# Patient Record
Sex: Male | Born: 1970
Health system: Southern US, Community
[De-identification: ages and names within clinical notes are randomized; demographics above are authoritative.]

## PROBLEM LIST (undated history)

## (undated) DIAGNOSIS — M1990 Unspecified osteoarthritis, unspecified site: Secondary | ICD-10-CM

## (undated) DIAGNOSIS — J45909 Unspecified asthma, uncomplicated: Secondary | ICD-10-CM

## (undated) DIAGNOSIS — E119 Type 2 diabetes mellitus without complications: Secondary | ICD-10-CM

## (undated) DIAGNOSIS — G4733 Obstructive sleep apnea (adult) (pediatric): Secondary | ICD-10-CM

## (undated) DIAGNOSIS — I10 Essential (primary) hypertension: Secondary | ICD-10-CM

## (undated) HISTORY — PX: HERNIA REPAIR: SHX51

---

## 1998-04-28 ENCOUNTER — Ambulatory Visit (HOSPITAL_COMMUNITY): Admission: RE | Admit: 1998-04-28 | Discharge: 1998-04-28 | Payer: Self-pay | Admitting: Sports Medicine

## 1998-05-12 ENCOUNTER — Ambulatory Visit (HOSPITAL_COMMUNITY): Admission: RE | Admit: 1998-05-12 | Discharge: 1998-05-12 | Payer: Self-pay | Admitting: Sports Medicine

## 1998-05-26 ENCOUNTER — Ambulatory Visit (HOSPITAL_COMMUNITY): Admission: RE | Admit: 1998-05-26 | Discharge: 1998-05-26 | Payer: Self-pay | Admitting: Sports Medicine

## 1999-03-03 ENCOUNTER — Emergency Department (HOSPITAL_COMMUNITY): Admission: EM | Admit: 1999-03-03 | Discharge: 1999-03-03 | Payer: Self-pay | Admitting: Emergency Medicine

## 1999-03-03 ENCOUNTER — Encounter: Payer: Self-pay | Admitting: Emergency Medicine

## 1999-04-04 ENCOUNTER — Emergency Department (HOSPITAL_COMMUNITY): Admission: EM | Admit: 1999-04-04 | Discharge: 1999-04-04 | Payer: Self-pay | Admitting: Emergency Medicine

## 2009-01-07 ENCOUNTER — Ambulatory Visit: Payer: Self-pay | Admitting: Emergency Medicine

## 2009-01-07 DIAGNOSIS — I10 Essential (primary) hypertension: Secondary | ICD-10-CM | POA: Insufficient documentation

## 2009-01-07 DIAGNOSIS — J45909 Unspecified asthma, uncomplicated: Secondary | ICD-10-CM | POA: Insufficient documentation

## 2009-01-21 ENCOUNTER — Ambulatory Visit: Payer: Self-pay | Admitting: Gastroenterology

## 2009-01-21 DIAGNOSIS — K219 Gastro-esophageal reflux disease without esophagitis: Secondary | ICD-10-CM | POA: Insufficient documentation

## 2009-01-21 DIAGNOSIS — E669 Obesity, unspecified: Secondary | ICD-10-CM

## 2009-01-21 DIAGNOSIS — R1319 Other dysphagia: Secondary | ICD-10-CM | POA: Insufficient documentation

## 2009-02-13 ENCOUNTER — Ambulatory Visit: Payer: Self-pay | Admitting: Emergency Medicine

## 2009-02-27 ENCOUNTER — Encounter (INDEPENDENT_AMBULATORY_CARE_PROVIDER_SITE_OTHER): Payer: Self-pay | Admitting: *Deleted

## 2009-02-27 ENCOUNTER — Ambulatory Visit: Payer: Self-pay | Admitting: Gastroenterology

## 2009-02-27 ENCOUNTER — Telehealth (INDEPENDENT_AMBULATORY_CARE_PROVIDER_SITE_OTHER): Payer: Self-pay | Admitting: *Deleted

## 2009-02-27 ENCOUNTER — Ambulatory Visit (HOSPITAL_COMMUNITY): Admission: RE | Admit: 2009-02-27 | Discharge: 2009-02-27 | Payer: Self-pay | Admitting: Gastroenterology

## 2009-02-27 ENCOUNTER — Encounter: Payer: Self-pay | Admitting: Gastroenterology

## 2009-02-28 ENCOUNTER — Encounter: Payer: Self-pay | Admitting: Gastroenterology

## 2009-05-14 ENCOUNTER — Ambulatory Visit: Payer: Self-pay | Admitting: Gastroenterology

## 2011-02-10 LAB — GLUCOSE, CAPILLARY: Glucose-Capillary: 91 mg/dL (ref 70–99)

## 2015-06-11 DIAGNOSIS — R0781 Pleurodynia: Secondary | ICD-10-CM | POA: Insufficient documentation

## 2015-06-11 DIAGNOSIS — J9601 Acute respiratory failure with hypoxia: Secondary | ICD-10-CM | POA: Insufficient documentation

## 2015-06-11 DIAGNOSIS — E871 Hypo-osmolality and hyponatremia: Secondary | ICD-10-CM

## 2015-06-11 DIAGNOSIS — E1165 Type 2 diabetes mellitus with hyperglycemia: Secondary | ICD-10-CM | POA: Insufficient documentation

## 2015-06-11 DIAGNOSIS — J189 Pneumonia, unspecified organism: Secondary | ICD-10-CM | POA: Insufficient documentation

## 2015-06-11 DIAGNOSIS — B9561 Methicillin susceptible Staphylococcus aureus infection as the cause of diseases classified elsewhere: Secondary | ICD-10-CM

## 2015-06-11 DIAGNOSIS — A419 Sepsis, unspecified organism: Secondary | ICD-10-CM | POA: Diagnosis present

## 2015-06-11 DIAGNOSIS — J45909 Unspecified asthma, uncomplicated: Secondary | ICD-10-CM | POA: Insufficient documentation

## 2015-06-13 DIAGNOSIS — Q2112 Patent foramen ovale: Secondary | ICD-10-CM | POA: Insufficient documentation

## 2015-06-16 DIAGNOSIS — J984 Other disorders of lung: Secondary | ICD-10-CM | POA: Insufficient documentation

## 2015-06-17 DIAGNOSIS — M25521 Pain in right elbow: Secondary | ICD-10-CM | POA: Insufficient documentation

## 2019-06-18 ENCOUNTER — Encounter (HOSPITAL_COMMUNITY): Payer: Self-pay

## 2019-06-18 ENCOUNTER — Other Ambulatory Visit: Payer: Self-pay

## 2019-06-18 ENCOUNTER — Emergency Department (HOSPITAL_COMMUNITY)
Admission: EM | Admit: 2019-06-18 | Discharge: 2019-06-18 | Disposition: A | Discharge status: Home / Self Care | Payer: Self-pay | Attending: Emergency Medicine | Admitting: Emergency Medicine

## 2019-06-18 ENCOUNTER — Emergency Department (HOSPITAL_COMMUNITY): Payer: Self-pay

## 2019-06-18 DIAGNOSIS — N3000 Acute cystitis without hematuria: Secondary | ICD-10-CM | POA: Insufficient documentation

## 2019-06-18 DIAGNOSIS — Z79899 Other long term (current) drug therapy: Secondary | ICD-10-CM | POA: Insufficient documentation

## 2019-06-18 DIAGNOSIS — M5416 Radiculopathy, lumbar region: Secondary | ICD-10-CM | POA: Insufficient documentation

## 2019-06-18 DIAGNOSIS — I10 Essential (primary) hypertension: Secondary | ICD-10-CM | POA: Insufficient documentation

## 2019-06-18 DIAGNOSIS — E1165 Type 2 diabetes mellitus with hyperglycemia: Secondary | ICD-10-CM | POA: Insufficient documentation

## 2019-06-18 DIAGNOSIS — Z880 Allergy status to penicillin: Secondary | ICD-10-CM | POA: Insufficient documentation

## 2019-06-18 DIAGNOSIS — R109 Unspecified abdominal pain: Secondary | ICD-10-CM

## 2019-06-18 DIAGNOSIS — R739 Hyperglycemia, unspecified: Secondary | ICD-10-CM

## 2019-06-18 HISTORY — DX: Essential (primary) hypertension: I10

## 2019-06-18 HISTORY — DX: Unspecified osteoarthritis, unspecified site: M19.90

## 2019-06-18 LAB — CBC
HCT: 42.3 % (ref 39.0–52.0)
Hemoglobin: 14 g/dL (ref 13.0–17.0)
MCH: 30.2 pg (ref 26.0–34.0)
MCHC: 33.1 g/dL (ref 30.0–36.0)
MCV: 91.2 fL (ref 80.0–100.0)
Platelets: 187 10*3/uL (ref 150–400)
RBC: 4.64 MIL/uL (ref 4.22–5.81)
RDW: 12.3 % (ref 11.5–15.5)
WBC: 10 10*3/uL (ref 4.0–10.5)
nRBC: 0 % (ref 0.0–0.2)

## 2019-06-18 LAB — URINALYSIS, ROUTINE W REFLEX MICROSCOPIC
Bilirubin Urine: NEGATIVE
Glucose, UA: >=500 mg/dL — AB
Ketones, ur: NEGATIVE mg/dL
Nitrite: NEGATIVE
Protein, ur: 100 mg/dL — AB
RBC / HPF: >50 RBC/hpf — ABNORMAL HIGH (ref 0–5)
Specific Gravity, Urine: 1.03 (ref 1.005–1.030)
WBC, UA: >50 WBC/hpf — ABNORMAL HIGH (ref 0–5)
pH: 5 (ref 5.0–8.0)

## 2019-06-18 LAB — CBG MONITORING, ED: Glucose-Capillary: 345 mg/dL — ABNORMAL HIGH (ref 70–99)

## 2019-06-18 LAB — BASIC METABOLIC PANEL
Anion gap: 12 (ref 5–15)
BUN: 17 mg/dL (ref 6–20)
CO2: 26 mmol/L (ref 22–32)
Calcium: 9.8 mg/dL (ref 8.9–10.3)
Chloride: 93 mmol/L — ABNORMAL LOW (ref 98–111)
Creatinine, Ser: 0.91 mg/dL (ref 0.61–1.24)
GFR calc Af Amer: >60 mL/min (ref >60)
GFR calc non Af Amer: >60 mL/min (ref >60)
Glucose, Bld: 455 mg/dL — ABNORMAL HIGH (ref 70–99)
Potassium: 4.5 mmol/L (ref 3.5–5.1)
Sodium: 131 mmol/L — ABNORMAL LOW (ref 135–145)

## 2019-06-18 MED ORDER — ACETAMINOPHEN 325 MG PO TABS
650.0000 mg | ORAL_TABLET | Freq: Once | ORAL | Status: AC
  Administered 2019-06-18: 650 mg via ORAL
  Filled 2019-06-18: qty 2

## 2019-06-18 MED ORDER — SODIUM CHLORIDE 0.9 % IV BOLUS: 1000.0000 mL | Freq: Once | INTRAVENOUS | Status: DC

## 2019-06-18 MED ORDER — BLOOD GLUCOSE MONITOR KIT: PACK | 0 refills | Status: DC

## 2019-06-18 MED ORDER — SODIUM CHLORIDE 0.9 % IV BOLUS
1000.0000 mL | Freq: Once | INTRAVENOUS | Status: AC
  Administered 2019-06-18: 17:00:00 1000 mL via INTRAVENOUS

## 2019-06-18 MED ORDER — CIPROFLOXACIN HCL 500 MG PO TABS: 500.0000 mg | ORAL_TABLET | Freq: Two times a day (BID) | ORAL | 0 refills | Status: AC

## 2019-06-18 MED ORDER — METFORMIN HCL 500 MG PO TABS: 500.0000 mg | ORAL_TABLET | Freq: Two times a day (BID) | ORAL | 0 refills | Status: DC

## 2019-06-18 MED ORDER — CIPROFLOXACIN IN D5W 400 MG/200ML IV SOLN
400.0000 mg | Freq: Once | INTRAVENOUS | Status: AC
  Administered 2019-06-18: 400 mg via INTRAVENOUS
  Filled 2019-06-18: qty 200

## 2019-06-18 NOTE — ED Triage Notes (Signed)
Pt endorses flank pain, blood in urine and was seen Saturday and [facility] Saturday and given antibiotics and told if not better today to go to the ER. Pt was fine until this morning and began having flank pain radiating down the right leg. VSS.

## 2019-06-18 NOTE — ED Provider Notes (Signed)
[facility]ENC[facility] DEPARTMENT Provider Note   CSN: [id number] Arrival date & time: 06/18/19  1141    History   Chief Complaint Chief Complaint  Patient presents with  . Flank Pain    HPI Bradley Cox is a 48 y.o. male.     48 year old male with history of kidney stones and hypertension presents with complaint of right low back pain.  Patient states that he developed hematuria on Saturday, went to see his PCP and was given a pain shot, hematuria then resolved.  Patient woke up at 2:00 this morning with pain in his right lower back, radiates to his right knee, worse with movement.  Denies abdominal pain, leg weakness, groin numbness.  Denies any falls or injuries.  No other complaints or concerns today.     Past Medical History:  Diagnosis Date  . Arthritis   . Hypertension     Patient Active Problem List   Diagnosis Date Noted  . OBESITY 01/21/2009  . GERD 01/21/2009  . DYSPHAGIA 01/21/2009  . HYPERTENSION 01/07/2009  . ASTHMA 01/07/2009    History reviewed. No pertinent surgical history.      Home Medications    Prior to Admission medications   Medication Sig Start Date End Date Taking? Authorizing Provider  albuterol (VENTOLIN HFA) 108 (90 Base) MCG/ACT inhaler Inhale 1 puff into the lungs as needed for wheezing or shortness of breath.   Yes [provider]  hydrALAZINE (APRESOLINE) 50 MG tablet Take 50 mg by mouth 2 (two) times daily. 06/21/15  Yes [provider]  ibuprofen (ADVIL) 200 MG tablet Take 600 mg by mouth every 6 (six) hours as needed for headache or moderate pain.   Yes [provider]  Multiple Vitamin (THERA) TABS Take 2 tablets by mouth daily.   Yes [provider]  blood glucose meter kit and supplies KIT Dispense based on patient and insurance preference. Use up to four times daily as directed. (FOR ICD-9 250.00, 250.01). 06/18/19   Tacy Learn, PA-C  ciprofloxacin (CIPRO) 500 MG  tablet Take 1 tablet (500 mg total) by mouth every 12 (twelve) hours for 7 days. 06/18/19 06/25/19  Tacy Learn, PA-C  metFORMIN (GLUCOPHAGE) 500 MG tablet Take 1 tablet (500 mg total) by mouth 2 (two) times daily with a meal. 06/18/19 07/18/19  Tacy Learn, PA-C    Family History History reviewed. No pertinent family history.  Social History Social History   Tobacco Use  . Smoking status: Never Smoker  . Smokeless tobacco: Current User    Types: Snuff  Substance Use Topics  . Alcohol use: Yes    Comment: rarely  . Drug use: Never     Allergies   Penicillins   Review of Systems Review of Systems  Constitutional: Negative for chills and fever.  Gastrointestinal: Negative for abdominal pain, constipation, diarrhea, nausea and vomiting.  Genitourinary: Positive for hematuria. Negative for dysuria, frequency and urgency.  Musculoskeletal: Positive for back pain.  Skin: Negative for rash and wound.  Allergic/Immunologic: Negative for immunocompromised state.  Neurological: Negative for weakness.  Psychiatric/Behavioral: Negative for confusion.  All other systems reviewed and are negative.    Physical Exam Updated Vital Signs BP (!) 160/100   Pulse (!) 110   Temp 100.1 F (37.8 C) (Oral)   Resp 18   SpO2 93%   Physical Exam Vitals signs and nursing note reviewed.  Constitutional:      General: He is not in acute  distress.    Appearance: He is well-developed. He is obese. He is not diaphoretic.  HENT:     Head: Normocephalic and atraumatic.  Cardiovascular:     Rate and Rhythm: Normal rate and regular rhythm.     Pulses: Normal pulses.     Heart sounds: Normal heart sounds.  Pulmonary:     Effort: Pulmonary effort is normal.     Breath sounds: Normal breath sounds.  Abdominal:     Tenderness: There is no abdominal tenderness. There is no right CVA tenderness or left CVA tenderness.  Musculoskeletal:        General: Tenderness present.       Back:      Right lower leg: No edema.     Left lower leg: No edema.  Skin:    General: Skin is warm and dry.     Findings: No erythema or rash.  Neurological:     Mental Status: He is alert and oriented to person, place, and time.     Sensory: Sensation is intact. No sensory deficit.     Gait: Gait normal.     Deep Tendon Reflexes: Reflexes normal.     Reflex Scores:      Patellar reflexes are 1+ on the right side and 1+ on the left side. Psychiatric:        Behavior: Behavior normal.      ED Treatments / Results  Labs (all labs ordered are listed, but only abnormal results are displayed) Labs Reviewed  URINALYSIS, ROUTINE W REFLEX MICROSCOPIC - Abnormal; Notable for the following components:      Result Value   Color, Urine AMBER (*)    APPearance CLOUDY (*)    Glucose, UA >=500 (*)    Hgb urine dipstick LARGE (*)    Protein, ur 100 (*)    Leukocytes,Ua LARGE (*)    RBC / HPF >50 (*)    WBC, UA >50 (*)    Bacteria, UA RARE (*)    All other components within normal limits  BASIC METABOLIC PANEL - Abnormal; Notable for the following components:   Sodium 131 (*)    Chloride 93 (*)    Glucose, Bld 455 (*)    All other components within normal limits  CBG MONITORING, ED - Abnormal; Notable for the following components:   Glucose-Capillary 345 (*)    All other components within normal limits  URINE CULTURE  CBC    EKG None  Radiology Ct Renal Stone Study  Result Date: 06/18/2019 CLINICAL DATA:  Right flank pain radiating into right leg. Began Friday. Also having hematuria EXAM: CT ABDOMEN AND PELVIS WITHOUT CONTRAST TECHNIQUE: Multidetector CT imaging of the abdomen and pelvis was performed following the standard protocol without IV contrast. COMPARISON:  None. FINDINGS: Lower chest: Minimal scarring at both lung bases. There is coronary artery calcification of the visualized portion of the heart. Hepatobiliary: Liver is diffusely low attenuation, consistent with hepatic  steatosis. Rounded contour the liver and prominent caudate lobe raise the question of cirrhosis. The gallbladder is contracted and contains numerous calcifications, likely representing calcified stones. Pancreas: Unremarkable. No pancreatic ductal dilatation or surrounding inflammatory changes. Spleen: Normal in size without focal abnormality. Adrenals/Urinary Tract: There are 2 intrarenal calculi in the mid and LOWER pole regions of the RIGHT kidney, largest measuring 0.6 centimeters. The LEFT kidney is unremarkable. There is mild stranding surrounding the RIGHT ureter, not associated with obstruction or ureteral stone. The LEFT ureter is unremarkable. Visualized portion  of the urinary bladder is unremarkable. Stomach/Bowel: The stomach and small bowel loops are normal in appearance. There are numerous colonic diverticula but no acute diverticulitis. Average stool burden. Normal appearance of the appendix. Vascular/Lymphatic: No significant vascular findings are present. No enlarged abdominal or pelvic lymph nodes. Reproductive: Prostate is unremarkable. Other: Fat containing bilateral inguinal hernias, RIGHT greater than LEFT. Musculoskeletal: Degenerative changes in the thoracic and lumbar spine. IMPRESSION: 1. Nonobstructing RIGHT intrarenal calculi. 2. Mild stranding surrounding the RIGHT ureter, without ureteral stone raising the question of a recently passed ureteral calculus. No bladder calculi identified. 3. Hepatic steatosis and possible cirrhosis. 4. Cholelithiasis.  Contracted gallbladder. 5. Colonic diverticulosis. 6. Fat containing bilateral inguinal hernias, RIGHT greater than LEFT. 7. Coronary artery disease. Electronically Signed   By: Nolon Nations M.D.   On: 06/18/2019 15:39    Procedures Procedures (including critical care time)  Medications Ordered in ED Medications  sodium chloride 0.9 % bolus 1,000 mL (has no administration in time range)  acetaminophen (TYLENOL) tablet 650 mg (650  mg Oral Given 06/18/19 1159)  sodium chloride 0.9 % bolus 1,000 mL (1,000 mLs Intravenous New Bag/Given 06/18/19 1638)  ciprofloxacin (CIPRO) IVPB 400 mg (400 mg Intravenous New Bag/Given 06/18/19 1640)     Initial Impression / Assessment and Plan / ED Course  I have reviewed the triage vital signs and the nursing notes.  Pertinent labs & imaging results that were available during my care of the patient were reviewed by me and considered in my medical decision making (see chart for details).  Clinical Course as of Jun 18 1943  Mon Jun 17, 6454  5549 48 year old male complaining of some right flank pain.  He was formally a diabetic although it sounds like he had it under control with dietary measures here with an elevated blood sugar 455.  Rest of his labs are fairly unremarkable the urinalysis is pending.  CT shows some inflammation in the right ureter quite possibly a passed stone.   [MB]  6665 48 year old male with complaint of right low back pain which radiates to his leg, pain reproduced with palpation.  Patient has a history of prior kidney stones, states this feels different however does report hematuria at onset.  Review of labs, glucose is 455, patient given IV fluids with improvement to 345, plan for additional IV fluids prior to discharge.  CBC is unremarkable, urinalysis with likely urinary tract infection, large hemoglobin, protein, leukocytes with greater than 50 white cells.  Sent for culture.  Patient be treated with Cipro.  Patient's triage temp was 100.1, repeat temp of 99.0.  Reviewed CT results with patient, recommend follow-up with PCP, discussed hepatic steatosis, cholelithiasis, renal stones.  Stranding on the ureter suggesting recently passed stone.  Patient given prescription for metformin and glucometer, reports his father has diabetes, he is familiar with tracking blood sugar.  Patient is to record blood sugar and take to PCP for follow-up.   [LM]    Clinical Course User  Index [LM] Tacy Learn, PA-C [MB] Hayden Rasmussen, MD      Final Clinical Impressions(s) / ED Diagnoses   Final diagnoses:  Flank pain  Radiculopathy of lumbar region  Hyperglycemia  Acute cystitis without hematuria    ED Discharge Orders         Ordered    metFORMIN (GLUCOPHAGE) 500 MG tablet  2 times daily with meals     06/18/19 1930    blood glucose meter kit and supplies KIT  06/18/19 1930    ciprofloxacin (CIPRO) 500 MG tablet  Every 12 hours     06/18/19 1930           Tacy Learn, PA-C 06/18/19 1944    Hayden Rasmussen, MD 06/19/19 [phone]

## 2019-06-18 NOTE — Discharge Instructions (Signed)
Take Cipro as prescribed and complete the full course.  Recheck with your doctor for repeat urinalysis. Take metformin twice daily as prescribed.  Monitor your blood sugar, take blood sugar log with you to your PCP for follow-up.  Be sure to drink plenty of water every day.

## 2019-06-21 LAB — URINE CULTURE: Culture: >=100000 — AB

## 2019-06-22 ENCOUNTER — Telehealth: Payer: Self-pay | Admitting: *Deleted

## 2019-06-22 NOTE — Telephone Encounter (Signed)
Post ED Visit - Positive Culture Follow-up  Culture report reviewed by antimicrobial stewardship pharmacist: [facility] Team []  Elenor Quinones, Pharm.D. []  Heide Guile, Pharm.D., BCPS AQ-ID []  Parks Neptune, Pharm.D., BCPS []  Alycia Rossetti, Pharm.D., BCPS []  [place], Pharm.D., BCPS, AAHIVP []  Legrand Como, Pharm.D., BCPS, AAHIVP []  Salome Arnt, PharmD, BCPS []  Johnnette Gourd, PharmD, BCPS []  Hughes Better, PharmD, BCPS []  Leeroy Cha, PharmD []  Laqueta Linden, PharmD, BCPS []  Albertina Parr, PharmD  [place] Team []  Leodis Sias, PharmD []  Lindell Spar, PharmD []  Royetta Asal, PharmD []  Graylin Shiver, Rph []  Rema Fendt) Glennon Mac, PharmD []  Arlyn Dunning, PharmD []  Netta Cedars, PharmD []  Dia Sitter, PharmD []  Leone Haven, PharmD []  Gretta Arab, PharmD []  Theodis Shove, PharmD []  Peggyann Juba, PharmD []  Reuel Boom, PharmD   Positive urine culture Treated with Ciprofloxacin, organism sensitive to the same and no further patient follow-up is required at this time.  Recommended f/u with PCP in one week. Harlon Flor [facility] 06/22/2019, 10:06 AM

## 2019-07-05 ENCOUNTER — Other Ambulatory Visit: Payer: Self-pay

## 2019-07-05 ENCOUNTER — Inpatient Hospital Stay (HOSPITAL_COMMUNITY)
Admission: EM | Admit: 2019-07-05 | Discharge: 2019-08-16 | DRG: 853 | Disposition: A | Discharge status: Rehabilitation Facility | Payer: Self-pay | Attending: Internal Medicine | Admitting: Internal Medicine

## 2019-07-05 DIAGNOSIS — E1110 Type 2 diabetes mellitus with ketoacidosis without coma: Secondary | ICD-10-CM | POA: Diagnosis present

## 2019-07-05 DIAGNOSIS — L0231 Cutaneous abscess of buttock: Secondary | ICD-10-CM | POA: Diagnosis present

## 2019-07-05 DIAGNOSIS — Z8249 Family history of ischemic heart disease and other diseases of the circulatory system: Secondary | ICD-10-CM

## 2019-07-05 DIAGNOSIS — J45909 Unspecified asthma, uncomplicated: Secondary | ICD-10-CM | POA: Diagnosis present

## 2019-07-05 DIAGNOSIS — K760 Fatty (change of) liver, not elsewhere classified: Secondary | ICD-10-CM | POA: Diagnosis present

## 2019-07-05 DIAGNOSIS — G061 Intraspinal abscess and granuloma: Secondary | ICD-10-CM | POA: Diagnosis present

## 2019-07-05 DIAGNOSIS — D638 Anemia in other chronic diseases classified elsewhere: Secondary | ICD-10-CM | POA: Diagnosis not present

## 2019-07-05 DIAGNOSIS — Z87442 Personal history of urinary calculi: Secondary | ICD-10-CM

## 2019-07-05 DIAGNOSIS — Z720 Tobacco use: Secondary | ICD-10-CM

## 2019-07-05 DIAGNOSIS — K311 Adult hypertrophic pyloric stenosis: Secondary | ICD-10-CM

## 2019-07-05 DIAGNOSIS — M549 Dorsalgia, unspecified: Secondary | ICD-10-CM

## 2019-07-05 DIAGNOSIS — E871 Hypo-osmolality and hyponatremia: Secondary | ICD-10-CM | POA: Diagnosis not present

## 2019-07-05 DIAGNOSIS — N179 Acute kidney failure, unspecified: Secondary | ICD-10-CM | POA: Diagnosis present

## 2019-07-05 DIAGNOSIS — E11649 Type 2 diabetes mellitus with hypoglycemia without coma: Secondary | ICD-10-CM | POA: Diagnosis not present

## 2019-07-05 DIAGNOSIS — Z833 Family history of diabetes mellitus: Secondary | ICD-10-CM

## 2019-07-05 DIAGNOSIS — E875 Hyperkalemia: Secondary | ICD-10-CM | POA: Diagnosis present

## 2019-07-05 DIAGNOSIS — Z6841 Body Mass Index (BMI) 40.0 and over, adult: Secondary | ICD-10-CM

## 2019-07-05 DIAGNOSIS — M4620 Osteomyelitis of vertebra, site unspecified: Secondary | ICD-10-CM

## 2019-07-05 DIAGNOSIS — A419 Sepsis, unspecified organism: Secondary | ICD-10-CM | POA: Diagnosis present

## 2019-07-05 DIAGNOSIS — B9561 Methicillin susceptible Staphylococcus aureus infection as the cause of diseases classified elsewhere: Secondary | ICD-10-CM

## 2019-07-05 DIAGNOSIS — G5701 Lesion of sciatic nerve, right lower limb: Secondary | ICD-10-CM | POA: Diagnosis not present

## 2019-07-05 DIAGNOSIS — R112 Nausea with vomiting, unspecified: Secondary | ICD-10-CM

## 2019-07-05 DIAGNOSIS — Z7984 Long term (current) use of oral hypoglycemic drugs: Secondary | ICD-10-CM

## 2019-07-05 DIAGNOSIS — K567 Ileus, unspecified: Secondary | ICD-10-CM | POA: Diagnosis not present

## 2019-07-05 DIAGNOSIS — D62 Acute posthemorrhagic anemia: Secondary | ICD-10-CM | POA: Diagnosis not present

## 2019-07-05 DIAGNOSIS — Z20828 Contact with and (suspected) exposure to other viral communicable diseases: Secondary | ICD-10-CM | POA: Diagnosis present

## 2019-07-05 DIAGNOSIS — I10 Essential (primary) hypertension: Secondary | ICD-10-CM | POA: Diagnosis present

## 2019-07-05 DIAGNOSIS — R109 Unspecified abdominal pain: Secondary | ICD-10-CM

## 2019-07-05 DIAGNOSIS — K219 Gastro-esophageal reflux disease without esophagitis: Secondary | ICD-10-CM | POA: Diagnosis present

## 2019-07-05 DIAGNOSIS — E1310 Other specified diabetes mellitus with ketoacidosis without coma: Secondary | ICD-10-CM

## 2019-07-05 DIAGNOSIS — L259 Unspecified contact dermatitis, unspecified cause: Secondary | ICD-10-CM | POA: Diagnosis not present

## 2019-07-05 DIAGNOSIS — L0291 Cutaneous abscess, unspecified: Secondary | ICD-10-CM | POA: Diagnosis present

## 2019-07-05 DIAGNOSIS — E878 Other disorders of electrolyte and fluid balance, not elsewhere classified: Secondary | ICD-10-CM | POA: Diagnosis not present

## 2019-07-05 DIAGNOSIS — K6812 Psoas muscle abscess: Secondary | ICD-10-CM | POA: Diagnosis present

## 2019-07-05 DIAGNOSIS — Z885 Allergy status to narcotic agent status: Secondary | ICD-10-CM

## 2019-07-05 DIAGNOSIS — M1990 Unspecified osteoarthritis, unspecified site: Secondary | ICD-10-CM | POA: Diagnosis present

## 2019-07-05 DIAGNOSIS — A4101 Sepsis due to Methicillin susceptible Staphylococcus aureus: Principal | ICD-10-CM | POA: Diagnosis present

## 2019-07-05 DIAGNOSIS — E876 Hypokalemia: Secondary | ICD-10-CM | POA: Diagnosis not present

## 2019-07-05 DIAGNOSIS — I82409 Acute embolism and thrombosis of unspecified deep veins of unspecified lower extremity: Secondary | ICD-10-CM | POA: Diagnosis not present

## 2019-07-05 DIAGNOSIS — L03115 Cellulitis of right lower limb: Secondary | ICD-10-CM | POA: Diagnosis not present

## 2019-07-05 DIAGNOSIS — G4733 Obstructive sleep apnea (adult) (pediatric): Secondary | ICD-10-CM | POA: Diagnosis present

## 2019-07-05 DIAGNOSIS — K7460 Unspecified cirrhosis of liver: Secondary | ICD-10-CM | POA: Diagnosis present

## 2019-07-05 DIAGNOSIS — Z880 Allergy status to penicillin: Secondary | ICD-10-CM

## 2019-07-05 DIAGNOSIS — R7881 Bacteremia: Secondary | ICD-10-CM

## 2019-07-05 DIAGNOSIS — L02415 Cutaneous abscess of right lower limb: Secondary | ICD-10-CM | POA: Diagnosis present

## 2019-07-05 DIAGNOSIS — Z0189 Encounter for other specified special examinations: Secondary | ICD-10-CM

## 2019-07-05 DIAGNOSIS — M009 Pyogenic arthritis, unspecified: Secondary | ICD-10-CM | POA: Diagnosis present

## 2019-07-05 DIAGNOSIS — N400 Enlarged prostate without lower urinary tract symptoms: Secondary | ICD-10-CM | POA: Diagnosis present

## 2019-07-05 HISTORY — DX: Obstructive sleep apnea (adult) (pediatric): G47.33

## 2019-07-05 HISTORY — DX: Morbid (severe) obesity due to excess calories: E66.01

## 2019-07-05 HISTORY — DX: Unspecified asthma, uncomplicated: J45.909

## 2019-07-05 HISTORY — DX: Type 2 diabetes mellitus without complications: E11.9

## 2019-07-05 MED ORDER — OXYCODONE-ACETAMINOPHEN 5-325 MG PO TABS
2.0000 | ORAL_TABLET | Freq: Once | ORAL | Status: DC
  Filled 2019-07-05: qty 2

## 2019-07-05 MED ORDER — OXYCODONE-ACETAMINOPHEN 5-325 MG PO TABS
2.0000 | ORAL_TABLET | Freq: Once | ORAL | Status: DC
  Administered 2019-07-05: 2 via ORAL

## 2019-07-05 MED ORDER — ONDANSETRON 4 MG PO TBDP
4.0000 mg | ORAL_TABLET | Freq: Once | ORAL | Status: DC
  Administered 2019-07-05: 4 mg via ORAL

## 2019-07-05 MED ORDER — ONDANSETRON 4 MG PO TBDP
4.0000 mg | ORAL_TABLET | Freq: Once | ORAL | Status: DC
  Filled 2019-07-05: qty 1

## 2019-07-05 NOTE — ED Notes (Signed)
Pt states he received a pain injection 2 weeks ago during his follow up appointment his MD states they injected him in his sciatica nerve. Pt also seen here for the same.

## 2019-07-05 NOTE — ED Triage Notes (Signed)
Pt presents with Right back pain radiating down his Right leg x3 weeks.

## 2019-07-06 ENCOUNTER — Emergency Department (HOSPITAL_COMMUNITY): Payer: Self-pay

## 2019-07-06 ENCOUNTER — Encounter (HOSPITAL_COMMUNITY): Payer: Self-pay | Admitting: Internal Medicine

## 2019-07-06 ENCOUNTER — Inpatient Hospital Stay (HOSPITAL_COMMUNITY): Payer: Self-pay

## 2019-07-06 DIAGNOSIS — E875 Hyperkalemia: Secondary | ICD-10-CM | POA: Diagnosis present

## 2019-07-06 DIAGNOSIS — N179 Acute kidney failure, unspecified: Secondary | ICD-10-CM

## 2019-07-06 DIAGNOSIS — A419 Sepsis, unspecified organism: Secondary | ICD-10-CM

## 2019-07-06 DIAGNOSIS — L0291 Cutaneous abscess, unspecified: Secondary | ICD-10-CM | POA: Diagnosis present

## 2019-07-06 LAB — BLOOD CULTURE ID PANEL (REFLEXED)

## 2019-07-06 LAB — CBC WITH DIFFERENTIAL/PLATELET
Abs Immature Granulocytes: 0.05 10*3/uL (ref 0.00–0.07)
Basophils Absolute: 0.1 10*3/uL (ref 0.0–0.1)
Basophils Relative: 1 %
Eosinophils Absolute: 0 10*3/uL (ref 0.0–0.5)
Eosinophils Relative: 0 %
HCT: 42.6 % (ref 39.0–52.0)
Hemoglobin: 14 g/dL (ref 13.0–17.0)
Immature Granulocytes: 1 %
Lymphocytes Relative: 8 %
Lymphs Abs: 0.8 10*3/uL (ref 0.7–4.0)
MCH: 31 pg (ref 26.0–34.0)
MCHC: 32.9 g/dL (ref 30.0–36.0)
MCV: 94.2 fL (ref 80.0–100.0)
Monocytes Absolute: 1 10*3/uL (ref 0.1–1.0)
Monocytes Relative: 9 %
Neutro Abs: 8.6 10*3/uL — ABNORMAL HIGH (ref 1.7–7.7)
Neutrophils Relative %: 81 %
Platelets: 256 10*3/uL (ref 150–400)
RBC: 4.52 MIL/uL (ref 4.22–5.81)
RDW: 12.9 % (ref 11.5–15.5)
WBC: 10.4 10*3/uL (ref 4.0–10.5)
nRBC: 0 % (ref 0.0–0.2)

## 2019-07-06 LAB — LACTIC ACID, PLASMA
Lactic Acid, Venous: 3 mmol/L (ref 0.5–1.9)
Lactic Acid, Venous: 2.2 mmol/L (ref 0.5–1.9)
Lactic Acid, Venous: 2.6 mmol/L (ref 0.5–1.9)

## 2019-07-06 LAB — I-STAT CHEM 8, ED
BUN: 17 mg/dL (ref 6–20)
Calcium, Ion: 1.16 mmol/L (ref 1.15–1.40)
Chloride: 96 mmol/L — ABNORMAL LOW (ref 98–111)
Creatinine, Ser: 0.6 mg/dL — ABNORMAL LOW (ref 0.61–1.24)
Glucose, Bld: 338 mg/dL — ABNORMAL HIGH (ref 70–99)
HCT: 37 % — ABNORMAL LOW (ref 39.0–52.0)
Hemoglobin: 12.6 g/dL — ABNORMAL LOW (ref 13.0–17.0)
Potassium: 4.6 mmol/L (ref 3.5–5.1)
Sodium: 132 mmol/L — ABNORMAL LOW (ref 135–145)
TCO2: 25 mmol/L (ref 22–32)

## 2019-07-06 LAB — GLUCOSE, CAPILLARY
Glucose-Capillary: 137 mg/dL — ABNORMAL HIGH (ref 70–99)
Glucose-Capillary: 141 mg/dL — ABNORMAL HIGH (ref 70–99)
Glucose-Capillary: 161 mg/dL — ABNORMAL HIGH (ref 70–99)

## 2019-07-06 LAB — BASIC METABOLIC PANEL
Anion gap: 11 (ref 5–15)
BUN: 14 mg/dL (ref 6–20)
CO2: 26 mmol/L (ref 22–32)
Calcium: 8.4 mg/dL — ABNORMAL LOW (ref 8.9–10.3)
Chloride: 95 mmol/L — ABNORMAL LOW (ref 98–111)
Creatinine, Ser: 0.75 mg/dL (ref 0.61–1.24)
GFR calc Af Amer: >60 mL/min (ref >60)
GFR calc non Af Amer: >60 mL/min (ref >60)
Glucose, Bld: 107 mg/dL — ABNORMAL HIGH (ref 70–99)
Potassium: 4.8 mmol/L (ref 3.5–5.1)
Sodium: 132 mmol/L — ABNORMAL LOW (ref 135–145)

## 2019-07-06 LAB — URINALYSIS, ROUTINE W REFLEX MICROSCOPIC
Bilirubin Urine: NEGATIVE
Glucose, UA: >=500 mg/dL — AB
Ketones, ur: 80 mg/dL — AB
Leukocytes,Ua: NEGATIVE
Nitrite: NEGATIVE
Protein, ur: NEGATIVE mg/dL
Specific Gravity, Urine: 1.024 (ref 1.005–1.030)
pH: 5 (ref 5.0–8.0)

## 2019-07-06 LAB — COMPREHENSIVE METABOLIC PANEL
ALT: 19 U/L (ref 0–44)
AST: 25 U/L (ref 15–41)
Albumin: 1.9 g/dL — ABNORMAL LOW (ref 3.5–5.0)
Alkaline Phosphatase: 84 U/L (ref 38–126)
Anion gap: 18 — ABNORMAL HIGH (ref 5–15)
BUN: 21 mg/dL — ABNORMAL HIGH (ref 6–20)
CO2: 21 mmol/L — ABNORMAL LOW (ref 22–32)
Calcium: 9.2 mg/dL (ref 8.9–10.3)
Chloride: 88 mmol/L — ABNORMAL LOW (ref 98–111)
Creatinine, Ser: 1.36 mg/dL — ABNORMAL HIGH (ref 0.61–1.24)
GFR calc Af Amer: >60 mL/min (ref >60)
GFR calc non Af Amer: >60 mL/min (ref >60)
Glucose, Bld: 524 mg/dL (ref 70–99)
Potassium: 6.1 mmol/L — ABNORMAL HIGH (ref 3.5–5.1)
Sodium: 127 mmol/L — ABNORMAL LOW (ref 135–145)
Total Bilirubin: 1 mg/dL (ref 0.3–1.2)
Total Protein: 7.7 g/dL (ref 6.5–8.1)

## 2019-07-06 LAB — CBG MONITORING, ED
Glucose-Capillary: 420 mg/dL — ABNORMAL HIGH (ref 70–99)
Glucose-Capillary: 370 mg/dL — ABNORMAL HIGH (ref 70–99)
Glucose-Capillary: 300 mg/dL — ABNORMAL HIGH (ref 70–99)
Glucose-Capillary: 311 mg/dL — ABNORMAL HIGH (ref 70–99)
Glucose-Capillary: 255 mg/dL — ABNORMAL HIGH (ref 70–99)
Glucose-Capillary: 197 mg/dL — ABNORMAL HIGH (ref 70–99)

## 2019-07-06 LAB — C-REACTIVE PROTEIN: CRP: 43.5 mg/dL — ABNORMAL HIGH (ref <1.0)

## 2019-07-06 LAB — SARS CORONAVIRUS 2 BY RT PCR (HOSPITAL ORDER, PERFORMED IN ~~LOC~~ HOSPITAL LAB): SARS Coronavirus 2: NEGATIVE

## 2019-07-06 MED ORDER — SODIUM CHLORIDE 0.9 % IV SOLN
2.0000 g | Freq: Three times a day (TID) | INTRAVENOUS | Status: DC
  Administered 2019-07-06 – 2019-07-07 (×2): 2 g via INTRAVENOUS
  Filled 2019-07-06 (×4): qty 2

## 2019-07-06 MED ORDER — ACETAMINOPHEN 500 MG PO TABS
1000.0000 mg | ORAL_TABLET | Freq: Once | ORAL | Status: AC
  Administered 2019-07-06: 1000 mg via ORAL
  Filled 2019-07-06: qty 2

## 2019-07-06 MED ORDER — FENTANYL CITRATE (PF) 100 MCG/2ML IJ SOLN
INTRAMUSCULAR | Status: AC | PRN
  Administered 2019-07-06 (×2): 25 ug via INTRAVENOUS
  Administered 2019-07-06: 50 ug via INTRAVENOUS

## 2019-07-06 MED ORDER — SODIUM CHLORIDE 0.9 % IV BOLUS (SEPSIS)
800.0000 mL | Freq: Once | INTRAVENOUS | Status: AC
  Administered 2019-07-06: 800 mL via INTRAVENOUS

## 2019-07-06 MED ORDER — ALBUTEROL SULFATE (2.5 MG/3ML) 0.083% IN NEBU: 2.5000 mg | INHALATION_SOLUTION | Freq: Four times a day (QID) | RESPIRATORY_TRACT | Status: DC | PRN

## 2019-07-06 MED ORDER — FENTANYL CITRATE (PF) 100 MCG/2ML IJ SOLN
INTRAMUSCULAR | Status: AC
  Filled 2019-07-06: qty 4

## 2019-07-06 MED ORDER — ONDANSETRON HCL 4 MG PO TABS: 4.0000 mg | ORAL_TABLET | Freq: Four times a day (QID) | ORAL | Status: DC | PRN

## 2019-07-06 MED ORDER — SODIUM CHLORIDE 0.9 % IV BOLUS (SEPSIS)
1000.0000 mL | Freq: Once | INTRAVENOUS | Status: AC
  Administered 2019-07-06: 1000 mL via INTRAVENOUS

## 2019-07-06 MED ORDER — SODIUM CHLORIDE 0.9 % IV SOLN
INTRAVENOUS | Status: AC | PRN
  Administered 2019-07-06: 10 mL/h via INTRAVENOUS

## 2019-07-06 MED ORDER — CLINDAMYCIN PHOSPHATE 600 MG/50ML IV SOLN: 600.0000 mg | Freq: Once | INTRAVENOUS | Status: DC

## 2019-07-06 MED ORDER — ACETAMINOPHEN 325 MG PO TABS: 650.0000 mg | ORAL_TABLET | Freq: Four times a day (QID) | ORAL | Status: DC | PRN

## 2019-07-06 MED ORDER — ONDANSETRON HCL 4 MG/2ML IJ SOLN
4.0000 mg | Freq: Four times a day (QID) | INTRAMUSCULAR | Status: DC | PRN
  Administered 2019-07-11: 21:00:00 4 mg via INTRAVENOUS
  Filled 2019-07-06: qty 2

## 2019-07-06 MED ORDER — VANCOMYCIN HCL 10 G IV SOLR
1500.0000 mg | Freq: Two times a day (BID) | INTRAVENOUS | Status: DC
  Administered 2019-07-07: 1500 mg via INTRAVENOUS
  Filled 2019-07-06 (×3): qty 1500

## 2019-07-06 MED ORDER — LORAZEPAM 2 MG/ML IJ SOLN
1.0000 mg | Freq: Once | INTRAMUSCULAR | Status: AC
  Administered 2019-07-06: 1 mg via INTRAVENOUS
  Filled 2019-07-06: qty 1

## 2019-07-06 MED ORDER — SODIUM CHLORIDE 0.9% FLUSH
3.0000 mL | Freq: Two times a day (BID) | INTRAVENOUS | Status: DC
  Administered 2019-07-07 – 2019-07-27 (×27): 3 mL via INTRAVENOUS

## 2019-07-06 MED ORDER — SODIUM CHLORIDE 0.9 % IV SOLN
2.0000 g | Freq: Once | INTRAVENOUS | Status: AC
  Administered 2019-07-06: 2 g via INTRAVENOUS
  Filled 2019-07-06: qty 2

## 2019-07-06 MED ORDER — ONDANSETRON HCL 4 MG/2ML IJ SOLN
4.0000 mg | Freq: Once | INTRAMUSCULAR | Status: AC
  Administered 2019-07-06: 4 mg via INTRAVENOUS
  Filled 2019-07-06: qty 2

## 2019-07-06 MED ORDER — SODIUM CHLORIDE 0.9 % IV SOLN
INTRAVENOUS | Status: DC
  Administered 2019-07-06: 23:00:00 via INTRAVENOUS

## 2019-07-06 MED ORDER — HYDROMORPHONE HCL 1 MG/ML IJ SOLN
2.0000 mg | Freq: Once | INTRAMUSCULAR | Status: AC
  Administered 2019-07-06: 2 mg via INTRAVENOUS
  Filled 2019-07-06: qty 2

## 2019-07-06 MED ORDER — ENOXAPARIN SODIUM 40 MG/0.4ML ~~LOC~~ SOLN
40.0000 mg | SUBCUTANEOUS | Status: DC
  Administered 2019-07-07: 40 mg via SUBCUTANEOUS
  Filled 2019-07-06: qty 0.4

## 2019-07-06 MED ORDER — MIDAZOLAM HCL 2 MG/2ML IJ SOLN
INTRAMUSCULAR | Status: AC | PRN
  Administered 2019-07-06: 1 mg via INTRAVENOUS
  Administered 2019-07-06 (×2): 0.5 mg via INTRAVENOUS

## 2019-07-06 MED ORDER — GADOBUTROL 1 MMOL/ML IV SOLN
10.0000 mL | Freq: Once | INTRAVENOUS | Status: AC | PRN
  Administered 2019-07-06: 10 mL via INTRAVENOUS

## 2019-07-06 MED ORDER — IOPAMIDOL (ISOVUE-300) INJECTION 61%
100.0000 mL | Freq: Once | INTRAVENOUS | Status: AC | PRN
  Administered 2019-07-06: 100 mL via INTRAVENOUS

## 2019-07-06 MED ORDER — DEXTROSE-NACL 5-0.45 % IV SOLN
INTRAVENOUS | Status: DC
  Administered 2019-07-07: 04:00:00 via INTRAVENOUS

## 2019-07-06 MED ORDER — VANCOMYCIN HCL 10 G IV SOLR
2000.0000 mg | Freq: Three times a day (TID) | INTRAVENOUS | Status: DC
  Filled 2019-07-06 (×2): qty 2000

## 2019-07-06 MED ORDER — HYDROMORPHONE HCL 1 MG/ML IJ SOLN
1.0000 mg | Freq: Once | INTRAMUSCULAR | Status: AC
  Administered 2019-07-06: 1 mg via INTRAVENOUS
  Filled 2019-07-06: qty 1

## 2019-07-06 MED ORDER — HYDRALAZINE HCL 50 MG PO TABS
50.0000 mg | ORAL_TABLET | Freq: Two times a day (BID) | ORAL | Status: DC
  Administered 2019-07-06 – 2019-07-07 (×2): 50 mg via ORAL
  Filled 2019-07-06 (×2): qty 1

## 2019-07-06 MED ORDER — VANCOMYCIN HCL 10 G IV SOLR
2500.0000 mg | Freq: Once | INTRAVENOUS | Status: AC
  Administered 2019-07-06: 2500 mg via INTRAVENOUS
  Filled 2019-07-06: qty 2500

## 2019-07-06 MED ORDER — SODIUM CHLORIDE 0.9 % IV BOLUS (SEPSIS)
1000.0000 mL | Freq: Once | INTRAVENOUS | Status: AC
  Administered 2019-07-06 (×2): 1000 mL via INTRAVENOUS

## 2019-07-06 MED ORDER — CLINDAMYCIN PHOSPHATE 600 MG/50ML IV SOLN
600.0000 mg | Freq: Three times a day (TID) | INTRAVENOUS | Status: DC
  Administered 2019-07-06 – 2019-07-07 (×2): 600 mg via INTRAVENOUS
  Filled 2019-07-06 (×3): qty 50

## 2019-07-06 MED ORDER — ACETAMINOPHEN 650 MG RE SUPP: 650.0000 mg | Freq: Four times a day (QID) | RECTAL | Status: DC | PRN

## 2019-07-06 MED ORDER — HYDROMORPHONE HCL 1 MG/ML IJ SOLN
1.0000 mg | INTRAMUSCULAR | Status: DC | PRN
  Administered 2019-07-06: 1 mg via INTRAVENOUS
  Filled 2019-07-06 (×3): qty 1

## 2019-07-06 MED ORDER — INSULIN REGULAR(HUMAN) IN NACL 100-0.9 UT/100ML-% IV SOLN
INTRAVENOUS | Status: DC
  Administered 2019-07-06: 3.6 [IU]/h via INTRAVENOUS
  Filled 2019-07-06 (×2): qty 100

## 2019-07-06 MED ORDER — MIDAZOLAM HCL 2 MG/2ML IJ SOLN
INTRAMUSCULAR | Status: AC
  Filled 2019-07-06: qty 4

## 2019-07-06 NOTE — Progress Notes (Addendum)
Pharmacy Antibiotic Note  Bradley Cox is a 48 y.o. male admitted on 07/05/2019 with sepsis.  Pharmacy has been consulted for vancomycin and cefepime dosing.  Plan: Vancomycin 2500 mg IV x 1 then 2000 mg IV q8 hours Cefepime 2gm IV q8 hours F/u renal function, cultures and clinical course  Height: 6\' 6"  (198.1 cm) Weight: (!) 369 lb (167.4 kg) IBW/kg (Calculated) : 91.4  Temp (24hrs), Avg:100.6 F (38.1 C), Min:99 F (37.2 C), Max:102.8 F (39.3 C)  No results for input(s): WBC, CREATININE, LATICACIDVEN, VANCOTROUGH, VANCOPEAK, VANCORANDOM, GENTTROUGH, GENTPEAK, GENTRANDOM, TOBRATROUGH, TOBRAPEAK, TOBRARND, AMIKACINPEAK, AMIKACINTROU, AMIKACIN in the last 168 hours.  Estimated Creatinine Clearance: 169.2 mL/min (by C-G formula based on SCr of 0.91 mg/dL).    Allergies  Allergen Reactions  . Penicillins Anaphylaxis    Did it involve swelling of the face/tongue/throat, SOB, or low BP? Yes Did it involve sudden or severe rash/hives, skin peeling, or any reaction on the inside of your mouth or nose? No Did you need to seek medical attention at a hospital or doctor's office? Yes When did it last happen?4 months old If all above answers are "NO", may proceed with cephalosporin use.      Thank you for allowing pharmacy to be a part of this patient's care.  Excell Seltzer P[place]et 07/06/2019 4:54 AM

## 2019-07-06 NOTE — Plan of Care (Signed)
  Problem: Clinical Measurements: Goal: Respiratory complications will improve Outcome: Progressing   Problem: Pain Managment: Goal: General experience of comfort will improve Outcome: Progressing   

## 2019-07-06 NOTE — Progress Notes (Signed)
Pharmacy Antibiotic Note  Bradley Cox is a 48 y.o. male admitted on 07/05/2019 with sepsis. Pharmacy has been consulted for vancomycin and cefepime dosing.  Pt is febrile and WBC is WNL. SCr is above patients baseline and lactic acid is also elevated.   Plan: Continue cefepime as ordered Change vancomycin to 1500mg  IV Q12H F/u renal fxn, C&S, clinical status and peak/trough at SS  Height: 6\' 6"  (198.1 cm) Weight: (!) 369 lb (167.4 kg) IBW/kg (Calculated) : 91.4  Temp (24hrs), Avg:100.6 F (38.1 C), Min:99 F (37.2 C), Max:102.8 F (39.3 C)  Recent Labs  Lab 07/06/19 0359 07/06/19 0526  WBC 10.4  --   CREATININE  --  1.36*  LATICACIDVEN 3.0*  --     Estimated Creatinine Clearance: 113.2 mL/min (A) (by C-G formula based on SCr of 1.36 mg/dL (H)).    Vanc 9/4>> Cefepime 9/4>> Clinda x 1 9/4  Thank you for allowing pharmacy to be a part of this patient's care.  Jullian Clayson, Rande Lawman 07/06/2019 8:23 AM

## 2019-07-06 NOTE — ED Provider Notes (Addendum)
[facility] DEPART[facility] Provider Note   CSN: [id number] Arrival date & time: 07/05/19  1759     History   Chief Complaint Chief Complaint  Patient presents with   Back Pain    HPI Bradley Cox is a 48 y.o. male with history of kidney stones, asthma, obesity, hypertension, and diabetes mellitus who presents to the emergency department with a chief complaint of back pain.  The patient reports that he has been having right-sided low back pain for several weeks.  He reports that prior to the onset of this episode of back pain, that he has no history of chronic back pain.  Denies recent injuries, new exercises, or falls.  8/15 -Seen by PCP where he was started on antibiotics for UTI after he presented with hematuria and was given a shot of Toradol.  He states 'they put it right in my sciatic nerve."  The patient points to his posterior pelvis and not the gluteus when he references where the injection was given.  8/17-He reports that his pain significantly worsened overnight so he went to the ER where he was found to be hyperglycemic and was started on metformin and discharged with a course of ciprofloxacin for UTI.  He had a CT that demonstrated inflammation of the right ureter and was concern for recently having passed a stone.  The patient did endorse that he did pass a stone several days prior to ER visit.   Per chart review, culture was positive and organism was sensitive to ciprofloxacin.  The patient reports that he completed the course of ciprofloxacin.   He also reports that he was given a prednisone Dosepak for suspected lumbar radiculopathy, which he completed.  The patient's mother states that after he had been taking the medication that he developed hot flashes and seemed to develop redness to the skin and more frequent episodes of sweating.  She was concerned that he may have developed an allergy to the medication.  He reports that he has since  completed the prednisone Dosepak.  He reports that he has essentially been unable to walk since he was seen in the ER on August 17.  He is spent most of the last few weeks laying in bed.  He states that this is partially due to pain and partially due to feeling very weak.  He has a walker at home and does require use of a walker when he does attempt to ambulate.  However, he reports that his pain has become unbearable despite taking Percocet at home so he came to the ER for further evaluation.  He states that the pain is in his right low back and radiates down the right leg to just below the knee.  He reports that he had an episode where he was unable to make it to the toilet in time and urinated on himself while in the ER waiting room.  Otherwise, he has not had any episodes of urinary or fecal incontinence.  He also reports that he has not had a bowel movement in approximately 1 week.  He endorses some shortness of breath, but states that he has asthma.  No history of cancer or IV drug use.  He denies fever, chills, numbness, abdominal pain, nausea, vomiting, or diarrhea, chest pain, headache, dizziness, or lightheadedness.  He reports that all of his urinary complaints have resolved, including hematuria, dysuria, or urinary hesitancy and frequency.  He notes that over the last 1 to 2 days  that he has developed significant swelling throughout his entire right leg.  No history of similar.  No left leg pain or swelling.  The patient's mother does report a family history of blood clots, in her half-brother.  No personal history of DVT or PE.     The history is provided by the patient. No language interpreter was used.    Past Medical History:  Diagnosis Date   Arthritis    Asthma    Diabetes mellitus, type 2 ([place])    Hypertension    Morbid obesity ([place])    OSA (obstructive sleep apnea)     Patient Active Problem List   Diagnosis Date Noted   Sepsis ([place]) 07/06/2019   Abscess  07/06/2019   AKI (acute kidney injury) ([place]) 07/06/2019   Hyperkalemia 07/06/2019   Obesity, Class III, BMI 40-49.9 (morbid obesity) ([place]) 01/21/2009   GERD 01/21/2009   DYSPHAGIA 01/21/2009   Essential hypertension 01/07/2009   ASTHMA 01/07/2009    Past Surgical History:  Procedure Laterality Date   HERNIA REPAIR     As a baby        Home Medications    Prior to Admission medications   Medication Sig Start Date End Date Taking? Authorizing Provider  albuterol (VENTOLIN HFA) 108 (90 Base) MCG/ACT inhaler Inhale 1 puff into the lungs every 6 (six) hours as needed for wheezing or shortness of breath.    Yes [provider]  hydrALAZINE (APRESOLINE) 50 MG tablet Take 50 mg by mouth 2 (two) times daily. 06/21/15  Yes [provider]  ibuprofen (ADVIL) 200 MG tablet Take 600 mg by mouth every 6 (six) hours as needed for headache or moderate pain.   Yes [provider]  metFORMIN (GLUCOPHAGE) 500 MG tablet Take 1 tablet (500 mg total) by mouth 2 (two) times daily with a meal. 06/18/19 07/18/19 Yes Tacy Learn, PA-C  Multiple Vitamin (THERA) TABS Take 2 tablets by mouth daily.   Yes [provider]  blood glucose meter kit and supplies KIT Dispense based on patient and insurance preference. Use up to four times daily as directed. (FOR ICD-9 250.00, 250.01). 06/18/19   Tacy Learn, PA-C    Family History Family History  Problem Relation Age of Onset   Diabetes Paternal Grandmother     Social History Social History   Tobacco Use   Smoking status: Never Smoker   Smokeless tobacco: Current User    Types: Snuff  Substance Use Topics   Alcohol use: Yes    Comment: rarely   Drug use: Never     Allergies   Penicillins and Tramadol   Review of Systems Review of Systems  Constitutional: Negative for appetite change, chills and fever.  HENT: Negative for congestion and sore throat.   Eyes: Negative for visual disturbance.    Respiratory: Negative for cough, shortness of breath and wheezing.   Cardiovascular: Positive for leg swelling. Negative for chest pain.  Gastrointestinal: Negative for abdominal pain, diarrhea, nausea and vomiting.  Genitourinary: Negative for dysuria.  Musculoskeletal: Positive for arthralgias, back pain, gait problem and myalgias. Negative for joint swelling, neck pain and neck stiffness.  Skin: Negative for rash.  Allergic/Immunologic: Negative for immunocompromised state.  Neurological: Positive for weakness. Negative for dizziness, syncope, numbness and headaches.  Psychiatric/Behavioral: Negative for confusion.     Physical Exam Updated Vital Signs BP 117/66 (BP Location: Left Arm)    Pulse (!) 144    Temp 98.6 F (37 C) (Oral)  Resp (!) 25    Ht '6\' 6"'  (1.981 m)    Wt (!) 164 kg    SpO2 95%    BMI 41.78 kg/m   Physical Exam Vitals signs and nursing note reviewed.  Constitutional:      Appearance: He is well-developed. He is obese.     Comments: Appears ill and in distress  HENT:     Head: Normocephalic.  Eyes:     Conjunctiva/sclera: Conjunctivae normal.  Neck:     Musculoskeletal: Neck supple.  Cardiovascular:     Rate and Rhythm: Regular rhythm. Tachycardia present.     Heart sounds: No murmur.  Pulmonary:     Effort: Pulmonary effort is normal.  Abdominal:     General: There is no distension.     Palpations: Abdomen is soft. There is no mass.     Tenderness: There is no abdominal tenderness. There is no right CVA tenderness, left CVA tenderness, guarding or rebound.     Hernia: No hernia is present.     Comments: Abdomen is obese, but soft and nontender.  Musculoskeletal:     Right lower leg: Edema present.     Comments: Pitting edema present in the right leg up to the level of the thigh.  There is also asymmetric edema of the mid and lower back on the right side as compared to the left and he is very tender to palpation over this area.  Negative straight leg  raise on the left.  When the right leg is even raised 1 or 2 degrees off of the bed, the patient screams out in pain.  DP and PT pulses are 2+ and symmetric.  Sensation is intact and equal throughout.  Decreased strength against resistance on the right lower extremity secondary to pain.  Skin:    General: Skin is warm and dry.  Neurological:     Mental Status: He is alert.  Psychiatric:        Behavior: Behavior normal.      ED Treatments / Results  Labs (all labs ordered are listed, but only abnormal results are displayed) Labs Reviewed  BLOOD CULTURE ID PANEL (REFLEXED) - Abnormal; Notable for the following components:      Result Value   Staphylococcus species DETECTED (*)    Staphylococcus aureus (BCID) DETECTED (*)    All other components within normal limits  CBC WITH DIFFERENTIAL/PLATELET - Abnormal; Notable for the following components:   Neutro Abs 8.6 (*)    All other components within normal limits  URINALYSIS, ROUTINE W REFLEX MICROSCOPIC - Abnormal; Notable for the following components:   Glucose, UA >=500 (*)    Hgb urine dipstick MODERATE (*)    Ketones, ur 80 (*)    Bacteria, UA RARE (*)    All other components within normal limits  LACTIC ACID, PLASMA - Abnormal; Notable for the following components:   Lactic Acid, Venous 3.0 (*)    All other components within normal limits  LACTIC ACID, PLASMA - Abnormal; Notable for the following components:   Lactic Acid, Venous 2.2 (*)    All other components within normal limits  COMPREHENSIVE METABOLIC PANEL - Abnormal; Notable for the following components:   Sodium 127 (*)    Potassium 6.1 (*)    Chloride 88 (*)    CO2 21 (*)    Glucose, Bld 524 (*)    BUN 21 (*)    Creatinine, Ser 1.36 (*)    Albumin 1.9 (*)  Anion gap 18 (*)    All other components within normal limits  C-REACTIVE PROTEIN - Abnormal; Notable for the following components:   CRP 43.5 (*)    All other components within normal limits    SEDIMENTATION RATE - Abnormal; Notable for the following components:   Sed Rate 120 (*)    All other components within normal limits  BASIC METABOLIC PANEL - Abnormal; Notable for the following components:   Sodium 132 (*)    Chloride 95 (*)    Glucose, Bld 107 (*)    Calcium 8.4 (*)    All other components within normal limits  LACTIC ACID, PLASMA - Abnormal; Notable for the following components:   Lactic Acid, Venous 2.6 (*)    All other components within normal limits  GLUCOSE, CAPILLARY - Abnormal; Notable for the following components:   Glucose-Capillary 137 (*)    All other components within normal limits  GLUCOSE, CAPILLARY - Abnormal; Notable for the following components:   Glucose-Capillary 141 (*)    All other components within normal limits  GLUCOSE, CAPILLARY - Abnormal; Notable for the following components:   Glucose-Capillary 161 (*)    All other components within normal limits  GLUCOSE, CAPILLARY - Abnormal; Notable for the following components:   Glucose-Capillary 202 (*)    All other components within normal limits  GLUCOSE, CAPILLARY - Abnormal; Notable for the following components:   Glucose-Capillary 159 (*)    All other components within normal limits  GLUCOSE, CAPILLARY - Abnormal; Notable for the following components:   Glucose-Capillary 123 (*)    All other components within normal limits  CBG MONITORING, ED - Abnormal; Notable for the following components:   Glucose-Capillary 420 (*)    All other components within normal limits  CBG MONITORING, ED - Abnormal; Notable for the following components:   Glucose-Capillary 370 (*)    All other components within normal limits  CBG MONITORING, ED - Abnormal; Notable for the following components:   Glucose-Capillary 300 (*)    All other components within normal limits  I-STAT CHEM 8, ED - Abnormal; Notable for the following components:   Sodium 132 (*)    Chloride 96 (*)    Creatinine, Ser 0.60 (*)     Glucose, Bld 338 (*)    Hemoglobin 12.6 (*)    HCT 37.0 (*)    All other components within normal limits  CBG MONITORING, ED - Abnormal; Notable for the following components:   Glucose-Capillary 311 (*)    All other components within normal limits  CBG MONITORING, ED - Abnormal; Notable for the following components:   Glucose-Capillary 255 (*)    All other components within normal limits  CBG MONITORING, ED - Abnormal; Notable for the following components:   Glucose-Capillary 197 (*)    All other components within normal limits  CULTURE, BLOOD (ROUTINE X 2)  CULTURE, BLOOD (ROUTINE X 2)  URINE CULTURE  SARS CORONAVIRUS 2 (HOSPITAL ORDER, [facility])  AEROBIC/ANAEROBIC CULTURE (SURGICAL/DEEP WOUND)  ANAEROBIC CULTURE  ANAEROBIC CULTURE  CULTURE, FUNGUS WITHOUT SMEAR  HIV ANTIBODY (ROUTINE TESTING W REFLEX)  BASIC METABOLIC PANEL  CBC  I-STAT ARTERIAL BLOOD GAS, ED    EKG EKG Interpretation  Date/Time:  Friday July 06 2019 03:50:07 EDT Ventricular Rate:  118 PR Interval:  154 QRS Duration: 99 QT Interval:  298 QTC Calculation: 418 R Axis:   80 Text Interpretation:  Sinus tachycardia Borderline T wave abnormalities Confirmed by Ralene Bathe,  Benjamine Mola [phone]) on 07/06/2019 7:10:05 AM   Radiology Mr Lumbar Spine W Wo Contrast  Result Date: 07/06/2019 CLINICAL DATA:  Infection.  Rule out epidural abscess. EXAM: MRI LUMBAR SPINE WITHOUT AND WITH CONTRAST TECHNIQUE: Multiplanar and multiecho pulse sequences of the lumbar spine were obtained without and with intravenous contrast. CONTRAST:  10 mL Gadovist IV COMPARISON:  CT abdomen pelvis in CT lumbar spine 07/06/2019 FINDINGS: Segmentation:  Normal Alignment:  Mild retrolisthesis L1-2 and L4-5 and L5-S1. Vertebrae: Negative for vertebral fracture or mass. No vertebral edema or abnormal enhancement. No evidence of discitis. Conus medullaris and cauda equina: Conus extends to the L1-2 level. Conus and cauda  equina appear normal. Paraspinal and other soft tissues: Large enhancing fluid collection in the right paraspinous muscle at the L4 level measures 5.5 x 6 cm compatible with abscess. Adjacent facet joints show mild degeneration but no definite infection. Multilocular large fluid collection in the right iliopsoas muscle compatible with abscess. This measures at least 5 x 6 cm. Large multilocular fluid collection in the right gluteus muscle measures at least 5 cm. Abscess in the iliopsoas and gluteus muscle appear to communicate through the sciatic notch. Disc levels: L1-2: Disc and facet degeneration. Shallow left-sided disc protrusion with mild subarticular stenosis on the left. L2-3: Negative L3-4: Normal disc space. Small epidural abscess posteriorly on the left. Bilateral facet degeneration without evidence of facet joint infection. L4-5: Mild retrolisthesis. Disc degeneration and facet degeneration. Small extraforaminal disc protrusion on the left. Epidural abscess posteriorly on the left compressing the thecal sac and contributing to moderate spinal stenosis. No evidence of facet joint infection. L5-S1: Disc degeneration with left-sided disc protrusion. Posterior epidural abscess is ill-defined but is contributing to compression of thecal sac. There is enhancement and fluid along the right S1 nerve root which may be the route of spread to the epidural space. No evidence of facet infection. IMPRESSION: 1. Multiple areas of infection. Large areas of abscess formation in the right paraspinous muscle at the L4 level. Large abscess in the right iliopsoas muscle and also in the right gluteus muscle. 2. 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 This may have entered the spinal canal via the right S1 nerve root. No evidence of discitis or facet joint infection. Electronically Signed   By: Franchot Gallo M.D.   On: 07/06/2019 13:34   Mr Pelvis W Wo Contrast  Result Date: 07/06/2019 CLINICAL DATA:   Right-sided low back pain for several weeks. No recent injury. Recent antibiotic therapy for urinary tract infection. EXAM: MRI PELVIS WITHOUT AND WITH CONTRAST TECHNIQUE: Multiplanar multisequence MR imaging of the pelvis was performed both before and after administration of intravenous contrast. CONTRAST:  10 cc Gadavist COMPARISON:  Abdominopelvic CT 07/06/2019 and 06/18/2019. FINDINGS: Urinary Tract: The visualized distal ureters and bladder appear unremarkable. Bowel: No bowel wall thickening, distention or surrounding inflammation identified within the pelvis. Vascular/Lymphatic: No enlarged pelvic lymph nodes identified. No significant vascular findings. Reproductive: There are areas T2 hyperintensity and decreased enhancement centrally in the prostate gland bilaterally which may be secondary to benign prosthetic hypertrophy or prostatitis. There is no surrounding inflammatory change. The seminal vesicles appear normal. Other: No ascites. Musculoskeletal: As demonstrated on earlier CT scan, there are multiple complex fluid collections surrounding the right sacroiliac joint. These demonstrate internal air bubbles, heterogeneity and irregular peripheral enhancement. Dominant component laterally in the gluteus musculature measures up to 9.8 x 6.1 cm on image 18/13. There is a component in the right  issue femoral fossa measuring up to 5.2 cm on image 27/3. This demonstrates inferior extension around the right common hamstring tendon. There are intrapelvic components extending through the greater sciatic foramen, involving the right piriformis muscle and measuring up to 6.3 cm on image 13/12. There are also irregular components within the right iliopsoas muscle measuring up to 3.8 cm on image 9/13. There are right posterior paraspinal components within the erector spinae musculature, measuring up to 6.2 cm on image 1/13. There is mild T2 hyperintensity and enhancement within the right sacroiliac joint, with  associated marrow edema and enhancement in the adjacent right sacral ala. T2 hyperintensity and enhancement are also present within the left proximal common hamstring muscle. There are no focal fluid collections in the left pelvis. No significant hip joint effusion or abnormal synovial enhancement. IMPRESSION: 1. As seen on earlier CT, there are multiple complex fluid collections in the right pelvis with internal air and peripheral enhancement consistent with multifocal abscesses. 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 2. Inflammation extends into the erector spinae musculature on the right. Lumbar spine findings are dictated separately. 3. Mild T2 hyperintensity within the proximal left common hamstring muscle. 4. Central heterogeneity within the prostate gland is removed from these other findings and probably due to BPH. Prostatitis could have this appearance. Electronically Signed   By: Richardean Sale M.D.   On: 07/06/2019 13:32   Ct Abdomen Pelvis W Contrast  Addendum Date: 07/06/2019   ADDENDUM REPORT: 07/06/2019 08:20 ADDENDUM: Original report by Dr. Golden Circle. Addendum by Dr. Jeralyn Ruths for contrast extravasation documentation: Type of contrast:  Isovue 300 Site of extravasation: Right AC Estimated volume of extravasation: 40 ml Area of extravasation scanned with CT? no PATIENT'S SIGNS AND SYMPTOMS Skin blistering/ulceration: no Decrease capillary refill: no Change in skin color: no Decreased motor function or severe tightness: no Decreased pulses distal to site of extravasation: no Altered sensation: no Increasing pain or signs of increased swelling during observation: no TREATMENT An ice pack was applied with instructions given to the patient for limb elevation and warning signs to look for. The patient was returned to the emergency department. Electronically  Signed   By: Logan Bores M.D.   On: 07/06/2019 08:20   Result Date: 07/06/2019 CLINICAL DATA:  Right-sided pain for several weeks, initial encounter EXAM: CT ABDOMEN AND PELVIS WITH CONTRAST TECHNIQUE: Multidetector CT imaging of the abdomen and pelvis was performed using the standard protocol following bolus administration of intravenous contrast. CONTRAST:  133m ISOVUE-300 IOPAMIDOL (ISOVUE-300) INJECTION 61% COMPARISON:  06/18/2019 FINDINGS: Lower chest: Lung bases are well aerated bilaterally. There is a vague nodular density identified on the first image within the right middle lobe. This area was not covered on the prior exam. Hepatobiliary: Liver is diffusely decreased in attenuation consistent with fatty infiltration. The gallbladder is decompressed. Scattered calcifications are noted within consistent with stones. No obstructive changes are noted. Pancreas: Unremarkable. No pancreatic ductal dilatation or surrounding inflammatory changes. Spleen: Normal in size without focal abnormality. Adrenals/Urinary Tract: Adrenal glands are within normal limits. The kidneys are well visualized with renal calculi stable in appearance from the prior exam. The largest of these lies in the lower pole measuring approximately 9 mm. The left kidney demonstrates no renal calculi. Very mild prominence of the collecting system and ureter on the right are seen similar to that noted on the prior  exam without obstructing lesion. The bladder is well distended. Stomach/Bowel: No obstructive or inflammatory changes of the colon are seen. The appendix is not well visualized. No obstructive or inflammatory changes of the small bowel are seen. The stomach is unremarkable. Vascular/Lymphatic: No significant vascular findings are present. No enlarged abdominal or pelvic lymph nodes. Reproductive: Prostate is unremarkable. Other: No abdominal wall hernia or abnormality. No abdominopelvic ascites. Musculoskeletal: There are changes  within the right buttock, right iliac fossa and perispinal musculature on the right with fluid collections and air foci identified consistent with localized infection. The fluid collection within the right paraspinal musculature measures 5.6 x 5.5 cm consistent with a focal abscess. Similar extension into the iliac fossa on the right as well as the musculature of the right buttocks is seen. Soft tissue density in the presacral region and along the right pelvic wall are noted consistent with focal localized infection as well. IMPRESSION: Changes consistent with localized abscess involving the right iliac fossa, musculature of the right buttocks and right paraspinal musculature with extension into the lateral aspect of the pelvis on the right. Percutaneous drainage of the paraspinal collection may be helpful. These changes are all new from the prior exam. Stable renal calculi on the right. Stable cholelithiasis and fatty infiltration of the liver. Electronically Signed: By: Inez Catalina M.D. On: 07/06/2019 07:55   Ct Aspiration  Result Date: 07/06/2019 INDICATION: 48 year old with multiple sites of infection including a lumbar paraspinal muscular abscess. Plan for CT-guided aspiration of the paraspinal abscess collection. EXAM: CT-GUIDED ASPIRATION OF PARASPINAL ABSCESS MEDICATIONS: Moderate sedation ANESTHESIA/SEDATION: Fentanyl 100 mcg IV; Versed 2.0 mg IV Moderate Sedation Time:  10 minutes The patient was continuously monitored during the procedure by the interventional radiology nurse under my direct supervision. COMPLICATIONS: None immediate. PROCEDURE: Informed written consent was obtained from the patient after a thorough discussion of the procedural risks, benefits and alternatives. All questions were addressed. A timeout was performed prior to the initiation of the procedure. Patient was placed prone on the CT scanner. CT images through the lower abdomen were obtained. The right paraspinal muscular  abscess was identified at the L4-L5 level. Overlying skin was prepped with chlorhexidine. Sterile field was created. Skin was anesthetized with 1% lidocaine. Yueh catheter was directed into the abscess collection with CT guidance. Approximately 15 mL of brown purulent fluid was aspirated. No additional fluid could be aspirated. At this point in the procedure the patient was in severe pain from lying prone and the procedure was stopped. Bandage placed over the puncture site. FINDINGS: Low-density abscess in the right paraspinal musculature at L4-L5. 15 mL of brown purulent fluid was removed from this collection. IMPRESSION: CT-guided aspiration of the right paraspinal abscess at L4-L5. 15 mL of purulent fluid was removed and sent for culture. Electronically Signed   By: Markus Daft M.D.   On: 07/06/2019 19:22   Ct L-spine No Charge  Result Date: 07/06/2019 CLINICAL DATA:  Right-sided back pain and right lower extremity pain for 3 weeks. EXAM: CT LUMBAR SPINE WITHOUT CONTRAST TECHNIQUE: Multidetector CT imaging of the lumbar spine was performed without intravenous contrast administration. Multiplanar CT image reconstructions were also generated. COMPARISON:  None. FINDINGS: Segmentation: 5 lumbar type vertebrae. Alignment: Normal. Vertebrae: No acute fracture or focal pathologic process. Paraspinal and other soft tissues: There is gas in the paraspinal soft tissues with a 5.6 cm fluid collection in the right multifidus muscle at the L4-5 level. There is gas in the right iliacus and psoas  muscles and right gluteus minimus and medius muscles with expansion of those muscles with low-density consistent with abscess. There is also fluid and abnormal soft tissue in the presacral space extending through the right sciatic notch. There is a tiny bubble of air in the right hip joint. Cholelithiasis. 5 mm stone in the mid right kidney. Small right inguinal hernia containing only fat. Disc levels: T7-8 through L3-4: No  appreciable disc protrusions. The discs at T9-10 and T11-12 are degenerated with vacuum phenomena. L4-5: Small broad-based disc bulge. Slight hypertrophy of the left ligamentum flavum creates narrowing of the spinal canal. The CT thecal sac appears compressed in the transverse dimension. L5-S1: Disc space narrowing asymmetric disc osteophyte complex to the left of midline. There appears to be abnormal soft tissue in the spinal canal at S1. Given the adjacent soft tissue infection, the possibility of infection in the spinal canal should be considered. MRI of the lower abdomen and pelvis and lumbar spine recommended for further evaluation of the findings to determine whether there is epidural extension of infection. IMPRESSION: 1. Extensive soft tissue infection in bulging the right paraspinal muscles, right gluteal muscles, presacral space, and the soft tissues of the right sciatic notch. There is a definable abscess in the right multifidus muscle. 2. Subtle increased soft tissue in the spinal canal at S1. Asymmetric compression of the thecal sac at L4-5. The possibility of infection within the spinal canal should be considered. 3. MRI of the lumbar spine with and without contrast and MRI of the lower abdomen and pelvis with and without contrast recommended for further evaluation. Critical Value/emergent results were called by telephone at the time of interpretation on 07/06/2019 at 8:17 am to the attending ER physician , who verbally acknowledged these results. Electronically Signed   By: Lorriane Shire M.D.   On: 07/06/2019 08:16   Dg Chest Portable 1 View  Result Date: 07/06/2019 CLINICAL DATA:  Shortness of breath. EXAM: PORTABLE CHEST 1 VIEW COMPARISON:  None. FINDINGS: Mild cardiomegaly. Normal mediastinal contours. No focal airspace disease, pleural effusion, or pneumothorax. No pulmonary edema. No acute osseous abnormalities. IMPRESSION: Mild cardiomegaly. No acute chest findings. Electronically Signed   By:  Keith Rake M.D.   On: 07/06/2019 04:10    Procedures .Critical Care Performed by: Joanne Gavel, PA-C Authorized by: Joanne Gavel, PA-C   Critical care provider statement:    Critical care time (minutes):  55   Critical care time was exclusive of:  Separately billable procedures and treating other patients and teaching time   Critical care was necessary to treat or prevent imminent or life-threatening deterioration of the following conditions:  Sepsis   Critical care was time spent personally by me on the following activities:  Re-evaluation of patient's condition, ordering and review of radiographic studies, ordering and review of laboratory studies, ordering and performing treatments and interventions, review of old charts, obtaining history from patient or surrogate, examination of patient, evaluation of patient's response to treatment and development of treatment plan with patient or surrogate   I assumed direction of critical care for this patient from another provider in my specialty: no     (including critical care time)  Medications Ordered in ED Medications  HYDROmorphone (DILAUDID) injection 1 mg (1 mg Intravenous Given 07/06/19 2136)  ceFEPIme (MAXIPIME) 2 g in sodium chloride 0.9 % 100 mL IVPB (2 g Intravenous New Bag/Given 07/06/19 2229)  insulin regular, human (MYXREDLIN) 100 units/ 100 mL infusion (2.9 Units/hr Intravenous Rate/Dose Change  07/07/19 0317)  dextrose 5 %-0.45 % sodium chloride infusion (has no administration in time range)  vancomycin (VANCOCIN) 1,500 mg in sodium chloride 0.9 % 500 mL IVPB (has no administration in time range)  0.9 %  sodium chloride infusion ( Intravenous New Bag/Given 07/06/19 2319)  hydrALAZINE (APRESOLINE) tablet 50 mg (50 mg Oral Given 07/06/19 2137)  enoxaparin (LOVENOX) injection 40 mg (has no administration in time range)  sodium chloride flush (NS) 0.9 % injection 3 mL (3 mLs Intravenous Not Given 07/06/19 2152)  acetaminophen  (TYLENOL) tablet 650 mg (has no administration in time range)    Or  acetaminophen (TYLENOL) suppository 650 mg (has no administration in time range)  ondansetron (ZOFRAN) tablet 4 mg (has no administration in time range)    Or  ondansetron (ZOFRAN) injection 4 mg (has no administration in time range)  albuterol (PROVENTIL) (2.5 MG/3ML) 0.083% nebulizer solution 2.5 mg (has no administration in time range)  midazolam (VERSED) 2 MG/2ML injection (has no administration in time range)  fentaNYL (SUBLIMAZE) 100 MCG/2ML injection (has no administration in time range)  clindamycin (CLEOCIN) IVPB 600 mg (600 mg Intravenous New Bag/Given 07/06/19 2149)  HYDROmorphone (DILAUDID) injection 1 mg (1 mg Intravenous Given 07/06/19 0405)  ondansetron (ZOFRAN) injection 4 mg (4 mg Intravenous Given 07/06/19 0405)  ceFEPIme (MAXIPIME) 2 g in sodium chloride 0.9 % 100 mL IVPB (0 g Intravenous Stopped 07/06/19 1335)  vancomycin (VANCOCIN) 2,500 mg in sodium chloride 0.9 % 500 mL IVPB (0 mg Intravenous Stopped 07/06/19 1335)  acetaminophen (TYLENOL) tablet 1,000 mg (1,000 mg Oral Given 07/06/19 0710)  sodium chloride 0.9 % bolus 1,000 mL (0 mLs Intravenous Stopped 07/06/19 1336)    And  sodium chloride 0.9 % bolus 1,000 mL (0 mLs Intravenous Stopped 07/06/19 1336)    And  sodium chloride 0.9 % bolus 800 mL (0 mLs Intravenous Stopped 07/06/19 1644)  iopamidol (ISOVUE-300) 61 % injection 100 mL (100 mLs Intravenous Contrast Given 07/06/19 0729)  LORazepam (ATIVAN) injection 1 mg (1 mg Intravenous Given 07/06/19 1047)  HYDROmorphone (DILAUDID) injection 2 mg (2 mg Intravenous Given 07/06/19 1046)  gadobutrol (GADAVIST) 1 MMOL/ML injection 10 mL (10 mLs Intravenous Contrast Given 07/06/19 1230)  midazolam (VERSED) injection (0.5 mg Intravenous Given 07/06/19 1838)  fentaNYL (SUBLIMAZE) injection (25 mcg Intravenous Given 07/06/19 1838)  0.9 %  sodium chloride infusion (10 mL/hr Intravenous New Bag/Given 07/06/19 1831)     Initial Impression  / Assessment and Plan / ED Course  I have reviewed the triage vital signs and the nursing notes.  Pertinent labs & imaging results that were available during my care of the patient were reviewed by me and considered in my medical decision making (see chart for details).        48 year old male with history of kidney stones, asthma, obesity, hypertension, and diabetes mellitus presenting with worsening right-sided low back pain for the last 3 weeks.  He has essentially been bedbound as he has been unable to walk secondary to generalized weakness and severe pain.  He does note that during that time he has been treated for multiple UTIs and did also pass a kidney stone.  Of note, the patient was seen at the onset of his symptoms and was given IM Toradol, which he states was given in his low back as opposed to in his gluteus.  He is tachycardic in the 140s on arrival to the ER. Oral temp is 99.  He is normotensive without tachypnea or tachycardia.  I  examined the patient almost immediately after he was roomed in the ER. On exam, the patient appears ill and in distress due to the pain.  Heart rate is now in the 120s.  He has severe pitting edema to the entire right extremity as well as edema to the musculature of the right thoracic and lumbar regions of the back.  He is exquisitely tender to palpation in this area.  He is unable to lift the right leg off the bed even 1 to 2 degrees without screaming and writhing in pain.   Dilaudid ordered for pain control.  Rectal temp was found to be 102.8.  Tylenol ordered.  Sepsis order set was placed and given the patient's heart rate he was started on a 30 cc/kg bolus, which also treat his initial lactate of 3.0.  Blood cultures x2 have been ordered prior to initiation of vancomycin and cefepime for sepsis of unknown etiology.   Labs are notable for glucose of 524 with an anion gap of 18 and bicarb of 21.  DKA order set ordered.  He is also hyperkalemic at 6.1, which  will be treated with IV fluids and insulin from DKA order set.  He also has pseudohyponatremia and a new AKI with creatinine of 1.36.  Hyperglycemia is likely exacerbated by underlying diabetes mellitus that has been grossly untreated other than when he was started on metformin during his last ER visit, which also could be contributing to elevated lactate.  However, the patient also did recently complete a prednisone Dosepak.  Although the patient has a history of recurrent UTIs and nephrolithiasis, his abdominal exam is unremarkable.  UA with hemoglobinuria, but does not appear infectious will order CT stone study from ER visit on 817 was reviewed with findings concerning for nonobstructive right intrarenal calculi and mild stranding of the right ureter that was suspicious for recently passed ureteral calculus and cholelithiasis and a contracted gallbladder.   CT abdomen pelvis and CT lumbar spine for further evaluation.  He is tachycardic, but this is likely secondary to fever.  However, given that he has been essentially bedbound for the last 3 weeks, could also consider DVT.  He denies chest pain at this time, but is endorsing some shortness of breath.  Patient care transferred to PA [facility] at the end of my shift pending imaging and further evaluation work-up. Patient presentation, ED course, and plan of care discussed with review of all pertinent labs and imaging. Please see his/her note for further details regarding further ED course and disposition.  Final Clinical Impressions(s) / ED Diagnoses   Final diagnoses:  Back pain  Abscess of right hip  Spinal epidural abscess  Sepsis, due to unspecified organism, unspecified whether acute organ dysfunction present [facility])  Diabetic ketoacidosis without coma associated with other specified diabetes mellitus [facility])    ED Discharge Orders    None       Gaylia Kassel A, PA-C 07/06/19 0818    Jonae Renshaw A, PA-C 07/07/19 0331    Mesner,  Corene Cornea, MD 07/07/19 [phone]

## 2019-07-06 NOTE — H&P (Addendum)
°History and Physical  ° ° °Bradley Cox MRN:[id number] DOB: 06/07/1971 DOA: 07/05/2019 ° °Referring MD/NP/PA: BowieTran, PA-C °PCP: McCoy, Rachel, NP  °Patient coming from: Home ° °Chief Complaint: Right flank pain ° °I have personally briefly reviewed patient's old medical records in [facility] ° ° °HPI: Bradley Cox is a 48 y.o. male with medical history significant of hypertension, diabetes mellitus type 2, asthma, and arthritis; who presents with complaints of right flank pain.  History is obtained from the patient and his mother who is present at bedside.  Patient reports symptoms started approximately 3 weeks ago after initially being seen in urgent care for kidney stones.  He was given a shot of Toradol in his right buttock on a steroid Dosepak.  However, instead of symptoms getting better and they got worse.  He reports swelling and pain in right flank radiating down his leg.  He has been unable to walk due to symptoms.  Reports that his diabetes is currently managed with metformin and he has not been on insulin.  He does not know what his last hemoglobin A1c was. ° °ED Course: Upon admission into the emergency department patient was seen to be febrile up to 102.8 °F, tachycardic, and tachypneic.  Blood pressures were noted to be maintained.  Labs revealed WBC 10.4, sodium 127, potassium 6.1, BUN 21, creatinine 1.36, 524, anion gap 18, and lactic acid 3.  COVID-19 screen was negative.  Urinalysis was positive for ketones and glucose, but no significant signs of infection.  CT scan revealed a localized abscess involving the right iliac fossa and muscular at the right buttocks and paraspinal muscle.  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  Sepsis protocol was initiated with full fluid bolus, blood cultures were obtained, and empiric antibiotics started.  Patient  was also started on insulin drip for signs of DKA.  Neurosurgery have been consulted but recommended IR be consulted instead for possible drainage.  TR[facility] called to admit. ° °Review of Systems  °Constitutional: Positive for fever and malaise/fatigue.  °HENT: Negative for congestion and sinus pain.   °Eyes: Negative for photophobia and pain.  °Respiratory: Negative for cough and shortness of breath.   °Cardiovascular: Negative for chest pain.  °Gastrointestinal: Negative for abdominal pain, diarrhea and vomiting.  °Genitourinary: Positive for flank pain and frequency. Negative for dysuria.  °Musculoskeletal: Positive for back pain and myalgias.  °Neurological: Positive for weakness.  °Psychiatric/Behavioral: Negative for memory loss and substance abuse.  ° ° °Past Medical History:  °Diagnosis Date  °• Arthritis   °• Diabetes mellitus, type 2 (HCC)   °• Hypertension   °• Morbid obesity (HCC)   °• OSA (obstructive sleep apnea)   ° ° °Past Surgical History:  °Procedure Laterality Date  °• HERNIA REPAIR    ° As a baby  ° ° ° reports that he has never smoked. His smokeless tobacco use includes snuff. He reports current alcohol use. He reports that he does not use drugs. ° °Allergies  °Allergen Reactions  °• Penicillins Anaphylaxis  °  Did it involve swelling of the face/tongue/throat, SOB, or low BP? Yes °Did it involve sudden or severe rash/hives, skin peeling, or any reaction on the inside of your mouth or nose? No °Did you need to seek medical attention at a hospital or doctor's office? Yes °When did it last happen? 4   happen?4 months old If all above answers are NO, may proceed with cephalosporin use.    Tramadol Anaphylaxis    Family History  Problem Relation Age of Onset   Diabetes Paternal Grandmother   Also reports family history of hypertension.  Prior to Admission medications   Medication Sig Start Date End Date Taking? Authorizing Provider  albuterol (VENTOLIN HFA) 108 (90 Base) MCG/ACT inhaler Inhale 1  puff into the lungs every 6 (six) hours as needed for wheezing or shortness of breath.    Yes [provider]  hydrALAZINE (APRESOLINE) 50 MG tablet Take 50 mg by mouth 2 (two) times daily. 06/21/15  Yes [provider]  ibuprofen (ADVIL) 200 MG tablet Take 600 mg by mouth every 6 (six) hours as needed for headache or moderate pain.   Yes [provider]  metFORMIN (GLUCOPHAGE) 500 MG tablet Take 1 tablet (500 mg total) by mouth 2 (two) times daily with a meal. 06/18/19 07/18/19 Yes Tacy Learn, PA-C  Multiple Vitamin (THERA) TABS Take 2 tablets by mouth daily.   Yes [provider]  blood glucose meter kit and supplies KIT Dispense based on patient and insurance preference. Use up to four times daily as directed. (FOR ICD-9 250.00, 250.01). 06/18/19   Tacy Learn, PA-C    Physical Exam:  Constitutional: Obese male who appears to be acute distress Vitals:   07/06/19 1015 07/06/19 1030 07/06/19 1040 07/06/19 1054  BP:  116/74 116/74   Pulse:   (!) 111   Resp: 16 (!) 23 20   Temp:    98.6 F (37 C)  TempSrc:    Oral  SpO2:   98%   Weight:      Height:       Eyes: PERRL, lids and conjunctivae normal ENMT: Mucous membranes are dry posterior pharynx clear of any exudate or lesions.  Neck: normal, supple, no masses, no thyromegaly Respiratory: Decreased overall aeration.  No wheezes or rhonchi appreciated, but patient on 2 L nasal cannula oxygen to maintain O2 saturations at this time following his procedure. Cardiovascular: Tachycardic, no murmurs / rubs / gallops.  Significant edema of the right lower extremity noted   2+ pedal pulses. No carotid bruits.  Abdomen: no tenderness, no masses palpated. No hepatosplenomegaly. Bowel sounds positive.  Musculoskeletal: no clubbing / cyanosis.  Tenderness palpation of the right lower back and pain with straight leg raise on the right side. Neurologic: CN 2-12 grossly intact. Sensation intact, DTR normal.  Strength 5/5 in all 4.  Psychiatric: Normal judgment and insight. Alert and oriented x 3. Normal mood.     Labs on Admission: I have personally reviewed following labs and imaging studies  CBC: Recent Labs  Lab 07/06/19 0359  WBC 10.4  NEUTROABS 8.6*  HGB 14.0  HCT 42.6  MCV 94.2  PLT 276   Basic Metabolic Panel: Recent Labs  Lab 07/06/19 0526  NA 127*  K 6.1*  CL 88*  CO2 21*  GLUCOSE 524*  BUN 21*  CREATININE 1.36*  CALCIUM 9.2   GFR: Estimated Creatinine Clearance: 113.2 mL/min (A) (by C-G formula based on SCr of 1.36 mg/dL (H)). Liver Function Tests: Recent Labs  Lab 07/06/19 0526  AST 25  ALT 19  ALKPHOS 84  BILITOT 1.0  PROT 7.7  ALBUMIN 1.9*   No results for input(s): LIPASE, AMYLASE in the last 168 hours. No results for input(s): AMMONIA in the last 168 hours. Coagulation Profile: No results for input(s): INR,  PROTIME in the last 168 hours. Cardiac Enzymes: No results for input(s): CKTOTAL, CKMB, CKMBINDEX, TROPONINI in the last 168 hours. BNP (last 3 results) No results for input(s): PROBNP in the last 8760 hours. HbA1C: No results for input(s): HGBA1C in the last 72 hours. CBG: Recent Labs  Lab 07/06/19 1327 07/06/19 1527  GLUCAP 420* 370*   Lipid Profile: No results for input(s): CHOL, HDL, LDLCALC, TRIG, CHOLHDL, LDLDIRECT in the last 72 hours. Thyroid Function Tests: No results for input(s): TSH, T4TOTAL, FREET4, T3FREE, THYROIDAB in the last 72 hours. Anemia Panel: No results for input(s): VITAMINB12, FOLATE, FERRITIN, TIBC, IRON, RETICCTPCT in the last 72 hours. Urine analysis:    Component Value Date/Time   COLORURINE YELLOW 07/06/2019 [place] 07/06/2019 0437   LABSPEC 1.024 07/06/2019 0437   PHURINE 5.0 07/06/2019 0437   GLUCOSEU >=500 (A) 07/06/2019 0437   HGBUR MODERATE (A) 07/06/2019 0437   BILIRUBINUR NEGATIVE 07/06/2019 0437   KETONESUR 80 (A) 07/06/2019 0437   PROTEINUR NEGATIVE 07/06/2019 0437    NITRITE NEGATIVE 07/06/2019 0437   LEUKOCYTESUR NEGATIVE 07/06/2019 0437   Sepsis Labs: Recent Results (from the past 240 hour(s))  Blood culture (routine x 2)     Status: None (Preliminary result)   Collection Time: 07/06/19  4:30 AM   Specimen: BLOOD  Result Value Ref Range Status   Specimen Description BLOOD SITE NOT SPECIFIED  Final   Special Requests   Final    BOTTLES DRAWN AEROBIC AND ANAEROBIC Blood Culture results may not be optimal due to an inadequate volume of blood received in culture bottles   Culture   Final    NO GROWTH < 12 HOURS Performed at [facility], [place] [address] [place], [place] [address]    Report Status PENDING  Incomplete  Blood culture (routine x 2)     Status: None (Preliminary result)   Collection Time: 07/06/19  4:30 AM   Specimen: BLOOD  Result Value Ref Range Status   Specimen Description BLOOD SITE NOT SPECIFIED  Final   Special Requests   Final    BOTTLES DRAWN AEROBIC AND ANAEROBIC Blood Culture adequate volume   Culture   Final    NO GROWTH < 12 HOURS Performed at [facility], [place] [address], [place], [place] [address]    Report Status PENDING  Incomplete  SARS Coronavirus 2 [facility] order, Performed in [facility] hospital lab) Nasopharyngeal Nasopharyngeal Swab     Status: None   Collection Time: 07/06/19  8:04 AM   Specimen: Nasopharyngeal Swab  Result Value Ref Range Status   SARS Coronavirus 2 NEGATIVE NEGATIVE Final    Comment: (NOTE) If result is NEGATIVE SARS-CoV-2 target nucleic acids are NOT DETECTED. The SARS-CoV-2 RNA is generally detectable in upper and lower  respiratory specimens during the acute phase of infection. The lowest  concentration of SARS-CoV-2 viral copies this assay can detect is 250  copies / mL. A negative result does not preclude SARS-CoV-2 infection  and should not be used as the sole basis for treatment or other  patient management decisions.  A negative result may occur with  improper  specimen collection / handling, submission of specimen other  than nasopharyngeal swab, presence of viral mutation(s) within the  areas targeted by this assay, and inadequate number of viral copies  (<250 copies / mL). A negative result must be combined with clinical  observations, patient history, and epidemiological information. If result is POSITIVE SARS-CoV-2 target  nucleic acids are DETECTED. The SARS-CoV-2 RNA is generally detectable in upper and lower  respiratory specimens dur ing the acute phase of infection.  Positive  results are indicative of active infection with SARS-CoV-2.  Clinical  correlation with patient history and other diagnostic information is  necessary to determine patient infection status.  Positive results do  not rule out bacterial infection or co-infection with other viruses. If result is PRESUMPTIVE POSTIVE SARS-CoV-2 nucleic acids MAY BE PRESENT.   A presumptive positive result was obtained on the submitted specimen  and confirmed on repeat testing.  While 2019 novel coronavirus  (SARS-CoV-2) nucleic acids may be present in the submitted sample  additional confirmatory testing may be necessary for epidemiological  and / or clinical management purposes  to differentiate between  SARS-CoV-2 and other Sarbecovirus currently known to infect humans.  If clinically indicated additional testing with an alternate test  methodology [phone]) is advised. The SARS-CoV-2 RNA is generally  detectable in upper and lower respiratory sp ecimens during the acute  phase of infection. The expected result is Negative. Fact Sheet for Patients:  StrictlyIdeas.no Fact Sheet for Healthcare Providers: BankingDealers.co.za This test is not yet approved or cleared by the [place] FDA and has been authorized for detection and/or diagnosis of SARS-CoV-2 by FDA under an Emergency Use Authorization (EUA).  This EUA will remain in  effect (meaning this test can be used) for the duration of the COVID-19 declaration under Section 564(b)(1) of the Act, 21 U.S.C. section 360bbb-3(b)(1), unless the authorization is terminated or revoked sooner. Performed at [facility], [place] [address], [place], [place] [address]      Radiological Exams on Admission: Mr Lumbar Spine W Wo Contrast  Result Date: 07/06/2019 CLINICAL DATA:  Infection.  Rule out epidural abscess. EXAM: MRI LUMBAR SPINE WITHOUT AND WITH CONTRAST TECHNIQUE: Multiplanar and multiecho pulse sequences of the lumbar spine were obtained without and with intravenous contrast. CONTRAST:  10 mL Gadovist IV COMPARISON:  CT abdomen pelvis in CT lumbar spine 07/06/2019 FINDINGS: Segmentation:  Normal Alignment:  Mild retrolisthesis L1-2 and L4-5 and L5-S1. Vertebrae: Negative for vertebral fracture or mass. No vertebral edema or abnormal enhancement. No evidence of discitis. Conus medullaris and cauda equina: Conus extends to the L1-2 level. Conus and cauda equina appear normal. Paraspinal and other soft tissues: Large enhancing fluid collection in the right paraspinous muscle at the L4 level measures 5.5 x 6 cm compatible with abscess. Adjacent facet joints show mild degeneration but no definite infection. Multilocular large fluid collection in the right iliopsoas muscle compatible with abscess. This measures at least 5 x 6 cm. Large multilocular fluid collection in the right gluteus muscle measures at least 5 cm. Abscess in the iliopsoas and gluteus muscle appear to communicate through the sciatic notch. Disc levels: L1-2: Disc and facet degeneration. Shallow left-sided disc protrusion with mild subarticular stenosis on the left. L2-3: Negative L3-4: Normal disc space. Small epidural abscess posteriorly on the left. Bilateral facet degeneration without evidence of facet joint infection. L4-5: Mild retrolisthesis. Disc degeneration and facet degeneration. Small extraforaminal disc  protrusion on the left. Epidural abscess posteriorly on the left compressing the thecal sac and contributing to moderate spinal stenosis. No evidence of facet joint infection. L5-S1: Disc degeneration with left-sided disc protrusion. Posterior epidural abscess is ill-defined but is contributing to compression of thecal sac. There is enhancement and fluid along the right S1 nerve root which may be the route of spread to the epidural space.  evidence of facet infection. IMPRESSION: 1. Multiple areas of infection. Large areas of abscess formation in the right paraspinous muscle at the L4 level. Large abscess in the right iliopsoas muscle and also in the right gluteus muscle. 2. 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 This may have entered the spinal canal via the right S1 nerve root. No evidence of discitis or facet joint infection. Electronically Signed   By: Treyvion  Clark M.D.   On: 07/06/2019 13:34  ° °Mr Pelvis W Wo Contrast ° °Result Date: 07/06/2019 °CLINICAL DATA:  Right-sided low back pain for several weeks. No recent injury. Recent antibiotic therapy for urinary tract infection. EXAM: MRI PELVIS WITHOUT AND WITH CONTRAST TECHNIQUE: Multiplanar multisequence MR imaging of the pelvis was performed both before and after administration of intravenous contrast. CONTRAST:  10 cc Gadavist COMPARISON:  Abdominopelvic CT 07/06/2019 and 06/18/2019. FINDINGS: Urinary Tract: The visualized distal ureters and bladder appear unremarkable. Bowel: No bowel wall thickening, distention or surrounding inflammation identified within the pelvis. Vascular/Lymphatic: No enlarged pelvic lymph nodes identified. No significant vascular findings. Reproductive: There are areas T2 hyperintensity and decreased enhancement centrally in the prostate gland bilaterally which may be secondary to benign prosthetic hypertrophy or prostatitis. There is no surrounding inflammatory change. The seminal vesicles appear normal. Other:  No ascites. Musculoskeletal: As demonstrated on earlier CT scan, there are multiple complex fluid collections surrounding the right sacroiliac joint. These demonstrate internal air bubbles, heterogeneity and irregular peripheral enhancement. Dominant component laterally in the gluteus musculature measures up to 9.8 x 6.1 cm on image 18/13. There is a component in the right issue femoral fossa measuring up to 5.2 cm on image 27/3. This demonstrates inferior extension around the right common hamstring tendon. There are intrapelvic components extending through the greater sciatic foramen, involving the right piriformis muscle and measuring up to 6.3 cm on image 13/12. There are also irregular components within the right iliopsoas muscle measuring up to 3.8 cm on image 9/13. There are right posterior paraspinal components within the erector spinae musculature, measuring up to 6.2 cm on image 1/13. There is mild T2 hyperintensity and enhancement within the right sacroiliac joint, with associated marrow edema and enhancement in the adjacent right sacral ala. T2 hyperintensity and enhancement are also present within the left proximal common hamstring muscle. There are no focal fluid collections in the left pelvis. No significant hip joint effusion or abnormal synovial enhancement. IMPRESSION: 1. As seen on earlier CT, there are multiple complex fluid collections in the right pelvis with internal air and peripheral enhancement consistent with multifocal abscesses. 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 2. Inflammation extends into the erector spinae musculature on the right. Lumbar spine findings are dictated separately. 3. Mild T2 hyperintensity within the proximal left common hamstring muscle. 4. Central heterogeneity within the prostate gland is removed from these other  findings and probably due to BPH. Prostatitis could have this appearance. Electronically Signed   By: William  Veazey M.D.   On: 07/06/2019 13:32  ° °Ct Abdomen Pelvis W Contrast ° °Addendum Date: 07/06/2019   °ADDENDUM REPORT: 07/06/2019 08:20 ADDENDUM: Original report by Dr. Lukens. Addendum by Dr. Grady for contrast extravasation documentation: Type of contrast:  Isovue 300 Site of extravasation: Right AC Estimated volume of extravasation: 40 ml Area of extravasation scanned with CT? no PATIENT'S SIGNS AND SYMPTOMS Skin blistering/ulceration:   blistering/ulceration: no Decrease capillary refill: no Change in skin color: no Decreased motor function or severe tightness: no Decreased pulses distal to site of extravasation: no Altered sensation: no Increasing pain or signs of increased swelling during observation: no TREATMENT An ice pack was applied with instructions given to the patient for limb elevation and warning signs to look for. The patient was returned to the emergency department. Electronically Signed   By: Logan Bores M.D.   On: 07/06/2019 08:20   Result Date: 07/06/2019 CLINICAL DATA:  Right-sided pain for several weeks, initial encounter EXAM: CT ABDOMEN AND PELVIS WITH CONTRAST TECHNIQUE: Multidetector CT imaging of the abdomen and pelvis was performed using the standard protocol following bolus administration of intravenous contrast. CONTRAST:  175m ISOVUE-300 IOPAMIDOL (ISOVUE-300) INJECTION 61% COMPARISON:  06/18/2019 FINDINGS: Lower chest: Lung bases are well aerated bilaterally. There is a vague nodular density identified on the first image within the right middle lobe. This area was not covered on the prior exam. Hepatobiliary: Liver is diffusely decreased in attenuation consistent with fatty infiltration. The gallbladder is decompressed. Scattered calcifications are noted within consistent with stones. No obstructive changes are noted. Pancreas: Unremarkable. No pancreatic ductal dilatation or surrounding inflammatory  changes. Spleen: Normal in size without focal abnormality. Adrenals/Urinary Tract: Adrenal glands are within normal limits. The kidneys are well visualized with renal calculi stable in appearance from the prior exam. The largest of these lies in the lower pole measuring approximately 9 mm. The left kidney demonstrates no renal calculi. Very mild prominence of the collecting system and ureter on the right are seen similar to that noted on the prior exam without obstructing lesion. The bladder is well distended. Stomach/Bowel: No obstructive or inflammatory changes of the colon are seen. The appendix is not well visualized. No obstructive or inflammatory changes of the small bowel are seen. The stomach is unremarkable. Vascular/Lymphatic: No significant vascular findings are present. No enlarged abdominal or pelvic lymph nodes. Reproductive: Prostate is unremarkable. Other: No abdominal wall hernia or abnormality. No abdominopelvic ascites. Musculoskeletal: There are changes within the right buttock, right iliac fossa and perispinal musculature on the right with fluid collections and air foci identified consistent with localized infection. The fluid collection within the right paraspinal musculature measures 5.6 x 5.5 cm consistent with a focal abscess. Similar extension into the iliac fossa on the right as well as the musculature of the right buttocks is seen. Soft tissue density in the presacral region and along the right pelvic wall are noted consistent with focal localized infection as well. IMPRESSION: Changes consistent with localized abscess involving the right iliac fossa, musculature of the right buttocks and right paraspinal musculature with extension into the lateral aspect of the pelvis on the right. Percutaneous drainage of the paraspinal collection may be helpful. These changes are all new from the prior exam. Stable renal calculi on the right. Stable cholelithiasis and fatty infiltration of the liver.  Electronically Signed: By: MInez CatalinaM.D. On: 07/06/2019 07:55   Ct L-spine No Charge  Result Date: 07/06/2019 CLINICAL DATA:  Right-sided back pain and right lower extremity pain for 3 weeks. EXAM: CT LUMBAR SPINE WITHOUT CONTRAST TECHNIQUE: Multidetector CT imaging of the lumbar spine was performed without intravenous contrast administration. Multiplanar CT image reconstructions were also generated. COMPARISON:  None. FINDINGS: Segmentation: 5 lumbar type vertebrae. Alignment: Normal. Vertebrae: No acute fracture or focal pathologic process. Paraspinal and other soft tissues: There is gas in the paraspinal soft tissues with a 5.6 cm fluid  in the right multifidus muscle at the L4-5 level. There is gas in the right iliacus and psoas muscles and right gluteus minimus and medius muscles with expansion of those muscles with low-density consistent with abscess. There is also fluid and abnormal soft tissue in the presacral space extending through the right sciatic notch. There is a tiny bubble of air in the right hip joint. Cholelithiasis. 5 mm stone in the mid right kidney. Small right inguinal hernia containing only fat. Disc levels: T7-8 through L3-4: No appreciable disc protrusions. The discs at T9-10 and T11-12 are degenerated with vacuum phenomena. L4-5: Small broad-based disc bulge. Slight hypertrophy of the left ligamentum flavum creates narrowing of the spinal canal. The CT thecal sac appears compressed in the transverse dimension. L5-S1: Disc space narrowing asymmetric disc osteophyte complex to the left of midline. There appears to be abnormal soft tissue in the spinal canal at S1. Given the adjacent soft tissue infection, the possibility of infection in the spinal canal should be considered. MRI of the lower abdomen and pelvis and lumbar spine recommended for further evaluation of the findings to determine whether there is epidural extension of infection. IMPRESSION: 1. Extensive soft tissue  infection in bulging the right paraspinal muscles, right gluteal muscles, presacral space, and the soft tissues of the right sciatic notch. There is a definable abscess in the right multifidus muscle. 2. Subtle increased soft tissue in the spinal canal at S1. Asymmetric compression of the thecal sac at L4-5. The possibility of infection within the spinal canal should be considered. 3. MRI of the lumbar spine with and without contrast and MRI of the lower abdomen and pelvis with and without contrast recommended for further evaluation. Critical Value/emergent results were called by telephone at the time of interpretation on 07/06/2019 at 8:17 am to the attending ER physician , who verbally acknowledged these results. Electronically Signed   By: James  Maxwell M.D.   On: 07/06/2019 08:16  ° °Dg Chest Portable 1 View ° °Result Date: 07/06/2019 °CLINICAL DATA:  Shortness of breath. EXAM: PORTABLE CHEST 1 VIEW COMPARISON:  None. FINDINGS: Mild cardiomegaly. Normal mediastinal contours. No focal airspace disease, pleural effusion, or pneumothorax. No pulmonary edema. No acute osseous abnormalities. IMPRESSION: Mild cardiomegaly. No acute chest findings. Electronically Signed   By: Melanie  Sanford M.D.   On: 07/06/2019 04:10  ° ° °EKG: Independently reviewed.  Sinus tachycardia 118 bpm with signs of early T wave peaking. ° °Assessment/Plan °Sepsis secondary to abscess of the right hip: Acute.  Patient presents with pain in the lower back and right hip.  Found to have fever up to 102.6 °F with tachycardia and tachypnea.  Labs revealed WBC at the upper limit of normal at 10.4 and lactic acid elevated up to 3.  MRI revealing large abscess of the iliopsoas muscle and right gluteal muscle with signs of epidural abscess in the spinal canal at L3-S1.   Sepsis protocol was initiated and patient started on broad-spectrum antibiotics of vancomycin, clindamycin, and cefepime.  Suspect abscess was secondary to recent injection in the hip.   Neurosurgery was consulted, but recommended IR for drainage.  IR took the patient for drainage and removed 15 mL of brown purulent fluid from the lumbar paraspinal muscular abscess. °-Admit to a progressive bed °-Follow-up pending cultures  °-Continue empiric antibiotics of vancomycin, clindamycin, and cefepime °-Trend lactic acid levels °-Tylenol as needed for fever °-Appreciate neurosurgery and interventional radiology consultative services, we will follow-up for further recommendations °  °DKA type   type II: Acute.  Patient presents with elevated blood glucose of 524, CO2 21, and anion gap 18.  Labs also revealed a pseudohyponatremia when calculated for hyperglycemia.  Patient was started on glucose stabilizer protocol.  Home medications appear only to include metformin and he is not been on insulin. -Hypoglycemic protocol -Check hemoglobin A1c in a.m. -Hold metformin -Glucose stabilizer protocol with insulin drip -Serial BMPs  -Diabetic education -Transition to subcu insulin once medically appropriate  Hyperkalemia: Acute.  On admission potassium elevated to 6.1 -Continue IV fluids -Repeat check of i-STAT Chem-8 noted potassium with in normal limits   Acute kidney injury: Patient presents with creatinine elevated up to 1.36 and BUN 21.  Previous baseline creatinine noted to be 0.91 last month. -Continue IV fluids this seen above -Continue to monitor kidney function  Essential hypertension: On admission blood pressures elevated up to to 166/86.  Home medications include hydralazine. -Continue hydralazine  Morbid obesity: BMI 42.64 kg/m -Continue discussed need of weight loss  OSA: Patient currently not on CPAP at night. -Recommend outpatient follow-up to be reevaluated  Smokeless tobacco use -Counsel on need of cessation of smokeless tobacco  DVT prophylaxis: lovenox Code Status: Full Family Communication: Discussed plan of care with the patient and family present at  bedside Disposition Plan: Likely discharge home once medically stable Consults called: Neurosurgery and interventional radiology Admission status:  Inpatient  Norval Morton MD [facility] Pager [phone]   If 7PM-7AM, please contact night-coverage [email or web address] Password [facility]  07/06/2019, 3:54 PM

## 2019-07-06 NOTE — Consult Note (Signed)
Chief Complaint: Patient was seen in consultation today for paraspinal muscle abscess  Referring Physician(s): Dr. Saintclair Halsted  Supervising Physician: Markus Daft  Patient Status: [facility] - In-pt  History of Present Illness: Bradley Cox is a 48 y.o. male with history of DM who developed right flank and back pain 3 weeks ago.  He was thought to have kidney stones after being evaluated at Urgent Care.  He received a toradol injection as well as a steroid dose pack however his pain did not improve.  He became more limited with decreased mobility eventually seeking further evaluation at [facility] ED today.   MR Pelvis: 1. As seen on earlier CT, there are multiple complex fluid collections in the right pelvis with internal air and peripheral enhancement consistent with multifocal abscesses. 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 2. Inflammation extends into the erector spinae musculature on the right. Lumbar spine findings are dictated separately. 3. Mild T2 hyperintensity within the proximal left common hamstring muscle. 4. Central heterogeneity within the prostate gland is removed from these other findings and probably due to BPH. Prostatitis could have this appearance.  MR Lumbar Spine: 1. Multiple areas of infection. Large areas of abscess formation in the right paraspinous muscle at the L4 level. Large abscess in the right iliopsoas muscle and also in the right gluteus muscle. 2. 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 This may have entered the spinal canal via the right S1 nerve root. No evidence of discitis or facet joint infection.  General and Neurosurgery both consulted and recommend IR aspiration and drainage.  Patient to be admitted by [facility] for ongoing evaluation and management.   Past Medical History:  Diagnosis  Date   Arthritis    Hypertension     No past surgical history on file.  Allergies: Penicillins and Tramadol  Medications: Prior to Admission medications   Medication Sig Start Date End Date Taking? Authorizing Provider  albuterol (VENTOLIN HFA) 108 (90 Base) MCG/ACT inhaler Inhale 1 puff into the lungs every 6 (six) hours as needed for wheezing or shortness of breath.    Yes [provider]  hydrALAZINE (APRESOLINE) 50 MG tablet Take 50 mg by mouth 2 (two) times daily. 06/21/15  Yes [provider]  ibuprofen (ADVIL) 200 MG tablet Take 600 mg by mouth every 6 (six) hours as needed for headache or moderate pain.   Yes [provider]  metFORMIN (GLUCOPHAGE) 500 MG tablet Take 1 tablet (500 mg total) by mouth 2 (two) times daily with a meal. 06/18/19 07/18/19 Yes Tacy Learn, PA-C  Multiple Vitamin (THERA) TABS Take 2 tablets by mouth daily.   Yes [provider]  blood glucose meter kit and supplies KIT Dispense based on patient and insurance preference. Use up to four times daily as directed. (FOR ICD-9 250.00, 250.01). 06/18/19   Tacy Learn, PA-C     No family history on file.  Social History   Socioeconomic History   Marital status: Single    Spouse name: Not on file   Number of children: Not on file   Years of education: Not on file   Highest education level: Not on file  Occupational History   Not on file  Social Needs   Financial resource strain: Not on file   Food insecurity    Worry: Not on file  Inability: Not on file   Transportation needs    Medical: Not on file    Non-medical: Not on file  Tobacco Use   Smoking status: Never Smoker   Smokeless tobacco: Current User    Types: Snuff  Substance and Sexual Activity   Alcohol use: Yes    Comment: rarely   Drug use: Never   Sexual activity: Not on file  Lifestyle   Physical activity    Days per week: Not on file    Minutes per session: Not on file    Stress: Not on file  Relationships   Social connections    Talks on phone: Not on file    Gets together: Not on file    Attends religious service: Not on file    Active member of club or organization: Not on file    Attends meetings of clubs or organizations: Not on file    Relationship status: Not on file  Other Topics Concern   Not on file  Social History Narrative   Not on file     Review of Systems: A 12 point ROS discussed and pertinent positives are indicated in the HPI above.  All other systems are negative.  Review of Systems  Constitutional: Negative for fatigue and fever.  Respiratory: Negative for cough and shortness of breath.   Cardiovascular: Negative for chest pain.  Gastrointestinal: Positive for abdominal pain.  Genitourinary: Negative for dysuria.  Musculoskeletal: Positive for back pain.  Psychiatric/Behavioral: Negative for behavioral problems and confusion.    Vital Signs: BP (!) 166/86    Pulse (!) 123    Temp 98.6 F (37 C) (Oral)    Resp 20    Ht '6\' 6"'  (1.981 m)    Wt (!) 369 lb (167.4 kg)    SpO2 98%    BMI 42.64 kg/m   Physical Exam Vitals signs and nursing note reviewed.  Constitutional:      Appearance: Normal appearance.  HENT:     Mouth/Throat:     Mouth: Mucous membranes are moist.     Pharynx: Oropharynx is clear.  Cardiovascular:     Rate and Rhythm: Normal rate and regular rhythm.  Pulmonary:     Effort: Pulmonary effort is normal. No respiratory distress.     Breath sounds: Normal breath sounds.  Abdominal:     General: Abdomen is flat.     Palpations: Abdomen is soft.  Musculoskeletal:        General: Tenderness (right flank and back) present.  Skin:    General: Skin is warm and dry.  Neurological:     General: No focal deficit present.     Mental Status: He is alert and oriented to person, place, and time. Mental status is at baseline.  Psychiatric:        Mood and Affect: Mood normal.        Behavior: Behavior normal.         Thought Content: Thought content normal.        Judgment: Judgment normal.      MD Evaluation Airway: WNL Heart: WNL Abdomen: WNL Chest/ Lungs: WNL ASA  Classification: 3 Mallampati/Airway Score: Two   Imaging: Mr Lumbar Spine W Wo Contrast  Result Date: 07/06/2019 CLINICAL DATA:  Infection.  Rule out epidural abscess. EXAM: MRI LUMBAR SPINE WITHOUT AND WITH CONTRAST TECHNIQUE: Multiplanar and multiecho pulse sequences of the lumbar spine were obtained without and with intravenous contrast. CONTRAST:  10 mL Gadovist IV COMPARISON:  CT abdomen pelvis in CT lumbar spine 07/06/2019 FINDINGS: Segmentation:  Normal Alignment:  Mild retrolisthesis L1-2 and L4-5 and L5-S1. Vertebrae: Negative for vertebral fracture or mass. No vertebral edema or abnormal enhancement. No evidence of discitis. Conus medullaris and cauda equina: Conus extends to the L1-2 level. Conus and cauda equina appear normal. Paraspinal and other soft tissues: Large enhancing fluid collection in the right paraspinous muscle at the L4 level measures 5.5 x 6 cm compatible with abscess. Adjacent facet joints show mild degeneration but no definite infection. Multilocular large fluid collection in the right iliopsoas muscle compatible with abscess. This measures at least 5 x 6 cm. Large multilocular fluid collection in the right gluteus muscle measures at least 5 cm. Abscess in the iliopsoas and gluteus muscle appear to communicate through the sciatic notch. Disc levels: L1-2: Disc and facet degeneration. Shallow left-sided disc protrusion with mild subarticular stenosis on the left. L2-3: Negative L3-4: Normal disc space. Small epidural abscess posteriorly on the left. Bilateral facet degeneration without evidence of facet joint infection. L4-5: Mild retrolisthesis. Disc degeneration and facet degeneration. Small extraforaminal disc protrusion on the left. Epidural abscess posteriorly on the left compressing the thecal sac and  contributing to moderate spinal stenosis. No evidence of facet joint infection. L5-S1: Disc degeneration with left-sided disc protrusion. Posterior epidural abscess is ill-defined but is contributing to compression of thecal sac. There is enhancement and fluid along the right S1 nerve root which may be the route of spread to the epidural space. No evidence of facet infection. IMPRESSION: 1. Multiple areas of infection. Large areas of abscess formation in the right paraspinous muscle at the L4 level. Large abscess in the right iliopsoas muscle and also in the right gluteus muscle. 2. 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 This may have entered the spinal canal via the right S1 nerve root. No evidence of discitis or facet joint infection. Electronically Signed   By: Franchot Gallo M.D.   On: 07/06/2019 13:34   Mr Pelvis W Wo Contrast  Result Date: 07/06/2019 CLINICAL DATA:  Right-sided low back pain for several weeks. No recent injury. Recent antibiotic therapy for urinary tract infection. EXAM: MRI PELVIS WITHOUT AND WITH CONTRAST TECHNIQUE: Multiplanar multisequence MR imaging of the pelvis was performed both before and after administration of intravenous contrast. CONTRAST:  10 cc Gadavist COMPARISON:  Abdominopelvic CT 07/06/2019 and 06/18/2019. FINDINGS: Urinary Tract: The visualized distal ureters and bladder appear unremarkable. Bowel: No bowel wall thickening, distention or surrounding inflammation identified within the pelvis. Vascular/Lymphatic: No enlarged pelvic lymph nodes identified. No significant vascular findings. Reproductive: There are areas T2 hyperintensity and decreased enhancement centrally in the prostate gland bilaterally which may be secondary to benign prosthetic hypertrophy or prostatitis. There is no surrounding inflammatory change. The seminal vesicles appear normal. Other: No ascites. Musculoskeletal: As demonstrated on earlier CT scan, there are multiple complex fluid  collections surrounding the right sacroiliac joint. These demonstrate internal air bubbles, heterogeneity and irregular peripheral enhancement. Dominant component laterally in the gluteus musculature measures up to 9.8 x 6.1 cm on image 18/13. There is a component in the right issue femoral fossa measuring up to 5.2 cm on image 27/3. This demonstrates inferior extension around the right common hamstring tendon. There are intrapelvic components extending through the greater sciatic foramen, involving the right piriformis muscle and measuring up to 6.3 cm on image 13/12. There are also irregular components within the right iliopsoas muscle measuring up to 3.8 cm on  image 9/13. There are right posterior paraspinal components within the erector spinae musculature, measuring up to 6.2 cm on image 1/13. There is mild T2 hyperintensity and enhancement within the right sacroiliac joint, with associated marrow edema and enhancement in the adjacent right sacral ala. T2 hyperintensity and enhancement are also present within the left proximal common hamstring muscle. There are no focal fluid collections in the left pelvis. No significant hip joint effusion or abnormal synovial enhancement. IMPRESSION: 1. As seen on earlier CT, there are multiple complex fluid collections in the right pelvis with internal air and peripheral enhancement consistent with multifocal abscesses. 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 2. Inflammation extends into the erector spinae musculature on the right. Lumbar spine findings are dictated separately. 3. Mild T2 hyperintensity within the proximal left common hamstring muscle. 4. Central heterogeneity within the prostate gland is removed from these other findings and probably due to BPH. Prostatitis could have this appearance. Electronically Signed    By: Richardean Sale M.D.   On: 07/06/2019 13:32   Ct Abdomen Pelvis W Contrast  Addendum Date: 07/06/2019   ADDENDUM REPORT: 07/06/2019 08:20 ADDENDUM: Original report by Dr. Golden Circle. Addendum by Dr. Jeralyn Ruths for contrast extravasation documentation: Type of contrast:  Isovue 300 Site of extravasation: Right AC Estimated volume of extravasation: 40 ml Area of extravasation scanned with CT? no PATIENT'S SIGNS AND SYMPTOMS Skin blistering/ulceration: no Decrease capillary refill: no Change in skin color: no Decreased motor function or severe tightness: no Decreased pulses distal to site of extravasation: no Altered sensation: no Increasing pain or signs of increased swelling during observation: no TREATMENT An ice pack was applied with instructions given to the patient for limb elevation and warning signs to look for. The patient was returned to the emergency department. Electronically Signed   By: Logan Bores M.D.   On: 07/06/2019 08:20   Result Date: 07/06/2019 CLINICAL DATA:  Right-sided pain for several weeks, initial encounter EXAM: CT ABDOMEN AND PELVIS WITH CONTRAST TECHNIQUE: Multidetector CT imaging of the abdomen and pelvis was performed using the standard protocol following bolus administration of intravenous contrast. CONTRAST:  172m ISOVUE-300 IOPAMIDOL (ISOVUE-300) INJECTION 61% COMPARISON:  06/18/2019 FINDINGS: Lower chest: Lung bases are well aerated bilaterally. There is a vague nodular density identified on the first image within the right middle lobe. This area was not covered on the prior exam. Hepatobiliary: Liver is diffusely decreased in attenuation consistent with fatty infiltration. The gallbladder is decompressed. Scattered calcifications are noted within consistent with stones. No obstructive changes are noted. Pancreas: Unremarkable. No pancreatic ductal dilatation or surrounding inflammatory changes. Spleen: Normal in size without focal abnormality. Adrenals/Urinary Tract: Adrenal glands  are within normal limits. The kidneys are well visualized with renal calculi stable in appearance from the prior exam. The largest of these lies in the lower pole measuring approximately 9 mm. The left kidney demonstrates no renal calculi. Very mild prominence of the collecting system and ureter on the right are seen similar to that noted on the prior exam without obstructing lesion. The bladder is well distended. Stomach/Bowel: No obstructive or inflammatory changes of the colon are seen. The appendix is not well visualized. No obstructive or inflammatory changes of the small bowel are seen. The stomach is unremarkable. Vascular/Lymphatic: No significant vascular findings are present. No enlarged abdominal or pelvic lymph nodes. Reproductive: Prostate is unremarkable. Other: No  abdominal wall hernia or abnormality. No abdominopelvic ascites. Musculoskeletal: There are changes within the right buttock, right iliac fossa and perispinal musculature on the right with fluid collections and air foci identified consistent with localized infection. The fluid collection within the right paraspinal musculature measures 5.6 x 5.5 cm consistent with a focal abscess. Similar extension into the iliac fossa on the right as well as the musculature of the right buttocks is seen. Soft tissue density in the presacral region and along the right pelvic wall are noted consistent with focal localized infection as well. IMPRESSION: Changes consistent with localized abscess involving the right iliac fossa, musculature of the right buttocks and right paraspinal musculature with extension into the lateral aspect of the pelvis on the right. Percutaneous drainage of the paraspinal collection may be helpful. These changes are all new from the prior exam. Stable renal calculi on the right. Stable cholelithiasis and fatty infiltration of the liver. Electronically Signed: By: Inez Catalina M.D. On: 07/06/2019 07:55   Ct L-spine No  Charge  Result Date: 07/06/2019 CLINICAL DATA:  Right-sided back pain and right lower extremity pain for 3 weeks. EXAM: CT LUMBAR SPINE WITHOUT CONTRAST TECHNIQUE: Multidetector CT imaging of the lumbar spine was performed without intravenous contrast administration. Multiplanar CT image reconstructions were also generated. COMPARISON:  None. FINDINGS: Segmentation: 5 lumbar type vertebrae. Alignment: Normal. Vertebrae: No acute fracture or focal pathologic process. Paraspinal and other soft tissues: There is gas in the paraspinal soft tissues with a 5.6 cm fluid collection in the right multifidus muscle at the L4-5 level. There is gas in the right iliacus and psoas muscles and right gluteus minimus and medius muscles with expansion of those muscles with low-density consistent with abscess. There is also fluid and abnormal soft tissue in the presacral space extending through the right sciatic notch. There is a tiny bubble of air in the right hip joint. Cholelithiasis. 5 mm stone in the mid right kidney. Small right inguinal hernia containing only fat. Disc levels: T7-8 through L3-4: No appreciable disc protrusions. The discs at T9-10 and T11-12 are degenerated with vacuum phenomena. L4-5: Small broad-based disc bulge. Slight hypertrophy of the left ligamentum flavum creates narrowing of the spinal canal. The CT thecal sac appears compressed in the transverse dimension. L5-S1: Disc space narrowing asymmetric disc osteophyte complex to the left of midline. There appears to be abnormal soft tissue in the spinal canal at S1. Given the adjacent soft tissue infection, the possibility of infection in the spinal canal should be considered. MRI of the lower abdomen and pelvis and lumbar spine recommended for further evaluation of the findings to determine whether there is epidural extension of infection. IMPRESSION: 1. Extensive soft tissue infection in bulging the right paraspinal muscles, right gluteal muscles, presacral  space, and the soft tissues of the right sciatic notch. There is a definable abscess in the right multifidus muscle. 2. Subtle increased soft tissue in the spinal canal at S1. Asymmetric compression of the thecal sac at L4-5. The possibility of infection within the spinal canal should be considered. 3. MRI of the lumbar spine with and without contrast and MRI of the lower abdomen and pelvis with and without contrast recommended for further evaluation. Critical Value/emergent results were called by telephone at the time of interpretation on 07/06/2019 at 8:17 am to the attending ER physician , who verbally acknowledged these results. Electronically Signed   By: Lorriane Shire M.D.   On: 07/06/2019 08:16   Dg Chest Portable 1  View  Result Date: 07/06/2019 CLINICAL DATA:  Shortness of breath. EXAM: PORTABLE CHEST 1 VIEW COMPARISON:  None. FINDINGS: Mild cardiomegaly. Normal mediastinal contours. No focal airspace disease, pleural effusion, or pneumothorax. No pulmonary edema. No acute osseous abnormalities. IMPRESSION: Mild cardiomegaly. No acute chest findings. Electronically Signed   By: Keith Rake M.D.   On: 07/06/2019 04:10   Ct Renal Stone Study  Result Date: 06/18/2019 CLINICAL DATA:  Right flank pain radiating into right leg. Began Friday. Also having hematuria EXAM: CT ABDOMEN AND PELVIS WITHOUT CONTRAST TECHNIQUE: Multidetector CT imaging of the abdomen and pelvis was performed following the standard protocol without IV contrast. COMPARISON:  None. FINDINGS: Lower chest: Minimal scarring at both lung bases. There is coronary artery calcification of the visualized portion of the heart. Hepatobiliary: Liver is diffusely low attenuation, consistent with hepatic steatosis. Rounded contour the liver and prominent caudate lobe raise the question of cirrhosis. The gallbladder is contracted and contains numerous calcifications, likely representing calcified stones. Pancreas: Unremarkable. No pancreatic  ductal dilatation or surrounding inflammatory changes. Spleen: Normal in size without focal abnormality. Adrenals/Urinary Tract: There are 2 intrarenal calculi in the mid and LOWER pole regions of the RIGHT kidney, largest measuring 0.6 centimeters. The LEFT kidney is unremarkable. There is mild stranding surrounding the RIGHT ureter, not associated with obstruction or ureteral stone. The LEFT ureter is unremarkable. Visualized portion of the urinary bladder is unremarkable. Stomach/Bowel: The stomach and small bowel loops are normal in appearance. There are numerous colonic diverticula but no acute diverticulitis. Average stool burden. Normal appearance of the appendix. Vascular/Lymphatic: No significant vascular findings are present. No enlarged abdominal or pelvic lymph nodes. Reproductive: Prostate is unremarkable. Other: Fat containing bilateral inguinal hernias, RIGHT greater than LEFT. Musculoskeletal: Degenerative changes in the thoracic and lumbar spine. IMPRESSION: 1. Nonobstructing RIGHT intrarenal calculi. 2. Mild stranding surrounding the RIGHT ureter, without ureteral stone raising the question of a recently passed ureteral calculus. No bladder calculi identified. 3. Hepatic steatosis and possible cirrhosis. 4. Cholelithiasis.  Contracted gallbladder. 5. Colonic diverticulosis. 6. Fat containing bilateral inguinal hernias, RIGHT greater than LEFT. 7. Coronary artery disease. Electronically Signed   By: Nolon Nations M.D.   On: 06/18/2019 15:39    Labs:  CBC: Recent Labs    06/18/19 1200 07/06/19 0359  WBC 10.0 10.4  HGB 14.0 14.0  HCT 42.3 42.6  PLT 187 256    COAGS: No results for input(s): INR, APTT in the last 8760 hours.  BMP: Recent Labs    06/18/19 1200 07/06/19 0526  NA 131* 127*  K 4.5 6.1*  CL 93* 88*  CO2 26 21*  GLUCOSE 455* 524*  BUN 17 21*  CALCIUM 9.8 9.2  CREATININE 0.91 1.36*  GFRNONAA >60 >60  GFRAA >60 >60    LIVER FUNCTION TESTS: Recent Labs     07/06/19 0526  BILITOT 1.0  AST 25  ALT 19  ALKPHOS 84  PROT 7.7  ALBUMIN 1.9*    TUMOR MARKERS: No results for input(s): AFPTM, CEA, CA199, CHROMGRNA in the last 8760 hours.  Assessment and Plan: Paraspinal muscle abscess Patient with flank and back pain.  Found to have multiple infections in his back.  IR consulted for aspiration and drainage. Case reviewed by Dr. Anselm Pancoast. Patient NPO.  K 6.1 likely due to DKA on admission.  Patient has been getting insulin.  Repeat Chem 8 ordered.   Thank you for this interesting consult.  I greatly enjoyed meeting Jill Poling and look  forward to participating in their care.  A copy of this report was sent to the requesting provider on this date.  Electronically Signed: Docia Barrier, PA 07/06/2019, 5:19 PM   I spent a total of 40 Minutes    in face to face in clinical consultation, greater than 50% of which was counseling/coordinating care for paraspinal abscess.

## 2019-07-06 NOTE — Procedures (Signed)
Interventional Radiology Procedure:   Indications: Multiple areas of infection including right paraspinal muscle abscess at L4 level.  Procedure: CT aspiration of right paraspinal abscess  Findings: 15 ml of brown purulent fluid removed from lumbar paraspinal muscular abscess.    Complications: None     EBL: less than 10 ml  Plan: Return to ED and awaiting bed for admission. Send fluid for culture.     Juston Goheen R. Anselm Pancoast, MD  Pager: [phone]

## 2019-07-06 NOTE — Consult Note (Signed)
Reason for Consult: Back pain Referring Physician: Emergency department  Bradley Cox is an 48 y.o. male.  HPI: 48 year old gentleman presented with severe primarily right-sided back pain right leg pain work-up has revealed a large paraspinal abscess with extension towards the right L4-5 facet joint small amount of epidural fluid left of midline with mild to moderate amount of stenosis.  Past Medical History:  Diagnosis Date  . Arthritis   . Hypertension     No past surgical history on file.  No family history on file.  Social History:  reports that he has never smoked. His smokeless tobacco use includes snuff. He reports current alcohol use. He reports that he does not use drugs.  Allergies:  Allergies  Allergen Reactions  . Penicillins Anaphylaxis    Did it involve swelling of the face/tongue/throat, SOB, or low BP? Yes Did it involve sudden or severe rash/hives, skin peeling, or any reaction on the inside of your mouth or nose? No Did you need to seek medical attention at a hospital or doctor's office? Yes When did it last happen?4 months old If all above answers are "NO", may proceed with cephalosporin use.   . Tramadol Anaphylaxis    Medications: I have reviewed the patient's current medications.  Results for orders placed or performed during the hospital encounter of 07/05/19 (from the past 48 hour(s))  CBC with Differential     Status: Abnormal   Collection Time: 07/06/19  3:59 AM  Result Value Ref Range   WBC 10.4 4.0 - 10.5 K/uL   RBC 4.52 4.22 - 5.81 MIL/uL   Hemoglobin 14.0 13.0 - 17.0 g/dL   HCT 42.6 39.0 - 52.0 %   MCV 94.2 80.0 - 100.0 fL   MCH 31.0 26.0 - 34.0 pg   MCHC 32.9 30.0 - 36.0 g/dL   RDW 12.9 11.5 - 15.5 %   Platelets 256 150 - 400 K/uL    Comment: REPEATED TO VERIFY   nRBC 0.0 0.0 - 0.2 %   Neutrophils Relative % 81 %   Neutro Abs 8.6 (H) 1.7 - 7.7 K/uL   Lymphocytes Relative 8 %   Lymphs Abs 0.8 0.7 - 4.0 K/uL   Monocytes Relative 9 %    Monocytes Absolute 1.0 0.1 - 1.0 K/uL   Eosinophils Relative 0 %   Eosinophils Absolute 0.0 0.0 - 0.5 K/uL   Basophils Relative 1 %   Basophils Absolute 0.1 0.0 - 0.1 K/uL   Immature Granulocytes 1 %   Abs Immature Granulocytes 0.05 0.00 - 0.07 K/uL    Comment: Performed at [facility], [address], [place], [place] [address]  Lactic acid, plasma     Status: Abnormal   Collection Time: 07/06/19  3:59 AM  Result Value Ref Range   Lactic Acid, Venous 3.0 (HH) 0.5 - 1.9 mmol/L    Comment: CRITICAL RESULT CALLED TO, READ BACK BY AND VERIFIED WITH: M.BROOKS,RN 07/06/2019 0513 DAVISB Performed at [facility], [place] [address] [place], [place] [address]   Blood culture (routine x 2)     Status: None (Preliminary result)   Collection Time: 07/06/19  4:30 AM   Specimen: BLOOD  Result Value Ref Range   Specimen Description BLOOD SITE NOT SPECIFIED    Special Requests      BOTTLES DRAWN AEROBIC AND ANAEROBIC Blood Culture results may not be optimal due to an inadequate volume of blood received in culture bottles   Culture  NO GROWTH < 12 HOURS Performed at [place] [address], [place], [place] [address]    Report Status PENDING   Blood culture (routine x 2)     Status: None (Preliminary result)   Collection Time: 07/06/19  4:30 AM   Specimen: BLOOD  Result Value Ref Range   Specimen Description BLOOD SITE NOT SPECIFIED    Special Requests      BOTTLES DRAWN AEROBIC AND ANAEROBIC Blood Culture adequate volume   Culture      NO GROWTH < 12 HOURS Performed at [facility], [place] [address], [place], [place] [address]    Report Status PENDING   Urinalysis, Routine w reflex microscopic     Status: Abnormal   Collection Time: 07/06/19  4:37 AM  Result Value Ref Range   Color, Urine YELLOW YELLOW   APPearance CLEAR CLEAR   Specific Gravity, Urine 1.024 1.005 - 1.030   pH 5.0 5.0 - 8.0   Glucose, UA >=500 (A) NEGATIVE mg/dL   Hgb urine dipstick  MODERATE (A) NEGATIVE   Bilirubin Urine NEGATIVE NEGATIVE   Ketones, ur 80 (A) NEGATIVE mg/dL   Protein, ur NEGATIVE NEGATIVE mg/dL   Nitrite NEGATIVE NEGATIVE   Leukocytes,Ua NEGATIVE NEGATIVE   RBC / HPF 6-10 0 - 5 RBC/hpf   WBC, UA 0-5 0 - 5 WBC/hpf   Bacteria, UA RARE (A) NONE SEEN   Mucus PRESENT    Budding Yeast PRESENT     Comment: Performed at [facility], [address] [place], [place] [address]  Comprehensive metabolic panel     Status: Abnormal   Collection Time: 07/06/19  5:26 AM  Result Value Ref Range   Sodium 127 (L) 135 - 145 mmol/L   Potassium 6.1 (H) 3.5 - 5.1 mmol/L   Chloride 88 (L) 98 - 111 mmol/L   CO2 21 (L) 22 - 32 mmol/L   Glucose, Bld 524 (HH) 70 - 99 mg/dL    Comment: CRITICAL RESULT CALLED TO, READ BACK BY AND VERIFIED WITH: BROOKS,M RN @0645  ON [id number] BY FLEMINGS    BUN 21 (H) 6 - 20 mg/dL   Creatinine, Ser 1.36 (H) 0.61 - 1.24 mg/dL   Calcium 9.2 8.9 - 10.3 mg/dL   Total Protein 7.7 6.5 - 8.1 g/dL   Albumin 1.9 (L) 3.5 - 5.0 g/dL   AST 25 15 - 41 U/L   ALT 19 0 - 44 U/L   Alkaline Phosphatase 84 38 - 126 U/L   Total Bilirubin 1.0 0.3 - 1.2 mg/dL   GFR calc non Af Amer >60 >60 mL/min   GFR calc Af Amer >60 >60 mL/min   Anion gap 18 (H) 5 - 15    Comment: Performed at [place] [address], [place], [place] [address]  SARS Coronavirus 2 [facility] order, Performed in [facility] hospital lab) Nasopharyngeal Nasopharyngeal Swab     Status: None   Collection Time: 07/06/19  8:04 AM   Specimen: Nasopharyngeal Swab  Result Value Ref Range   SARS Coronavirus 2 NEGATIVE NEGATIVE    Comment: (NOTE) If result is NEGATIVE SARS-CoV-2 target nucleic acids are NOT DETECTED. The SARS-CoV-2 RNA is generally detectable in upper and lower  respiratory specimens during the acute phase of infection. The lowest  concentration of SARS-CoV-2 viral copies this assay can detect is 250  copies / mL. A negative result does not preclude  SARS-CoV-2 infection  and should not be used  as the sole basis for treatment or other  patient management decisions.  A negative result may occur with  improper specimen collection / handling, submission of specimen other  than nasopharyngeal swab, presence of viral mutation(s) within the  areas targeted by this assay, and inadequate number of viral copies  (<250 copies / mL). A negative result must be combined with clinical  observations, patient history, and epidemiological information. If result is POSITIVE SARS-CoV-2 target nucleic acids are DETECTED. The SARS-CoV-2 RNA is generally detectable in upper and lower  respiratory specimens dur ing the acute phase of infection.  Positive  results are indicative of active infection with SARS-CoV-2.  Clinical  correlation with patient history and other diagnostic information is  necessary to determine patient infection status.  Positive results do  not rule out bacterial infection or co-infection with other viruses. If result is PRESUMPTIVE POSTIVE SARS-CoV-2 nucleic acids MAY BE PRESENT.   A presumptive positive result was obtained on the submitted specimen  and confirmed on repeat testing.  While 2019 novel coronavirus  (SARS-CoV-2) nucleic acids may be present in the submitted sample  additional confirmatory testing may be necessary for epidemiological  and / or clinical management purposes  to differentiate between  SARS-CoV-2 and other Sarbecovirus currently known to infect humans.  If clinically indicated additional testing with an alternate test  methodology [phone]) is advised. The SARS-CoV-2 RNA is generally  detectable in upper and lower respiratory sp ecimens during the acute  phase of infection. The expected result is Negative. Fact Sheet for Patients:  StrictlyIdeas.no Fact Sheet for Healthcare Providers: BankingDealers.co.za This test is not yet approved or cleared by the  [place] FDA and has been authorized for detection and/or diagnosis of SARS-CoV-2 by FDA under an Emergency Use Authorization (EUA).  This EUA will remain in effect (meaning this test can be used) for the duration of the COVID-19 declaration under Section 564(b)(1) of the Act, 21 U.S.C. section 360bbb-3(b)(1), unless the authorization is terminated or revoked sooner. Performed at [facility], [place] [address], [place], [place] [address]   CBG monitoring, ED     Status: Abnormal   Collection Time: 07/06/19  1:27 PM  Result Value Ref Range   Glucose-Capillary 420 (H) 70 - 99 mg/dL   Comment 1 Notify RN    Comment 2 Document in Chart   Lactic acid, plasma     Status: Abnormal   Collection Time: 07/06/19  2:45 PM  Result Value Ref Range   Lactic Acid, Venous 2.2 (HH) 0.5 - 1.9 mmol/L    Comment: CRITICAL VALUE NOTED.  VALUE IS CONSISTENT WITH PREVIOUSLY REPORTED AND CALLED VALUE. Performed at [facility], [place] [address], [place],  [address]   CBG monitoring, ED     Status: Abnormal   Collection Time: 07/06/19  3:27 PM  Result Value Ref Range   Glucose-Capillary 370 (H) 70 - 99 mg/dL    Mr Lumbar Spine W Wo Contrast  Result Date: 07/06/2019 CLINICAL DATA:  Infection.  Rule out epidural abscess. EXAM: MRI LUMBAR SPINE WITHOUT AND WITH CONTRAST TECHNIQUE: Multiplanar and multiecho pulse sequences of the lumbar spine were obtained without and with intravenous contrast. CONTRAST:  10 mL Gadovist IV COMPARISON:  CT abdomen pelvis in CT lumbar spine 07/06/2019 FINDINGS: Segmentation:  Normal Alignment:  Mild retrolisthesis L1-2 and L4-5 and L5-S1. Vertebrae: Negative for vertebral fracture or mass. No vertebral edema or abnormal enhancement. No evidence of discitis. Conus medullaris and cauda equina:  Conus extends to the L1-2 level. Conus and cauda equina appear normal. Paraspinal and other soft tissues: Large enhancing fluid collection in the right paraspinous muscle at  the L4 level measures 5.5 x 6 cm compatible with abscess. Adjacent facet joints show mild degeneration but no definite infection. Multilocular large fluid collection in the right iliopsoas muscle compatible with abscess. This measures at least 5 x 6 cm. Large multilocular fluid collection in the right gluteus muscle measures at least 5 cm. Abscess in the iliopsoas and gluteus muscle appear to communicate through the sciatic notch. Disc levels: L1-2: Disc and facet degeneration. Shallow left-sided disc protrusion with mild subarticular stenosis on the left. L2-3: Negative L3-4: Normal disc space. Small epidural abscess posteriorly on the left. Bilateral facet degeneration without evidence of facet joint infection. L4-5: Mild retrolisthesis. Disc degeneration and facet degeneration. Small extraforaminal disc protrusion on the left. Epidural abscess posteriorly on the left compressing the thecal sac and contributing to moderate spinal stenosis. No evidence of facet joint infection. L5-S1: Disc degeneration with left-sided disc protrusion. Posterior epidural abscess is ill-defined but is contributing to compression of thecal sac. There is enhancement and fluid along the right S1 nerve root which may be the route of spread to the epidural space. No evidence of facet infection. IMPRESSION: 1. Multiple areas of infection. Large areas of abscess formation in the right paraspinous muscle at the L4 level. Large abscess in the right iliopsoas muscle and also in the right gluteus muscle. 2. 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 This may have entered the spinal canal via the right S1 nerve root. No evidence of discitis or facet joint infection. Electronically Signed   By: Franchot Gallo M.D.   On: 07/06/2019 13:34   Mr Pelvis W Wo Contrast  Result Date: 07/06/2019 CLINICAL DATA:  Right-sided low back pain for several weeks. No recent injury. Recent antibiotic therapy for urinary tract infection. EXAM: MRI  PELVIS WITHOUT AND WITH CONTRAST TECHNIQUE: Multiplanar multisequence MR imaging of the pelvis was performed both before and after administration of intravenous contrast. CONTRAST:  10 cc Gadavist COMPARISON:  Abdominopelvic CT 07/06/2019 and 06/18/2019. FINDINGS: Urinary Tract: The visualized distal ureters and bladder appear unremarkable. Bowel: No bowel wall thickening, distention or surrounding inflammation identified within the pelvis. Vascular/Lymphatic: No enlarged pelvic lymph nodes identified. No significant vascular findings. Reproductive: There are areas T2 hyperintensity and decreased enhancement centrally in the prostate gland bilaterally which may be secondary to benign prosthetic hypertrophy or prostatitis. There is no surrounding inflammatory change. The seminal vesicles appear normal. Other: No ascites. Musculoskeletal: As demonstrated on earlier CT scan, there are multiple complex fluid collections surrounding the right sacroiliac joint. These demonstrate internal air bubbles, heterogeneity and irregular peripheral enhancement. Dominant component laterally in the gluteus musculature measures up to 9.8 x 6.1 cm on image 18/13. There is a component in the right issue femoral fossa measuring up to 5.2 cm on image 27/3. This demonstrates inferior extension around the right common hamstring tendon. There are intrapelvic components extending through the greater sciatic foramen, involving the right piriformis muscle and measuring up to 6.3 cm on image 13/12. There are also irregular components within the right iliopsoas muscle measuring up to 3.8 cm on image 9/13. There are right posterior paraspinal components within the erector spinae musculature, measuring up to 6.2 cm on image 1/13. There is mild T2 hyperintensity and enhancement within the right sacroiliac joint, with associated marrow edema and enhancement in the adjacent right  sacral ala. T2 hyperintensity and enhancement are also present within  the left proximal common hamstring muscle. There are no focal fluid collections in the left pelvis. No significant hip joint effusion or abnormal synovial enhancement. IMPRESSION: 1. As seen on earlier CT, there are multiple complex fluid collections in the right pelvis with internal air and peripheral enhancement consistent with multifocal abscesses. 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 2. Inflammation extends into the erector spinae musculature on the right. Lumbar spine findings are dictated separately. 3. Mild T2 hyperintensity within the proximal left common hamstring muscle. 4. Central heterogeneity within the prostate gland is removed from these other findings and probably due to BPH. Prostatitis could have this appearance. Electronically Signed   By: Richardean Sale M.D.   On: 07/06/2019 13:32   Ct Abdomen Pelvis W Contrast  Addendum Date: 07/06/2019   ADDENDUM REPORT: 07/06/2019 08:20 ADDENDUM: Original report by Dr. Golden Circle. Addendum by Dr. Jeralyn Ruths for contrast extravasation documentation: Type of contrast:  Isovue 300 Site of extravasation: Right AC Estimated volume of extravasation: 40 ml Area of extravasation scanned with CT? no PATIENT'S SIGNS AND SYMPTOMS Skin blistering/ulceration: no Decrease capillary refill: no Change in skin color: no Decreased motor function or severe tightness: no Decreased pulses distal to site of extravasation: no Altered sensation: no Increasing pain or signs of increased swelling during observation: no TREATMENT An ice pack was applied with instructions given to the patient for limb elevation and warning signs to look for. The patient was returned to the emergency department. Electronically Signed   By: Logan Bores M.D.   On: 07/06/2019 08:20   Result Date: 07/06/2019 CLINICAL DATA:  Right-sided pain for several weeks,  initial encounter EXAM: CT ABDOMEN AND PELVIS WITH CONTRAST TECHNIQUE: Multidetector CT imaging of the abdomen and pelvis was performed using the standard protocol following bolus administration of intravenous contrast. CONTRAST:  117mL ISOVUE-300 IOPAMIDOL (ISOVUE-300) INJECTION 61% COMPARISON:  06/18/2019 FINDINGS: Lower chest: Lung bases are well aerated bilaterally. There is a vague nodular density identified on the first image within the right middle lobe. This area was not covered on the prior exam. Hepatobiliary: Liver is diffusely decreased in attenuation consistent with fatty infiltration. The gallbladder is decompressed. Scattered calcifications are noted within consistent with stones. No obstructive changes are noted. Pancreas: Unremarkable. No pancreatic ductal dilatation or surrounding inflammatory changes. Spleen: Normal in size without focal abnormality. Adrenals/Urinary Tract: Adrenal glands are within normal limits. The kidneys are well visualized with renal calculi stable in appearance from the prior exam. The largest of these lies in the lower pole measuring approximately 9 mm. The left kidney demonstrates no renal calculi. Very mild prominence of the collecting system and ureter on the right are seen similar to that noted on the prior exam without obstructing lesion. The bladder is well distended. Stomach/Bowel: No obstructive or inflammatory changes of the colon are seen. The appendix is not well visualized. No obstructive or inflammatory changes of the small bowel are seen. The stomach is unremarkable. Vascular/Lymphatic: No significant vascular findings are present. No enlarged abdominal or pelvic lymph nodes. Reproductive: Prostate is unremarkable. Other: No abdominal wall hernia or abnormality. No abdominopelvic ascites. Musculoskeletal: There are changes within the right buttock, right iliac fossa and perispinal musculature on the right with fluid collections and air foci identified  consistent with localized infection. The fluid collection within the right  paraspinal musculature measures 5.6 x 5.5 cm consistent with a focal abscess. Similar extension into the iliac fossa on the right as well as the musculature of the right buttocks is seen. Soft tissue density in the presacral region and along the right pelvic wall are noted consistent with focal localized infection as well. IMPRESSION: Changes consistent with localized abscess involving the right iliac fossa, musculature of the right buttocks and right paraspinal musculature with extension into the lateral aspect of the pelvis on the right. Percutaneous drainage of the paraspinal collection may be helpful. These changes are all new from the prior exam. Stable renal calculi on the right. Stable cholelithiasis and fatty infiltration of the liver. Electronically Signed: By: Inez Catalina M.D. On: 07/06/2019 07:55   Ct L-spine No Charge  Result Date: 07/06/2019 CLINICAL DATA:  Right-sided back pain and right lower extremity pain for 3 weeks. EXAM: CT LUMBAR SPINE WITHOUT CONTRAST TECHNIQUE: Multidetector CT imaging of the lumbar spine was performed without intravenous contrast administration. Multiplanar CT image reconstructions were also generated. COMPARISON:  None. FINDINGS: Segmentation: 5 lumbar type vertebrae. Alignment: Normal. Vertebrae: No acute fracture or focal pathologic process. Paraspinal and other soft tissues: There is gas in the paraspinal soft tissues with a 5.6 cm fluid collection in the right multifidus muscle at the L4-5 level. There is gas in the right iliacus and psoas muscles and right gluteus minimus and medius muscles with expansion of those muscles with low-density consistent with abscess. There is also fluid and abnormal soft tissue in the presacral space extending through the right sciatic notch. There is a tiny bubble of air in the right hip joint. Cholelithiasis. 5 mm stone in the mid right kidney. Small right  inguinal hernia containing only fat. Disc levels: T7-8 through L3-4: No appreciable disc protrusions. The discs at T9-10 and T11-12 are degenerated with vacuum phenomena. L4-5: Small broad-based disc bulge. Slight hypertrophy of the left ligamentum flavum creates narrowing of the spinal canal. The CT thecal sac appears compressed in the transverse dimension. L5-S1: Disc space narrowing asymmetric disc osteophyte complex to the left of midline. There appears to be abnormal soft tissue in the spinal canal at S1. Given the adjacent soft tissue infection, the possibility of infection in the spinal canal should be considered. MRI of the lower abdomen and pelvis and lumbar spine recommended for further evaluation of the findings to determine whether there is epidural extension of infection. IMPRESSION: 1. Extensive soft tissue infection in bulging the right paraspinal muscles, right gluteal muscles, presacral space, and the soft tissues of the right sciatic notch. There is a definable abscess in the right multifidus muscle. 2. Subtle increased soft tissue in the spinal canal at S1. Asymmetric compression of the thecal sac at L4-5. The possibility of infection within the spinal canal should be considered. 3. MRI of the lumbar spine with and without contrast and MRI of the lower abdomen and pelvis with and without contrast recommended for further evaluation. Critical Value/emergent results were called by telephone at the time of interpretation on 07/06/2019 at 8:17 am to the attending ER physician , who verbally acknowledged these results. Electronically Signed   By: Lorriane Shire M.D.   On: 07/06/2019 08:16   Dg Chest Portable 1 View  Result Date: 07/06/2019 CLINICAL DATA:  Shortness of breath. EXAM: PORTABLE CHEST 1 VIEW COMPARISON:  None. FINDINGS: Mild cardiomegaly. Normal mediastinal contours. No focal airspace disease, pleural effusion, or pneumothorax. No pulmonary edema. No acute osseous abnormalities. IMPRESSION:  Mild  cardiomegaly. No acute chest findings. Electronically Signed   By: Keith Rake M.D.   On: 07/06/2019 04:10    Review of Systems  Musculoskeletal: Positive for back pain.  Neurological: Positive for tingling and sensory change.   Blood pressure (!) 166/86, pulse (!) 123, temperature 98.6 F (37 C), temperature source Oral, resp. rate 20, height 6\' 6"  (1.981 m), weight (!) 167.4 kg, SpO2 98 %. Physical Exam  Constitutional: He is oriented to person, place, and time.  Neurological: He is alert and oriented to person, place, and time. GCS eye subscore is 4. GCS verbal subscore is 5. GCS motor subscore is 6.  Patient awake alert strength is somewhat pain limited but left lower extremity 5 out of 5 iliopsoas, quads, hamstrings, gastroc, into tibialis, and EHL.  Right lower extremity has pain limited exam proximally distally 5-5 dorsiflexion plantarflexion do think he is got 4-4+ weakness iliopsoas quad but this is severely pain limited o get a good exam.    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  Currently has primarily right-sided back pain and right leg pain but I do not believe he has any focal motor deficits the epidural component of the fluid is primarily leftward his left lower extremity is minimally painful and full strength with no deficits.  So do not think this requires urgent neurosurgery.  I have set him up for a CT-guided aspiration of the paraspinal component patient apparently is already been given a dose of antibiotics as part of the sepsis protocol.  Recommend admission to medicine appropriate tailoring of antibiotics and ID consult.  Alyscia Carmon P 07/06/2019, 4:13 PM

## 2019-07-06 NOTE — ED Provider Notes (Signed)
Patient with history of diabetes who presents with right flank pain and right back pain 3 weeks ago.  Initially seen at urgent care and subsequently diagnosed with having kidney stones.  During his visit he received a Toradol discharge on his right gluteal region and patient report it was very painful.  He was also prescribed a prednisone Dosepak.  He has been taking the medication as prescribed however pain has been progressively getting worse to the point that he is bedbound due to pain and having to use a walker to walk.  On initial exam by my partner, he has exquisite tenderness to his right flank and right gluteal region.  Area is firm and red.  CT scan ordered for further evaluation.  7:53 AM Labs remarkable for evidence of DKA with a glucose of 524, anion gap of 18, ketone 80.  Glucose stabilizer protocol has been initiated.  Sodium is 127 likely reflected pseudohyponatremia secondary to his underlying DKA.  Creatinine is 1.36.  Normal WBC however lactic acid is 3.  CTs of the abdomen and pelvis show findings consistent with localized abscess involving the right iliac fossa.   Dr. Ralene Bathe did spoke directly with radiologist who recommend Lumbar and Pelvis MRI W and Wout CM.   8:45 AM Appreciate consultation from General Surgery Dr. Georgette Dover who recommend medicine admission.  He suspect pt would benefit IR drainage and surgery can be consult if needed after MRI have resulted.    8:57 AM Patient in moderate discomfort, will provide additional pain medication.  He is aware that he will benefit from MRI and agrees.  Will consult medicine for admission.  2:35 PM Was a long delay in obtaining MRI.  MRI did results and showing multiple areas of infection which includes lots of abscesses in the right paraspinous muscle at the L4 level, right iliopsoas muscle and also right gluteus muscle.  There is also epidural abscess in the spinal canal at the level of L3-S1.  I did reach out and consulted general  surgery who felt that neurosurgeon would be best managed this infection.  Will consult neurosurgery.  3:15 PM Appreciate consultation from neurosurgeon Dr. Saintclair Halsted who request obtaining CRP, Sed rate, consult medicine for admission and he will see the pt.    3:44 PM Dr. Saintclair Halsted has seen and evaluated pt.  Felt he doesn't need neurosurgery intervention at this time.  He has reached out to IR for aspiration of R paraspinal.   3:53 PM Appreciate consultation from [place]. Tamala Julian who agrees to see and admit pt.    CRITICAL CARE Performed by: Domenic Moras Total critical care time: 65 minutes Critical care time was exclusive of separately billable procedures and treating other patients. Critical care was necessary to treat or prevent imminent or life-threatening deterioration. Critical care was time spent personally by me on the following activities: development of treatment plan with patient and/or surrogate as well as nursing, discussions with consultants, evaluation of patient's response to treatment, examination of patient, obtaining history from patient or surrogate, ordering and performing treatments and interventions, ordering and review of laboratory studies, ordering and review of radiographic studies, pulse oximetry and re-evaluation of patient's condition.  BP 116/74 (BP Location: Right Arm)   Pulse (!) 111   Temp 98.6 F (37 C) (Oral)   Resp 20   Ht 6\' 6"  (1.981 m)   Wt (!) 167.4 kg   SpO2 98%   BMI 42.64 kg/m   Results for orders placed or performed during  the hospital encounter of 07/05/19  Blood culture (routine x 2)   Specimen: BLOOD  Result Value Ref Range   Specimen Description BLOOD SITE NOT SPECIFIED    Special Requests      BOTTLES DRAWN AEROBIC AND ANAEROBIC Blood Culture results may not be optimal due to an inadequate volume of blood received in culture bottles   Culture      NO GROWTH < 12 HOURS Performed at [facility] [address]  [place], [place] [address]    Report Status PENDING   Blood culture (routine x 2)   Specimen: BLOOD  Result Value Ref Range   Specimen Description BLOOD SITE NOT SPECIFIED    Special Requests      BOTTLES DRAWN AEROBIC AND ANAEROBIC Blood Culture adequate volume   Culture      NO GROWTH < 12 HOURS Performed at [facility], [address], [place], [place] [address]    Report Status PENDING   SARS Coronavirus 2 [facility] order, Performed in [place] hospital lab) Nasopharyngeal Nasopharyngeal Swab   Specimen: Nasopharyngeal Swab  Result Value Ref Range   SARS Coronavirus 2 NEGATIVE NEGATIVE  CBC with Differential  Result Value Ref Range   WBC 10.4 4.0 - 10.5 K/uL   RBC 4.52 4.22 - 5.81 MIL/uL   Hemoglobin 14.0 13.0 - 17.0 g/dL   HCT 42.6 39.0 - 52.0 %   MCV 94.2 80.0 - 100.0 fL   MCH 31.0 26.0 - 34.0 pg   MCHC 32.9 30.0 - 36.0 g/dL   RDW 12.9 11.5 - 15.5 %   Platelets 256 150 - 400 K/uL   nRBC 0.0 0.0 - 0.2 %   Neutrophils Relative % 81 %   Neutro Abs 8.6 (H) 1.7 - 7.7 K/uL   Lymphocytes Relative 8 %   Lymphs Abs 0.8 0.7 - 4.0 K/uL   Monocytes Relative 9 %   Monocytes Absolute 1.0 0.1 - 1.0 K/uL   Eosinophils Relative 0 %   Eosinophils Absolute 0.0 0.0 - 0.5 K/uL   Basophils Relative 1 %   Basophils Absolute 0.1 0.0 - 0.1 K/uL   Immature Granulocytes 1 %   Abs Immature Granulocytes 0.05 0.00 - 0.07 K/uL  Urinalysis, Routine w reflex microscopic  Result Value Ref Range   Color, Urine YELLOW YELLOW   APPearance CLEAR CLEAR   Specific Gravity, Urine 1.024 1.005 - 1.030   pH 5.0 5.0 - 8.0   Glucose, UA >=500 (A) NEGATIVE mg/dL   Hgb urine dipstick MODERATE (A) NEGATIVE   Bilirubin Urine NEGATIVE NEGATIVE   Ketones, ur 80 (A) NEGATIVE mg/dL   Protein, ur NEGATIVE NEGATIVE mg/dL   Nitrite NEGATIVE NEGATIVE   Leukocytes,Ua NEGATIVE NEGATIVE   RBC / HPF 6-10 0 - 5 RBC/hpf   WBC, UA 0-5 0 - 5 WBC/hpf   Bacteria, UA RARE (A) NONE SEEN   Mucus PRESENT    Budding  Yeast PRESENT   Lactic acid, plasma  Result Value Ref Range   Lactic Acid, Venous 3.0 (HH) 0.5 - 1.9 mmol/L  Lactic acid, plasma  Result Value Ref Range   Lactic Acid, Venous 2.2 (HH) 0.5 - 1.9 mmol/L  Comprehensive metabolic panel  Result Value Ref Range   Sodium 127 (L) 135 - 145 mmol/L   Potassium 6.1 (H) 3.5 - 5.1 mmol/L   Chloride 88 (L) 98 - 111 mmol/L   CO2 21 (L) 22 - 32 mmol/L   Glucose, Bld 524 (HH) 70 -  99 mg/dL   BUN 21 (H) 6 - 20 mg/dL   Creatinine, Ser 1.36 (H) 0.61 - 1.24 mg/dL   Calcium 9.2 8.9 - 10.3 mg/dL   Total Protein 7.7 6.5 - 8.1 g/dL   Albumin 1.9 (L) 3.5 - 5.0 g/dL   AST 25 15 - 41 U/L   ALT 19 0 - 44 U/L   Alkaline Phosphatase 84 38 - 126 U/L   Total Bilirubin 1.0 0.3 - 1.2 mg/dL   GFR calc non Af Amer >60 >60 mL/min   GFR calc Af Amer >60 >60 mL/min   Anion gap 18 (H) 5 - 15  CBG monitoring, ED  Result Value Ref Range   Glucose-Capillary 420 (H) 70 - 99 mg/dL   Comment 1 Notify RN    Comment 2 Document in Chart   CBG monitoring, ED  Result Value Ref Range   Glucose-Capillary 370 (H) 70 - 99 mg/dL   Mr Lumbar Spine W Wo Contrast  Result Date: 07/06/2019 CLINICAL DATA:  Infection.  Rule out epidural abscess. EXAM: MRI LUMBAR SPINE WITHOUT AND WITH CONTRAST TECHNIQUE: Multiplanar and multiecho pulse sequences of the lumbar spine were obtained without and with intravenous contrast. CONTRAST:  10 mL Gadovist IV COMPARISON:  CT abdomen pelvis in CT lumbar spine 07/06/2019 FINDINGS: Segmentation:  Normal Alignment:  Mild retrolisthesis L1-2 and L4-5 and L5-S1. Vertebrae: Negative for vertebral fracture or mass. No vertebral edema or abnormal enhancement. No evidence of discitis. Conus medullaris and cauda equina: Conus extends to the L1-2 level. Conus and cauda equina appear normal. Paraspinal and other soft tissues: Large enhancing fluid collection in the right paraspinous muscle at the L4 level measures 5.5 x 6 cm compatible with abscess. Adjacent facet  joints show mild degeneration but no definite infection. Multilocular large fluid collection in the right iliopsoas muscle compatible with abscess. This measures at least 5 x 6 cm. Large multilocular fluid collection in the right gluteus muscle measures at least 5 cm. Abscess in the iliopsoas and gluteus muscle appear to communicate through the sciatic notch. Disc levels: L1-2: Disc and facet degeneration. Shallow left-sided disc protrusion with mild subarticular stenosis on the left. L2-3: Negative L3-4: Normal disc space. Small epidural abscess posteriorly on the left. Bilateral facet degeneration without evidence of facet joint infection. L4-5: Mild retrolisthesis. Disc degeneration and facet degeneration. Small extraforaminal disc protrusion on the left. Epidural abscess posteriorly on the left compressing the thecal sac and contributing to moderate spinal stenosis. No evidence of facet joint infection. L5-S1: Disc degeneration with left-sided disc protrusion. Posterior epidural abscess is ill-defined but is contributing to compression of thecal sac. There is enhancement and fluid along the right S1 nerve root which may be the route of spread to the epidural space. No evidence of facet infection. IMPRESSION: 1. Multiple areas of infection. Large areas of abscess formation in the right paraspinous muscle at the L4 level. Large abscess in the right iliopsoas muscle and also in the right gluteus muscle. 2. 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 This may have entered the spinal canal via the right S1 nerve root. No evidence of discitis or facet joint infection. Electronically Signed   By: Franchot Gallo M.D.   On: 07/06/2019 13:34   Mr Pelvis W Wo Contrast  Result Date: 07/06/2019 CLINICAL DATA:  Right-sided low back pain for several weeks. No recent injury. Recent antibiotic therapy for urinary tract infection. EXAM: MRI PELVIS WITHOUT AND WITH CONTRAST TECHNIQUE: Multiplanar multisequence MR  imaging of the pelvis was performed both before and after administration of intravenous contrast. CONTRAST:  10 cc Gadavist COMPARISON:  Abdominopelvic CT 07/06/2019 and 06/18/2019. FINDINGS: Urinary Tract: The visualized distal ureters and bladder appear unremarkable. Bowel: No bowel wall thickening, distention or surrounding inflammation identified within the pelvis. Vascular/Lymphatic: No enlarged pelvic lymph nodes identified. No significant vascular findings. Reproductive: There are areas T2 hyperintensity and decreased enhancement centrally in the prostate gland bilaterally which may be secondary to benign prosthetic hypertrophy or prostatitis. There is no surrounding inflammatory change. The seminal vesicles appear normal. Other: No ascites. Musculoskeletal: As demonstrated on earlier CT scan, there are multiple complex fluid collections surrounding the right sacroiliac joint. These demonstrate internal air bubbles, heterogeneity and irregular peripheral enhancement. Dominant component laterally in the gluteus musculature measures up to 9.8 x 6.1 cm on image 18/13. There is a component in the right issue femoral fossa measuring up to 5.2 cm on image 27/3. This demonstrates inferior extension around the right common hamstring tendon. There are intrapelvic components extending through the greater sciatic foramen, involving the right piriformis muscle and measuring up to 6.3 cm on image 13/12. There are also irregular components within the right iliopsoas muscle measuring up to 3.8 cm on image 9/13. There are right posterior paraspinal components within the erector spinae musculature, measuring up to 6.2 cm on image 1/13. There is mild T2 hyperintensity and enhancement within the right sacroiliac joint, with associated marrow edema and enhancement in the adjacent right sacral ala. T2 hyperintensity and enhancement are also present within the left proximal common hamstring muscle. There are no focal fluid  collections in the left pelvis. No significant hip joint effusion or abnormal synovial enhancement. IMPRESSION: 1. As seen on earlier CT, there are multiple complex fluid collections in the right pelvis with internal air and peripheral enhancement consistent with multifocal abscesses. 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 2. Inflammation extends into the erector spinae musculature on the right. Lumbar spine findings are dictated separately. 3. Mild T2 hyperintensity within the proximal left common hamstring muscle. 4. Central heterogeneity within the prostate gland is removed from these other findings and probably due to BPH. Prostatitis could have this appearance. Electronically Signed   By: Richardean Sale M.D.   On: 07/06/2019 13:32   Ct Abdomen Pelvis W Contrast  Addendum Date: 07/06/2019   ADDENDUM REPORT: 07/06/2019 08:20 ADDENDUM: Original report by Dr. Golden Circle. Addendum by Dr. Jeralyn Ruths for contrast extravasation documentation: Type of contrast:  Isovue 300 Site of extravasation: Right AC Estimated volume of extravasation: 40 ml Area of extravasation scanned with CT? no PATIENT'S SIGNS AND SYMPTOMS Skin blistering/ulceration: no Decrease capillary refill: no Change in skin color: no Decreased motor function or severe tightness: no Decreased pulses distal to site of extravasation: no Altered sensation: no Increasing pain or signs of increased swelling during observation: no TREATMENT An ice pack was applied with instructions given to the patient for limb elevation and warning signs to look for. The patient was returned to the emergency department. Electronically Signed   By: Logan Bores M.D.   On: 07/06/2019 08:20   Result Date: 07/06/2019 CLINICAL DATA:  Right-sided pain for several weeks, initial encounter EXAM: CT ABDOMEN AND PELVIS WITH CONTRAST TECHNIQUE:  Multidetector CT imaging of the abdomen and pelvis was performed using the standard protocol following bolus administration of intravenous contrast. CONTRAST:  111mL ISOVUE-300 IOPAMIDOL (ISOVUE-300) INJECTION 61% COMPARISON:  06/18/2019 FINDINGS: Lower chest: Lung bases are well aerated bilaterally. There is a vague nodular density identified on the first image within the right middle lobe. This area was not covered on the prior exam. Hepatobiliary: Liver is diffusely decreased in attenuation consistent with fatty infiltration. The gallbladder is decompressed. Scattered calcifications are noted within consistent with stones. No obstructive changes are noted. Pancreas: Unremarkable. No pancreatic ductal dilatation or surrounding inflammatory changes. Spleen: Normal in size without focal abnormality. Adrenals/Urinary Tract: Adrenal glands are within normal limits. The kidneys are well visualized with renal calculi stable in appearance from the prior exam. The largest of these lies in the lower pole measuring approximately 9 mm. The left kidney demonstrates no renal calculi. Very mild prominence of the collecting system and ureter on the right are seen similar to that noted on the prior exam without obstructing lesion. The bladder is well distended. Stomach/Bowel: No obstructive or inflammatory changes of the colon are seen. The appendix is not well visualized. No obstructive or inflammatory changes of the small bowel are seen. The stomach is unremarkable. Vascular/Lymphatic: No significant vascular findings are present. No enlarged abdominal or pelvic lymph nodes. Reproductive: Prostate is unremarkable. Other: No abdominal wall hernia or abnormality. No abdominopelvic ascites. Musculoskeletal: There are changes within the right buttock, right iliac fossa and perispinal musculature on the right with fluid collections and air foci identified consistent with localized infection. The fluid collection within the right  paraspinal musculature measures 5.6 x 5.5 cm consistent with a focal abscess. Similar extension into the iliac fossa on the right as well as the musculature of the right buttocks is seen. Soft tissue density in the presacral region and along the right pelvic wall are noted consistent with focal localized infection as well. IMPRESSION: Changes consistent with localized abscess involving the right iliac fossa, musculature of the right buttocks and right paraspinal musculature with extension into the lateral aspect of the pelvis on the right. Percutaneous drainage of the paraspinal collection may be helpful. These changes are all new from the prior exam. Stable renal calculi on the right. Stable cholelithiasis and fatty infiltration of the liver. Electronically Signed: By: Inez Catalina M.D. On: 07/06/2019 07:55   Ct L-spine No Charge  Result Date: 07/06/2019 CLINICAL DATA:  Right-sided back pain and right lower extremity pain for 3 weeks. EXAM: CT LUMBAR SPINE WITHOUT CONTRAST TECHNIQUE: Multidetector CT imaging of the lumbar spine was performed without intravenous contrast administration. Multiplanar CT image reconstructions were also generated. COMPARISON:  None. FINDINGS: Segmentation: 5 lumbar type vertebrae. Alignment: Normal. Vertebrae: No acute fracture or focal pathologic process. Paraspinal and other soft tissues: There is gas in the paraspinal soft tissues with a 5.6 cm fluid collection in the right multifidus muscle at the L4-5 level. There is gas in the right iliacus and psoas muscles and right gluteus minimus and medius muscles with expansion of those muscles with low-density consistent with abscess. There is also fluid and abnormal soft tissue in the presacral space extending through the right sciatic notch. There is a tiny bubble of air in the right hip joint. Cholelithiasis. 5 mm stone in the mid right kidney. Small right inguinal hernia containing only fat. Disc levels: T7-8 through L3-4: No  appreciable disc protrusions. The discs at T9-10 and T11-12 are degenerated with vacuum phenomena. L4-5: Small broad-based disc bulge. Slight hypertrophy of the left ligamentum flavum creates narrowing of the spinal canal. The CT thecal sac appears compressed  in the transverse dimension. L5-S1: Disc space narrowing asymmetric disc osteophyte complex to the left of midline. There appears to be abnormal soft tissue in the spinal canal at S1. Given the adjacent soft tissue infection, the possibility of infection in the spinal canal should be considered. MRI of the lower abdomen and pelvis and lumbar spine recommended for further evaluation of the findings to determine whether there is epidural extension of infection. IMPRESSION: 1. Extensive soft tissue infection in bulging the right paraspinal muscles, right gluteal muscles, presacral space, and the soft tissues of the right sciatic notch. There is a definable abscess in the right multifidus muscle. 2. Subtle increased soft tissue in the spinal canal at S1. Asymmetric compression of the thecal sac at L4-5. The possibility of infection within the spinal canal should be considered. 3. MRI of the lumbar spine with and without contrast and MRI of the lower abdomen and pelvis with and without contrast recommended for further evaluation. Critical Value/emergent results were called by telephone at the time of interpretation on 07/06/2019 at 8:17 am to the attending ER physician , who verbally acknowledged these results. Electronically Signed   By: Lorriane Shire M.D.   On: 07/06/2019 08:16   Dg Chest Portable 1 View  Result Date: 07/06/2019 CLINICAL DATA:  Shortness of breath. EXAM: PORTABLE CHEST 1 VIEW COMPARISON:  None. FINDINGS: Mild cardiomegaly. Normal mediastinal contours. No focal airspace disease, pleural effusion, or pneumothorax. No pulmonary edema. No acute osseous abnormalities. IMPRESSION: Mild cardiomegaly. No acute chest findings. Electronically Signed   By:  Keith Rake M.D.   On: 07/06/2019 04:10   Ct Renal Stone Study  Result Date: 06/18/2019 CLINICAL DATA:  Right flank pain radiating into right leg. Began Friday. Also having hematuria EXAM: CT ABDOMEN AND PELVIS WITHOUT CONTRAST TECHNIQUE: Multidetector CT imaging of the abdomen and pelvis was performed following the standard protocol without IV contrast. COMPARISON:  None. FINDINGS: Lower chest: Minimal scarring at both lung bases. There is coronary artery calcification of the visualized portion of the heart. Hepatobiliary: Liver is diffusely low attenuation, consistent with hepatic steatosis. Rounded contour the liver and prominent caudate lobe raise the question of cirrhosis. The gallbladder is contracted and contains numerous calcifications, likely representing calcified stones. Pancreas: Unremarkable. No pancreatic ductal dilatation or surrounding inflammatory changes. Spleen: Normal in size without focal abnormality. Adrenals/Urinary Tract: There are 2 intrarenal calculi in the mid and LOWER pole regions of the RIGHT kidney, largest measuring 0.6 centimeters. The LEFT kidney is unremarkable. There is mild stranding surrounding the RIGHT ureter, not associated with obstruction or ureteral stone. The LEFT ureter is unremarkable. Visualized portion of the urinary bladder is unremarkable. Stomach/Bowel: The stomach and small bowel loops are normal in appearance. There are numerous colonic diverticula but no acute diverticulitis. Average stool burden. Normal appearance of the appendix. Vascular/Lymphatic: No significant vascular findings are present. No enlarged abdominal or pelvic lymph nodes. Reproductive: Prostate is unremarkable. Other: Fat containing bilateral inguinal hernias, RIGHT greater than LEFT. Musculoskeletal: Degenerative changes in the thoracic and lumbar spine. IMPRESSION: 1. Nonobstructing RIGHT intrarenal calculi. 2. Mild stranding surrounding the RIGHT ureter, without ureteral stone  raising the question of a recently passed ureteral calculus. No bladder calculi identified. 3. Hepatic steatosis and possible cirrhosis. 4. Cholelithiasis.  Contracted gallbladder. 5. Colonic diverticulosis. 6. Fat containing bilateral inguinal hernias, RIGHT greater than LEFT. 7. Coronary artery disease. Electronically Signed   By: Nolon Nations M.D.   On: 06/18/2019 15:39      Domenic Moras,  PA-C 07/06/19 [id number]    Quintella Reichert, MD 07/07/19 1234

## 2019-07-06 NOTE — ED Notes (Signed)
Pt still in MRI 

## 2019-07-06 NOTE — Progress Notes (Signed)
PHARMACY - PHYSICIAN COMMUNICATION CRITICAL VALUE ALERT - BLOOD CULTURE IDENTIFICATION (BCID)  Bradley Cox is an 48 y.o. 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   Assessment: Blood cultures all positive for GPC. BCID shows MSSA.   Name of physician (or Provider) Contacted: Bodenheimer  Current antibiotics: Cefepime and Vancomycin   Changes to prescribed antibiotics recommended:  Due to spinal abscess will keep antibiotics broad for now.  Plan for possible IR drainage  Results for orders placed or performed during the hospital encounter of 07/05/19  Blood Culture ID Panel (Reflexed) (Collected: 07/06/2019  4:30 AM)  Result Value Ref Range   Enterococcus species NOT DETECTED NOT DETECTED   Listeria monocytogenes NOT DETECTED NOT DETECTED   Staphylococcus species DETECTED (A) NOT DETECTED   Staphylococcus aureus (BCID) DETECTED (A) NOT DETECTED   Methicillin resistance NOT DETECTED NOT DETECTED   Streptococcus species NOT DETECTED NOT DETECTED   Streptococcus agalactiae NOT DETECTED NOT DETECTED   Streptococcus pneumoniae NOT DETECTED NOT DETECTED   Streptococcus pyogenes NOT DETECTED NOT DETECTED   Acinetobacter baumannii NOT DETECTED NOT DETECTED   Enterobacteriaceae species NOT DETECTED NOT DETECTED   Enterobacter cloacae complex NOT DETECTED NOT DETECTED   Escherichia coli NOT DETECTED NOT DETECTED   Klebsiella oxytoca NOT DETECTED NOT DETECTED   Klebsiella pneumoniae NOT DETECTED NOT DETECTED   Proteus species NOT DETECTED NOT DETECTED   Serratia marcescens NOT DETECTED NOT DETECTED   Haemophilus influenzae NOT DETECTED NOT DETECTED   Neisseria meningitidis NOT DETECTED NOT DETECTED   Pseudomonas aeruginosa NOT DETECTED NOT DETECTED   Candida albicans NOT DETECTED  NOT DETECTED   Candida glabrata NOT DETECTED NOT DETECTED   Candida krusei NOT DETECTED NOT DETECTED   Candida parapsilosis NOT DETECTED NOT DETECTED   Candida tropicalis NOT DETECTED NOT DETECTED    Brain Hilts 07/06/2019  9:04 PM

## 2019-07-06 NOTE — ED Notes (Signed)
ED TO INPATIENT HANDOFF REPORT  ED Nurse Name and Phone #: Lovell Sheehan [phone]  S Name/Age/Gender Bradley Cox 48 y.o. male Room/Bed: 021C/021C  Code Status   Code Status: Full Code  Home/SNF/Other Home Patient oriented to: self, time, location, disposition Is this baseline? Yes   Triage Complete: Triage complete  Chief Complaint Sciatica Pain  Triage Note Pt presents with Right back pain radiating down his Right leg x3 weeks.    Allergies Allergies  Allergen Reactions  . Penicillins Anaphylaxis    Did it involve swelling of the face/tongue/throat, SOB, or low BP? Yes Did it involve sudden or severe rash/hives, skin peeling, or any reaction on the inside of your mouth or nose? No Did you need to seek medical attention at a hospital or doctor's office? Yes When did it last happen?4 months old If all above answers are "NO", may proceed with cephalosporin use.   . Tramadol Anaphylaxis    Level of Care/Admitting Diagnosis ED Disposition    ED Disposition Condition [facility] Area: [place] [[id number]]  Level of Care: Progressive [102]  Covid Evaluation: Confirmed COVID Negative  [facility]: [facility]is [id number]  Admitting Physician: Norval Morton [id number]  Attending Physician: Norval Morton [id number]  Estimated length of stay: past midnight tomorrow  Certification:: I certify this patient will need inpatient services for at least 2 midnights  PT Class (Do Not Modify): Inpatient [101]  PT Acc Code (Do Not Modify): Private [1]       B Medical/Surgery History Past Medical History:  Diagnosis Date  . Arthritis   . Asthma   . Diabetes mellitus, type 2 ([place])   . Hypertension   . Morbid obesity ([place])   . OSA (obstructive sleep apnea)    Past Surgical History:  Procedure Laterality Date  . HERNIA REPAIR     As a baby     A IV Location/Drains/Wounds Patient Lines/Drains/Airways Status   Active Line/Drains/Airways     Name:   Placement date:   Placement time:   Site:   Days:   Peripheral IV 07/06/19 Right Forearm   07/06/19    0610    Forearm   less than 1   Incision (Closed) 07/06/19 Back Lower;Right   07/06/19    1840     less than 1          Intake/Output Last 24 hours  Intake/Output Summary (Last 24 hours) at 07/06/2019 2030 Last data filed at 07/06/2019 1644 Gross per 24 hour  Intake 800 ml  Output -  Net 800 ml    Labs/Imaging Results for orders placed or performed during the hospital encounter of 07/05/19 (from the past 48 hour(s))  CBC with Differential     Status: Abnormal   Collection Time: 07/06/19  3:59 AM  Result Value Ref Range   WBC 10.4 4.0 - 10.5 K/uL   RBC 4.52 4.22 - 5.81 MIL/uL   Hemoglobin 14.0 13.0 - 17.0 g/dL   HCT 42.6 39.0 - 52.0 %   MCV 94.2 80.0 - 100.0 fL   MCH 31.0 26.0 - 34.0 pg   MCHC 32.9 30.0 - 36.0 g/dL   RDW 12.9 11.5 - 15.5 %   Platelets 256 150 - 400 K/uL    Comment: REPEATED TO VERIFY   nRBC 0.0 0.0 - 0.2 %   Neutrophils Relative % 81 %   Neutro Abs 8.6 (H) 1.7 - 7.7 K/uL   Lymphocytes Relative 8 %  Lymphs Abs 0.8 0.7 - 4.0 K/uL   Monocytes Relative 9 %   Monocytes Absolute 1.0 0.1 - 1.0 K/uL   Eosinophils Relative 0 %   Eosinophils Absolute 0.0 0.0 - 0.5 K/uL   Basophils Relative 1 %   Basophils Absolute 0.1 0.0 - 0.1 K/uL   Immature Granulocytes 1 %   Abs Immature Granulocytes 0.05 0.00 - 0.07 K/uL    Comment: Performed at [facility] [address] [place], [place] [address]  Lactic acid, plasma     Status: Abnormal   Collection Time: 07/06/19  3:59 AM  Result Value Ref Range   Lactic Acid, Venous 3.0 (HH) 0.5 - 1.9 mmol/L    Comment: CRITICAL RESULT CALLED TO, READ BACK BY AND VERIFIED WITH: M.BROOKS,RN 07/06/2019 0513 DAVISB Performed at [facility], [place] [address] [place], [place] [address]   Blood culture (routine x 2)     Status: None (Preliminary result)   Collection Time: 07/06/19  4:30 AM   Specimen: BLOOD   Result Value Ref Range   Specimen Description BLOOD SITE NOT SPECIFIED    Special Requests      BOTTLES DRAWN AEROBIC AND ANAEROBIC Blood Culture results may not be optimal due to an inadequate volume of blood received in culture bottles   Culture  Setup Time      AEROBIC BOTTLE ONLY GRAM POSITIVE COCCI Organism ID to follow    Culture      NO GROWTH < 12 HOURS Performed at [facility], [place] [address], [place], [place] [address]    Report Status PENDING   Blood culture (routine x 2)     Status: None (Preliminary result)   Collection Time: 07/06/19  4:30 AM   Specimen: BLOOD  Result Value Ref Range   Specimen Description BLOOD SITE NOT SPECIFIED    Special Requests      BOTTLES DRAWN AEROBIC AND ANAEROBIC Blood Culture adequate volume   Culture      NO GROWTH < 12 HOURS Performed at [facility], [place] [address], [place], [place] [address]    Report Status PENDING   Urinalysis, Routine w reflex microscopic     Status: Abnormal   Collection Time: 07/06/19  4:37 AM  Result Value Ref Range   Color, Urine YELLOW YELLOW   APPearance CLEAR CLEAR   Specific Gravity, Urine 1.024 1.005 - 1.030   pH 5.0 5.0 - 8.0   Glucose, UA >=500 (A) NEGATIVE mg/dL   Hgb urine dipstick MODERATE (A) NEGATIVE   Bilirubin Urine NEGATIVE NEGATIVE   Ketones, ur 80 (A) NEGATIVE mg/dL   Protein, ur NEGATIVE NEGATIVE mg/dL   Nitrite NEGATIVE NEGATIVE   Leukocytes,Ua NEGATIVE NEGATIVE   RBC / HPF 6-10 0 - 5 RBC/hpf   WBC, UA 0-5 0 - 5 WBC/hpf   Bacteria, UA RARE (A) NONE SEEN   Mucus PRESENT    Budding Yeast PRESENT     Comment: Performed at [facility], [address], [place], [place] [address]  Comprehensive metabolic panel     Status: Abnormal   Collection Time: 07/06/19  5:26 AM  Result Value Ref Range   Sodium 127 (L) 135 - 145 mmol/L   Potassium 6.1 (H) 3.5 - 5.1 mmol/L   Chloride 88 (L) 98 - 111 mmol/L   CO2 21 (L) 22 - 32 mmol/L   Glucose, Bld 524 (HH) 70 - 99 mg/dL     Comment: CRITICAL RESULT  CALLED TO, READ BACK BY AND VERIFIED WITH: BROOKS,M RN @0645  ON [id number] BY FLEMINGS    BUN 21 (H) 6 - 20 mg/dL   Creatinine, Ser 1.36 (H) 0.61 - 1.24 mg/dL   Calcium 9.2 8.9 - 10.3 mg/dL   Total Protein 7.7 6.5 - 8.1 g/dL   Albumin 1.9 (L) 3.5 - 5.0 g/dL   AST 25 15 - 41 U/L   ALT 19 0 - 44 U/L   Alkaline Phosphatase 84 38 - 126 U/L   Total Bilirubin 1.0 0.3 - 1.2 mg/dL   GFR calc non Af Amer >60 >60 mL/min   GFR calc Af Amer >60 >60 mL/min   Anion gap 18 (H) 5 - 15    Comment: Performed at [facility] [address] [place], [place] [address]  SARS Coronavirus 2 [facility] order, Performed in [facility] hospital lab) Nasopharyngeal Nasopharyngeal Swab     Status: None   Collection Time: 07/06/19  8:04 AM   Specimen: Nasopharyngeal Swab  Result Value Ref Range   SARS Coronavirus 2 NEGATIVE NEGATIVE    Comment: (NOTE) If result is NEGATIVE SARS-CoV-2 target nucleic acids are NOT DETECTED. The SARS-CoV-2 RNA is generally detectable in upper and lower  respiratory specimens during the acute phase of infection. The lowest  concentration of SARS-CoV-2 viral copies this assay can detect is 250  copies / mL. A negative result does not preclude SARS-CoV-2 infection  and should not be used as the sole basis for treatment or other  patient management decisions.  A negative result may occur with  improper specimen collection / handling, submission of specimen other  than nasopharyngeal swab, presence of viral mutation(s) within the  areas targeted by this assay, and inadequate number of viral copies  (<250 copies / mL). A negative result must be combined with clinical  observations, patient history, and epidemiological information. If result is POSITIVE SARS-CoV-2 target nucleic acids are DETECTED. The SARS-CoV-2 RNA is generally detectable in upper and lower  respiratory specimens dur ing the acute phase of infection.  Positive  results are indicative  of active infection with SARS-CoV-2.  Clinical  correlation with patient history and other diagnostic information is  necessary to determine patient infection status.  Positive results do  not rule out bacterial infection or co-infection with other viruses. If result is PRESUMPTIVE POSTIVE SARS-CoV-2 nucleic acids MAY BE PRESENT.   A presumptive positive result was obtained on the submitted specimen  and confirmed on repeat testing.  While 2019 novel coronavirus  (SARS-CoV-2) nucleic acids may be present in the submitted sample  additional confirmatory testing may be necessary for epidemiological  and / or clinical management purposes  to differentiate between  SARS-CoV-2 and other Sarbecovirus currently known to infect humans.  If clinically indicated additional testing with an alternate test  methodology [phone]) is advised. The SARS-CoV-2 RNA is generally  detectable in upper and lower respiratory sp ecimens during the acute  phase of infection. The expected result is Negative. Fact Sheet for Patients:  StrictlyIdeas.no Fact Sheet for Healthcare Providers: BankingDealers.co.za This test is not yet approved or cleared by the [place] FDA and has been authorized for detection and/or diagnosis of SARS-CoV-2 by FDA under an Emergency Use Authorization (EUA).  This EUA will remain in effect (meaning this test can be used) for the duration of the COVID-19 declaration under Section 564(b)(1) of the Act, 21 U.S.C. section 360bbb-3(b)(1), unless the authorization is terminated or revoked sooner. Performed at [facility]  [facility], [place] [address], [place], [place] [address]   CBG monitoring, ED     Status: Abnormal   Collection Time: 07/06/19  1:27 PM  Result Value Ref Range   Glucose-Capillary 420 (H) 70 - 99 mg/dL   Comment 1 Notify RN    Comment 2 Document in Chart   Lactic acid, plasma     Status: Abnormal   Collection Time: 07/06/19   2:45 PM  Result Value Ref Range   Lactic Acid, Venous 2.2 (HH) 0.5 - 1.9 mmol/L    Comment: CRITICAL VALUE NOTED.  VALUE IS CONSISTENT WITH PREVIOUSLY REPORTED AND CALLED VALUE. Performed at [facility], [place] [address], [place], [place] [address]   CBG monitoring, ED     Status: Abnormal   Collection Time: 07/06/19  3:27 PM  Result Value Ref Range   Glucose-Capillary 370 (H) 70 - 99 mg/dL  CBG monitoring, ED     Status: Abnormal   Collection Time: 07/06/19  4:38 PM  Result Value Ref Range   Glucose-Capillary 300 (H) 70 - 99 mg/dL  I-stat chem 8, ED (not at [facility] or [facility])     Status: Abnormal   Collection Time: 07/06/19  5:38 PM  Result Value Ref Range   Sodium 132 (L) 135 - 145 mmol/L   Potassium 4.6 3.5 - 5.1 mmol/L   Chloride 96 (L) 98 - 111 mmol/L   BUN 17 6 - 20 mg/dL   Creatinine, Ser 0.60 (L) 0.61 - 1.24 mg/dL   Glucose, Bld 338 (H) 70 - 99 mg/dL   Calcium, Ion 1.16 1.15 - 1.40 mmol/L   TCO2 25 22 - 32 mmol/L   Hemoglobin 12.6 (L) 13.0 - 17.0 g/dL   HCT 37.0 (L) 39.0 - 52.0 %  CBG monitoring, ED     Status: Abnormal   Collection Time: 07/06/19  5:51 PM  Result Value Ref Range   Glucose-Capillary 311 (H) 70 - 99 mg/dL  CBG monitoring, ED     Status: Abnormal   Collection Time: 07/06/19  6:59 PM  Result Value Ref Range   Glucose-Capillary 255 (H) 70 - 99 mg/dL  CBG monitoring, ED     Status: Abnormal   Collection Time: 07/06/19  8:16 PM  Result Value Ref Range   Glucose-Capillary 197 (H) 70 - 99 mg/dL   Mr Lumbar Spine W Wo Contrast  Result Date: 07/06/2019 CLINICAL DATA:  Infection.  Rule out epidural abscess. EXAM: MRI LUMBAR SPINE WITHOUT AND WITH CONTRAST TECHNIQUE: Multiplanar and multiecho pulse sequences of the lumbar spine were obtained without and with intravenous contrast. CONTRAST:  10 mL Gadovist IV COMPARISON:  CT abdomen pelvis in CT lumbar spine 07/06/2019 FINDINGS: Segmentation:  Normal Alignment:  Mild retrolisthesis L1-2 and L4-5 and L5-S1.  Vertebrae: Negative for vertebral fracture or mass. No vertebral edema or abnormal enhancement. No evidence of discitis. Conus medullaris and cauda equina: Conus extends to the L1-2 level. Conus and cauda equina appear normal. Paraspinal and other soft tissues: Large enhancing fluid collection in the right paraspinous muscle at the L4 level measures 5.5 x 6 cm compatible with abscess. Adjacent facet joints show mild degeneration but no definite infection. Multilocular large fluid collection in the right iliopsoas muscle compatible with abscess. This measures at least 5 x 6 cm. Large multilocular fluid collection in the right gluteus muscle measures at least 5 cm. Abscess in the iliopsoas and gluteus muscle appear to communicate through the sciatic notch. Disc levels: L1-2: Disc  and facet degeneration. Shallow left-sided disc protrusion with mild subarticular stenosis on the left. L2-3: Negative L3-4: Normal disc space. Small epidural abscess posteriorly on the left. Bilateral facet degeneration without evidence of facet joint infection. L4-5: Mild retrolisthesis. Disc degeneration and facet degeneration. Small extraforaminal disc protrusion on the left. Epidural abscess posteriorly on the left compressing the thecal sac and contributing to moderate spinal stenosis. No evidence of facet joint infection. L5-S1: Disc degeneration with left-sided disc protrusion. Posterior epidural abscess is ill-defined but is contributing to compression of thecal sac. There is enhancement and fluid along the right S1 nerve root which may be the route of spread to the epidural space. No evidence of facet infection. IMPRESSION: 1. Multiple areas of infection. Large areas of abscess formation in the right paraspinous muscle at the L4 level. Large abscess in the right iliopsoas muscle and also in the right gluteus muscle. 2. 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 This may have entered the spinal canal via the right S1  nerve root. No evidence of discitis or facet joint infection. Electronically Signed   By: Franchot Gallo M.D.   On: 07/06/2019 13:34   Mr Pelvis W Wo Contrast  Result Date: 07/06/2019 CLINICAL DATA:  Right-sided low back pain for several weeks. No recent injury. Recent antibiotic therapy for urinary tract infection. EXAM: MRI PELVIS WITHOUT AND WITH CONTRAST TECHNIQUE: Multiplanar multisequence MR imaging of the pelvis was performed both before and after administration of intravenous contrast. CONTRAST:  10 cc Gadavist COMPARISON:  Abdominopelvic CT 07/06/2019 and 06/18/2019. FINDINGS: Urinary Tract: The visualized distal ureters and bladder appear unremarkable. Bowel: No bowel wall thickening, distention or surrounding inflammation identified within the pelvis. Vascular/Lymphatic: No enlarged pelvic lymph nodes identified. No significant vascular findings. Reproductive: There are areas T2 hyperintensity and decreased enhancement centrally in the prostate gland bilaterally which may be secondary to benign prosthetic hypertrophy or prostatitis. There is no surrounding inflammatory change. The seminal vesicles appear normal. Other: No ascites. Musculoskeletal: As demonstrated on earlier CT scan, there are multiple complex fluid collections surrounding the right sacroiliac joint. These demonstrate internal air bubbles, heterogeneity and irregular peripheral enhancement. Dominant component laterally in the gluteus musculature measures up to 9.8 x 6.1 cm on image 18/13. There is a component in the right issue femoral fossa measuring up to 5.2 cm on image 27/3. This demonstrates inferior extension around the right common hamstring tendon. There are intrapelvic components extending through the greater sciatic foramen, involving the right piriformis muscle and measuring up to 6.3 cm on image 13/12. There are also irregular components within the right iliopsoas muscle measuring up to 3.8 cm on image 9/13. There are right  posterior paraspinal components within the erector spinae musculature, measuring up to 6.2 cm on image 1/13. There is mild T2 hyperintensity and enhancement within the right sacroiliac joint, with associated marrow edema and enhancement in the adjacent right sacral ala. T2 hyperintensity and enhancement are also present within the left proximal common hamstring muscle. There are no focal fluid collections in the left pelvis. No significant hip joint effusion or abnormal synovial enhancement. IMPRESSION: 1. As seen on earlier CT, there are multiple complex fluid collections in the right pelvis with internal air and peripheral enhancement consistent with multifocal abscesses. 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 2. Inflammation extends into the erector spinae musculature on the  right. Lumbar spine findings are dictated separately. 3. Mild T2 hyperintensity within the proximal left common hamstring muscle. 4. Central heterogeneity within the prostate gland is removed from these other findings and probably due to BPH. Prostatitis could have this appearance. Electronically Signed   By: Richardean Sale M.D.   On: 07/06/2019 13:32   Ct Abdomen Pelvis W Contrast  Addendum Date: 07/06/2019   ADDENDUM REPORT: 07/06/2019 08:20 ADDENDUM: Original report by Dr. Golden Circle. Addendum by Dr. Jeralyn Ruths for contrast extravasation documentation: Type of contrast:  Isovue 300 Site of extravasation: Right AC Estimated volume of extravasation: 40 ml Area of extravasation scanned with CT? no PATIENT'S SIGNS AND SYMPTOMS Skin blistering/ulceration: no Decrease capillary refill: no Change in skin color: no Decreased motor function or severe tightness: no Decreased pulses distal to site of extravasation: no Altered sensation: no Increasing pain or signs of increased swelling during observation:  no TREATMENT An ice pack was applied with instructions given to the patient for limb elevation and warning signs to look for. The patient was returned to the emergency department. Electronically Signed   By: Logan Bores M.D.   On: 07/06/2019 08:20   Result Date: 07/06/2019 CLINICAL DATA:  Right-sided pain for several weeks, initial encounter EXAM: CT ABDOMEN AND PELVIS WITH CONTRAST TECHNIQUE: Multidetector CT imaging of the abdomen and pelvis was performed using the standard protocol following bolus administration of intravenous contrast. CONTRAST:  130mL ISOVUE-300 IOPAMIDOL (ISOVUE-300) INJECTION 61% COMPARISON:  06/18/2019 FINDINGS: Lower chest: Lung bases are well aerated bilaterally. There is a vague nodular density identified on the first image within the right middle lobe. This area was not covered on the prior exam. Hepatobiliary: Liver is diffusely decreased in attenuation consistent with fatty infiltration. The gallbladder is decompressed. Scattered calcifications are noted within consistent with stones. No obstructive changes are noted. Pancreas: Unremarkable. No pancreatic ductal dilatation or surrounding inflammatory changes. Spleen: Normal in size without focal abnormality. Adrenals/Urinary Tract: Adrenal glands are within normal limits. The kidneys are well visualized with renal calculi stable in appearance from the prior exam. The largest of these lies in the lower pole measuring approximately 9 mm. The left kidney demonstrates no renal calculi. Very mild prominence of the collecting system and ureter on the right are seen similar to that noted on the prior exam without obstructing lesion. The bladder is well distended. Stomach/Bowel: No obstructive or inflammatory changes of the colon are seen. The appendix is not well visualized. No obstructive or inflammatory changes of the small bowel are seen. The stomach is unremarkable. Vascular/Lymphatic: No significant vascular findings are present. No  enlarged abdominal or pelvic lymph nodes. Reproductive: Prostate is unremarkable. Other: No abdominal wall hernia or abnormality. No abdominopelvic ascites. Musculoskeletal: There are changes within the right buttock, right iliac fossa and perispinal musculature on the right with fluid collections and air foci identified consistent with localized infection. The fluid collection within the right paraspinal musculature measures 5.6 x 5.5 cm consistent with a focal abscess. Similar extension into the iliac fossa on the right as well as the musculature of the right buttocks is seen. Soft tissue density in the presacral region and along the right pelvic wall are noted consistent with focal localized infection as well. IMPRESSION: Changes consistent with localized abscess involving the right iliac fossa, musculature of the right buttocks and right paraspinal musculature with extension into the lateral aspect of the pelvis on the right. Percutaneous drainage of the paraspinal collection may be helpful. These changes are all new from  the prior exam. Stable renal calculi on the right. Stable cholelithiasis and fatty infiltration of the liver. Electronically Signed: By: Inez Catalina M.D. On: 07/06/2019 07:55   Ct Aspiration  Result Date: 07/06/2019 INDICATION: 48 year old with multiple sites of infection including a lumbar paraspinal muscular abscess. Plan for CT-guided aspiration of the paraspinal abscess collection. EXAM: CT-GUIDED ASPIRATION OF PARASPINAL ABSCESS MEDICATIONS: Moderate sedation ANESTHESIA/SEDATION: Fentanyl 100 mcg IV; Versed 2.0 mg IV Moderate Sedation Time:  10 minutes The patient was continuously monitored during the procedure by the interventional radiology nurse under my direct supervision. COMPLICATIONS: None immediate. PROCEDURE: Informed written consent was obtained from the patient after a thorough discussion of the procedural risks, benefits and alternatives. All questions were addressed. A  timeout was performed prior to the initiation of the procedure. Patient was placed prone on the CT scanner. CT images through the lower abdomen were obtained. The right paraspinal muscular abscess was identified at the L4-L5 level. Overlying skin was prepped with chlorhexidine. Sterile field was created. Skin was anesthetized with 1% lidocaine. Yueh catheter was directed into the abscess collection with CT guidance. Approximately 15 mL of brown purulent fluid was aspirated. No additional fluid could be aspirated. At this point in the procedure the patient was in severe pain from lying prone and the procedure was stopped. Bandage placed over the puncture site. FINDINGS: Low-density abscess in the right paraspinal musculature at L4-L5. 15 mL of brown purulent fluid was removed from this collection. IMPRESSION: CT-guided aspiration of the right paraspinal abscess at L4-L5. 15 mL of purulent fluid was removed and sent for culture. Electronically Signed   By: Markus Daft M.D.   On: 07/06/2019 19:22   Ct L-spine No Charge  Result Date: 07/06/2019 CLINICAL DATA:  Right-sided back pain and right lower extremity pain for 3 weeks. EXAM: CT LUMBAR SPINE WITHOUT CONTRAST TECHNIQUE: Multidetector CT imaging of the lumbar spine was performed without intravenous contrast administration. Multiplanar CT image reconstructions were also generated. COMPARISON:  None. FINDINGS: Segmentation: 5 lumbar type vertebrae. Alignment: Normal. Vertebrae: No acute fracture or focal pathologic process. Paraspinal and other soft tissues: There is gas in the paraspinal soft tissues with a 5.6 cm fluid collection in the right multifidus muscle at the L4-5 level. There is gas in the right iliacus and psoas muscles and right gluteus minimus and medius muscles with expansion of those muscles with low-density consistent with abscess. There is also fluid and abnormal soft tissue in the presacral space extending through the right sciatic notch. There is  a tiny bubble of air in the right hip joint. Cholelithiasis. 5 mm stone in the mid right kidney. Small right inguinal hernia containing only fat. Disc levels: T7-8 through L3-4: No appreciable disc protrusions. The discs at T9-10 and T11-12 are degenerated with vacuum phenomena. L4-5: Small broad-based disc bulge. Slight hypertrophy of the left ligamentum flavum creates narrowing of the spinal canal. The CT thecal sac appears compressed in the transverse dimension. L5-S1: Disc space narrowing asymmetric disc osteophyte complex to the left of midline. There appears to be abnormal soft tissue in the spinal canal at S1. Given the adjacent soft tissue infection, the possibility of infection in the spinal canal should be considered. MRI of the lower abdomen and pelvis and lumbar spine recommended for further evaluation of the findings to determine whether there is epidural extension of infection. IMPRESSION: 1. Extensive soft tissue infection in bulging the right paraspinal muscles, right gluteal muscles, presacral space, and the soft tissues of the  right sciatic notch. There is a definable abscess in the right multifidus muscle. 2. Subtle increased soft tissue in the spinal canal at S1. Asymmetric compression of the thecal sac at L4-5. The possibility of infection within the spinal canal should be considered. 3. MRI of the lumbar spine with and without contrast and MRI of the lower abdomen and pelvis with and without contrast recommended for further evaluation. Critical Value/emergent results were called by telephone at the time of interpretation on 07/06/2019 at 8:17 am to the attending ER physician , who verbally acknowledged these results. Electronically Signed   By: Lorriane Shire M.D.   On: 07/06/2019 08:16   Dg Chest Portable 1 View  Result Date: 07/06/2019 CLINICAL DATA:  Shortness of breath. EXAM: PORTABLE CHEST 1 VIEW COMPARISON:  None. FINDINGS: Mild cardiomegaly. Normal mediastinal contours. No focal  airspace disease, pleural effusion, or pneumothorax. No pulmonary edema. No acute osseous abnormalities. IMPRESSION: Mild cardiomegaly. No acute chest findings. Electronically Signed   By: Keith Rake M.D.   On: 07/06/2019 04:10    Pending Labs Unresulted Labs (From admission, onward)    Start     Ordered   07/07/19 [id number]  Basic metabolic panel  Tomorrow morning,   R     07/06/19 1724   07/07/19 0500  CBC  Tomorrow morning,   R     07/06/19 1724   07/06/19 0000000  Basic metabolic panel  Once-Timed,   STAT     07/06/19 1819   07/06/19 1957  Lactic acid, plasma  STAT Now then every 3 hours,   R     07/06/19 1957   07/06/19 1848  Aerobic/Anaerobic Culture (surgical/deep wound)  Once,   STAT    Comments: Paraspinal abscess    07/06/19 1847   07/06/19 1718  HIV antibody (Routine Testing)  Once,   STAT     07/06/19 1724   07/06/19 1642  Anaerobic culture  Once,   STAT     07/06/19 1641   07/06/19 1642  Culture, fungus without smear  Once,   STAT     07/06/19 1641   07/06/19 1641  Anaerobic culture  Once,   STAT     07/06/19 1641   07/06/19 1515  Sedimentation rate  ONCE - STAT,   STAT     07/06/19 1514   07/06/19 1514  C-reactive protein  Once,   STAT     07/06/19 1514   07/06/19 0532  Urine culture  ONCE - STAT,   STAT     07/06/19 0532   07/06/19 0430  Blood Culture ID Panel (Reflexed)  Once,   STAT     07/06/19 0430          Vitals/Pain Today's Vitals   07/06/19 1556 07/06/19 1830 07/06/19 1835 07/06/19 1841  BP: (!) 166/86 (!) 134/93 (!) 148/99 (!) 146/96  Pulse: (!) 123 (!) 125 (!) 123 (!) 126  Resp:  13 12 18   Temp:      TempSrc:      SpO2: 98% 96% 96% 95%  Weight:      Height:      PainSc:        Isolation Precautions No active isolations  Medications Medications  HYDROmorphone (DILAUDID) injection 1 mg (has no administration in time range)  ceFEPIme (MAXIPIME) 2 g in sodium chloride 0.9 % 100 mL IVPB (has no administration in time range)  insulin  regular, human (MYXREDLIN) 100 units/ 100 mL infusion (8.2 Units/hr Intravenous Rate/Dose  Change 07/06/19 2027)  dextrose 5 %-0.45 % sodium chloride infusion (has no administration in time range)  vancomycin (VANCOCIN) 1,500 mg in sodium chloride 0.9 % 500 mL IVPB (has no administration in time range)  0.9 %  sodium chloride infusion (has no administration in time range)  hydrALAZINE (APRESOLINE) tablet 50 mg (has no administration in time range)  enoxaparin (LOVENOX) injection 40 mg (has no administration in time range)  sodium chloride flush (NS) 0.9 % injection 3 mL (has no administration in time range)  acetaminophen (TYLENOL) tablet 650 mg (has no administration in time range)    Or  acetaminophen (TYLENOL) suppository 650 mg (has no administration in time range)  ondansetron (ZOFRAN) tablet 4 mg (has no administration in time range)    Or  ondansetron (ZOFRAN) injection 4 mg (has no administration in time range)  albuterol (PROVENTIL) (2.5 MG/3ML) 0.083% nebulizer solution 2.5 mg (has no administration in time range)  midazolam (VERSED) 2 MG/2ML injection (has no administration in time range)  fentaNYL (SUBLIMAZE) 100 MCG/2ML injection (has no administration in time range)  clindamycin (CLEOCIN) IVPB 600 mg (has no administration in time range)  HYDROmorphone (DILAUDID) injection 1 mg (1 mg Intravenous Given 07/06/19 0405)  ondansetron (ZOFRAN) injection 4 mg (4 mg Intravenous Given 07/06/19 0405)  ceFEPIme (MAXIPIME) 2 g in sodium chloride 0.9 % 100 mL IVPB (0 g Intravenous Stopped 07/06/19 1335)  vancomycin (VANCOCIN) 2,500 mg in sodium chloride 0.9 % 500 mL IVPB (0 mg Intravenous Stopped 07/06/19 1335)  acetaminophen (TYLENOL) tablet 1,000 mg (1,000 mg Oral Given 07/06/19 0710)  sodium chloride 0.9 % bolus 1,000 mL (0 mLs Intravenous Stopped 07/06/19 1336)    And  sodium chloride 0.9 % bolus 1,000 mL (0 mLs Intravenous Stopped 07/06/19 1336)    And  sodium chloride 0.9 % bolus 800 mL (0 mLs  Intravenous Stopped 07/06/19 1644)  iopamidol (ISOVUE-300) 61 % injection 100 mL (100 mLs Intravenous Contrast Given 07/06/19 0729)  LORazepam (ATIVAN) injection 1 mg (1 mg Intravenous Given 07/06/19 1047)  HYDROmorphone (DILAUDID) injection 2 mg (2 mg Intravenous Given 07/06/19 1046)  gadobutrol (GADAVIST) 1 MMOL/ML injection 10 mL (10 mLs Intravenous Contrast Given 07/06/19 1230)  midazolam (VERSED) injection (0.5 mg Intravenous Given 07/06/19 1838)  fentaNYL (SUBLIMAZE) injection (25 mcg Intravenous Given 07/06/19 1838)  0.9 %  sodium chloride infusion (10 mL/hr Intravenous New Bag/Given 07/06/19 1831)    Mobility walks     Focused Assessments Neuro Assessment Handoff:  Swallow screen pass? Yes  Cardiac Rhythm: Sinus tachycardia       Neuro Assessment:   Neuro Checks:      Last Documented NIHSS Modified Score:   Has TPA been given? No If patient is a Neuro Trauma and patient is going to OR before floor call report to [facility] nurse: [phone] or [phone]     R Recommendations: See Admitting Provider Note  Report given to:   Additional Notes:

## 2019-07-07 ENCOUNTER — Inpatient Hospital Stay (HOSPITAL_COMMUNITY): Payer: Self-pay

## 2019-07-07 DIAGNOSIS — R7881 Bacteremia: Secondary | ICD-10-CM

## 2019-07-07 DIAGNOSIS — M7989 Other specified soft tissue disorders: Secondary | ICD-10-CM

## 2019-07-07 DIAGNOSIS — Z885 Allergy status to narcotic agent status: Secondary | ICD-10-CM

## 2019-07-07 DIAGNOSIS — E1110 Type 2 diabetes mellitus with ketoacidosis without coma: Secondary | ICD-10-CM

## 2019-07-07 DIAGNOSIS — G061 Intraspinal abscess and granuloma: Secondary | ICD-10-CM

## 2019-07-07 DIAGNOSIS — E119 Type 2 diabetes mellitus without complications: Secondary | ICD-10-CM

## 2019-07-07 DIAGNOSIS — K6812 Psoas muscle abscess: Secondary | ICD-10-CM

## 2019-07-07 DIAGNOSIS — I361 Nonrheumatic tricuspid (valve) insufficiency: Secondary | ICD-10-CM

## 2019-07-07 DIAGNOSIS — I10 Essential (primary) hypertension: Secondary | ICD-10-CM

## 2019-07-07 DIAGNOSIS — R52 Pain, unspecified: Secondary | ICD-10-CM

## 2019-07-07 DIAGNOSIS — E669 Obesity, unspecified: Secondary | ICD-10-CM

## 2019-07-07 DIAGNOSIS — Z880 Allergy status to penicillin: Secondary | ICD-10-CM

## 2019-07-07 DIAGNOSIS — B9561 Methicillin susceptible Staphylococcus aureus infection as the cause of diseases classified elsewhere: Secondary | ICD-10-CM

## 2019-07-07 LAB — GLUCOSE, CAPILLARY
Glucose-Capillary: 202 mg/dL — ABNORMAL HIGH (ref 70–99)
Glucose-Capillary: 159 mg/dL — ABNORMAL HIGH (ref 70–99)
Glucose-Capillary: 123 mg/dL — ABNORMAL HIGH (ref 70–99)
Glucose-Capillary: 105 mg/dL — ABNORMAL HIGH (ref 70–99)
Glucose-Capillary: 112 mg/dL — ABNORMAL HIGH (ref 70–99)
Glucose-Capillary: 135 mg/dL — ABNORMAL HIGH (ref 70–99)
Glucose-Capillary: 141 mg/dL — ABNORMAL HIGH (ref 70–99)
Glucose-Capillary: 153 mg/dL — ABNORMAL HIGH (ref 70–99)
Glucose-Capillary: 133 mg/dL — ABNORMAL HIGH (ref 70–99)
Glucose-Capillary: 143 mg/dL — ABNORMAL HIGH (ref 70–99)
Glucose-Capillary: 170 mg/dL — ABNORMAL HIGH (ref 70–99)
Glucose-Capillary: 207 mg/dL — ABNORMAL HIGH (ref 70–99)
Glucose-Capillary: 238 mg/dL — ABNORMAL HIGH (ref 70–99)

## 2019-07-07 LAB — URINE CULTURE: Culture: >=100000 — AB

## 2019-07-07 LAB — HIV ANTIBODY (ROUTINE TESTING W REFLEX): HIV Screen 4th Generation wRfx: NONREACTIVE

## 2019-07-07 LAB — BASIC METABOLIC PANEL
Anion gap: 11 (ref 5–15)
BUN: 11 mg/dL (ref 6–20)
CO2: 24 mmol/L (ref 22–32)
Calcium: 8.2 mg/dL — ABNORMAL LOW (ref 8.9–10.3)
Chloride: 92 mmol/L — ABNORMAL LOW (ref 98–111)
Creatinine, Ser: 0.64 mg/dL (ref 0.61–1.24)
GFR calc Af Amer: >60 mL/min (ref >60)
GFR calc non Af Amer: >60 mL/min (ref >60)
Glucose, Bld: 130 mg/dL — ABNORMAL HIGH (ref 70–99)
Potassium: 4.1 mmol/L (ref 3.5–5.1)
Sodium: 127 mmol/L — ABNORMAL LOW (ref 135–145)

## 2019-07-07 LAB — ECHOCARDIOGRAM COMPLETE
Height: 78 in
Weight: 5784.87 oz

## 2019-07-07 LAB — CBC
HCT: 34.2 % — ABNORMAL LOW (ref 39.0–52.0)
Hemoglobin: 11.1 g/dL — ABNORMAL LOW (ref 13.0–17.0)
MCH: 30 pg (ref 26.0–34.0)
MCHC: 32.5 g/dL (ref 30.0–36.0)
MCV: 92.4 fL (ref 80.0–100.0)
Platelets: 197 10*3/uL (ref 150–400)
RBC: 3.7 MIL/uL — ABNORMAL LOW (ref 4.22–5.81)
RDW: 12.6 % (ref 11.5–15.5)
WBC: 6.7 10*3/uL (ref 4.0–10.5)
nRBC: 0 % (ref 0.0–0.2)

## 2019-07-07 LAB — SEDIMENTATION RATE: Sed Rate: 120 mm/hr — ABNORMAL HIGH (ref 0–16)

## 2019-07-07 LAB — HEMOGLOBIN A1C
Hgb A1c MFr Bld: 12.6 % — ABNORMAL HIGH (ref 4.8–5.6)
Mean Plasma Glucose: 314.92 mg/dL

## 2019-07-07 MED ORDER — HYDRALAZINE HCL 20 MG/ML IJ SOLN: 10.0000 mg | Freq: Four times a day (QID) | INTRAMUSCULAR | Status: DC | PRN

## 2019-07-07 MED ORDER — CEFAZOLIN SODIUM-DEXTROSE 2-4 GM/100ML-% IV SOLN
2.0000 g | Freq: Three times a day (TID) | INTRAVENOUS | Status: DC
  Administered 2019-07-07: 2 g via INTRAVENOUS
  Filled 2019-07-07 (×3): qty 100

## 2019-07-07 MED ORDER — ENOXAPARIN SODIUM 80 MG/0.8ML ~~LOC~~ SOLN
0.5000 mg/kg | SUBCUTANEOUS | Status: DC
  Filled 2019-07-07: qty 0.8

## 2019-07-07 MED ORDER — ENOXAPARIN SODIUM 80 MG/0.8ML ~~LOC~~ SOLN
0.5000 mg/kg | SUBCUTANEOUS | Status: DC
  Administered 2019-07-08 – 2019-08-01 (×24): 80 mg via SUBCUTANEOUS
  Filled 2019-07-07 (×28): qty 0.8

## 2019-07-07 MED ORDER — GABAPENTIN 300 MG PO CAPS
300.0000 mg | ORAL_CAPSULE | Freq: Two times a day (BID) | ORAL | Status: DC
  Administered 2019-07-07 – 2019-07-09 (×5): 300 mg via ORAL
  Filled 2019-07-07 (×5): qty 1

## 2019-07-07 MED ORDER — FENTANYL CITRATE (PF) 100 MCG/2ML IJ SOLN
50.0000 ug | INTRAMUSCULAR | Status: DC | PRN
  Administered 2019-07-07: 75 ug via INTRAVENOUS
  Administered 2019-07-07: 50 ug via INTRAVENOUS
  Administered 2019-07-07 – 2019-07-08 (×2): 75 ug via INTRAVENOUS
  Filled 2019-07-07 (×4): qty 2

## 2019-07-07 MED ORDER — CEFAZOLIN SODIUM-DEXTROSE 2-4 GM/100ML-% IV SOLN
2.0000 g | Freq: Three times a day (TID) | INTRAVENOUS | Status: DC
  Administered 2019-07-07 – 2019-07-19 (×33): 2 g via INTRAVENOUS
  Administered 2019-07-20: 3 g via INTRAVENOUS
  Administered 2019-07-20 – 2019-08-16 (×82): 2 g via INTRAVENOUS
  Filled 2019-07-07 (×129): qty 100

## 2019-07-07 MED ORDER — ENOXAPARIN SODIUM 40 MG/0.4ML ~~LOC~~ SOLN
40.0000 mg | Freq: Once | SUBCUTANEOUS | Status: AC
  Administered 2019-07-07: 40 mg via SUBCUTANEOUS
  Filled 2019-07-07: qty 0.4

## 2019-07-07 MED ORDER — INSULIN NPH (HUMAN) (ISOPHANE) 100 UNIT/ML ~~LOC~~ SUSP
12.0000 [IU] | Freq: Two times a day (BID) | SUBCUTANEOUS | Status: DC
  Administered 2019-07-07 – 2019-07-08 (×3): 12 [IU] via SUBCUTANEOUS
  Filled 2019-07-07: qty 10

## 2019-07-07 MED ORDER — FENTANYL CITRATE (PF) 100 MCG/2ML IJ SOLN
25.0000 ug | INTRAMUSCULAR | Status: DC | PRN
  Filled 2019-07-07: qty 2

## 2019-07-07 MED ORDER — OXYCODONE HCL 5 MG PO TABS
5.0000 mg | ORAL_TABLET | Freq: Four times a day (QID) | ORAL | Status: DC | PRN
  Administered 2019-07-07 – 2019-07-09 (×6): 10 mg via ORAL
  Administered 2019-07-09: 5 mg via ORAL
  Administered 2019-07-09 – 2019-07-10 (×2): 10 mg via ORAL
  Filled 2019-07-07 (×10): qty 2

## 2019-07-07 MED ORDER — HYDRALAZINE HCL 25 MG PO TABS
25.0000 mg | ORAL_TABLET | Freq: Two times a day (BID) | ORAL | Status: DC
  Administered 2019-07-07 – 2019-08-16 (×74): 25 mg via ORAL
  Filled 2019-07-07 (×76): qty 1

## 2019-07-07 MED ORDER — INSULIN ASPART 100 UNIT/ML ~~LOC~~ SOLN
0.0000 [IU] | Freq: Three times a day (TID) | SUBCUTANEOUS | Status: DC
  Administered 2019-07-07: 3 [IU] via SUBCUTANEOUS
  Administered 2019-07-07: 5 [IU] via SUBCUTANEOUS
  Administered 2019-07-08: 11 [IU] via SUBCUTANEOUS
  Administered 2019-07-08: 8 [IU] via SUBCUTANEOUS
  Administered 2019-07-08: 11 [IU] via SUBCUTANEOUS
  Administered 2019-07-09: 5 [IU] via SUBCUTANEOUS
  Administered 2019-07-09: 8 [IU] via SUBCUTANEOUS
  Administered 2019-07-10: 11 [IU] via SUBCUTANEOUS
  Administered 2019-07-10: 8 [IU] via SUBCUTANEOUS
  Administered 2019-07-10: 11 [IU] via SUBCUTANEOUS
  Administered 2019-07-11: 5 [IU] via SUBCUTANEOUS
  Administered 2019-07-11: 2 [IU] via SUBCUTANEOUS
  Administered 2019-07-11: 09:00:00 5 [IU] via SUBCUTANEOUS
  Administered 2019-07-12 (×3): 3 [IU] via SUBCUTANEOUS
  Administered 2019-07-13: 17:00:00 2 [IU] via SUBCUTANEOUS
  Administered 2019-07-13: 5 [IU] via SUBCUTANEOUS
  Administered 2019-07-13: 09:00:00 2 [IU] via SUBCUTANEOUS
  Administered 2019-07-14: 6 [IU] via SUBCUTANEOUS
  Administered 2019-07-14: 5 [IU] via SUBCUTANEOUS
  Administered 2019-07-14: 17:00:00 2 [IU] via SUBCUTANEOUS
  Filled 2019-07-07: qty 1

## 2019-07-07 NOTE — Progress Notes (Signed)
PROGRESS NOTE    Bradley Cox  [id number]  DOB: [birth year]  DOA: 07/05/2019 PCP: Simona Huh, NP  Brief Narrative:  48 year old male with history of obesity, asthma, hypertension, GERD, nephrolithiasis, recently diagnosed diabetes mellitus presents with complaints of unrelenting low back pain radiating to the right gluteus/thigh associated with progressive decline in right lower extremity strength/ambulation over the last 2-3 weeks. Patient's clinical course started when he presented to [facility] urgent care on August 15 with complaints of gross hematuria.  Patient states he did not have any pain at that time but underwent CT imaging which showed right ureteral inflammation as well as additional nephrolithiasis (2 stones) and was given right gluteus IM Toradol apparently in anticipation of him passing the additional stones.  2 days later on August 17, patient presented to [facility] ED with complaints of right flank pain radiating to right lower extremity.  He underwent repeat CT imaging which showed 2 intrarenal calculi in the mid and lower pole regions of the RIGHT kidney, largest measuring 0.6 centimeters as well as mild stranding along the right ureter without any ureteric stones visualized.  During this visit he was also noted to have blood glucose of 455 which improved to 345 with IV hydration.  In this visit CBC was unremarkable, although patient had an isolated temp of 100.1.  He was discharged home with prescription for ciprofloxacin 500 mg every 12 and metformin 500 mg twice daily. On August 24, patient again presented to [facility] with complaints of  worsening low back pain radiating to right lower extremity and was prescribed prednisone for sciatica.  Patient presented yesterday with significant deterioration in terms of pain/right lower extremity strength limiting his ambulation.  He was noted to have temp of 102.8, tachycardic and tachypneic.  WBC 10.4,  sodium 127, blood glucose of 524 with bicarb of 21 and anion gap of 18.CT scan revealed a localized abscess involving the right iliac fossa and muscular at the right buttocks and paraspinal muscle.  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  Sepsis protocol was initiated and patient admitted to medical service with insulin drip for DKA and broad-spectrum antibiotics including IV vancomycin, cefepime and clindamycin.  Patient did undergo IR paraspinal abscess drainage and neurosurgery has been following.  Blood cultures sent overnight now reporting to be growing MSSA.   Subjective:  Patient in excruciating pain, 10 out of 10, and also reports pain/tightness in the right calf.  Nurse reports expiration of IV Dilaudid ordered for pain control overnight.  Patient denies any history of IV drug abuse.  Denies any recurrence of gross hematuria in the last 2 weeks.  Objective: Vitals:   07/06/19 2116 07/06/19 2350 07/07/19 0345 07/07/19 0735  BP: 136/76 117/66 116/67 113/69  Pulse: (!) 144  (!) 114 (!) 103  Resp: 19 (!) 25 13 (!) 25  Temp: 98.5 F (36.9 C) 98.6 F (37 C) 99.3 F (37.4 C) 100.2 F (37.9 C)  TempSrc: Oral Oral Oral Oral  SpO2: (!) 85% 95% 96% 97%  Weight: (!) 164 kg     Height: 6\' 6"  (1.981 m)       Intake/Output Summary (Last 24 hours) at 07/07/2019 0923 Last data filed at 07/07/2019 0700 Gross per 24 hour  Intake 1810 ml  Output 1050 ml  Net 760 ml   [employer]  07/05/19 1807 07/06/19 2116  Weight: (!) 167.4 kg (!) 164 kg    Physical Examination:  General exam: Appears to be in severe distress due to pain Respiratory system: Clear to auscultation. Respiratory effort normal. Cardiovascular system: S1 & S2 heard, RRR. No JVD, murmurs, rubs, gallops or clicks. No pedal edema. Gastrointestinal system: Abdomen is obese, soft and nontender.  No organomegaly or masses felt. Normal bowel sounds heard. Central nervous system: Alert and oriented. No focal neurological deficits except for decreased range of motion/strength along the right lower extremity in the setting of infection/pain Extremities: Right calf swelling noted, tight, erythematous and tender Skin: No rashes, lesions or ulcers Psychiatry: Judgement and insight appear normal. Mood & affect appropriate.     Data Reviewed: I have personally reviewed following labs and imaging studies  CBC: Recent Labs  Lab 07/06/19 0359 07/06/19 1738 07/07/19 0521  WBC 10.4  --  6.7  NEUTROABS 8.6*  --   --   HGB 14.0 12.6* 11.1*  HCT 42.6 37.0* 34.2*  MCV 94.2  --  92.4  PLT 256  --  [id number]   Basic Metabolic Panel: Recent Labs  Lab 07/06/19 0526 07/06/19 1738 07/06/19 2213 07/07/19 0521  NA 127* 132* 132* 127*  K 6.1* 4.6 4.8 4.1  CL 88* 96* 95* 92*  CO2 21*  --  26 24  GLUCOSE 524* 338* 107* 130*  BUN 21* 17 14 11   CREATININE 1.36* 0.60* 0.75 0.64  CALCIUM 9.2  --  8.4* 8.2*   GFR: Estimated Creatinine Clearance: 190.2 mL/min (by C-G formula based on SCr of 0.64 mg/dL). Liver Function Tests: Recent Labs  Lab 07/06/19 0526  AST 25  ALT 19  ALKPHOS 84  BILITOT 1.0  PROT 7.7  ALBUMIN 1.9*   No results for input(s): LIPASE, AMYLASE in the last 168 hours. No results for input(s): AMMONIA in the last 168 hours. Coagulation Profile: No results for input(s): INR, PROTIME in the last 168 hours. Cardiac Enzymes: No results for input(s): CKTOTAL, CKMB, CKMBINDEX, TROPONINI in the last 168 hours. BNP (last 3 results) No results for input(s): PROBNP in the last 8760 hours. HbA1C: No results for input(s): HGBA1C in the last 72 hours. CBG: Recent Labs  Lab 07/07/19 0401 07/07/19 0432 07/07/19 0530 07/07/19 0644 07/07/19 0733  GLUCAP 105* 112* 135* 141* 153*   Lipid Profile: No results for input(s): CHOL, HDL, LDLCALC, TRIG, CHOLHDL, LDLDIRECT in the last 72  hours. Thyroid Function Tests: No results for input(s): TSH, T4TOTAL, FREET4, T3FREE, THYROIDAB in the last 72 hours. Anemia Panel: No results for input(s): VITAMINB12, FOLATE, FERRITIN, TIBC, IRON, RETICCTPCT in the last 72 hours. Sepsis Labs: Recent Labs  Lab 07/06/19 0359 07/06/19 1445 07/06/19 2213  LATICACIDVEN 3.0* 2.2* 2.6*    Recent Results (from the past 240 hour(s))  Blood culture (routine x 2)     Status: None (Preliminary result)   Collection Time: 07/06/19  4:30 AM   Specimen: BLOOD  Result Value Ref Range Status   Specimen Description BLOOD SITE NOT SPECIFIED  Final   Special Requests   Final    BOTTLES DRAWN AEROBIC AND ANAEROBIC Blood Culture results may not be optimal due to an inadequate volume of blood received in culture bottles   Culture  Setup Time   Final    IN BOTH AEROBIC AND ANAEROBIC BOTTLES GRAM POSITIVE COCCI Organism ID to follow CRITICAL RESULT CALLED TO, READ BACK BY AND VERIFIED WITH: Dion Body [facility] 07/06/19 2058 JDW Performed at  [facility], [place] [address] [place], [place] [address]    Culture GRAM POSITIVE COCCI  Final   Report Status PENDING  Incomplete  Blood culture (routine x 2)     Status: None (Preliminary result)   Collection Time: 07/06/19  4:30 AM   Specimen: BLOOD  Result Value Ref Range Status   Specimen Description BLOOD SITE NOT SPECIFIED  Final   Special Requests   Final    BOTTLES DRAWN AEROBIC AND ANAEROBIC Blood Culture adequate volume   Culture  Setup Time   Final    IN BOTH AEROBIC AND ANAEROBIC BOTTLES GRAM POSITIVE COCCI CRITICAL VALUE NOTED.  VALUE IS CONSISTENT WITH PREVIOUSLY REPORTED AND CALLED VALUE. Performed at [facility], [place] [address] [place], [place] [address]    Culture GRAM POSITIVE COCCI  Final   Report Status PENDING  Incomplete  Blood Culture ID Panel (Reflexed)     Status: Abnormal   Collection Time: 07/06/19  4:30 AM  Result Value Ref Range Status   Enterococcus species NOT  DETECTED NOT DETECTED Final   Listeria monocytogenes NOT DETECTED NOT DETECTED Final   Staphylococcus species DETECTED (A) NOT DETECTED Final    Comment: CRITICAL RESULT CALLED TO, READ BACK BY AND VERIFIED WITH: J MILLEN PHARMD 07/06/19 2058 JDW    Staphylococcus aureus (BCID) DETECTED (A) NOT DETECTED Final    Comment: Methicillin (oxacillin) susceptible Staphylococcus aureus (MSSA). Preferred therapy is anti staphylococcal beta lactam antibiotic (Cefazolin or Nafcillin), unless clinically contraindicated. CRITICAL RESULT CALLED TO, READ BACK BY AND VERIFIED WITH: J MILLEN PHARMD 07/06/19 2058 JDW    Methicillin resistance NOT DETECTED NOT DETECTED Final   Streptococcus species NOT DETECTED NOT DETECTED Final   Streptococcus agalactiae NOT DETECTED NOT DETECTED Final   Streptococcus pneumoniae NOT DETECTED NOT DETECTED Final   Streptococcus pyogenes NOT DETECTED NOT DETECTED Final   Acinetobacter baumannii NOT DETECTED NOT DETECTED Final   Enterobacteriaceae species NOT DETECTED NOT DETECTED Final   Enterobacter cloacae complex NOT DETECTED NOT DETECTED Final   Escherichia coli NOT DETECTED NOT DETECTED Final   Klebsiella oxytoca NOT DETECTED NOT DETECTED Final   Klebsiella pneumoniae NOT DETECTED NOT DETECTED Final   Proteus species NOT DETECTED NOT DETECTED Final   Serratia marcescens NOT DETECTED NOT DETECTED Final   Haemophilus influenzae NOT DETECTED NOT DETECTED Final   Neisseria meningitidis NOT DETECTED NOT DETECTED Final   Pseudomonas aeruginosa NOT DETECTED NOT DETECTED Final   Candida albicans NOT DETECTED NOT DETECTED Final   Candida glabrata NOT DETECTED NOT DETECTED Final   Candida krusei NOT DETECTED NOT DETECTED Final   Candida parapsilosis NOT DETECTED NOT DETECTED Final   Candida tropicalis NOT DETECTED NOT DETECTED Final    Comment: Performed at [facility], [place] [address], [place], [place] [address]  Urine culture     Status: None (Preliminary result)    Collection Time: 07/06/19  4:37 AM   Specimen: Urine, Random  Result Value Ref Range Status   Specimen Description URINE, RANDOM  Final   Special Requests NONE  Final   Culture   Final    CULTURE REINCUBATED FOR BETTER GROWTH Performed at [facility], [place] [address] [place], [place] [address]    Report Status PENDING  Incomplete  SARS Coronavirus 2 [facility] order, Performed in [facility] hospital lab) Nasopharyngeal Nasopharyngeal Swab     Status: None   Collection Time: 07/06/19  8:04 AM   Specimen: Nasopharyngeal Swab  Result Value Ref Range Status   SARS Coronavirus 2 NEGATIVE NEGATIVE Final    Comment: (NOTE) If result is NEGATIVE SARS-CoV-2 target nucleic acids are NOT DETECTED. The SARS-CoV-2 RNA is generally detectable in upper and lower  respiratory specimens during the acute phase of infection. The lowest  concentration of SARS-CoV-2 viral copies this assay can detect is 250  copies / mL. A negative result does not preclude SARS-CoV-2 infection  and should not be used as the sole basis for treatment or other  patient management decisions.  A negative result may occur with  improper specimen collection / handling, submission of specimen other  than nasopharyngeal swab, presence of viral mutation(s) within the  areas targeted by this assay, and inadequate number of viral copies  (<250 copies / mL). A negative result must be combined with clinical  observations, patient history, and epidemiological information. If result is POSITIVE SARS-CoV-2 target nucleic acids are DETECTED. The SARS-CoV-2 RNA is generally detectable in upper and lower  respiratory specimens dur ing the acute phase of infection.  Positive  results are indicative of active infection with SARS-CoV-2.  Clinical  correlation with patient history and other diagnostic information is  necessary to determine patient infection status.  Positive results do  not rule out bacterial infection or  co-infection with other viruses. If result is PRESUMPTIVE POSTIVE SARS-CoV-2 nucleic acids MAY BE PRESENT.   A presumptive positive result was obtained on the submitted specimen  and confirmed on repeat testing.  While 2019 novel coronavirus  (SARS-CoV-2) nucleic acids may be present in the submitted sample  additional confirmatory testing may be necessary for epidemiological  and / or clinical management purposes  to differentiate between  SARS-CoV-2 and other Sarbecovirus currently known to infect humans.  If clinically indicated additional testing with an alternate test  methodology [phone]) is advised. The SARS-CoV-2 RNA is generally  detectable in upper and lower respiratory sp ecimens during the acute  phase of infection. The expected result is Negative. Fact Sheet for Patients:  StrictlyIdeas.no Fact Sheet for Healthcare Providers: BankingDealers.co.za This test is not yet approved or cleared by the [place] FDA and has been authorized for detection and/or diagnosis of SARS-CoV-2 by FDA under an Emergency Use Authorization (EUA).  This EUA will remain in effect (meaning this test can be used) for the duration of the COVID-19 declaration under Section 564(b)(1) of the Act, 21 U.S.C. section 360bbb-3(b)(1), unless the authorization is terminated or revoked sooner. Performed at [facility], [place] [address], [place], [place] [address]   Aerobic/Anaerobic Culture (surgical/deep wound)     Status: None (Preliminary result)   Collection Time: 07/06/19  6:48 PM   Specimen: Abscess  Result Value Ref Range Status   Specimen Description ABSCESS  Final   Special Requests PARASPINAL  Final   Gram Stain   Final    FEW WBC PRESENT, PREDOMINANTLY PMN ABUNDANT GRAM POSITIVE COCCI Performed at [facility], [place] [address], [place], [place] [address]    Culture PENDING  Incomplete   Report Status PENDING  Incomplete       Radiology Studies: Mr Lumbar Spine W Wo Contrast  Result Date: 07/06/2019 CLINICAL DATA:  Infection.  Rule out epidural abscess. EXAM: MRI LUMBAR SPINE WITHOUT AND WITH CONTRAST TECHNIQUE: Multiplanar and multiecho pulse sequences of the lumbar spine were obtained without and with intravenous contrast. CONTRAST:  10 mL Gadovist IV COMPARISON:  CT abdomen pelvis in CT lumbar spine 07/06/2019 FINDINGS: Segmentation:  Normal Alignment:  Mild retrolisthesis L1-2 and L4-5 and L5-S1. Vertebrae: Negative for vertebral fracture or mass. No vertebral edema or abnormal enhancement. No evidence of discitis. Conus medullaris and cauda equina: Conus extends to the L1-2 level. Conus and cauda equina appear normal. Paraspinal and other soft tissues: Large enhancing fluid collection in the right paraspinous muscle at the L4 level measures 5.5 x 6 cm compatible with abscess. Adjacent facet joints show mild degeneration but no definite infection. Multilocular large fluid collection in the right iliopsoas muscle compatible with abscess. This measures at least 5 x 6 cm. Large multilocular fluid collection in the right gluteus muscle measures at least 5 cm. Abscess in the iliopsoas and gluteus muscle appear to communicate through the sciatic notch. Disc levels: L1-2: Disc and facet degeneration. Shallow left-sided disc protrusion with mild subarticular stenosis on the left. L2-3: Negative L3-4: Normal disc space. Small epidural abscess posteriorly on the left. Bilateral facet degeneration without evidence of facet joint infection. L4-5: Mild retrolisthesis. Disc degeneration and facet degeneration. Small extraforaminal disc protrusion on the left. Epidural abscess posteriorly on the left compressing the thecal sac and contributing to moderate spinal stenosis. No evidence of facet joint infection. L5-S1: Disc degeneration with left-sided disc protrusion. Posterior epidural abscess is ill-defined but is contributing to compression  of thecal sac. There is enhancement and fluid along the right S1 nerve root which may be the route of spread to the epidural space. No evidence of facet infection. IMPRESSION: 1. Multiple areas of infection. Large areas of abscess formation in the right paraspinous muscle at the L4 level. Large abscess in the right iliopsoas muscle and also in the right gluteus muscle. 2. 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 This may have entered the spinal canal via the right S1 nerve root. No evidence of discitis or facet joint infection. Electronically Signed   By: Franchot Gallo M.D.   On: 07/06/2019 13:34   Mr Pelvis W Wo Contrast  Result Date: 07/06/2019 CLINICAL DATA:  Right-sided low back pain for several weeks. No recent injury. Recent antibiotic therapy for urinary tract infection. EXAM: MRI PELVIS WITHOUT AND WITH CONTRAST TECHNIQUE: Multiplanar multisequence MR imaging of the pelvis was performed both before and after administration of intravenous contrast. CONTRAST:  10 cc Gadavist COMPARISON:  Abdominopelvic CT 07/06/2019 and 06/18/2019. FINDINGS: Urinary Tract: The visualized distal ureters and bladder appear unremarkable. Bowel: No bowel wall thickening, distention or surrounding inflammation identified within the pelvis. Vascular/Lymphatic: No enlarged pelvic lymph nodes identified. No significant vascular findings. Reproductive: There are areas T2 hyperintensity and decreased enhancement centrally in the prostate gland bilaterally which may be secondary to benign prosthetic hypertrophy or prostatitis. There is no surrounding inflammatory change. The seminal vesicles appear normal. Other: No ascites. Musculoskeletal: As demonstrated on earlier CT scan, there are multiple complex fluid collections surrounding the right sacroiliac joint. These demonstrate internal air bubbles, heterogeneity and irregular peripheral enhancement. Dominant component laterally in the gluteus musculature measures up to  9.8 x 6.1 cm on image 18/13. There is a component in the right issue femoral fossa measuring up to 5.2 cm on image 27/3. This demonstrates inferior extension around the right common hamstring tendon. There are intrapelvic components extending through the greater sciatic foramen, involving the right piriformis muscle and measuring up to 6.3 cm on image 13/12. There are also irregular components within the right iliopsoas muscle measuring up to 3.8 cm on image 9/13. There are right posterior paraspinal components within the erector  spinae musculature, measuring up to 6.2 cm on image 1/13. There is mild T2 hyperintensity and enhancement within the right sacroiliac joint, with associated marrow edema and enhancement in the adjacent right sacral ala. T2 hyperintensity and enhancement are also present within the left proximal common hamstring muscle. There are no focal fluid collections in the left pelvis. No significant hip joint effusion or abnormal synovial enhancement. IMPRESSION: 1. As seen on earlier CT, there are multiple complex fluid collections in the right pelvis with internal air and peripheral enhancement consistent with multifocal abscesses. 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 2. Inflammation extends into the erector spinae musculature on the right. Lumbar spine findings are dictated separately. 3. Mild T2 hyperintensity within the proximal left common hamstring muscle. 4. Central heterogeneity within the prostate gland is removed from these other findings and probably due to BPH. Prostatitis could have this appearance. Electronically Signed   By: Richardean Sale M.D.   On: 07/06/2019 13:32   Ct Abdomen Pelvis W Contrast  Addendum Date: 07/06/2019   ADDENDUM REPORT: 07/06/2019 08:20 ADDENDUM: Original report by Dr. Golden Circle. Addendum by Dr. Jeralyn Ruths for  contrast extravasation documentation: Type of contrast:  Isovue 300 Site of extravasation: Right AC Estimated volume of extravasation: 40 ml Area of extravasation scanned with CT? no PATIENT'S SIGNS AND SYMPTOMS Skin blistering/ulceration: no Decrease capillary refill: no Change in skin color: no Decreased motor function or severe tightness: no Decreased pulses distal to site of extravasation: no Altered sensation: no Increasing pain or signs of increased swelling during observation: no TREATMENT An ice pack was applied with instructions given to the patient for limb elevation and warning signs to look for. The patient was returned to the emergency department. Electronically Signed   By: Logan Bores M.D.   On: 07/06/2019 08:20   Result Date: 07/06/2019 CLINICAL DATA:  Right-sided pain for several weeks, initial encounter EXAM: CT ABDOMEN AND PELVIS WITH CONTRAST TECHNIQUE: Multidetector CT imaging of the abdomen and pelvis was performed using the standard protocol following bolus administration of intravenous contrast. CONTRAST:  167mL ISOVUE-300 IOPAMIDOL (ISOVUE-300) INJECTION 61% COMPARISON:  06/18/2019 FINDINGS: Lower chest: Lung bases are well aerated bilaterally. There is a vague nodular density identified on the first image within the right middle lobe. This area was not covered on the prior exam. Hepatobiliary: Liver is diffusely decreased in attenuation consistent with fatty infiltration. The gallbladder is decompressed. Scattered calcifications are noted within consistent with stones. No obstructive changes are noted. Pancreas: Unremarkable. No pancreatic ductal dilatation or surrounding inflammatory changes. Spleen: Normal in size without focal abnormality. Adrenals/Urinary Tract: Adrenal glands are within normal limits. The kidneys are well visualized with renal calculi stable in appearance from the prior exam. The largest of these lies in the lower pole measuring approximately 9 mm. The left kidney  demonstrates no renal calculi. Very mild prominence of the collecting system and ureter on the right are seen similar to that noted on the prior exam without obstructing lesion. The bladder is well distended. Stomach/Bowel: No obstructive or inflammatory changes of the colon are seen. The appendix is not well visualized. No obstructive or inflammatory changes of the small bowel are seen. The stomach is unremarkable. Vascular/Lymphatic: No significant vascular findings are present. No enlarged abdominal or pelvic lymph nodes. Reproductive: Prostate is unremarkable. Other: No abdominal wall hernia or abnormality. No abdominopelvic ascites. Musculoskeletal: There are  changes within the right buttock, right iliac fossa and perispinal musculature on the right with fluid collections and air foci identified consistent with localized infection. The fluid collection within the right paraspinal musculature measures 5.6 x 5.5 cm consistent with a focal abscess. Similar extension into the iliac fossa on the right as well as the musculature of the right buttocks is seen. Soft tissue density in the presacral region and along the right pelvic wall are noted consistent with focal localized infection as well. IMPRESSION: Changes consistent with localized abscess involving the right iliac fossa, musculature of the right buttocks and right paraspinal musculature with extension into the lateral aspect of the pelvis on the right. Percutaneous drainage of the paraspinal collection may be helpful. These changes are all new from the prior exam. Stable renal calculi on the right. Stable cholelithiasis and fatty infiltration of the liver. Electronically Signed: By: Inez Catalina M.D. On: 07/06/2019 07:55   Ct Aspiration  Result Date: 07/06/2019 INDICATION: 48 year old with multiple sites of infection including a lumbar paraspinal muscular abscess. Plan for CT-guided aspiration of the paraspinal abscess collection. EXAM: CT-GUIDED  ASPIRATION OF PARASPINAL ABSCESS MEDICATIONS: Moderate sedation ANESTHESIA/SEDATION: Fentanyl 100 mcg IV; Versed 2.0 mg IV Moderate Sedation Time:  10 minutes The patient was continuously monitored during the procedure by the interventional radiology nurse under my direct supervision. COMPLICATIONS: None immediate. PROCEDURE: Informed written consent was obtained from the patient after a thorough discussion of the procedural risks, benefits and alternatives. All questions were addressed. A timeout was performed prior to the initiation of the procedure. Patient was placed prone on the CT scanner. CT images through the lower abdomen were obtained. The right paraspinal muscular abscess was identified at the L4-L5 level. Overlying skin was prepped with chlorhexidine. Sterile field was created. Skin was anesthetized with 1% lidocaine. Yueh catheter was directed into the abscess collection with CT guidance. Approximately 15 mL of brown purulent fluid was aspirated. No additional fluid could be aspirated. At this point in the procedure the patient was in severe pain from lying prone and the procedure was stopped. Bandage placed over the puncture site. FINDINGS: Low-density abscess in the right paraspinal musculature at L4-L5. 15 mL of brown purulent fluid was removed from this collection. IMPRESSION: CT-guided aspiration of the right paraspinal abscess at L4-L5. 15 mL of purulent fluid was removed and sent for culture. Electronically Signed   By: Markus Daft M.D.   On: 07/06/2019 19:22   Ct L-spine No Charge  Result Date: 07/06/2019 CLINICAL DATA:  Right-sided back pain and right lower extremity pain for 3 weeks. EXAM: CT LUMBAR SPINE WITHOUT CONTRAST TECHNIQUE: Multidetector CT imaging of the lumbar spine was performed without intravenous contrast administration. Multiplanar CT image reconstructions were also generated. COMPARISON:  None. FINDINGS: Segmentation: 5 lumbar type vertebrae. Alignment: Normal. Vertebrae: No  acute fracture or focal pathologic process. Paraspinal and other soft tissues: There is gas in the paraspinal soft tissues with a 5.6 cm fluid collection in the right multifidus muscle at the L4-5 level. There is gas in the right iliacus and psoas muscles and right gluteus minimus and medius muscles with expansion of those muscles with low-density consistent with abscess. There is also fluid and abnormal soft tissue in the presacral space extending through the right sciatic notch. There is a tiny bubble of air in the right hip joint. Cholelithiasis. 5 mm stone in the mid right kidney. Small right inguinal hernia containing only fat. Disc levels: T7-8 through L3-4: No appreciable disc  protrusions. The discs at T9-10 and T11-12 are degenerated with vacuum phenomena. L4-5: Small broad-based disc bulge. Slight hypertrophy of the left ligamentum flavum creates narrowing of the spinal canal. The CT thecal sac appears compressed in the transverse dimension. L5-S1: Disc space narrowing asymmetric disc osteophyte complex to the left of midline. There appears to be abnormal soft tissue in the spinal canal at S1. Given the adjacent soft tissue infection, the possibility of infection in the spinal canal should be considered. MRI of the lower abdomen and pelvis and lumbar spine recommended for further evaluation of the findings to determine whether there is epidural extension of infection. IMPRESSION: 1. Extensive soft tissue infection in bulging the right paraspinal muscles, right gluteal muscles, presacral space, and the soft tissues of the right sciatic notch. There is a definable abscess in the right multifidus muscle. 2. Subtle increased soft tissue in the spinal canal at S1. Asymmetric compression of the thecal sac at L4-5. The possibility of infection within the spinal canal should be considered. 3. MRI of the lumbar spine with and without contrast and MRI of the lower abdomen and pelvis with and without contrast  recommended for further evaluation. Critical Value/emergent results were called by telephone at the time of interpretation on 07/06/2019 at 8:17 am to the attending ER physician , who verbally acknowledged these results. Electronically Signed   By: Lorriane Shire M.D.   On: 07/06/2019 08:16   Dg Chest Portable 1 View  Result Date: 07/06/2019 CLINICAL DATA:  Shortness of breath. EXAM: PORTABLE CHEST 1 VIEW COMPARISON:  None. FINDINGS: Mild cardiomegaly. Normal mediastinal contours. No focal airspace disease, pleural effusion, or pneumothorax. No pulmonary edema. No acute osseous abnormalities. IMPRESSION: Mild cardiomegaly. No acute chest findings. Electronically Signed   By: Keith Rake M.D.   On: 07/06/2019 04:10        Scheduled Meds:  enoxaparin (LOVENOX) injection  40 mg Subcutaneous Q24H   hydrALAZINE  50 mg Oral BID   insulin aspart  0-15 Units Subcutaneous TID WC   insulin NPH Human  12 Units Subcutaneous BID AC & HS   sodium chloride flush  3 mL Intravenous Q12H   Continuous Infusions:  sodium chloride 100 mL/hr at 07/06/19 2319    ceFAZolin (ANCEF) IV     dextrose 5 % and 0.45% NaCl 100 mL/hr at 07/07/19 0414    Assessment & Plan:    1.  Sepsis/MSSA bacteremia secondary to L3-S1 epidural abscess/right iliopsoas and gluteus abscess: Source of infection could have been intramuscular Toradol injection on August 15.  Infection likely worsened in the setting of recent steroid use.  Blood cultures overnight growing MSSA.  Changed antibiotics to IV cefazolin (patient tolerated cefepime well although he has penicillin allergy listed) and consulted ID (discussed with Dr. Graylon Good).  Will obtain transthoracic echo.  Will likely need neurosurgical intervention/drainage of epidural abscess.  Will likely also need TEE at some point if transthoracic echo negative. IV fentanyl and oral oxycodone for better pain control  2.  DKA: Bicarb has now normalized with closure of anion gap.   Patient required 35 units of insulin via insulin drip since midnight.  Will transition to NPH twice daily/sliding scale and titrate per blood glucose levels.  Holding metformin in the setting of problem #1.  Check hemoglobin A1c to determine long-term need for insulin versus management with oral hypoglycemics.  3.  AKI: Present on admission with creatinine at 1.3.  Prerenal versus ATN in the setting of problem #1.  Improved  with IV hydration.  4.  Hypertension: Blood pressure normotensive as of now and systolic 0000000.  Watch for hypotension in the setting of problem #1.  Will reduce hydralazine dosage.  Will benefit from ACE inhibitors at the time of discharge in the setting of diabetes.  5.  Right calf swelling/redness: Secondary to bacteremia/seeding of infection versus DVT with phlebitis.  Doppler lower extremity ordered.  Continue antibiotics.  6. GERD: PPI  7.  Fatty liver/?  Early cirrhosis: Seen on CT abdomen.  Could be related to nonalcoholic fatty liver disease in the setting of hypertension, diabetes, morbid obesity.  8. Morbid obesity: : BMI 42.64 kg/m.  Follow-up PCP and discussions regarding weight loss when acute issues resolve   DVT prophylaxis: Lovenox Code Status: Full code Family / Patient Communication: Discussed with patient in detail and all questions answered to satisfaction.  Discussed with ID Disposition Plan: Home when medically cleared, may need short-term rehab     LOS: 1 day    Time spent: 45 minutes    Guilford Shi, MD [facility] Pager [phone]  If 7PM-7AM, please contact night-coverage [email or web address] Password [email or web address] 07/07/2019, 9:23 AM

## 2019-07-07 NOTE — Progress Notes (Signed)
Received pt for ED via bed by RN. Pt A&OX3, MAEW 9/10 throbbing pain to mid lower back. Pt was given Dilaudid 1mg  IVP. VS taken and placed on tele.Insulin infusing in r forearm @ 8.2 units/hr. POC reviewed with pt. Pt oriented to his room.

## 2019-07-07 NOTE — Progress Notes (Signed)
Inpatient Diabetes Program Recommendations  AACE/ADA: New Consensus Statement on Inpatient Glycemic Control (2015)  Target Ranges:  Prepandial:   less than 140 mg/dL      Peak postprandial:   less than 180 mg/dL (1-2 hours)      Critically ill patients:  140 - 180 mg/dL   Lab Results  Component Value Date   GLUCAP 133 (H) 07/07/2019   HGBA1C 12.6 (H) 07/07/2019    Review of Glycemic Control  Diabetes history: type 2 Outpatient Diabetes medications: Metformin 500 mg BID Current orders for Inpatient glycemic control: IV insulin Transition orders: NPH insulin 12 units BID, Novolog MODERATE correction scale TID  Inpatient Diabetes Program Recommendations:   Received diabetes coordinator consult. Spoke with staff RN in regards to patient for education. Patient is in extreme pain today, so will wait until appropriate to talk with patient about his diabetes. Staff RNs can work with patient on insulin administration when appropriate.   Will continue to monitor blood sugars while in the hospital and follow up with consult.   Harvel Ricks RN BSN CDE Diabetes Coordinator Pager: [phone]  8am-5pm

## 2019-07-07 NOTE — Progress Notes (Signed)
Patient ID: Bradley Cox, male   DOB: 12-01-1970, 48 y.o.   MRN: [id number] Overall no significant change in his pain level he feels like he is got increased restriction of movement proximal right lower extremity.  On exam significantly pain limited in the right lower extremity's are difficult but distally he is 5 out of 5 dorsiflexion and plantar flexion 4- out of 5 quadricep but very pain limited iliopsoas and hamstrings are also very pain limited in 2-3 out of 5  After we review MRI scan most of the epidural abscess component is all left in his left leg is virtually asymptomatic.  I feel that most of the right lower extremity represents lumbar plexus and femoral nerve involvement there is an extensive amount of abscess fluid that tracks into the psoas around the lumbar plexus and femoral nerve on the right and this was and appears to be in communication with the posterior paraspinal abscess that was aspirated by interventional radiology via CT. continued IV antibiotics and therapy I do not see an indication to surgically decompress the spinal canal.

## 2019-07-07 NOTE — Consult Note (Addendum)
[facility] for Infectious Disease  Total days of antibiotics 3/1 cefazolin               Reason for Consult:mssa bacteremia with epidural abscess    Referring Physician: kamineni  Principal Problem:   Sepsis ([place]) Active Problems:   Obesity, Class III, BMI 40-49.9 (morbid obesity) ([place])   Essential hypertension   Abscess   AKI (acute kidney injury) ([place])   Hyperkalemia    HPI: Bradley Cox is a 48 y.o. male with hx of HTN, DM, obesity admitted on 9/3 for fever, right flank pain x 3 weeks, has been intermitttenly seen at UC worked up for kidney stone, given toradol for pain management but presented to ED due to inability with walking. He was found to have SIRS criteria with fever of 103, tachycardia, tachypnea. WBC of 10K but imaging (mri pelvis and lumbar spine) found evidence of multifocal abscess including psoa/paraspinal region measuring 5.5 x 6 cm as well as large gluteal collection 10x 6 with evidence of epidural abscess L3-S1. He underwent L4-L5 paraspinal fluid collection aspirate by IR that yielded 44mL murky fluid. In the meantime, his blood cx + MSSA. TTE showed mildly thickened AV, though poor quality given imaging windows. He states that he had stepped on object that tore skin on plantar aspect of right foot but then shortly after also started to notice swelling to right leg. He is in discomfort despite starting iv abtx, and pain medication.  His abtx narrowed to cefazolin due to penicillin allergy but appears to tolerate. Interestingly when he presented to the ED 8/17 with right flank pain -ur cx 100,000 MSSA but there was no associated blood cx taken at that time.  I reviewed images with dr Saintclair Halsted appears that right leg weakness corresponds to right psoas/paraspinal abscess with surrounding inflammation likely impacting nerve rootlets. Small epidural abscess L3-S1 appears more leftside of canal.  Dr Saintclair Halsted mentions given his physical exam -decompression not likely to  help. Will continue to follow closely with serial exams.  l4-l5 left sided compression at nerve rootlets on imaging doesn't correspond to physical exam  Past Medical History:  Diagnosis Date   Arthritis    Asthma    Diabetes mellitus, type 2 (HCC)    Hypertension    Morbid obesity (HCC)    OSA (obstructive sleep apnea)    Allergies:  Allergies  Allergen Reactions   Penicillins Anaphylaxis    Did it involve swelling of the face/tongue/throat, SOB, or low BP? Yes Did it involve sudden or severe rash/hives, skin peeling, or any reaction on the inside of your mouth or nose? No Did you need to seek medical attention at a hospital or doctor's office? Yes When did it last happen?4 months old If all above answers are NO, may proceed with cephalosporin use.    Tramadol Anaphylaxis    MEDICATIONS:  enoxaparin (LOVENOX) injection  40 mg Subcutaneous Q24H   gabapentin  300 mg Oral BID   hydrALAZINE  25 mg Oral BID   insulin aspart  0-15 Units Subcutaneous TID WC   insulin NPH Human  12 Units Subcutaneous BID AC & HS   sodium chloride flush  3 mL Intravenous Q12H    Social History   Tobacco Use   Smoking status: Never Smoker   Smokeless tobacco: Current User    Types: Snuff  Substance Use Topics   Alcohol use: Yes    Comment: rarely   Drug use: Never  Family History  Problem Relation Age of Onset   Diabetes Paternal Grandmother     Review of Systems  Constitutional: Negative for fever, chills, diaphoresis, activity change, appetite change, fatigue and unexpected weight change.  HENT: Negative for congestion, sore throat, rhinorrhea, sneezing, trouble swallowing and sinus pressure.  Eyes: Negative for photophobia and visual disturbance.  Respiratory: Negative for cough, chest tightness, shortness of breath, wheezing and stridor.  Cardiovascular: Negative for chest pain, palpitations and leg swelling.  Gastrointestinal: Negative for nausea, vomiting,  abdominal pain, diarrhea, constipation, blood in stool, abdominal distention and anal bleeding.  Genitourinary: Negative for dysuria, hematuria, flank pain and difficulty urinating.  Musculoskeletal: Negative for myalgias, back pain, joint swelling, arthralgias and gait problem.  Skin: Negative for color change, pallor, rash and wound.  Neurological: right leg weakness, swelling and pain to right calf Hematological: Negative for adenopathy. Does not bruise/bleed easily.  Psychiatric/Behavioral: Negative for behavioral problems, confusion, sleep disturbance, dysphoric mood, decreased concentration and agitation.      OBJECTIVE: Temp:  [98.5 F (36.9 C)-100.2 F (37.9 C)] 99.8 F (37.7 C) (09/05 1234) Pulse Rate:  [103-144] 105 (09/05 1000) Resp:  [12-25] 19 (09/05 1000) BP: (113-166)/(66-99) 129/82 (09/05 1234) SpO2:  [85 %-98 %] 96 % (09/05 1000) Weight:  [164 kg] 164 kg (09/04 2116) Physical Exam  Constitutional: He is oriented to person, place, and time. He appears well-developed and well-nourished. No distress.  HENT:  Mouth/Throat: Oropharynx is clear and moist. No oropharyngeal exudate.  Cardiovascular: Normal rate, regular rhythm and normal heart sounds. Exam reveals no gallop and no friction rub.  No murmur heard.  Pulmonary/Chest: Effort normal and breath sounds normal. No respiratory distress. He has no wheezes.  Abdominal: Soft. Bowel sounds are normal. He exhibits no distension. There is no tenderness.  Lymphadenopathy:  He has no cervical adenopathy.  Neurological: He is alert and oriented to person, place, and time. Weakness in raising right leg. Skin: Skin is warm and dry. No rash noted. No erythema. Healed blister/echymosis to right foot -plantar aspect. ? scatttered petechaie on feet. Tender to right calf + warthm Psychiatric: He has a normal mood and affect. His behavior is normal.     LABS: Results for orders placed or performed during the hospital encounter of  07/05/19 (from the past 48 hour(s))  CBC with Differential     Status: Abnormal   Collection Time: 07/06/19  3:59 AM  Result Value Ref Range   WBC 10.4 4.0 - 10.5 K/uL   RBC 4.52 4.22 - 5.81 MIL/uL   Hemoglobin 14.0 13.0 - 17.0 g/dL   HCT 42.6 39.0 - 52.0 %   MCV 94.2 80.0 - 100.0 fL   MCH 31.0 26.0 - 34.0 pg   MCHC 32.9 30.0 - 36.0 g/dL   RDW 12.9 11.5 - 15.5 %   Platelets 256 150 - 400 K/uL    Comment: REPEATED TO VERIFY   nRBC 0.0 0.0 - 0.2 %   Neutrophils Relative % 81 %   Neutro Abs 8.6 (H) 1.7 - 7.7 K/uL   Lymphocytes Relative 8 %   Lymphs Abs 0.8 0.7 - 4.0 K/uL   Monocytes Relative 9 %   Monocytes Absolute 1.0 0.1 - 1.0 K/uL   Eosinophils Relative 0 %   Eosinophils Absolute 0.0 0.0 - 0.5 K/uL   Basophils Relative 1 %   Basophils Absolute 0.1 0.0 - 0.1 K/uL   Immature Granulocytes 1 %   Abs Immature Granulocytes 0.05 0.00 - 0.07 K/uL  Comment: Performed at [facility], [place] [address] [place], [place] [address]  Lactic acid, plasma     Status: Abnormal   Collection Time: 07/06/19  3:59 AM  Result Value Ref Range   Lactic Acid, Venous 3.0 (HH) 0.5 - 1.9 mmol/L    Comment: CRITICAL RESULT CALLED TO, READ BACK BY AND VERIFIED WITH: M.BROOKS,RN 07/06/2019 0513 DAVISB Performed at [facility], [place] [address] [place], [place] [address]   Blood culture (routine x 2)     Status: Abnormal (Preliminary result)   Collection Time: 07/06/19  4:30 AM   Specimen: BLOOD  Result Value Ref Range   Specimen Description BLOOD SITE NOT SPECIFIED    Special Requests      BOTTLES DRAWN AEROBIC AND ANAEROBIC Blood Culture results may not be optimal due to an inadequate volume of blood received in culture bottles   Culture  Setup Time      IN BOTH AEROBIC AND ANAEROBIC BOTTLES GRAM POSITIVE COCCI Organism ID to follow CRITICAL RESULT CALLED TO, READ BACK BY AND VERIFIED WITH: J MILLEN PHARMD 07/06/19 2058 JDW    Culture (A)     STAPHYLOCOCCUS AUREUS SUSCEPTIBILITIES TO  FOLLOW Performed at [facility], [place] [address] [place], [place] [address]    Report Status PENDING   Blood culture (routine x 2)     Status: Abnormal (Preliminary result)   Collection Time: 07/06/19  4:30 AM   Specimen: BLOOD  Result Value Ref Range   Specimen Description BLOOD SITE NOT SPECIFIED    Special Requests      BOTTLES DRAWN AEROBIC AND ANAEROBIC Blood Culture adequate volume   Culture  Setup Time      IN BOTH AEROBIC AND ANAEROBIC BOTTLES GRAM POSITIVE COCCI CRITICAL VALUE NOTED.  VALUE IS CONSISTENT WITH PREVIOUSLY REPORTED AND CALLED VALUE. Performed at [facility], [place] [address], [place], [place] [address]    Culture STAPHYLOCOCCUS AUREUS (A)    Report Status PENDING   Blood Culture ID Panel (Reflexed)     Status: Abnormal   Collection Time: 07/06/19  4:30 AM  Result Value Ref Range   Enterococcus species NOT DETECTED NOT DETECTED   Listeria monocytogenes NOT DETECTED NOT DETECTED   Staphylococcus species DETECTED (A) NOT DETECTED    Comment: CRITICAL RESULT CALLED TO, READ BACK BY AND VERIFIED WITH: J MILLEN PHARMD 07/06/19 2058 JDW    Staphylococcus aureus (BCID) DETECTED (A) NOT DETECTED    Comment: Methicillin (oxacillin) susceptible Staphylococcus aureus (MSSA). Preferred therapy is anti staphylococcal beta lactam antibiotic (Cefazolin or Nafcillin), unless clinically contraindicated. CRITICAL RESULT CALLED TO, READ BACK BY AND VERIFIED WITH: Dion Body [facility] 07/06/19 2058 JDW    Methicillin resistance NOT DETECTED NOT DETECTED   Streptococcus species NOT DETECTED NOT DETECTED   Streptococcus agalactiae NOT DETECTED NOT DETECTED   Streptococcus pneumoniae NOT DETECTED NOT DETECTED   Streptococcus pyogenes NOT DETECTED NOT DETECTED   Acinetobacter baumannii NOT DETECTED NOT DETECTED   Enterobacteriaceae species NOT DETECTED NOT DETECTED   Enterobacter cloacae complex NOT DETECTED NOT DETECTED   Escherichia coli NOT DETECTED NOT DETECTED    Klebsiella oxytoca NOT DETECTED NOT DETECTED   Klebsiella pneumoniae NOT DETECTED NOT DETECTED   Proteus species NOT DETECTED NOT DETECTED   Serratia marcescens NOT DETECTED NOT DETECTED   Haemophilus influenzae NOT DETECTED NOT DETECTED   Neisseria meningitidis NOT DETECTED NOT DETECTED   Pseudomonas aeruginosa NOT DETECTED NOT DETECTED   Candida albicans NOT  DETECTED NOT DETECTED   Candida glabrata NOT DETECTED NOT DETECTED   Candida krusei NOT DETECTED NOT DETECTED   Candida parapsilosis NOT DETECTED NOT DETECTED   Candida tropicalis NOT DETECTED NOT DETECTED    Comment: Performed at [facility], [place] [address], [place], [place] [address]  Urinalysis, Routine w reflex microscopic     Status: Abnormal   Collection Time: 07/06/19  4:37 AM  Result Value Ref Range   Color, Urine YELLOW YELLOW   APPearance CLEAR CLEAR   Specific Gravity, Urine 1.024 1.005 - 1.030   pH 5.0 5.0 - 8.0   Glucose, UA >=500 (A) NEGATIVE mg/dL   Hgb urine dipstick MODERATE (A) NEGATIVE   Bilirubin Urine NEGATIVE NEGATIVE   Ketones, ur 80 (A) NEGATIVE mg/dL   Protein, ur NEGATIVE NEGATIVE mg/dL   Nitrite NEGATIVE NEGATIVE   Leukocytes,Ua NEGATIVE NEGATIVE   RBC / HPF 6-10 0 - 5 RBC/hpf   WBC, UA 0-5 0 - 5 WBC/hpf   Bacteria, UA RARE (A) NONE SEEN   Mucus PRESENT    Budding Yeast PRESENT     Comment: Performed at [place] [address] [place], [place] [address]  Urine culture     Status: None (Preliminary result)   Collection Time: 07/06/19  4:37 AM   Specimen: Urine, Random  Result Value Ref Range   Specimen Description URINE, RANDOM    Special Requests NONE    Culture      CULTURE REINCUBATED FOR BETTER GROWTH Performed at [facility], [place] [address], [place], [place] [address]    Report Status PENDING   Comprehensive metabolic panel     Status: Abnormal   Collection Time: 07/06/19  5:26 AM  Result Value Ref Range   Sodium 127 (L) 135 - 145 mmol/L   Potassium 6.1 (H)  3.5 - 5.1 mmol/L   Chloride 88 (L) 98 - 111 mmol/L   CO2 21 (L) 22 - 32 mmol/L   Glucose, Bld 524 (HH) 70 - 99 mg/dL    Comment: CRITICAL RESULT CALLED TO, READ BACK BY AND VERIFIED WITH: BROOKS,M RN @0645  ON [id number] BY FLEMINGS    BUN 21 (H) 6 - 20 mg/dL   Creatinine, Ser 1.36 (H) 0.61 - 1.24 mg/dL   Calcium 9.2 8.9 - 10.3 mg/dL   Total Protein 7.7 6.5 - 8.1 g/dL   Albumin 1.9 (L) 3.5 - 5.0 g/dL   AST 25 15 - 41 U/L   ALT 19 0 - 44 U/L   Alkaline Phosphatase 84 38 - 126 U/L   Total Bilirubin 1.0 0.3 - 1.2 mg/dL   GFR calc non Af Amer >60 >60 mL/min   GFR calc Af Amer >60 >60 mL/min   Anion gap 18 (H) 5 - 15    Comment: Performed at [place] [address], [place],  [address]  SARS Coronavirus 2 [facility] order, Performed in [facility] hospital lab) Nasopharyngeal Nasopharyngeal Swab     Status: None   Collection Time: 07/06/19  8:04 AM   Specimen: Nasopharyngeal Swab  Result Value Ref Range   SARS Coronavirus 2 NEGATIVE NEGATIVE    Comment: (NOTE) If result is NEGATIVE SARS-CoV-2 target nucleic acids are NOT DETECTED. The SARS-CoV-2 RNA is generally detectable in upper and lower  respiratory specimens during the acute phase of infection. The lowest  concentration of SARS-CoV-2 viral copies this assay can detect is 250  copies / mL. A negative result does  not preclude SARS-CoV-2 infection  and should not be used as the sole basis for treatment or other  patient management decisions.  A negative result may occur with  improper specimen collection / handling, submission of specimen other  than nasopharyngeal swab, presence of viral mutation(s) within the  areas targeted by this assay, and inadequate number of viral copies  (<250 copies / mL). A negative result must be combined with clinical  observations, patient history, and epidemiological information. If result is POSITIVE SARS-CoV-2 target nucleic acids are DETECTED. The SARS-CoV-2 RNA is generally  detectable in upper and lower  respiratory specimens dur ing the acute phase of infection.  Positive  results are indicative of active infection with SARS-CoV-2.  Clinical  correlation with patient history and other diagnostic information is  necessary to determine patient infection status.  Positive results do  not rule out bacterial infection or co-infection with other viruses. If result is PRESUMPTIVE POSTIVE SARS-CoV-2 nucleic acids MAY BE PRESENT.   A presumptive positive result was obtained on the submitted specimen  and confirmed on repeat testing.  While 2019 novel coronavirus  (SARS-CoV-2) nucleic acids may be present in the submitted sample  additional confirmatory testing may be necessary for epidemiological  and / or clinical management purposes  to differentiate between  SARS-CoV-2 and other Sarbecovirus currently known to infect humans.  If clinically indicated additional testing with an alternate test  methodology [phone]) is advised. The SARS-CoV-2 RNA is generally  detectable in upper and lower respiratory sp ecimens during the acute  phase of infection. The expected result is Negative. Fact Sheet for Patients:  StrictlyIdeas.no Fact Sheet for Healthcare Providers: BankingDealers.co.za This test is not yet approved or cleared by the [place] FDA and has been authorized for detection and/or diagnosis of SARS-CoV-2 by FDA under an Emergency Use Authorization (EUA).  This EUA will remain in effect (meaning this test can be used) for the duration of the COVID-19 declaration under Section 564(b)(1) of the Act, 21 U.S.C. section 360bbb-3(b)(1), unless the authorization is terminated or revoked sooner. Performed at [facility], [place] [address], [place], [place] [address]   CBG monitoring, ED     Status: Abnormal   Collection Time: 07/06/19  1:27 PM  Result Value Ref Range   Glucose-Capillary 420 (H) 70 - 99 mg/dL    Comment 1 Notify RN    Comment 2 Document in Chart   Lactic acid, plasma     Status: Abnormal   Collection Time: 07/06/19  2:45 PM  Result Value Ref Range   Lactic Acid, Venous 2.2 (HH) 0.5 - 1.9 mmol/L    Comment: CRITICAL VALUE NOTED.  VALUE IS CONSISTENT WITH PREVIOUSLY REPORTED AND CALLED VALUE. Performed at [facility], [place] [address], [place], [place] [address]   CBG monitoring, ED     Status: Abnormal   Collection Time: 07/06/19  3:27 PM  Result Value Ref Range   Glucose-Capillary 370 (H) 70 - 99 mg/dL  CBG monitoring, ED     Status: Abnormal   Collection Time: 07/06/19  4:38 PM  Result Value Ref Range   Glucose-Capillary 300 (H) 70 - 99 mg/dL  HIV antibody (Routine Testing)     Status: None   Collection Time: 07/06/19  5:18 PM  Result Value Ref Range   HIV Screen 4th Generation wRfx Non Reactive Non Reactive    Comment: (NOTE) Performed At: [facility] [address] [place], [place] [id number] Rush Farmer MD [id number]  I-stat chem 8, ED (not at [facility] or [facility])     Status: Abnormal   Collection Time: 07/06/19  5:38 PM  Result Value Ref Range   Sodium 132 (L) 135 - 145 mmol/L   Potassium 4.6 3.5 - 5.1 mmol/L   Chloride 96 (L) 98 - 111 mmol/L   BUN 17 6 - 20 mg/dL   Creatinine, Ser 0.60 (L) 0.61 - 1.24 mg/dL   Glucose, Bld 338 (H) 70 - 99 mg/dL   Calcium, Ion 1.16 1.15 - 1.40 mmol/L   TCO2 25 22 - 32 mmol/L   Hemoglobin 12.6 (L) 13.0 - 17.0 g/dL   HCT 37.0 (L) 39.0 - 52.0 %  CBG monitoring, ED     Status: Abnormal   Collection Time: 07/06/19  5:51 PM  Result Value Ref Range   Glucose-Capillary 311 (H) 70 - 99 mg/dL  Aerobic/Anaerobic Culture (surgical/deep wound)     Status: None (Preliminary result)   Collection Time: 07/06/19  6:48 PM   Specimen: Abscess  Result Value Ref Range   Specimen Description ABSCESS    Special Requests PARASPINAL    Gram Stain      FEW WBC PRESENT, PREDOMINANTLY PMN ABUNDANT GRAM POSITIVE COCCI Performed at  [facility], [address], [place], [place] [address]    Culture PENDING    Report Status PENDING   CBG monitoring, ED     Status: Abnormal   Collection Time: 07/06/19  6:59 PM  Result Value Ref Range   Glucose-Capillary 255 (H) 70 - 99 mg/dL  CBG monitoring, ED     Status: Abnormal   Collection Time: 07/06/19  8:16 PM  Result Value Ref Range   Glucose-Capillary 197 (H) 70 - 99 mg/dL  Glucose, capillary     Status: Abnormal   Collection Time: 07/06/19  9:32 PM  Result Value Ref Range   Glucose-Capillary 137 (H) 70 - 99 mg/dL   Comment 1 Notify RN    Comment 2 Document in Chart   C-reactive protein     Status: Abnormal   Collection Time: 07/06/19 10:13 PM  Result Value Ref Range   CRP 43.5 (H) <1.0 mg/dL    Comment: Performed at [facility], [place] [address], [place], [place] [address]  Sedimentation rate     Status: Abnormal   Collection Time: 07/06/19 10:13 PM  Result Value Ref Range   Sed Rate 120 (H) 0 - 16 mm/hr    Comment: Performed at [facility] [address], [place], [place] [id number]  Basic metabolic panel     Status: Abnormal   Collection Time: 07/06/19 10:13 PM  Result Value Ref Range   Sodium 132 (L) 135 - 145 mmol/L   Potassium 4.8 3.5 - 5.1 mmol/L   Chloride 95 (L) 98 - 111 mmol/L   CO2 26 22 - 32 mmol/L   Glucose, Bld 107 (H) 70 - 99 mg/dL   BUN 14 6 - 20 mg/dL   Creatinine, Ser 0.75 0.61 - 1.24 mg/dL   Calcium 8.4 (L) 8.9 - 10.3 mg/dL   GFR calc non Af Amer >60 >60 mL/min   GFR calc Af Amer >60 >60 mL/min   Anion gap 11 5 - 15    Comment: Performed at [facility], [place] [address] [place], [place] [address]  Lactic acid, plasma     Status: Abnormal   Collection Time: 07/06/19 10:13 PM  Result Value Ref Range   Lactic Acid,  Venous 2.6 (HH) 0.5 - 1.9 mmol/L    Comment: CRITICAL VALUE NOTED.  VALUE IS CONSISTENT WITH PREVIOUSLY REPORTED AND CALLED VALUE. Performed at [facility], [place] [address] [place], [place]  [address]   Glucose, capillary     Status: Abnormal   Collection Time: 07/06/19 10:47 PM  Result Value Ref Range   Glucose-Capillary 141 (H) 70 - 99 mg/dL  Glucose, capillary     Status: Abnormal   Collection Time: 07/06/19 11:56 PM  Result Value Ref Range   Glucose-Capillary 161 (H) 70 - 99 mg/dL  Glucose, capillary     Status: Abnormal   Collection Time: 07/07/19  1:00 AM  Result Value Ref Range   Glucose-Capillary 202 (H) 70 - 99 mg/dL  Glucose, capillary     Status: Abnormal   Collection Time: 07/07/19  1:59 AM  Result Value Ref Range   Glucose-Capillary 159 (H) 70 - 99 mg/dL  Glucose, capillary     Status: Abnormal   Collection Time: 07/07/19  3:09 AM  Result Value Ref Range   Glucose-Capillary 123 (H) 70 - 99 mg/dL  Glucose, capillary     Status: Abnormal   Collection Time: 07/07/19  4:01 AM  Result Value Ref Range   Glucose-Capillary 105 (H) 70 - 99 mg/dL  Glucose, capillary     Status: Abnormal   Collection Time: 07/07/19  4:32 AM  Result Value Ref Range   Glucose-Capillary 112 (H) 70 - 99 mg/dL  Basic metabolic panel     Status: Abnormal   Collection Time: 07/07/19  5:21 AM  Result Value Ref Range   Sodium 127 (L) 135 - 145 mmol/L   Potassium 4.1 3.5 - 5.1 mmol/L   Chloride 92 (L) 98 - 111 mmol/L   CO2 24 22 - 32 mmol/L   Glucose, Bld 130 (H) 70 - 99 mg/dL   BUN 11 6 - 20 mg/dL   Creatinine, Ser 0.64 0.61 - 1.24 mg/dL   Calcium 8.2 (L) 8.9 - 10.3 mg/dL   GFR calc non Af Amer >60 >60 mL/min   GFR calc Af Amer >60 >60 mL/min   Anion gap 11 5 - 15    Comment: Performed at [facility], [place] [address] [place], [place] [address]  CBC     Status: Abnormal   Collection Time: 07/07/19  5:21 AM  Result Value Ref Range   WBC 6.7 4.0 - 10.5 K/uL   RBC 3.70 (L) 4.22 - 5.81 MIL/uL   Hemoglobin 11.1 (L) 13.0 - 17.0 g/dL   HCT 34.2 (L) 39.0 - 52.0 %   MCV 92.4 80.0 - 100.0 fL   MCH 30.0 26.0 - 34.0 pg   MCHC 32.5 30.0 - 36.0 g/dL   RDW 12.6 11.5 - 15.5 %    Platelets 197 150 - 400 K/uL   nRBC 0.0 0.0 - 0.2 %    Comment: Performed at [facility], [place] [address] [place], [place] [address]  Glucose, capillary     Status: Abnormal   Collection Time: 07/07/19  5:30 AM  Result Value Ref Range   Glucose-Capillary 135 (H) 70 - 99 mg/dL  Glucose, capillary     Status: Abnormal   Collection Time: 07/07/19  6:44 AM  Result Value Ref Range   Glucose-Capillary 141 (H) 70 - 99 mg/dL  Glucose, capillary     Status: Abnormal   Collection Time: 07/07/19  7:33 AM  Result Value Ref Range   Glucose-Capillary  153 (H) 70 - 99 mg/dL  Hemoglobin A1c     Status: Abnormal   Collection Time: 07/07/19  9:00 AM  Result Value Ref Range   Hgb A1c MFr Bld 12.6 (H) 4.8 - 5.6 %    Comment: (NOTE) Pre diabetes:          5.7%-6.4% Diabetes:              >6.4% Glycemic control for   <7.0% adults with diabetes    Mean Plasma Glucose 314.92 mg/dL    Comment: Performed at [facility] [address], [place], [place] [address]  Glucose, capillary     Status: Abnormal   Collection Time: 07/07/19  9:31 AM  Result Value Ref Range   Glucose-Capillary 133 (H) 70 - 99 mg/dL  Glucose, capillary     Status: Abnormal   Collection Time: 07/07/19 10:34 AM  Result Value Ref Range   Glucose-Capillary 143 (H) 70 - 99 mg/dL  Glucose, capillary     Status: Abnormal   Collection Time: 07/07/19 12:07 PM  Result Value Ref Range   Glucose-Capillary 170 (H) 70 - 99 mg/dL    MICRO:  IMAGING: Mr Lumbar Spine W Wo Contrast  Result Date: 07/06/2019 CLINICAL DATA:  Infection.  Rule out epidural abscess. EXAM: MRI LUMBAR SPINE WITHOUT AND WITH CONTRAST TECHNIQUE: Multiplanar and multiecho pulse sequences of the lumbar spine were obtained without and with intravenous contrast. CONTRAST:  10 mL Gadovist IV COMPARISON:  CT abdomen pelvis in CT lumbar spine 07/06/2019 FINDINGS: Segmentation:  Normal Alignment:  Mild retrolisthesis L1-2 and L4-5 and L5-S1. Vertebrae: Negative for  vertebral fracture or mass. No vertebral edema or abnormal enhancement. No evidence of discitis. Conus medullaris and cauda equina: Conus extends to the L1-2 level. Conus and cauda equina appear normal. Paraspinal and other soft tissues: Large enhancing fluid collection in the right paraspinous muscle at the L4 level measures 5.5 x 6 cm compatible with abscess. Adjacent facet joints show mild degeneration but no definite infection. Multilocular large fluid collection in the right iliopsoas muscle compatible with abscess. This measures at least 5 x 6 cm. Large multilocular fluid collection in the right gluteus muscle measures at least 5 cm. Abscess in the iliopsoas and gluteus muscle appear to communicate through the sciatic notch. Disc levels: L1-2: Disc and facet degeneration. Shallow left-sided disc protrusion with mild subarticular stenosis on the left. L2-3: Negative L3-4: Normal disc space. Small epidural abscess posteriorly on the left. Bilateral facet degeneration without evidence of facet joint infection. L4-5: Mild retrolisthesis. Disc degeneration and facet degeneration. Small extraforaminal disc protrusion on the left. Epidural abscess posteriorly on the left compressing the thecal sac and contributing to moderate spinal stenosis. No evidence of facet joint infection. L5-S1: Disc degeneration with left-sided disc protrusion. Posterior epidural abscess is ill-defined but is contributing to compression of thecal sac. There is enhancement and fluid along the right S1 nerve root which may be the route of spread to the epidural space. No evidence of facet infection. IMPRESSION: 1. Multiple areas of infection. Large areas of abscess formation in the right paraspinous muscle at the L4 level. Large abscess in the right iliopsoas muscle and also in the right gluteus muscle. 2. 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 This may have entered the spinal canal via the right S1 nerve root. No evidence of  discitis or facet joint infection. Electronically Signed   By: Franchot Gallo M.D.   On: 07/06/2019 13:34  Mr Pelvis W Wo Contrast  Result Date: 07/06/2019 CLINICAL DATA:  Right-sided low back pain for several weeks. No recent injury. Recent antibiotic therapy for urinary tract infection. EXAM: MRI PELVIS WITHOUT AND WITH CONTRAST TECHNIQUE: Multiplanar multisequence MR imaging of the pelvis was performed both before and after administration of intravenous contrast. CONTRAST:  10 cc Gadavist COMPARISON:  Abdominopelvic CT 07/06/2019 and 06/18/2019. FINDINGS: Urinary Tract: The visualized distal ureters and bladder appear unremarkable. Bowel: No bowel wall thickening, distention or surrounding inflammation identified within the pelvis. Vascular/Lymphatic: No enlarged pelvic lymph nodes identified. No significant vascular findings. Reproductive: There are areas T2 hyperintensity and decreased enhancement centrally in the prostate gland bilaterally which may be secondary to benign prosthetic hypertrophy or prostatitis. There is no surrounding inflammatory change. The seminal vesicles appear normal. Other: No ascites. Musculoskeletal: As demonstrated on earlier CT scan, there are multiple complex fluid collections surrounding the right sacroiliac joint. These demonstrate internal air bubbles, heterogeneity and irregular peripheral enhancement. Dominant component laterally in the gluteus musculature measures up to 9.8 x 6.1 cm on image 18/13. There is a component in the right issue femoral fossa measuring up to 5.2 cm on image 27/3. This demonstrates inferior extension around the right common hamstring tendon. There are intrapelvic components extending through the greater sciatic foramen, involving the right piriformis muscle and measuring up to 6.3 cm on image 13/12. There are also irregular components within the right iliopsoas muscle measuring up to 3.8 cm on image 9/13. There are right posterior paraspinal  components within the erector spinae musculature, measuring up to 6.2 cm on image 1/13. There is mild T2 hyperintensity and enhancement within the right sacroiliac joint, with associated marrow edema and enhancement in the adjacent right sacral ala. T2 hyperintensity and enhancement are also present within the left proximal common hamstring muscle. There are no focal fluid collections in the left pelvis. No significant hip joint effusion or abnormal synovial enhancement. IMPRESSION: 1. As seen on earlier CT, there are multiple complex fluid collections in the right pelvis with internal air and peripheral enhancement consistent with multifocal abscesses. 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 2. Inflammation extends into the erector spinae musculature on the right. Lumbar spine findings are dictated separately. 3. Mild T2 hyperintensity within the proximal left common hamstring muscle. 4. Central heterogeneity within the prostate gland is removed from these other findings and probably due to BPH. Prostatitis could have this appearance. Electronically Signed   By: Richardean Sale M.D.   On: 07/06/2019 13:32   Ct Abdomen Pelvis W Contrast  Addendum Date: 07/06/2019   ADDENDUM REPORT: 07/06/2019 08:20 ADDENDUM: Original report by Dr. Golden Circle. Addendum by Dr. Jeralyn Ruths for contrast extravasation documentation: Type of contrast:  Isovue 300 Site of extravasation: Right AC Estimated volume of extravasation: 40 ml Area of extravasation scanned with CT? no PATIENT'S SIGNS AND SYMPTOMS Skin blistering/ulceration: no Decrease capillary refill: no Change in skin color: no Decreased motor function or severe tightness: no Decreased pulses distal to site of extravasation: no Altered sensation: no Increasing pain or signs of increased swelling during observation: no TREATMENT An ice  pack was applied with instructions given to the patient for limb elevation and warning signs to look for. The patient was returned to the emergency department. Electronically Signed   By: Logan Bores M.D.   On: 07/06/2019 08:20   Result Date: 07/06/2019  CLINICAL DATA:  Right-sided pain for several weeks, initial encounter EXAM: CT ABDOMEN AND PELVIS WITH CONTRAST TECHNIQUE: Multidetector CT imaging of the abdomen and pelvis was performed using the standard protocol following bolus administration of intravenous contrast. CONTRAST:  154mL ISOVUE-300 IOPAMIDOL (ISOVUE-300) INJECTION 61% COMPARISON:  06/18/2019 FINDINGS: Lower chest: Lung bases are well aerated bilaterally. There is a vague nodular density identified on the first image within the right middle lobe. This area was not covered on the prior exam. Hepatobiliary: Liver is diffusely decreased in attenuation consistent with fatty infiltration. The gallbladder is decompressed. Scattered calcifications are noted within consistent with stones. No obstructive changes are noted. Pancreas: Unremarkable. No pancreatic ductal dilatation or surrounding inflammatory changes. Spleen: Normal in size without focal abnormality. Adrenals/Urinary Tract: Adrenal glands are within normal limits. The kidneys are well visualized with renal calculi stable in appearance from the prior exam. The largest of these lies in the lower pole measuring approximately 9 mm. The left kidney demonstrates no renal calculi. Very mild prominence of the collecting system and ureter on the right are seen similar to that noted on the prior exam without obstructing lesion. The bladder is well distended. Stomach/Bowel: No obstructive or inflammatory changes of the colon are seen. The appendix is not well visualized. No obstructive or inflammatory changes of the small bowel are seen. The stomach is unremarkable. Vascular/Lymphatic: No significant vascular findings are present. No enlarged abdominal or  pelvic lymph nodes. Reproductive: Prostate is unremarkable. Other: No abdominal wall hernia or abnormality. No abdominopelvic ascites. Musculoskeletal: There are changes within the right buttock, right iliac fossa and perispinal musculature on the right with fluid collections and air foci identified consistent with localized infection. The fluid collection within the right paraspinal musculature measures 5.6 x 5.5 cm consistent with a focal abscess. Similar extension into the iliac fossa on the right as well as the musculature of the right buttocks is seen. Soft tissue density in the presacral region and along the right pelvic wall are noted consistent with focal localized infection as well. IMPRESSION: Changes consistent with localized abscess involving the right iliac fossa, musculature of the right buttocks and right paraspinal musculature with extension into the lateral aspect of the pelvis on the right. Percutaneous drainage of the paraspinal collection may be helpful. These changes are all new from the prior exam. Stable renal calculi on the right. Stable cholelithiasis and fatty infiltration of the liver. Electronically Signed: By: Inez Catalina M.D. On: 07/06/2019 07:55   Ct Aspiration  Result Date: 07/06/2019 INDICATION: 48 year old with multiple sites of infection including a lumbar paraspinal muscular abscess. Plan for CT-guided aspiration of the paraspinal abscess collection. EXAM: CT-GUIDED ASPIRATION OF PARASPINAL ABSCESS MEDICATIONS: Moderate sedation ANESTHESIA/SEDATION: Fentanyl 100 mcg IV; Versed 2.0 mg IV Moderate Sedation Time:  10 minutes The patient was continuously monitored during the procedure by the interventional radiology nurse under my direct supervision. COMPLICATIONS: None immediate. PROCEDURE: Informed written consent was obtained from the patient after a thorough discussion of the procedural risks, benefits and alternatives. All questions were addressed. A timeout was performed  prior to the initiation of the procedure. Patient was placed prone on the CT scanner. CT images through the lower abdomen were obtained. The right paraspinal muscular abscess was identified at the L4-L5 level. Overlying skin was prepped with chlorhexidine. Sterile field was created. Skin was anesthetized with 1% lidocaine. Yueh catheter was directed into the abscess collection with CT guidance. Approximately 15 mL of brown purulent fluid was aspirated. No additional fluid  could be aspirated. At this point in the procedure the patient was in severe pain from lying prone and the procedure was stopped. Bandage placed over the puncture site. FINDINGS: Low-density abscess in the right paraspinal musculature at L4-L5. 15 mL of brown purulent fluid was removed from this collection. IMPRESSION: CT-guided aspiration of the right paraspinal abscess at L4-L5. 15 mL of purulent fluid was removed and sent for culture. Electronically Signed   By: Markus Daft M.D.   On: 07/06/2019 19:22   Ct L-spine No Charge  Result Date: 07/06/2019 CLINICAL DATA:  Right-sided back pain and right lower extremity pain for 3 weeks. EXAM: CT LUMBAR SPINE WITHOUT CONTRAST TECHNIQUE: Multidetector CT imaging of the lumbar spine was performed without intravenous contrast administration. Multiplanar CT image reconstructions were also generated. COMPARISON:  None. FINDINGS: Segmentation: 5 lumbar type vertebrae. Alignment: Normal. Vertebrae: No acute fracture or focal pathologic process. Paraspinal and other soft tissues: There is gas in the paraspinal soft tissues with a 5.6 cm fluid collection in the right multifidus muscle at the L4-5 level. There is gas in the right iliacus and psoas muscles and right gluteus minimus and medius muscles with expansion of those muscles with low-density consistent with abscess. There is also fluid and abnormal soft tissue in the presacral space extending through the right sciatic notch. There is a tiny bubble of air  in the right hip joint. Cholelithiasis. 5 mm stone in the mid right kidney. Small right inguinal hernia containing only fat. Disc levels: T7-8 through L3-4: No appreciable disc protrusions. The discs at T9-10 and T11-12 are degenerated with vacuum phenomena. L4-5: Small broad-based disc bulge. Slight hypertrophy of the left ligamentum flavum creates narrowing of the spinal canal. The CT thecal sac appears compressed in the transverse dimension. L5-S1: Disc space narrowing asymmetric disc osteophyte complex to the left of midline. There appears to be abnormal soft tissue in the spinal canal at S1. Given the adjacent soft tissue infection, the possibility of infection in the spinal canal should be considered. MRI of the lower abdomen and pelvis and lumbar spine recommended for further evaluation of the findings to determine whether there is epidural extension of infection. IMPRESSION: 1. Extensive soft tissue infection in bulging the right paraspinal muscles, right gluteal muscles, presacral space, and the soft tissues of the right sciatic notch. There is a definable abscess in the right multifidus muscle. 2. Subtle increased soft tissue in the spinal canal at S1. Asymmetric compression of the thecal sac at L4-5. The possibility of infection within the spinal canal should be considered. 3. MRI of the lumbar spine with and without contrast and MRI of the lower abdomen and pelvis with and without contrast recommended for further evaluation. Critical Value/emergent results were called by telephone at the time of interpretation on 07/06/2019 at 8:17 am to the attending ER physician , who verbally acknowledged these results. Electronically Signed   By: Lorriane Shire M.D.   On: 07/06/2019 08:16   Dg Chest Portable 1 View  Result Date: 07/06/2019 CLINICAL DATA:  Shortness of breath. EXAM: PORTABLE CHEST 1 VIEW COMPARISON:  None. FINDINGS: Mild cardiomegaly. Normal mediastinal contours. No focal airspace disease, pleural  effusion, or pneumothorax. No pulmonary edema. No acute osseous abnormalities. IMPRESSION: Mild cardiomegaly. No acute chest findings. Electronically Signed   By: Keith Rake M.D.   On: 07/06/2019 04:10   Vas [place] Lower Extremity Venous (dvt)  Result Date: 07/07/2019  Lower Venous Study Indications: Pain.  Risk Factors: Abscess of spinal  canal, right gluteal muscle and iliopsoas muscle. Comparison Study: No prior study on file Performing Technologist: Sharion Dove RVS  Examination Guidelines: A complete evaluation includes B-mode imaging, spectral Doppler, color Doppler, and power Doppler as needed of all accessible portions of each vessel. Bilateral testing is considered an integral part of a complete examination. Limited examinations for reoccurring indications may be performed as noted.  +---------+---------------+---------+-----------+----------+--------------+  RIGHT     Compressibility Phasicity Spontaneity Properties Thrombus Aging  +---------+---------------+---------+-----------+----------+--------------+  CFV       Full            Yes       Yes                                    +---------+---------------+---------+-----------+----------+--------------+  SFJ       Full                                                             +---------+---------------+---------+-----------+----------+--------------+  FV Prox   Full                                                             +---------+---------------+---------+-----------+----------+--------------+  FV Mid    Full                                                             +---------+---------------+---------+-----------+----------+--------------+  FV Distal Full                                                             +---------+---------------+---------+-----------+----------+--------------+  PFV       Full                                                             +---------+---------------+---------+-----------+----------+--------------+   POP       Full            Yes       Yes                                    +---------+---------------+---------+-----------+----------+--------------+  PTV       Full                                                             +---------+---------------+---------+-----------+----------+--------------+  PERO      Full                                                             +---------+---------------+---------+-----------+----------+--------------+   +----+---------------+---------+-----------+----------+--------------+  LEFT Compressibility Phasicity Spontaneity Properties Thrombus Aging  +----+---------------+---------+-----------+----------+--------------+  CFV  Full            Yes       Yes                                    +----+---------------+---------+-----------+----------+--------------+     Summary: Right: There is no evidence of deep vein thrombosis in the lower extremity. Left: No evidence of common femoral vein obstruction.  *See table(s) above for measurements and observations.    Preliminary    Assessment/Plan:  48yo M with 3 wk history of flank pain found to have paraspinal/psoas and gluteal abscess, small epidural abscess of L3-S1 region with MSSA bacteremia. 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 Would recommend to see if IR going to drain large gluteal abscess (though this is likely connected to paraspinal fluid collection)  would also recommend to get TEE on Tuesday/wednesday - plan on repeat blood cx tomorrow -would wait to place on picc line until document clearance of bacteremia  Left leg swelling= - U/S of the left calf to see if any other abscess/septic thrombophlebitis  Health maintenance = would check  hep C ab

## 2019-07-07 NOTE — Progress Notes (Addendum)
Subjective: Patient reports severe back pain. Painful to touch right lower extremity. Reports weakness in right leg  Objective: Vital signs in last 24 hours: Temp:  [98.5 F (36.9 C)-100.2 F (37.9 C)] 100.2 F (37.9 C) (09/05 0735) Pulse Rate:  [103-144] 103 (09/05 0735) Resp:  [12-27] 25 (09/05 0735) BP: (104-166)/(66-99) 113/69 (09/05 0735) SpO2:  [85 %-98 %] 97 % (09/05 0735) Weight:  [164 kg] 164 kg (09/04 2116)  Intake/Output from previous day: 09/04 0701 - 09/05 0700 In: 1810 [P.O.:960; IV Piggyback:850] Out: 1050 [Urine:1050] Intake/Output this shift: No intake/output data recorded.  Neurologic: Grossly normal, right iliopsoas 3/5,   Lab Results: Lab Results  Component Value Date   WBC 6.7 07/07/2019   HGB 11.1 (L) 07/07/2019   HCT 34.2 (L) 07/07/2019   MCV 92.4 07/07/2019   PLT 197 07/07/2019   No results found for: INR, PROTIME BMET Lab Results  Component Value Date   NA 127 (L) 07/07/2019   K 4.1 07/07/2019   CL 92 (L) 07/07/2019   CO2 24 07/07/2019   GLUCOSE 130 (H) 07/07/2019   BUN 11 07/07/2019   CREATININE 0.64 07/07/2019   CALCIUM 8.2 (L) 07/07/2019    Studies/Results: Mr Lumbar Spine W Wo Contrast  Result Date: 07/06/2019 CLINICAL DATA:  Infection.  Rule out epidural abscess. EXAM: MRI LUMBAR SPINE WITHOUT AND WITH CONTRAST TECHNIQUE: Multiplanar and multiecho pulse sequences of the lumbar spine were obtained without and with intravenous contrast. CONTRAST:  10 mL Gadovist IV COMPARISON:  CT abdomen pelvis in CT lumbar spine 07/06/2019 FINDINGS: Segmentation:  Normal Alignment:  Mild retrolisthesis L1-2 and L4-5 and L5-S1. Vertebrae: Negative for vertebral fracture or mass. No vertebral edema or abnormal enhancement. No evidence of discitis. Conus medullaris and cauda equina: Conus extends to the L1-2 level. Conus and cauda equina appear normal. Paraspinal and other soft tissues: Large enhancing fluid collection in the right paraspinous muscle at the  L4 level measures 5.5 x 6 cm compatible with abscess. Adjacent facet joints show mild degeneration but no definite infection. Multilocular large fluid collection in the right iliopsoas muscle compatible with abscess. This measures at least 5 x 6 cm. Large multilocular fluid collection in the right gluteus muscle measures at least 5 cm. Abscess in the iliopsoas and gluteus muscle appear to communicate through the sciatic notch. Disc levels: L1-2: Disc and facet degeneration. Shallow left-sided disc protrusion with mild subarticular stenosis on the left. L2-3: Negative L3-4: Normal disc space. Small epidural abscess posteriorly on the left. Bilateral facet degeneration without evidence of facet joint infection. L4-5: Mild retrolisthesis. Disc degeneration and facet degeneration. Small extraforaminal disc protrusion on the left. Epidural abscess posteriorly on the left compressing the thecal sac and contributing to moderate spinal stenosis. No evidence of facet joint infection. L5-S1: Disc degeneration with left-sided disc protrusion. Posterior epidural abscess is ill-defined but is contributing to compression of thecal sac. There is enhancement and fluid along the right S1 nerve root which may be the route of spread to the epidural space. No evidence of facet infection. IMPRESSION: 1. Multiple areas of infection. Large areas of abscess formation in the right paraspinous muscle at the L4 level. Large abscess in the right iliopsoas muscle and also in the right gluteus muscle. 2. 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 This may have entered the spinal canal via the right S1 nerve root. No evidence of discitis or facet joint infection. Electronically Signed   By: Franchot Gallo M.D.  On: 07/06/2019 13:34   Mr Pelvis W Wo Contrast  Result Date: 07/06/2019 CLINICAL DATA:  Right-sided low back pain for several weeks. No recent injury. Recent antibiotic therapy for urinary tract infection. EXAM: MRI PELVIS  WITHOUT AND WITH CONTRAST TECHNIQUE: Multiplanar multisequence MR imaging of the pelvis was performed both before and after administration of intravenous contrast. CONTRAST:  10 cc Gadavist COMPARISON:  Abdominopelvic CT 07/06/2019 and 06/18/2019. FINDINGS: Urinary Tract: The visualized distal ureters and bladder appear unremarkable. Bowel: No bowel wall thickening, distention or surrounding inflammation identified within the pelvis. Vascular/Lymphatic: No enlarged pelvic lymph nodes identified. No significant vascular findings. Reproductive: There are areas T2 hyperintensity and decreased enhancement centrally in the prostate gland bilaterally which may be secondary to benign prosthetic hypertrophy or prostatitis. There is no surrounding inflammatory change. The seminal vesicles appear normal. Other: No ascites. Musculoskeletal: As demonstrated on earlier CT scan, there are multiple complex fluid collections surrounding the right sacroiliac joint. These demonstrate internal air bubbles, heterogeneity and irregular peripheral enhancement. Dominant component laterally in the gluteus musculature measures up to 9.8 x 6.1 cm on image 18/13. There is a component in the right issue femoral fossa measuring up to 5.2 cm on image 27/3. This demonstrates inferior extension around the right common hamstring tendon. There are intrapelvic components extending through the greater sciatic foramen, involving the right piriformis muscle and measuring up to 6.3 cm on image 13/12. There are also irregular components within the right iliopsoas muscle measuring up to 3.8 cm on image 9/13. There are right posterior paraspinal components within the erector spinae musculature, measuring up to 6.2 cm on image 1/13. There is mild T2 hyperintensity and enhancement within the right sacroiliac joint, with associated marrow edema and enhancement in the adjacent right sacral ala. T2 hyperintensity and enhancement are also present within the left  proximal common hamstring muscle. There are no focal fluid collections in the left pelvis. No significant hip joint effusion or abnormal synovial enhancement. IMPRESSION: 1. As seen on earlier CT, there are multiple complex fluid collections in the right pelvis with internal air and peripheral enhancement consistent with multifocal abscesses. 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 2. Inflammation extends into the erector spinae musculature on the right. Lumbar spine findings are dictated separately. 3. Mild T2 hyperintensity within the proximal left common hamstring muscle. 4. Central heterogeneity within the prostate gland is removed from these other findings and probably due to BPH. Prostatitis could have this appearance. Electronically Signed   By: Richardean Sale M.D.   On: 07/06/2019 13:32   Ct Abdomen Pelvis W Contrast  Addendum Date: 07/06/2019   ADDENDUM REPORT: 07/06/2019 08:20 ADDENDUM: Original report by Dr. Golden Circle. Addendum by Dr. Jeralyn Ruths for contrast extravasation documentation: Type of contrast:  Isovue 300 Site of extravasation: Right AC Estimated volume of extravasation: 40 ml Area of extravasation scanned with CT? no PATIENT'S SIGNS AND SYMPTOMS Skin blistering/ulceration: no Decrease capillary refill: no Change in skin color: no Decreased motor function or severe tightness: no Decreased pulses distal to site of extravasation: no Altered sensation: no Increasing pain or signs of increased swelling during observation: no TREATMENT An ice pack was applied with instructions given to the patient for limb elevation and warning signs to look for. The patient was returned to the emergency department. Electronically Signed   By: Logan Bores M.D.   On: 07/06/2019 08:20  Result Date: 07/06/2019 CLINICAL DATA:  Right-sided pain for several weeks, initial  encounter EXAM: CT ABDOMEN AND PELVIS WITH CONTRAST TECHNIQUE: Multidetector CT imaging of the abdomen and pelvis was performed using the standard protocol following bolus administration of intravenous contrast. CONTRAST:  152mL ISOVUE-300 IOPAMIDOL (ISOVUE-300) INJECTION 61% COMPARISON:  06/18/2019 FINDINGS: Lower chest: Lung bases are well aerated bilaterally. There is a vague nodular density identified on the first image within the right middle lobe. This area was not covered on the prior exam. Hepatobiliary: Liver is diffusely decreased in attenuation consistent with fatty infiltration. The gallbladder is decompressed. Scattered calcifications are noted within consistent with stones. No obstructive changes are noted. Pancreas: Unremarkable. No pancreatic ductal dilatation or surrounding inflammatory changes. Spleen: Normal in size without focal abnormality. Adrenals/Urinary Tract: Adrenal glands are within normal limits. The kidneys are well visualized with renal calculi stable in appearance from the prior exam. The largest of these lies in the lower pole measuring approximately 9 mm. The left kidney demonstrates no renal calculi. Very mild prominence of the collecting system and ureter on the right are seen similar to that noted on the prior exam without obstructing lesion. The bladder is well distended. Stomach/Bowel: No obstructive or inflammatory changes of the colon are seen. The appendix is not well visualized. No obstructive or inflammatory changes of the small bowel are seen. The stomach is unremarkable. Vascular/Lymphatic: No significant vascular findings are present. No enlarged abdominal or pelvic lymph nodes. Reproductive: Prostate is unremarkable. Other: No abdominal wall hernia or abnormality. No abdominopelvic ascites. Musculoskeletal: There are changes within the right buttock, right iliac fossa and perispinal musculature on the right with fluid collections and air foci identified consistent with  localized infection. The fluid collection within the right paraspinal musculature measures 5.6 x 5.5 cm consistent with a focal abscess. Similar extension into the iliac fossa on the right as well as the musculature of the right buttocks is seen. Soft tissue density in the presacral region and along the right pelvic wall are noted consistent with focal localized infection as well. IMPRESSION: Changes consistent with localized abscess involving the right iliac fossa, musculature of the right buttocks and right paraspinal musculature with extension into the lateral aspect of the pelvis on the right. Percutaneous drainage of the paraspinal collection may be helpful. These changes are all new from the prior exam. Stable renal calculi on the right. Stable cholelithiasis and fatty infiltration of the liver. Electronically Signed: By: Inez Catalina M.D. On: 07/06/2019 07:55   Ct Aspiration  Result Date: 07/06/2019 INDICATION: 48 year old with multiple sites of infection including a lumbar paraspinal muscular abscess. Plan for CT-guided aspiration of the paraspinal abscess collection. EXAM: CT-GUIDED ASPIRATION OF PARASPINAL ABSCESS MEDICATIONS: Moderate sedation ANESTHESIA/SEDATION: Fentanyl 100 mcg IV; Versed 2.0 mg IV Moderate Sedation Time:  10 minutes The patient was continuously monitored during the procedure by the interventional radiology nurse under my direct supervision. COMPLICATIONS: None immediate. PROCEDURE: Informed written consent was obtained from the patient after a thorough discussion of the procedural risks, benefits and alternatives. All questions were addressed. A timeout was performed prior to the initiation of the procedure. Patient was placed prone on the CT scanner. CT images through the lower abdomen were obtained. The right paraspinal muscular abscess was identified at the L4-L5 level. Overlying skin was prepped with chlorhexidine. Sterile field was created. Skin was anesthetized with 1%  lidocaine. Yueh catheter was directed into the abscess collection with CT guidance. Approximately 15 mL of brown purulent fluid was  aspirated. No additional fluid could be aspirated. At this point in the procedure the patient was in severe pain from lying prone and the procedure was stopped. Bandage placed over the puncture site. FINDINGS: Low-density abscess in the right paraspinal musculature at L4-L5. 15 mL of brown purulent fluid was removed from this collection. IMPRESSION: CT-guided aspiration of the right paraspinal abscess at L4-L5. 15 mL of purulent fluid was removed and sent for culture. Electronically Signed   By: Markus Daft M.D.   On: 07/06/2019 19:22   Ct L-spine No Charge  Result Date: 07/06/2019 CLINICAL DATA:  Right-sided back pain and right lower extremity pain for 3 weeks. EXAM: CT LUMBAR SPINE WITHOUT CONTRAST TECHNIQUE: Multidetector CT imaging of the lumbar spine was performed without intravenous contrast administration. Multiplanar CT image reconstructions were also generated. COMPARISON:  None. FINDINGS: Segmentation: 5 lumbar type vertebrae. Alignment: Normal. Vertebrae: No acute fracture or focal pathologic process. Paraspinal and other soft tissues: There is gas in the paraspinal soft tissues with a 5.6 cm fluid collection in the right multifidus muscle at the L4-5 level. There is gas in the right iliacus and psoas muscles and right gluteus minimus and medius muscles with expansion of those muscles with low-density consistent with abscess. There is also fluid and abnormal soft tissue in the presacral space extending through the right sciatic notch. There is a tiny bubble of air in the right hip joint. Cholelithiasis. 5 mm stone in the mid right kidney. Small right inguinal hernia containing only fat. Disc levels: T7-8 through L3-4: No appreciable disc protrusions. The discs at T9-10 and T11-12 are degenerated with vacuum phenomena. L4-5: Small broad-based disc bulge. Slight hypertrophy  of the left ligamentum flavum creates narrowing of the spinal canal. The CT thecal sac appears compressed in the transverse dimension. L5-S1: Disc space narrowing asymmetric disc osteophyte complex to the left of midline. There appears to be abnormal soft tissue in the spinal canal at S1. Given the adjacent soft tissue infection, the possibility of infection in the spinal canal should be considered. MRI of the lower abdomen and pelvis and lumbar spine recommended for further evaluation of the findings to determine whether there is epidural extension of infection. IMPRESSION: 1. Extensive soft tissue infection in bulging the right paraspinal muscles, right gluteal muscles, presacral space, and the soft tissues of the right sciatic notch. There is a definable abscess in the right multifidus muscle. 2. Subtle increased soft tissue in the spinal canal at S1. Asymmetric compression of the thecal sac at L4-5. The possibility of infection within the spinal canal should be considered. 3. MRI of the lumbar spine with and without contrast and MRI of the lower abdomen and pelvis with and without contrast recommended for further evaluation. Critical Value/emergent results were called by telephone at the time of interpretation on 07/06/2019 at 8:17 am to the attending ER physician , who verbally acknowledged these results. Electronically Signed   By: Lorriane Shire M.D.   On: 07/06/2019 08:16   Dg Chest Portable 1 View  Result Date: 07/06/2019 CLINICAL DATA:  Shortness of breath. EXAM: PORTABLE CHEST 1 VIEW COMPARISON:  None. FINDINGS: Mild cardiomegaly. Normal mediastinal contours. No focal airspace disease, pleural effusion, or pneumothorax. No pulmonary edema. No acute osseous abnormalities. IMPRESSION: Mild cardiomegaly. No acute chest findings. Electronically Signed   By: Keith Rake M.D.   On: 07/06/2019 04:10    Assessment/Plan: Patient was taken to IR for aspiration of paraspinal fluid yesterday. Continue abx  regimen and therapies.  Recommend ID consult   LOS: 1 day    Ocie Cornfield Yichen Gilardi 07/07/2019, 8:28 AM

## 2019-07-08 DIAGNOSIS — M5431 Sciatica, right side: Secondary | ICD-10-CM

## 2019-07-08 DIAGNOSIS — G062 Extradural and subdural abscess, unspecified: Secondary | ICD-10-CM

## 2019-07-08 LAB — CBC
HCT: 31.6 % — ABNORMAL LOW (ref 39.0–52.0)
Hemoglobin: 10.7 g/dL — ABNORMAL LOW (ref 13.0–17.0)
MCH: 30.7 pg (ref 26.0–34.0)
MCHC: 33.9 g/dL (ref 30.0–36.0)
MCV: 90.5 fL (ref 80.0–100.0)
Platelets: 172 10*3/uL (ref 150–400)
RBC: 3.49 MIL/uL — ABNORMAL LOW (ref 4.22–5.81)
RDW: 12.3 % (ref 11.5–15.5)
WBC: 6.3 10*3/uL (ref 4.0–10.5)
nRBC: 0 % (ref 0.0–0.2)

## 2019-07-08 LAB — BASIC METABOLIC PANEL
Anion gap: 10 (ref 5–15)
BUN: 7 mg/dL (ref 6–20)
CO2: 27 mmol/L (ref 22–32)
Calcium: 8 mg/dL — ABNORMAL LOW (ref 8.9–10.3)
Chloride: 86 mmol/L — ABNORMAL LOW (ref 98–111)
Creatinine, Ser: 0.7 mg/dL (ref 0.61–1.24)
GFR calc Af Amer: >60 mL/min (ref >60)
GFR calc non Af Amer: >60 mL/min (ref >60)
Glucose, Bld: 307 mg/dL — ABNORMAL HIGH (ref 70–99)
Potassium: 4.2 mmol/L (ref 3.5–5.1)
Sodium: 123 mmol/L — ABNORMAL LOW (ref 135–145)

## 2019-07-08 LAB — CULTURE, BLOOD (ROUTINE X 2): Special Requests: ADEQUATE

## 2019-07-08 LAB — GLUCOSE, CAPILLARY
Glucose-Capillary: 263 mg/dL — ABNORMAL HIGH (ref 70–99)
Glucose-Capillary: 264 mg/dL — ABNORMAL HIGH (ref 70–99)
Glucose-Capillary: 279 mg/dL — ABNORMAL HIGH (ref 70–99)
Glucose-Capillary: 301 mg/dL — ABNORMAL HIGH (ref 70–99)
Glucose-Capillary: 305 mg/dL — ABNORMAL HIGH (ref 70–99)

## 2019-07-08 MED ORDER — INSULIN NPH (HUMAN) (ISOPHANE) 100 UNIT/ML ~~LOC~~ SUSP
15.0000 [IU] | Freq: Two times a day (BID) | SUBCUTANEOUS | Status: DC
  Administered 2019-07-08 – 2019-07-10 (×3): 15 [IU] via SUBCUTANEOUS
  Filled 2019-07-08 (×2): qty 10

## 2019-07-08 MED ORDER — ZOLPIDEM TARTRATE 5 MG PO TABS
5.0000 mg | ORAL_TABLET | Freq: Every evening | ORAL | Status: DC | PRN
  Administered 2019-07-08 – 2019-08-15 (×20): 5 mg via ORAL
  Filled 2019-07-08 (×25): qty 1

## 2019-07-08 NOTE — Consult Note (Signed)
Ortho Trauma Note  Received consult from Dr. Baxter Flattery. 48 yo male with sepsis and bactermia related to multi-focal abscesses. MR and CT imaging shows large lateral gluteal abscess with extension through greater sciatic notch with intrapelvic continuation. There is also abscess around iliospoas and the paraspinal abscess as well. Due to size and location would recommend I&D through posterior approach with +/- lateral window for iliospoas abscess. Will tentatively plan for tomorrow, however with OR staffing because of holiday may be delayed until Tuesday. NPO past midnight with formal orthopaedic consult to follow tonight or tomorrow AM.  Shona Needles, MD [facility] [phone] (phone) [phone] (office) [email or web address]

## 2019-07-08 NOTE — Progress Notes (Signed)
Providing Compassionate, Quality Care - Together   Subjective: Patient reports pain level is slightly better today. He still has extreme pain and paresthesias in BLE.  Objective: Vital signs in last 24 hours: Temp:  [98.3 F (36.8 C)-99.8 F (37.7 C)] 98.6 F (37 C) (09/06 0739) Pulse Rate:  [98-106] 98 (09/06 0739) Resp:  [12-20] 12 (09/06 0739) BP: (110-130)/(62-82) 123/75 (09/06 0739) SpO2:  [96 %-99 %] 96 % (09/06 0739)  Intake/Output from previous day: 09/05 0701 - 09/06 0700 In: 1296 [P.O.:476; I.V.:720; IV Piggyback:100] Out: 3075 [Urine:3075] Intake/Output this shift: No intake/output data recorded.  Alert and oriented x 4 PERRLA CN II-XII grossly intact 3+ pitting edema BLE MAE, RLE extremely limited due to pain. Unable to raise R leg off of bed secondary to pain, difficult to gauge strength. RLE with significant numbness. LLE hip flexor strength 4/5, hyperpathic in L foot  Lab Results: Recent Labs    07/06/19 0359 07/06/19 1738 07/07/19 0521  WBC 10.4  --  6.7  HGB 14.0 12.6* 11.1*  HCT 42.6 37.0* 34.2*  PLT 256  --  197   BMET Recent Labs    07/06/19 2213 07/07/19 0521  NA 132* 127*  K 4.8 4.1  CL 95* 92*  CO2 26 24  GLUCOSE 107* 130*  BUN 14 11  CREATININE 0.75 0.64  CALCIUM 8.4* 8.2*    Studies/Results: Mr Lumbar Spine W Wo Contrast  Result Date: 07/06/2019 CLINICAL DATA:  Infection.  Rule out epidural abscess. EXAM: MRI LUMBAR SPINE WITHOUT AND WITH CONTRAST TECHNIQUE: Multiplanar and multiecho pulse sequences of the lumbar spine were obtained without and with intravenous contrast. CONTRAST:  10 mL Gadovist IV COMPARISON:  CT abdomen pelvis in CT lumbar spine 07/06/2019 FINDINGS: Segmentation:  Normal Alignment:  Mild retrolisthesis L1-2 and L4-5 and L5-S1. Vertebrae: Negative for vertebral fracture or mass. No vertebral edema or abnormal enhancement. No evidence of discitis. Conus medullaris and cauda equina: Conus extends to the L1-2  level. Conus and cauda equina appear normal. Paraspinal and other soft tissues: Large enhancing fluid collection in the right paraspinous muscle at the L4 level measures 5.5 x 6 cm compatible with abscess. Adjacent facet joints show mild degeneration but no definite infection. Multilocular large fluid collection in the right iliopsoas muscle compatible with abscess. This measures at least 5 x 6 cm. Large multilocular fluid collection in the right gluteus muscle measures at least 5 cm. Abscess in the iliopsoas and gluteus muscle appear to communicate through the sciatic notch. Disc levels: L1-2: Disc and facet degeneration. Shallow left-sided disc protrusion with mild subarticular stenosis on the left. L2-3: Negative L3-4: Normal disc space. Small epidural abscess posteriorly on the left. Bilateral facet degeneration without evidence of facet joint infection. L4-5: Mild retrolisthesis. Disc degeneration and facet degeneration. Small extraforaminal disc protrusion on the left. Epidural abscess posteriorly on the left compressing the thecal sac and contributing to moderate spinal stenosis. No evidence of facet joint infection. L5-S1: Disc degeneration with left-sided disc protrusion. Posterior epidural abscess is ill-defined but is contributing to compression of thecal sac. There is enhancement and fluid along the right S1 nerve root which may be the route of spread to the epidural space. No evidence of facet infection. IMPRESSION: 1. Multiple areas of infection. Large areas of abscess formation in the right paraspinous muscle at the L4 level. Large abscess in the right iliopsoas muscle and also in the right gluteus muscle. 2. 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  This may have entered the spinal canal via the right S1 nerve root. No evidence of discitis or facet joint infection. Electronically Signed   By: Franchot Gallo M.D.   On: 07/06/2019 13:34   Mr Pelvis W Wo Contrast  Result Date:  07/06/2019 CLINICAL DATA:  Right-sided low back pain for several weeks. No recent injury. Recent antibiotic therapy for urinary tract infection. EXAM: MRI PELVIS WITHOUT AND WITH CONTRAST TECHNIQUE: Multiplanar multisequence MR imaging of the pelvis was performed both before and after administration of intravenous contrast. CONTRAST:  10 cc Gadavist COMPARISON:  Abdominopelvic CT 07/06/2019 and 06/18/2019. FINDINGS: Urinary Tract: The visualized distal ureters and bladder appear unremarkable. Bowel: No bowel wall thickening, distention or surrounding inflammation identified within the pelvis. Vascular/Lymphatic: No enlarged pelvic lymph nodes identified. No significant vascular findings. Reproductive: There are areas T2 hyperintensity and decreased enhancement centrally in the prostate gland bilaterally which may be secondary to benign prosthetic hypertrophy or prostatitis. There is no surrounding inflammatory change. The seminal vesicles appear normal. Other: No ascites. Musculoskeletal: As demonstrated on earlier CT scan, there are multiple complex fluid collections surrounding the right sacroiliac joint. These demonstrate internal air bubbles, heterogeneity and irregular peripheral enhancement. Dominant component laterally in the gluteus musculature measures up to 9.8 x 6.1 cm on image 18/13. There is a component in the right issue femoral fossa measuring up to 5.2 cm on image 27/3. This demonstrates inferior extension around the right common hamstring tendon. There are intrapelvic components extending through the greater sciatic foramen, involving the right piriformis muscle and measuring up to 6.3 cm on image 13/12. There are also irregular components within the right iliopsoas muscle measuring up to 3.8 cm on image 9/13. There are right posterior paraspinal components within the erector spinae musculature, measuring up to 6.2 cm on image 1/13. There is mild T2 hyperintensity and enhancement within the right  sacroiliac joint, with associated marrow edema and enhancement in the adjacent right sacral ala. T2 hyperintensity and enhancement are also present within the left proximal common hamstring muscle. There are no focal fluid collections in the left pelvis. No significant hip joint effusion or abnormal synovial enhancement. IMPRESSION: 1. As seen on earlier CT, there are multiple complex fluid collections in the right pelvis with internal air and peripheral enhancement consistent with multifocal abscesses. 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 2. Inflammation extends into the erector spinae musculature on the right. Lumbar spine findings are dictated separately. 3. Mild T2 hyperintensity within the proximal left common hamstring muscle. 4. Central heterogeneity within the prostate gland is removed from these other findings and probably due to BPH. Prostatitis could have this appearance. Electronically Signed   By: Richardean Sale M.D.   On: 07/06/2019 13:32   Ct Aspiration  Result Date: 07/06/2019 INDICATION: 48 year old with multiple sites of infection including a lumbar paraspinal muscular abscess. Plan for CT-guided aspiration of the paraspinal abscess collection. EXAM: CT-GUIDED ASPIRATION OF PARASPINAL ABSCESS MEDICATIONS: Moderate sedation ANESTHESIA/SEDATION: Fentanyl 100 mcg IV; Versed 2.0 mg IV Moderate Sedation Time:  10 minutes The patient was continuously monitored during the procedure by the interventional radiology nurse under my direct supervision. COMPLICATIONS: None immediate. PROCEDURE: Informed written consent was obtained from the patient after a thorough discussion of the procedural risks, benefits and alternatives. All questions were addressed. A timeout was performed prior to the initiation of the procedure. Patient  was placed prone on the CT  scanner. CT images through the lower abdomen were obtained. The right paraspinal muscular abscess was identified at the L4-L5 level. Overlying skin was prepped with chlorhexidine. Sterile field was created. Skin was anesthetized with 1% lidocaine. Yueh catheter was directed into the abscess collection with CT guidance. Approximately 15 mL of brown purulent fluid was aspirated. No additional fluid could be aspirated. At this point in the procedure the patient was in severe pain from lying prone and the procedure was stopped. Bandage placed over the puncture site. FINDINGS: Low-density abscess in the right paraspinal musculature at L4-L5. 15 mL of brown purulent fluid was removed from this collection. IMPRESSION: CT-guided aspiration of the right paraspinal abscess at L4-L5. 15 mL of purulent fluid was removed and sent for culture. Electronically Signed   By: Markus Daft M.D.   On: 07/06/2019 19:22   Vas [place] Lower Extremity Venous (dvt)  Result Date: 07/07/2019  Lower Venous Study Indications: Pain.  Risk Factors: Abscess of spinal canal, right gluteal muscle and iliopsoas muscle. Comparison Study: No prior study on file Performing Technologist: Sharion Dove RVS  Examination Guidelines: A complete evaluation includes B-mode imaging, spectral Doppler, color Doppler, and power Doppler as needed of all accessible portions of each vessel. Bilateral testing is considered an integral part of a complete examination. Limited examinations for reoccurring indications may be performed as noted.  +---------+---------------+---------+-----------+----------+--------------+  RIGHT     Compressibility Phasicity Spontaneity Properties Thrombus Aging  +---------+---------------+---------+-----------+----------+--------------+  CFV       Full            Yes       Yes                                    +---------+---------------+---------+-----------+----------+--------------+  SFJ       Full                                                              +---------+---------------+---------+-----------+----------+--------------+  FV Prox   Full                                                             +---------+---------------+---------+-----------+----------+--------------+  FV Mid    Full                                                             +---------+---------------+---------+-----------+----------+--------------+  FV Distal Full                                                             +---------+---------------+---------+-----------+----------+--------------+  PFV       Full                                                             +---------+---------------+---------+-----------+----------+--------------+  POP       Full            Yes       Yes                                    +---------+---------------+---------+-----------+----------+--------------+  PTV       Full                                                             +---------+---------------+---------+-----------+----------+--------------+  PERO      Full                                                             +---------+---------------+---------+-----------+----------+--------------+   +----+---------------+---------+-----------+----------+--------------+  LEFT Compressibility Phasicity Spontaneity Properties Thrombus Aging  +----+---------------+---------+-----------+----------+--------------+  CFV  Full            Yes       Yes                                    +----+---------------+---------+-----------+----------+--------------+     Summary: Right: There is no evidence of deep vein thrombosis in the lower extremity. Left: No evidence of common femoral vein obstruction.  *See table(s) above for measurements and observations.    Preliminary     Assessment/Plan: Patient admitted on 07/06/2019 with a large paraspinal abscess with extension towards the right L4-5 facet joint. There is a small amount of epidural fluid left of midline with a mild to moderate  amount of stenosis. IR performed a CT guided aspiration of right paraspinal abscess. Surgical decompression not recommended per Dr. Saintclair Halsted. ID recommended cefazolin for MSSA.   LOS: 2 days    -Continue antibiotic regimen recommended by ID   Viona Gilmore, DNP, AGNP-C Nurse Practitioner  [facility] [place] [address], [place], [place] [address] P: [phone]     F: [phone]  07/08/2019, 9:19 AM

## 2019-07-08 NOTE — Consult Note (Signed)
Orthopaedic Trauma Service (OTS) Consult   Patient ID: Bradley Cox MRN: [id number] DOB/AGE: 1971/07/01 48 y.o.  Reason for Consult:Right hip abscess Referring Physician: Dr. Carlyle Basques, MD Infectious Disease  HPI: Bradley Cox is an 48 y.o. male who is being seen in consultation at the request of Dr. Baxter Flattery for evaluation of right hip abscess.  This is a patient with a history of obesity asthma, hypertension, diabetes significant low back pain with associated weakness and numbness in his right lower extremity over the last 2 to 3 weeks.  He presented last Friday on September 4 with significant worsening in his pain in his right lower extremity and limited ambulation.  He also had a temperature of 102.8.  He was also tachycardic and tachypneic.  He had elevated leukocytosis with a blood glucose of 524.  He had a CT scan and MRI which was performed which showed epidural abscess, paraspinal abscess and a large multiloculated gluteal abscess that communicated with the pelvis.  He had aspiration performed IR.  Was growing MSSA.  Neurosurgery evaluated for of his epidural abscess and they felt that his lower extremity symptoms were related to his abscess in his iliopsoas as well as his hip abscess and orthopedics was consulted.  Patient was seen on 4 N. progressive.  Currently complaining of severe pain in his hip and lower extremity.  He is unable to rest and is not able to sleep for the last few nights.  He denies any significant fevers currently.  Does note numbness and tingling to his whole foot.  He does note weakness to his foot as well and his inability to straighten his leg secondary to severe pain.  It improves with pain medication.  It also improves with not moving his leg.  Past Medical History:  Diagnosis Date  . Arthritis   . Asthma   . Diabetes mellitus, type 2 ([place])   . Hypertension   . Morbid obesity ([place])   . OSA (obstructive sleep apnea)     Past Surgical History:   Procedure Laterality Date  . HERNIA REPAIR     As a baby    Family History  Problem Relation Age of Onset  . Diabetes Paternal Grandmother     Social History:  reports that he has never smoked. His smokeless tobacco use includes snuff. He reports current alcohol use. He reports that he does not use drugs.  Allergies:  Allergies  Allergen Reactions  . Penicillins Anaphylaxis    Did it involve swelling of the face/tongue/throat, SOB, or low BP? Yes Did it involve sudden or severe rash/hives, skin peeling, or any reaction on the inside of your mouth or nose? No Did you need to seek medical attention at a hospital or doctor's office? Yes When did it last happen?4 months old If all above answers are "NO", may proceed with cephalosporin use.   . Tramadol Anaphylaxis    Medications:  No current facility-administered medications on file prior to encounter.    Current Outpatient Medications on File Prior to Encounter  Medication Sig Dispense Refill  . albuterol (VENTOLIN HFA) 108 (90 Base) MCG/ACT inhaler Inhale 1 puff into the lungs every 6 (six) hours as needed for wheezing or shortness of breath.     . hydrALAZINE (APRESOLINE) 50 MG tablet Take 50 mg by mouth 2 (two) times daily.    [name] Kitchen ibuprofen (ADVIL) 200 MG tablet Take 600 mg by mouth every 6 (six) hours as needed for headache or moderate pain.    [name] Kitchen  metFORMIN (GLUCOPHAGE) 500 MG tablet Take 1 tablet (500 mg total) by mouth 2 (two) times daily with a meal. 60 tablet 0  . Multiple Vitamin (THERA) TABS Take 2 tablets by mouth daily.    . blood glucose meter kit and supplies KIT Dispense based on patient and insurance preference. Use up to four times daily as directed. (FOR ICD-9 250.00, 250.01). 1 each 0    ROS: 10 system review of systems is negative other than noted HPI above.  Exam: Blood pressure 122/76, pulse 94, temperature 98.3 F (36.8 C), temperature source Oral, resp. rate 16, height _0  (1.981 m), weight (!) 164 kg,  SpO2 96 %. General: No acute distress Orientation: Awake alert and oriented x3 Mood and Affect: Cooperative and pleasant Gait: Unable to ambulate secondary to severe pain Coordination and balance: Within normal limits  Right lower extremity: Reveals no obvious skin lesions.  No redness around the hip.  His leg is notably swollen with 2+ pitting edema.  Unable to tolerate any range of motion of the hip.  Unable to straighten or bend his knee secondary to pain.  Does note diminished sensation to his lower extremity and global distribution.  He does act active dorsiflexion and plantarflexion although strength testing was limited secondary to pain.  He has a warm well-perfused foot.  No significant lymphadenopathy but pitting edema is noted.  His reflexes are neutral.  Left lower extremity: Skin without lesions. No tenderness to palpation. Full painless ROM, full strength in each muscle groups without evidence of instability.   Medical Decision Making: Data: Imaging: MR and CT imaging shows large lateral gluteal abscess with extension through greater sciatic notch with intrapelvic continuation. There is also abscess around iliospoas and the paraspinal abscess as well.  Labs:  Results for orders placed or performed during the hospital encounter of 07/05/19 (from the past 48 hour(s))  Glucose, capillary     Status: Abnormal   Collection Time: 07/06/19 11:56 PM  Result Value Ref Range   Glucose-Capillary 161 (H) 70 - 99 mg/dL  Glucose, capillary     Status: Abnormal   Collection Time: 07/07/19  1:00 AM  Result Value Ref Range   Glucose-Capillary 202 (H) 70 - 99 mg/dL  Glucose, capillary     Status: Abnormal   Collection Time: 07/07/19  1:59 AM  Result Value Ref Range   Glucose-Capillary 159 (H) 70 - 99 mg/dL  Glucose, capillary     Status: Abnormal   Collection Time: 07/07/19  3:09 AM  Result Value Ref Range   Glucose-Capillary 123 (H) 70 - 99 mg/dL  Glucose, capillary     Status: Abnormal    Collection Time: 07/07/19  4:01 AM  Result Value Ref Range   Glucose-Capillary 105 (H) 70 - 99 mg/dL  Glucose, capillary     Status: Abnormal   Collection Time: 07/07/19  4:32 AM  Result Value Ref Range   Glucose-Capillary 112 (H) 70 - 99 mg/dL  Basic metabolic panel     Status: Abnormal   Collection Time: 07/07/19  5:21 AM  Result Value Ref Range   Sodium 127 (L) 135 - 145 mmol/L   Potassium 4.1 3.5 - 5.1 mmol/L   Chloride 92 (L) 98 - 111 mmol/L   CO2 24 22 - 32 mmol/L   Glucose, Bld 130 (H) 70 - 99 mg/dL   BUN 11 6 - 20 mg/dL   Creatinine, Ser 0.64 0.61 - 1.24 mg/dL   Calcium 8.2 (L) 8.9 - 10.3  mg/dL   GFR calc non Af Amer >60 >60 mL/min   GFR calc Af Amer >60 >60 mL/min   Anion gap 11 5 - 15    Comment: Performed at [facility] [address] [place], [place] [address]  CBC     Status: Abnormal   Collection Time: 07/07/19  5:21 AM  Result Value Ref Range   WBC 6.7 4.0 - 10.5 K/uL   RBC 3.70 (L) 4.22 - 5.81 MIL/uL   Hemoglobin 11.1 (L) 13.0 - 17.0 g/dL   HCT 34.2 (L) 39.0 - 52.0 %   MCV 92.4 80.0 - 100.0 fL   MCH 30.0 26.0 - 34.0 pg   MCHC 32.5 30.0 - 36.0 g/dL   RDW 12.6 11.5 - 15.5 %   Platelets 197 150 - 400 K/uL   nRBC 0.0 0.0 - 0.2 %    Comment: Performed at [facility], [place] [address], [place], [place] [address]  Glucose, capillary     Status: Abnormal   Collection Time: 07/07/19  5:30 AM  Result Value Ref Range   Glucose-Capillary 135 (H) 70 - 99 mg/dL  Glucose, capillary     Status: Abnormal   Collection Time: 07/07/19  6:44 AM  Result Value Ref Range   Glucose-Capillary 141 (H) 70 - 99 mg/dL  Glucose, capillary     Status: Abnormal   Collection Time: 07/07/19  7:33 AM  Result Value Ref Range   Glucose-Capillary 153 (H) 70 - 99 mg/dL  Hemoglobin A1c     Status: Abnormal   Collection Time: 07/07/19  9:00 AM  Result Value Ref Range   Hgb A1c MFr Bld 12.6 (H) 4.8 - 5.6 %    Comment: (NOTE) Pre diabetes:          5.7%-6.4% Diabetes:               >6.4% Glycemic control for   <7.0% adults with diabetes    Mean Plasma Glucose 314.92 mg/dL    Comment: Performed at [facility], [place] [address], [place], [place] [address]  Glucose, capillary     Status: Abnormal   Collection Time: 07/07/19  9:31 AM  Result Value Ref Range   Glucose-Capillary 133 (H) 70 - 99 mg/dL  Glucose, capillary     Status: Abnormal   Collection Time: 07/07/19 10:34 AM  Result Value Ref Range   Glucose-Capillary 143 (H) 70 - 99 mg/dL  Glucose, capillary     Status: Abnormal   Collection Time: 07/07/19 12:07 PM  Result Value Ref Range   Glucose-Capillary 170 (H) 70 - 99 mg/dL  Glucose, capillary     Status: Abnormal   Collection Time: 07/07/19  5:14 PM  Result Value Ref Range   Glucose-Capillary 207 (H) 70 - 99 mg/dL  Glucose, capillary     Status: Abnormal   Collection Time: 07/07/19 10:00 PM  Result Value Ref Range   Glucose-Capillary 238 (H) 70 - 99 mg/dL   Comment 1 Notify RN    Comment 2 Document in Chart   Glucose, capillary     Status: Abnormal   Collection Time: 07/08/19  7:37 AM  Result Value Ref Range   Glucose-Capillary 264 (H) 70 - 99 mg/dL  CBC     Status: Abnormal   Collection Time: 07/08/19 10:19 AM  Result Value Ref Range   WBC 6.3 4.0 - 10.5 K/uL   RBC 3.49 (L) 4.22 - 5.81 MIL/uL   Hemoglobin 10.7 (L)  13.0 - 17.0 g/dL   HCT 31.6 (L) 39.0 - 52.0 %   MCV 90.5 80.0 - 100.0 fL   MCH 30.7 26.0 - 34.0 pg   MCHC 33.9 30.0 - 36.0 g/dL   RDW 12.3 11.5 - 15.5 %   Platelets 172 150 - 400 K/uL   nRBC 0.0 0.0 - 0.2 %    Comment: Performed at [facility], [place] [address], [place], [place] [address]  Basic metabolic panel     Status: Abnormal   Collection Time: 07/08/19 10:19 AM  Result Value Ref Range   Sodium 123 (L) 135 - 145 mmol/L   Potassium 4.2 3.5 - 5.1 mmol/L   Chloride 86 (L) 98 - 111 mmol/L   CO2 27 22 - 32 mmol/L   Glucose, Bld 307 (H) 70 - 99 mg/dL   BUN 7 6 - 20 mg/dL   Creatinine, Ser 0.70 0.61 - 1.24  mg/dL   Calcium 8.0 (L) 8.9 - 10.3 mg/dL   GFR calc non Af Amer >60 >60 mL/min   GFR calc Af Amer >60 >60 mL/min   Anion gap 10 5 - 15    Comment: Performed at [facility], [place] [address] [place], [place] [address]  Glucose, capillary     Status: Abnormal   Collection Time: 07/08/19 12:12 PM  Result Value Ref Range   Glucose-Capillary 305 (H) 70 - 99 mg/dL  Glucose, capillary     Status: Abnormal   Collection Time: 07/08/19  5:02 PM  Result Value Ref Range   Glucose-Capillary 301 (H) 70 - 99 mg/dL  Glucose, capillary     Status: Abnormal   Collection Time: 07/08/19  9:14 PM  Result Value Ref Range   Glucose-Capillary 279 (H) 70 - 99 mg/dL   Comment 1 Notify RN    Comment 2 Document in Chart   Glucose, capillary     Status: Abnormal   Collection Time: 07/08/19 11:46 PM  Result Value Ref Range   Glucose-Capillary 263 (H) 70 - 99 mg/dL   Comment 1 Notify RN    Comment 2 Document in Chart     Imaging or Labs ordered: No new imaging ordered  Medical history and chart was reviewed and case discussed with medical provider.  Assessment/Plan: 47 year old male with a history of obesity, hypertension and newly diagnosed diabetes with a large right hip abscess with continued pain and neurologic symptoms.  I recommend proceeding with I&D through posterior approach with a possible lateral window for the iliopsoas abscess.  I feel that his symptoms are likely related to irritation of the sciatic nerve.  Risks and benefits were discussed with the patient.  He agrees to proceed with surgery.  We will tentatively plan for tomorrow.  However it being a holiday there is a possibility if more urgent cases or emergent cases come in that he would have to be postponed until Tuesday.  He is having difficulty sleeping secondary to pain I have started him on Ambien.  Continue n.p.o. after midnight.  Shona Needles, MD [facility] [phone] (phone) [phone]  (office) [email or web address]

## 2019-07-08 NOTE — H&P (View-Only) (Signed)
Orthopaedic Trauma Service (OTS) Consult   Patient ID: Bradley Cox MRN: [id number] DOB/AGE: 06/23/1971 47 y.o.  Reason for Consult:Right hip abscess Referring Physician: Dr. Cynthia Snider, MD Infectious Disease  HPI: Bradley Cox is an 47 y.o. male who is being seen in consultation at the request of Dr. Snider for evaluation of right hip abscess.  This is a patient with a history of obesity asthma, hypertension, diabetes significant low back pain with associated weakness and numbness in his right lower extremity over the last 2 to 3 weeks.  He presented last Friday on September 4 with significant worsening in his pain in his right lower extremity and limited ambulation.  He also had a temperature of 102.8.  He was also tachycardic and tachypneic.  He had elevated leukocytosis with a blood glucose of 524.  He had a CT scan and MRI which was performed which showed epidural abscess, paraspinal abscess and a large multiloculated gluteal abscess that communicated with the pelvis.  He had aspiration performed IR.  Was growing MSSA.  Neurosurgery evaluated for of his epidural abscess and they felt that his lower extremity symptoms were related to his abscess in his iliopsoas as well as his hip abscess and orthopedics was consulted.  Patient was seen on 4 N. progressive.  Currently complaining of severe pain in his hip and lower extremity.  He is unable to rest and is not able to sleep for the last few nights.  He denies any significant fevers currently.  Does note numbness and tingling to his whole foot.  He does note weakness to his foot as well and his inability to straighten his leg secondary to severe pain.  It improves with pain medication.  It also improves with not moving his leg.  Past Medical History:  Diagnosis Date  . Arthritis   . Asthma   . Diabetes mellitus, type 2 (HCC)   . Hypertension   . Morbid obesity (HCC)   . OSA (obstructive sleep apnea)     Past Surgical History:   Procedure Laterality Date  . HERNIA REPAIR     As a baby    Family History  Problem Relation Age of Onset  . Diabetes Paternal Grandmother     Social History:  reports that he has never smoked. His smokeless tobacco use includes snuff. He reports current alcohol use. He reports that he does not use drugs.  Allergies:  Allergies  Allergen Reactions  . Penicillins Anaphylaxis    Did it involve swelling of the face/tongue/throat, SOB, or low BP? Yes Did it involve sudden or severe rash/hives, skin peeling, or any reaction on the inside of your mouth or nose? No Did you need to seek medical attention at a hospital or doctor's office? Yes When did it last happen?4 months old If all above answers are "NO", may proceed with cephalosporin use.   . Tramadol Anaphylaxis    Medications:  No current facility-administered medications on file prior to encounter.    Current Outpatient Medications on File Prior to Encounter  Medication Sig Dispense Refill  . albuterol (VENTOLIN HFA) 108 (90 Base) MCG/ACT inhaler Inhale 1 puff into the lungs every 6 (six) hours as needed for wheezing or shortness of breath.     . hydrALAZINE (APRESOLINE) 50 MG tablet Take 50 mg by mouth 2 (two) times daily.    . ibuprofen (ADVIL) 200 MG tablet Take 600 mg by mouth every 6 (six) hours as needed for headache or moderate pain.    .   metFORMIN (GLUCOPHAGE) 500 MG tablet Take 1 tablet (500 mg total) by mouth 2 (two) times daily with a meal. 60 tablet 0  . Multiple Vitamin (THERA) TABS Take 2 tablets by mouth daily.    . blood glucose meter kit and supplies KIT Dispense based on patient and insurance preference. Use up to four times daily as directed. (FOR ICD-9 250.00, 250.01). 1 each 0    ROS: 10 system review of systems is negative other than noted HPI above.  Exam: Blood pressure 122/76, pulse 94, temperature 98.3 F (36.8 C), temperature source Oral, resp. rate 16, height 6' 6" (1.981 m), weight (!) 164 kg,  SpO2 96 %. General: No acute distress Orientation: Awake alert and oriented x3 Mood and Affect: Cooperative and pleasant Gait: Unable to ambulate secondary to severe pain Coordination and balance: Within normal limits  Right lower extremity: Reveals no obvious skin lesions.  No redness around the hip.  His leg is notably swollen with 2+ pitting edema.  Unable to tolerate any range of motion of the hip.  Unable to straighten or bend his knee secondary to pain.  Does note diminished sensation to his lower extremity and global distribution.  He does act active dorsiflexion and plantarflexion although strength testing was limited secondary to pain.  He has a warm well-perfused foot.  No significant lymphadenopathy but pitting edema is noted.  His reflexes are neutral.  Left lower extremity: Skin without lesions. No tenderness to palpation. Full painless ROM, full strength in each muscle groups without evidence of instability.   Medical Decision Making: Data: Imaging: MR and CT imaging shows large lateral gluteal abscess with extension through greater sciatic notch with intrapelvic continuation. There is also abscess around iliospoas and the paraspinal abscess as well.  Labs:  Results for orders placed or performed during the hospital encounter of 07/05/19 (from the past 48 hour(s))  Glucose, capillary     Status: Abnormal   Collection Time: 07/06/19 11:56 PM  Result Value Ref Range   Glucose-Capillary 161 (H) 70 - 99 mg/dL  Glucose, capillary     Status: Abnormal   Collection Time: 07/07/19  1:00 AM  Result Value Ref Range   Glucose-Capillary 202 (H) 70 - 99 mg/dL  Glucose, capillary     Status: Abnormal   Collection Time: 07/07/19  1:59 AM  Result Value Ref Range   Glucose-Capillary 159 (H) 70 - 99 mg/dL  Glucose, capillary     Status: Abnormal   Collection Time: 07/07/19  3:09 AM  Result Value Ref Range   Glucose-Capillary 123 (H) 70 - 99 mg/dL  Glucose, capillary     Status: Abnormal    Collection Time: 07/07/19  4:01 AM  Result Value Ref Range   Glucose-Capillary 105 (H) 70 - 99 mg/dL  Glucose, capillary     Status: Abnormal   Collection Time: 07/07/19  4:32 AM  Result Value Ref Range   Glucose-Capillary 112 (H) 70 - 99 mg/dL  Basic metabolic panel     Status: Abnormal   Collection Time: 07/07/19  5:21 AM  Result Value Ref Range   Sodium 127 (L) 135 - 145 mmol/L   Potassium 4.1 3.5 - 5.1 mmol/L   Chloride 92 (L) 98 - 111 mmol/L   CO2 24 22 - 32 mmol/L   Glucose, Bld 130 (H) 70 - 99 mg/dL   BUN 11 6 - 20 mg/dL   Creatinine, Ser 0.64 0.61 - 1.24 mg/dL   Calcium 8.2 (L) 8.9 - 10.3   mg/dL   GFR calc non Af Amer >60 >60 mL/min   GFR calc Af Amer >60 >60 mL/min   Anion gap 11 5 - 15    Comment: Performed at [facility], [address] [place], [place] [address]  CBC     Status: Abnormal   Collection Time: 07/07/19  5:21 AM  Result Value Ref Range   WBC 6.7 4.0 - 10.5 K/uL   RBC 3.70 (L) 4.22 - 5.81 MIL/uL   Hemoglobin 11.1 (L) 13.0 - 17.0 g/dL   HCT 34.2 (L) 39.0 - 52.0 %   MCV 92.4 80.0 - 100.0 fL   MCH 30.0 26.0 - 34.0 pg   MCHC 32.5 30.0 - 36.0 g/dL   RDW 12.6 11.5 - 15.5 %   Platelets 197 150 - 400 K/uL   nRBC 0.0 0.0 - 0.2 %    Comment: Performed at [facility], [address] [place], [place] [address]  Glucose, capillary     Status: Abnormal   Collection Time: 07/07/19  5:30 AM  Result Value Ref Range   Glucose-Capillary 135 (H) 70 - 99 mg/dL  Glucose, capillary     Status: Abnormal   Collection Time: 07/07/19  6:44 AM  Result Value Ref Range   Glucose-Capillary 141 (H) 70 - 99 mg/dL  Glucose, capillary     Status: Abnormal   Collection Time: 07/07/19  7:33 AM  Result Value Ref Range   Glucose-Capillary 153 (H) 70 - 99 mg/dL  Hemoglobin A1c     Status: Abnormal   Collection Time: 07/07/19  9:00 AM  Result Value Ref Range   Hgb A1c MFr Bld 12.6 (H) 4.8 - 5.6 %    Comment: (NOTE) Pre diabetes:          5.7%-6.4% Diabetes:               >6.4% Glycemic control for   <7.0% adults with diabetes    Mean Plasma Glucose 314.92 mg/dL    Comment: Performed at [facility], [address] [place], [place] [address]  Glucose, capillary     Status: Abnormal   Collection Time: 07/07/19  9:31 AM  Result Value Ref Range   Glucose-Capillary 133 (H) 70 - 99 mg/dL  Glucose, capillary     Status: Abnormal   Collection Time: 07/07/19 10:34 AM  Result Value Ref Range   Glucose-Capillary 143 (H) 70 - 99 mg/dL  Glucose, capillary     Status: Abnormal   Collection Time: 07/07/19 12:07 PM  Result Value Ref Range   Glucose-Capillary 170 (H) 70 - 99 mg/dL  Glucose, capillary     Status: Abnormal   Collection Time: 07/07/19  5:14 PM  Result Value Ref Range   Glucose-Capillary 207 (H) 70 - 99 mg/dL  Glucose, capillary     Status: Abnormal   Collection Time: 07/07/19 10:00 PM  Result Value Ref Range   Glucose-Capillary 238 (H) 70 - 99 mg/dL   Comment 1 Notify RN    Comment 2 Document in Chart   Glucose, capillary     Status: Abnormal   Collection Time: 07/08/19  7:37 AM  Result Value Ref Range   Glucose-Capillary 264 (H) 70 - 99 mg/dL  CBC     Status: Abnormal   Collection Time: 07/08/19 10:19 AM  Result Value Ref Range   WBC 6.3 4.0 - 10.5 K/uL   RBC 3.49 (L) 4.22 - 5.81 MIL/uL   Hemoglobin 10.7 (L)   13.0 - 17.0 g/dL   HCT 31.6 (L) 39.0 - 52.0 %   MCV 90.5 80.0 - 100.0 fL   MCH 30.7 26.0 - 34.0 pg   MCHC 33.9 30.0 - 36.0 g/dL   RDW 12.3 11.5 - 15.5 %   Platelets 172 150 - 400 K/uL   nRBC 0.0 0.0 - 0.2 %    Comment: Performed at [facility], [address] [place], [place] [address]  Basic metabolic panel     Status: Abnormal   Collection Time: 07/08/19 10:19 AM  Result Value Ref Range   Sodium 123 (L) 135 - 145 mmol/L   Potassium 4.2 3.5 - 5.1 mmol/L   Chloride 86 (L) 98 - 111 mmol/L   CO2 27 22 - 32 mmol/L   Glucose, Bld 307 (H) 70 - 99 mg/dL   BUN 7 6 - 20 mg/dL   Creatinine, Ser 0.70 0.61 - 1.24  mg/dL   Calcium 8.0 (L) 8.9 - 10.3 mg/dL   GFR calc non Af Amer >60 >60 mL/min   GFR calc Af Amer >60 >60 mL/min   Anion gap 10 5 - 15    Comment: Performed at [facility], [address] [place], [place] [address]  Glucose, capillary     Status: Abnormal   Collection Time: 07/08/19 12:12 PM  Result Value Ref Range   Glucose-Capillary 305 (H) 70 - 99 mg/dL  Glucose, capillary     Status: Abnormal   Collection Time: 07/08/19  5:02 PM  Result Value Ref Range   Glucose-Capillary 301 (H) 70 - 99 mg/dL  Glucose, capillary     Status: Abnormal   Collection Time: 07/08/19  9:14 PM  Result Value Ref Range   Glucose-Capillary 279 (H) 70 - 99 mg/dL   Comment 1 Notify RN    Comment 2 Document in Chart   Glucose, capillary     Status: Abnormal   Collection Time: 07/08/19 11:46 PM  Result Value Ref Range   Glucose-Capillary 263 (H) 70 - 99 mg/dL   Comment 1 Notify RN    Comment 2 Document in Chart     Imaging or Labs ordered: No new imaging ordered  Medical history and chart was reviewed and case discussed with medical provider.  Assessment/Plan: 47-year-old male with a history of obesity, hypertension and newly diagnosed diabetes with a large right hip abscess with continued pain and neurologic symptoms.  I recommend proceeding with I&D through posterior approach with a possible lateral window for the iliopsoas abscess.  I feel that his symptoms are likely related to irritation of the sciatic nerve.  Risks and benefits were discussed with the patient.  He agrees to proceed with surgery.  We will tentatively plan for tomorrow.  However it being a holiday there is a possibility if more urgent cases or emergent cases come in that he would have to be postponed until Tuesday.  He is having difficulty sleeping secondary to pain I have started him on Ambien.  Continue n.p.o. after midnight.  Reita Shindler P. Rajvi Armentor, MD [facility] [phone] (phone) [phone]  (office) [email or web address]  

## 2019-07-08 NOTE — Progress Notes (Addendum)
[facility] for Infectious Disease    Date of Admission:  07/05/2019   Total days of antibiotics 3   ID: Bradley Cox is a 48 y.o. male with MSSA disseminated infection, bacteremia, paraspinal-gluteal-pelvic abscess with lumbar-sacral epidural abscess. Main complaint of right leg pain and weakness likely due to inflammation about right sciatica n. Principal Problem:   Sepsis ([place]) Active Problems:   Obesity, Class III, BMI 40-49.9 (morbid obesity) (HCC)   Essential hypertension   Abscess   AKI (acute kidney injury) (HCC)   Hyperkalemia    Subjective: Afebrile but pain in right leg. Feels slightly better than yesterday. Noticing LE swelling bilaterally  Medications:  . enoxaparin (LOVENOX) injection  0.5 mg/kg Subcutaneous Q24H  . gabapentin  300 mg Oral BID  . hydrALAZINE  25 mg Oral BID  . insulin aspart  0-15 Units Subcutaneous TID WC  . insulin NPH Human  12 Units Subcutaneous BID AC & HS  . sodium chloride flush  3 mL Intravenous Q12H    Objective: Vital signs in last 24 hours: Temp:  [98.3 F (36.8 C)-99 F (37.2 C)] 98.6 F (37 C) (09/06 1100) Pulse Rate:  [94-106] 94 (09/06 1100) Resp:  [12-20] 17 (09/06 1100) BP: (110-130)/(62-80) 122/80 (09/06 1100) SpO2:  [93 %-99 %] 93 % (09/06 1100) Physical Exam  Constitutional: He is oriented to person, place, and time. He appears well-developed and well-nourished. No distress.  HENT:  Mouth/Throat: Oropharynx is clear and moist. No oropharyngeal exudate.  Cardiovascular: Normal rate, regular rhythm and normal heart sounds. Exam reveals no gallop and no friction rub.  No murmur heard.  Pulmonary/Chest: Effort normal and breath sounds normal. No respiratory distress. He has no wheezes.  Abdominal: Soft. Bowel sounds are normal. He exhibits no distension. There is no tenderness.  Lymphadenopathy:  He has no cervical adenopathy.  Neurological: He is alert and oriented to person, place, and time.  Skin: Skin is  warm and dry. No rash noted. No erythema.  Ext: BLE swelling Psychiatric: He has a normal mood and affect. His behavior is normal.    Lab Results Recent Labs    07/07/19 0521 07/08/19 1019  WBC 6.7 6.3  HGB 11.1* 10.7*  HCT 34.2* 31.6*  NA 127* 123*  K 4.1 4.2  CL 92* 86*  CO2 24 27  BUN 11 7  CREATININE 0.64 0.70   Liver Panel Recent Labs    07/06/19 0526  PROT 7.7  ALBUMIN 1.9*  AST 25  ALT 19  ALKPHOS 84  BILITOT 1.0   Sedimentation Rate Recent Labs    07/06/19 2213  ESRSEDRATE 120*   C-Reactive Protein Recent Labs    07/06/19 2213  CRP 43.5*    Microbiology: 9/4 abscess MSSA 9/4 blood cx MSSA Studies/Results: Ct Aspiration  Result Date: 07/06/2019 INDICATION: 48 year old with multiple sites of infection including a lumbar paraspinal muscular abscess. Plan for CT-guided aspiration of the paraspinal abscess collection. EXAM: CT-GUIDED ASPIRATION OF PARASPINAL ABSCESS MEDICATIONS: Moderate sedation ANESTHESIA/SEDATION: Fentanyl 100 mcg IV; Versed 2.0 mg IV Moderate Sedation Time:  10 minutes The patient was continuously monitored during the procedure by the interventional radiology nurse under my direct supervision. COMPLICATIONS: None immediate. PROCEDURE: Informed written consent was obtained from the patient after a thorough discussion of the procedural risks, benefits and alternatives. All questions were addressed. A timeout was performed prior to the initiation of the procedure. Patient was placed prone on the CT scanner. CT images through the lower abdomen  were obtained. The right paraspinal muscular abscess was identified at the L4-L5 level. Overlying skin was prepped with chlorhexidine. Sterile field was created. Skin was anesthetized with 1% lidocaine. Yueh catheter was directed into the abscess collection with CT guidance. Approximately 15 mL of brown purulent fluid was aspirated. No additional fluid could be aspirated. At this point in the procedure the  patient was in severe pain from lying prone and the procedure was stopped. Bandage placed over the puncture site. FINDINGS: Low-density abscess in the right paraspinal musculature at L4-L5. 15 mL of brown purulent fluid was removed from this collection. IMPRESSION: CT-guided aspiration of the right paraspinal abscess at L4-L5. 15 mL of purulent fluid was removed and sent for culture. Electronically Signed   By: Markus Daft M.D.   On: 07/06/2019 19:22   Vas [place] Lower Extremity Venous (dvt)  Result Date: 07/07/2019  Lower Venous Study Indications: Pain.  Risk Factors: Abscess of spinal canal, right gluteal muscle and iliopsoas muscle. Comparison Study: No prior study on file Performing Technologist: Sharion Dove RVS  Examination Guidelines: A complete evaluation includes B-mode imaging, spectral Doppler, color Doppler, and power Doppler as needed of all accessible portions of each vessel. Bilateral testing is considered an integral part of a complete examination. Limited examinations for reoccurring indications may be performed as noted.  +---------+---------------+---------+-----------+----------+--------------+ RIGHT    CompressibilityPhasicitySpontaneityPropertiesThrombus Aging +---------+---------------+---------+-----------+----------+--------------+ CFV      Full           Yes      Yes                                 +---------+---------------+---------+-----------+----------+--------------+ SFJ      Full                                                        +---------+---------------+---------+-----------+----------+--------------+ FV Prox  Full                                                        +---------+---------------+---------+-----------+----------+--------------+ FV Mid   Full                                                        +---------+---------------+---------+-----------+----------+--------------+ FV DistalFull                                                         +---------+---------------+---------+-----------+----------+--------------+ PFV      Full                                                        +---------+---------------+---------+-----------+----------+--------------+ POP  Full           Yes      Yes                                 +---------+---------------+---------+-----------+----------+--------------+ PTV      Full                                                        +---------+---------------+---------+-----------+----------+--------------+ PERO     Full                                                        +---------+---------------+---------+-----------+----------+--------------+   +----+---------------+---------+-----------+----------+--------------+ LEFTCompressibilityPhasicitySpontaneityPropertiesThrombus Aging +----+---------------+---------+-----------+----------+--------------+ CFV Full           Yes      Yes                                 +----+---------------+---------+-----------+----------+--------------+     Summary: Right: There is no evidence of deep vein thrombosis in the lower extremity. Left: No evidence of common femoral vein obstruction.  *See table(s) above for measurements and observations.    Preliminary      Assessment/Plan: MSSA disseminated infection = continue with cefazolin 2gm IV Q8hr. Please repeat blood cx today to see that he is clearing his bacteremia. Will need TEE.   Spoke with IR, general surgery and orthopedics to who would be best for debridement of gluteal/pelvic multiple abscess. Imaging evaluated by Dr Doreatha Martin who will post patient for debridement tomorrow depending on staff availability. Patient to be NPO tonight  Debridement likely to help not only debulk infection but also may decrease inflammation about right sciatica and hopefully decreased pain for patient.  Right calf pain = doppler negative for DVT. Continue to monitor  [facility] for Infectious Diseases Cell: [phone] Pager: [phone]  07/08/2019, 2:04 PM

## 2019-07-08 NOTE — Progress Notes (Addendum)
PROGRESS NOTE    Bradley Cox  [id number]  DOB: 09/09/1971  DOA: 07/05/2019 PCP: Simona Huh, NP  Brief Narrative:  48 year old male with history of obesity, asthma, hypertension, GERD, nephrolithiasis, recently diagnosed diabetes mellitus presents with complaints of unrelenting low back pain radiating to the right gluteus/thigh associated with progressive decline in right lower extremity strength/ambulation over the last 2-3 weeks. Patient's clinical course started when he presented to [facility] urgent care on August 15 with complaints of gross hematuria.  Patient states he did not have any pain at that time but underwent CT imaging which showed right ureteral inflammation as well as additional nephrolithiasis (2 stones) and was given right gluteus IM Toradol apparently in anticipation of him passing the additional stones.  2 days later on August 17, patient presented to [facility] ED with complaints of right flank pain radiating to right lower extremity.  He underwent repeat CT imaging which showed 2 intrarenal calculi in the mid and lower pole regions of the RIGHT kidney, largest measuring 0.6 centimeters as well as mild stranding along the right ureter without any ureteric stones visualized.  During this visit he was also noted to have blood glucose of 455 which improved to 345 with IV hydration.  In this visit CBC was unremarkable, although patient had an isolated temp of 100.1.  He was discharged home with prescription for ciprofloxacin 500 mg every 12 and metformin 500 mg twice daily. On August 24, patient again presented to [facility] with complaints of  worsening low back pain radiating to right lower extremity and was prescribed prednisone for sciatica.   On September 4, patient presented here with significant deterioration in terms of pain/right lower extremity strength limiting his ambulation.  He was noted to have temp of 102.8, tachycardic and tachypneic.   WBC 10.4, sodium 127, blood glucose of 524 with bicarb of 21 and anion gap of 18.CT scan revealed a localized abscess involving the right iliac fossa and muscular at the right buttocks and paraspinal muscle.  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  Sepsis protocol was initiated and patient admitted to medical service with insulin drip for DKA and broad-spectrum antibiotics including IV vancomycin, cefepime and clindamycin.  Patient did undergo IR paraspinal abscess drainage and neurosurgery has been following.  Blood cultures now growing MSSA.   Subjective:  Patient appears more comfortable today in term of pain. On 4 lits [place] , saturating well. Wife bedside  Objective: Vitals:   07/07/19 2335 07/08/19 0300 07/08/19 0739 07/08/19 1100  BP: 124/66 110/62 123/75 122/80  Pulse: (!) 106 (!) 102 98 94  Resp: 20 18 12 17   Temp: 98.7 F (37.1 C) 99 F (37.2 C) 98.6 F (37 C) 98.6 F (37 C)  TempSrc: Oral Oral Oral Oral  SpO2: 98% 96% 96% 93%  Weight:      Height:        Intake/Output Summary (Last 24 hours) at 07/08/2019 1734 Last data filed at 07/08/2019 1234 Gross per 24 hour  Intake 120 ml  Output 3500 ml  Net -3380 ml   Filed Weights   07/05/19 1807 07/06/19 2116  Weight: (!) 167.4 kg (!) 164 kg    Physical Examination:  General exam: Appears to be in severe distress due to pain Respiratory system: Clear to auscultation. Respiratory effort normal. Cardiovascular system: S1 &  S2 heard, RRR. No JVD, murmurs, rubs, gallops or clicks. No pedal edema. Gastrointestinal system: Abdomen is obese, soft and nontender. No organomegaly or masses felt. Normal bowel sounds heard. Central nervous system: Alert and oriented. No focal neurological deficits except for decreased range of motion/strength along the right lower extremity in the setting of  infection/pain Extremities: Right calf swelling noted, tight, erythematous and tender. Swelling along sole of left forefoot/tender ( says stepped on a plug) Psychiatry: Judgement and insight appear normal. Mood & affect appropriate.     Data Reviewed: I have personally reviewed following labs and imaging studies  CBC: Recent Labs  Lab 07/06/19 0359 07/06/19 1738 07/07/19 0521 07/08/19 1019  WBC 10.4  --  6.7 6.3  NEUTROABS 8.6*  --   --   --   HGB 14.0 12.6* 11.1* 10.7*  HCT 42.6 37.0* 34.2* 31.6*  MCV 94.2  --  92.4 90.5  PLT 256  --  197 [id number]   Basic Metabolic Panel: Recent Labs  Lab 07/06/19 0526 07/06/19 1738 07/06/19 2213 07/07/19 0521 07/08/19 1019  NA 127* 132* 132* 127* 123*  K 6.1* 4.6 4.8 4.1 4.2  CL 88* 96* 95* 92* 86*  CO2 21*  --  26 24 27   GLUCOSE 524* 338* 107* 130* 307*  BUN 21* 17 14 11 7   CREATININE 1.36* 0.60* 0.75 0.64 0.70  CALCIUM 9.2  --  8.4* 8.2* 8.0*   GFR: Estimated Creatinine Clearance: 190.2 mL/min (by C-G formula based on SCr of 0.7 mg/dL). Liver Function Tests: Recent Labs  Lab 07/06/19 0526  AST 25  ALT 19  ALKPHOS 84  BILITOT 1.0  PROT 7.7  ALBUMIN 1.9*   No results for input(s): LIPASE, AMYLASE in the last 168 hours. No results for input(s): AMMONIA in the last 168 hours. Coagulation Profile: No results for input(s): INR, PROTIME in the last 168 hours. Cardiac Enzymes: No results for input(s): CKTOTAL, CKMB, CKMBINDEX, TROPONINI in the last 168 hours. BNP (last 3 results) No results for input(s): PROBNP in the last 8760 hours. HbA1C: Recent Labs    07/07/19 0900  HGBA1C 12.6*   CBG: Recent Labs  Lab 07/07/19 1714 07/07/19 2200 07/08/19 0737 07/08/19 1212 07/08/19 1702  GLUCAP 207* 238* 264* 305* 301*   Lipid Profile: No results for input(s): CHOL, HDL, LDLCALC, TRIG, CHOLHDL, LDLDIRECT in the last 72 hours. Thyroid Function Tests: No results for input(s): TSH, T4TOTAL, FREET4, T3FREE, THYROIDAB in the last  72 hours. Anemia Panel: No results for input(s): VITAMINB12, FOLATE, FERRITIN, TIBC, IRON, RETICCTPCT in the last 72 hours. Sepsis Labs: Recent Labs  Lab 07/06/19 0359 07/06/19 1445 07/06/19 2213  LATICACIDVEN 3.0* 2.2* 2.6*    Recent Results (from the past 240 hour(s))  Blood culture (routine x 2)     Status: Abnormal   Collection Time: 07/06/19  4:30 AM   Specimen: BLOOD  Result Value Ref Range Status   Specimen Description BLOOD SITE NOT SPECIFIED  Final   Special Requests   Final    BOTTLES DRAWN AEROBIC AND ANAEROBIC Blood Culture results may not be optimal due to an inadequate volume of blood received in culture bottles   Culture  Setup Time   Final    IN BOTH AEROBIC AND ANAEROBIC BOTTLES GRAM POSITIVE COCCI Organism ID to follow CRITICAL RESULT CALLED TO, READ BACK BY AND VERIFIED WITHDion Body [facility] 07/06/19 2058 JDW Performed at [facility], [place] [address], [place], [place] [address]    Culture  STAPHYLOCOCCUS AUREUS (A)  Final   Report Status 07/08/2019 FINAL  Final   Organism ID, Bacteria STAPHYLOCOCCUS AUREUS  Final      Susceptibility   Staphylococcus aureus - MIC*    CIPROFLOXACIN <=0.5 SENSITIVE Sensitive     ERYTHROMYCIN >=8 RESISTANT Resistant     GENTAMICIN <=0.5 SENSITIVE Sensitive     OXACILLIN <=0.25 SENSITIVE Sensitive     TETRACYCLINE >=16 RESISTANT Resistant     VANCOMYCIN 1 SENSITIVE Sensitive     TRIMETH/SULFA <=10 SENSITIVE Sensitive     CLINDAMYCIN RESISTANT Resistant     RIFAMPIN <=0.5 SENSITIVE Sensitive     Inducible Clindamycin POSITIVE Resistant     * STAPHYLOCOCCUS AUREUS  Blood culture (routine x 2)     Status: Abnormal   Collection Time: 07/06/19  4:30 AM   Specimen: BLOOD  Result Value Ref Range Status   Specimen Description BLOOD SITE NOT SPECIFIED  Final   Special Requests   Final    BOTTLES DRAWN AEROBIC AND ANAEROBIC Blood Culture adequate volume   Culture  Setup Time   Final    IN BOTH AEROBIC AND ANAEROBIC BOTTLES  GRAM POSITIVE COCCI CRITICAL VALUE NOTED.  VALUE IS CONSISTENT WITH PREVIOUSLY REPORTED AND CALLED VALUE.    Culture (A)  Final    STAPHYLOCOCCUS AUREUS SUSCEPTIBILITIES PERFORMED ON PREVIOUS CULTURE WITHIN THE LAST 5 DAYS. Performed at [facility], [place] [address] [place], [place] [address]    Report Status 07/08/2019 FINAL  Final  Blood Culture ID Panel (Reflexed)     Status: Abnormal   Collection Time: 07/06/19  4:30 AM  Result Value Ref Range Status   Enterococcus species NOT DETECTED NOT DETECTED Final   Listeria monocytogenes NOT DETECTED NOT DETECTED Final   Staphylococcus species DETECTED (A) NOT DETECTED Final    Comment: CRITICAL RESULT CALLED TO, READ BACK BY AND VERIFIED WITH: J MILLEN PHARMD 07/06/19 2058 JDW    Staphylococcus aureus (BCID) DETECTED (A) NOT DETECTED Final    Comment: Methicillin (oxacillin) susceptible Staphylococcus aureus (MSSA). Preferred therapy is anti staphylococcal beta lactam antibiotic (Cefazolin or Nafcillin), unless clinically contraindicated. CRITICAL RESULT CALLED TO, READ BACK BY AND VERIFIED WITH: J MILLEN PHARMD 07/06/19 2058 JDW    Methicillin resistance NOT DETECTED NOT DETECTED Final   Streptococcus species NOT DETECTED NOT DETECTED Final   Streptococcus agalactiae NOT DETECTED NOT DETECTED Final   Streptococcus pneumoniae NOT DETECTED NOT DETECTED Final   Streptococcus pyogenes NOT DETECTED NOT DETECTED Final   Acinetobacter baumannii NOT DETECTED NOT DETECTED Final   Enterobacteriaceae species NOT DETECTED NOT DETECTED Final   Enterobacter cloacae complex NOT DETECTED NOT DETECTED Final   Escherichia coli NOT DETECTED NOT DETECTED Final   Klebsiella oxytoca NOT DETECTED NOT DETECTED Final   Klebsiella pneumoniae NOT DETECTED NOT DETECTED Final   Proteus species NOT DETECTED NOT DETECTED Final   Serratia marcescens NOT DETECTED NOT DETECTED Final   Haemophilus influenzae NOT DETECTED NOT DETECTED Final   Neisseria meningitidis  NOT DETECTED NOT DETECTED Final   Pseudomonas aeruginosa NOT DETECTED NOT DETECTED Final   Candida albicans NOT DETECTED NOT DETECTED Final   Candida glabrata NOT DETECTED NOT DETECTED Final   Candida krusei NOT DETECTED NOT DETECTED Final   Candida parapsilosis NOT DETECTED NOT DETECTED Final   Candida tropicalis NOT DETECTED NOT DETECTED Final    Comment: Performed at [facility], [place] [address] [place], [place] [address]  Urine culture     Status: Abnormal  Collection Time: 07/06/19  4:37 AM   Specimen: Urine, Random  Result Value Ref Range Status   Specimen Description URINE, RANDOM  Final   Special Requests   Final    NONE Performed at [facility], [place] [address] [place], [place] [address]    Culture (A)  Final    >=100,000 COLONIES/mL MULTIPLE SPECIES PRESENT, SUGGEST RECOLLECTION   Report Status 07/07/2019 FINAL  Final  SARS Coronavirus 2 [facility] order, Performed in [facility] hospital lab) Nasopharyngeal Nasopharyngeal Swab     Status: None   Collection Time: 07/06/19  8:04 AM   Specimen: Nasopharyngeal Swab  Result Value Ref Range Status   SARS Coronavirus 2 NEGATIVE NEGATIVE Final    Comment: (NOTE) If result is NEGATIVE SARS-CoV-2 target nucleic acids are NOT DETECTED. The SARS-CoV-2 RNA is generally detectable in upper and lower  respiratory specimens during the acute phase of infection. The lowest  concentration of SARS-CoV-2 viral copies this assay can detect is 250  copies / mL. A negative result does not preclude SARS-CoV-2 infection  and should not be used as the sole basis for treatment or other  patient management decisions.  A negative result may occur with  improper specimen collection / handling, submission of specimen other  than nasopharyngeal swab, presence of viral mutation(s) within the  areas targeted by this assay, and inadequate number of viral copies  (<250 copies / mL). A negative result must be combined with clinical   observations, patient history, and epidemiological information. If result is POSITIVE SARS-CoV-2 target nucleic acids are DETECTED. The SARS-CoV-2 RNA is generally detectable in upper and lower  respiratory specimens dur ing the acute phase of infection.  Positive  results are indicative of active infection with SARS-CoV-2.  Clinical  correlation with patient history and other diagnostic information is  necessary to determine patient infection status.  Positive results do  not rule out bacterial infection or co-infection with other viruses. If result is PRESUMPTIVE POSTIVE SARS-CoV-2 nucleic acids MAY BE PRESENT.   A presumptive positive result was obtained on the submitted specimen  and confirmed on repeat testing.  While 2019 novel coronavirus  (SARS-CoV-2) nucleic acids may be present in the submitted sample  additional confirmatory testing may be necessary for epidemiological  and / or clinical management purposes  to differentiate between  SARS-CoV-2 and other Sarbecovirus currently known to infect humans.  If clinically indicated additional testing with an alternate test  methodology [phone]) is advised. The SARS-CoV-2 RNA is generally  detectable in upper and lower respiratory sp ecimens during the acute  phase of infection. The expected result is Negative. Fact Sheet for Patients:  StrictlyIdeas.no Fact Sheet for Healthcare Providers: BankingDealers.co.za This test is not yet approved or cleared by the [place] FDA and has been authorized for detection and/or diagnosis of SARS-CoV-2 by FDA under an Emergency Use Authorization (EUA).  This EUA will remain in effect (meaning this test can be used) for the duration of the COVID-19 declaration under Section 564(b)(1) of the Act, 21 U.S.C. section 360bbb-3(b)(1), unless the authorization is terminated or revoked sooner. Performed at [facility], [place] [address]  [place], [place] [address]   Aerobic/Anaerobic Culture (surgical/deep wound)     Status: None (Preliminary result)   Collection Time: 07/06/19  6:48 PM   Specimen: Abscess  Result Value Ref Range Status   Specimen Description ABSCESS  Final   Special Requests PARASPINAL  Final   Gram Stain   Final  FEW WBC PRESENT, PREDOMINANTLY PMN ABUNDANT GRAM POSITIVE COCCI Performed at [place] [address] [place], [place] [address]    Culture   Final    ABUNDANT STAPHYLOCOCCUS AUREUS NO ANAEROBES ISOLATED; CULTURE IN PROGRESS FOR 5 DAYS    Report Status PENDING  Incomplete   Organism ID, Bacteria STAPHYLOCOCCUS AUREUS  Final      Susceptibility   Staphylococcus aureus - MIC*    CIPROFLOXACIN <=0.5 SENSITIVE Sensitive     ERYTHROMYCIN >=8 RESISTANT Resistant     GENTAMICIN <=0.5 SENSITIVE Sensitive     OXACILLIN <=0.25 SENSITIVE Sensitive     TETRACYCLINE >=16 RESISTANT Resistant     VANCOMYCIN 1 SENSITIVE Sensitive     TRIMETH/SULFA <=10 SENSITIVE Sensitive     CLINDAMYCIN RESISTANT Resistant     RIFAMPIN <=0.5 SENSITIVE Sensitive     Inducible Clindamycin POSITIVE Resistant     * ABUNDANT STAPHYLOCOCCUS AUREUS      Radiology Studies: Ct Aspiration  Result Date: 07/06/2019 INDICATION: 48 year old with multiple sites of infection including a lumbar paraspinal muscular abscess. Plan for CT-guided aspiration of the paraspinal abscess collection. EXAM: CT-GUIDED ASPIRATION OF PARASPINAL ABSCESS MEDICATIONS: Moderate sedation ANESTHESIA/SEDATION: Fentanyl 100 mcg IV; Versed 2.0 mg IV Moderate Sedation Time:  10 minutes The patient was continuously monitored during the procedure by the interventional radiology nurse under my direct supervision. COMPLICATIONS: None immediate. PROCEDURE: Informed written consent was obtained from the patient after a thorough discussion of the procedural risks, benefits and alternatives. All questions were addressed. A timeout was performed prior to the  initiation of the procedure. Patient was placed prone on the CT scanner. CT images through the lower abdomen were obtained. The right paraspinal muscular abscess was identified at the L4-L5 level. Overlying skin was prepped with chlorhexidine. Sterile field was created. Skin was anesthetized with 1% lidocaine. Y[place]h catheter was directed into the abscess collection with CT guidance. Approximately 15 mL of brown purulent fluid was aspirated. No additional fluid could be aspirated. At this point in the procedure the patient was in severe pain from lying prone and the procedure was stopped. Bandage placed over the puncture site. FINDINGS: Low-density abscess in the right paraspinal musculature at L4-L5. 15 mL of brown purulent fluid was removed from this collection. IMPRESSION: CT-guided aspiration of the right paraspinal abscess at L4-L5. 15 mL of purulent fluid was removed and sent for culture. Electronically Signed   By: Markus Daft M.D.   On: 07/06/2019 19:22   Vas [place] Lower Extremity Venous (dvt)  Result Date: 07/07/2019  Lower Venous Study Indications: Pain.  Risk Factors: Abscess of spinal canal, right gluteal muscle and iliopsoas muscle. Comparison Study: No prior study on file Performing Technologist: Sharion Dove RVS  Examination Guidelines: A complete evaluation includes B-mode imaging, spectral Doppler, color Doppler, and power Doppler as needed of all accessible portions of each vessel. Bilateral testing is considered an integral part of a complete examination. Limited examinations for reoccurring indications may be performed as noted.  +---------+---------------+---------+-----------+----------+--------------+ RIGHT    CompressibilityPhasicitySpontaneityPropertiesThrombus Aging +---------+---------------+---------+-----------+----------+--------------+ CFV      Full           Yes      Yes                                  +---------+---------------+---------+-----------+----------+--------------+ SFJ      Full                                                        +---------+---------------+---------+-----------+----------+--------------+  FV Prox  Full                                                        +---------+---------------+---------+-----------+----------+--------------+ FV Mid   Full                                                        +---------+---------------+---------+-----------+----------+--------------+ FV DistalFull                                                        +---------+---------------+---------+-----------+----------+--------------+ PFV      Full                                                        +---------+---------------+---------+-----------+----------+--------------+ POP      Full           Yes      Yes                                 +---------+---------------+---------+-----------+----------+--------------+ PTV      Full                                                        +---------+---------------+---------+-----------+----------+--------------+ PERO     Full                                                        +---------+---------------+---------+-----------+----------+--------------+   +----+---------------+---------+-----------+----------+--------------+ LEFTCompressibilityPhasicitySpontaneityPropertiesThrombus Aging +----+---------------+---------+-----------+----------+--------------+ CFV Full           Yes      Yes                                 +----+---------------+---------+-----------+----------+--------------+     Summary: Right: There is no evidence of deep vein thrombosis in the lower extremity. Left: No evidence of common femoral vein obstruction.  *See table(s) above for measurements and observations.    Preliminary         Scheduled Meds: . enoxaparin (LOVENOX) injection  0.5 mg/kg  Subcutaneous Q24H  . gabapentin  300 mg Oral BID  . hydrALAZINE  25 mg Oral BID  . insulin aspart  0-15 Units Subcutaneous TID WC  . insulin NPH Human  12 Units Subcutaneous BID AC & HS  . sodium chloride flush  3 mL Intravenous Q12H   Continuous Infusions: .  ceFAZolin (ANCEF) IV 2 g (  07/08/19 1708)  . dextrose 5 % and 0.45% NaCl 100 mL/hr at 07/07/19 0414    Assessment & Plan:    1.  Sepsis/MSSA bacteremia secondary to L3-S1 epidural abscess/right iliopsoas and gluteus abscess: Source of infection could have been intramuscular Toradol injection on August 15 vs skin penetration from left foot injury .Infection likely worsened in the setting of recent steroid use.  Blood cultures growing MSSA. Seen by ID. Continue  IV cefazolin. TTE-poor quality but no vegetations noted. Appreciate ID discussions with GS/ortho and IR. Plan for drainage in OR by ortho in am. Will likely also need TEE at some point. Continue IV fentanyl and oral oxycodone Prn  2.  DKA: Off insulin drip.Transitioned to NPH twice daily/sliding scale, will titrate up (modestly as NPO after MN) per blood glucose levels.  Holding metformin in the setting of problem #1. Hemoglobin A1c 12.6. Will need insulin on discharge.  3.  AKI: Present on admission with creatinine at 1.3.  Prerenal versus ATN in the setting of problem #1.  Improved with IV hydration.  4.  Hypertension: Blood pressure normotensive as of now in systolic [id number].  Watch for hypotension in the setting of problem #1. Continue low dose hydralazine.  Will benefit from ACE inhibitors at the time of discharge in the setting of diabetes.  5.  Right calf swelling/redness: Secondary to bacteremia/seeding of infection versus phlebitis . Doppler lower extremity -ve for DVT  Continue antibiotics.  6. GERD: PPI  7.  Fatty liver/?  Early cirrhosis: Seen on CT abdomen.  Could be related to nonalcoholic fatty liver disease in the setting of hypertension, diabetes, morbid  obesity.  8. Morbid obesity: : BMI 42.64 kg/m.  Follow-up PCP and discussions regarding weight loss when acute issues resolve   DVT prophylaxis: Lovenox Code Status: Full code Family / Patient Communication: Discussed with patient and family bedside in detail and all questions answered to satisfaction.  Disposition Plan: Home when medically cleared, may need short-term rehab     LOS: 2 days    Time spent: 35 minutes    [place] Shi, MD [facility] Pager [phone]  If 7PM-7AM, please contact night-coverage [email or web address] Password [facility] 07/08/2019, 5:34 PM

## 2019-07-09 ENCOUNTER — Inpatient Hospital Stay (HOSPITAL_COMMUNITY): Payer: Self-pay | Admitting: Certified Registered Nurse Anesthetist

## 2019-07-09 ENCOUNTER — Encounter (HOSPITAL_COMMUNITY)
Admission: EM | Disposition: A | Discharge status: Rehabilitation Facility | Payer: Self-pay | Source: Home / Self Care | Attending: Family Medicine

## 2019-07-09 DIAGNOSIS — L02415 Cutaneous abscess of right lower limb: Secondary | ICD-10-CM

## 2019-07-09 DIAGNOSIS — G5701 Lesion of sciatic nerve, right lower limb: Secondary | ICD-10-CM

## 2019-07-09 DIAGNOSIS — M4620 Osteomyelitis of vertebra, site unspecified: Secondary | ICD-10-CM

## 2019-07-09 HISTORY — PX: INCISION AND DRAINAGE ABSCESS: SHX5864

## 2019-07-09 LAB — BASIC METABOLIC PANEL
Anion gap: 10 (ref 5–15)
BUN: 6 mg/dL (ref 6–20)
CO2: 28 mmol/L (ref 22–32)
Calcium: 8 mg/dL — ABNORMAL LOW (ref 8.9–10.3)
Chloride: 86 mmol/L — ABNORMAL LOW (ref 98–111)
Creatinine, Ser: 0.55 mg/dL — ABNORMAL LOW (ref 0.61–1.24)
GFR calc Af Amer: >60 mL/min (ref >60)
GFR calc non Af Amer: >60 mL/min (ref >60)
Glucose, Bld: 276 mg/dL — ABNORMAL HIGH (ref 70–99)
Potassium: 3.8 mmol/L (ref 3.5–5.1)
Sodium: 124 mmol/L — ABNORMAL LOW (ref 135–145)

## 2019-07-09 LAB — GLUCOSE, CAPILLARY
Glucose-Capillary: 229 mg/dL — ABNORMAL HIGH (ref 70–99)
Glucose-Capillary: 254 mg/dL — ABNORMAL HIGH (ref 70–99)
Glucose-Capillary: 247 mg/dL — ABNORMAL HIGH (ref 70–99)
Glucose-Capillary: 248 mg/dL — ABNORMAL HIGH (ref 70–99)

## 2019-07-09 LAB — CBC
HCT: 31 % — ABNORMAL LOW (ref 39.0–52.0)
Hemoglobin: 10.4 g/dL — ABNORMAL LOW (ref 13.0–17.0)
MCH: 30.1 pg (ref 26.0–34.0)
MCHC: 33.5 g/dL (ref 30.0–36.0)
MCV: 89.6 fL (ref 80.0–100.0)
Platelets: 168 10*3/uL (ref 150–400)
RBC: 3.46 MIL/uL — ABNORMAL LOW (ref 4.22–5.81)
RDW: 12.2 % (ref 11.5–15.5)
WBC: 5.9 10*3/uL (ref 4.0–10.5)
nRBC: 0 % (ref 0.0–0.2)

## 2019-07-09 SURGERY — INCISION AND DRAINAGE, ABSCESS
Anesthesia: General | Laterality: Right

## 2019-07-09 MED ORDER — INSULIN ASPART 100 UNIT/ML ~~LOC~~ SOLN
SUBCUTANEOUS | Status: DC | PRN
  Administered 2019-07-09: 3 [IU] via SUBCUTANEOUS

## 2019-07-09 MED ORDER — PROMETHAZINE HCL 25 MG/ML IJ SOLN
INTRAMUSCULAR | Status: AC
  Administered 2019-07-09: 6.25 mg via INTRAVENOUS
  Filled 2019-07-09: qty 1

## 2019-07-09 MED ORDER — VANCOMYCIN HCL 1000 MG IV SOLR
INTRAVENOUS | Status: DC | PRN
  Administered 2019-07-09: 1000 mg

## 2019-07-09 MED ORDER — TOBRAMYCIN SULFATE 1.2 G IJ SOLR
INTRAMUSCULAR | Status: AC
  Filled 2019-07-09: qty 1.2

## 2019-07-09 MED ORDER — MIDAZOLAM HCL 2 MG/2ML IJ SOLN
INTRAMUSCULAR | Status: AC
  Filled 2019-07-09: qty 2

## 2019-07-09 MED ORDER — PROPOFOL 10 MG/ML IV BOLUS
INTRAVENOUS | Status: AC
  Filled 2019-07-09: qty 40

## 2019-07-09 MED ORDER — VANCOMYCIN HCL 1000 MG IV SOLR
INTRAVENOUS | Status: AC
  Filled 2019-07-09: qty 1000

## 2019-07-09 MED ORDER — SUGAMMADEX SODIUM 200 MG/2ML IV SOLN
INTRAVENOUS | Status: DC | PRN
  Administered 2019-07-09: 400 mg via INTRAVENOUS

## 2019-07-09 MED ORDER — HYDROMORPHONE HCL 1 MG/ML IJ SOLN: 0.2500 mg | INTRAMUSCULAR | Status: DC | PRN

## 2019-07-09 MED ORDER — SODIUM CHLORIDE 0.9 % IR SOLN
Status: DC | PRN
  Administered 2019-07-09: 6000 mL

## 2019-07-09 MED ORDER — FENTANYL CITRATE (PF) 250 MCG/5ML IJ SOLN
INTRAMUSCULAR | Status: AC
  Filled 2019-07-09: qty 5

## 2019-07-09 MED ORDER — ACETAMINOPHEN 10 MG/ML IV SOLN
INTRAVENOUS | Status: AC
  Administered 2019-07-09: 1000 mg via INTRAVENOUS
  Filled 2019-07-09: qty 100

## 2019-07-09 MED ORDER — ONDANSETRON HCL 4 MG/2ML IJ SOLN
INTRAMUSCULAR | Status: AC
  Filled 2019-07-09: qty 2

## 2019-07-09 MED ORDER — LIDOCAINE 2% (20 MG/ML) 5 ML SYRINGE
INTRAMUSCULAR | Status: DC | PRN
  Administered 2019-07-09: 60 mg via INTRAVENOUS

## 2019-07-09 MED ORDER — FENTANYL CITRATE (PF) 250 MCG/5ML IJ SOLN
INTRAMUSCULAR | Status: DC | PRN
  Administered 2019-07-09: 100 ug via INTRAVENOUS
  Administered 2019-07-09: 150 ug via INTRAVENOUS

## 2019-07-09 MED ORDER — SODIUM CHLORIDE 0.9 % IV SOLN
INTRAVENOUS | Status: DC | PRN
  Administered 2019-07-09: 08:00:00 via INTRAVENOUS

## 2019-07-09 MED ORDER — PHENYLEPHRINE 40 MCG/ML (10ML) SYRINGE FOR IV PUSH (FOR BLOOD PRESSURE SUPPORT)
PREFILLED_SYRINGE | INTRAVENOUS | Status: AC
  Filled 2019-07-09: qty 10

## 2019-07-09 MED ORDER — KETOROLAC TROMETHAMINE 30 MG/ML IJ SOLN
30.0000 mg | Freq: Once | INTRAMUSCULAR | Status: AC
  Administered 2019-07-09: 10:00:00 30 mg via INTRAVENOUS

## 2019-07-09 MED ORDER — KETOROLAC TROMETHAMINE 30 MG/ML IJ SOLN
INTRAMUSCULAR | Status: AC
  Administered 2019-07-09: 30 mg via INTRAVENOUS
  Filled 2019-07-09: qty 1

## 2019-07-09 MED ORDER — ROCURONIUM BROMIDE 10 MG/ML (PF) SYRINGE
PREFILLED_SYRINGE | INTRAVENOUS | Status: DC | PRN
  Administered 2019-07-09: 30 mg via INTRAVENOUS
  Administered 2019-07-09: 20 mg via INTRAVENOUS

## 2019-07-09 MED ORDER — ONDANSETRON HCL 4 MG/2ML IJ SOLN
INTRAMUSCULAR | Status: DC | PRN
  Administered 2019-07-09: 4 mg via INTRAVENOUS

## 2019-07-09 MED ORDER — ACETAMINOPHEN 10 MG/ML IV SOLN
1000.0000 mg | Freq: Once | INTRAVENOUS | Status: DC | PRN
  Administered 2019-07-09: 10:00:00 1000 mg via INTRAVENOUS

## 2019-07-09 MED ORDER — PROMETHAZINE HCL 25 MG/ML IJ SOLN
6.2500 mg | INTRAMUSCULAR | Status: DC | PRN
  Administered 2019-07-09: 10:00:00 6.25 mg via INTRAVENOUS

## 2019-07-09 MED ORDER — SODIUM CHLORIDE 0.9 % IV SOLN
INTRAVENOUS | Status: DC
  Administered 2019-07-09 – 2019-07-14 (×6): via INTRAVENOUS

## 2019-07-09 MED ORDER — LACTATED RINGERS IV SOLN
INTRAVENOUS | Status: DC | PRN
  Administered 2019-07-09: 08:00:00 via INTRAVENOUS

## 2019-07-09 MED ORDER — PROPOFOL 10 MG/ML IV BOLUS
INTRAVENOUS | Status: DC | PRN
  Administered 2019-07-09: 150 mg via INTRAVENOUS
  Administered 2019-07-09: 50 mg via INTRAVENOUS

## 2019-07-09 MED ORDER — SUCCINYLCHOLINE CHLORIDE 200 MG/10ML IV SOSY
PREFILLED_SYRINGE | INTRAVENOUS | Status: DC | PRN
  Administered 2019-07-09: 160 mg via INTRAVENOUS

## 2019-07-09 MED ORDER — TOBRAMYCIN SULFATE 1.2 G IJ SOLR
INTRAMUSCULAR | Status: DC | PRN
  Administered 2019-07-09: 1.2 g

## 2019-07-09 MED ORDER — 0.9 % SODIUM CHLORIDE (POUR BTL) OPTIME
TOPICAL | Status: DC | PRN
  Administered 2019-07-09: 1000 mL

## 2019-07-09 SURGICAL SUPPLY — 44 items
BRUSH FEMORAL CANAL (MISCELLANEOUS) IMPLANT
BRUSH SCRUB EZ PLAIN DRY (MISCELLANEOUS) ×4 IMPLANT
COVER SURGICAL LIGHT HANDLE (MISCELLANEOUS) ×4 IMPLANT
COVER WAND RF STERILE (DRAPES) ×2 IMPLANT
DRAPE IMP U-DRAPE 54X76 (DRAPES) ×2 IMPLANT
DRAPE ORTHO SPLIT 77X108 STRL (DRAPES) ×2
DRAPE SURG ORHT 6 SPLT 77X108 (DRAPES) ×2 IMPLANT
DRAPE U-SHAPE 47X51 STRL (DRAPES) ×2 IMPLANT
DRSG MEPILEX BORDER 4X12 (GAUZE/BANDAGES/DRESSINGS) ×1 IMPLANT
DRSG PAD ABDOMINAL 8X10 ST (GAUZE/BANDAGES/DRESSINGS) ×2 IMPLANT
DRSG TEGADERM 2-3/8X2-3/4 SM (GAUZE/BANDAGES/DRESSINGS) ×1 IMPLANT
ELECT BLADE 6.5 EXT (BLADE) ×2 IMPLANT
ELECT CAUTERY BLADE 6.4 (BLADE) ×2 IMPLANT
ELECT REM PT RETURN 9FT ADLT (ELECTROSURGICAL) ×2
ELECTRODE REM PT RTRN 9FT ADLT (ELECTROSURGICAL) ×1 IMPLANT
EVACUATOR 1/8 PVC DRAIN (DRAIN) IMPLANT
GAUZE SPONGE 4X4 12PLY STRL (GAUZE/BANDAGES/DRESSINGS) ×2 IMPLANT
GAUZE XEROFORM 5X9 LF (GAUZE/BANDAGES/DRESSINGS) ×2 IMPLANT
GLOVE BIO SURGEON STRL SZ7.5 (GLOVE) ×2 IMPLANT
GLOVE BIO SURGEON STRL SZ8 (GLOVE) ×2 IMPLANT
GLOVE BIOGEL PI IND STRL 7.5 (GLOVE) ×1 IMPLANT
GLOVE BIOGEL PI IND STRL 8 (GLOVE) ×1 IMPLANT
GLOVE BIOGEL PI INDICATOR 7.5 (GLOVE) ×1
GLOVE BIOGEL PI INDICATOR 8 (GLOVE) ×1
GOWN STRL REUS W/ TWL XL LVL3 (GOWN DISPOSABLE) ×1 IMPLANT
GOWN STRL REUS W/TWL 2XL LVL3 (GOWN DISPOSABLE) ×5 IMPLANT
GOWN STRL REUS W/TWL XL LVL3 (GOWN DISPOSABLE) ×1
HANDPIECE INTERPULSE COAX TIP (DISPOSABLE)
KIT TURNOVER KIT B (KITS) ×2 IMPLANT
MANIFOLD NEPTUNE II (INSTRUMENTS) ×2 IMPLANT
NEEDLE 22X1 1/2 (OR ONLY) (NEEDLE) IMPLANT
NS IRRIG 1000ML POUR BTL (IV SOLUTION) ×2 IMPLANT
PACK TOTAL JOINT (CUSTOM PROCEDURE TRAY) ×2 IMPLANT
PACK UNIVERSAL I (CUSTOM PROCEDURE TRAY) ×2 IMPLANT
PILLOW ABDUCTION MEDIUM (MISCELLANEOUS) IMPLANT
SET HNDPC FAN SPRY TIP SCT (DISPOSABLE) IMPLANT
SPONGE LAP 18X18 RF (DISPOSABLE) IMPLANT
SUT ETHILON 2 0 PSLX (SUTURE) ×2 IMPLANT
SUT MON AB 2-0 CT1 36 (SUTURE) ×1 IMPLANT
SUT PDS AB 0 CT1 27 (SUTURE) ×1 IMPLANT
TOWEL GREEN STERILE (TOWEL DISPOSABLE) ×4 IMPLANT
TOWEL GREEN STERILE FF (TOWEL DISPOSABLE) ×2 IMPLANT
TOWER CARTRIDGE SMART MIX (DISPOSABLE) IMPLANT
TRAY FOLEY MTR SLVR 16FR STAT (SET/KITS/TRAYS/PACK) IMPLANT

## 2019-07-09 NOTE — Anesthesia Postprocedure Evaluation (Signed)
Anesthesia Post Note  Patient: Bradley Cox  Procedure(s) Performed: INCISION AND DRAINAGE ABSCESS RIGHT HIP/PELVIS (Right )     Patient location during evaluation: PACU Anesthesia Type: General Level of consciousness: awake Pain management: pain level controlled Vital Signs Assessment: post-procedure vital signs reviewed and stable Respiratory status: spontaneous breathing, nonlabored ventilation, respiratory function stable and patient connected to nasal cannula oxygen Cardiovascular status: blood pressure returned to baseline and stable Postop Assessment: no apparent nausea or vomiting Anesthetic complications: no    Last Vitals:  Vitals:   07/09/19 1254 07/09/19 1702  BP:  128/69  Pulse: 93 (!) 103  Resp: 20 20  Temp:  36.9 C  SpO2: 97% 99%    Last Pain:  Vitals:   07/09/19 1702  TempSrc: Oral  PainSc:                  Herschell Virani P Ariq Khamis

## 2019-07-09 NOTE — Op Note (Signed)
Orthopaedic Surgery Operative Note (CSN: [id number] ) Date of Surgery: 07/09/2019  Admit Date: 07/05/2019   Diagnoses: Pre-Op Diagnoses: Right hip/pelvis abscess  Post-Op Diagnosis: Same  Procedures: 1. CPT 26990-Incision and drainage of right hip/pelvis abscess 2. CPT 64712-Neuroplasty of right sciatic nerve   Surgeons : Primary: Haddix, Thomasene Lot, MD  Assistant: Patrecia Pace, PA-C  Location: OR 3   Anesthesia:General  Antibiotics: Ancef 2g scheduled   Tourniquet time:None    Estimated Blood 123456 mL  Complications:None   Specimens: ID Type Source Tests Collected by Time Destination  A : right hip abscess Abscess Abscess ANAEROBIC CULTURE, AEROBIC/ANAEROBIC CULTURE (SURGICAL/DEEP WOUND) Haddix, Thomasene Lot, MD 07/09/2019 [phone]      Implants: * No implants in log *   Indications for Surgery: 48 year old male with a history of obesity, hypertension and new onset diabetes that presented with multifocal abscesses.  He had a large pelvic abscess in the gluteal musculature that extended through the greater sciatic notch into the pelvis.  He also had significant involvement around his sciatic nerve.  He had severe pain in his right lower extremity.  Aspiration of a paraspinal abscess showed that it was MSSA he has been on antibiotics under the direction of infectious disease.  I recommended proceeding with incision and drainage of his abscess.  Risks and benefits were discussed patient agreed to proceed with surgery.  Consent was obtained.  Operative Findings: 1.  Large intramuscular gluteal abscess that was in continuation with the greater sciatic notch with significant purulence, around 500 mL of pus was encountered 2.  Sciatic nerve was palpated and all abscess was debrided around the sciatic nerve to free this.  Procedure: The patient was identified in the preoperative holding area. Consent was confirmed with the patient and their family and all questions were answered. The  operative extremity was marked after confirmation with the patient. he was then brought back to the operating room by our anesthesia colleagues.  He was placed under general anesthetic and carefully transferred over to a regular or table.  He was placed in the lateral decubitus position with his right side up.  A beanbag was used to position him and it was deflated.  A cushion was used to free pressure from his axilla.  All bony prominences were well-padded. The operative extremity was then prepped and draped in usual sterile fashion. A preoperative timeout was performed to verify the patient, the procedure, and the extremity. Preoperative antibiotics were dosed.  A standard posterior approach to the hip was made.  Is carried down through skin and subcutaneous tissue.  Identified the IT band and incised this in line with my incision.  I split the gluteus maximus.  Here I encountered a large amount of purulence.  The abscess continued up under the gluteus maximus in continuation with the intramuscular plane of the gluteus medius.  I then was able to palpate the sciatic nerve and there was significant purulence around this as well.  I was able to enter into the greater sciatic notch from which I was able to enter the intrapelvic contents.  Encountered a significant amount of purulence here as well.  In all a total of approximately 500 mL of pus was excavated.  Once I was confident that all the abscess I could reach had been debrided I then irrigated with 6 L of normal saline.  A Hemovac drain was then placed deep to the IT band.  A gram of vancomycin powder 1.2 g of tobramycin powder were  placed into the wound.  I then performed a closure of 0 PDS for the IT band.  Scarpa's fascia was closed with 0 PDS.  The skin was closed with 2-0 Monocryl and 2-0 nylon.  A sterile dressing was placed.  The drain was sewn in place.  The patient was then awoke from anesthesia and taken to the PACU in stable condition.  Post Op  Plan/Instructions: The patient will continue on IV antibiotics.  He be weightbearing as tolerated to the right lower extremity.  DVT prophylaxis will be at the discretion of the primary team.  I was present and performed the entire surgery.  Patrecia Pace, PA-C did assist me throughout the case. An assistant was necessary given the difficulty in approach, maintenance of reduction and ability to instrument the fracture.  Katha Hamming, MD [facility]

## 2019-07-09 NOTE — Progress Notes (Addendum)
ID PROGRESS NOTE  48yo M with disseminated MSSA infection including bacteremia, Lumbosacral spinal epidural abscess, paraspinal/large gluteal/pelvic abscess with sciatica impingement.  Underwent I x D by dr Doreatha Martin. Large gluteal abscess with 552mL removed and Neuroplasty of sciatica N.  Micro: GPC noted on gram stain Repeat blood cx pending   A/P: disseminated MSSA infection  - continue with cefazolin 2gm IV q8hr since he is tolerating (has PCN anaphylaxis ) - still need TEE to evaluate for endocarditis - anticipate 6-8 wk IV abtx   Poorly controlled DM = hgb a1c of 12.6. defer to primary team for management in order to help with healing process.  Elzie [facility] [place] [phone]

## 2019-07-09 NOTE — Progress Notes (Addendum)
PROGRESS NOTE    Bradley Cox  [id number] DOB: May 26, 1971 DOA: 07/05/2019 PCP: Simona Huh, NP   Brief Narrative:  Patient is a 48 year old male with history of obesity, asthma, hypertension, GERD, nephrolithiasis, diabetes mellitus who presented with low back pain radiating to the right gluteus/thigh ICU with progressive decline in the right lower extremity strength, difficulty ambulation over 2 to 3 weeks.  On presentation he was found to be febrile, tachycardic, tachypneic.  Blood glucose was elevated with increased anion gap.  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  Patient was started on broad-spectrum antibiotics, insulin for DKA.  Neurosurgery, ID consulted.  Patient underwent IR guided his paraspinal abscess drainage.  Blood cultures now growing MSSA.  Currently on cefazolin.   Assessment & Plan:   Principal Problem:   Sepsis ([facility]) Active Problems:   Obesity, Class III, BMI 40-49.9 (morbid obesity) (HCC)   Essential hypertension   Abscess   AKI (acute kidney injury) (HCC)   Hyperkalemia   Abscess of right hip   Paraspinal abscess (HCC)   Compression of right sciatic nerve   Sepsis/MSSA bacteremia/L3-S1 epidural abscess/right iliopsoas and gluteal abscess: Currently on cefazolin.  ID following.  Underwent I&D by orthopedics on 07/09/2019.  Blood cultures showing MSSA.  He will need TEE.  TTE did not show any vegetation.  Will request cardiology tomorrow for scheduling TEE.  Continue pain management. Repeat blood cultures have been sent.  ID planning for 6 to 8 weeks of IV antibiotics. We will request for PT/OT evaluation  DKA: Off insulin drip.  Transitioned to long-acting and sliding scale.  Hemoglobin A1c of 12.6.  Will request for diabetic coordinator  consultation.  Not on insulin at home.  On metformin at home.  AKI: Resolved with IV hydration  Hypertension:  Currently blood pressure stable.  Continue current medicines  Right lower extremity swelling: Secondary to inflammation/infection.  Doppler lower extremity did not show DVT.  Continue antibiotics  GERD: Continue PPI  Fatty liver:   Could be associated  with history of  diabetes, obesity  Morbid obesity: BMI 42.6.  Hyponatremia: Continue gentle IV fluids.  Will check BMP tomorrow.         DVT prophylaxis: Lovenox Code Status: Full Family Communication: None within the bedside Disposition Plan: Home after full work-up   Consultants: ID, orthopedics  Procedures: I&D  Antimicrobials:  Anti-infectives (From admission, onward)   Start     Dose/Rate Route Frequency Ordered Stop   07/09/19 0839  vancomycin (VANCOCIN) powder  Status:  Discontinued       As needed 07/09/19 0839 07/09/19 0935   07/09/19 0839  tobramycin (NEBCIN) powder  Status:  Discontinued       As needed 07/09/19 0840 07/09/19 0935   07/07/19 1830  ceFAZolin (ANCEF) IVPB 2g/100 mL premix     2 g 200 mL/hr over 30 Minutes Intravenous Every 8 hours 07/07/19 1333     07/07/19 0900  ceFAZolin (ANCEF) IVPB 2g/100 mL premix  Status:  Discontinued     2 g 200 mL/hr over 30 Minutes Intravenous Every 8 hours 07/07/19 0847 07/07/19 1333   07/06/19 2030  clindamycin (CLEOCIN) IVPB 600 mg  Status:  Discontinued     600 mg 100 mL/hr over 30 Minutes Intravenous Every 8 hours 07/06/19 2010 07/07/19 0847   07/06/19 1800  vancomycin (VANCOCIN) 1,500 mg in sodium chloride 0.9 % 500 mL IVPB  Status:  Discontinued     1,500 mg 250 mL/hr over 120 Minutes Intravenous Every 12 hours 07/06/19 0820 07/07/19 0847   07/06/19 1400  ceFEPIme (MAXIPIME) 2 g in sodium chloride 0.9 % 100 mL IVPB  Status:  Discontinued     2 g 200 mL/hr over 30 Minutes Intravenous Every 8 hours 07/06/19 0504 07/07/19 0847   07/06/19 1400  vancomycin (VANCOCIN) 2,000 mg in sodium chloride 0.9 % 500 mL IVPB  Status:  Discontinued     2,000 mg 250 mL/hr over  120 Minutes Intravenous Every 8 hours 07/06/19 0504 07/06/19 0818   07/06/19 0745  clindamycin (CLEOCIN) IVPB 600 mg  Status:  Discontinued     600 mg 100 mL/hr over 30 Minutes Intravenous  Once 07/06/19 0737 07/06/19 2029   07/06/19 0500  ceFEPIme (MAXIPIME) 2 g in sodium chloride 0.9 % 100 mL IVPB     2 g 200 mL/hr over 30 Minutes Intravenous  Once 07/06/19 0451 07/06/19 1335   07/06/19 0500  vancomycin (VANCOCIN) 2,500 mg in sodium chloride 0.9 % 500 mL IVPB     2,500 mg 250 mL/hr over 120 Minutes Intravenous  Once 07/06/19 0451 07/06/19 1335      Subjective:  Patient seen and examined at bedside this afternoon.  Just returned from surgery.  Hemodynamically stable.  Was very drowsy/sleepy and did not follow through with communication.  Objective: Vitals:   07/09/19 1100 07/09/19 1125 07/09/19 1126 07/09/19 1254  BP:   117/69   Pulse:   96 93  Resp:   16 20  Temp: 98.4 F (36.9 C) 99 F (37.2 C)    TempSrc:  Oral    SpO2:   95% 97%  Weight:      Height:        Intake/Output Summary (Last 24 hours) at 07/09/2019 1418 Last data filed at 07/09/2019 0945 Gross per 24 hour  Intake 1122 ml  Output 6000 ml  Net -4878 ml   Filed Weights   07/05/19 1807 07/06/19 2116  Weight: (!) 167.4 kg (!) 164 kg    Examination:  General exam: Not in distress,obese HEENT:PERRL,Oral mucosa moist, Ear/Nose normal on gross exam Respiratory system: Bilateral equal air entry, normal vesicular breath sounds, no wheezes or crackles  Cardiovascular system: S1 & S2 heard, RRR. No JVD, murmurs, rubs, gallops or clicks. No pedal edema. Gastrointestinal system: Abdomen is nondistended, soft and nontender. No organomegaly or masses felt. Normal bowel sounds heard. Central nervous system: Alert and oriented. No focal neurological deficits. Extremities: Wound VAC in the right gluteal wound, right lower extremity swelling.   Data Reviewed: I have personally reviewed following labs and imaging studies   CBC: Recent Labs  Lab 07/06/19 0359 07/06/19 1738 07/07/19 0521 07/08/19 1019 07/09/19 0248  WBC 10.4  --  6.7 6.3 5.9  NEUTROABS 8.6*  --   --   --   --   HGB 14.0 12.6* 11.1* 10.7* 10.4*  HCT 42.6 37.0* 34.2* 31.6* 31.0*  MCV 94.2  --  92.4 90.5 89.6  PLT 256  --  197 172 [id number]   Basic Metabolic Panel: Recent Labs  Lab 07/06/19 0526 07/06/19 1738 07/06/19 2213 07/07/19 0521 07/08/19 1019 07/09/19 0248  NA 127* 132* 132* 127* 123* 124*  K 6.1* 4.6 4.8 4.1 4.2 3.8  CL 88* 96* 95* 92* 86* 86*  CO2 21*  --  26 24 27 28   GLUCOSE 524* 338* 107* 130* 307* 276*  BUN 21* 17 14  11 7 6   CREATININE 1.36* 0.60* 0.75 0.64 0.70 0.55*  CALCIUM 9.2  --  8.4* 8.2* 8.0* 8.0*   GFR: Estimated Creatinine Clearance: 190.2 mL/min (A) (by C-G formula based on SCr of 0.55 mg/dL (L)). Liver Function Tests: Recent Labs  Lab 07/06/19 0526  AST 25  ALT 19  ALKPHOS 84  BILITOT 1.0  PROT 7.7  ALBUMIN 1.9*   No results for input(s): LIPASE, AMYLASE in the last 168 hours. No results for input(s): AMMONIA in the last 168 hours. Coagulation Profile: No results for input(s): INR, PROTIME in the last 168 hours. Cardiac Enzymes: No results for input(s): CKTOTAL, CKMB, CKMBINDEX, TROPONINI in the last 168 hours. BNP (last 3 results) No results for input(s): PROBNP in the last 8760 hours. HbA1C: Recent Labs    07/07/19 0900  HGBA1C 12.6*   CBG: Recent Labs  Lab 07/08/19 1702 07/08/19 2114 07/08/19 2346 07/09/19 0930 07/09/19 1243  GLUCAP 301* 279* 263* 229* 254*   Lipid Profile: No results for input(s): CHOL, HDL, LDLCALC, TRIG, CHOLHDL, LDLDIRECT in the last 72 hours. Thyroid Function Tests: No results for input(s): TSH, T4TOTAL, FREET4, T3FREE, THYROIDAB in the last 72 hours. Anemia Panel: No results for input(s): VITAMINB12, FOLATE, FERRITIN, TIBC, IRON, RETICCTPCT in the last 72 hours. Sepsis Labs: Recent Labs  Lab 07/06/19 0359 07/06/19 1445 07/06/19 2213  LATICACIDVEN  3.0* 2.2* 2.6*    Recent Results (from the past 240 hour(s))  Blood culture (routine x 2)     Status: Abnormal   Collection Time: 07/06/19  4:30 AM   Specimen: BLOOD  Result Value Ref Range Status   Specimen Description BLOOD SITE NOT SPECIFIED  Final   Special Requests   Final    BOTTLES DRAWN AEROBIC AND ANAEROBIC Blood Culture results may not be optimal due to an inadequate volume of blood received in culture bottles   Culture  Setup Time   Final    IN BOTH AEROBIC AND ANAEROBIC BOTTLES GRAM POSITIVE COCCI Organism ID to follow CRITICAL RESULT CALLED TO, READ BACK BY AND VERIF[facility] 07/06/19 2058 JDW Performed at [facility], [place] [address] [place], [place] [address]    Culture STAPHYLOCOCCUS AUREUS (A)  Final   Report Status 07/08/2019 FINAL  Final   Organism ID, Bacteria STAPHYLOCOCCUS AUREUS  Final      Susceptibility   Staphylococcus aureus - MIC*    CIPROFLOXACIN <=0.5 SENSITIVE Sensitive     ERYTHROMYCIN >=8 RESISTANT Resistant     GENTAMICIN <=0.5 SENSITIVE Sensitive     OXACILLIN <=0.25 SENSITIVE Sensitive     TETRACYCLINE >=16 RESISTANT Resistant     VANCOMYCIN 1 SENSITIVE Sensitive     TRIMETH/SULFA <=10 SENSITIVE Sensitive     CLINDAMYCIN RESISTANT Resistant     RIFAMPIN <=0.5 SENSITIVE Sensitive     Inducible Clindamycin POSITIVE Resistant     * STAPHYLOCOCCUS AUREUS  Blood culture (routine x 2)     Status: Abnormal   Collection Time: 07/06/19  4:30 AM   Specimen: BLOOD  Result Value Ref Range Status   Specimen Description BLOOD SITE NOT SPECIFIED  Final   Special Requests   Final    BOTTLES DRAWN AEROBIC AND ANAEROBIC Blood Culture adequate volume   Culture  Setup Time   Final    IN BOTH AEROBIC AND ANAEROBIC BOTTLES GRAM POSITIVE COCCI CRITICAL VALUE NOTED.  VALUE IS CONSISTENT WITH PREVIOUSLY REPORTED AND CALLED VALUE.    Culture (A)  Final  STAPHYLOCOCCUS AUREUS SUSCEPTIBILITIES PERFORMED ON PREVIOUS CULTURE WITHIN THE LAST 5  DAYS. Performed at [facility], [place] [address], [place], [place] [address]    Report Status 07/08/2019 FINAL  Final  Blood Culture ID Panel (Reflexed)     Status: Abnormal   Collection Time: 07/06/19  4:30 AM  Result Value Ref Range Status   Enterococcus species NOT DETECTED NOT DETECTED Final   Listeria monocytogenes NOT DETECTED NOT DETECTED Final   Staphylococcus species DETECTED (A) NOT DETECTED Final    Comment: CRITICAL RESULT CALLED TO, READ BACK BY AND VERIFIED WITH: J MILLEN PHARMD 07/06/19 2058 JDW    Staphylococcus aureus (BCID) DETECTED (A) NOT DETECTED Final    Comment: Methicillin (oxacillin) susceptible Staphylococcus aureus (MSSA). Preferred therapy is anti staphylococcal beta lactam antibiotic (Cefazolin or Nafcillin), unless clinically contraindicated. CRITICAL RESULT CALLED TO, READ BACK BY AND VERIFIED WITH: J MILLEN PHARMD 07/06/19 2058 JDW    Methicillin resistance NOT DETECTED NOT DETECTED Final   Streptococcus species NOT DETECTED NOT DETECTED Final   Streptococcus agalactiae NOT DETECTED NOT DETECTED Final   Streptococcus pneumoniae NOT DETECTED NOT DETECTED Final   Streptococcus pyogenes NOT DETECTED NOT DETECTED Final   Acinetobacter baumannii NOT DETECTED NOT DETECTED Final   Enterobacteriaceae species NOT DETECTED NOT DETECTED Final   Enterobacter cloacae complex NOT DETECTED NOT DETECTED Final   Escherichia coli NOT DETECTED NOT DETECTED Final   Klebsiella oxytoca NOT DETECTED NOT DETECTED Final   Klebsiella pneumoniae NOT DETECTED NOT DETECTED Final   Proteus species NOT DETECTED NOT DETECTED Final   Serratia marcescens NOT DETECTED NOT DETECTED Final   Haemophilus influenzae NOT DETECTED NOT DETECTED Final   Neisseria meningitidis NOT DETECTED NOT DETECTED Final   Pseudomonas aeruginosa NOT DETECTED NOT DETECTED Final   Candida albicans NOT DETECTED NOT DETECTED Final   Candida glabrata NOT DETECTED NOT DETECTED Final   Candida krusei NOT  DETECTED NOT DETECTED Final   Candida parapsilosis NOT DETECTED NOT DETECTED Final   Candida tropicalis NOT DETECTED NOT DETECTED Final    Comment: Performed at [facility], [place] [address], [place], [place] [address]  Urine culture     Status: Abnormal   Collection Time: 07/06/19  4:37 AM   Specimen: Urine, Random  Result Value Ref Range Status   Specimen Description URINE, RANDOM  Final   Special Requests   Final    NONE Performed at [facility], [place] [address] [place], [place] [address]    Culture (A)  Final    >=100,000 COLONIES/mL MULTIPLE SPECIES PRESENT, SUGGEST RECOLLECTION   Report Status 07/07/2019 FINAL  Final  SARS Coronavirus 2 [facility] order, Performed in [facility] hospital lab) Nasopharyngeal Nasopharyngeal Swab     Status: None   Collection Time: 07/06/19  8:04 AM   Specimen: Nasopharyngeal Swab  Result Value Ref Range Status   SARS Coronavirus 2 NEGATIVE NEGATIVE Final    Comment: (NOTE) If result is NEGATIVE SARS-CoV-2 target nucleic acids are NOT DETECTED. The SARS-CoV-2 RNA is generally detectable in upper and lower  respiratory specimens during the acute phase of infection. The lowest  concentration of SARS-CoV-2 viral copies this assay can detect is 250  copies / mL. A negative result does not preclude SARS-CoV-2 infection  and should not be used as the sole basis for treatment or other  patient management decisions.  A negative result may occur with  improper specimen collection / handling, submission of specimen other  than nasopharyngeal  swab, presence of viral mutation(s) within the  areas targeted by this assay, and inadequate number of viral copies  (<250 copies / mL). A negative result must be combined with clinical  observations, patient history, and epidemiological information. If result is POSITIVE SARS-CoV-2 target nucleic acids are DETECTED. The SARS-CoV-2 RNA is generally detectable in upper and lower  respiratory specimens dur  ing the acute phase of infection.  Positive  results are indicative of active infection with SARS-CoV-2.  Clinical  correlation with patient history and other diagnostic information is  necessary to determine patient infection status.  Positive results do  not rule out bacterial infection or co-infection with other viruses. If result is PRESUMPTIVE POSTIVE SARS-CoV-2 nucleic acids MAY BE PRESENT.   A presumptive positive result was obtained on the submitted specimen  and confirmed on repeat testing.  While 2019 novel coronavirus  (SARS-CoV-2) nucleic acids may be present in the submitted sample  additional confirmatory testing may be necessary for epidemiological  and / or clinical management purposes  to differentiate between  SARS-CoV-2 and other Sarbecovirus currently known to infect humans.  If clinically indicated additional testing with an alternate test  methodology [phone]) is advised. The SARS-CoV-2 RNA is generally  detectable in upper and lower respiratory sp ecimens during the acute  phase of infection. The expected result is Negative. Fact Sheet for Patients:  StrictlyIdeas.no Fact Sheet for Healthcare Providers: BankingDealers.co.za This test is not yet approved or cleared by the [place] FDA and has been authorized for detection and/or diagnosis of SARS-CoV-2 by FDA under an Emergency Use Authorization (EUA).  This EUA will remain in effect (meaning this test can be used) for the duration of the COVID-19 declaration under Section 564(b)(1) of the Act, 21 U.S.C. section 360bbb-3(b)(1), unless the authorization is terminated or revoked sooner. Performed at [facility], [place] [address], [place], [place] [address]   Aerobic/Anaerobic Culture (surgical/deep wound)     Status: None (Preliminary result)   Collection Time: 07/06/19  6:48 PM   Specimen: Abscess  Result Value Ref Range Status   Specimen Description  ABSCESS  Final   Special Requests PARASPINAL  Final   Gram Stain   Final    FEW WBC PRESENT, PREDOMINANTLY PMN ABUNDANT GRAM POSITIVE COCCI Performed at [facility], [place] [address], [place], [place] [address]    Culture   Final    ABUNDANT STAPHYLOCOCCUS AUREUS NO ANAEROBES ISOLATED; CULTURE IN PROGRESS FOR 5 DAYS    Report Status PENDING  Incomplete   Organism ID, Bacteria STAPHYLOCOCCUS AUREUS  Final      Susceptibility   Staphylococcus aureus - MIC*    CIPROFLOXACIN <=0.5 SENSITIVE Sensitive     ERYTHROMYCIN >=8 RESISTANT Resistant     GENTAMICIN <=0.5 SENSITIVE Sensitive     OXACILLIN <=0.25 SENSITIVE Sensitive     TETRACYCLINE >=16 RESISTANT Resistant     VANCOMYCIN 1 SENSITIVE Sensitive     TRIMETH/SULFA <=10 SENSITIVE Sensitive     CLINDAMYCIN RESISTANT Resistant     RIFAMPIN <=0.5 SENSITIVE Sensitive     Inducible Clindamycin POSITIVE Resistant     * ABUNDANT STAPHYLOCOCCUS AUREUS  Aerobic/Anaerobic Culture (surgical/deep wound)     Status: None (Preliminary result)   Collection Time: 07/09/19  8:32 AM   Specimen: Abscess  Result Value Ref Range Status   Specimen Description ABSCESS HIP RIGHT  Final   Special Requests NONE  Final   Gram Stain   Final    MODERATE WBC  PRESENT,BOTH PMN AND MONONUCLEAR ABUNDANT GRAM POSITIVE COCCI IN PAIRS IN CLUSTERS Performed at [facility], [place] [address], [place], [place] [address]    Culture PENDING  Incomplete   Report Status PENDING  Incomplete         Radiology Studies: No results found.      Scheduled Meds: . enoxaparin (LOVENOX) injection  0.5 mg/kg Subcutaneous Q24H  . gabapentin  300 mg Oral BID  . hydrALAZINE  25 mg Oral BID  . insulin aspart  0-15 Units Subcutaneous TID WC  . insulin NPH Human  15 Units Subcutaneous BID AC & HS  . sodium chloride flush  3 mL Intravenous Q12H   Continuous Infusions: . sodium chloride    .  ceFAZolin (ANCEF) IV 2 g (07/09/19 0144)     LOS: 3 days     Time spent: 35 mins.More than 50% of that time was spent in counseling and/or coordination of care.      Shelly Coss, MD [facility] Pager [phone]  If 7PM-7AM, please contact night-coverage [email or web address] Password [email or web address] 07/09/2019, 2:18 PM

## 2019-07-09 NOTE — Interval H&P Note (Signed)
History and Physical Interval Note:  07/09/2019 7:42 AM  Bradley Cox  has presented today for surgery, with the diagnosis of Right hip abscess.  The various methods of treatment have been discussed with the patient and family. After consideration of risks, benefits and other options for treatment, the patient has consented to  Procedure(s): INCISION AND DRAINAGE ABSCESS RIGHT HIP/PELVIS (Right) as a surgical intervention.  The patient's history has been reviewed, patient examined, no change in status, stable for surgery.  I have reviewed the patient's chart and labs.  Questions were answered to the patient's satisfaction.     Lennette Bihari P Haddix

## 2019-07-09 NOTE — Transfer of Care (Signed)
Immediate Anesthesia Transfer of Care Note  Patient: Bradley Cox  Procedure(s) Performed: INCISION AND DRAINAGE ABSCESS RIGHT HIP/PELVIS (Right )  Patient Location: PACU  Anesthesia Type:General  Level of Consciousness: awake, alert , pateint uncooperative and confused  Airway & Oxygen Therapy: Patient Spontanous Breathing and Patient connected to face mask oxygen  Post-op Assessment: Report given to RN, Post -op Vital signs reviewed and stable and Patient moving all extremities  Post vital signs: Reviewed and stable  Last Vitals:  Vitals Value Taken Time  BP    Temp    Pulse 106 07/09/19 0930  Resp 26 07/09/19 0930  SpO2 94 % 07/09/19 0930  Vitals shown include unvalidated device data.  Last Pain:  Vitals:   07/09/19 0615  TempSrc:   PainSc: 8       Patients Stated Pain Goal: 0 ([id number])  Complications: No apparent anesthesia complications

## 2019-07-09 NOTE — Progress Notes (Signed)
   Providing Compassionate, Quality Care - Together   Subjective: Patient recently back from I and D by Dr. Doreatha Martin. There was 500 mL removed from a large right-sided gluteal abscess, followed by neuroplasty of the right sciatic nerve. He is still somnolent from anesthesia. He did wake up long enough to tell me his pain was improved and he was tired.  Objective: Vital signs in last 24 hours: Temp:  [97 F (36.1 C)-99 F (37.2 C)] 99 F (37.2 C) (09/07 1125) Pulse Rate:  [91-110] 93 (09/07 1254) Resp:  [14-20] 20 (09/07 1254) BP: (115-143)/(69-84) 117/69 (09/07 1126) SpO2:  [93 %-100 %] 97 % (09/07 1254)  Intake/Output from previous day: 09/06 0701 - 09/07 0700 In: 74 [P.O.:542] Out: 6100 [Urine:6100] Intake/Output this shift: Total I/O In: 900 [I.V.:900] Out: 950 [Urine:825; Drains:75; Blood:50]  Alert and oriented x 4 PERRLA CN II-XII grossly intact 3+ pitting edema BLE MAE, RLE extremely limited due to pain. Unable to raise R leg off of bed secondary to pain, difficult to gauge strength. RLE with significant numbness. LLE hip flexor strength 4/5, hyperpathic in L foot   Lab Results: Recent Labs    07/08/19 1019 07/09/19 0248  WBC 6.3 5.9  HGB 10.7* 10.4*  HCT 31.6* 31.0*  PLT 172 168   BMET Recent Labs    07/08/19 1019 07/09/19 0248  NA 123* 124*  K 4.2 3.8  CL 86* 86*  CO2 27 28  GLUCOSE 307* 276*  BUN 7 6  CREATININE 0.70 0.55*  CALCIUM 8.0* 8.0*    Studies/Results: No results found.  Assessment/Plan: Patient admitted on 07/06/2019 with a large paraspinal abscess with extension towards the right L4-5 facet joint. There is a small amount of epidural fluid left of midline with a mild to moderate amount of stenosis. IR performed a CT guided aspiration of right paraspinal abscess. Surgical decompression not recommended per Dr. Saintclair Halsted. ID recommended cefazolin for MSSA.  Dr. Doreatha Martin removed 500 mL from a large right-sided gluteal abscess, then performed a  neuroplasty of the right sciatic nerve on 07/09/2019.   LOS: 3 days    -Continue antibiotics per ID   Viona Gilmore, DNP, AGNP-C Nurse Practitioner  [facility] [place] [address], [place], [place] [address] P: [phone]    F: [phone]  07/09/2019, 2:34 PM

## 2019-07-09 NOTE — Anesthesia Preprocedure Evaluation (Signed)
Anesthesia Evaluation  Patient identified by MRN, date of birth, ID bandGeneral Assessment Comment:Patient awake and responds to questions  Reviewed: Allergy & Precautions, NPO status , Patient's Chart, lab work & pertinent test results  Airway Mallampati: III  TM Distance: >3 FB Neck ROM: Full    Dental  (+) Missing   Pulmonary asthma , sleep apnea ,    Pulmonary exam normal breath sounds clear to auscultation       Cardiovascular hypertension, Normal cardiovascular exam Rhythm:Regular Rate:Normal  ECG: ST, rate 118  ECHO: 1. The left ventricle has normal systolic function, with an ejection fraction of 55-60%. The cavity size was normal. There is moderately increased left ventricular wall thickness. Left ventricular diastolic parameters were normal. 2. The right ventricle has normal systolic function. The cavity was normal. There is no increase in right ventricular wall thickness. 3. Mild thickening of the mitral valve leaflet. 4. The aortic valve was not well visualized. Mild thickening of the aortic valve. Mild calcification of the aortic valve. 5. The aorta is abnormal unless otherwise noted. 6. There is mild dilatation of the aortic root measuring 38 mm. 7. Poor quality images if suspiscion for SBE high consider TEE. 8. The interatrial septum was not well visualized.   Neuro/Psych negative neurological ROS     GI/Hepatic negative GI ROS, Neg liver ROS,   Endo/Other  diabetes, Oral Hypoglycemic AgentsMorbid obesityHyponatremia   Renal/GU negative Renal ROS     Musculoskeletal   Abdominal (+) + obese,   Peds  Hematology  (+) anemia ,   Anesthesia Other Findings Right hip abscess  Reproductive/Obstetrics                             Anesthesia Physical Anesthesia Plan  ASA: III  Anesthesia Plan: General   Post-op Pain Management:    Induction: Intravenous  PONV Risk Score and  Plan: 2 and Ondansetron and Treatment may vary due to age or medical condition  Airway Management Planned: Oral ETT  Additional Equipment:   Intra-op Plan:   Post-operative Plan: Extubation in OR  Informed Consent: I have reviewed the patients History and Physical, chart, labs and discussed the procedure including the risks, benefits and alternatives for the proposed anesthesia with the patient or authorized representative who has indicated his/her understanding and acceptance.     Dental advisory given  Plan Discussed with: CRNA  Anesthesia Plan Comments:         Anesthesia Quick Evaluation

## 2019-07-09 NOTE — Anesthesia Procedure Notes (Signed)
Procedure Name: Intubation Date/Time: 07/09/2019 8:02 AM Performed by: Myna Bright, CRNA Pre-anesthesia Checklist: Patient identified, Emergency Drugs available, Suction available and Patient being monitored Patient Re-evaluated:Patient Re-evaluated prior to induction Oxygen Delivery Method: Circle system utilized Preoxygenation: Pre-oxygenation with 100% oxygen Induction Type: IV induction Ventilation: Unable to mask ventilate Laryngoscope Size: Mac and 4 Grade View: Grade I Tube type: Oral Tube size: 7.5 mm Number of attempts: 1 Airway Equipment and Method: Stylet Placement Confirmation: ETT inserted through vocal cords under direct vision,  positive ETCO2 and breath sounds checked- equal and bilateral Secured at: 22 cm Tube secured with: Tape Dental Injury: Teeth and Oropharynx as per pre-operative assessment  Comments: Unable to mask ventilate d/t full beard. Succinylcholine used, DLx1 with Grade I view.

## 2019-07-10 ENCOUNTER — Encounter (HOSPITAL_COMMUNITY): Payer: Self-pay | Admitting: Student

## 2019-07-10 DIAGNOSIS — R21 Rash and other nonspecific skin eruption: Secondary | ICD-10-CM

## 2019-07-10 DIAGNOSIS — B9561 Methicillin susceptible Staphylococcus aureus infection as the cause of diseases classified elsewhere: Secondary | ICD-10-CM

## 2019-07-10 DIAGNOSIS — Z978 Presence of other specified devices: Secondary | ICD-10-CM

## 2019-07-10 DIAGNOSIS — R7881 Bacteremia: Secondary | ICD-10-CM

## 2019-07-10 DIAGNOSIS — M25551 Pain in right hip: Secondary | ICD-10-CM

## 2019-07-10 LAB — BASIC METABOLIC PANEL
Anion gap: 12 (ref 5–15)
BUN: 5 mg/dL — ABNORMAL LOW (ref 6–20)
CO2: 27 mmol/L (ref 22–32)
Calcium: 7.9 mg/dL — ABNORMAL LOW (ref 8.9–10.3)
Chloride: 88 mmol/L — ABNORMAL LOW (ref 98–111)
Creatinine, Ser: 0.69 mg/dL (ref 0.61–1.24)
GFR calc Af Amer: >60 mL/min (ref >60)
GFR calc non Af Amer: >60 mL/min (ref >60)
Glucose, Bld: 312 mg/dL — ABNORMAL HIGH (ref 70–99)
Potassium: 3.9 mmol/L (ref 3.5–5.1)
Sodium: 127 mmol/L — ABNORMAL LOW (ref 135–145)

## 2019-07-10 LAB — CBC
HCT: 31 % — ABNORMAL LOW (ref 39.0–52.0)
Hemoglobin: 10.2 g/dL — ABNORMAL LOW (ref 13.0–17.0)
MCH: 29.9 pg (ref 26.0–34.0)
MCHC: 32.9 g/dL (ref 30.0–36.0)
MCV: 90.9 fL (ref 80.0–100.0)
Platelets: 216 10*3/uL (ref 150–400)
RBC: 3.41 MIL/uL — ABNORMAL LOW (ref 4.22–5.81)
RDW: 12.5 % (ref 11.5–15.5)
WBC: 8.6 10*3/uL (ref 4.0–10.5)
nRBC: 0.2 % (ref 0.0–0.2)

## 2019-07-10 LAB — GLUCOSE, CAPILLARY
Glucose-Capillary: 302 mg/dL — ABNORMAL HIGH (ref 70–99)
Glucose-Capillary: 259 mg/dL — ABNORMAL HIGH (ref 70–99)
Glucose-Capillary: 287 mg/dL — ABNORMAL HIGH (ref 70–99)
Glucose-Capillary: 313 mg/dL — ABNORMAL HIGH (ref 70–99)
Glucose-Capillary: 325 mg/dL — ABNORMAL HIGH (ref 70–99)

## 2019-07-10 MED ORDER — INSULIN NPH (HUMAN) (ISOPHANE) 100 UNIT/ML ~~LOC~~ SUSP
20.0000 [IU] | Freq: Two times a day (BID) | SUBCUTANEOUS | Status: DC
  Administered 2019-07-10 – 2019-07-14 (×6): 20 [IU] via SUBCUTANEOUS
  Filled 2019-07-10: qty 10

## 2019-07-10 MED ORDER — GABAPENTIN 300 MG PO CAPS
300.0000 mg | ORAL_CAPSULE | Freq: Three times a day (TID) | ORAL | Status: DC
  Administered 2019-07-10 – 2019-08-16 (×107): 300 mg via ORAL
  Filled 2019-07-10 (×108): qty 1

## 2019-07-10 MED ORDER — METRONIDAZOLE IN NACL 5-0.79 MG/ML-% IV SOLN
500.0000 mg | Freq: Three times a day (TID) | INTRAVENOUS | Status: DC
  Administered 2019-07-10 – 2019-07-13 (×9): 500 mg via INTRAVENOUS
  Filled 2019-07-10 (×9): qty 100

## 2019-07-10 MED ORDER — METHOCARBAMOL 500 MG PO TABS
500.0000 mg | ORAL_TABLET | Freq: Four times a day (QID) | ORAL | Status: DC | PRN
  Administered 2019-07-10 – 2019-07-17 (×6): 500 mg via ORAL
  Filled 2019-07-10 (×6): qty 1

## 2019-07-10 MED ORDER — FENTANYL CITRATE (PF) 100 MCG/2ML IJ SOLN
50.0000 ug | INTRAMUSCULAR | Status: DC | PRN
  Administered 2019-07-14 – 2019-07-19 (×8): 75 ug via INTRAVENOUS
  Administered 2019-07-19: 50 ug via INTRAVENOUS
  Administered 2019-07-20 – 2019-07-24 (×12): 75 ug via INTRAVENOUS
  Administered 2019-07-25 – 2019-07-26 (×4): 50 ug via INTRAVENOUS
  Administered 2019-07-26 (×3): 75 ug via INTRAVENOUS
  Administered 2019-07-27: 50 ug via INTRAVENOUS
  Administered 2019-07-27 – 2019-07-29 (×6): 75 ug via INTRAVENOUS
  Administered 2019-07-29: 50 ug via INTRAVENOUS
  Filled 2019-07-10 (×38): qty 2

## 2019-07-10 MED ORDER — OXYCODONE HCL 5 MG PO TABS
5.0000 mg | ORAL_TABLET | ORAL | Status: DC | PRN
  Administered 2019-07-10 – 2019-07-15 (×13): 10 mg via ORAL
  Administered 2019-07-15: 5 mg via ORAL
  Administered 2019-07-15 – 2019-07-18 (×7): 10 mg via ORAL
  Filled 2019-07-10: qty 2
  Filled 2019-07-10: qty 1
  Filled 2019-07-10 (×19): qty 2

## 2019-07-10 MED ORDER — INSULIN ASPART 100 UNIT/ML ~~LOC~~ SOLN
0.0000 [IU] | Freq: Every day | SUBCUTANEOUS | Status: DC
  Administered 2019-07-10: 4 [IU] via SUBCUTANEOUS

## 2019-07-10 MED ORDER — KETOROLAC TROMETHAMINE 15 MG/ML IJ SOLN
15.0000 mg | Freq: Four times a day (QID) | INTRAMUSCULAR | Status: AC
  Administered 2019-07-10 – 2019-07-11 (×5): 15 mg via INTRAVENOUS
  Filled 2019-07-10 (×5): qty 1

## 2019-07-10 MED ORDER — ACETAMINOPHEN 325 MG PO TABS
650.0000 mg | ORAL_TABLET | Freq: Four times a day (QID) | ORAL | Status: DC
  Administered 2019-07-10 – 2019-07-18 (×25): 650 mg via ORAL
  Filled 2019-07-10 (×25): qty 2

## 2019-07-10 MED ORDER — ACETAMINOPHEN 650 MG RE SUPP
650.0000 mg | Freq: Four times a day (QID) | RECTAL | Status: DC
  Filled 2019-07-10 (×2): qty 1

## 2019-07-10 NOTE — Progress Notes (Signed)
Orthopaedic Trauma Progress Note  S: Doing okay this morning, had difficult time sleeping last night. Having a hard time getting comfortable in bed. Notes improvement in gluteal pain from pre-operatively but now having different type of pain in the area. Describes it as a deep, throbbing pain. Continues to note a significantly painful area located behind his knee which was present prior to surgery. Concerned that he is unable to lift his leg off the bed. Denies any numbness or tingling distally. Intraoperative cultures grew staphylococcus aureus, susceptibilities to follow.   O:  Vitals:   07/10/19 0000 07/10/19 0738  BP: 134/82 (!) 117/59  Pulse: (!) 112 (!) 107  Resp: 13 (!) 22  Temp:  97.7 F (36.5 C)  SpO2: 95% 92%    General - Sitting up in bed, NAD. Uncomfortable with movemene  Right Lower Extremity - Dressing clean, dry, intact. Hemovac in place with good function. Tender with palpation around incision, less tender in thigh or lower leg with exception of one extremely painful area location on proximal calf/behind knee. Due to location, difficult to inspect area but area felt firm and was exquisitely tender. Unable to raise leg off of bed secondary to pain. Unable to active flex knee, does not tolerate passive movement. Able to actively dorsiflex/plantarflex ankle. Wiggles toes. 2+ DP pulse   Labs:  Results for orders placed or performed during the hospital encounter of 07/05/19 (from the past 24 hour(s))  Aerobic/Anaerobic Culture (surgical/deep wound)     Status: None (Preliminary result)   Collection Time: 07/09/19  8:32 AM   Specimen: Abscess  Result Value Ref Range   Specimen Description ABSCESS HIP RIGHT    Special Requests NONE    Gram Stain      MODERATE WBC PRESENT,BOTH PMN AND MONONUCLEAR ABUNDANT GRAM POSITIVE COCCI IN PAIRS IN CLUSTERS    Culture      FEW STAPHYLOCOCCUS AUREUS SUSCEPTIBILITIES TO FOLLOW Performed at [facility], [place] [address],  [place], [place] [address]    Report Status PENDING   Glucose, capillary     Status: Abnormal   Collection Time: 07/09/19  9:30 AM  Result Value Ref Range   Glucose-Capillary 229 (H) 70 - 99 mg/dL  Glucose, capillary     Status: Abnormal   Collection Time: 07/09/19 12:43 PM  Result Value Ref Range   Glucose-Capillary 254 (H) 70 - 99 mg/dL  Culture, blood (routine x 2)     Status: None (Preliminary result)   Collection Time: 07/09/19 12:57 PM   Specimen: BLOOD RIGHT HAND  Result Value Ref Range   Specimen Description BLOOD RIGHT HAND    Special Requests      AEROBIC BOTTLE ONLY Blood Culture results may not be optimal due to an inadequate volume of blood received in culture bottles   Culture      NO GROWTH < 24 HOURS Performed at [facility], [place] [address], [place], [place] [address]    Report Status PENDING   Culture, blood (routine x 2)     Status: None (Preliminary result)   Collection Time: 07/09/19  1:06 PM   Specimen: BLOOD RIGHT HAND  Result Value Ref Range   Specimen Description BLOOD RIGHT HAND    Special Requests AEROBIC BOTTLE ONLY Blood Culture adequate volume    Culture      NO GROWTH < 24 HOURS Performed at [facility], [place] [address], [place], [place] [address]    Report Status PENDING  Glucose, capillary     Status: Abnormal   Collection Time: 07/09/19  4:59 PM  Result Value Ref Range   Glucose-Capillary 247 (H) 70 - 99 mg/dL   Comment 1 Notify RN    Comment 2 Document in Chart   Glucose, capillary     Status: Abnormal   Collection Time: 07/09/19  9:44 PM  Result Value Ref Range   Glucose-Capillary 248 (H) 70 - 99 mg/dL  CBC     Status: Abnormal   Collection Time: 07/10/19  5:21 AM  Result Value Ref Range   WBC 8.6 4.0 - 10.5 K/uL   RBC 3.41 (L) 4.22 - 5.81 MIL/uL   Hemoglobin 10.2 (L) 13.0 - 17.0 g/dL   HCT 31.0 (L) 39.0 - 52.0 %   MCV 90.9 80.0 - 100.0 fL   MCH 29.9 26.0 - 34.0 pg   MCHC 32.9 30.0 - 36.0 g/dL   RDW 12.5 11.5 - 15.5 %    Platelets 216 150 - 400 K/uL   nRBC 0.2 0.0 - 0.2 %  Basic metabolic panel     Status: Abnormal   Collection Time: 07/10/19  5:21 AM  Result Value Ref Range   Sodium 127 (L) 135 - 145 mmol/L   Potassium 3.9 3.5 - 5.1 mmol/L   Chloride 88 (L) 98 - 111 mmol/L   CO2 27 22 - 32 mmol/L   Glucose, Bld 312 (H) 70 - 99 mg/dL   BUN 5 (L) 6 - 20 mg/dL   Creatinine, Ser 0.69 0.61 - 1.24 mg/dL   Calcium 7.9 (L) 8.9 - 10.3 mg/dL   GFR calc non Af Amer >60 >60 mL/min   GFR calc Af Amer >60 >60 mL/min   Anion gap 12 5 - 15  Glucose, capillary     Status: Abnormal   Collection Time: 07/10/19  7:40 AM  Result Value Ref Range   Glucose-Capillary 302 (H) 70 - 99 mg/dL    Assessment: 48 year old male s/p I&D of right hip/pelvic abscess   Weightbearing: WBAT RLE  Insicional and dressing care: dressing c/d/i, will change tomorrow and likely pull drain  Orthopedic device(s): None  CV/Blood loss: Hgb stable at 10.2 this AM. Hemodynamically stable  Pain management: 1. Tylenol 650 mg q 6 hours scheduled 2. Robaxin 500 mg q 6 hours PRN 3. Oxycodone 5-10 mg q 4 hours PRN 4. Neurontin 300 mg TID 5. Fentanyl 50-75 mcg q 4 hours PRN 6. Toradol 15 mg q 6 hours x 5 doses  VTE prophylaxis: Lovenox per primary team  ID: Continue per ID  Foley/Lines:  No foley, KVO IVFs  Medical co-morbidities: DM Type II   Dispo: Up with therapy, continue antibiotics per ID  Follow - up plan: 2 weeks for suture removal and wound check    Amaiah Cristiano A. [facility] ?[phone]? (phone)

## 2019-07-10 NOTE — Progress Notes (Addendum)
Patient ID: Bradley Cox, male   DOB: [birth year], 48 y.o.   MRN: [id number] The patient remains proximal right lower extremity weakness still intact strength distally negative left lower extremity symptoms   Patient is status post I and D of right hip and psoas abscess.  continue IV antibiotics and aggressive medical management.  Exam consistent with peripheral nerve and lumbar plexus involvement.

## 2019-07-10 NOTE — Evaluation (Signed)
Occupational Therapy Evaluation Patient Details Name: Bradley Cox MRN: [id number] DOB: 01/27/1971 Today's Date: 07/10/2019    History of Present Illness Patient is a 48 y/o male who presents with right flank pain. Admitted with disseminated MSSA infection including bacteremia, Lumbosacral spinal epidural abscess, paraspinal/large gluteal/pelvic abscess with sciatica impingement. s/p I&D large gluteal abscess and neuroplasty of sciatica nerve 9/7. s/p CT aspiration right paraspinal abscess. PMH includes DM, HTN, morbid obesity, OSA.   Clinical Impression   PTA, pt was living with his son and was independent and working as a Conservation officer, historic buildings. Within last three weeks, pt has required assistance for ADLs due to weakness and pain. Pt currently requires Mod A for UB ADLs, Max-Total A for LB ADLs, and Mod A +2 for sit<>stand at EOB. Pt presenting with poor balance, strength, and activity tolerance due to pain. Despite pain, pt agreeable to participate in therapy. Pt will require further acute OT to facilitate safe dc. Recommend dc to CIR for intensive OT to optimize safety, independence with ADLs, and return to PLOF.      Follow Up Recommendations  CIR;Supervision/Assistance - 24 hour    Equipment Recommendations  Other (comment)(Defer to next venue)    Recommendations for Other Services PT consult     Precautions / Restrictions Precautions Precautions: Fall Precaution Comments: hemovac Restrictions Weight Bearing Restrictions: Yes RLE Weight Bearing: Weight bearing as tolerated      Mobility Bed Mobility Overal bed mobility: Needs Assistance Bed Mobility: Supine to Sit;Sit to Sidelying;Rolling Rolling: Max assist;+2 for physical assistance   Supine to sit: Max assist;+2 for physical assistance;HOB elevated   Sit to sidelying: Max assist;+2 for physical assistance;HOB elevated General bed mobility comments: Max A +2 to bring RLE towards EOB and then elevate trunk.  Assist to bring  BLEs to return to supine.  Transfers Overall transfer level: Needs assistance Equipment used: (back of recliner) Transfers: Sit to/from Stand Sit to Stand: Mod assist;+2 physical assistance;From elevated surface         General transfer comment: Assist of 2 to power to standing with pt pulling up on back of chair. Wide BoS, flexed trunk. Increased pain.    Balance Overall balance assessment: Needs assistance Sitting-balance support: Feet supported;Bilateral upper extremity supported Sitting balance-Leahy Scale: Fair Sitting balance - Comments: Requires BUE support as position of comfort. Offloading right hip.   Standing balance support: During functional activity Standing balance-Leahy Scale: Poor Standing balance comment: Requires UE support on chair for support.                           ADL either performed or assessed with clinical judgement   ADL Overall ADL's : Needs assistance/impaired Eating/Feeding: Set up;Bed level   Grooming: Set up;Bed level;Supervision/safety   Upper Body Bathing: Moderate assistance;Sitting   Lower Body Bathing: Maximal assistance;Sit to/from stand;+2 for physical assistance;Bed level   Upper Body Dressing : Moderate assistance;Sitting   Lower Body Dressing: Total assistance;Bed level Lower Body Dressing Details (indicate cue type and reason): Donned socks             Functional mobility during ADLs: Moderate assistance;+2 for physical assistance;+2 for safety/equipment(sit<>stand) General ADL Comments: Pt limited by pain and tolerating sitting at EOB and then performing sit<>Stand. Requiring increased assistance for ADLs     Vision         Perception     Praxis      Pertinent Vitals/Pain Pain Assessment: Faces Faces Pain  Scale: Hurts worst Pain Location: Butt and RLE esp with movement Pain Descriptors / Indicators: Constant;Discomfort;Grimacing;Moaning Pain Intervention(s): Monitored during session;Limited  activity within patient's tolerance;Repositioned     Hand Dominance Right   Extremity/Trunk Assessment Upper Extremity Assessment Upper Extremity Assessment: Overall WFL for tasks assessed   Lower Extremity Assessment Lower Extremity Assessment: Defer to PT evaluation RLE Deficits / Details: Limited AROM throughout secondary to pain. Limited knee flexion. Able to perform quad set and ankle AROM WFL. Swelling throughout limb RLE: Unable to fully assess due to pain   Cervical / Trunk Assessment Cervical / Trunk Assessment: Other exceptions Cervical / Trunk Exceptions: Increased body habitus   Communication Communication Communication: No difficulties   Cognition Arousal/Alertness: Awake/alert Behavior During Therapy: WFL for tasks assessed/performed Overall Cognitive Status: Within Functional Limits for tasks assessed                                 General Comments: Focused on anticipation of pain   General Comments  VSS throughout. SpO2 dropping to 87% on RA with increased pain    Exercises     Shoulder Instructions      Home Living Family/patient expects to be discharged to:: Private residence Living Arrangements: Parent(mom can check on him) Available Help at Discharge: Family;Available PRN/intermittently Type of Home: House Home Access: Stairs to enter [employer] of Steps: 3-4 Entrance Stairs-Rails: Right Home Layout: One level     Bathroom Shower/Tub: Occupational psychologist: Standard     Home Equipment: None          Prior Functioning/Environment Level of Independence: Independent        Comments: Truck driver.        OT Problem List: Decreased strength;Decreased range of motion;Decreased activity tolerance;Impaired balance (sitting and/or standing);Decreased knowledge of use of DME or AE;Decreased knowledge of precautions;Decreased safety awareness;Pain;Increased edema      OT Treatment/Interventions:  Self-care/ADL training;Therapeutic exercise;Energy conservation;DME and/or AE instruction;Therapeutic activities;Patient/family education    OT Goals(Current goals can be found in the care plan section) Acute Rehab OT Goals Patient Stated Goal: to get back home OT Goal Formulation: With patient Time For Goal Achievement: 07/24/19 Potential to Achieve Goals: Good  OT Frequency: Min 3X/week   Barriers to D/C:            Co-evaluation PT/OT/SLP Co-Evaluation/Treatment: Yes Reason for Co-Treatment: For patient/therapist safety;To address functional/ADL transfers   OT goals addressed during session: ADL's and self-care      AM-PAC OT "6 Clicks" Daily Activity     Outcome Measure Help from another person eating meals?: None Help from another person taking care of personal grooming?: A Little Help from another person toileting, which includes using toliet, bedpan, or urinal?: A Lot Help from another person bathing (including washing, rinsing, drying)?: A Lot Help from another person to put on and taking off regular upper body clothing?: A Lot Help from another person to put on and taking off regular lower body clothing?: Total 6 Click Score: 14   End of Session Nurse Communication: Mobility status;Other (comment)(IV on left hand needs retaping)  Activity Tolerance: Patient limited by pain;Patient limited by fatigue Patient left: in bed;with call bell/phone within reach  OT Visit Diagnosis: Unsteadiness on feet (R26.81);Other abnormalities of gait and mobility (R26.89);Muscle weakness (generalized) (M62.81);Other symptoms and signs involving cognitive function;Pain Pain - part of body: (Back)  Time: [id number] OT Time Calculation (min): 43 min Charges:  OT General Charges $OT Visit: 1 Visit OT Evaluation $OT Eval Moderate Complexity: June Park, OTR/L Acute Rehab Pager: [phone] Office: [place] 07/10/2019, 1:45 PM

## 2019-07-10 NOTE — Progress Notes (Signed)
Rehab Admissions Coordinator Note:  Patient was screened by Cleatrice Burke for appropriateness for an Inpatient Acute Rehab Consult per PT recs. .  At this time, we are recommending Inpatient Rehab consult if pt would like to be considered for admit. Please advise.  Cleatrice Burke RN MSN 07/10/2019, 1:25 PM  I can be reached at [phone].

## 2019-07-10 NOTE — Progress Notes (Signed)
    [facility] has been requested to perform a transesophageal echocardiogram on 07/11/19 for Bacteremia.  After careful review of history and examination, the risks and benefits of transesophageal echocardiogram have been explained including risks of esophageal damage, perforation (1:10,000 risk), bleeding, pharyngeal hematoma as well as other potential complications associated with conscious sedation including aspiration, arrhythmia, respiratory failure and death. Alternatives to treatment were discussed, questions were answered. Patient is willing to proceed. Labs and vital signs stable.   Reino Bellis, NP-C 07/10/2019 3:05 PM

## 2019-07-10 NOTE — Consult Note (Signed)
Physical Medicine and Rehabilitation Consult Reason for Consult: Decreased functional mobility Referring Physician: [facility]   HPI: Bradley Cox is a 48 y.o. right-handed male with history of hypertension, diabetes mellitus, morbid obesity, OSA.  Per chart review lives with son ans mother in law.  1 level home 3 steps to entry.  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  Presented 07/06/2019 with complaints of right flank pain x3 weeks and recently seen in urgent care.  Patient did recently receive a shot of Toradol to the right buttocks as well as steroid Dosepak.  Patient reports low back pain radiating to the right gluteus/thigh area with progressive decline in right lower extremity strength difficulty ambulation.  Patient noted to have low-grade fever as well as tachycardia, tachypneic.  Blood glucose was elevated with increased anion gap.  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  Placed on broad-spectrum antibiotics.  Neurosurgery consulted did not feel needed surgical intervention and exam was consistent with peripheral nerve and lumbar plexus involvement.  Underwent IR guided guided paraspinal abscess drainage per Dr. Doreatha Martin and blood culture showing MSSA.  Infectious disease consulted and currently on antibiotic therapy for 6 to 8 weeks with TEE pending.  Subcutaneous Lovenox for DVT prophylaxis.  Therapy evaluations completed with recommendations of physical medicine rehab consult.   Review of Systems  Constitutional: Positive for fever.  HENT: Negative for hearing loss.   Eyes: Negative for blurred vision and double vision.  Respiratory: Positive for shortness of breath.   Cardiovascular: Positive for leg swelling. Negative for chest pain and palpitations.  Gastrointestinal: Positive for constipation. Negative for heartburn, nausea and vomiting.   Genitourinary: Positive for flank pain. Negative for dysuria and urgency.  Musculoskeletal: Positive for back pain and myalgias.  Neurological: Positive for sensory change and weakness.  All other systems reviewed and are negative.  Past Medical History:  Diagnosis Date  . Arthritis   . Asthma   . Diabetes mellitus, type 2 ([place])   . Hypertension   . Morbid obesity ([place])   . OSA (obstructive sleep apnea)    Past Surgical History:  Procedure Laterality Date  . HERNIA REPAIR     As a baby  . INCISION AND DRAINAGE ABSCESS Right 07/09/2019   Procedure: INCISION AND DRAINAGE ABSCESS RIGHT HIP/PELVIS;  Surgeon: Shona Needles, MD;  Location: [facility];  Service: Orthopedics;  Laterality: Right;   Family History  Problem Relation Age of Onset  . Diabetes Paternal Grandmother    Social History:  reports that he has never smoked. His smokeless tobacco use includes snuff. He reports current alcohol use. He reports that he does not use drugs. Allergies:  Allergies  Allergen Reactions  . Penicillins Anaphylaxis    Did it involve swelling of the face/tongue/throat, SOB, or low BP? Yes Did it involve sudden or severe rash/hives, skin peeling, or any reaction on the inside of your mouth or nose? No Did you need to seek medical attention at a hospital or doctor's office? Yes When did it last happen?4 months old If all above answers are "NO", may proceed with cephalosporin use.   . Tramadol Anaphylaxis   Medications Prior to Admission  Medication Sig Dispense Refill  . albuterol (VENTOLIN HFA) 108 (90 Base) MCG/ACT inhaler Inhale 1 puff into the lungs every 6 (six) hours as needed for wheezing or shortness of breath.     [name] Kitchen  hydrALAZINE (APRESOLINE) 50 MG tablet Take 50 mg by mouth 2 (two) times daily.    [name] Kitchen ibuprofen (ADVIL) 200 MG tablet Take 600 mg by mouth every 6 (six) hours as needed for headache or moderate pain.    . metFORMIN (GLUCOPHAGE) 500 MG tablet Take 1 tablet (500 mg total) by  mouth 2 (two) times daily with a meal. 60 tablet 0  . Multiple Vitamin (THERA) TABS Take 2 tablets by mouth daily.    . blood glucose meter kit and supplies KIT Dispense based on patient and insurance preference. Use up to four times daily as directed. (FOR ICD-9 250.00, 250.01). 1 each 0    Home: Home Living Family/patient expects to be discharged to:: Private residence Living Arrangements: Parent(mom can check on him) Available Help at Discharge: Family, Available PRN/intermittently Type of Home: House Home Access: Stairs to enter [employer] of Steps: 3-4 Entrance Stairs-Rails: Right Home Layout: One level Bathroom Shower/Tub: Multimedia programmer: Standard Home Equipment: None  Functional History: Prior Function Level of Independence: Independent Comments: Truck driver. Functional Status:  Mobility: Bed Mobility Overal bed mobility: Needs Assistance Bed Mobility: Supine to Sit, Sit to Sidelying, Rolling Rolling: Max assist, +2 for physical assistance Supine to sit: Max assist, +2 for physical assistance, HOB elevated Sit to sidelying: Max assist, +2 for physical assistance, HOB elevated General bed mobility comments: Max A +2 to bring RLE towards EOB and then elevate trunk.  Assist to bring BLEs to return to supine. Transfers Overall transfer level: Needs assistance Equipment used: (back of recliner) Transfers: Sit to/from Stand Sit to Stand: Mod assist, +2 physical assistance, From elevated surface General transfer comment: Assist of 2 to power to standing with pt pulling up on back of chair. Wide BoS, flexed trunk. Increased pain. Ambulation/Gait General Gait Details: Unable    ADL: ADL Overall ADL's : Needs assistance/impaired Eating/Feeding: Set up, Bed level Grooming: Set up, Bed level, Supervision/safety Upper Body Bathing: Moderate assistance, Sitting Lower Body Bathing: Maximal assistance, Sit to/from stand, +2 for physical assistance,  Bed level Upper Body Dressing : Moderate assistance, Sitting Lower Body Dressing: Total assistance, Bed level Lower Body Dressing Details (indicate cue type and reason): Donned socks Functional mobility during ADLs: Moderate assistance, +2 for physical assistance, +2 for safety/equipment(sit<>stand) General ADL Comments: Pt limited by pain and tolerating sitting at EOB and then performing sit<>Stand. Requiring increased assistance for ADLs  Cognition: Cognition Overall Cognitive Status: Within Functional Limits for tasks assessed Orientation Level: Oriented X4 Cognition Arousal/Alertness: Awake/alert Behavior During Therapy: WFL for tasks assessed/performed Overall Cognitive Status: Within Functional Limits for tasks assessed General Comments: Focused on anticipation of pain  Blood pressure 119/62, pulse 93, temperature 98.6 F (37 C), temperature source Oral, resp. rate 20, height '6\' 6"'  (1.981 m), weight (!) 164 kg, SpO2 96 %. Physical Exam  Neurological:  Patient is alert in no acute distress and oriented x3.  Follows commands.  [place].   General: No acute distress Mood and affect are appropriate Heart: Regular rate and rhythm no rubs murmurs or extra sounds Lungs: Clear to auscultation, breathing unlabored, no rales or wheezes Abdomen: Positive bowel sounds, soft nontender to palpation, nondistended Extremities: No clubbing, cyanosis, or edema Skin: No evidence of breakdown, no evidence of rash Neurologic: Cranial nerves II through XII intact, motor strength is 5/5 in bilateral deltoid, bicep, tricep, grip, 2- right and 4- left hip flexor, trace right and 4/5 left knee extensors, 4+ bilateral ankle dorsiflexor and plantar flexor  Sensory exam normal sensation to light touch  lower extremities Cerebellar exam normal finger to nose to finger Musculoskeletal: No pain with hip or knee joint range of motion on the right side or left side.  There is swelling of the thigh.   The use a drain at the anterior lateral aspect of the right thigh proximally.  Results for orders placed or performed during the hospital encounter of 07/05/19 (from the past 24 hour(s))  CBC     Status: Abnormal   Collection Time: 07/10/19  5:21 AM  Result Value Ref Range   WBC 8.6 4.0 - 10.5 K/uL   RBC 3.41 (L) 4.22 - 5.81 MIL/uL   Hemoglobin 10.2 (L) 13.0 - 17.0 g/dL   HCT 31.0 (L) 39.0 - 52.0 %   MCV 90.9 80.0 - 100.0 fL   MCH 29.9 26.0 - 34.0 pg   MCHC 32.9 30.0 - 36.0 g/dL   RDW 12.5 11.5 - 15.5 %   Platelets 216 150 - 400 K/uL   nRBC 0.2 0.0 - 0.2 %  Basic metabolic panel     Status: Abnormal   Collection Time: 07/10/19  5:21 AM  Result Value Ref Range   Sodium 127 (L) 135 - 145 mmol/L   Potassium 3.9 3.5 - 5.1 mmol/L   Chloride 88 (L) 98 - 111 mmol/L   CO2 27 22 - 32 mmol/L   Glucose, Bld 312 (H) 70 - 99 mg/dL   BUN 5 (L) 6 - 20 mg/dL   Creatinine, Ser 0.69 0.61 - 1.24 mg/dL   Calcium 7.9 (L) 8.9 - 10.3 mg/dL   GFR calc non Af Amer >60 >60 mL/min   GFR calc Af Amer >60 >60 mL/min   Anion gap 12 5 - 15  Glucose, capillary     Status: Abnormal   Collection Time: 07/10/19  7:40 AM  Result Value Ref Range   Glucose-Capillary 302 (H) 70 - 99 mg/dL  Glucose, capillary     Status: Abnormal   Collection Time: 07/10/19 11:03 AM  Result Value Ref Range   Glucose-Capillary 259 (H) 70 - 99 mg/dL  Glucose, capillary     Status: Abnormal   Collection Time: 07/10/19 12:57 PM  Result Value Ref Range   Glucose-Capillary 287 (H) 70 - 99 mg/dL   Comment 1 Notify RN    Comment 2 Document in Chart   Glucose, capillary     Status: Abnormal   Collection Time: 07/10/19  4:30 PM  Result Value Ref Range   Glucose-Capillary 313 (H) 70 - 99 mg/dL  Glucose, capillary     Status: Abnormal   Collection Time: 07/10/19  8:57 PM  Result Value Ref Range   Glucose-Capillary 325 (H) 70 - 99 mg/dL   No results found.   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 Does the need for close, 24 hr/day medical supervision in concert with the patient's rehab needs make it unreasonable for this patient to be served in a less intensive setting? Yes 2. Co-Morbidities requiring supervision/potential complications: Morbid obesity, diabetes, hypertension, right iliopsoas abscess 3. Due to bladder management, bowel management, safety, skin/wound care, disease management, medication administration, pain management and patient education, does the patient require 24 hr/day rehab nursing? Yes 4. Does the patient require coordinated care of a physician, rehab nurse, therapy disciplines of PT OT to address physical and functional deficits in the context of the above medical diagnosis(es)? Yes Addressing deficits in the following areas:  balance, endurance, locomotion, strength, transferring, bowel/bladder control, bathing, dressing, toileting and psychosocial support 5. Can the patient actively participate in an intensive therapy program of at least 3 hrs of therapy per day at least 5 days per week? Yes 6. The potential for patient to make measurable gains while on inpatient rehab is good 7. Anticipated functional outcomes upon discharge from inpatient rehab are supervision  with PT, supervision with OT, n/a with SLP. 8. Estimated rehab length of stay to reach the above functional goals is: 14 to 16 days 9. Anticipated discharge destination: Home 10. Overall Rehab/Functional Prognosis: good  RECOMMENDATIONS: This patient's condition is appropriate for continued rehabilitative care in the following setting: CIR Patient has agreed to participate in recommended program. Yes Note that insurance prior authorization may be required for reimbursement for recommended care.  Comment: Needs to complete cardiac work-up, needs to be off IV pain medication "I have personally performed a face to face diagnostic evaluation of this patient.  Additionally, I  have reviewed and concur with the physician assistant's documentation above." Charlett Blake M.D. [facility] FAAPM&R (Sports Med, Neuromuscular Med) Diplomate Am Board of Electrodiagnostic Med  Cathlyn Parsons, PA-C 07/11/2019

## 2019-07-10 NOTE — Progress Notes (Signed)
Inpatient Diabetes Program Recommendations  AACE/ADA: New Consensus Statement on Inpatient Glycemic Control (2015)  Target Ranges:  Prepandial:   less than 140 mg/dL      Peak postprandial:   less than 180 mg/dL (1-2 hours)      Critically ill patients:  140 - 180 mg/dL   Lab Results  Component Value Date   GLUCAP 302 (H) 07/10/2019   HGBA1C 12.6 (H) 07/07/2019    Review of Glycemic Control Results for KAEO, MCNAIR (MRN [id number]) as of 07/10/2019 09:31  Ref. Range 07/09/2019 09:30 07/09/2019 12:43 07/09/2019 16:59 07/09/2019 21:44 07/10/2019 07:40  Glucose-Capillary Latest Ref Range: 70 - 99 mg/dL 229 (H) 254 (H) 247 (H) 248 (H) 302 (H)   Diabetes history: DM 2 Outpatient Diabetes medications: Metformin 500 mg bid Current orders for Inpatient glycemic control:  NPH 15 units bid Novolog 0-15 units tid  A1c 12.6% on 9/5 No insurance  Inpatient Diabetes Program Recommendations:    Glucose trends still elevated, Glucose 302 mg/dl this am.   Consider increasing NPH to 20 units bid.  Consider adding Novolog 0-5 units hs scale.  Will allow 1 more day of recovery and pain control. Will show pt insulin tomorrow 9/9.  Thanks,  Tama Headings RN, MSN, BC-ADM Inpatient Diabetes Coordinator Team Pager [phone] (8a-5p)

## 2019-07-10 NOTE — Progress Notes (Signed)
PROGRESS NOTE    Bradley Cox  [id number] DOB: October 14, 1971 DOA: 07/05/2019 PCP: Simona Huh, NP   Brief Narrative:  Patient is a 48 year old male with history of obesity, asthma, hypertension, GERD, nephrolithiasis, diabetes mellitus who presented with low back pain radiating to the right gluteus/thigh ICU with progressive decline in the right lower extremity strength, difficulty ambulation over 2 to 3 weeks.  On presentation he was found to be febrile, tachycardic, tachypneic.  Blood glucose was elevated with increased anion gap.  Imagings showed multiple areas of infection with large abscess in the right iliopsoas muscle, right lgluteal muscle ,epidural abscess  at L3-S1.  Patient was started on broad-spectrum antibiotics, insulin for DKA.  Neurosurgery, ID consulted.  Patient underwent IR guided  paraspinal abscess drainage.  Blood cultures showed  MSSA.  Currently on cefazolin.Repeat blood cultures pending.  Plan for TEE tomorrow.   Assessment & Plan:   Principal Problem:   MSSA bacteremia Active Problems:   Obesity, Class III, BMI 40-49.9 (morbid obesity) ([place])   Essential hypertension   Sepsis ([place])   AKI (acute kidney injury) ([place])   Hyperkalemia   Abscess of right hip   Paraspinal abscess ([facility])   Compression of right sciatic nerve   Sepsis/MSSA bacteremia/L3-S1 epidural abscess/Abscess formation inthe right paraspinous muscle at the L4 level/right iliopsoas and gluteal abscess:  Currently on cefazolin.  ID following.  He underwent CT aspiration of right paraspinal abscess by IR.  Underwent I&D of right hip/pelvic abscess, neuroplasty of right sciatic nerve by orthopedics on 07/09/2019.  Blood cultures showing MSSA.Wound cultures showing the same.  He will need TEE.  TTE did not show any vegetation.  I have requested to cardiology for scheduling TEE.  Continue pain management. Repeat blood cultures have been sent.  ID planning for 6 to 8 weeks of IV antibiotics. PT/OT  evaluation done and recommended CIR. Neurosurgery also following.  DKA: Off insulin drip.  Transitioned to long-acting and sliding scale.  Hemoglobin A1c of 12.6. Diabetic coordinator  following.  Not on insulin at home.  On metformin at home.He will need insulin on discharge.  AKI: Resolved with IV hydration  Hypertension: Currently blood pressure stable.  Continue current medicines  Right lower extremity swelling: Secondary to inflammation/infection.  Doppler lower extremity did not show DVT.  Continue antibiotics  GERD: Continue PPI  Fatty liver:   Could be associated  with history of  diabetes, obesity.Monitor as an outpatient.  Morbid obesity: BMI 42.6.  Hyponatremia: Improving.Continue gentle IV fluids.  Will check BMP tomorrow.         DVT prophylaxis: Lovenox Code Status: Full Family Communication: None within the bedside Disposition Plan: CIR  after full work-up   Consultants: ID, orthopedics  Procedures: I&D  Antimicrobials:  Anti-infectives (From admission, onward)   Start     Dose/Rate Route Frequency Ordered Stop   07/09/19 0839  vancomycin (VANCOCIN) powder  Status:  Discontinued       As needed 07/09/19 0839 07/09/19 0935   07/09/19 0839  tobramycin (NEBCIN) powder  Status:  Discontinued       As needed 07/09/19 0840 07/09/19 0935   07/07/19 1830  ceFAZolin (ANCEF) IVPB 2g/100 mL premix     2 g 200 mL/hr over 30 Minutes Intravenous Every 8 hours 07/07/19 1333     07/07/19 0900  ceFAZolin (ANCEF) IVPB 2g/100 mL premix  Status:  Discontinued     2 g 200 mL/hr over 30 Minutes Intravenous Every 8 hours 07/07/19 0847 07/07/19  1333   07/06/19 2030  clindamycin (CLEOCIN) IVPB 600 mg  Status:  Discontinued     600 mg 100 mL/hr over 30 Minutes Intravenous Every 8 hours 07/06/19 2010 07/07/19 0847   07/06/19 1800  vancomycin (VANCOCIN) 1,500 mg in sodium chloride 0.9 % 500 mL IVPB  Status:  Discontinued     1,500 mg 250 mL/hr over 120 Minutes Intravenous Every  12 hours 07/06/19 0820 07/07/19 0847   07/06/19 1400  ceFEPIme (MAXIPIME) 2 g in sodium chloride 0.9 % 100 mL IVPB  Status:  Discontinued     2 g 200 mL/hr over 30 Minutes Intravenous Every 8 hours 07/06/19 0504 07/07/19 0847   07/06/19 1400  vancomycin (VANCOCIN) 2,000 mg in sodium chloride 0.9 % 500 mL IVPB  Status:  Discontinued     2,000 mg 250 mL/hr over 120 Minutes Intravenous Every 8 hours 07/06/19 0504 07/06/19 0818   07/06/19 0745  clindamycin (CLEOCIN) IVPB 600 mg  Status:  Discontinued     600 mg 100 mL/hr over 30 Minutes Intravenous  Once 07/06/19 0737 07/06/19 2029   07/06/19 0500  ceFEPIme (MAXIPIME) 2 g in sodium chloride 0.9 % 100 mL IVPB     2 g 200 mL/hr over 30 Minutes Intravenous  Once 07/06/19 0451 07/06/19 1335   07/06/19 0500  vancomycin (VANCOCIN) 2,500 mg in sodium chloride 0.9 % 500 mL IVPB     2,500 mg 250 mL/hr over 120 Minutes Intravenous  Once 07/06/19 0451 07/06/19 1335      Subjective:  Patient seen and examined the bedside this morning.  Hemodynamically stable.  He was participating with physical therapy.  Continues to complain of extreme pain on the right gluteal region on the surgical site.  No other active issues.  Objective: Vitals:   07/09/19 2332 07/10/19 0000 07/10/19 0738 07/10/19 1255  BP:  134/82 (!) 117/59 125/87  Pulse:  (!) 112 (!) 107 94  Resp:  13 (!) 22 20  Temp: 98.7 F (37.1 C)  97.7 F (36.5 C) 98.3 F (36.8 C)  TempSrc: Oral  Oral Oral  SpO2:  95% 92%   Weight:      Height:        Intake/Output Summary (Last 24 hours) at 07/10/2019 1412 Last data filed at 07/10/2019 1200 Gross per 24 hour  Intake 2361.57 ml  Output 3275 ml  Net -913.43 ml   Filed Weights   07/05/19 1807 07/06/19 2116  Weight: (!) 167.4 kg (!) 164 kg    Examination:   General exam: In mild to moderate distress due to pain, distress,morbidly obese HEENT:PERRL,Oral mucosa moist, Ear/Nose normal on gross exam Respiratory system: Bilateral equal air  entry, normal vesicular breath sounds, no wheezes or crackles  Cardiovascular system: S1 & S2 heard, RRR. No JVD, murmurs, rubs, gallops or clicks. Gastrointestinal system: Abdomen is nondistended, soft and nontender. No organomegaly or masses felt. Normal bowel sounds heard. Central nervous system: Alert and oriented.  Weakness of the right lower extremity  extremities: Surgical wound on the right hip, wound VAC Skin: No rashes, lesions or ulcers,no icterus ,no pallor   Data Reviewed: I have personally reviewed following labs and imaging studies  CBC: Recent Labs  Lab 07/06/19 0359 07/06/19 1738 07/07/19 0521 07/08/19 1019 07/09/19 0248 07/10/19 0521  WBC 10.4  --  6.7 6.3 5.9 8.6  NEUTROABS 8.6*  --   --   --   --   --   HGB 14.0 12.6* 11.1* 10.7* 10.4* 10.2*  HCT 42.6 37.0* 34.2* 31.6* 31.0* 31.0*  MCV 94.2  --  92.4 90.5 89.6 90.9  PLT 256  --  197 172 168 [id number]   Basic Metabolic Panel: Recent Labs  Lab 07/06/19 2213 07/07/19 0521 07/08/19 1019 07/09/19 0248 07/10/19 0521  NA 132* 127* 123* 124* 127*  K 4.8 4.1 4.2 3.8 3.9  CL 95* 92* 86* 86* 88*  CO2 26 24 27 28 27   GLUCOSE 107* 130* 307* 276* 312*  BUN 14 11 7 6  5*  CREATININE 0.75 0.64 0.70 0.55* 0.69  CALCIUM 8.4* 8.2* 8.0* 8.0* 7.9*   GFR: Estimated Creatinine Clearance: 190.2 mL/min (by C-G formula based on SCr of 0.69 mg/dL). Liver Function Tests: Recent Labs  Lab 07/06/19 0526  AST 25  ALT 19  ALKPHOS 84  BILITOT 1.0  PROT 7.7  ALBUMIN 1.9*   No results for input(s): LIPASE, AMYLASE in the last 168 hours. No results for input(s): AMMONIA in the last 168 hours. Coagulation Profile: No results for input(s): INR, PROTIME in the last 168 hours. Cardiac Enzymes: No results for input(s): CKTOTAL, CKMB, CKMBINDEX, TROPONINI in the last 168 hours. BNP (last 3 results) No results for input(s): PROBNP in the last 8760 hours. HbA1C: No results for input(s): HGBA1C in the last 72 hours. CBG: Recent  Labs  Lab 07/09/19 1659 07/09/19 2144 07/10/19 0740 07/10/19 1103 07/10/19 1257  GLUCAP 247* 248* 302* 259* 287*   Lipid Profile: No results for input(s): CHOL, HDL, LDLCALC, TRIG, CHOLHDL, LDLDIRECT in the last 72 hours. Thyroid Function Tests: No results for input(s): TSH, T4TOTAL, FREET4, T3FREE, THYROIDAB in the last 72 hours. Anemia Panel: No results for input(s): VITAMINB12, FOLATE, FERRITIN, TIBC, IRON, RETICCTPCT in the last 72 hours. Sepsis Labs: Recent Labs  Lab 07/06/19 0359 07/06/19 1445 07/06/19 2213  LATICACIDVEN 3.0* 2.2* 2.6*    Recent Results (from the past 240 hour(s))  Blood culture (routine x 2)     Status: Abnormal   Collection Time: 07/06/19  4:30 AM   Specimen: BLOOD  Result Value Ref Range Status   Specimen Description BLOOD SITE NOT SPECIFIED  Final   Special Requests   Final    BOTTLES DRAWN AEROBIC AND ANAEROBIC Blood Culture results may not be optimal due to an inadequate volume of blood received in culture bottles   Culture  Setup Time   Final    IN BOTH AEROBIC AND ANAEROBIC BOTTLES GRAM POSITIVE COCCI Organism ID to follow CRITICAL RESULT CALLED TO, READ BACK BY AND VERIFIED WITHDion Body [facility] 07/06/19 2058 JDW Performed at [facility], [place] [address] [place], [place] [address]    Culture STAPHYLOCOCCUS AUREUS (A)  Final   Report Status 07/08/2019 FINAL  Final   Organism ID, Bacteria STAPHYLOCOCCUS AUREUS  Final      Susceptibility   Staphylococcus aureus - MIC*    CIPROFLOXACIN <=0.5 SENSITIVE Sensitive     ERYTHROMYCIN >=8 RESISTANT Resistant     GENTAMICIN <=0.5 SENSITIVE Sensitive     OXACILLIN <=0.25 SENSITIVE Sensitive     TETRACYCLINE >=16 RESISTANT Resistant     VANCOMYCIN 1 SENSITIVE Sensitive     TRIMETH/SULFA <=10 SENSITIVE Sensitive     CLINDAMYCIN RESISTANT Resistant     RIFAMPIN <=0.5 SENSITIVE Sensitive     Inducible Clindamycin POSITIVE Resistant     * STAPHYLOCOCCUS AUREUS  Blood culture (routine x 2)      Status: Abnormal   Collection Time: 07/06/19  4:30 AM   Specimen: BLOOD  Result Value Ref Range Status   Specimen Description BLOOD SITE NOT SPECIFIED  Final   Special Requests   Final    BOTTLES DRAWN AEROBIC AND ANAEROBIC Blood Culture adequate volume   Culture  Setup Time   Final    IN BOTH AEROBIC AND ANAEROBIC BOTTLES GRAM POSITIVE COCCI CRITICAL VALUE NOTED.  VALUE IS CONSISTENT WITH PREVIOUSLY REPORTED AND CALLED VALUE.    Culture (A)  Final    STAPHYLOCOCCUS AUREUS SUSCEPTIBILITIES PERFORMED ON PREVIOUS CULTURE WITHIN THE LAST 5 DAYS. Performed at [facility], [place] [address], [place], [place] [address]    Report Status 07/08/2019 FINAL  Final  Blood Culture ID Panel (Reflexed)     Status: Abnormal   Collection Time: 07/06/19  4:30 AM  Result Value Ref Range Status   Enterococcus species NOT DETECTED NOT DETECTED Final   Listeria monocytogenes NOT DETECTED NOT DETECTED Final   Staphylococcus species DETECTED (A) NOT DETECTED Final    Comment: CRITICAL RESULT CALLED TO, READ BACK BY AND VERIFIED WITH: J MILLEN PHARMD 07/06/19 2058 JDW    Staphylococcus aureus (BCID) DETECTED (A) NOT DETECTED Final    Comment: Methicillin (oxacillin) susceptible Staphylococcus aureus (MSSA). Preferred therapy is anti staphylococcal beta lactam antibiotic (Cefazolin or Nafcillin), unless clinically contraindicated. CRITICAL RESULT CALLED TO, READ BACK BY AND VERIFIED WITH: J MILLEN PHARMD 07/06/19 2058 JDW    Methicillin resistance NOT DETECTED NOT DETECTED Final   Streptococcus species NOT DETECTED NOT DETECTED Final   Streptococcus agalactiae NOT DETECTED NOT DETECTED Final   Streptococcus pneumoniae NOT DETECTED NOT DETECTED Final   Streptococcus pyogenes NOT DETECTED NOT DETECTED Final   Acinetobacter baumannii NOT DETECTED NOT DETECTED Final   Enterobacteriaceae species NOT DETECTED NOT DETECTED Final   Enterobacter cloacae complex NOT DETECTED NOT DETECTED Final   Escherichia coli  NOT DETECTED NOT DETECTED Final   Klebsiella oxytoca NOT DETECTED NOT DETECTED Final   Klebsiella pneumoniae NOT DETECTED NOT DETECTED Final   Proteus species NOT DETECTED NOT DETECTED Final   Serratia marcescens NOT DETECTED NOT DETECTED Final   Haemophilus influenzae NOT DETECTED NOT DETECTED Final   Neisseria meningitidis NOT DETECTED NOT DETECTED Final   Pseudomonas aeruginosa NOT DETECTED NOT DETECTED Final   Candida albicans NOT DETECTED NOT DETECTED Final   Candida glabrata NOT DETECTED NOT DETECTED Final   Candida krusei NOT DETECTED NOT DETECTED Final   Candida parapsilosis NOT DETECTED NOT DETECTED Final   Candida tropicalis NOT DETECTED NOT DETECTED Final    Comment: Performed at [facility], [place] [address], [place], [place] [address]  Urine culture     Status: Abnormal   Collection Time: 07/06/19  4:37 AM   Specimen: Urine, Random  Result Value Ref Range Status   Specimen Description URINE, RANDOM  Final   Special Requests   Final    NONE Performed at [facility], [place] [address], [place],  [address]    Culture (A)  Final    >=100,000 COLONIES/mL MULTIPLE SPECIES PRESENT, SUGGEST RECOLLECTION   Report Status 07/07/2019 FINAL  Final  SARS Coronavirus 2 [facility] order, Performed in [facility] hospital lab) Nasopharyngeal Nasopharyngeal Swab     Status: None   Collection Time: 07/06/19  8:04 AM   Specimen: Nasopharyngeal Swab  Result Value Ref Range Status   SARS Coronavirus 2 NEGATIVE NEGATIVE Final    Comment: (NOTE) If result is NEGATIVE SARS-CoV-2 target nucleic acids are NOT DETECTED. The SARS-CoV-2 RNA is generally detectable  in upper and lower  respiratory specimens during the acute phase of infection. The lowest  concentration of SARS-CoV-2 viral copies this assay can detect is 250  copies / mL. A negative result does not preclude SARS-CoV-2 infection  and should not be used as the sole basis for treatment or other  patient management  decisions.  A negative result may occur with  improper specimen collection / handling, submission of specimen other  than nasopharyngeal swab, presence of viral mutation(s) within the  areas targeted by this assay, and inadequate number of viral copies  (<250 copies / mL). A negative result must be combined with clinical  observations, patient history, and epidemiological information. If result is POSITIVE SARS-CoV-2 target nucleic acids are DETECTED. The SARS-CoV-2 RNA is generally detectable in upper and lower  respiratory specimens dur ing the acute phase of infection.  Positive  results are indicative of active infection with SARS-CoV-2.  Clinical  correlation with patient history and other diagnostic information is  necessary to determine patient infection status.  Positive results do  not rule out bacterial infection or co-infection with other viruses. If result is PRESUMPTIVE POSTIVE SARS-CoV-2 nucleic acids MAY BE PRESENT.   A presumptive positive result was obtained on the submitted specimen  and confirmed on repeat testing.  While 2019 novel coronavirus  (SARS-CoV-2) nucleic acids may be present in the submitted sample  additional confirmatory testing may be necessary for epidemiological  and / or clinical management purposes  to differentiate between  SARS-CoV-2 and other Sarbecovirus currently known to infect humans.  If clinically indicated additional testing with an alternate test  methodology [phone]) is advised. The SARS-CoV-2 RNA is generally  detectable in upper and lower respiratory sp ecimens during the acute  phase of infection. The expected result is Negative. Fact Sheet for Patients:  StrictlyIdeas.no Fact Sheet for Healthcare Providers: BankingDealers.co.za This test is not yet approved or cleared by the [place] FDA and has been authorized for detection and/or diagnosis of SARS-CoV-2 by FDA under an  Emergency Use Authorization (EUA).  This EUA will remain in effect (meaning this test can be used) for the duration of the COVID-19 declaration under Section 564(b)(1) of the Act, 21 U.S.C. section 360bbb-3(b)(1), unless the authorization is terminated or revoked sooner. Performed at [facility], [place] [address], [place], [place] [address]   Culture, fungus without smear     Status: None (Preliminary result)   Collection Time: 07/06/19  6:48 PM   Specimen: Abscess; Other  Result Value Ref Range Status   Specimen Description ABSCESS PARASPINAL  Final   Special Requests NONE  Final   Culture   Final    NO FUNGUS ISOLATED AFTER 3 DAYS Performed at [facility], [address], [place], [place] [address]    Report Status PENDING  Incomplete  Aerobic/Anaerobic Culture (surgical/deep wound)     Status: None (Preliminary result)   Collection Time: 07/06/19  6:48 PM   Specimen: Abscess  Result Value Ref Range Status   Specimen Description ABSCESS  Final   Special Requests PARASPINAL  Final   Gram Stain   Final    FEW WBC PRESENT, PREDOMINANTLY PMN ABUNDANT GRAM POSITIVE COCCI Performed at [facility], [place] [address] [place], [place] [address]    Culture   Final    ABUNDANT STAPHYLOCOCCUS AUREUS NO ANAEROBES ISOLATED; CULTURE IN PROGRESS FOR 5 DAYS    Report Status PENDING  Incomplete   Organism ID, Bacteria STAPHYLOCOCCUS  AUREUS  Final      Susceptibility   Staphylococcus aureus - MIC*    CIPROFLOXACIN <=0.5 SENSITIVE Sensitive     ERYTHROMYCIN >=8 RESISTANT Resistant     GENTAMICIN <=0.5 SENSITIVE Sensitive     OXACILLIN <=0.25 SENSITIVE Sensitive     TETRACYCLINE >=16 RESISTANT Resistant     VANCOMYCIN 1 SENSITIVE Sensitive     TRIMETH/SULFA <=10 SENSITIVE Sensitive     CLINDAMYCIN RESISTANT Resistant     RIFAMPIN <=0.5 SENSITIVE Sensitive     Inducible Clindamycin POSITIVE Resistant     * ABUNDANT STAPHYLOCOCCUS AUREUS  Aerobic/Anaerobic Culture  (surgical/deep wound)     Status: None (Preliminary result)   Collection Time: 07/09/19  8:32 AM   Specimen: Abscess  Result Value Ref Range Status   Specimen Description ABSCESS HIP RIGHT  Final   Special Requests NONE  Final   Gram Stain   Final    MODERATE WBC PRESENT,BOTH PMN AND MONONUCLEAR ABUNDANT GRAM POSITIVE COCCI IN PAIRS IN CLUSTERS    Culture   Final    FEW STAPHYLOCOCCUS AUREUS SUSCEPTIBILITIES TO FOLLOW Performed at [facility], [place] [address] [place], [place] [address]    Report Status PENDING  Incomplete  Culture, blood (routine x 2)     Status: None (Preliminary result)   Collection Time: 07/09/19 12:57 PM   Specimen: BLOOD RIGHT HAND  Result Value Ref Range Status   Specimen Description BLOOD RIGHT HAND  Final   Special Requests   Final    AEROBIC BOTTLE ONLY Blood Culture results may not be optimal due to an inadequate volume of blood received in culture bottles   Culture   Final    NO GROWTH < 24 HOURS Performed at [facility], [place] [address] [place], [place] [address]    Report Status PENDING  Incomplete  Culture, blood (routine x 2)     Status: None (Preliminary result)   Collection Time: 07/09/19  1:06 PM   Specimen: BLOOD RIGHT HAND  Result Value Ref Range Status   Specimen Description BLOOD RIGHT HAND  Final   Special Requests AEROBIC BOTTLE ONLY Blood Culture adequate volume  Final   Culture   Final    NO GROWTH < 24 HOURS Performed at [facility], [place] [address] [place], [place] [address]    Report Status PENDING  Incomplete         Radiology Studies: No results found.      Scheduled Meds: . acetaminophen  650 mg Oral Q6H   Or  . acetaminophen  650 mg Rectal Q6H  . enoxaparin (LOVENOX) injection  0.5 mg/kg Subcutaneous Q24H  . gabapentin  300 mg Oral TID  . hydrALAZINE  25 mg Oral BID  . insulin aspart  0-15 Units Subcutaneous TID WC  . insulin aspart  0-5 Units Subcutaneous QHS  . insulin NPH Human  20  Units Subcutaneous BID AC & HS  . ketorolac  15 mg Intravenous Q6H  . sodium chloride flush  3 mL Intravenous Q12H   Continuous Infusions: . sodium chloride Stopped (07/10/19 1110)  .  ceFAZolin (ANCEF) IV Stopped (07/10/19 1140)     LOS: 4 days    Time spent: 35 mins.More than 50% of that time was spent in counseling and/or coordination of care.      Shelly Coss, MD [facility] Pager [phone]  If 7PM-7AM, please contact night-coverage [email or web address] Password [email or web address] 07/10/2019, 2:12 PM

## 2019-07-10 NOTE — Evaluation (Signed)
Physical Therapy Evaluation Patient Details Name: Bradley Cox MRN: [id number] DOB: 10/21/1971 Today's Date: 07/10/2019   History of Present Illness  Patient is a 48 y/o male who presents with right flank pain. Admitted with disseminated MSSA infection including bacteremia, Lumbosacral spinal epidural abscess, paraspinal/large gluteal/pelvic abscess with sciatica impingement. s/p I&D large gluteal abscess and neuroplasty of sciatica nerve 9/7. s/p CT aspiration right paraspinal abscess. PMH includes DM, HTN, morbid obesity, OSA.  Clinical Impression  Patient presents with pain, generalized weakness, impaired balance and impaired mobility s/p above. 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 Loves to go paint balling. Today, pt requires Max A of 2 for bed mobility and Mod A of 2 to stand from EOB using back of recliner for support. Increased time to perform all movement due to pain. Instructed pt in exercises to perform during the day. Pt has support of mother at d/c but not sure how much physical help she can provide. Would benefit from CIR to maximize independence and mobility prior to return home. Will follow acutely.    Follow Up Recommendations CIR;Supervision for mobility/OOB;Supervision/Assistance - 24 hour    Equipment Recommendations  Other (comment)(defer)    Recommendations for Other Services       Precautions / Restrictions Precautions Precautions: Fall Precaution Comments: hemovac Restrictions Weight Bearing Restrictions: Yes RLE Weight Bearing: Weight bearing as tolerated      Mobility  Bed Mobility Overal bed mobility: Needs Assistance Bed Mobility: Supine to Sit;Sit to Sidelying;Rolling Rolling: Max assist;+2 for physical assistance   Supine to sit: Max assist;+2 for physical assistance;HOB elevated   Sit to sidelying: Max assist;+2 for physical assistance;HOB elevated General bed mobility comments: Max A +2 to bring RLE towards EOB and then elevate trunk.   Assist to bring BLEs to return to supine.  Transfers Overall transfer level: Needs assistance Equipment used: (back of recliner) Transfers: Sit to/from Stand Sit to Stand: Mod assist;+2 physical assistance;From elevated surface         General transfer comment: Assist of 2 to power to standing with pt pulling up on back of chair. Wide BoS, flexed trunk. Increased pain.  Ambulation/Gait             General Gait Details: Unable  Stairs            Wheelchair Mobility    Modified Rankin (Stroke Patients Only)       Balance Overall balance assessment: Needs assistance Sitting-balance support: Feet supported;Bilateral upper extremity supported Sitting balance-Leahy Scale: Fair Sitting balance - Comments: Requires BUE support as position of comfort. Offloading right hip.   Standing balance support: During functional activity Standing balance-Leahy Scale: Poor Standing balance comment: Requires UE support on chair for support.                             Pertinent Vitals/Pain Pain Assessment: Faces Pain Score: 10-Worst pain ever Faces Pain Scale: Hurts worst Pain Location: Butt and RLE esp with movement Pain Descriptors / Indicators: Constant;Discomfort;Grimacing;Moaning Pain Intervention(s): Monitored during session;Repositioned;Limited activity within patient's tolerance;Utilized relaxation techniques    Home Living Family/patient expects to be discharged to:: Private residence Living Arrangements: Parent(mom can check on him) Available Help at Discharge: Family;Available PRN/intermittently Type of Home: House Home Access: Stairs to enter Entrance Stairs-Rails: Right Entrance Stairs-Number of Steps: 3-4 Home Layout: One level Home Equipment: None      Prior Function Level of Independence: Independent  Comments: Truck driver.     Hand Dominance   Dominant Hand: Right    Extremity/Trunk Assessment   Upper Extremity  Assessment Upper Extremity Assessment: Defer to OT evaluation    Lower Extremity Assessment Lower Extremity Assessment: RLE deficits/detail;Generalized weakness RLE Deficits / Details: Limited AROM throughout secondary to pain. Limited knee flexion. Able to perform quad set and ankle AROM WFL. Swelling throughout limb RLE: Unable to fully assess due to pain    Cervical / Trunk Assessment Cervical / Trunk Assessment: Other exceptions Cervical / Trunk Exceptions: s/p lumbar sx  Communication   Communication: No difficulties  Cognition Arousal/Alertness: Awake/alert Behavior During Therapy: WFL for tasks assessed/performed Overall Cognitive Status: Within Functional Limits for tasks assessed                                 General Comments: Focused on anticipation of pain      General Comments      Exercises     Assessment/Plan    PT Assessment Patient needs continued PT services  PT Problem List Decreased strength;Decreased mobility;Obesity;Decreased range of motion;Decreased activity tolerance;Cardiopulmonary status limiting activity;Decreased skin integrity;Pain;Decreased balance;Decreased knowledge of use of DME       PT Treatment Interventions Therapeutic activities;Gait training;Therapeutic exercise;Patient/family education;Balance training;Neuromuscular re-education;Functional mobility training;Wheelchair mobility training;DME instruction;Stair training    PT Goals (Current goals can be found in the Care Plan section)  Acute Rehab PT Goals Patient Stated Goal: to get back home PT Goal Formulation: With patient Time For Goal Achievement: 07/24/19 Potential to Achieve Goals: Fair    Frequency Min 3X/week   Barriers to discharge Decreased caregiver support      Co-evaluation PT/OT/SLP Co-Evaluation/Treatment: Yes Reason for Co-Treatment: To address functional/ADL transfers;For patient/therapist safety PT goals addressed during session:  Mobility/safety with mobility         AM-PAC PT "6 Clicks" Mobility  Outcome Measure Help needed turning from your back to your side while in a flat bed without using bedrails?: A Lot Help needed moving from lying on your back to sitting on the side of a flat bed without using bedrails?: Total Help needed moving to and from a bed to a chair (including a wheelchair)?: Total Help needed standing up from a chair using your arms (e.g., wheelchair or bedside chair)?: A Lot Help needed to walk in hospital room?: Total Help needed climbing 3-5 steps with a railing? : Total 6 Click Score: 8    End of Session   Activity Tolerance: Patient limited by pain Patient left: in bed;with call bell/phone within reach;with bed alarm set Nurse Communication: Mobility status PT Visit Diagnosis: Muscle weakness (generalized) (M62.81);Pain;Unsteadiness on feet (R26.81);Difficulty in walking, not elsewhere classified (R26.2) Pain - Right/Left: Right Pain - part of body: Leg(butt)    Time: [id number] PT Time Calculation (min) (ACUTE ONLY): 42 min   Charges:   PT Evaluation $PT Eval Moderate Complexity: 1 Mod PT Treatments $Therapeutic Activity: 8-22 mins        Wray Kearns, PT, DPT Acute Rehabilitation Services Pager [phone] Office [place] 07/10/2019, 1:04 PM

## 2019-07-10 NOTE — Progress Notes (Addendum)
[facility] for Infectious Disease  Date of Admission:  07/05/2019      Total days of antibiotics 6  Day 4 cefazolin          ASSESSMENT: Bradley Cox is a 48 y.o. 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 He was bacteremic with MSSA at presentation.  He is scheduled to receive TEE tomorrow with cardiology to assess for endocarditis. Repeated blood cultures drawn yesterday 9/07 are no growth so far. He is afebrile but still having some sweating episodes. Drainage in the right hip is still purulent - ?if he may need take back for another wash out out. Dr. Doreatha Martin is following.  Given his epidural abscess he will require 6-8 weeks IV therapy. Please hold off on placing PICC for now.   R Calf Erythema/Swelling = ?septic DVT vs abscess. Would image with ultrasound to evaluate.   Penicillin allergy - he is tolerating the cefazolin well. We discussed with him performing PCN allergy testing while inpatient and he would like to pursue this.   New Diabetes diagnosis - inpatient coordinator following him and assisting with recs  Localized Rash to R FA - ?contact dermatitis from tape. Watch for any spread; presently no concern for drug related.    PLAN: 1. Continue cefazolin  2. Hold on PICC for now 3. Follow drainage quantity/quality from R hip 4. Follow micro data  5. TEE 9/09 scheduled     Principal Problem:   MSSA bacteremia Active Problems:   Sepsis ([facility])   Abscess of right hip   Paraspinal abscess ([facility])   Obesity, Class III, BMI 40-49.9 (morbid obesity) ([place])   Essential hypertension   AKI (acute kidney injury) (H[facility])   Hyperkalemia   Compression of right sciatic nerve   . acetaminophen  650 mg Oral Q6H   Or  . acetaminophen  650 mg Rectal Q6H  . enoxaparin (LOVENOX) injection  0.5 mg/kg Subcutaneous Q24H  . gabapentin  300 mg Oral TID  . hydrALAZINE  25 mg Oral  BID  . insulin aspart  0-15 Units Subcutaneous TID WC  . insulin aspart  0-5 Units Subcutaneous QHS  . insulin NPH Human  20 Units Subcutaneous BID AC & HS  . ketorolac  15 mg Intravenous Q6H  . sodium chloride flush  3 mL Intravenous Q12H     SUBJECTIVE: Feeling better today but still "in and out" with pain and pain medication. Sweaty often. Attempted to stand with PT but could not bear weight on foot effectively. No changes with back pain currently.   He describes the right sided pain to have started around 2-3 days after receiving a steroid injection into the right hip--he actually received an injection bilaterally but noted the right to be particularly painful. He is an avid Mining engineer. Denies any alcohol, cigarette or drug use. No history of injection drugs.   History of penicillin allergy as an infant--"reports his respiratory system shut down." Has not re-trailed this class since. Doing well on cefazolin.   Temp Readings from Last 3 Encounters:  07/10/19 98.3 F (36.8 C) (Oral)  06/18/19 100.1 F (37.8 C) (Oral)   Lab Results  Component Value Date   WBC 8.6 07/10/2019   Lab Results  Component Value Date   CREATININE 0.69 07/10/2019   Lab Results  Component Value Date   CRP 43.5 (H) 07/06/2019  Review of Systems: Review of Systems  Constitutional: Negative for chills and fever.  Respiratory: Negative for cough and sputum production.   Cardiovascular: Positive for leg swelling (R>L). Negative for chest pain.  Gastrointestinal: Negative for abdominal pain and vomiting.  Genitourinary: Negative for dysuria.  Musculoskeletal: Positive for back pain and joint pain (R hip / leg).  Skin: Positive for rash (rash on right forearm).  Neurological: Negative for dizziness and headaches.    Allergies  Allergen Reactions  . Penicillins Anaphylaxis    Did it involve swelling of the face/tongue/throat, SOB, or low BP? Yes Did it involve sudden or severe rash/hives,  skin peeling, or any reaction on the inside of your mouth or nose? No Did you need to seek medical attention at a hospital or doctor's office? Yes When did it last happen?4 months old If all above answers are "NO", may proceed with cephalosporin use.   . Tramadol Anaphylaxis    OBJECTIVE: Vitals:   07/09/19 2332 07/10/19 0000 07/10/19 0738 07/10/19 1255  BP:  134/82 (!) 117/59 125/87  Pulse:  (!) 112 (!) 107 94  Resp:  13 (!) 22 20  Temp: 98.7 F (37.1 C)  97.7 F (36.5 C) 98.3 F (36.8 C)  TempSrc: Oral  Oral Oral  SpO2:  95% 92%   Weight:      Height:       Body mass index is 41.78 kg/m.  Physical Exam Constitutional:      Comments: Resting in bed. Falls asleep periodically.   Eyes:     General: No scleral icterus.    Pupils: Pupils are equal, round, and reactive to light.  Neck:     Musculoskeletal: Normal range of motion.  Cardiovascular:     Rate and Rhythm: Normal rate and regular rhythm.     Heart sounds: No murmur.  Pulmonary:     Effort: Pulmonary effort is normal. No respiratory distress.     Breath sounds: Normal breath sounds.  Musculoskeletal:     Comments: RLE swelling with pitting edema. Hemovac with ongoing purulent drainage noted. Insertion site clean/dry with tegaderm. 5/5 strength.   Skin:    General: Skin is warm and dry.     Capillary Refill: Capillary refill takes less than 2 seconds.     Comments: Maculopapular rash isolated on right inner upper forearm near [facility]. No other rash noted.   Neurological:     General: No focal deficit present.     Mental Status: He is oriented to person, place, and time.       Lab Results Lab Results  Component Value Date   WBC 8.6 07/10/2019   HGB 10.2 (L) 07/10/2019   HCT 31.0 (L) 07/10/2019   MCV 90.9 07/10/2019   PLT 216 07/10/2019    Lab Results  Component Value Date   CREATININE 0.69 07/10/2019   BUN 5 (L) 07/10/2019   NA 127 (L) 07/10/2019   K 3.9 07/10/2019   CL 88 (L) 07/10/2019   CO2 27  07/10/2019    Lab Results  Component Value Date   ALT 19 07/06/2019   AST 25 07/06/2019   ALKPHOS 84 07/06/2019   BILITOT 1.0 07/06/2019     Microbiology: Recent Results (from the past 240 hour(s))  Blood culture (routine x 2)     Status: Abnormal   Collection Time: 07/06/19  4:30 AM   Specimen: BLOOD  Result Value Ref Range Status   Specimen Description BLOOD SITE NOT SPECIFIED  Final  Special Requests   Final    BOTTLES DRAWN AEROBIC AND ANAEROBIC Blood Culture results may not be optimal due to an inadequate volume of blood received in culture bottles   Culture  Setup Time   Final    IN BOTH AEROBIC AND ANAEROBIC BOTTLES GRAM POSITIVE COCCI Organism ID to follow CRITICAL RESULT CALLED TO, READ BACK BY AND VERIFIED WITHDion Body [facility] 07/06/19 2058 JDW Performed at [facility], [address], [place], [place] [address]    Culture STAPHYLOCOCCUS AUREUS (A)  Final   Report Status 07/08/2019 FINAL  Final   Organism ID, Bacteria STAPHYLOCOCCUS AUREUS  Final      Susceptibility   Staphylococcus aureus - MIC*    CIPROFLOXACIN <=0.5 SENSITIVE Sensitive     ERYTHROMYCIN >=8 RESISTANT Resistant     GENTAMICIN <=0.5 SENSITIVE Sensitive     OXACILLIN <=0.25 SENSITIVE Sensitive     TETRACYCLINE >=16 RESISTANT Resistant     VANCOMYCIN 1 SENSITIVE Sensitive     TRIMETH/SULFA <=10 SENSITIVE Sensitive     CLINDAMYCIN RESISTANT Resistant     RIFAMPIN <=0.5 SENSITIVE Sensitive     Inducible Clindamycin POSITIVE Resistant     * STAPHYLOCOCCUS AUREUS  Blood culture (routine x 2)     Status: Abnormal   Collection Time: 07/06/19  4:30 AM   Specimen: BLOOD  Result Value Ref Range Status   Specimen Description BLOOD SITE NOT SPECIFIED  Final   Special Requests   Final    BOTTLES DRAWN AEROBIC AND ANAEROBIC Blood Culture adequate volume   Culture  Setup Time   Final    IN BOTH AEROBIC AND ANAEROBIC BOTTLES GRAM POSITIVE COCCI CRITICAL VALUE NOTED.  VALUE IS CONSISTENT WITH  PREVIOUSLY REPORTED AND CALLED VALUE.    Culture (A)  Final    STAPHYLOCOCCUS AUREUS SUSCEPTIBILITIES PERFORMED ON PREVIOUS CULTURE WITHIN THE LAST 5 DAYS. Performed at [facility], [place] [address] [place], [place] [address]    Report Status 07/08/2019 FINAL  Final  Blood Culture ID Panel (Reflexed)     Status: Abnormal   Collection Time: 07/06/19  4:30 AM  Result Value Ref Range Status   Enterococcus species NOT DETECTED NOT DETECTED Final   Listeria monocytogenes NOT DETECTED NOT DETECTED Final   Staphylococcus species DETECTED (A) NOT DETECTED Final    Comment: CRITICAL RESULT CALLED TO, READ BACK BY AND VERIFIED WITH: J MILLEN PHARMD 07/06/19 2058 JDW    Staphylococcus aureus (BCID) DETECTED (A) NOT DETECTED Final    Comment: Methicillin (oxacillin) susceptible Staphylococcus aureus (MSSA). Preferred therapy is anti staphylococcal beta lactam antibiotic (Cefazolin or Nafcillin), unless clinically contraindicated. CRITICAL RESULT CALLED TO, READ BACK BY AND VERIFIED WITH: J MILLEN PHARMD 07/06/19 2058 JDW    Methicillin resistance NOT DETECTED NOT DETECTED Final   Streptococcus species NOT DETECTED NOT DETECTED Final   Streptococcus agalactiae NOT DETECTED NOT DETECTED Final   Streptococcus pneumoniae NOT DETECTED NOT DETECTED Final   Streptococcus pyogenes NOT DETECTED NOT DETECTED Final   Acinetobacter baumannii NOT DETECTED NOT DETECTED Final   Enterobacteriaceae species NOT DETECTED NOT DETECTED Final   Enterobacter cloacae complex NOT DETECTED NOT DETECTED Final   Escherichia coli NOT DETECTED NOT DETECTED Final   Klebsiella oxytoca NOT DETECTED NOT DETECTED Final   Klebsiella pneumoniae NOT DETECTED NOT DETECTED Final   Proteus species NOT DETECTED NOT DETECTED Final   Serratia marcescens NOT DETECTED NOT DETECTED Final   Haemophilus influenzae NOT DETECTED NOT DETECTED Final  Neisseria meningitidis NOT DETECTED NOT DETECTED Final   Pseudomonas aeruginosa NOT  DETECTED NOT DETECTED Final   Candida albicans NOT DETECTED NOT DETECTED Final   Candida glabrata NOT DETECTED NOT DETECTED Final   Candida krusei NOT DETECTED NOT DETECTED Final   Candida parapsilosis NOT DETECTED NOT DETECTED Final   Candida tropicalis NOT DETECTED NOT DETECTED Final    Comment: Performed at [facility], [place] [address] [place], [place] [address]  Urine culture     Status: Abnormal   Collection Time: 07/06/19  4:37 AM   Specimen: Urine, Random  Result Value Ref Range Status   Specimen Description URINE, RANDOM  Final   Special Requests   Final    NONE Performed at [facility], [place] [address] [place], [place] [address]    Culture (A)  Final    >=100,000 COLONIES/mL MULTIPLE SPECIES PRESENT, SUGGEST RECOLLECTION   Report Status 07/07/2019 FINAL  Final  SARS Coronavirus 2 [facility] order, Performed in [facility] hospital lab) Nasopharyngeal Nasopharyngeal Swab     Status: None   Collection Time: 07/06/19  8:04 AM   Specimen: Nasopharyngeal Swab  Result Value Ref Range Status   SARS Coronavirus 2 NEGATIVE NEGATIVE Final    Comment: (NOTE) If result is NEGATIVE SARS-CoV-2 target nucleic acids are NOT DETECTED. The SARS-CoV-2 RNA is generally detectable in upper and lower  respiratory specimens during the acute phase of infection. The lowest  concentration of SARS-CoV-2 viral copies this assay can detect is 250  copies / mL. A negative result does not preclude SARS-CoV-2 infection  and should not be used as the sole basis for treatment or other  patient management decisions.  A negative result may occur with  improper specimen collection / handling, submission of specimen other  than nasopharyngeal swab, presence of viral mutation(s) within the  areas targeted by this assay, and inadequate number of viral copies  (<250 copies / mL). A negative result must be combined with clinical  observations, patient history, and epidemiological information. If  result is POSITIVE SARS-CoV-2 target nucleic acids are DETECTED. The SARS-CoV-2 RNA is generally detectable in upper and lower  respiratory specimens dur ing the acute phase of infection.  Positive  results are indicative of active infection with SARS-CoV-2.  Clinical  correlation with patient history and other diagnostic information is  necessary to determine patient infection status.  Positive results do  not rule out bacterial infection or co-infection with other viruses. If result is PRESUMPTIVE POSTIVE SARS-CoV-2 nucleic acids MAY BE PRESENT.   A presumptive positive result was obtained on the submitted specimen  and confirmed on repeat testing.  While 2019 novel coronavirus  (SARS-CoV-2) nucleic acids may be present in the submitted sample  additional confirmatory testing may be necessary for epidemiological  and / or clinical management purposes  to differentiate between  SARS-CoV-2 and other Sarbecovirus currently known to infect humans.  If clinically indicated additional testing with an alternate test  methodology [phone]) is advised. The SARS-CoV-2 RNA is generally  detectable in upper and lower respiratory sp ecimens during the acute  phase of infection. The expected result is Negative. Fact Sheet for Patients:  StrictlyIdeas.no Fact Sheet for Healthcare Providers: BankingDealers.co.za This test is not yet approved or cleared by the [place] FDA and has been authorized for detection and/or diagnosis of SARS-CoV-2 by FDA under an Emergency Use Authorization (EUA).  This EUA will remain in effect (meaning this test can be used) for the  duration of the COVID-19 declaration under Section 564(b)(1) of the Act, 21 U.S.C. section 360bbb-3(b)(1), unless the authorization is terminated or revoked sooner. Performed at [facility], [place] [address], [place], [place] [address]   Culture, fungus without smear     Status: None  (Preliminary result)   Collection Time: 07/06/19  6:48 PM   Specimen: Abscess; Other  Result Value Ref Range Status   Specimen Description ABSCESS PARASPINAL  Final   Special Requests NONE  Final   Culture   Final    NO FUNGUS ISOLATED AFTER 3 DAYS Performed at [facility], [address] [place], [place] [address]    Report Status PENDING  Incomplete  Aerobic/Anaerobic Culture (surgical/deep wound)     Status: None (Preliminary result)   Collection Time: 07/06/19  6:48 PM   Specimen: Abscess  Result Value Ref Range Status   Specimen Description ABSCESS  Final   Special Requests PARASPINAL  Final   Gram Stain   Final    FEW WBC PRESENT, PREDOMINANTLY PMN ABUNDANT GRAM POSITIVE COCCI Performed at [facility], [place] [address], [place], [place] [address]    Culture   Final    ABUNDANT STAPHYLOCOCCUS AUREUS NO ANAEROBES ISOLATED; CULTURE IN PROGRESS FOR 5 DAYS    Report Status PENDING  Incomplete   Organism ID, Bacteria STAPHYLOCOCCUS AUREUS  Final      Susceptibility   Staphylococcus aureus - MIC*    CIPROFLOXACIN <=0.5 SENSITIVE Sensitive     ERYTHROMYCIN >=8 RESISTANT Resistant     GENTAMICIN <=0.5 SENSITIVE Sensitive     OXACILLIN <=0.25 SENSITIVE Sensitive     TETRACYCLINE >=16 RESISTANT Resistant     VANCOMYCIN 1 SENSITIVE Sensitive     TRIMETH/SULFA <=10 SENSITIVE Sensitive     CLINDAMYCIN RESISTANT Resistant     RIFAMPIN <=0.5 SENSITIVE Sensitive     Inducible Clindamycin POSITIVE Resistant     * ABUNDANT STAPHYLOCOCCUS AUREUS  Aerobic/Anaerobic Culture (surgical/deep wound)     Status: None (Preliminary result)   Collection Time: 07/09/19  8:32 AM   Specimen: Abscess  Result Value Ref Range Status   Specimen Description ABSCESS HIP RIGHT  Final   Special Requests NONE  Final   Gram Stain   Final    MODERATE WBC PRESENT,BOTH PMN AND MONONUCLEAR ABUNDANT GRAM POSITIVE COCCI IN PAIRS IN CLUSTERS    Culture   Final    FEW STAPHYLOCOCCUS AUREUS  SUSCEPTIBILITIES TO FOLLOW Performed at [facility], [place] [address] [place], [place] [address]    Report Status PENDING  Incomplete  Culture, blood (routine x 2)     Status: None (Preliminary result)   Collection Time: 07/09/19 12:57 PM   Specimen: BLOOD RIGHT HAND  Result Value Ref Range Status   Specimen Description BLOOD RIGHT HAND  Final   Special Requests   Final    AEROBIC BOTTLE ONLY Blood Culture results may not be optimal due to an inadequate volume of blood received in culture bottles   Culture   Final    NO GROWTH < 24 HOURS Performed at [facility], [place] [address], [place],  [address]    Report Status PENDING  Incomplete  Culture, blood (routine x 2)     Status: None (Preliminary result)   Collection Time: 07/09/19  1:06 PM   Specimen: BLOOD RIGHT HAND  Result Value Ref Range Status   Specimen Description BLOOD RIGHT HAND  Final   Special Requests AEROBIC  BOTTLE ONLY Blood Culture adequate volume  Final   Culture   Final    NO GROWTH < 24 HOURS Performed at [facility], [place] [address] [place], [place] [address]    Report Status PENDING  Incomplete     Janene Madeira, MSN, NP-C [facility] for Infectious Disease [email or web address] Pager: [phone] Office: [phone] Cool: [phone]

## 2019-07-11 ENCOUNTER — Inpatient Hospital Stay (HOSPITAL_COMMUNITY): Payer: Self-pay | Admitting: Anesthesiology

## 2019-07-11 ENCOUNTER — Inpatient Hospital Stay (HOSPITAL_COMMUNITY): Payer: Self-pay

## 2019-07-11 ENCOUNTER — Encounter (HOSPITAL_COMMUNITY)
Admission: EM | Disposition: A | Discharge status: Rehabilitation Facility | Payer: Self-pay | Source: Home / Self Care | Attending: Family Medicine

## 2019-07-11 ENCOUNTER — Encounter (HOSPITAL_COMMUNITY): Payer: Self-pay | Admitting: *Deleted

## 2019-07-11 DIAGNOSIS — A4101 Sepsis due to Methicillin susceptible Staphylococcus aureus: Principal | ICD-10-CM

## 2019-07-11 DIAGNOSIS — M4620 Osteomyelitis of vertebra, site unspecified: Secondary | ICD-10-CM

## 2019-07-11 DIAGNOSIS — M62838 Other muscle spasm: Secondary | ICD-10-CM

## 2019-07-11 DIAGNOSIS — S344XXA Injury of lumbosacral plexus, initial encounter: Secondary | ICD-10-CM

## 2019-07-11 DIAGNOSIS — R7881 Bacteremia: Secondary | ICD-10-CM

## 2019-07-11 HISTORY — PX: TEE WITHOUT CARDIOVERSION: SHX5443

## 2019-07-11 LAB — BASIC METABOLIC PANEL
Anion gap: 9 (ref 5–15)
BUN: 5 mg/dL — ABNORMAL LOW (ref 6–20)
CO2: 32 mmol/L (ref 22–32)
Calcium: 8.3 mg/dL — ABNORMAL LOW (ref 8.9–10.3)
Chloride: 87 mmol/L — ABNORMAL LOW (ref 98–111)
Creatinine, Ser: 0.63 mg/dL (ref 0.61–1.24)
GFR calc Af Amer: >60 mL/min (ref >60)
GFR calc non Af Amer: >60 mL/min (ref >60)
Glucose, Bld: 247 mg/dL — ABNORMAL HIGH (ref 70–99)
Potassium: 3.5 mmol/L (ref 3.5–5.1)
Sodium: 128 mmol/L — ABNORMAL LOW (ref 135–145)

## 2019-07-11 LAB — GLUCOSE, CAPILLARY
Glucose-Capillary: 213 mg/dL — ABNORMAL HIGH (ref 70–99)
Glucose-Capillary: 202 mg/dL — ABNORMAL HIGH (ref 70–99)
Glucose-Capillary: 141 mg/dL — ABNORMAL HIGH (ref 70–99)
Glucose-Capillary: 146 mg/dL — ABNORMAL HIGH (ref 70–99)

## 2019-07-11 SURGERY — ECHOCARDIOGRAM, TRANSESOPHAGEAL
Anesthesia: Monitor Anesthesia Care

## 2019-07-11 MED ORDER — LIDOCAINE 2% (20 MG/ML) 5 ML SYRINGE
INTRAMUSCULAR | Status: DC | PRN
  Administered 2019-07-11: 100 mg via INTRAVENOUS

## 2019-07-11 MED ORDER — INSULIN ASPART 100 UNIT/ML ~~LOC~~ SOLN
8.0000 [IU] | Freq: Three times a day (TID) | SUBCUTANEOUS | Status: DC
  Administered 2019-07-11 – 2019-08-16 (×87): 8 [IU] via SUBCUTANEOUS

## 2019-07-11 MED ORDER — SODIUM CHLORIDE 0.9 % IV SOLN
INTRAVENOUS | Status: DC
  Administered 2019-07-11: 12:00:00 via INTRAVENOUS

## 2019-07-11 MED ORDER — PROPOFOL 10 MG/ML IV BOLUS
INTRAVENOUS | Status: DC | PRN
  Administered 2019-07-11: 50 mg via INTRAVENOUS
  Administered 2019-07-11: 20 mg via INTRAVENOUS
  Administered 2019-07-11: 50 mg via INTRAVENOUS
  Administered 2019-07-11 (×2): 20 mg via INTRAVENOUS
  Administered 2019-07-11: 40 mg via INTRAVENOUS

## 2019-07-11 NOTE — Anesthesia Preprocedure Evaluation (Signed)
Anesthesia Evaluation  Patient identified by MRN, date of birth, ID band Patient awake    Reviewed: Allergy & Precautions, H&P , NPO status , Patient's Chart, lab work & pertinent test results  Airway Mallampati: II  TM Distance: >3 FB Neck ROM: Full    Dental no notable dental hx. (+) Teeth Intact, Edentulous Upper   Pulmonary asthma , sleep apnea ,    Pulmonary exam normal breath sounds clear to auscultation       Cardiovascular hypertension, Pt. on medications Normal cardiovascular exam Rhythm:Regular Rate:Normal     Neuro/Psych  Neuromuscular disease negative psych ROS   GI/Hepatic Neg liver ROS, GERD  ,  Endo/Other  diabetes  Renal/GU      Musculoskeletal  (+) Arthritis ,   Abdominal   Peds  Hematology negative hematology ROS (+)   Anesthesia Other Findings   Reproductive/Obstetrics                             Anesthesia Physical Anesthesia Plan  ASA: III  Anesthesia Plan: MAC   Post-op Pain Management:    Induction: Intravenous  PONV Risk Score and Plan: Treatment may vary due to age or medical condition  Airway Management Planned: Natural Airway and Nasal Cannula  Additional Equipment:   Intra-op Plan:   Post-operative Plan:   Informed Consent: I have reviewed the patients History and Physical, chart, labs and discussed the procedure including the risks, benefits and alternatives for the proposed anesthesia with the patient or authorized representative who has indicated his/her understanding and acceptance.     Dental advisory given  Plan Discussed with: CRNA  Anesthesia Plan Comments:         Anesthesia Quick Evaluation

## 2019-07-11 NOTE — Progress Notes (Signed)
PT Cancellation Note  Patient Details Name: Bradley Cox MRN: [id number] DOB: 09-21-1971   Cancelled Treatment:    Reason Eval/Treat Not Completed: Patient at procedure or test/unavailable (TEE).  Ellamae Sia, PT, DPT Acute Rehabilitation Services Pager [phone] Office [phone]     Willy Eddy 07/11/2019, 12:32 PM

## 2019-07-11 NOTE — Progress Notes (Signed)
2034: Pt N/V, generalized tenderness of ABD, and no BM since PTA. Zofran administered and paged on-call provider for ABD x-ray. Will continue to monitor.   2310: Paged on-call provider that xray results are in. Received order for NG tube placement.   0015: NG tube placed. Xray verified placement and suction started. Will continue to monitor.   [id number]: Overnight, pt had NG tube output of 400 mL. Pt reports that he is feeling better.

## 2019-07-11 NOTE — Progress Notes (Signed)
PROGRESS NOTE  Bradley Cox [id number] DOB: [birth year]   PCP: Simona Huh, NP  Patient is from: Home  DOA: 07/05/2019 LOS: 5  Brief Narrative / Interim history: 48 year old male with history of obesity, asthma, hypertension, GERD, nephrolithiasis, diabetes mellitus who presented with low back pain radiating to the right gluteus/thigh ICU with progressive decline in the right lower extremity strength, difficulty ambulation over 2 to 3 weeks.  On presentation he was found to be febrile, tachycardic, tachypneic.  Blood glucose was elevated with increased anion gap.  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  Patient was started on broad-spectrum antibiotics, insulin for DKA.  Neurosurgery, ID consulted.  Patient underwent IR guided his paraspinal abscess drainage.  Blood cultures now growing MSSA.  Currently on cefazolin.  Subjective: No major events overnight of this morning.  Complains about pain and spasming in the right leg and constipation.  He says he has not a bowel movement in 4 days.  He also reports difficulty sleeping due to pain and blood draws.  He denies chest pain, dyspnea, nausea, vomiting, abdominal pain or UTI symptoms.  Mother at bedside.  Objective: Vitals:   07/11/19 1405 07/11/19 1415 07/11/19 1425 07/11/19 1435  BP: 122/64 127/72 119/71 130/67  Pulse: 88 89 90 88  Resp: (!) 25 (!) 25 (!) 24 (!) 25  Temp:      TempSrc:      SpO2: 98% 98% 98% 98%  Weight:      Height:        Intake/Output Summary (Last 24 hours) at 07/11/2019 1553 Last data filed at 07/11/2019 1552 Gross per 24 hour  Intake 1866.54 ml  Output 4225 ml  Net -2358.46 ml   Filed Weights   07/05/19 1807 07/06/19 2116  Weight: (!) 167.4 kg (!) 164 kg    Examination:  GENERAL: No acute distress.  Appears well.  HEENT: MMM.  Vision and hearing grossly intact.  NECK: Supple.  No apparent JVD.   RESP:  No IWOB. Good air movement bilaterally. CVS:  RRR. Heart sounds normal.  ABD/GI/GU: Bowel sounds present. Soft. Non tender.  MSK/EXT:  Moves extremities.  Erythema behind his right knee.  Swelling in right lower extremity.  Right hip drain in place. SKIN: no apparent skin lesion or wound NEURO: Awake, alert and oriented appropriately.  Diminished sensation in right lower extremity. PSYCH: Calm. Normal affect.   Assessment & Plan: Sepsis/MSSA bacteremia/L3-S1 epidural abscess/right iliopsoas and gluteal abscess/right lower extremity cellulitis -Lower extremity Doppler negative for DVT. -Currently on cefazolin.  ID following.   -Blood cultures showing MSSA.   -TTE did not show any vegetation.   -TEE on 9/9-linear 11 mm echodensity on the ventricular surface of the anterior leaflet is most likely a redundant chorda tendina and is unlikely to represent vegetation. -Appreciate Ortho trauma input-does not feel repeat I&D is warranted. -Continue pain management. -ID planning for 6 to 8 weeks of IV Ancef.  He is also on Flagyl.  We will clarify with ID -PT/OT evaluation -CIR  Uncontrolled NIDDM-2/DKA: DKA resolved.  Off insulin drip. Hemoglobin A1c of 12.6.  CBG elevated but improved.  -NPH 20 units twice daily -Add mealtime NovoLog at 8 units 3 times daily -Continue SSI-moderate -Diabetic coordinator  Hyponatremia with hypochloremia -Increase normal saline to 125 cc an hour -We will obtain urine chemistry  AKI: Resolved with IV hydration  Hypertension: Currently blood pressure stable. -Continue  hydralazine scheduled and as needed.  GERD: Continue PPI  Fatty liver:   Could be associated  with history of  diabetes, obesity  Morbid obesity: BMI 42.6   DVT prophylaxis: Subcu Lovenox Code Status: Full code Family Communication: Updated patient's mother at bedside. Disposition Plan: Remains inpatient.  Final disposition CIR Consultants: Ortho trauma, infectious  disease,  Procedures:  9/6-incision and drainage 9/9-TEE  Microbiology summarized: 9/4-SARS-CoV-2 screen negative 9/4-blood culture-MSSA 9/7-tissue culture MSSA  Antimicrobials: Anti-infectives (From admission, onward)   Start     Dose/Rate Route Frequency Ordered Stop   07/10/19 1600  metroNIDAZOLE (FLAGYL) IVPB 500 mg     500 mg 100 mL/hr over 60 Minutes Intravenous Every 8 hours 07/10/19 1549     07/09/19 0839  vancomycin (VANCOCIN) powder  Status:  Discontinued       As needed 07/09/19 0839 07/09/19 0935   07/09/19 0839  tobramycin (NEBCIN) powder  Status:  Discontinued       As needed 07/09/19 0840 07/09/19 0935   07/07/19 1830  ceFAZolin (ANCEF) IVPB 2g/100 mL premix     2 g 200 mL/hr over 30 Minutes Intravenous Every 8 hours 07/07/19 1333     07/07/19 0900  ceFAZolin (ANCEF) IVPB 2g/100 mL premix  Status:  Discontinued     2 g 200 mL/hr over 30 Minutes Intravenous Every 8 hours 07/07/19 0847 07/07/19 1333   07/06/19 2030  clindamycin (CLEOCIN) IVPB 600 mg  Status:  Discontinued     600 mg 100 mL/hr over 30 Minutes Intravenous Every 8 hours 07/06/19 2010 07/07/19 0847   07/06/19 1800  vancomycin (VANCOCIN) 1,500 mg in sodium chloride 0.9 % 500 mL IVPB  Status:  Discontinued     1,500 mg 250 mL/hr over 120 Minutes Intravenous Every 12 hours 07/06/19 0820 07/07/19 0847   07/06/19 1400  ceFEPIme (MAXIPIME) 2 g in sodium chloride 0.9 % 100 mL IVPB  Status:  Discontinued     2 g 200 mL/hr over 30 Minutes Intravenous Every 8 hours 07/06/19 0504 07/07/19 0847   07/06/19 1400  vancomycin (VANCOCIN) 2,000 mg in sodium chloride 0.9 % 500 mL IVPB  Status:  Discontinued     2,000 mg 250 mL/hr over 120 Minutes Intravenous Every 8 hours 07/06/19 0504 07/06/19 0818   07/06/19 0745  clindamycin (CLEOCIN) IVPB 600 mg  Status:  Discontinued     600 mg 100 mL/hr over 30 Minutes Intravenous  Once 07/06/19 0737 07/06/19 2029   07/06/19 0500  ceFEPIme (MAXIPIME) 2 g in sodium chloride 0.9 %  100 mL IVPB     2 g 200 mL/hr over 30 Minutes Intravenous  Once 07/06/19 0451 07/06/19 1335   07/06/19 0500  vancomycin (VANCOCIN) 2,500 mg in sodium chloride 0.9 % 500 mL IVPB     2,500 mg 250 mL/hr over 120 Minutes Intravenous  Once 07/06/19 0451 07/06/19 1335      Sch Meds:  Scheduled Meds: . acetaminophen  650 mg Oral Q6H   Or  . acetaminophen  650 mg Rectal Q6H  . enoxaparin (LOVENOX) injection  0.5 mg/kg Subcutaneous Q24H  . gabapentin  300 mg Oral TID  . hydrALAZINE  25 mg Oral BID  . insulin aspart  0-15 Units Subcutaneous TID WC  . insulin aspart  0-5 Units Subcutaneous QHS  . insulin aspart  8 Units Subcutaneous TID WC  . insulin NPH Human  20 Units Subcutaneous BID AC & HS  . sodium chloride flush  3 mL Intravenous Q12H  Continuous Infusions: . sodium chloride 125 mL/hr at 07/11/19 0843  .  ceFAZolin (ANCEF) IV 2 g (07/11/19 1100)  . metronidazole 500 mg (07/11/19 0840)   PRN Meds:.albuterol, fentaNYL (SUBLIMAZE) injection, hydrALAZINE, methocarbamol, ondansetron **OR** ondansetron (ZOFRAN) IV, oxyCODONE, zolpidem   I have personally reviewed the following labs and images: CBC: Recent Labs  Lab 07/06/19 0359 07/06/19 1738 07/07/19 0521 07/08/19 1019 07/09/19 0248 07/10/19 0521  WBC 10.4  --  6.7 6.3 5.9 8.6  NEUTROABS 8.6*  --   --   --   --   --   HGB 14.0 12.6* 11.1* 10.7* 10.4* 10.2*  HCT 42.6 37.0* 34.2* 31.6* 31.0* 31.0*  MCV 94.2  --  92.4 90.5 89.6 90.9  PLT 256  --  197 172 168 216   BMP &GFR Recent Labs  Lab 07/07/19 0521 07/08/19 1019 07/09/19 0248 07/10/19 0521 07/11/19 0345  NA 127* 123* 124* 127* 128*  K 4.1 4.2 3.8 3.9 3.5  CL 92* 86* 86* 88* 87*  CO2 24 27 28 27  32  GLUCOSE 130* 307* 276* 312* 247*  BUN 11 7 6  5* 5*  CREATININE 0.64 0.70 0.55* 0.69 0.63  CALCIUM 8.2* 8.0* 8.0* 7.9* 8.3*   Estimated Creatinine Clearance: 190.2 mL/min (by C-G formula based on SCr of 0.63 mg/dL). Liver & Pancreas: Recent Labs  Lab 07/06/19  0526  AST 25  ALT 19  ALKPHOS 84  BILITOT 1.0  PROT 7.7  ALBUMIN 1.9*   No results for input(s): LIPASE, AMYLASE in the last 168 hours. No results for input(s): AMMONIA in the last 168 hours. Diabetic: No results for input(s): HGBA1C in the last 72 hours. Recent Labs  Lab 07/10/19 1257 07/10/19 1630 07/10/19 2057 07/11/19 0835 07/11/19 1140  GLUCAP 287* 313* 325* 213* 202*   Cardiac Enzymes: No results for input(s): CKTOTAL, CKMB, CKMBINDEX, TROPONINI in the last 168 hours. No results for input(s): PROBNP in the last 8760 hours. Coagulation Profile: No results for input(s): INR, PROTIME in the last 168 hours. Thyroid Function Tests: No results for input(s): TSH, T4TOTAL, FREET4, T3FREE, THYROIDAB in the last 72 hours. Lipid Profile: No results for input(s): CHOL, HDL, LDLCALC, TRIG, CHOLHDL, LDLDIRECT in the last 72 hours. Anemia Panel: No results for input(s): VITAMINB12, FOLATE, FERRITIN, TIBC, IRON, RETICCTPCT in the last 72 hours. Urine analysis:    Component Value Date/Time   COLORURINE YELLOW 07/06/2019 [place] 07/06/2019 0437   LABSPEC 1.024 07/06/2019 0437   PHURINE 5.0 07/06/2019 0437   GLUCOSEU >=500 (A) 07/06/2019 0437   HGBUR MODERATE (A) 07/06/2019 0437   BILIRUBINUR NEGATIVE 07/06/2019 0437   KETONESUR 80 (A) 07/06/2019 0437   PROTEINUR NEGATIVE 07/06/2019 0437   NITRITE NEGATIVE 07/06/2019 0437   LEUKOCYTESUR NEGATIVE 07/06/2019 0437   Sepsis Labs: Invalid input(s): PROCALCITONIN, [place]  Microbiology: Recent Results (from the past 240 hour(s))  Blood culture (routine x 2)     Status: Abnormal   Collection Time: 07/06/19  4:30 AM   Specimen: BLOOD  Result Value Ref Range Status   Specimen Description BLOOD SITE NOT SPECIFIED  Final   Special Requests   Final    BOTTLES DRAWN AEROBIC AND ANAEROBIC Blood Culture results may not be optimal due to an inadequate volume of blood received in culture bottles   Culture  Setup  Time   Final    IN BOTH AEROBIC AND ANAEROBIC BOTTLES GRAM POSITIVE COCCI Organism ID to follow CRITICAL RESULT CALLED TO, READ BACK BY AND  VERIFIED WITHDion Body [facility] 07/06/19 2058 JDW Performed at [facility] [address] [place], [place] [address]    Culture STAPHYLOCOCCUS AUREUS (A)  Final   Report Status 07/08/2019 FINAL  Final   Organism ID, Bacteria STAPHYLOCOCCUS AUREUS  Final      Susceptibility   Staphylococcus aureus - MIC*    CIPROFLOXACIN <=0.5 SENSITIVE Sensitive     ERYTHROMYCIN >=8 RESISTANT Resistant     GENTAMICIN <=0.5 SENSITIVE Sensitive     OXACILLIN <=0.25 SENSITIVE Sensitive     TETRACYCLINE >=16 RESISTANT Resistant     VANCOMYCIN 1 SENSITIVE Sensitive     TRIMETH/SULFA <=10 SENSITIVE Sensitive     CLINDAMYCIN RESISTANT Resistant     RIFAMPIN <=0.5 SENSITIVE Sensitive     Inducible Clindamycin POSITIVE Resistant     * STAPHYLOCOCCUS AUREUS  Blood culture (routine x 2)     Status: Abnormal   Collection Time: 07/06/19  4:30 AM   Specimen: BLOOD  Result Value Ref Range Status   Specimen Description BLOOD SITE NOT SPECIFIED  Final   Special Requests   Final    BOTTLES DRAWN AEROBIC AND ANAEROBIC Blood Culture adequate volume   Culture  Setup Time   Final    IN BOTH AEROBIC AND ANAEROBIC BOTTLES GRAM POSITIVE COCCI CRITICAL VALUE NOTED.  VALUE IS CONSISTENT WITH PREVIOUSLY REPORTED AND CALLED VALUE.    Culture (A)  Final    STAPHYLOCOCCUS AUREUS SUSCEPTIBILITIES PERFORMED ON PREVIOUS CULTURE WITHIN THE LAST 5 DAYS. Performed at [facility], [place] [address], [place], [place] [address]    Report Status 07/08/2019 FINAL  Final  Blood Culture ID Panel (Reflexed)     Status: Abnormal   Collection Time: 07/06/19  4:30 AM  Result Value Ref Range Status   Enterococcus species NOT DETECTED NOT DETECTED Final   Listeria monocytogenes NOT DETECTED NOT DETECTED Final   Staphylococcus species DETECTED (A) NOT DETECTED Final    Comment: CRITICAL  RESULT CALLED TO, READ BACK BY AND VERIFIED WITH: J MILLEN PHARMD 07/06/19 2058 JDW    Staphylococcus aureus (BCID) DETECTED (A) NOT DETECTED Final    Comment: Methicillin (oxacillin) susceptible Staphylococcus aureus (MSSA). Preferred therapy is anti staphylococcal beta lactam antibiotic (Cefazolin or Nafcillin), unless clinically contraindicated. CRITICAL RESULT CALLED TO, READ BACK BY AND VERIFIED WITH: J MILLEN PHARMD 07/06/19 2058 JDW    Methicillin resistance NOT DETECTED NOT DETECTED Final   Streptococcus species NOT DETECTED NOT DETECTED Final   Streptococcus agalactiae NOT DETECTED NOT DETECTED Final   Streptococcus pneumoniae NOT DETECTED NOT DETECTED Final   Streptococcus pyogenes NOT DETECTED NOT DETECTED Final   Acinetobacter baumannii NOT DETECTED NOT DETECTED Final   Enterobacteriaceae species NOT DETECTED NOT DETECTED Final   Enterobacter cloacae complex NOT DETECTED NOT DETECTED Final   Escherichia coli NOT DETECTED NOT DETECTED Final   Klebsiella oxytoca NOT DETECTED NOT DETECTED Final   Klebsiella pneumoniae NOT DETECTED NOT DETECTED Final   Proteus species NOT DETECTED NOT DETECTED Final   Serratia marcescens NOT DETECTED NOT DETECTED Final   Haemophilus influenzae NOT DETECTED NOT DETECTED Final   Neisseria meningitidis NOT DETECTED NOT DETECTED Final   Pseudomonas aeruginosa NOT DETECTED NOT DETECTED Final   Candida albicans NOT DETECTED NOT DETECTED Final   Candida glabrata NOT DETECTED NOT DETECTED Final   Candida krusei NOT DETECTED NOT DETECTED Final   Candida parapsilosis NOT DETECTED NOT DETECTED Final   Candida tropicalis NOT DETECTED NOT DETECTED Final  Comment: Performed at [facility], [place] [address] [place], [place] [address]  Urine culture     Status: Abnormal   Collection Time: 07/06/19  4:37 AM   Specimen: Urine, Random  Result Value Ref Range Status   Specimen Description URINE, RANDOM  Final   Special Requests   Final    NONE Performed  at [facility], [place] [address] [place], [place] [address]    Culture (A)  Final    >=100,000 COLONIES/mL MULTIPLE SPECIES PRESENT, SUGGEST RECOLLECTION   Report Status 07/07/2019 FINAL  Final  SARS Coronavirus 2 [facility] order, Performed in [facility] hospital lab) Nasopharyngeal Nasopharyngeal Swab     Status: None   Collection Time: 07/06/19  8:04 AM   Specimen: Nasopharyngeal Swab  Result Value Ref Range Status   SARS Coronavirus 2 NEGATIVE NEGATIVE Final    Comment: (NOTE) If result is NEGATIVE SARS-CoV-2 target nucleic acids are NOT DETECTED. The SARS-CoV-2 RNA is generally detectable in upper and lower  respiratory specimens during the acute phase of infection. The lowest  concentration of SARS-CoV-2 viral copies this assay can detect is 250  copies / mL. A negative result does not preclude SARS-CoV-2 infection  and should not be used as the sole basis for treatment or other  patient management decisions.  A negative result may occur with  improper specimen collection / handling, submission of specimen other  than nasopharyngeal swab, presence of viral mutation(s) within the  areas targeted by this assay, and inadequate number of viral copies  (<250 copies / mL). A negative result must be combined with clinical  observations, patient history, and epidemiological information. If result is POSITIVE SARS-CoV-2 target nucleic acids are DETECTED. The SARS-CoV-2 RNA is generally detectable in upper and lower  respiratory specimens dur ing the acute phase of infection.  Positive  results are indicative of active infection with SARS-CoV-2.  Clinical  correlation with patient history and other diagnostic information is  necessary to determine patient infection status.  Positive results do  not rule out bacterial infection or co-infection with other viruses. If result is PRESUMPTIVE POSTIVE SARS-CoV-2 nucleic acids MAY BE PRESENT.   A presumptive positive result was obtained  on the submitted specimen  and confirmed on repeat testing.  While 2019 novel coronavirus  (SARS-CoV-2) nucleic acids may be present in the submitted sample  additional confirmatory testing may be necessary for epidemiological  and / or clinical management purposes  to differentiate between  SARS-CoV-2 and other Sarbecovirus currently known to infect humans.  If clinically indicated additional testing with an alternate test  methodology [phone] is advised. The SARS-CoV-2 RNA is generally  detectable in upper and lower respiratory sp ecimens during the acute  phase of infection. The expected result is Negative. Fact Sheet for Patients:  StrictlyIdeas.no Fact Sheet for Healthcare Providers: BankingDealers.co.za This test is not yet approved or cleared by the [place] FDA and has been authorized for detection and/or diagnosis of SARS-CoV-2 by FDA under an Emergency Use Authorization (EUA).  This EUA will remain in effect (meaning this test can be used) for the duration of the COVID-19 declaration under Section 564(b)(1) of the Act, 21 U.S.C. section 360bbb-3(b)(1), unless the authorization is terminated or revoked sooner. Performed at [facility], [place] [address] [place], [place] [address]   Culture, fungus without smear     Status: None (Preliminary result)   Collection Time: 07/06/19  6:48 PM   Specimen: Abscess; Other  Result Value Ref Range Status   Specimen Description ABSCESS PARASPINAL  Final   Special Requests NONE  Final   Culture   Final    NO FUNGUS ISOLATED AFTER 3 DAYS Performed at [facility], [address], [place], [place] [address]    Report Status PENDING  Incomplete  Aerobic/Anaerobic Culture (surgical/deep wound)     Status: None (Preliminary result)   Collection Time: 07/06/19  6:48 PM   Specimen: Abscess  Result Value Ref Range Status   Specimen Description ABSCESS  Final   Special Requests  PARASPINAL  Final   Gram Stain   Final    FEW WBC PRESENT, PREDOMINANTLY PMN ABUNDANT GRAM POSITIVE COCCI Performed at [facility], [place] [address], [place], [place] [address]    Culture   Final    ABUNDANT STAPHYLOCOCCUS AUREUS NO ANAEROBES ISOLATED; CULTURE IN PROGRESS FOR 5 DAYS    Report Status PENDING  Incomplete   Organism ID, Bacteria STAPHYLOCOCCUS AUREUS  Final      Susceptibility   Staphylococcus aureus - MIC*    CIPROFLOXACIN <=0.5 SENSITIVE Sensitive     ERYTHROMYCIN >=8 RESISTANT Resistant     GENTAMICIN <=0.5 SENSITIVE Sensitive     OXACILLIN <=0.25 SENSITIVE Sensitive     TETRACYCLINE >=16 RESISTANT Resistant     VANCOMYCIN 1 SENSITIVE Sensitive     TRIMETH/SULFA <=10 SENSITIVE Sensitive     CLINDAMYCIN RESISTANT Resistant     RIFAMPIN <=0.5 SENSITIVE Sensitive     Inducible Clindamycin POSITIVE Resistant     * ABUNDANT STAPHYLOCOCCUS AUREUS  Aerobic/Anaerobic Culture (surgical/deep wound)     Status: None (Preliminary result)   Collection Time: 07/09/19  8:32 AM   Specimen: Abscess  Result Value Ref Range Status   Specimen Description ABSCESS HIP RIGHT  Final   Special Requests NONE  Final   Gram Stain   Final    MODERATE WBC PRESENT,BOTH PMN AND MONONUCLEAR ABUNDANT GRAM POSITIVE COCCI IN PAIRS IN CLUSTERS Performed at [facility], [address], [place], [place] [address]    Culture   Final    FEW STAPHYLOCOCCUS AUREUS NO ANAEROBES ISOLATED; CULTURE IN PROGRESS FOR 5 DAYS    Report Status PENDING  Incomplete   Organism ID, Bacteria STAPHYLOCOCCUS AUREUS  Final      Susceptibility   Staphylococcus aureus - MIC*    CIPROFLOXACIN <=0.5 SENSITIVE Sensitive     ERYTHROMYCIN >=8 RESISTANT Resistant     GENTAMICIN <=0.5 SENSITIVE Sensitive     OXACILLIN <=0.25 SENSITIVE Sensitive     TETRACYCLINE >=16 RESISTANT Resistant     VANCOMYCIN 1 SENSITIVE Sensitive     TRIMETH/SULFA <=10 SENSITIVE Sensitive     CLINDAMYCIN RESISTANT Resistant      RIFAMPIN <=0.5 SENSITIVE Sensitive     Inducible Clindamycin POSITIVE Resistant     * FEW STAPHYLOCOCCUS AUREUS  Culture, blood (routine x 2)     Status: None (Preliminary result)   Collection Time: 07/09/19 12:57 PM   Specimen: BLOOD RIGHT HAND  Result Value Ref Range Status   Specimen Description BLOOD RIGHT HAND  Final   Special Requests   Final    AEROBIC BOTTLE ONLY Blood Culture results may not be optimal due to an inadequate volume of blood received in culture bottles   Culture   Final    NO GROWTH 2 DAYS Performed at [facility], [place] [address] [place], [place] [address]    Report Status PENDING  Incomplete  Culture, blood (routine x 2)     Status: None (Preliminary result)   Collection Time: 07/09/19  1:06 PM   Specimen: BLOOD RIGHT HAND  Result Value Ref Range Status   Specimen Description BLOOD RIGHT HAND  Final   Special Requests AEROBIC BOTTLE ONLY Blood Culture adequate volume  Final   Culture   Final    NO GROWTH 2 DAYS Performed at [facility], [address] [place], [place] [address]    Report Status PENDING  Incomplete    Radiology Studies: No results found.   35 minutes with more than 50% spent in reviewing records, counseling patient and coordinating care.    Svara Twyman T. [place]  If 7PM-7AM, please contact night-coverage [email or web address] Password [facility] 07/11/2019, 3:53 PM

## 2019-07-11 NOTE — Anesthesia Procedure Notes (Signed)
Procedure Name: MAC Date/Time: 07/11/2019 1:25 PM Performed by: Renato Shin, CRNA Pre-anesthesia Checklist: Patient identified, Emergency Drugs available, Suction available and Patient being monitored Patient Re-evaluated:Patient Re-evaluated prior to induction Oxygen Delivery Method: Nasal cannula Preoxygenation: Pre-oxygenation with 100% oxygen Induction Type: IV induction Placement Confirmation: positive ETCO2 and breath sounds checked- equal and bilateral Dental Injury: Teeth and Oropharynx as per pre-operative assessment

## 2019-07-11 NOTE — Progress Notes (Signed)
[name]ious Disease  Date of Admission:  07/05/2019      Total days of antibiotics 7  Day 5 cefazolin          ASSESSMENT: Bradley Cox is a 48 y.o. 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 He was bacteremic with MSSA at presentation.   MRSA Bacteremia = He is scheduled to receive TEE today at 2 p.m. to assess for endocarditis. Repeated blood cultures drawn yesterday 9/07 are no growth so far. Afebrile but still with purulent drainage in hemovac drain; 125cc out from surgery. Will discuss with Dr. Doreatha Martin; I am a little worried he may require further wash out given the purulence and what sounds to be worsening pain/spasms.   Given his epidural abscess he will require 6-8 weeks IV therapy. Please hold off on placing PICC for now.   R Calf Erythema/Swelling = no DVT or abscess noted on U/S. Still red and swollen but not tender.   Penicillin allergy - he is tolerating the cefazolin well. We discussed with him performing PCN allergy testing while inpatient and he would like to pursue this.   New Diabetes diagnosis - inpatient coordinator following him and assisting with recs  Localized Rash to R FA - improving, likely contact derm from tape. Would wait to clear up more prior to PCN skin testing.    PLAN: 1. Continue cefazolin  2. Hold on PICC for now 3. Follow drainage quantity/quality from R hip  4. Follow micro data  5. TEE 9/09 scheduled  6. PCN skin testing to follow by ID pharmacy team while inpatient - will discuss with Dr. Cyndia Skeeters when we are closer to this    Principal Problem:   MSSA bacteremia Active Problems:   Sepsis ([place])   Abscess of right hip   Paraspinal abscess ([facility])   Obesity, Class III, BMI 40-49.9 (morbid obesity) ([place])   Essential hypertension   AKI (acute kidney injury) ([place])   Hyperkalemia   Compression of right sciatic nerve   .  acetaminophen  650 mg Oral Q6H   Or  . acetaminophen  650 mg Rectal Q6H  . enoxaparin (LOVENOX) injection  0.5 mg/kg Subcutaneous Q24H  . gabapentin  300 mg Oral TID  . hydrALAZINE  25 mg Oral BID  . insulin aspart  0-15 Units Subcutaneous TID WC  . insulin aspart  0-5 Units Subcutaneous QHS  . insulin aspart  8 Units Subcutaneous TID WC  . insulin NPH Human  20 Units Subcutaneous BID AC & HS  . ketorolac  15 mg Intravenous Q6H  . sodium chloride flush  3 mL Intravenous Q12H     SUBJECTIVE: Upset about being NPO - very dry mouth. TEE scheduled for 14:10 today.  He is having increased spasms in the right leg which kept him from sleeping well. Back pain unchanged. Feels that he has more pain in the right groin than he has previously experienced.  No fevers overnight.   Temp Readings from Last 4 Encounters:  07/11/19 98.2 F (36.8 C) (Oral)  06/18/19 100.1 F (37.8 C) (Oral)   Lab Results  Component Value Date   WBC 8.6 07/10/2019   Lab Results  Component Value Date   CREATININE 0.63 07/11/2019   Lab Results  Component Value Date   CRP 43.5 (H) 07/06/2019    Review of Systems: Review of Systems  Constitutional: Negative for chills and fever.  Respiratory: Negative for cough and sputum production.   Cardiovascular: Positive for leg swelling (R>L). Negative for chest pain.  Gastrointestinal: Negative for abdominal pain and vomiting.  Genitourinary: Negative for dysuria.  Musculoskeletal: Positive for back pain and joint pain (R hip / leg).  Skin: Positive for rash (rash on right forearm).  Neurological: Negative for dizziness and headaches.    Allergies  Allergen Reactions  . Penicillins Anaphylaxis    Did it involve swelling of the face/tongue/throat, SOB, or low BP? Yes Did it involve sudden or severe rash/hives, skin peeling, or any reaction on the inside of your mouth or nose? No Did you need to seek medical attention at a hospital or doctor's office? Yes When  did it last happen?4 months old If all above answers are "NO", may proceed with cephalosporin use.   . Tramadol Anaphylaxis    OBJECTIVE: Vitals:   07/11/19 0006 07/11/19 0444 07/11/19 0800 07/11/19 0848  BP: (!) 145/83 119/62  126/78  Pulse: 87 93  88  Resp:    20  Temp: 97.9 F (36.6 C) 98.6 F (37 C) 98.2 F (36.8 C) 98.2 F (36.8 C)  TempSrc: Oral Oral Oral Oral  SpO2: 100% 96%  98%  Weight:      Height:       Body mass index is 41.78 kg/m.  Physical Exam Constitutional:      Comments: Resting in bed.   Eyes:     General: No scleral icterus.    Pupils: Pupils are equal, round, and reactive to light.  Neck:     Musculoskeletal: Normal range of motion.  Cardiovascular:     Rate and Rhythm: Normal rate and regular rhythm.     Heart sounds: No murmur.  Pulmonary:     Effort: Pulmonary effort is normal. No respiratory distress.     Breath sounds: Normal breath sounds.  Musculoskeletal:     Comments: RLE swelling with pitting edema. Hemovac with ongoing purulent drainage noted. Insertion site clean/dry with tegaderm. 5/5 strength.   Skin:    General: Skin is warm and dry.     Capillary Refill: Capillary refill takes less than 2 seconds.     Comments: Maculopapular rash isolated on right inner upper forearm near [facility]. No other rash noted.   Neurological:     General: No focal deficit present.     Mental Status: He is oriented to person, place, and time.     Lab Results Lab Results  Component Value Date   WBC 8.6 07/10/2019   HGB 10.2 (L) 07/10/2019   HCT 31.0 (L) 07/10/2019   MCV 90.9 07/10/2019   PLT 216 07/10/2019    Lab Results  Component Value Date   CREATININE 0.63 07/11/2019   BUN 5 (L) 07/11/2019   NA 128 (L) 07/11/2019   K 3.5 07/11/2019   CL 87 (L) 07/11/2019   CO2 32 07/11/2019    Lab Results  Component Value Date   ALT 19 07/06/2019   AST 25 07/06/2019   ALKPHOS 84 07/06/2019   BILITOT 1.0 07/06/2019     Microbiology: Recent Results  (from the past 240 hour(s))  Blood culture (routine x 2)     Status: Abnormal   Collection Time: 07/06/19  4:30 AM   Specimen: BLOOD  Result Value Ref Range Status   Specimen Description BLOOD SITE NOT SPECIFIED  Final   Special Requests   Final    BOTTLES DRAWN AEROBIC  AND ANAEROBIC Blood Culture results may not be optimal due to an inadequate volume of blood received in culture bottles   Culture  Setup Time   Final    IN BOTH AEROBIC AND ANAEROBIC BOTTLES GRAM POSITIVE COCCI Organism ID to follow CRITICAL RESULT CALLED TO, READ BACK BY AND VERIFIED WITHDion Body [facility] 07/06/19 2058 JDW Performed at [facility], [address], [place], [place] [address]    Culture STAPHYLOCOCCUS AUREUS (A)  Final   Report Status 07/08/2019 FINAL  Final   Organism ID, Bacteria STAPHYLOCOCCUS AUREUS  Final      Susceptibility   Staphylococcus aureus - MIC*    CIPROFLOXACIN <=0.5 SENSITIVE Sensitive     ERYTHROMYCIN >=8 RESISTANT Resistant     GENTAMICIN <=0.5 SENSITIVE Sensitive     OXACILLIN <=0.25 SENSITIVE Sensitive     TETRACYCLINE >=16 RESISTANT Resistant     VANCOMYCIN 1 SENSITIVE Sensitive     TRIMETH/SULFA <=10 SENSITIVE Sensitive     CLINDAMYCIN RESISTANT Resistant     RIFAMPIN <=0.5 SENSITIVE Sensitive     Inducible Clindamycin POSITIVE Resistant     * STAPHYLOCOCCUS AUREUS  Blood culture (routine x 2)     Status: Abnormal   Collection Time: 07/06/19  4:30 AM   Specimen: BLOOD  Result Value Ref Range Status   Specimen Description BLOOD SITE NOT SPECIFIED  Final   Special Requests   Final    BOTTLES DRAWN AEROBIC AND ANAEROBIC Blood Culture adequate volume   Culture  Setup Time   Final    IN BOTH AEROBIC AND ANAEROBIC BOTTLES GRAM POSITIVE COCCI CRITICAL VALUE NOTED.  VALUE IS CONSISTENT WITH PREVIOUSLY REPORTED AND CALLED VALUE.    Culture (A)  Final    STAPHYLOCOCCUS AUREUS SUSCEPTIBILITIES PERFORMED ON PREVIOUS CULTURE WITHIN THE LAST 5 DAYS. Performed at [facility], [place] [address] [place], [place] [address]    Report Status 07/08/2019 FINAL  Final  Blood Culture ID Panel (Reflexed)     Status: Abnormal   Collection Time: 07/06/19  4:30 AM  Result Value Ref Range Status   Enterococcus species NOT DETECTED NOT DETECTED Final   Listeria monocytogenes NOT DETECTED NOT DETECTED Final   Staphylococcus species DETECTED (A) NOT DETECTED Final    Comment: CRITICAL RESULT CALLED TO, READ BACK BY AND VERIFIED WITH: J MILLEN PHARMD 07/06/19 2058 JDW    Staphylococcus aureus (BCID) DETECTED (A) NOT DETECTED Final    Comment: Methicillin (oxacillin) susceptible Staphylococcus aureus (MSSA). Preferred therapy is anti staphylococcal beta lactam antibiotic (Cefazolin or Nafcillin), unless clinically contraindicated. CRITICAL RESULT CALLED TO, READ BACK BY AND VERIFIED WITH: J MILLEN PHARMD 07/06/19 2058 JDW    Methicillin resistance NOT DETECTED NOT DETECTED Final   Streptococcus species NOT DETECTED NOT DETECTED Final   Streptococcus agalactiae NOT DETECTED NOT DETECTED Final   Streptococcus pneumoniae NOT DETECTED NOT DETECTED Final   Streptococcus pyogenes NOT DETECTED NOT DETECTED Final   Acinetobacter baumannii NOT DETECTED NOT DETECTED Final   Enterobacteriaceae species NOT DETECTED NOT DETECTED Final   Enterobacter cloacae complex NOT DETECTED NOT DETECTED Final   Escherichia coli NOT DETECTED NOT DETECTED Final   Klebsiella oxytoca NOT DETECTED NOT DETECTED Final   Klebsiella pneumoniae NOT DETECTED NOT DETECTED Final   Proteus species NOT DETECTED NOT DETECTED Final   Serratia marcescens NOT DETECTED NOT DETECTED Final   Haemophilus influenzae NOT DETECTED NOT DETECTED Final   Neisseria meningitidis NOT DETECTED NOT DETECTED Final   Pseudomonas aeruginosa  NOT DETECTED NOT DETECTED Final   Candida albicans NOT DETECTED NOT DETECTED Final   Candida glabrata NOT DETECTED NOT DETECTED Final   Candida krusei NOT DETECTED NOT DETECTED Final   Candida  parapsilosis NOT DETECTED NOT DETECTED Final   Candida tropicalis NOT DETECTED NOT DETECTED Final    Comment: Performed at [facility], [place] [address], [place], [place] [address]  Urine culture     Status: Abnormal   Collection Time: 07/06/19  4:37 AM   Specimen: Urine, Random  Result Value Ref Range Status   Specimen Description URINE, RANDOM  Final   Special Requests   Final    NONE Performed at [facility], [place] [address] [place], [place] [address]    Culture (A)  Final    >=100,000 COLONIES/mL MULTIPLE SPECIES PRESENT, SUGGEST RECOLLECTION   Report Status 07/07/2019 FINAL  Final  SARS Coronavirus 2 [facility] order, Performed in [facility] hospital lab) Nasopharyngeal Nasopharyngeal Swab     Status: None   Collection Time: 07/06/19  8:04 AM   Specimen: Nasopharyngeal Swab  Result Value Ref Range Status   SARS Coronavirus 2 NEGATIVE NEGATIVE Final    Comment: (NOTE) If result is NEGATIVE SARS-CoV-2 target nucleic acids are NOT DETECTED. The SARS-CoV-2 RNA is generally detectable in upper and lower  respiratory specimens during the acute phase of infection. The lowest  concentration of SARS-CoV-2 viral copies this assay can detect is 250  copies / mL. A negative result does not preclude SARS-CoV-2 infection  and should not be used as the sole basis for treatment or other  patient management decisions.  A negative result may occur with  improper specimen collection / handling, submission of specimen other  than nasopharyngeal swab, presence of viral mutation(s) within the  areas targeted by this assay, and inadequate number of viral copies  (<250 copies / mL). A negative result must be combined with clinical  observations, patient history, and epidemiological information. If result is POSITIVE SARS-CoV-2 target nucleic acids are DETECTED. The SARS-CoV-2 RNA is generally detectable in upper and lower  respiratory specimens dur ing the acute phase of infection.   Positive  results are indicative of active infection with SARS-CoV-2.  Clinical  correlation with patient history and other diagnostic information is  necessary to determine patient infection status.  Positive results do  not rule out bacterial infection or co-infection with other viruses. If result is PRESUMPTIVE POSTIVE SARS-CoV-2 nucleic acids MAY BE PRESENT.   A presumptive positive result was obtained on the submitted specimen  and confirmed on repeat testing.  While 2019 novel coronavirus  (SARS-CoV-2) nucleic acids may be present in the submitted sample  additional confirmatory testing may be necessary for epidemiological  and / or clinical management purposes  to differentiate between  SARS-CoV-2 and other Sarbecovirus currently known to infect humans.  If clinically indicated additional testing with an alternate test  methodology [phone]) is advised. The SARS-CoV-2 RNA is generally  detectable in upper and lower respiratory sp ecimens during the acute  phase of infection. The expected result is Negative. Fact Sheet for Patients:  StrictlyIdeas.no Fact Sheet for Healthcare Providers: BankingDealers.co.za This test is not yet approved or cleared by the [place] FDA and has been authorized for detection and/or diagnosis of SARS-CoV-2 by FDA under an Emergency Use Authorization (EUA).  This EUA will remain in effect (meaning this test can be used) for the duration of the COVID-19 declaration under Section 564(b)(1) of the Act,  21 U.S.C. section 360bbb-3(b)(1), unless the authorization is terminated or revoked sooner. Performed at [facility], [place] [address] [place], [place] [address]   Culture, fungus without smear     Status: None (Preliminary result)   Collection Time: 07/06/19  6:48 PM   Specimen: Abscess; Other  Result Value Ref Range Status   Specimen Description ABSCESS PARASPINAL  Final   Special Requests  NONE  Final   Culture   Final    NO FUNGUS ISOLATED AFTER 3 DAYS Performed at [facility], [address] [place], [place] [address]    Report Status PENDING  Incomplete  Aerobic/Anaerobic Culture (surgical/deep wound)     Status: None (Preliminary result)   Collection Time: 07/06/19  6:48 PM   Specimen: Abscess  Result Value Ref Range Status   Specimen Description ABSCESS  Final   Special Requests PARASPINAL  Final   Gram Stain   Final    FEW WBC PRESENT, PREDOMINANTLY PMN ABUNDANT GRAM POSITIVE COCCI Performed at [facility], [place] [address], [place], [place] [address]    Culture   Final    ABUNDANT STAPHYLOCOCCUS AUREUS NO ANAEROBES ISOLATED; CULTURE IN PROGRESS FOR 5 DAYS    Report Status PENDING  Incomplete   Organism ID, Bacteria STAPHYLOCOCCUS AUREUS  Final      Susceptibility   Staphylococcus aureus - MIC*    CIPROFLOXACIN <=0.5 SENSITIVE Sensitive     ERYTHROMYCIN >=8 RESISTANT Resistant     GENTAMICIN <=0.5 SENSITIVE Sensitive     OXACILLIN <=0.25 SENSITIVE Sensitive     TETRACYCLINE >=16 RESISTANT Resistant     VANCOMYCIN 1 SENSITIVE Sensitive     TRIMETH/SULFA <=10 SENSITIVE Sensitive     CLINDAMYCIN RESISTANT Resistant     RIFAMPIN <=0.5 SENSITIVE Sensitive     Inducible Clindamycin POSITIVE Resistant     * ABUNDANT STAPHYLOCOCCUS AUREUS  Aerobic/Anaerobic Culture (surgical/deep wound)     Status: None (Preliminary result)   Collection Time: 07/09/19  8:32 AM   Specimen: Abscess  Result Value Ref Range Status   Specimen Description ABSCESS HIP RIGHT  Final   Special Requests NONE  Final   Gram Stain   Final    MODERATE WBC PRESENT,BOTH PMN AND MONONUCLEAR ABUNDANT GRAM POSITIVE COCCI IN PAIRS IN CLUSTERS Performed at [facility], [address] [place], [place] [address]    Culture   Final    FEW STAPHYLOCOCCUS AUREUS NO ANAEROBES ISOLATED; CULTURE IN PROGRESS FOR 5 DAYS    Report Status PENDING  Incomplete   Organism ID, Bacteria  STAPHYLOCOCCUS AUREUS  Final      Susceptibility   Staphylococcus aureus - MIC*    CIPROFLOXACIN <=0.5 SENSITIVE Sensitive     ERYTHROMYCIN >=8 RESISTANT Resistant     GENTAMICIN <=0.5 SENSITIVE Sensitive     OXACILLIN <=0.25 SENSITIVE Sensitive     TETRACYCLINE >=16 RESISTANT Resistant     VANCOMYCIN 1 SENSITIVE Sensitive     TRIMETH/SULFA <=10 SENSITIVE Sensitive     CLINDAMYCIN RESISTANT Resistant     RIFAMPIN <=0.5 SENSITIVE Sensitive     Inducible Clindamycin POSITIVE Resistant     * FEW STAPHYLOCOCCUS AUREUS  Culture, blood (routine x 2)     Status: None (Preliminary result)   Collection Time: 07/09/19 12:57 PM   Specimen: BLOOD RIGHT HAND  Result Value Ref Range Status   Specimen Description BLOOD RIGHT HAND  Final   Special Requests   Final    AEROBIC BOTTLE  ONLY Blood Culture results may not be optimal due to an inadequate volume of blood received in culture bottles   Culture   Final    NO GROWTH 2 DAYS Performed at [place] [address] [place], [place] [address]    Report Status PENDING  Incomplete  Culture, blood (routine x 2)     Status: None (Preliminary result)   Collection Time: 07/09/19  1:06 PM   Specimen: BLOOD RIGHT HAND  Result Value Ref Range Status   Specimen Description BLOOD RIGHT HAND  Final   Special Requests AEROBIC BOTTLE ONLY Blood Culture adequate volume  Final   Culture   Final    NO GROWTH 2 DAYS Performed at [facility], [place] [address] [place], [place] [address]    Report Status PENDING  Incomplete     Janene Madeira, MSN, NP-C [facility] [email or web address] Pager: [phone] Office: [phone] [place]: [phone]

## 2019-07-11 NOTE — Progress Notes (Signed)
  Echocardiogram Echocardiogram Transesophageal has been performed.  Roseanna Rainbow R 07/11/2019, 1:50 PM

## 2019-07-11 NOTE — Progress Notes (Signed)
Ortho Trauma Progress Note  Patient feels somewhat improved however he still is having pain in his hip.  He also has limited range of motion of his leg.  Complaining about being n.p.o. for his endoscopy.  Right lower extremity dressing is in place.  It is clean dry and intact.  The Hemovac drain has some serous drainage with some particulate in it.  Does not look comparable to the pus that was appreciated in the operating room.  He has active dorsiflexion plantarflexion of his ankle.  Limited movement of his hip and knee secondary to pain and discomfort.  Results for orders placed or performed during the hospital encounter of 07/05/19 (from the past 24 hour(s))  Glucose, capillary     Status: Abnormal   Collection Time: 07/10/19  4:30 PM  Result Value Ref Range   Glucose-Capillary 313 (H) 70 - 99 mg/dL  Glucose, capillary     Status: Abnormal   Collection Time: 07/10/19  8:57 PM  Result Value Ref Range   Glucose-Capillary 325 (H) 70 - 99 mg/dL  Basic metabolic panel     Status: Abnormal   Collection Time: 07/11/19  3:45 AM  Result Value Ref Range   Sodium 128 (L) 135 - 145 mmol/L   Potassium 3.5 3.5 - 5.1 mmol/L   Chloride 87 (L) 98 - 111 mmol/L   CO2 32 22 - 32 mmol/L   Glucose, Bld 247 (H) 70 - 99 mg/dL   BUN 5 (L) 6 - 20 mg/dL   Creatinine, Ser 0.63 0.61 - 1.24 mg/dL   Calcium 8.3 (L) 8.9 - 10.3 mg/dL   GFR calc non Af Amer >60 >60 mL/min   GFR calc Af Amer >60 >60 mL/min   Anion gap 9 5 - 15  Glucose, capillary     Status: Abnormal   Collection Time: 07/11/19  8:35 AM  Result Value Ref Range   Glucose-Capillary 213 (H) 70 - 99 mg/dL  Glucose, capillary     Status: Abnormal   Collection Time: 07/11/19 11:40 AM  Result Value Ref Range   Glucose-Capillary 202 (H) 70 - 99 mg/dL    Assessment and plan: 49 year old male with multifocal abscesses status post I&D right gluteal abscess with extension into the pelvis.  Patient had a significantly large abscess and will have  significant pain for a number of weeks.  The drain does not look overtly purulent compared to what it was intraoperatively.  I do not feel that a repeat I&D is warranted.  Continue with IV antibiotics.  We will continue evaluate.  Possible drain removal tomorrow versus the next day depending on output and appearance.  Shona Needles, MD [facility] [phone] (phone) [phone] (office) [email or web address]

## 2019-07-11 NOTE — Transfer of Care (Signed)
Immediate Anesthesia Transfer of Care Note  Patient: Bradley Cox  Procedure(s) Performed: TRANSESOPHAGEAL ECHOCARDIOGRAM (TEE) (N/A )  Patient Location: PACU and Endoscopy Unit  Anesthesia Type:MAC  Level of Consciousness: awake, alert  and patient cooperative  Airway & Oxygen Therapy: Patient Spontanous Breathing and Patient connected to nasal cannula oxygen  Post-op Assessment: Report given to RN and Post -op Vital signs reviewed and stable  Post vital signs: Reviewed and stable  Last Vitals:  Vitals Value Taken Time  BP 109/51 07/11/19 1345  Temp    Pulse 89 07/11/19 1346  Resp 15 07/11/19 1346  SpO2 98 % 07/11/19 1346  Vitals shown include unvalidated device data.  Last Pain:  Vitals:   07/11/19 1219  TempSrc: Temporal  PainSc: 8       Patients Stated Pain Goal: 3 (78/71/83 6725)  Complications: No apparent anesthesia complications

## 2019-07-11 NOTE — Anesthesia Postprocedure Evaluation (Signed)
Anesthesia Post Note  Patient: Bradley Cox  Procedure(s) Performed: TRANSESOPHAGEAL ECHOCARDIOGRAM (TEE) (N/A )     Patient location during evaluation: Endoscopy Anesthesia Type: MAC Level of consciousness: awake and alert Pain management: pain level controlled Vital Signs Assessment: post-procedure vital signs reviewed and stable Respiratory status: spontaneous breathing, nonlabored ventilation, respiratory function stable and patient connected to nasal cannula oxygen Cardiovascular status: blood pressure returned to baseline and stable Postop Assessment: no apparent nausea or vomiting Anesthetic complications: no    Last Vitals:  Vitals:   07/11/19 1219 07/11/19 1347  BP: 130/70 (!) 109/51  Pulse: 92 89  Resp: 16 15  Temp: (!) 36.3 C 37 C  SpO2: 97% 98%    Last Pain:  Vitals:   07/11/19 1347  TempSrc: Temporal  PainSc: 0-No pain                 Barnet Glasgow

## 2019-07-11 NOTE — Op Note (Signed)
INDICATIONS: bacteremia  PROCEDURE:   Informed consent was obtained prior to the procedure. The risks, benefits and alternatives for the procedure were discussed and the patient comprehended these risks.  Risks include, but are not limited to, cough, sore throat, vomiting, nausea, somnolence, esophageal and stomach trauma or perforation, bleeding, low blood pressure, aspiration, pneumonia, infection, trauma to the teeth and death.    After a procedural time-out, the oropharynx was anesthetized with 20% benzocaine spray.   During this procedure the patient was administered IV propofol, Anesthesiology, Dr. Valma Cava.  The transesophageal probe was inserted in the esophagus and stomach without difficulty and multiple views were obtained.  The patient was kept under observation until the patient left the procedure room.  The patient left the procedure room in stable condition.   Agitated microbubble saline contrast was not administered.  COMPLICATIONS:    There were no immediate complications.  FINDINGS:  No clear evidence for endocarditis. There is a small mobile echodensity on the ventricular surface of the aortic valve, but a redundant chorda tendina is favored over vegetation.  RECOMMENDATIONS:    Suspicion for endocarditis is low based on the current TEE findings, but a repeat TEE in 2 weeks is reasonable since the clinical scenario is strongly suggestive of endocarditis (especially if a confirmed diagnosis of endocarditis will change the duration of IV antibiotic therapy).  Time Spent Directly with the Patient:  30 minutes   Bradley Cox 07/11/2019, 1:41 PM

## 2019-07-11 NOTE — Interval H&P Note (Signed)
History and Physical Interval Note:  07/11/2019 12:32 PM  Bradley Cox  has presented today for surgery, with the diagnosis of bacteremia.  The various methods of treatment have been discussed with the patient and family. After consideration of risks, benefits and other options for treatment, the patient has consented to  Procedure(s): TRANSESOPHAGEAL ECHOCARDIOGRAM (TEE) (N/A) as a surgical intervention.  The patient's history has been reviewed, patient examined, no change in status, stable for surgery.  I have reviewed the patient's chart and labs.  Questions were answered to the patient's satisfaction.     Andre Swander

## 2019-07-12 ENCOUNTER — Encounter (HOSPITAL_COMMUNITY): Payer: Self-pay | Admitting: Cardiovascular Disease

## 2019-07-12 ENCOUNTER — Inpatient Hospital Stay (HOSPITAL_COMMUNITY): Payer: Self-pay

## 2019-07-12 DIAGNOSIS — E876 Hypokalemia: Secondary | ICD-10-CM

## 2019-07-12 DIAGNOSIS — R112 Nausea with vomiting, unspecified: Secondary | ICD-10-CM

## 2019-07-12 DIAGNOSIS — K567 Ileus, unspecified: Secondary | ICD-10-CM

## 2019-07-12 DIAGNOSIS — K5903 Drug induced constipation: Secondary | ICD-10-CM

## 2019-07-12 LAB — GLUCOSE, CAPILLARY
Glucose-Capillary: 247 mg/dL — ABNORMAL HIGH (ref 70–99)
Glucose-Capillary: 170 mg/dL — ABNORMAL HIGH (ref 70–99)
Glucose-Capillary: 174 mg/dL — ABNORMAL HIGH (ref 70–99)
Glucose-Capillary: 151 mg/dL — ABNORMAL HIGH (ref 70–99)
Glucose-Capillary: 137 mg/dL — ABNORMAL HIGH (ref 70–99)

## 2019-07-12 LAB — CBC
HCT: 31.1 % — ABNORMAL LOW (ref 39.0–52.0)
Hemoglobin: 10.1 g/dL — ABNORMAL LOW (ref 13.0–17.0)
MCH: 29.8 pg (ref 26.0–34.0)
MCHC: 32.5 g/dL (ref 30.0–36.0)
MCV: 91.7 fL (ref 80.0–100.0)
Platelets: 334 10*3/uL (ref 150–400)
RBC: 3.39 MIL/uL — ABNORMAL LOW (ref 4.22–5.81)
RDW: 12.7 % (ref 11.5–15.5)
WBC: 10 10*3/uL (ref 4.0–10.5)
nRBC: 0.4 % — ABNORMAL HIGH (ref 0.0–0.2)

## 2019-07-12 LAB — AEROBIC/ANAEROBIC CULTURE W GRAM STAIN (SURGICAL/DEEP WOUND)

## 2019-07-12 LAB — BASIC METABOLIC PANEL
Anion gap: 14 (ref 5–15)
BUN: <5 mg/dL — ABNORMAL LOW (ref 6–20)
CO2: 28 mmol/L (ref 22–32)
Calcium: 8.1 mg/dL — ABNORMAL LOW (ref 8.9–10.3)
Chloride: 89 mmol/L — ABNORMAL LOW (ref 98–111)
Creatinine, Ser: 0.59 mg/dL — ABNORMAL LOW (ref 0.61–1.24)
GFR calc Af Amer: >60 mL/min (ref >60)
GFR calc non Af Amer: >60 mL/min (ref >60)
Glucose, Bld: 187 mg/dL — ABNORMAL HIGH (ref 70–99)
Potassium: 3.4 mmol/L — ABNORMAL LOW (ref 3.5–5.1)
Sodium: 131 mmol/L — ABNORMAL LOW (ref 135–145)

## 2019-07-12 LAB — MAGNESIUM: Magnesium: 1.9 mg/dL (ref 1.7–2.4)

## 2019-07-12 MED ORDER — POLYETHYLENE GLYCOL 3350 17 G PO PACK
17.0000 g | PACK | Freq: Two times a day (BID) | ORAL | Status: DC
  Filled 2019-07-12: qty 1

## 2019-07-12 MED ORDER — POTASSIUM CHLORIDE CRYS ER 20 MEQ PO TBCR
40.0000 meq | EXTENDED_RELEASE_TABLET | ORAL | Status: AC
  Administered 2019-07-12 (×2): 40 meq via ORAL
  Filled 2019-07-12 (×3): qty 2

## 2019-07-12 MED ORDER — POLYETHYLENE GLYCOL 3350 17 G PO PACK: 17.0000 g | PACK | Freq: Two times a day (BID) | ORAL | Status: DC

## 2019-07-12 MED ORDER — POTASSIUM CHLORIDE 20 MEQ/15ML (10%) PO SOLN
40.0000 meq | ORAL | Status: DC
  Filled 2019-07-12: qty 30

## 2019-07-12 MED ORDER — POLYETHYLENE GLYCOL 3350 17 G PO PACK
17.0000 g | PACK | Freq: Two times a day (BID) | ORAL | Status: DC
  Administered 2019-07-12 – 2019-08-16 (×55): 17 g via ORAL
  Filled 2019-07-12 (×60): qty 1

## 2019-07-12 NOTE — Progress Notes (Addendum)
[facility]  Date of Admission:  07/05/2019      Total days of antibiotics 8  Day 6 cefazolin          ASSESSMENT: Bradley Cox is a 48 y.o. male with MSSA Bacteremia complicated by [id number] spinal epidural abscess with paraspinal abscesses and large right gluteal abscess now s/p I&D; hema vac drain remains. He was bacteremic with MSSA at presentation.   MSSA Bacteremia = TEE indicates echodensity that favors redundant chorde of AV; he has no significant valvular dysfunction or compromised EF otherwise. He is due for a long course of IV antibiotics that would sufficiently cover endocarditis on the low likelihood that this finding represented that; No need to repeat TEE from ID perspective to follow given that it will not alter duration of treatment. His blood cultures show clearance at 72 hours - OK to place PICC line.   L3-S1 Epidural Abscess = 8 week course IV therapy to target MSSA, which has been recovered from all cultures taken including blood and right hip. NSGY has seen and no indication for surgical intervention.   Gluteal Abscess = drain remains. One more day per ortho team's notes. He is being recommended CIR for therapy after he is medically ready from current hospitalization. Will keep the metronidazole going for now given size of abscess cavity and drain   R Calf Erythema/Swelling = no DVT or abscess noted on U/S. Still red and swollen but not tender.   Penicillin allergy - he is tolerating the cefazolin well. We discussed with him performing PCN allergy testing while inpatient and he would like to pursue this.   New Diabetes diagnosis - inpatient coordinator following him and assisting with recs  Localized Rash to R FA - improving, likely contact derm from tape. Would wait to clear up more prior to PCN skin testing.    PLAN: 1. Continue cefazolin --> November 2nd would be his 8 week end date 2. OK to place PICC line from ID  perspective  3. Follow drainage quantity/quality from R hip, appreciate ortho team  4. PCN skin testing to follow by ID pharmacy team while inpatient - will discuss with Dr. Cyndia Skeeters when we are closer to this    Principal Problem:   MSSA bacteremia Active Problems:   Sepsis ([place])   Abscess of right hip   Paraspinal abscess (H[facility])   Obesity, Class III, BMI 40-49.9 (morbid obesity) ([place])   Essential hypertension   AKI (acute kidney injury) ([place])   Hyperkalemia   Compression of right sciatic nerve    acetaminophen  650 mg Oral Q6H   Or   acetaminophen  650 mg Rectal Q6H   enoxaparin (LOVENOX) injection  0.5 mg/kg Subcutaneous Q24H   gabapentin  300 mg Oral TID   hydrALAZINE  25 mg Oral BID   insulin aspart  0-15 Units Subcutaneous TID WC   insulin aspart  0-5 Units Subcutaneous QHS   insulin aspart  8 Units Subcutaneous TID WC   insulin NPH Human  20 Units Subcutaneous BID AC & HS   polyethylene glycol  17 g Oral BID   sodium chloride flush  3 mL Intravenous Q12H     SUBJECTIVE: Asleep and resting comfortably.   Temp Readings from Last 4 Encounters:  07/12/19 98.6 F (37 C) (Oral)  06/18/19 100.1 F (37.8 C) (Oral)   Lab Results  Component Value Date   WBC 10.0 07/12/2019  Lab Results  Component Value Date   CREATININE 0.59 (L) 07/12/2019   Lab Results  Component Value Date   CRP 43.5 (H) 07/06/2019    Review of Systems: Review of Systems  Constitutional: Negative for chills and fever.  Respiratory: Negative for cough and sputum production.   Cardiovascular: Positive for leg swelling (R>L). Negative for chest pain.  Gastrointestinal: Negative for abdominal pain and vomiting.  Genitourinary: Negative for dysuria.  Musculoskeletal: Positive for back pain and joint pain (R hip / leg).  Skin: Positive for rash (rash on right forearm).  Neurological: Negative for dizziness and headaches.    Allergies  Allergen Reactions   Penicillins Anaphylaxis      Did it involve swelling of the face/tongue/throat, SOB, or low BP? Yes Did it involve sudden or severe rash/hives, skin peeling, or any reaction on the inside of your mouth or nose? No Did you need to seek medical attention at a hospital or doctor's office? Yes When did it last happen?4 months old If all above answers are NO, may proceed with cephalosporin use.    Tramadol Anaphylaxis    OBJECTIVE: Vitals:   07/12/19 0021 07/12/19 0420 07/12/19 1115 07/12/19 1200  BP: (!) 147/91 134/84 129/81 (!) 153/86  Pulse: (!) 108 (!) 103 99 97  Resp:   (!) 26   Temp: 98.8 F (37.1 C) 98.3 F (36.8 C) 98.3 F (36.8 C) 98.6 F (37 C)  TempSrc: Oral Oral Oral Oral  SpO2: 92% 96% (!) 89% 97%  Weight:      Height:       Body mass index is 41.78 kg/m.  Physical Exam Constitutional:      Comments: Resting in bed.   Eyes:     General: No scleral icterus.    Pupils: Pupils are equal, round, and reactive to light.  Neck:     Musculoskeletal: Normal range of motion.  Cardiovascular:     Rate and Rhythm: Normal rate and regular rhythm.     Heart sounds: No murmur.  Pulmonary:     Effort: Pulmonary effort is normal. No respiratory distress.     Breath sounds: Normal breath sounds.  Musculoskeletal:     Comments: RLE swelling with pitting edema. Hemovac with ongoing purulent drainage noted. Insertion site clean/dry with tegaderm. 5/5 strength.   Skin:    General: Skin is warm and dry.     Capillary Refill: Capillary refill takes less than 2 seconds.     Comments: Rash appears to be faded  Neurological:     General: No focal deficit present.     Mental Status: He is oriented to person, place, and time.     Lab Results Lab Results  Component Value Date   WBC 10.0 07/12/2019   HGB 10.1 (L) 07/12/2019   HCT 31.1 (L) 07/12/2019   MCV 91.7 07/12/2019   PLT 334 07/12/2019    Lab Results  Component Value Date   CREATININE 0.59 (L) 07/12/2019   BUN <5 (L) 07/12/2019   NA 131  (L) 07/12/2019   K 3.4 (L) 07/12/2019   CL 89 (L) 07/12/2019   CO2 28 07/12/2019    Lab Results  Component Value Date   ALT 19 07/06/2019   AST 25 07/06/2019   ALKPHOS 84 07/06/2019   BILITOT 1.0 07/06/2019     Microbiology: Recent Results (from the past 240 hour(s))  Blood culture (routine x 2)     Status: Abnormal   Collection Time: 07/06/19  4:30  AM   Specimen: BLOOD  Result Value Ref Range Status   Specimen Description BLOOD SITE NOT SPECIFIED  Final   Special Requests   Final    BOTTLES DRAWN AEROBIC AND ANAEROBIC Blood Culture results may not be optimal due to an inadequate volume of blood received in culture bottles   Culture  Setup Time   Final    IN BOTH AEROBIC AND ANAEROBIC BOTTLES GRAM POSITIVE COCCI Organism ID to follow CRITICAL RESULT CALLED TO, READ BACK BY AND VERIFIED [facility]ion Body [facility] 07/06/19 2058 JDW Performed at [facility], [address], [place], [place] [address]    Culture STAPHYLOCOCCUS AUREUS (A)  Final   Report Status 07/08/2019 FINAL  Final   Organism ID, Bacteria STAPHYLOCOCCUS AUREUS  Final      Susceptibility   Staphylococcus aureus - MIC*    CIPROFLOXACIN <=0.5 SENSITIVE Sensitive     ERYTHROMYCIN >=8 RESISTANT Resistant     GENTAMICIN <=0.5 SENSITIVE Sensitive     OXACILLIN <=0.25 SENSITIVE Sensitive     TETRACYCLINE >=16 RESISTANT Resistant     VANCOMYCIN 1 SENSITIVE Sensitive     TRIMETH/SULFA <=10 SENSITIVE Sensitive     CLINDAMYCIN RESISTANT Resistant     RIFAMPIN <=0.5 SENSITIVE Sensitive     Inducible Clindamycin POSITIVE Resistant     * STAPHYLOCOCCUS AUREUS  Blood culture (routine x 2)     Status: Abnormal   Collection Time: 07/06/19  4:30 AM   Specimen: BLOOD  Result Value Ref Range Status   Specimen Description BLOOD SITE NOT SPECIFIED  Final   Special Requests   Final    BOTTLES DRAWN AEROBIC AND ANAEROBIC Blood Culture adequate volume   Culture  Setup Time   Final    IN BOTH AEROBIC AND ANAEROBIC BOTTLES GRAM  POSITIVE COCCI CRITICAL VALUE NOTED.  VALUE IS CONSISTENT WITH PREVIOUSLY REPORTED AND CALLED VALUE.    Culture (A)  Final    STAPHYLOCOCCUS AUREUS SUSCEPTIBILITIES PERFORMED ON PREVIOUS CULTURE WITHIN THE LAST 5 DAYS. Performed at [facility], [place] [address] [place], [place] [address]    Report Status 07/08/2019 FINAL  Final  Blood Culture ID Panel (Reflexed)     Status: Abnormal   Collection Time: 07/06/19  4:30 AM  Result Value Ref Range Status   Enterococcus species NOT DETECTED NOT DETECTED Final   Listeria monocytogenes NOT DETECTED NOT DETECTED Final   Staphylococcus species DETECTED (A) NOT DETECTED Final    Comment: CRITICAL RESULT CALLED TO, READ BACK BY AND VERIFIED WITH: J MILLEN PHARMD 07/06/19 2058 JDW    Staphylococcus aureus (BCID) DETECTED (A) NOT DETECTED Final    Comment: Methicillin (oxacillin) susceptible Staphylococcus aureus (MSSA). Preferred therapy is anti staphylococcal beta lactam antibiotic (Cefazolin or Nafcillin), unless clinically contraindicated. CRITICAL RESULT CALLED TO, READ BACK BY AND VERIFIED WITH: Dion Body [facility] 07/06/19 2058 JDW    Methicillin resistance NOT DETECTED NOT DETECTED Final   Streptococcus species NOT DETECTED NOT DETECTED Final   Streptococcus agalactiae NOT DETECTED NOT DETECTED Final   Streptococcus pneumoniae NOT DETECTED NOT DETECTED Final   Streptococcus pyogenes NOT DETECTED NOT DETECTED Final   Acinetobacter baumannii NOT DETECTED NOT DETECTED Final   Enterobacteriaceae species NOT DETECTED NOT DETECTED Final   Enterobacter cloacae complex NOT DETECTED NOT DETECTED Final   Escherichia coli NOT DETECTED NOT DETECTED Final   Klebsiella oxytoca NOT DETECTED NOT DETECTED Final   Klebsiella pneumoniae NOT DETECTED NOT DETECTED Final   Proteus species NOT DETECTED  NOT DETECTED Final   Serratia marcescens NOT DETECTED NOT DETECTED Final   Haemophilus influenzae NOT DETECTED NOT DETECTED Final   Neisseria meningitidis NOT  DETECTED NOT DETECTED Final   Pseudomonas aeruginosa NOT DETECTED NOT DETECTED Final   Candida albicans NOT DETECTED NOT DETECTED Final   Candida glabrata NOT DETECTED NOT DETECTED Final   Candida krusei NOT DETECTED NOT DETECTED Final   Candida parapsilosis NOT DETECTED NOT DETECTED Final   Candida tropicalis NOT DETECTED NOT DETECTED Final    Comment: Performed at [facility], [place] [address], [place], [place] [address]  Urine culture     Status: Abnormal   Collection Time: 07/06/19  4:37 AM   Specimen: Urine, Random  Result Value Ref Range Status   Specimen Description URINE, RANDOM  Final   Special Requests   Final    NONE Performed at [facility], [place] [address] [place], [place] [address]    Culture (A)  Final    >=100,000 COLONIES/mL MULTIPLE SPECIES PRESENT, SUGGEST RECOLLECTION   Report Status 07/07/2019 FINAL  Final  SARS Coronavirus 2 [facility] order, Performed in [facility] hospital lab) Nasopharyngeal Nasopharyngeal Swab     Status: None   Collection Time: 07/06/19  8:04 AM   Specimen: Nasopharyngeal Swab  Result Value Ref Range Status   SARS Coronavirus 2 NEGATIVE NEGATIVE Final    Comment: (NOTE) If result is NEGATIVE SARS-CoV-2 target nucleic acids are NOT DETECTED. The SARS-CoV-2 RNA is generally detectable in upper and lower  respiratory specimens during the acute phase of infection. The lowest  concentration of SARS-CoV-2 viral copies this assay can detect is 250  copies / mL. A negative result does not preclude SARS-CoV-2 infection  and should not be used as the sole basis for treatment or other  patient management decisions.  A negative result may occur with  improper specimen collection / handling, submission of specimen other  than nasopharyngeal swab, presence of viral mutation(s) within the  areas targeted by this assay, and inadequate number of viral copies  (<250 copies / mL). A negative result must be combined with clinical    observations, patient history, and epidemiological information. If result is POSITIVE SARS-CoV-2 target nucleic acids are DETECTED. The SARS-CoV-2 RNA is generally detectable in upper and lower  respiratory specimens dur ing the acute phase of infection.  Positive  results are indicative of active infection with SARS-CoV-2.  Clinical  correlation with patient history and other diagnostic information is  necessary to determine patient infection status.  Positive results do  not rule out bacterial infection or co-infection with other viruses. If result is PRESUMPTIVE POSTIVE SARS-CoV-2 nucleic acids MAY BE PRESENT.   A presumptive positive result was obtained on the submitted specimen  and confirmed on repeat testing.  While 2019 novel coronavirus  (SARS-CoV-2) nucleic acids may be present in the submitted sample  additional confirmatory testing may be necessary for epidemiological  and / or clinical management purposes  to differentiate between  SARS-CoV-2 and other Sarbecovirus currently known to infect humans.  If clinically indicated additional testing with an alternate test  methodology [phone]) is advised. The SARS-CoV-2 RNA is generally  detectable in upper and lower respiratory sp ecimens during the acute  phase of infection. The expected result is Negative. Fact Sheet for Patients:  StrictlyIdeas.no Fact Sheet for Healthcare Providers: BankingDealers.co.za This test is not yet approved or cleared by the [place] FDA and has been authorized for detection and/or diagnosis of  SARS-CoV-2 by FDA under an Emergency Use Authorization (EUA).  This EUA will remain in effect (meaning this test can be used) for the duration of the COVID-19 declaration under Section 564(b)(1) of the Act, 21 U.S.C. section 360bbb-3(b)(1), unless the authorization is terminated or revoked sooner. Performed at [facility], [place] [address]  [place], [place] [address]   Culture, fungus without smear     Status: None (Preliminary result)   Collection Time: 07/06/19  6:48 PM   Specimen: Abscess; Other  Result Value Ref Range Status   Specimen Description ABSCESS PARASPINAL  Final   Special Requests NONE  Final   Culture   Final    NO FUNGUS ISOLATED AFTER 3 DAYS Performed at [facility], [address] [place], [place] [address]    Report Status PENDING  Incomplete  Aerobic/Anaerobic Culture (surgical/deep wound)     Status: None   Collection Time: 07/06/19  6:48 PM   Specimen: Abscess  Result Value Ref Range Status   Specimen Description ABSCESS  Final   Special Requests PARASPINAL  Final   Gram Stain   Final    FEW WBC PRESENT, PREDOMINANTLY PMN ABUNDANT GRAM POSITIVE COCCI    Culture   Final    ABUNDANT STAPHYLOCOCCUS AUREUS NO ANAEROBES ISOLATED Performed at [facility], [place] [address], [place], [place] [address]    Report Status 07/12/2019 FINAL  Final   Organism ID, Bacteria STAPHYLOCOCCUS AUREUS  Final      Susceptibility   Staphylococcus aureus - MIC*    CIPROFLOXACIN <=0.5 SENSITIVE Sensitive     ERYTHROMYCIN >=8 RESISTANT Resistant     GENTAMICIN <=0.5 SENSITIVE Sensitive     OXACILLIN <=0.25 SENSITIVE Sensitive     TETRACYCLINE >=16 RESISTANT Resistant     VANCOMYCIN 1 SENSITIVE Sensitive     TRIMETH/SULFA <=10 SENSITIVE Sensitive     CLINDAMYCIN RESISTANT Resistant     RIFAMPIN <=0.5 SENSITIVE Sensitive     Inducible Clindamycin POSITIVE Resistant     * ABUNDANT STAPHYLOCOCCUS AUREUS  Aerobic/Anaerobic Culture (surgical/deep wound)     Status: None (Preliminary result)   Collection Time: 07/09/19  8:32 AM   Specimen: Abscess  Result Value Ref Range Status   Specimen Description ABSCESS HIP RIGHT  Final   Special Requests NONE  Final   Gram Stain   Final    MODERATE WBC PRESENT,BOTH PMN AND MONONUCLEAR ABUNDANT GRAM POSITIVE COCCI IN PAIRS IN CLUSTERS Performed at [facility], [address], [place], [place] [address]    Culture   Final    FEW STAPHYLOCOCCUS AUREUS NO ANAEROBES ISOLATED; CULTURE IN PROGRESS FOR 5 DAYS    Report Status PENDING  Incomplete   Organism ID, Bacteria STAPHYLOCOCCUS AUREUS  Final      Susceptibility   Staphylococcus aureus - MIC*    CIPROFLOXACIN <=0.5 SENSITIVE Sensitive     ERYTHROMYCIN >=8 RESISTANT Resistant     GENTAMICIN <=0.5 SENSITIVE Sensitive     OXACILLIN <=0.25 SENSITIVE Sensitive     TETRACYCLINE >=16 RESISTANT Resistant     VANCOMYCIN 1 SENSITIVE Sensitive     TRIMETH/SULFA <=10 SENSITIVE Sensitive     CLINDAMYCIN RESISTANT Resistant     RIFAMPIN <=0.5 SENSITIVE Sensitive     Inducible Clindamycin POSITIVE Resistant     * FEW STAPHYLOCOCCUS AUREUS  Culture, blood (routine x 2)     Status: None (Preliminary result)   Collection Time: 07/09/19 12:57 PM   Specimen: BLOOD RIGHT  HAND  Result Value Ref Range Status   Specimen Description BLOOD RIGHT HAND  Final   Special Requests   Final    AEROBIC BOTTLE ONLY Blood Culture results may not be optimal due to an inadequate volume of blood received in culture bottles   Culture   Final    NO GROWTH 2 DAYS Performed at [place] [address], [place], [place] [address]    Report Status PENDING  Incomplete  Culture, blood (routine x 2)     Status: None (Preliminary result)   Collection Time: 07/09/19  1:06 PM   Specimen: BLOOD RIGHT HAND  Result Value Ref Range Status   Specimen Description BLOOD RIGHT HAND  Final   Special Requests AEROBIC BOTTLE ONLY Blood Culture adequate volume  Final   Culture   Final    NO GROWTH 2 DAYS Performed at [facility], [place] [address], [place], [place] [address]    Report Status PENDING  Incomplete     Janene Madeira, MSN, NP-C [facility] [email or web address] Pager: [phone] Office: [phone] [place]: [phone]

## 2019-07-12 NOTE — Progress Notes (Signed)
PROGRESS NOTE  Bradley Cox [id number] DOB: 1971-04-08   PCP: Simona Huh, NP  Patient is from: Home  DOA: 07/05/2019 LOS: 6  Brief Narrative / Interim history: 48 year old male with history of obesity, asthma, hypertension, GERD, nephrolithiasis, diabetes mellitus who presented with low back pain radiating to the right gluteus/thigh ICU with progressive decline in the right lower extremity strength, difficulty ambulation over 2 to 3 weeks.  On presentation he was found to be febrile, tachycardic, tachypneic.  Blood glucose was elevated with increased anion gap.  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  Patient was started on broad-spectrum antibiotics, insulin for DKA.  Neurosurgery, ID consulted.  Patient underwent IR guided his paraspinal abscess drainage.  Blood cultures now growing MSSA.  Currently on cefazolin.  Subjective: Patient had episode of nausea and vomiting early last night.  Vomiting is nonbilious.  KUB obtained and revealed marked gaseous dilation of stomach and nonobstructive gas pattern and small and large bowel concerning for ileus.  NG tube decompression was recommended.  NG tube placed.  However, NG tube came out this morning.  He has not had further emesis or abdominal pain.  He has not had a bowel movement yet.  Denies chest pain or dyspnea.  Endorses pain in his right leg, lower back and hip area after he tried to work with PT this morning.  Waiting on pain medication from RN.  Objective: Vitals:   07/12/19 0021 07/12/19 0420 07/12/19 1115 07/12/19 1200  BP: (!) 147/91 134/84 129/81 (!) 153/86  Pulse: (!) 108 (!) 103 99 97  Resp:   (!) 26   Temp: 98.8 F (37.1 C) 98.3 F (36.8 C) 98.3 F (36.8 C) 98.6 F (37 C)  TempSrc: Oral Oral Oral Oral  SpO2: 92% 96% (!) 89% 97%  Weight:      Height:        Intake/Output Summary (Last 24 hours) at 07/12/2019 1450  Last data filed at 07/12/2019 1226 Gross per 24 hour  Intake 300 ml  Output 4125 ml  Net -3825 ml   Filed Weights   07/05/19 1807 07/06/19 2116  Weight: (!) 167.4 kg (!) 164 kg    Examination:  GENERAL: No acute distress.  Appears well.  HEENT: MMM.  Vision and hearing grossly intact.  NECK: Supple.  No apparent JVD.  RESP:  No IWOB. Good air movement bilaterally. CVS:  RRR. Heart sounds normal.  ABD/GI/GU: Bowel sounds present. Soft. Non tender.  MSK/EXT:  Moves extremities.  Erythema in his right leg posteriorly.  Tender to palpation.  Notable swelling of RLE.  Right hip drain with purulent material. SKIN: Erythema posterior to his scalp proximally. NEURO: Awake, alert and oriented appropriately.  No gross deficit.  PSYCH: Calm. Normal affect.   Assessment & Plan: Sepsis/MSSA bacteremia/L3-S1 epidural abscess/right iliopsoas and gluteal abscess/right lower extremity cellulitis -Lower extremity Doppler negative for DVT. -Currently on cefazolin.  ID following.   -Blood cultures showing MSSA.   -TTE did not show any vegetation.   -TEE on 9/9-linear 11 mm echodensity on the ventricular surface of the anterior leaflet is most likely a redundant chorda tendina and is unlikely to represent vegetation. -Appreciate Ortho trauma input-does not feel repeat I&D is warranted. -Continue pain management with bowel regimen.. -ID planning for 6 to 8 weeks of IV Ancef.  He is also on Flagyl.  We will clarify with ID -PT/OT evaluation -CIR  Ileus/constipation -Status post NG tube.  NG tube came out this morning.  Patient does not want it back.  No further emesis. -Start clear liquid diet. -Scheduled MiraLAX twice daily.  May add Reglan if this does not work. -Mobilize as able.  Uncontrolled NIDDM-2/DKA: DKA resolved.  Off insulin drip. Hemoglobin A1c of 12.6.  CBG elevated but improved.  -Increase NPH to 23 units twice daily -Continue mealtime at 8 units. -Continue SSI-moderate  -Diabetic coordinator  Hyponatremia with hypochloremia: Improved. -Continue normal saline to 125 cc an hour -We will obtain urine chemistry  AKI: Resolved with IV hydration  Hypokalemia: Likely due to IV fluid. -Replenish and recheck -Magnesium normal.  Hypertension: Currently blood pressure stable. -Continue hydralazine scheduled and as needed.  GERD: Continue PPI  Fatty liver:   Could be associated  with history of  diabetes, obesity  Morbid obesity: BMI 42.6   DVT prophylaxis: Subcu Lovenox Code Status: Full code Family Communication: Updated patient's mother at bedside. Disposition Plan: Remains inpatient pending clinical improvement and clearance by consultants.  Final disposition CIR? Consultants: Ortho trauma, infectious disease  Procedures:  9/6-incision and drainage 9/9-TEE  Microbiology summarized: 9/4-SARS-CoV-2 screen negative 9/4-blood culture-MSSA 9/7-tissue culture MSSA  Antimicrobials: Anti-infectives (From admission, onward)   Start     Dose/Rate Route Frequency Ordered Stop   07/10/19 1600  metroNIDAZOLE (FLAGYL) IVPB 500 mg     500 mg 100 mL/hr over 60 Minutes Intravenous Every 8 hours 07/10/19 1549     07/09/19 0839  vancomycin (VANCOCIN) powder  Status:  Discontinued       As needed 07/09/19 0839 07/09/19 0935   07/09/19 0839  tobramycin (NEBCIN) powder  Status:  Discontinued       As needed 07/09/19 0840 07/09/19 0935   07/07/19 1830  ceFAZolin (ANCEF) IVPB 2g/100 mL premix     2 g 200 mL/hr over 30 Minutes Intravenous Every 8 hours 07/07/19 1333     07/07/19 0900  ceFAZolin (ANCEF) IVPB 2g/100 mL premix  Status:  Discontinued     2 g 200 mL/hr over 30 Minutes Intravenous Every 8 hours 07/07/19 0847 07/07/19 1333   07/06/19 2030  clindamycin (CLEOCIN) IVPB 600 mg  Status:  Discontinued     600 mg 100 mL/hr over 30 Minutes Intravenous Every 8 hours 07/06/19 2010 07/07/19 0847   07/06/19 1800  vancomycin (VANCOCIN) 1,500 mg in sodium  chloride 0.9 % 500 mL IVPB  Status:  Discontinued     1,500 mg 250 mL/hr over 120 Minutes Intravenous Every 12 hours 07/06/19 0820 07/07/19 0847   07/06/19 1400  ceFEPIme (MAXIPIME) 2 g in sodium chloride 0.9 % 100 mL IVPB  Status:  Discontinued     2 g 200 mL/hr over 30 Minutes Intravenous Every 8 hours 07/06/19 0504 07/07/19 0847   07/06/19 1400  vancomycin (VANCOCIN) 2,000 mg in sodium chloride 0.9 % 500 mL IVPB  Status:  Discontinued     2,000 mg 250 mL/hr over 120 Minutes Intravenous Every 8 hours 07/06/19 0504 07/06/19 0818   07/06/19 0745  clindamycin (CLEOCIN) IVPB 600 mg  Status:  Discontinued     600 mg 100 mL/hr over 30 Minutes Intravenous  Once 07/06/19 0737 07/06/19 2029   07/06/19 0500  ceFEPIme (MAXIPIME) 2 g in sodium chloride 0.9 % 100 mL IVPB     2 g 200 mL/hr over 30 Minutes Intravenous  Once 07/06/19 0451 07/06/19 1335   07/06/19 0500  vancomycin (VANCOCIN) 2,500 mg in sodium chloride 0.9 %  500 mL IVPB     2,500 mg 250 mL/hr over 120 Minutes Intravenous  Once 07/06/19 0451 07/06/19 1335      Sch Meds:  Scheduled Meds: . acetaminophen  650 mg Oral Q6H   Or  . acetaminophen  650 mg Rectal Q6H  . enoxaparin (LOVENOX) injection  0.5 mg/kg Subcutaneous Q24H  . gabapentin  300 mg Oral TID  . hydrALAZINE  25 mg Oral BID  . insulin aspart  0-15 Units Subcutaneous TID WC  . insulin aspart  0-5 Units Subcutaneous QHS  . insulin aspart  8 Units Subcutaneous TID WC  . insulin NPH Human  20 Units Subcutaneous BID AC & HS  . polyethylene glycol  17 g Oral BID  . sodium chloride flush  3 mL Intravenous Q12H   Continuous Infusions: . sodium chloride 125 mL/hr at 07/11/19 0843  .  ceFAZolin (ANCEF) IV 2 g (07/12/19 0956)  . metronidazole 500 mg (07/12/19 0813)   PRN Meds:.albuterol, fentaNYL (SUBLIMAZE) injection, hydrALAZINE, methocarbamol, ondansetron **OR** ondansetron (ZOFRAN) IV, oxyCODONE, zolpidem   I have personally reviewed the following labs and images: CBC:  Recent Labs  Lab 07/06/19 0359  07/07/19 0521 07/08/19 1019 07/09/19 0248 07/10/19 0521 07/12/19 0314  WBC 10.4  --  6.7 6.3 5.9 8.6 10.0  NEUTROABS 8.6*  --   --   --   --   --   --   HGB 14.0   < > 11.1* 10.7* 10.4* 10.2* 10.1*  HCT 42.6   < > 34.2* 31.6* 31.0* 31.0* 31.1*  MCV 94.2  --  92.4 90.5 89.6 90.9 91.7  PLT 256  --  197 172 168 216 334   < > = values in this interval not displayed.   BMP &GFR Recent Labs  Lab 07/08/19 1019 07/09/19 0248 07/10/19 0521 07/11/19 0345 07/12/19 0314  NA 123* 124* 127* 128* 131*  K 4.2 3.8 3.9 3.5 3.4*  CL 86* 86* 88* 87* 89*  CO2 27 28 27  32 28  GLUCOSE 307* 276* 312* 247* 187*  BUN 7 6 5* 5* <5*  CREATININE 0.70 0.55* 0.69 0.63 0.59*  CALCIUM 8.0* 8.0* 7.9* 8.3* 8.1*  MG  --   --   --   --  1.9   Estimated Creatinine Clearance: 188.1 mL/min (A) (by C-G formula based on SCr of 0.59 mg/dL (L)). Liver & Pancreas: Recent Labs  Lab 07/06/19 0526  AST 25  ALT 19  ALKPHOS 84  BILITOT 1.0  PROT 7.7  ALBUMIN 1.9*   No results for input(s): LIPASE, AMYLASE in the last 168 hours. No results for input(s): AMMONIA in the last 168 hours. Diabetic: No results for input(s): HGBA1C in the last 72 hours. Recent Labs  Lab 07/11/19 1140 07/11/19 1613 07/11/19 2156 07/12/19 0811 07/12/19 1223  GLUCAP 202* 141* 146* 170* 174*   Cardiac Enzymes: No results for input(s): CKTOTAL, CKMB, CKMBINDEX, TROPONINI in the last 168 hours. No results for input(s): PROBNP in the last 8760 hours. Coagulation Profile: No results for input(s): INR, PROTIME in the last 168 hours. Thyroid Function Tests: No results for input(s): TSH, T4TOTAL, FREET4, T3FREE, THYROIDAB in the last 72 hours. Lipid Profile: No results for input(s): CHOL, HDL, LDLCALC, TRIG, CHOLHDL, LDLDIRECT in the last 72 hours. Anemia Panel: No results for input(s): VITAMINB12, FOLATE, FERRITIN, TIBC, IRON, RETICCTPCT in the last 72 hours. Urine analysis:    Component Value  Date/Time   COLORURINE YELLOW 07/06/2019 [place] 07/06/2019  [place] 1.024 07/06/2019 [place] 5.0 07/06/2019 0437   GLUCOSEU >=500 (A) 07/06/2019 0437   HGBUR MODERATE (A) 07/06/2019 0437   BILIRUBINUR NEGATIVE 07/06/2019 0437   KETONESUR 80 (A) 07/06/2019 [place] 07/06/2019 0437   NITRITE NEGATIVE 07/06/2019 0437   LEUKOCYTESUR NEGATIVE 07/06/2019 0437   Sepsis Labs: Invalid input(s): PROCALCITONIN, [place]  Microbiology: Recent Results (from the past 240 hour(s))  Blood culture (routine x 2)     Status: Abnormal   Collection Time: 07/06/19  4:30 AM   Specimen: BLOOD  Result Value Ref Range Status   Specimen Description BLOOD SITE NOT SPECIFIED  Final   Special Requests   Final    BOTTLES DRAWN AEROBIC AND ANAEROBIC Blood Culture results may not be optimal due to an inadequate volume of blood received in culture bottles   Culture  Setup Time   Final    IN BOTH AEROBIC AND ANAEROBIC BOTTLES GRAM POSITIVE COCCI Organism ID to follow CRITICAL RESULT CALLED TO, READ BACK BY AND VERIFIED WITHDion Body [facility] 07/06/19 2058 JDW Performed at [facility], [address], [place], [place] [address]    Culture STAPHYLOCOCCUS AUREUS (A)  Final   Report Status 07/08/2019 FINAL  Final   Organism ID, Bacteria STAPHYLOCOCCUS AUREUS  Final      Susceptibility   Staphylococcus aureus - MIC*    CIPROFLOXACIN <=0.5 SENSITIVE Sensitive     ERYTHROMYCIN >=8 RESISTANT Resistant     GENTAMICIN <=0.5 SENSITIVE Sensitive     OXACILLIN <=0.25 SENSITIVE Sensitive     TETRACYCLINE >=16 RESISTANT Resistant     VANCOMYCIN 1 SENSITIVE Sensitive     TRIMETH/SULFA <=10 SENSITIVE Sensitive     CLINDAMYCIN RESISTANT Resistant     RIFAMPIN <=0.5 SENSITIVE Sensitive     Inducible Clindamycin POSITIVE Resistant     * STAPHYLOCOCCUS AUREUS  Blood culture (routine x 2)     Status: Abnormal   Collection Time: 07/06/19  4:30 AM   Specimen: BLOOD   Result Value Ref Range Status   Specimen Description BLOOD SITE NOT SPECIFIED  Final   Special Requests   Final    BOTTLES DRAWN AEROBIC AND ANAEROBIC Blood Culture adequate volume   Culture  Setup Time   Final    IN BOTH AEROBIC AND ANAEROBIC BOTTLES GRAM POSITIVE COCCI CRITICAL VALUE NOTED.  VALUE IS CONSISTENT WITH PREVIOUSLY REPORTED AND CALLED VALUE.    Culture (A)  Final    STAPHYLOCOCCUS AUREUS SUSCEPTIBILITIES PERFORMED ON PREVIOUS CULTURE WITHIN THE LAST 5 DAYS. Performed at [facility], [place] [address] [place], [place] [address]    Report Status 07/08/2019 FINAL  Final  Blood Culture ID Panel (Reflexed)     Status: Abnormal   Collection Time: 07/06/19  4:30 AM  Result Value Ref Range Status   Enterococcus species NOT DETECTED NOT DETECTED Final   Listeria monocytogenes NOT DETECTED NOT DETECTED Final   Staphylococcus species DETECTED (A) NOT DETECTED Final    Comment: CRITICAL RESULT CALLED TO, READ BACK BY AND VERIFIED WITH: J MILLEN PHARMD 07/06/19 2058 JDW    Staphylococcus aureus (BCID) DETECTED (A) NOT DETECTED Final    Comment: Methicillin (oxacillin) susceptible Staphylococcus aureus (MSSA). Preferred therapy is anti staphylococcal beta lactam antibiotic (Cefazolin or Nafcillin), unless clinically contraindicated. CRITICAL RESULT CALLED TO, READ BACK BY AND VERIFIED WITH: J MILLEN PHARMD 07/06/19 2058 JDW    Methicillin resistance NOT DETECTED NOT DETECTED Final   Streptococcus  species NOT DETECTED NOT DETECTED Final   Streptococcus agalactiae NOT DETECTED NOT DETECTED Final   Streptococcus pneumoniae NOT DETECTED NOT DETECTED Final   Streptococcus pyogenes NOT DETECTED NOT DETECTED Final   Acinetobacter baumannii NOT DETECTED NOT DETECTED Final   Enterobacteriaceae species NOT DETECTED NOT DETECTED Final   Enterobacter cloacae complex NOT DETECTED NOT DETECTED Final   Escherichia coli NOT DETECTED NOT DETECTED Final   Klebsiella oxytoca NOT DETECTED NOT  DETECTED Final   Klebsiella pneumoniae NOT DETECTED NOT DETECTED Final   Proteus species NOT DETECTED NOT DETECTED Final   Serratia marcescens NOT DETECTED NOT DETECTED Final   Haemophilus influenzae NOT DETECTED NOT DETECTED Final   Neisseria meningitidis NOT DETECTED NOT DETECTED Final   Pseudomonas aeruginosa NOT DETECTED NOT DETECTED Final   Candida albicans NOT DETECTED NOT DETECTED Final   Candida glabrata NOT DETECTED NOT DETECTED Final   Candida krusei NOT DETECTED NOT DETECTED Final   Candida parapsilosis NOT DETECTED NOT DETECTED Final   Candida tropicalis NOT DETECTED NOT DETECTED Final    Comment: Performed at [facility], [place] [address], [place], [place] [address]  Urine culture     Status: Abnormal   Collection Time: 07/06/19  4:37 AM   Specimen: Urine, Random  Result Value Ref Range Status   Specimen Description URINE, RANDOM  Final   Special Requests   Final    NONE Performed at [facility], [place] [address], [place], [place] [address]    Culture (A)  Final    >=100,000 COLONIES/mL MULTIPLE SPECIES PRESENT, SUGGEST RECOLLECTION   Report Status 07/07/2019 FINAL  Final  SARS Coronavirus 2 [facility] order, Performed in [facility] hospital lab) Nasopharyngeal Nasopharyngeal Swab     Status: None   Collection Time: 07/06/19  8:04 AM   Specimen: Nasopharyngeal Swab  Result Value Ref Range Status   SARS Coronavirus 2 NEGATIVE NEGATIVE Final    Comment: (NOTE) If result is NEGATIVE SARS-CoV-2 target nucleic acids are NOT DETECTED. The SARS-CoV-2 RNA is generally detectable in upper and lower  respiratory specimens during the acute phase of infection. The lowest  concentration of SARS-CoV-2 viral copies this assay can detect is 250  copies / mL. A negative result does not preclude SARS-CoV-2 infection  and should not be used as the sole basis for treatment or other  patient management decisions.  A negative result may occur with  improper specimen collection  / handling, submission of specimen other  than nasopharyngeal swab, presence of viral mutation(s) within the  areas targeted by this assay, and inadequate number of viral copies  (<250 copies / mL). A negative result must be combined with clinical  observations, patient history, and epidemiological information. If result is POSITIVE SARS-CoV-2 target nucleic acids are DETECTED. The SARS-CoV-2 RNA is generally detectable in upper and lower  respiratory specimens dur ing the acute phase of infection.  Positive  results are indicative of active infection with SARS-CoV-2.  Clinical  correlation with patient history and other diagnostic information is  necessary to determine patient infection status.  Positive results do  not rule out bacterial infection or co-infection with other viruses. If result is PRESUMPTIVE POSTIVE SARS-CoV-2 nucleic acids MAY BE PRESENT.   A presumptive positive result was obtained on the submitted specimen  and confirmed on repeat testing.  While 2019 novel coronavirus  (SARS-CoV-2) nucleic acids may be present in the submitted sample  additional confirmatory testing may be necessary for epidemiological  and /  or clinical management purposes  to differentiate between  SARS-CoV-2 and other Sarbecovirus currently known to infect humans.  If clinically indicated additional testing with an alternate test  methodology [phone]) is advised. The SARS-CoV-2 RNA is generally  detectable in upper and lower respiratory sp ecimens during the acute  phase of infection. The expected result is Negative. Fact Sheet for Patients:  StrictlyIdeas.no Fact Sheet for Healthcare Providers: BankingDealers.co.za This test is not yet approved or cleared by the [place] FDA and has been authorized for detection and/or diagnosis of SARS-CoV-2 by FDA under an Emergency Use Authorization (EUA).  This EUA will remain in effect (meaning this  test can be used) for the duration of the COVID-19 declaration under Section 564(b)(1) of the Act, 21 U.S.C. section 360bbb-3(b)(1), unless the authorization is terminated or revoked sooner. Performed at [facility], [place] [address], [place], [place] [address]   Culture, fungus without smear     Status: None (Preliminary result)   Collection Time: 07/06/19  6:48 PM   Specimen: Abscess; Other  Result Value Ref Range Status   Specimen Description ABSCESS PARASPINAL  Final   Special Requests NONE  Final   Culture   Final    NO FUNGUS ISOLATED AFTER 3 DAYS Performed at [facility], [address], [place], [place] [address]    Report Status PENDING  Incomplete  Aerobic/Anaerobic Culture (surgical/deep wound)     Status: None   Collection Time: 07/06/19  6:48 PM   Specimen: Abscess  Result Value Ref Range Status   Specimen Description ABSCESS  Final   Special Requests PARASPINAL  Final   Gram Stain   Final    FEW WBC PRESENT, PREDOMINANTLY PMN ABUNDANT GRAM POSITIVE COCCI    Culture   Final    ABUNDANT STAPHYLOCOCCUS AUREUS NO ANAEROBES ISOLATED Performed at [facility], [place] [address], [place], [place] [address]    Report Status 07/12/2019 FINAL  Final   Organism ID, Bacteria STAPHYLOCOCCUS AUREUS  Final      Susceptibility   Staphylococcus aureus - MIC*    CIPROFLOXACIN <=0.5 SENSITIVE Sensitive     ERYTHROMYCIN >=8 RESISTANT Resistant     GENTAMICIN <=0.5 SENSITIVE Sensitive     OXACILLIN <=0.25 SENSITIVE Sensitive     TETRACYCLINE >=16 RESISTANT Resistant     VANCOMYCIN 1 SENSITIVE Sensitive     TRIMETH/SULFA <=10 SENSITIVE Sensitive     CLINDAMYCIN RESISTANT Resistant     RIFAMPIN <=0.5 SENSITIVE Sensitive     Inducible Clindamycin POSITIVE Resistant     * ABUNDANT STAPHYLOCOCCUS AUREUS  Aerobic/Anaerobic Culture (surgical/deep wound)     Status: None (Preliminary result)   Collection Time: 07/09/19  8:32 AM   Specimen: Abscess  Result Value Ref  Range Status   Specimen Description ABSCESS HIP RIGHT  Final   Special Requests NONE  Final   Gram Stain   Final    MODERATE WBC PRESENT,BOTH PMN AND MONONUCLEAR ABUNDANT GRAM POSITIVE COCCI IN PAIRS IN CLUSTERS Performed at [facility], [address] [place], [place] [address]    Culture   Final    FEW STAPHYLOCOCCUS AUREUS NO ANAEROBES ISOLATED; CULTURE IN PROGRESS FOR 5 DAYS    Report Status PENDING  Incomplete   Organism ID, Bacteria STAPHYLOCOCCUS AUREUS  Final      Susceptibility   Staphylococcus aureus - MIC*    CIPROFLOXACIN <=0.5 SENSITIVE Sensitive     ERYTHROMYCIN >=8 RESISTANT Resistant  GENTAMICIN <=0.5 SENSITIVE Sensitive     OXACILLIN <=0.25 SENSITIVE Sensitive     TETRACYCLINE >=16 RESISTANT Resistant     VANCOMYCIN 1 SENSITIVE Sensitive     TRIMETH/SULFA <=10 SENSITIVE Sensitive     CLINDAMYCIN RESISTANT Resistant     RIFAMPIN <=0.5 SENSITIVE Sensitive     Inducible Clindamycin POSITIVE Resistant     * FEW STAPHYLOCOCCUS AUREUS  Culture, blood (routine x 2)     Status: None (Preliminary result)   Collection Time: 07/09/19 12:57 PM   Specimen: BLOOD RIGHT HAND  Result Value Ref Range Status   Specimen Description BLOOD RIGHT HAND  Final   Special Requests   Final    AEROBIC BOTTLE ONLY Blood Culture results may not be optimal due to an inadequate volume of blood received in culture bottles   Culture   Final    NO GROWTH 2 DAYS Performed at [facility], [place] [address] [place], [place] [address]    Report Status PENDING  Incomplete  Culture, blood (routine x 2)     Status: None (Preliminary result)   Collection Time: 07/09/19  1:06 PM   Specimen: BLOOD RIGHT HAND  Result Value Ref Range Status   Specimen Description BLOOD RIGHT HAND  Final   Special Requests AEROBIC BOTTLE ONLY Blood Culture adequate volume  Final   Culture   Final    NO GROWTH 2 DAYS Performed at [facility], [place] [address], [place], [place] [address]     Report Status PENDING  Incomplete    Radiology Studies: Dg Abd 1 View  Result Date: 07/12/2019 CLINICAL DATA:  NG tube placement EXAM: ABDOMEN - 1 VIEW COMPARISON:  None. FINDINGS: Nasogastric tube tip and side port project over the stomach. Lung bases are clear. No dilated bowel is visible. IMPRESSION: NG tube tip and side port project over the stomach. Electronically Signed   By: Ulyses Jarred M.D.   On: 07/12/2019 00:33   Dg Abd 1 View  Result Date: 07/11/2019 CLINICAL DATA:  Nausea and vomiting with abdominal pain EXAM: ABDOMEN - 1 VIEW COMPARISON:  07/06/2019 FINDINGS: Marked gaseous dilatation of the stomach. Otherwise nonobstructed gas pattern with gas in the small and large bowel. Findings could be secondary to enteritis or mild ileus. IMPRESSION: 1. Incompletely visualized marked gaseous dilatation of stomach, consider NG tube decompression. Outlet obstruction could be considered. 2. Remainder of the gas pattern shows air-filled small and large bowel in a nonobstructed pattern, question enteritis or mild ileus. Electronically Signed   By: Donavan Foil M.D.   On: 07/11/2019 21:58     T. Brent  If 7PM-7AM, please contact night-coverage [email or web address] Password [email or web address] 07/12/2019, 2:50 PM

## 2019-07-12 NOTE — Progress Notes (Signed)
Ortho Trauma Progress Note  Patient feels somewhat improved however he still is having pain in his hip.  He has limited range of motion of his leg but does feel this continues to improve some each day.   Right lower extremity dressing removed, incision is clean, dry, intact. The Hemovac drain has some serous drainage with some particulate in it.  He states that output from Hemovac has slowed down since yesterday. Does not look comparable to the pus that was appreciated in the operating room.  He has active dorsiflexion plantarflexion of his ankle.  Limited movement of his hip and knee secondary to pain and discomfort. 2+ DP pulse  Results for orders placed or performed during the hospital encounter of 07/05/19 (from the past 24 hour(s))  Glucose, capillary     Status: Abnormal   Collection Time: 07/11/19  4:13 PM  Result Value Ref Range   Glucose-Capillary 141 (H) 70 - 99 mg/dL  Glucose, capillary     Status: Abnormal   Collection Time: 07/11/19  9:56 PM  Result Value Ref Range   Glucose-Capillary 146 (H) 70 - 99 mg/dL  Basic metabolic panel     Status: Abnormal   Collection Time: 07/12/19  3:14 AM  Result Value Ref Range   Sodium 131 (L) 135 - 145 mmol/L   Potassium 3.4 (L) 3.5 - 5.1 mmol/L   Chloride 89 (L) 98 - 111 mmol/L   CO2 28 22 - 32 mmol/L   Glucose, Bld 187 (H) 70 - 99 mg/dL   BUN <5 (L) 6 - 20 mg/dL   Creatinine, Ser 0.59 (L) 0.61 - 1.24 mg/dL   Calcium 8.1 (L) 8.9 - 10.3 mg/dL   GFR calc non Af Amer >60 >60 mL/min   GFR calc Af Amer >60 >60 mL/min   Anion gap 14 5 - 15  CBC     Status: Abnormal   Collection Time: 07/12/19  3:14 AM  Result Value Ref Range   WBC 10.0 4.0 - 10.5 K/uL   RBC 3.39 (L) 4.22 - 5.81 MIL/uL   Hemoglobin 10.1 (L) 13.0 - 17.0 g/dL   HCT 31.1 (L) 39.0 - 52.0 %   MCV 91.7 80.0 - 100.0 fL   MCH 29.8 26.0 - 34.0 pg   MCHC 32.5 30.0 - 36.0 g/dL   RDW 12.7 11.5 - 15.5 %   Platelets 334 150 - 400 K/uL   nRBC 0.4 (H) 0.0 - 0.2 %  Magnesium      Status: None   Collection Time: 07/12/19  3:14 AM  Result Value Ref Range   Magnesium 1.9 1.7 - 2.4 mg/dL  Glucose, capillary     Status: Abnormal   Collection Time: 07/12/19  8:11 AM  Result Value Ref Range   Glucose-Capillary 170 (H) 70 - 99 mg/dL  Glucose, capillary     Status: Abnormal   Collection Time: 07/12/19 12:23 PM  Result Value Ref Range   Glucose-Capillary 174 (H) 70 - 99 mg/dL    Assessment and plan: 48 year old male with multifocal abscesses status post I&D right gluteal abscess with extension into the pelvis.  Patient had a significantly large abscess and will have significant pain for a number of weeks.  The drain does not look overtly purulent compared to what it was intraoperatively.  I do not feel that a repeat I&D is warranted.  Continue with IV antibiotics.  We will continue evaluate. Will plan to leave drain in for one more day, plan to remove tomorrow.  Iyanla Eilers A. [facility] ?([phone]? (phone)

## 2019-07-12 NOTE — Progress Notes (Signed)
Physical Therapy Treatment Patient Details Name: Bradley Cox MRN: [id number] DOB: 12-01-1970 Today's Date: 07/12/2019    History of Present Illness Patient is a 48 y/o male who presents with right flank pain. Admitted with disseminated MSSA infection including bacteremia, Lumbosacral spinal epidural abscess, paraspinal/large gluteal/pelvic abscess with sciatica impingement. s/p I&D large gluteal abscess and neuroplasty of sciatica nerve 9/7. s/p CT aspiration right paraspinal abscess. PMH includes DM, HTN, morbid obesity, OSA.    PT Comments    Pt requiring less assist overall for bed mobility this session, but unable to transfer. Attempted Stedy, but pt scooting hips too far anteriorly onto edge of bed and screaming out in pain; assisted pt back into supine position for comfort. Pt declining pain medications prior to session. Continues with significant proximal hip weakness, worked on isometric exercises for strengthening. Will need post acute rehab services.     Follow Up Recommendations  CIR;Supervision for mobility/OOB;Supervision/Assistance - 24 hour     Equipment Recommendations  Other (comment)(defer)    Recommendations for Other Services       Precautions / Restrictions Precautions Precautions: Fall Precaution Comments: hemovac Restrictions Weight Bearing Restrictions: Yes RLE Weight Bearing: Weight bearing as tolerated    Mobility  Bed Mobility Overal bed mobility: Needs Assistance Bed Mobility: Supine to Sit;Rolling;Sit to Supine     Supine to sit: +2 for physical assistance;HOB elevated;Mod assist Sit to supine: Mod assist;+2 for physical assistance   General bed mobility comments: ModA + 2 to bring RLE to edge of bed and pull up trunk. And assist to bring RLE back into bed. Good initiation  Transfers                 General transfer comment: deferred this session  Ambulation/Gait                 Stairs             Wheelchair  Mobility    Modified Rankin (Stroke Patients Only)       Balance Overall balance assessment: Needs assistance Sitting-balance support: Feet supported;Bilateral upper extremity supported Sitting balance-Leahy Scale: Fair Sitting balance - Comments: Requires BUE support as position of comfort. Offloading right hip.                                    Cognition Arousal/Alertness: Awake/alert Behavior During Therapy: WFL for tasks assessed/performed Overall Cognitive Status: Within Functional Limits for tasks assessed                                 General Comments: Internally distracted by wanting water and pain      Exercises General Exercises - Lower Extremity Ankle Circles/Pumps: Right;10 reps;Supine Quad Sets: Right;15 reps;Supine Short Arc Quad: Right;10 reps;Supine Heel Slides: Right;AAROM;10 reps;Supine Other Exercises Other Exercises: Glute sets x 15    General Comments        Pertinent Vitals/Pain Pain Assessment: Faces Faces Pain Scale: Hurts worst Pain Location: Butt and RLE esp with movement Pain Descriptors / Indicators: Constant;Discomfort;Grimacing;Moaning Pain Intervention(s): Monitored during session;Patient requesting pain meds-RN notified;Limited activity within patient's tolerance;Repositioned    Home Living                      Prior Function            PT Goals (current  goals can now be found in the care plan section) Acute Rehab PT Goals Patient Stated Goal: to get back home PT Goal Formulation: With patient Time For Goal Achievement: 07/24/19 Potential to Achieve Goals: Fair Progress towards PT goals: Not progressing toward goals - comment(pain)    Frequency    Min 3X/week      PT Plan Current plan remains appropriate    Co-evaluation              AM-PAC PT "6 Clicks" Mobility   Outcome Measure  Help needed turning from your back to your side while in a flat bed without using  bedrails?: A Lot Help needed moving from lying on your back to sitting on the side of a flat bed without using bedrails?: A Lot Help needed moving to and from a bed to a chair (including a wheelchair)?: Total Help needed standing up from a chair using your arms (e.g., wheelchair or bedside chair)?: Total Help needed to walk in hospital room?: Total Help needed climbing 3-5 steps with a railing? : Total 6 Click Score: 8    End of Session   Activity Tolerance: Patient limited by pain Patient left: in bed;with call bell/phone within reach;with bed alarm set Nurse Communication: Mobility status PT Visit Diagnosis: Muscle weakness (generalized) (M62.81);Pain;Unsteadiness on feet (R26.81);Difficulty in walking, not elsewhere classified (R26.2) Pain - Right/Left: Right Pain - part of body: Leg     Time: [id number] PT Time Calculation (min) (ACUTE ONLY): 44 min  Charges:  $Therapeutic Exercise: 8-22 mins $Therapeutic Activity: 23-37 mins                     Ellamae Sia, PT, DPT Acute Rehabilitation Services Pager [phone] Office [phone]    Willy Eddy 07/12/2019, 11:23 AM

## 2019-07-12 NOTE — Progress Notes (Signed)
Inpatient Rehabilitation Admissions Coordinator  Inpatient rehab consult received. I met with patient and his Mom at bedside for rehab assessment. Pt is in significant pain and unable to tolerate the intensity of an inpt rehab admit yet. Pt uninsured so I will contact financial counselor to assess for coverage and options. We will follow his progress.  Danne Baxter, RN, MSN Rehab Admissions Coordinator [phone] 07/12/2019 10:49 AM

## 2019-07-12 NOTE — Progress Notes (Signed)
Inpatient Diabetes Program Recommendations  AACE/ADA: New Consensus Statement on Inpatient Glycemic Control (2015)  Target Ranges:  Prepandial:   less than 140 mg/dL      Peak postprandial:   less than 180 mg/dL (1-2 hours)      Critically ill patients:  140 - 180 mg/dL   Lab Results  Component Value Date   GLUCAP 174 (H) 07/12/2019   HGBA1C 12.6 (H) 07/07/2019    Spoke with patient in regards to A1c level, 12.6% and needing insulin at time of d/c.  Patient will need follow up scheduled at clinic in order to get insulin. Back up plan [employer] insulin.  Spoke with patient about the type of insulin NPH and to take it bid. Also spoke about the meal coverage he was taking.   Discussed insulin delivery. Patient has helped his father with injections of both vial and syringe and insulin pen. Patient is comfortable delivering insulin injections.  Discussed glucose and A1c goals.  Discussed s/s hypo and hyperglycemia and treatment for both.  Thanks,  Tama Headings RN, MSN, BC-ADM Inpatient Diabetes Coordinator Team Pager [phone] (8a-5p)

## 2019-07-13 DIAGNOSIS — L03115 Cellulitis of right lower limb: Secondary | ICD-10-CM

## 2019-07-13 LAB — BASIC METABOLIC PANEL
Anion gap: 10 (ref 5–15)
BUN: <5 mg/dL — ABNORMAL LOW (ref 6–20)
CO2: 29 mmol/L (ref 22–32)
Calcium: 8.1 mg/dL — ABNORMAL LOW (ref 8.9–10.3)
Chloride: 92 mmol/L — ABNORMAL LOW (ref 98–111)
Creatinine, Ser: 0.53 mg/dL — ABNORMAL LOW (ref 0.61–1.24)
GFR calc Af Amer: >60 mL/min (ref >60)
GFR calc non Af Amer: >60 mL/min (ref >60)
Glucose, Bld: 141 mg/dL — ABNORMAL HIGH (ref 70–99)
Potassium: 4.1 mmol/L (ref 3.5–5.1)
Sodium: 131 mmol/L — ABNORMAL LOW (ref 135–145)

## 2019-07-13 LAB — MAGNESIUM: Magnesium: 1.9 mg/dL (ref 1.7–2.4)

## 2019-07-13 LAB — GLUCOSE, CAPILLARY
Glucose-Capillary: 140 mg/dL — ABNORMAL HIGH (ref 70–99)
Glucose-Capillary: 210 mg/dL — ABNORMAL HIGH (ref 70–99)
Glucose-Capillary: 137 mg/dL — ABNORMAL HIGH (ref 70–99)
Glucose-Capillary: 58 mg/dL — ABNORMAL LOW (ref 70–99)
Glucose-Capillary: 127 mg/dL — ABNORMAL HIGH (ref 70–99)

## 2019-07-13 LAB — CBC
HCT: 33.4 % — ABNORMAL LOW (ref 39.0–52.0)
Hemoglobin: 10.4 g/dL — ABNORMAL LOW (ref 13.0–17.0)
MCH: 30.3 pg (ref 26.0–34.0)
MCHC: 31.1 g/dL (ref 30.0–36.0)
MCV: 97.4 fL (ref 80.0–100.0)
Platelets: 372 10*3/uL (ref 150–400)
RBC: 3.43 MIL/uL — ABNORMAL LOW (ref 4.22–5.81)
RDW: 13.2 % (ref 11.5–15.5)
WBC: 11.5 10*3/uL — ABNORMAL HIGH (ref 4.0–10.5)
nRBC: 0.4 % — ABNORMAL HIGH (ref 0.0–0.2)

## 2019-07-13 MED ORDER — SENNOSIDES-DOCUSATE SODIUM 8.6-50 MG PO TABS
1.0000 | ORAL_TABLET | Freq: Two times a day (BID) | ORAL | Status: DC
  Administered 2019-07-13 – 2019-08-16 (×55): 1 via ORAL
  Filled 2019-07-13 (×59): qty 1

## 2019-07-13 MED ORDER — METOCLOPRAMIDE HCL 10 MG PO TABS
5.0000 mg | ORAL_TABLET | Freq: Three times a day (TID) | ORAL | Status: DC
  Administered 2019-07-13 – 2019-07-16 (×11): 5 mg via ORAL
  Filled 2019-07-13 (×12): qty 1

## 2019-07-13 NOTE — Progress Notes (Signed)
PHARMACY CONSULT NOTE FOR:  OUTPATIENT  PARENTERAL ANTIBIOTIC THERAPY (OPAT)  Indication: MSSA Bacteremia/ Epidural abscess Regimen: Cefazolin 2g q8h End date: 09/03/2019  IV antibiotic discharge orders are pended. To discharging provider:  please sign these orders via discharge navigator,  Select New Orders & click on the button choice - Manage This Unsigned Work.     Thank you for allowing pharmacy to be a part of this patient's care.  Phillis Haggis 07/13/2019, 9:06 AM

## 2019-07-13 NOTE — Progress Notes (Signed)
Occupational Therapy Treatment Patient Details Name: Bradley Cox MRN: [id number] DOB: 1971-10-15 Today's Date: 07/13/2019    History of present illness Patient is a 48 y/o male who presents with right flank pain. Admitted with disseminated MSSA infection including bacteremia, Lumbosacral spinal epidural abscess, paraspinal/large gluteal/pelvic abscess with sciatica impingement. s/p I&D large gluteal abscess and neuroplasty of sciatica nerve 9/7. s/p CT aspiration right paraspinal abscess. PMH includes DM, HTN, morbid obesity, OSA.   OT comments  Pt seen with PT this session for functional mobility progression. Pt able to sit >stand this session with MOD +2 with Bari RW. Pt benefits from rocking forward for momentum for sit>stand. Pt able to stand pivot MOD+2 and bari RW requiring cues for technique and balance in standing. Pt limited by pain, but able to progress to OOB. Feel pt still great candidate for CIR level therapies to maximize functional independence to return to PLOF. Will continue to follow for acute OT needs.   Follow Up Recommendations  CIR;Supervision/Assistance - 24 hour    Equipment Recommendations  Other (comment)(tbd to next venue)    Recommendations for Other Services      Precautions / Restrictions Precautions Precautions: Fall Precaution Comments: hemovac Restrictions Weight Bearing Restrictions: Yes RLE Weight Bearing: Weight bearing as tolerated       Mobility Bed Mobility Overal bed mobility: Needs Assistance Bed Mobility: Supine to Sit     Supine to sit: +2 for physical assistance;HOB elevated;Mod assist     General bed mobility comments: MOD A +2 to bring RLE to EOB and elevated trunk with HOB elevated slightly  Transfers Overall transfer level: Needs assistance Equipment used: Rolling walker (2 wheeled)(Bariatric) Transfers: Sit to/from [facility] Sit to Stand: Mod assist;+2 physical assistance;From elevated surface(helpful to  rock forward for momentum) Stand pivot transfers: Mod assist;+2 physical assistance;+2 safety/equipment       General transfer comment: Able to stand pivot this sessio; MOD +2 for balance and cues for technique; limited by pain overall    Balance Overall balance assessment: Needs assistance Sitting-balance support: Feet supported;Bilateral upper extremity supported   Sitting balance - Comments: Requires BUE support as position of comfort. Offloading right hip.   Standing balance support: Bilateral upper extremity supported Standing balance-Leahy Scale: Poor Standing balance comment: reliant on BUE during funcitonal mobility                           ADL either performed or assessed with clinical judgement   ADL Overall ADL's : Needs assistance/impaired                         Toilet Transfer: Moderate assistance;+2 for physical assistance;RW;+2 for safety/equipment Toilet Transfer Details (indicate cue type and reason): simualted from EOB>recliner MOD +2 with bari RW; more success with counting to 3 and rocking forward to gain momentum         Functional mobility during ADLs: Moderate assistance;+2 for physical assistance;+2 for safety/equipment General ADL Comments: Pt limited by pain and tolerating sitting at EOB and then performing sit<>Stand MOD +2. suspect  increased assistance for ADLs     Vision       Perception     Praxis      Cognition Arousal/Alertness: Awake/alert Behavior During Therapy: WFL for tasks assessed/performed Overall Cognitive Status: Within Functional Limits for tasks assessed  Exercises     Shoulder Instructions       General Comments      Pertinent Vitals/ Pain       Pain Assessment: Faces Faces Pain Scale: Hurts worst Pain Location: RLE Pain Descriptors / Indicators: Constant;Discomfort;Grimacing;Moaning Pain Intervention(s): Monitored during  session;Limited activity within patient's tolerance;Repositioned  Home Living                                          Prior Functioning/Environment              Frequency  Min 3X/week        Progress Toward Goals  OT Goals(current goals can now be found in the care plan section)  Progress towards OT goals: Progressing toward goals  Acute Rehab OT Goals Time For Goal Achievement: 07/24/19 Potential to Achieve Goals: Good  Plan      Co-evaluation    PT/OT/SLP Co-Evaluation/Treatment: Yes Reason for Co-Treatment: Complexity of the patient's impairments (multi-system involvement);Necessary to address cognition/behavior during functional activity;For patient/therapist safety   OT goals addressed during session: ADL's and self-care      AM-PAC OT "6 Clicks" Daily Activity     Outcome Measure   Help from another person eating meals?: None Help from another person taking care of personal grooming?: A Little Help from another person toileting, which includes using toliet, bedpan, or urinal?: A Lot Help from another person bathing (including washing, rinsing, drying)?: A Lot Help from another person to put on and taking off regular upper body clothing?: A Lot Help from another person to put on and taking off regular lower body clothing?: Total 6 Click Score: 14    End of Session Equipment Utilized During Treatment: Rolling walker(bariatric)  OT Visit Diagnosis: Unsteadiness on feet (R26.81);Other abnormalities of gait and mobility (R26.89);Muscle weakness (generalized) (M62.81);Other symptoms and signs involving cognitive function;Pain   Activity Tolerance Patient limited by pain   Patient Left in chair;with call bell/phone within reach;with bed alarm set;with family/visitor present   Nurse Communication          Time: [id number] OT Time Calculation (min): 31 min  Charges: OT General Charges $OT Visit: 1 Visit OT Treatments $Therapeutic  Activity: 23-37 mins  [place], COTA/L Acute Rehabilitation Services [phone] [place] 07/13/2019, 11:40 AM

## 2019-07-13 NOTE — Progress Notes (Signed)
Physical Therapy Treatment Patient Details Name: Bradley Cox MRN: [id number] DOB: 02/12/1971 Today's Date: 07/13/2019    History of Present Illness Patient is a 48 y/o male who presents with right flank pain. Admitted with disseminated MSSA infection including bacteremia, Lumbosacral spinal epidural abscess, paraspinal/large gluteal/pelvic abscess with sciatica impingement. s/p I&D large gluteal abscess and neuroplasty of sciatica nerve 9/7. s/p CT aspiration right paraspinal abscess. PMH includes DM, HTN, morbid obesity, OSA.    PT Comments    Pt making good progress towards physical therapy goals today. Able to perform stand pivot transfer using bariatric walker and two person moderate assist. Pain remains a barrier but pt is highly motivated to participate. Continues with gross right lower extremity weakness. Continue to recommend comprehensive inpatient rehab (CIR) for post-acute therapy needs.   Follow Up Recommendations  CIR;Supervision for mobility/OOB;Supervision/Assistance - 24 hour     Equipment Recommendations  Rolling walker with 5" wheels;3in1 (PT)    Recommendations for Other Services       Precautions / Restrictions Precautions Precautions: Fall Precaution Comments: hemovac Restrictions Weight Bearing Restrictions: Yes RLE Weight Bearing: Weight bearing as tolerated    Mobility  Bed Mobility Overal bed mobility: Needs Assistance Bed Mobility: Supine to Sit     Supine to sit: +2 for physical assistance;HOB elevated;Mod assist     General bed mobility comments: Mod A +2 to bring RLE to EOB and elevated trunk with HOB elevated slightly  Transfers Overall transfer level: Needs assistance Equipment used: Rolling walker (2 wheeled) Transfers: Sit to/from [facility] Sit to Stand: Mod assist;+2 physical assistance;From elevated surface Stand pivot transfers: Mod assist;+2 physical assistance;+2 safety/equipment       General transfer  comment: ModA + 2 to stand from elevated bed surface, cues for rocking forward to gain momentum, pivoting towards left. cues for sequencing, direction  Ambulation/Gait                 Stairs             Wheelchair Mobility    Modified Rankin (Stroke Patients Only)       Balance Overall balance assessment: Needs assistance Sitting-balance support: Feet supported;Bilateral upper extremity supported Sitting balance-Leahy Scale: Fair Sitting balance - Comments: Requires BUE support as position of comfort. Offloading right hip.   Standing balance support: Bilateral upper extremity supported Standing balance-Leahy Scale: Poor Standing balance comment: reliant on BUE during funcitonal mobility                            Cognition Arousal/Alertness: Awake/alert Behavior During Therapy: WFL for tasks assessed/performed Overall Cognitive Status: Within Functional Limits for tasks assessed                                        Exercises General Exercises - Lower Extremity Long Arc Quad: Right;5 reps;Seated    General Comments        Pertinent Vitals/Pain Pain Assessment: Faces Faces Pain Scale: Hurts whole lot Pain Location: RLE Pain Descriptors / Indicators: Constant;Discomfort;Grimacing;Moaning Pain Intervention(s): Limited activity within patient's tolerance;Monitored during session;Premedicated before session    Home Living                      Prior Function            PT Goals (current goals can now  be found in the care plan section) Acute Rehab PT Goals Patient Stated Goal: to get back home Potential to Achieve Goals: Good Progress towards PT goals: Progressing toward goals    Frequency    Min 3X/week      PT Plan Current plan remains appropriate    Co-evaluation PT/OT/SLP Co-Evaluation/Treatment: Yes Reason for Co-Treatment: For patient/therapist safety;To address functional/ADL transfers PT goals  addressed during session: Mobility/safety with mobility OT goals addressed during session: ADL's and self-care      AM-PAC PT "6 Clicks" Mobility   Outcome Measure  Help needed turning from your back to your side while in a flat bed without using bedrails?: A Lot Help needed moving from lying on your back to sitting on the side of a flat bed without using bedrails?: A Lot Help needed moving to and from a bed to a chair (including a wheelchair)?: A Lot Help needed standing up from a chair using your arms (e.g., wheelchair or bedside chair)?: A Lot Help needed to walk in hospital room?: A Lot Help needed climbing 3-5 steps with a railing? : Total 6 Click Score: 11    End of Session   Activity Tolerance: Patient tolerated treatment well Patient left: in chair;with call bell/phone within reach;with family/visitor present Nurse Communication: Mobility status PT Visit Diagnosis: Muscle weakness (generalized) (M62.81);Pain;Unsteadiness on feet (R26.81);Difficulty in walking, not elsewhere classified (R26.2) Pain - Right/Left: Right Pain - part of body: Leg     Time: [id number] PT Time Calculation (min) (ACUTE ONLY): 31 min  Charges:  $Therapeutic Activity: 8-22 mins                     Ellamae Sia, PT, DPT Acute Rehabilitation Services Pager [phone] Office [phone]    Willy Eddy 07/13/2019, 1:25 PM

## 2019-07-13 NOTE — Progress Notes (Addendum)
[facility]  Date of Admission:  07/05/2019      Total days of antibiotics 8  Day 6 cefazolin          ASSESSMENT: Bradley Cox is a 48 y.o. male with MSSA Bacteremia complicated by [id number] spinal epidural abscess with paraspinal abscesses and large right gluteal abscess now s/p I&D; hema vac drain remains. He was bacteremic with MSSA at presentation.   MSSA Bacteremia = TEE indicates echodensity that favors redundant chorde of AV; he has no significant valvular dysfunction or compromised EF otherwise. He is due for a long course of IV antibiotics that would sufficiently cover endocarditis on the low likelihood that this finding represented that; No need to repeat TEE from ID perspective to follow given that it will not alter duration of treatment. His blood cultures show clearance at 72 hours - OK to place PICC line.   L3-S1 Epidural Abscess = 8 week course IV therapy to target MSSA, which has been recovered from all cultures taken including blood and right hip. NSGY has seen and no indication for surgical intervention.   Gluteal Abscess = drain remains. One more day per ortho team's notes. He is being recommended CIR for therapy after he is medically ready from current hospitalization. Will keep the metronidazole going for now given size of abscess cavity and drain   R Calf Erythema/Swelling = no DVT or abscess noted on U/S. Still red and swollen but not tender.   Penicillin allergy - he is tolerating the cefazolin well. We discussed with him performing PCN allergy testing while inpatient and he would like to pursue this.    New Diabetes diagnosis - inpatient coordinator following him and assisting with recs  Localized Rash to R FA - improving, likely contact derm from tape. Would wait to clear up more prior to PCN skin testing.    PLAN: 1. Continue cefazolin --> November 2nd would be his 8 week end date 2. OK to place PICC line  3. Stop flagyl  4. PCN skin testing planned for Monday. He will need a short term observation in Step Down/progressive care to achieve this if he passes the skin testing and goes on to the oral challenge.   Will follow back up on Monday. Please call with any concerns related to antibiotics.    Principal Problem:   MSSA bacteremia Active Problems:   Sepsis ([place])   Abscess of right hip   Paraspinal abscess (HCC)   Obesity, Class III, BMI 40-49.9 (morbid obesity) (HCC)   Essential hypertension   AKI (acute kidney injury) (HCC)   Hyperkalemia   Compression of right sciatic nerve   . acetaminophen  650 mg Oral Q6H   Or  . acetaminophen  650 mg Rectal Q6H  . enoxaparin (LOVENOX) injection  0.5 mg/kg Subcutaneous Q24H  . gabapentin  300 mg Oral TID  . hydrALAZINE  25 mg Oral BID  . insulin aspart  0-15 Units Subcutaneous TID WC  . insulin aspart  0-5 Units Subcutaneous QHS  . insulin aspart  8 Units Subcutaneous TID WC  . insulin NPH Human  20 Units Subcutaneous BID AC & HS  . metoCLOPramide  5 mg Oral TID AC  . polyethylene glycol  17 g Oral BID  . senna-docusate  1 tablet Oral BID  . sodium chloride flush  3 mL Intravenous Q12H     SUBJECTIVE: Tough day yesterday with vomiting and NG tube  placement. This has been removed (could not tolerate). Passing flatus and belching. Right hip pain is still present but feels it is getting better. Drain was removed today.   Temp Readings from Last 1 Encounters:  07/13/19 98 F (36.7 C) (Oral)   Lab Results  Component Value Date   WBC 11.5 (H) 07/13/2019   Lab Results  Component Value Date   CREATININE 0.53 (L) 07/13/2019   Lab Results  Component Value Date   CRP 43.5 (H) 07/06/2019    Review of Systems: Review of Systems  Constitutional: Negative for chills and fever.  Respiratory: Negative for cough and sputum production.   Cardiovascular: Positive for leg swelling (R>L). Negative for chest pain.  Gastrointestinal: Negative for abdominal  pain and vomiting.  Genitourinary: Negative for dysuria.  Musculoskeletal: Positive for back pain and joint pain (R hip / leg).  Skin: Positive for rash (rash on right forearm).  Neurological: Negative for dizziness and headaches.    Allergies  Allergen Reactions  . Penicillins Anaphylaxis    Did it involve swelling of the face/tongue/throat, SOB, or low BP? Yes Did it involve sudden or severe rash/hives, skin peeling, or any reaction on the inside of your mouth or nose? No Did you need to seek medical attention at a hospital or doctor's office? Yes When did it last happen?4 months old If all above answers are "NO", may proceed with cephalosporin use.   . Tramadol Anaphylaxis    OBJECTIVE: Vitals:   07/13/19 1100 07/13/19 1153 07/13/19 1200 07/13/19 1325  BP:    124/86  Pulse: 94  91 92  Resp: 19  (!) 22 18  Temp:  98.9 F (37.2 C)  98 F (36.7 C)  TempSrc:  Oral  Oral  SpO2: 97%  95% 96%  Weight:      Height:       Body mass index is 41.78 kg/m.  Physical Exam Constitutional:      Comments: Resting in recliner  Eyes:     General: No scleral icterus.    Pupils: Pupils are equal, round, and reactive to light.  Neck:     Musculoskeletal: Normal range of motion.  Cardiovascular:     Rate and Rhythm: Normal rate and regular rhythm.     Heart sounds: No murmur.  Pulmonary:     Effort: Pulmonary effort is normal. No respiratory distress.     Breath sounds: Normal breath sounds.  Musculoskeletal:     Comments: RLE swelling improved. Drain removed. 5/5 strength.   Skin:    General: Skin is warm and dry.     Capillary Refill: Capillary refill takes less than 2 seconds.     Comments: Rash appears to be faded  Neurological:     General: No focal deficit present.     Mental Status: He is oriented to person, place, and time.     Lab Results Lab Results  Component Value Date   WBC 11.5 (H) 07/13/2019   HGB 10.4 (L) 07/13/2019   HCT 33.4 (L) 07/13/2019   MCV 97.4  07/13/2019   PLT 372 07/13/2019    Lab Results  Component Value Date   CREATININE 0.53 (L) 07/13/2019   BUN <5 (L) 07/13/2019   NA 131 (L) 07/13/2019   K 4.1 07/13/2019   CL 92 (L) 07/13/2019   CO2 29 07/13/2019    Lab Results  Component Value Date   ALT 19 07/06/2019   AST 25 07/06/2019   ALKPHOS 84 07/06/2019  BILITOT 1.0 07/06/2019     Microbiology: Recent Results (from the past 240 hour(s))  Blood culture (routine x 2)     Status: Abnormal   Collection Time: 07/06/19  4:30 AM   Specimen: BLOOD  Result Value Ref Range Status   Specimen Description BLOOD SITE NOT SPECIFIED  Final   Special Requests   Final    BOTTLES DRAWN AEROBIC AND ANAEROBIC Blood Culture results may not be optimal due to an inadequate volume of blood received in culture bottles   Culture  Setup Time   Final    IN BOTH AEROBIC AND ANAEROBIC BOTTLES GRAM POSITIVE COCCI Organism ID to follow CRITICAL RESULT CALLED TO, READ BACK BY AND VERIFIED WITHDion Body [facility] 07/06/19 2058 JDW Performed at [facility], [address], [place], [place] [address]    Culture STAPHYLOCOCCUS AUREUS (A)  Final   Report Status 07/08/2019 FINAL  Final   Organism ID, Bacteria STAPHYLOCOCCUS AUREUS  Final      Susceptibility   Staphylococcus aureus - MIC*    CIPROFLOXACIN <=0.5 SENSITIVE Sensitive     ERYTHROMYCIN >=8 RESISTANT Resistant     GENTAMICIN <=0.5 SENSITIVE Sensitive     OXACILLIN <=0.25 SENSITIVE Sensitive     TETRACYCLINE >=16 RESISTANT Resistant     VANCOMYCIN 1 SENSITIVE Sensitive     TRIMETH/SULFA <=10 SENSITIVE Sensitive     CLINDAMYCIN RESISTANT Resistant     RIFAMPIN <=0.5 SENSITIVE Sensitive     Inducible Clindamycin POSITIVE Resistant     * STAPHYLOCOCCUS AUREUS  Blood culture (routine x 2)     Status: Abnormal   Collection Time: 07/06/19  4:30 AM   Specimen: BLOOD  Result Value Ref Range Status   Specimen Description BLOOD SITE NOT SPECIFIED  Final   Special Requests   Final     BOTTLES DRAWN AEROBIC AND ANAEROBIC Blood Culture adequate volume   Culture  Setup Time   Final    IN BOTH AEROBIC AND ANAEROBIC BOTTLES GRAM POSITIVE COCCI CRITICAL VALUE NOTED.  VALUE IS CONSISTENT WITH PREVIOUSLY REPORTED AND CALLED VALUE.    Culture (A)  Final    STAPHYLOCOCCUS AUREUS SUSCEPTIBILITIES PERFORMED ON PREVIOUS CULTURE WITHIN THE LAST 5 DAYS. Performed at [facility], [place] [address] [place],  [address]    Report Status 07/08/2019 FINAL  Final  Blood Culture ID Panel (Reflexed)     Status: Abnormal   Collection Time: 07/06/19  4:30 AM  Result Value Ref Range Status   Enterococcus species NOT DETECTED NOT DETECTED Final   Listeria monocytogenes NOT DETECTED NOT DETECTED Final   Staphylococcus species DETECTED (A) NOT DETECTED Final    Comment: CRITICAL RESULT CALLED TO, READ BACK BY AND VERIFIED WITH: J MILLEN PHARMD 07/06/19 2058 JDW    Staphylococcus aureus (BCID) DETECTED (A) NOT DETECTED Final    Comment: Methicillin (oxacillin) susceptible Staphylococcus aureus (MSSA). Preferred therapy is anti staphylococcal beta lactam antibiotic (Cefazolin or Nafcillin), unless clinically contraindicated. CRITICAL RESULT CALLED TO, READ BACK BY AND VERIFIED WITH: J MILLEN PHARMD 07/06/19 2058 JDW    Methicillin resistance NOT DETECTED NOT DETECTED Final   Streptococcus species NOT DETECTED NOT DETECTED Final   Streptococcus agalactiae NOT DETECTED NOT DETECTED Final   Streptococcus pneumoniae NOT DETECTED NOT DETECTED Final   Streptococcus pyogenes NOT DETECTED NOT DETECTED Final   Acinetobacter baumannii NOT DETECTED NOT DETECTED Final   Enterobacteriaceae species NOT DETECTED NOT DETECTED Final   Enterobacter cloacae complex NOT DETECTED NOT DETECTED  Final   Escherichia coli NOT DETECTED NOT DETECTED Final   Klebsiella oxytoca NOT DETECTED NOT DETECTED Final   Klebsiella pneumoniae NOT DETECTED NOT DETECTED Final   Proteus species NOT DETECTED NOT DETECTED Final    Serratia marcescens NOT DETECTED NOT DETECTED Final   Haemophilus influenzae NOT DETECTED NOT DETECTED Final   Neisseria meningitidis NOT DETECTED NOT DETECTED Final   Pseudomonas aeruginosa NOT DETECTED NOT DETECTED Final   Candida albicans NOT DETECTED NOT DETECTED Final   Candida glabrata NOT DETECTED NOT DETECTED Final   Candida krusei NOT DETECTED NOT DETECTED Final   Candida parapsilosis NOT DETECTED NOT DETECTED Final   Candida tropicalis NOT DETECTED NOT DETECTED Final    Comment: Performed at [facility], [place] [address], [place], [place] [address]  Urine culture     Status: Abnormal   Collection Time: 07/06/19  4:37 AM   Specimen: Urine, Random  Result Value Ref Range Status   Specimen Description URINE, RANDOM  Final   Special Requests   Final    NONE Performed at [facility], [place] [address], [place], [place] [address]    Culture (A)  Final    >=100,000 COLONIES/mL MULTIPLE SPECIES PRESENT, SUGGEST RECOLLECTION   Report Status 07/07/2019 FINAL  Final  SARS Coronavirus 2 [facility] order, Performed in [facility] hospital lab) Nasopharyngeal Nasopharyngeal Swab     Status: None   Collection Time: 07/06/19  8:04 AM   Specimen: Nasopharyngeal Swab  Result Value Ref Range Status   SARS Coronavirus 2 NEGATIVE NEGATIVE Final    Comment: (NOTE) If result is NEGATIVE SARS-CoV-2 target nucleic acids are NOT DETECTED. The SARS-CoV-2 RNA is generally detectable in upper and lower  respiratory specimens during the acute phase of infection. The lowest  concentration of SARS-CoV-2 viral copies this assay can detect is 250  copies / mL. A negative result does not preclude SARS-CoV-2 infection  and should not be used as the sole basis for treatment or other  patient management decisions.  A negative result may occur with  improper specimen collection / handling, submission of specimen other  than nasopharyngeal swab, presence of viral mutation(s) within the  areas  targeted by this assay, and inadequate number of viral copies  (<250 copies / mL). A negative result must be combined with clinical  observations, patient history, and epidemiological information. If result is POSITIVE SARS-CoV-2 target nucleic acids are DETECTED. The SARS-CoV-2 RNA is generally detectable in upper and lower  respiratory specimens dur ing the acute phase of infection.  Positive  results are indicative of active infection with SARS-CoV-2.  Clinical  correlation with patient history and other diagnostic information is  necessary to determine patient infection status.  Positive results do  not rule out bacterial infection or co-infection with other viruses. If result is PRESUMPTIVE POSTIVE SARS-CoV-2 nucleic acids MAY BE PRESENT.   A presumptive positive result was obtained on the submitted specimen  and confirmed on repeat testing.  While 2019 novel coronavirus  (SARS-CoV-2) nucleic acids may be present in the submitted sample  additional confirmatory testing may be necessary for epidemiological  and / or clinical management purposes  to differentiate between  SARS-CoV-2 and other Sarbecovirus currently known to infect humans.  If clinically indicated additional testing with an alternate test  methodology [phone]) is advised. The SARS-CoV-2 RNA is generally  detectable in upper and lower respiratory sp ecimens during the acute  phase of infection. The expected result is Negative. Fact  Sheet for Patients:  StrictlyIdeas.no Fact Sheet for Healthcare Providers: BankingDealers.co.za This test is not yet approved or cleared by the [place] FDA and has been authorized for detection and/or diagnosis of SARS-CoV-2 by FDA under an Emergency Use Authorization (EUA).  This EUA will remain in effect (meaning this test can be used) for the duration of the COVID-19 declaration under Section 564(b)(1) of the Act, 21 U.S.C. section  360bbb-3(b)(1), unless the authorization is terminated or revoked sooner. Performed at [facility], [place] [address], [place], [place] [address]   Culture, fungus without smear     Status: None (Preliminary result)   Collection Time: 07/06/19  6:48 PM   Specimen: Abscess; Other  Result Value Ref Range Status   Specimen Description ABSCESS PARASPINAL  Final   Special Requests NONE  Final   Culture   Final    NO FUNGUS ISOLATED AFTER 3 DAYS Performed at [facility], [address], [place], [place] [address]    Report Status PENDING  Incomplete  Aerobic/Anaerobic Culture (surgical/deep wound)     Status: None   Collection Time: 07/06/19  6:48 PM   Specimen: Abscess  Result Value Ref Range Status   Specimen Description ABSCESS  Final   Special Requests PARASPINAL  Final   Gram Stain   Final    FEW WBC PRESENT, PREDOMINANTLY PMN ABUNDANT GRAM POSITIVE COCCI    Culture   Final    ABUNDANT STAPHYLOCOCCUS AUREUS NO ANAEROBES ISOLATED Performed at [facility], [place] [address] [place], [place] [address]    Report Status 07/12/2019 FINAL  Final   Organism ID, Bacteria STAPHYLOCOCCUS AUREUS  Final      Susceptibility   Staphylococcus aureus - MIC*    CIPROFLOXACIN <=0.5 SENSITIVE Sensitive     ERYTHROMYCIN >=8 RESISTANT Resistant     GENTAMICIN <=0.5 SENSITIVE Sensitive     OXACILLIN <=0.25 SENSITIVE Sensitive     TETRACYCLINE >=16 RESISTANT Resistant     VANCOMYCIN 1 SENSITIVE Sensitive     TRIMETH/SULFA <=10 SENSITIVE Sensitive     CLINDAMYCIN RESISTANT Resistant     RIFAMPIN <=0.5 SENSITIVE Sensitive     Inducible Clindamycin POSITIVE Resistant     * ABUNDANT STAPHYLOCOCCUS AUREUS  Aerobic/Anaerobic Culture (surgical/deep wound)     Status: None (Preliminary result)   Collection Time: 07/09/19  8:32 AM   Specimen: Abscess  Result Value Ref Range Status   Specimen Description ABSCESS HIP RIGHT  Final   Special Requests NONE  Final   Gram Stain   Final     MODERATE WBC PRESENT,BOTH PMN AND MONONUCLEAR ABUNDANT GRAM POSITIVE COCCI IN PAIRS IN CLUSTERS Performed at [facility], [address] [place], [place] [address]    Culture   Final    FEW STAPHYLOCOCCUS AUREUS NO ANAEROBES ISOLATED; CULTURE IN PROGRESS FOR 5 DAYS    Report Status PENDING  Incomplete   Organism ID, Bacteria STAPHYLOCOCCUS AUREUS  Final      Susceptibility   Staphylococcus aureus - MIC*    CIPROFLOXACIN <=0.5 SENSITIVE Sensitive     ERYTHROMYCIN >=8 RESISTANT Resistant     GENTAMICIN <=0.5 SENSITIVE Sensitive     OXACILLIN <=0.25 SENSITIVE Sensitive     TETRACYCLINE >=16 RESISTANT Resistant     VANCOMYCIN 1 SENSITIVE Sensitive     TRIMETH/SULFA <=10 SENSITIVE Sensitive     CLINDAMYCIN RESISTANT Resistant     RIFAMPIN <=0.5 SENSITIVE Sensitive     Inducible Clindamycin POSITIVE Resistant     *  FEW STAPHYLOCOCCUS AUREUS  Culture, blood (routine x 2)     Status: None (Preliminary result)   Collection Time: 07/09/19 12:57 PM   Specimen: BLOOD RIGHT HAND  Result Value Ref Range Status   Specimen Description BLOOD RIGHT HAND  Final   Special Requests   Final    AEROBIC BOTTLE ONLY Blood Culture results may not be optimal due to an inadequate volume of blood received in culture bottles   Culture   Final    NO GROWTH 4 DAYS Performed at [facility], [place] [address] [place], [place] [address]    Report Status PENDING  Incomplete  Culture, blood (routine x 2)     Status: None (Preliminary result)   Collection Time: 07/09/19  1:06 PM   Specimen: BLOOD RIGHT HAND  Result Value Ref Range Status   Specimen Description BLOOD RIGHT HAND  Final   Special Requests AEROBIC BOTTLE ONLY Blood Culture adequate volume  Final   Culture   Final    NO GROWTH 4 DAYS Performed at [facility], [place] [address] [place], [place] [address]    Report Status PENDING  Incomplete     Janene Madeira, MSN, NP-C [facility] [email or web address] Pager: [phone] Office: [phone] [place]: [phone]

## 2019-07-13 NOTE — Progress Notes (Signed)
PROGRESS NOTE  Bradley Cox [id number] DOB: 1971-03-17   PCP: Simona Huh, NP  Patient is from: Home  DOA: 07/05/2019 LOS: 7  Brief Narrative / Interim history: 48 year old male with history of obesity, asthma, hypertension, GERD, nephrolithiasis, diabetes mellitus who presented with low back pain radiating to the right gluteus/thigh ICU with progressive decline in the right lower extremity strength, difficulty ambulation over 2 to 3 weeks.  On presentation he was found to be febrile, tachycardic, tachypneic.  Blood glucose was elevated with increased anion gap.  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  Patient was started on broad-spectrum antibiotics, insulin for DKA.  Neurosurgery, ID consulted.  Patient underwent IR guided his paraspinal abscess drainage.  Blood cultures now growing MSSA.  Currently on cefazolin.  Patient had episode of nausea and vomiting the night of 9/9.  Vomiting was nonbilious.  KUB obtained and revealed marked gaseous dilation of stomach and nonobstructive gas pattern and small and large bowel concerning for ileus. NG tube was placed for decompression.  However, NG tube came out the next morning.  Patient has not had further emesis.  Passing gas but has not has bowel movement.  Subjective: Reports having a better sleep last night.  However, reports feeling weak and tired this morning.  Still with pain in his right lower extremity and around his waist.  Denies further emesis.  Is passing gas but no bowel movement yet.  Denies chest pain, dyspnea, abdominal pain or urinary symptoms.  Objective: Vitals:   07/13/19 0850 07/13/19 0900 07/13/19 1000 07/13/19 1153  BP: 133/90     Pulse: 88 90 97   Resp: 19 20 20    Temp: 98.3 F (36.8 C)   98.9 F (37.2 C)  TempSrc: Oral   Oral  SpO2: 98% 99% 95%   Weight:      Height:        Intake/Output Summary (Last 24  hours) at 07/13/2019 1235 Last data filed at 07/13/2019 1000 Gross per 24 hour  Intake 2064.19 ml  Output 3975 ml  Net -1910.81 ml   Filed Weights   07/05/19 1807 07/06/19 2116  Weight: (!) 167.4 kg (!) 164 kg    Examination:  GENERAL: No acute distress.  Appears well.  HEENT: MMM.  Vision and hearing grossly intact.  NECK: Supple.  No apparent JVD.  RESP:  No IWOB. Good air movement bilaterally. CVS:  RRR. Heart sounds normal.  ABD/GI/GU: Bowel sounds present. Soft. Non tender.  MSK/EXT:  Moves extremities.  Erythema behind his right knee.  Tender to palpation.  Notable swelling of RLE.  Somewhat purulent material from right hip drain. SKIN: Erythema over right posterior calf proximally NEURO: Sleepy but arises easily and responds to question appropriately.  No gross deficit.  PSYCH: Calm. Normal affect.   Assessment & Plan: Sepsis/MSSA bacteremia/L3-S1 epidural abscess/right iliopsoas and gluteal abscess/right lower extremity cellulitis -Lower extremity Doppler negative for DVT. -Currently on cefazolin and Flagyl per ID.  Plan is 6 to 8 weeks of IV Ancef.  IV Flagyl to be discontinued once drain come out. -Blood cultures showing MSSA.   -TTE and TEE did not show any vegetation.   -Appreciate Ortho trauma input-does not feel repeat I&D is warranted. -Continue pain management with bowel regimen.. -PT/OT evaluation -CIR  Ileus/constipation -Status post NG tube decompression of gastric gas.  -No further emesis.  Passing gases. -Advance to full liquid diet.  If he tolerates, will advance to soft. -Scheduled MiraLAX, Senokot and Reglan.  If no improvement, may consider suppository with enema. -Mobilize as able which has been difficult.  Uncontrolled NIDDM-2/DKA: DKA resolved.  Off insulin drip. Hemoglobin A1c of 12.6.  CBG elevated but improved.  -Continue NPH to 23 units twice daily -Continue mealtime at 8 units. -Continue SSI-moderate -Diabetic coordinator  Hyponatremia  with hypochloremia: Improved. -Continue normal saline to 125 cc an hour -We will obtain urine chemistry  AKI: Resolved with IV hydration  Hypokalemia: Resolved.  Hypertension:  Normotensive. -Continue hydralazine scheduled and as needed.  GERD: Continue PPI  Fatty liver:   Could be associated  with history of  diabetes, obesity  Morbid obesity: BMI 42.6  DVT prophylaxis: Subcu Lovenox Code Status: Full code Family Communication: Per patient and RN. Disposition Plan: Remains inpatient pending clinical improvement and clearance by consultants.  Final disposition CIR? Consultants: Ortho trauma, infectious disease  Procedures:  9/6-incision and drainage 9/9-TEE  Microbiology summarized: 9/4-SARS-CoV-2 screen negative 9/4-blood culture-MSSA 9/7-tissue culture MSSA  Antimicrobials: Anti-infectives (From admission, onward)   Start     Dose/Rate Route Frequency Ordered Stop   07/10/19 1600  metroNIDAZOLE (FLAGYL) IVPB 500 mg  Status:  Discontinued     500 mg 100 mL/hr over 60 Minutes Intravenous Every 8 hours 07/10/19 1549 07/13/19 0939   07/09/19 0839  vancomycin (VANCOCIN) powder  Status:  Discontinued       As needed 07/09/19 0839 07/09/19 0935   07/09/19 0839  tobramycin (NEBCIN) powder  Status:  Discontinued       As needed 07/09/19 0840 07/09/19 0935   07/07/19 1830  ceFAZolin (ANCEF) IVPB 2g/100 mL premix     2 g 200 mL/hr over 30 Minutes Intravenous Every 8 hours 07/07/19 1333     07/07/19 0900  ceFAZolin (ANCEF) IVPB 2g/100 mL premix  Status:  Discontinued     2 g 200 mL/hr over 30 Minutes Intravenous Every 8 hours 07/07/19 0847 07/07/19 1333   07/06/19 2030  clindamycin (CLEOCIN) IVPB 600 mg  Status:  Discontinued     600 mg 100 mL/hr over 30 Minutes Intravenous Every 8 hours 07/06/19 2010 07/07/19 0847   07/06/19 1800  vancomycin (VANCOCIN) 1,500 mg in sodium chloride 0.9 % 500 mL IVPB  Status:  Discontinued     1,500 mg 250 mL/hr over 120 Minutes  Intravenous Every 12 hours 07/06/19 0820 07/07/19 0847   07/06/19 1400  ceFEPIme (MAXIPIME) 2 g in sodium chloride 0.9 % 100 mL IVPB  Status:  Discontinued     2 g 200 mL/hr over 30 Minutes Intravenous Every 8 hours 07/06/19 0504 07/07/19 0847   07/06/19 1400  vancomycin (VANCOCIN) 2,000 mg in sodium chloride 0.9 % 500 mL IVPB  Status:  Discontinued     2,000 mg 250 mL/hr over 120 Minutes Intravenous Every 8 hours 07/06/19 0504 07/06/19 0818   07/06/19 0745  clindamycin (CLEOCIN) IVPB 600 mg  Status:  Discontinued     600 mg 100 mL/hr over 30 Minutes Intravenous  Once 07/06/19 0737 07/06/19 2029   07/06/19 0500  ceFEPIme (MAXIPIME) 2 g in sodium chloride 0.9 % 100 mL IVPB     2 g 200 mL/hr over 30 Minutes Intravenous  Once 07/06/19 0451 07/06/19 1335   07/06/19 0500  vancomycin (VANCOCIN) 2,500 mg in sodium chloride 0.9 % 500 mL IVPB     2,500 mg 250 mL/hr over 120 Minutes Intravenous  Once 07/06/19 0451 07/06/19 1335  Sch Meds:  Scheduled Meds:  acetaminophen  650 mg Oral Q6H   Or   acetaminophen  650 mg Rectal Q6H   enoxaparin (LOVENOX) injection  0.5 mg/kg Subcutaneous Q24H   gabapentin  300 mg Oral TID   hydrALAZINE  25 mg Oral BID   insulin aspart  0-15 Units Subcutaneous TID WC   insulin aspart  0-5 Units Subcutaneous QHS   insulin aspart  8 Units Subcutaneous TID WC   insulin NPH Human  20 Units Subcutaneous BID AC & HS   metoCLOPramide  5 mg Oral TID AC   polyethylene glycol  17 g Oral BID   senna-docusate  1 tablet Oral BID   sodium chloride flush  3 mL Intravenous Q12H   Continuous Infusions:  sodium chloride Stopped (07/13/19 0904)    ceFAZolin (ANCEF) IV 2 g (07/13/19 1148)   PRN Meds:.albuterol, fentaNYL (SUBLIMAZE) injection, hydrALAZINE, methocarbamol, ondansetron **OR** ondansetron (ZOFRAN) IV, oxyCODONE, zolpidem   I have personally reviewed the following labs and images: CBC: Recent Labs  Lab 07/07/19 0521 07/08/19 1019  07/09/19 0248 07/10/19 0521 07/12/19 0314  WBC 6.7 6.3 5.9 8.6 10.0  HGB 11.1* 10.7* 10.4* 10.2* 10.1*  HCT 34.2* 31.6* 31.0* 31.0* 31.1*  MCV 92.4 90.5 89.6 90.9 91.7  PLT 197 172 168 216 334   BMP &GFR Recent Labs  Lab 07/09/19 0248 07/10/19 0521 07/11/19 0345 07/12/19 0314 07/13/19 0830  NA 124* 127* 128* 131* 131*  K 3.8 3.9 3.5 3.4* 4.1  CL 86* 88* 87* 89* 92*  CO2 28 27 32 28 29  GLUCOSE 276* 312* 247* 187* 141*  BUN 6 5* 5* <5* <5*  CREATININE 0.55* 0.69 0.63 0.59* 0.53*  CALCIUM 8.0* 7.9* 8.3* 8.1* 8.1*  MG  --   --   --  1.9 1.9   Estimated Creatinine Clearance: 188.1 mL/min (A) (by C-G formula based on SCr of 0.53 mg/dL (L)). Liver & Pancreas: No results for input(s): AST, ALT, ALKPHOS, BILITOT, PROT, ALBUMIN in the last 168 hours. No results for input(s): LIPASE, AMYLASE in the last 168 hours. No results for input(s): AMMONIA in the last 168 hours. Diabetic: No results for input(s): HGBA1C in the last 72 hours. Recent Labs  Lab 07/12/19 1223 07/12/19 1629 07/12/19 2114 07/13/19 0853 07/13/19 1147  GLUCAP 174* 151* 137* 140* 210*   Cardiac Enzymes: No results for input(s): CKTOTAL, CKMB, CKMBINDEX, TROPONINI in the last 168 hours. No results for input(s): PROBNP in the last 8760 hours. Coagulation Profile: No results for input(s): INR, PROTIME in the last 168 hours. Thyroid Function Tests: No results for input(s): TSH, T4TOTAL, FREET4, T3FREE, THYROIDAB in the last 72 hours. Lipid Profile: No results for input(s): CHOL, HDL, LDLCALC, TRIG, CHOLHDL, LDLDIRECT in the last 72 hours. Anemia Panel: No results for input(s): VITAMINB12, FOLATE, FERRITIN, TIBC, IRON, RETICCTPCT in the last 72 hours. Urine analysis:    Component Value Date/Time   COLORURINE YELLOW 07/06/2019 [place] 07/06/2019 0437   LABSPEC 1.024 07/06/2019 0437   PHURINE 5.0 07/06/2019 0437   GLUCOSEU >=500 (A) 07/06/2019 0437   HGBUR MODERATE (A) 07/06/2019 0437    BILIRUBINUR NEGATIVE 07/06/2019 0437   KETONESUR 80 (A) 07/06/2019 0437   PROTEINUR NEGATIVE 07/06/2019 0437   NITRITE NEGATIVE 07/06/2019 0437   LEUKOCYTESUR NEGATIVE 07/06/2019 0437   Sepsis Labs: Invalid input(s): PROCALCITONIN, [place]  Microbiology: Recent Results (from the past 240 hour(s))  Blood culture (routine x 2)     Status: Abnormal  Collection Time: 07/06/19  4:30 AM   Specimen: BLOOD  Result Value Ref Range Status   Specimen Description BLOOD SITE NOT SPECIFIED  Final   Special Requests   Final    BOTTLES DRAWN AEROBIC AND ANAEROBIC Blood Culture results may not be optimal due to an inadequate volume of blood received in culture bottles   Culture  Setup Time   Final    IN BOTH AEROBIC AND ANAEROBIC BOTTLES GRAM POSITIVE COCCI Organism ID to follow CRITICAL RESULT CALLED TO, READ BACK BY AND VERIFIED WITHDion Body [facility] 07/06/19 2058 JDW Performed at [facility], [address] [place], [place] [address]    Culture STAPHYLOCOCCUS AUREUS (A)  Final   Report Status 07/08/2019 FINAL  Final   Organism ID, Bacteria STAPHYLOCOCCUS AUREUS  Final      Susceptibility   Staphylococcus aureus - MIC*    CIPROFLOXACIN <=0.5 SENSITIVE Sensitive     ERYTHROMYCIN >=8 RESISTANT Resistant     GENTAMICIN <=0.5 SENSITIVE Sensitive     OXACILLIN <=0.25 SENSITIVE Sensitive     TETRACYCLINE >=16 RESISTANT Resistant     VANCOMYCIN 1 SENSITIVE Sensitive     TRIMETH/SULFA <=10 SENSITIVE Sensitive     CLINDAMYCIN RESISTANT Resistant     RIFAMPIN <=0.5 SENSITIVE Sensitive     Inducible Clindamycin POSITIVE Resistant     * STAPHYLOCOCCUS AUREUS  Blood culture (routine x 2)     Status: Abnormal   Collection Time: 07/06/19  4:30 AM   Specimen: BLOOD  Result Value Ref Range Status   Specimen Description BLOOD SITE NOT SPECIFIED  Final   Special Requests   Final    BOTTLES DRAWN AEROBIC AND ANAEROBIC Blood Culture adequate volume   Culture  Setup Time   Final    IN BOTH  AEROBIC AND ANAEROBIC BOTTLES GRAM POSITIVE COCCI CRITICAL VALUE NOTED.  VALUE IS CONSISTENT WITH PREVIOUSLY REPORTED AND CALLED VALUE.    Culture (A)  Final    STAPHYLOCOCCUS AUREUS SUSCEPTIBILITIES PERFORMED ON PREVIOUS CULTURE WITHIN THE LAST 5 DAYS. Performed at [facility], [place] [address] [place], [place] [address]    Report Status 07/08/2019 FINAL  Final  Blood Culture ID Panel (Reflexed)     Status: Abnormal   Collection Time: 07/06/19  4:30 AM  Result Value Ref Range Status   Enterococcus species NOT DETECTED NOT DETECTED Final   Listeria monocytogenes NOT DETECTED NOT DETECTED Final   Staphylococcus species DETECTED (A) NOT DETECTED Final    Comment: CRITICAL RESULT CALLED TO, READ BACK BY AND VERIFIED WITH: J MILLEN PHARMD 07/06/19 2058 JDW    Staphylococcus aureus (BCID) DETECTED (A) NOT DETECTED Final    Comment: Methicillin (oxacillin) susceptible Staphylococcus aureus (MSSA). Preferred therapy is anti staphylococcal beta lactam antibiotic (Cefazolin or Nafcillin), unless clinically contraindicated. CRITICAL RESULT CALLED TO, READ BACK BY AND VERIFIED WITH: Dion Body [facility] 07/06/19 2058 JDW    Methicillin resistance NOT DETECTED NOT DETECTED Final   Streptococcus species NOT DETECTED NOT DETECTED Final   Streptococcus agalactiae NOT DETECTED NOT DETECTED Final   Streptococcus pneumoniae NOT DETECTED NOT DETECTED Final   Streptococcus pyogenes NOT DETECTED NOT DETECTED Final   Acinetobacter baumannii NOT DETECTED NOT DETECTED Final   Enterobacteriaceae species NOT DETECTED NOT DETECTED Final   Enterobacter cloacae complex NOT DETECTED NOT DETECTED Final   Escherichia coli NOT DETECTED NOT DETECTED Final   Klebsiella oxytoca NOT DETECTED NOT DETECTED Final   Klebsiella pneumoniae NOT DETECTED NOT DETECTED Final  Proteus species NOT DETECTED NOT DETECTED Final   Serratia marcescens NOT DETECTED NOT DETECTED Final   Haemophilus influenzae NOT DETECTED NOT DETECTED  Final   Neisseria meningitidis NOT DETECTED NOT DETECTED Final   Pseudomonas aeruginosa NOT DETECTED NOT DETECTED Final   Candida albicans NOT DETECTED NOT DETECTED Final   Candida glabrata NOT DETECTED NOT DETECTED Final   Candida krusei NOT DETECTED NOT DETECTED Final   Candida parapsilosis NOT DETECTED NOT DETECTED Final   Candida tropicalis NOT DETECTED NOT DETECTED Final    Comment: Performed at [facility], [place] [address] [place], [place] [address]  Urine culture     Status: Abnormal   Collection Time: 07/06/19  4:37 AM   Specimen: Urine, Random  Result Value Ref Range Status   Specimen Description URINE, RANDOM  Final   Special Requests   Final    NONE Performed at [facility], [place] [address], [place], [place] [address]    Culture (A)  Final    >=100,000 COLONIES/mL MULTIPLE SPECIES PRESENT, SUGGEST RECOLLECTION   Report Status 07/07/2019 FINAL  Final  SARS Coronavirus 2 [facility] order, Performed in [facility] hospital lab) Nasopharyngeal Nasopharyngeal Swab     Status: None   Collection Time: 07/06/19  8:04 AM   Specimen: Nasopharyngeal Swab  Result Value Ref Range Status   SARS Coronavirus 2 NEGATIVE NEGATIVE Final    Comment: (NOTE) If result is NEGATIVE SARS-CoV-2 target nucleic acids are NOT DETECTED. The SARS-CoV-2 RNA is generally detectable in upper and lower  respiratory specimens during the acute phase of infection. The lowest  concentration of SARS-CoV-2 viral copies this assay can detect is 250  copies / mL. A negative result does not preclude SARS-CoV-2 infection  and should not be used as the sole basis for treatment or other  patient management decisions.  A negative result may occur with  improper specimen collection / handling, submission of specimen other  than nasopharyngeal swab, presence of viral mutation(s) within the  areas targeted by this assay, and inadequate number of viral copies  (<250 copies / mL). A negative result must be  combined with clinical  observations, patient history, and epidemiological information. If result is POSITIVE SARS-CoV-2 target nucleic acids are DETECTED. The SARS-CoV-2 RNA is generally detectable in upper and lower  respiratory specimens dur ing the acute phase of infection.  Positive  results are indicative of active infection with SARS-CoV-2.  Clinical  correlation with patient history and other diagnostic information is  necessary to determine patient infection status.  Positive results do  not rule out bacterial infection or co-infection with other viruses. If result is PRESUMPTIVE POSTIVE SARS-CoV-2 nucleic acids MAY BE PRESENT.   A presumptive positive result was obtained on the submitted specimen  and confirmed on repeat testing.  While 2019 novel coronavirus  (SARS-CoV-2) nucleic acids may be present in the submitted sample  additional confirmatory testing may be necessary for epidemiological  and / or clinical management purposes  to differentiate between  SARS-CoV-2 and other Sarbecovirus currently known to infect humans.  If clinically indicated additional testing with an alternate test  methodology [phone]) is advised. The SARS-CoV-2 RNA is generally  detectable in upper and lower respiratory sp ecimens during the acute  phase of infection. The expected result is Negative. Fact Sheet for Patients:  StrictlyIdeas.no Fact Sheet for Healthcare Providers: BankingDealers.co.za This test is not yet approved or cleared by the [place] FDA and has been authorized for detection  and/or diagnosis of SARS-CoV-2 by FDA under an Emergency Use Authorization (EUA).  This EUA will remain in effect (meaning this test can be used) for the duration of the COVID-19 declaration under Section 564(b)(1) of the Act, 21 U.S.C. section 360bbb-3(b)(1), unless the authorization is terminated or revoked sooner. Performed at [facility], [place] [address] [place], [place] [address]   Culture, fungus without smear     Status: None (Preliminary result)   Collection Time: 07/06/19  6:48 PM   Specimen: Abscess; Other  Result Value Ref Range Status   Specimen Description ABSCESS PARASPINAL  Final   Special Requests NONE  Final   Culture   Final    NO FUNGUS ISOLATED AFTER 3 DAYS Performed at [facility], [address], [place], [place] [address]    Report Status PENDING  Incomplete  Aerobic/Anaerobic Culture (surgical/deep wound)     Status: None   Collection Time: 07/06/19  6:48 PM   Specimen: Abscess  Result Value Ref Range Status   Specimen Description ABSCESS  Final   Special Requests PARASPINAL  Final   Gram Stain   Final    FEW WBC PRESENT, PREDOMINANTLY PMN ABUNDANT GRAM POSITIVE COCCI    Culture   Final    ABUNDANT STAPHYLOCOCCUS AUREUS NO ANAEROBES ISOLATED Performed at [facility], [place] [address], [place], [place] [address]    Report Status 07/12/2019 FINAL  Final   Organism ID, Bacteria STAPHYLOCOCCUS AUREUS  Final      Susceptibility   Staphylococcus aureus - MIC*    CIPROFLOXACIN <=0.5 SENSITIVE Sensitive     ERYTHROMYCIN >=8 RESISTANT Resistant     GENTAMICIN <=0.5 SENSITIVE Sensitive     OXACILLIN <=0.25 SENSITIVE Sensitive     TETRACYCLINE >=16 RESISTANT Resistant     VANCOMYCIN 1 SENSITIVE Sensitive     TRIMETH/SULFA <=10 SENSITIVE Sensitive     CLINDAMYCIN RESISTANT Resistant     RIFAMPIN <=0.5 SENSITIVE Sensitive     Inducible Clindamycin POSITIVE Resistant     * ABUNDANT STAPHYLOCOCCUS AUREUS  Aerobic/Anaerobic Culture (surgical/deep wound)     Status: None (Preliminary result)   Collection Time: 07/09/19  8:32 AM   Specimen: Abscess  Result Value Ref Range Status   Specimen Description ABSCESS HIP RIGHT  Final   Special Requests NONE  Final   Gram Stain   Final    MODERATE WBC PRESENT,BOTH PMN AND MONONUCLEAR ABUNDANT GRAM POSITIVE COCCI IN PAIRS IN CLUSTERS Performed  at [facility], [address], [place], [place] [address]    Culture   Final    FEW STAPHYLOCOCCUS AUREUS NO ANAEROBES ISOLATED; CULTURE IN PROGRESS FOR 5 DAYS    Report Status PENDING  Incomplete   Organism ID, Bacteria STAPHYLOCOCCUS AUREUS  Final      Susceptibility   Staphylococcus aureus - MIC*    CIPROFLOXACIN <=0.5 SENSITIVE Sensitive     ERYTHROMYCIN >=8 RESISTANT Resistant     GENTAMICIN <=0.5 SENSITIVE Sensitive     OXACILLIN <=0.25 SENSITIVE Sensitive     TETRACYCLINE >=16 RESISTANT Resistant     VANCOMYCIN 1 SENSITIVE Sensitive     TRIMETH/SULFA <=10 SENSITIVE Sensitive     CLINDAMYCIN RESISTANT Resistant     RIFAMPIN <=0.5 SENSITIVE Sensitive     Inducible Clindamycin POSITIVE Resistant     * FEW STAPHYLOCOCCUS AUREUS  Culture, blood (routine x 2)     Status: None (Preliminary result)   Collection Time: 07/09/19 12:57 PM  Specimen: BLOOD RIGHT HAND  Result Value Ref Range Status   Specimen Description BLOOD RIGHT HAND  Final   Special Requests   Final    AEROBIC BOTTLE ONLY Blood Culture results may not be optimal due to an inadequate volume of blood received in culture bottles   Culture   Final    NO GROWTH 4 DAYS Performed at [facility], [place] [address], [place], [place] [address]    Report Status PENDING  Incomplete  Culture, blood (routine x 2)     Status: None (Preliminary result)   Collection Time: 07/09/19  1:06 PM   Specimen: BLOOD RIGHT HAND  Result Value Ref Range Status   Specimen Description BLOOD RIGHT HAND  Final   Special Requests AEROBIC BOTTLE ONLY Blood Culture adequate volume  Final   Culture   Final    NO GROWTH 4 DAYS Performed at [facility], [place] [address] [place], [place] [address]    Report Status PENDING  Incomplete    Radiology Studies: No results found.  Sheryl Towell T. Solvang  If 7PM-7AM, please contact night-coverage [email or web address] Password [facility] 07/13/2019, 12:35 PM

## 2019-07-13 NOTE — Progress Notes (Signed)
Ortho Trauma Progress Note  Patient feels somewhat improved however he still is having pain in his hip.  He has limited range of motion of his leg, still having difficulty with lifting leg  Right lower extremity dressing incision is clean, dry, intact. Hemovac removed, some serous drainage noted after removal.  Does not look comparable to the pus that was appreciated in the operating room.  He has active dorsiflexion plantarflexion of his ankle.  Limited movement of his hip and knee secondary to pain and discomfort. 2+ DP pulse  Results for orders placed or performed during the hospital encounter of 07/05/19 (from the past 24 hour(s))  Glucose, capillary     Status: Abnormal   Collection Time: 07/12/19 12:23 PM  Result Value Ref Range   Glucose-Capillary 174 (H) 70 - 99 mg/dL  Glucose, capillary     Status: Abnormal   Collection Time: 07/12/19  4:29 PM  Result Value Ref Range   Glucose-Capillary 151 (H) 70 - 99 mg/dL  Glucose, capillary     Status: Abnormal   Collection Time: 07/12/19  9:14 PM  Result Value Ref Range   Glucose-Capillary 137 (H) 70 - 99 mg/dL   Comment 1 Notify RN    Comment 2 Document in Chart   Glucose, capillary     Status: Abnormal   Collection Time: 07/13/19  8:53 AM  Result Value Ref Range   Glucose-Capillary 140 (H) 70 - 99 mg/dL    Assessment and plan: 48 year old male with multifocal abscesses status post I&D right gluteal abscess with extension into the pelvis.  Patient had a significantly large abscess and will have significant pain for a number of weeks.  The drain does not look overtly purulent compared to what it was intraoperatively.  I do not feel that a repeat I&D is warranted.  Continue with IV antibiotics.  We will continue evaluate. Drain removed today, will continue to monitor area.    Blandina Renaldo A. Carmie [facility] ?([phone]? (phone)

## 2019-07-13 NOTE — Progress Notes (Signed)
Inpatient Rehabilitation Admissions Coordinator  Patient making progress with tolerance with therapies. We will follow up next week with his progress and medical readiness for a possible admit to inpt rehab.  Danne Baxter, RN, MSN Rehab Admissions Coordinator [phone] 07/13/2019 12:48 PM

## 2019-07-14 ENCOUNTER — Inpatient Hospital Stay: Payer: Self-pay

## 2019-07-14 LAB — AEROBIC/ANAEROBIC CULTURE W GRAM STAIN (SURGICAL/DEEP WOUND)

## 2019-07-14 LAB — CULTURE, BLOOD (ROUTINE X 2)
Culture: NO GROWTH
Culture: NO GROWTH
Special Requests: ADEQUATE

## 2019-07-14 LAB — GLUCOSE, CAPILLARY
Glucose-Capillary: 206 mg/dL — ABNORMAL HIGH (ref 70–99)
Glucose-Capillary: 143 mg/dL — ABNORMAL HIGH (ref 70–99)
Glucose-Capillary: 128 mg/dL — ABNORMAL HIGH (ref 70–99)
Glucose-Capillary: 210 mg/dL — ABNORMAL HIGH (ref 70–99)

## 2019-07-14 MED ORDER — SODIUM CHLORIDE 0.9% FLUSH
10.0000 mL | INTRAVENOUS | Status: DC | PRN
  Administered 2019-08-01 – 2019-08-08 (×2): 10 mL
  Filled 2019-07-14 (×2): qty 40

## 2019-07-14 MED ORDER — CHLORHEXIDINE GLUCONATE CLOTH 2 % EX PADS
6.0000 | MEDICATED_PAD | Freq: Every day | CUTANEOUS | Status: DC
  Administered 2019-07-14 – 2019-08-16 (×31): 6 via TOPICAL

## 2019-07-14 MED ORDER — INSULIN ASPART 100 UNIT/ML ~~LOC~~ SOLN
0.0000 [IU] | Freq: Every day | SUBCUTANEOUS | Status: DC
  Administered 2019-07-14: 2 [IU] via SUBCUTANEOUS
  Administered 2019-07-20: 3 [IU] via SUBCUTANEOUS
  Administered 2019-07-21 – 2019-08-08 (×3): 2 [IU] via SUBCUTANEOUS

## 2019-07-14 MED ORDER — SODIUM CHLORIDE 0.9% FLUSH
10.0000 mL | Freq: Two times a day (BID) | INTRAVENOUS | Status: DC
  Administered 2019-07-14 (×2): 20 mL
  Administered 2019-07-15 – 2019-07-18 (×6): 10 mL
  Administered 2019-07-18: 20 mL
  Administered 2019-07-19 – 2019-07-30 (×17): 10 mL
  Administered 2019-07-31: 20 mL
  Administered 2019-07-31 – 2019-08-15 (×16): 10 mL

## 2019-07-14 MED ORDER — INSULIN NPH (HUMAN) (ISOPHANE) 100 UNIT/ML ~~LOC~~ SUSP
23.0000 [IU] | Freq: Two times a day (BID) | SUBCUTANEOUS | Status: DC
  Administered 2019-07-14 – 2019-07-20 (×11): 23 [IU] via SUBCUTANEOUS
  Filled 2019-07-14: qty 10

## 2019-07-14 MED ORDER — INSULIN ASPART 100 UNIT/ML ~~LOC~~ SOLN
0.0000 [IU] | Freq: Three times a day (TID) | SUBCUTANEOUS | Status: DC
  Administered 2019-07-15: 3 [IU] via SUBCUTANEOUS
  Administered 2019-07-15 – 2019-07-16 (×3): 5 [IU] via SUBCUTANEOUS
  Administered 2019-07-16: 13:00:00 3 [IU] via SUBCUTANEOUS
  Administered 2019-07-16: 2 [IU] via SUBCUTANEOUS
  Administered 2019-07-17: 3 [IU] via SUBCUTANEOUS
  Administered 2019-07-17 – 2019-07-18 (×2): 2 [IU] via SUBCUTANEOUS
  Administered 2019-07-18 – 2019-07-19 (×2): 3 [IU] via SUBCUTANEOUS
  Administered 2019-07-19: 2 [IU] via SUBCUTANEOUS
  Administered 2019-07-19: 3 [IU] via SUBCUTANEOUS
  Administered 2019-07-20: 5 [IU] via SUBCUTANEOUS
  Administered 2019-07-20: 17:00:00 11 [IU] via SUBCUTANEOUS
  Administered 2019-07-21: 8 [IU] via SUBCUTANEOUS
  Administered 2019-07-21: 5 [IU] via SUBCUTANEOUS
  Administered 2019-07-21: 8 [IU] via SUBCUTANEOUS
  Administered 2019-07-22: 08:00:00 2 [IU] via SUBCUTANEOUS
  Administered 2019-07-23 – 2019-07-24 (×3): 3 [IU] via SUBCUTANEOUS
  Administered 2019-07-24 – 2019-07-26 (×3): 2 [IU] via SUBCUTANEOUS
  Administered 2019-07-26: 3 [IU] via SUBCUTANEOUS
  Administered 2019-07-26: 2 [IU] via SUBCUTANEOUS
  Administered 2019-07-27: 3 [IU] via SUBCUTANEOUS
  Administered 2019-07-27: 2 [IU] via SUBCUTANEOUS
  Administered 2019-07-28 (×2): 3 [IU] via SUBCUTANEOUS
  Administered 2019-07-28: 5 [IU] via SUBCUTANEOUS
  Administered 2019-07-29 (×2): 2 [IU] via SUBCUTANEOUS
  Administered 2019-07-30: 19:00:00 8 [IU] via SUBCUTANEOUS
  Administered 2019-07-30: 11 [IU] via SUBCUTANEOUS
  Administered 2019-07-31: 5 [IU] via SUBCUTANEOUS
  Administered 2019-07-31 – 2019-08-04 (×6): 2 [IU] via SUBCUTANEOUS
  Administered 2019-08-05 (×2): 3 [IU] via SUBCUTANEOUS
  Administered 2019-08-05: 2 [IU] via SUBCUTANEOUS
  Administered 2019-08-06: 13:00:00 3 [IU] via SUBCUTANEOUS
  Administered 2019-08-07: 2 [IU] via SUBCUTANEOUS
  Administered 2019-08-08: 3 [IU] via SUBCUTANEOUS
  Administered 2019-08-09: 5 [IU] via SUBCUTANEOUS
  Administered 2019-08-09 (×2): 3 [IU] via SUBCUTANEOUS
  Administered 2019-08-10: 2 [IU] via SUBCUTANEOUS
  Administered 2019-08-10: 18:00:00 3 [IU] via SUBCUTANEOUS
  Administered 2019-08-11 – 2019-08-14 (×7): 2 [IU] via SUBCUTANEOUS
  Administered 2019-08-15: 09:00:00 3 [IU] via SUBCUTANEOUS
  Administered 2019-08-15 – 2019-08-16 (×3): 2 [IU] via SUBCUTANEOUS

## 2019-07-14 NOTE — Progress Notes (Signed)
Peripherally Inserted Central Catheter/Midline Placement  The IV Nurse has discussed with the patient and/or persons authorized to consent for the patient, the purpose of this procedure and the potential benefits and risks involved with this procedure.  The benefits include less needle sticks, lab draws from the catheter, and the patient may be discharged home with the catheter. Risks include, but not limited to, infection, bleeding, blood clot (thrombus formation), and puncture of an artery; nerve damage and irregular heartbeat and possibility to perform a PICC exchange if needed/ordered by physician.  Alternatives to this procedure were also discussed.  Bard Power PICC patient education guide, fact sheet on infection prevention and patient information card has been provided to patient /or left at bedside.    PICC/Midline Placement Documentation  PICC Single Lumen [id number] PICC Right Basilic 46 cm 1 cm (Active)  Indication for Insertion or Continuance of Line Home intravenous therapies (PICC only) 07/14/19 1204  Exposed Catheter (cm) 0 cm 07/14/19 1204  Site Assessment Clean;Dry;Intact 07/14/19 1204  Line Status Flushed;Blood return noted;Saline locked 07/14/19 1204  Dressing Type Transparent 07/14/19 1204  Dressing Status Clean;Dry;Intact;Antimicrobial disc in place 07/14/19 1204  Dressing Change Due 07/21/19 07/14/19 1204       Scotty Court 07/14/2019, 12:07 PM

## 2019-07-14 NOTE — Progress Notes (Signed)
PROGRESS NOTE  Bradley Cox [id number] DOB: January 10, 1971   PCP: Simona Huh, NP  Patient is from: Home  DOA: 07/05/2019 LOS: 8  Brief Narrative / Interim history: 48 year old male with history of obesity, asthma, hypertension, GERD, nephrolithiasis, diabetes mellitus who presented with low back pain radiating to the right gluteus/thigh ICU with progressive decline in the right lower extremity strength, difficulty ambulation over 2 to 3 weeks.  On presentation he was found to be febrile, tachycardic, tachypneic.  Blood glucose was elevated with increased anion gap.  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  Patient was started on broad-spectrum antibiotics, insulin for DKA.  Neurosurgery, ID consulted.  Patient underwent IR guided his paraspinal abscess drainage.  Blood cultures now growing MSSA.  Currently on cefazolin.  Patient had episode of nausea and vomiting the night of 9/9.  Vomiting was nonbilious.  KUB obtained and revealed marked gaseous dilation of stomach and nonobstructive gas pattern and small and large bowel concerning for ileus. NG tube was placed for decompression.  However, NG tube came out the next morning.  Patient has not had further emesis.  Passing gas but has not has bowel movement.  Subjective: No major events overnight of this morning.  Had better sleep last night.  Still with some pain in his right leg and right hip.  Drain taken off yesterday.  Still has not had bowel movement but passing gas.  Denies chest pain, dyspnea, abdominal pain, nausea or vomiting.  Objective: Vitals:   07/13/19 2122 07/13/19 2251 07/14/19 0350 07/14/19 1231  BP: 127/81 (!) 107/58 120/85 121/76  Pulse: (!) 108 (!) 115 (!) 103 93  Resp: (!) 24 18 (!) 22 19  Temp: 99 F (37.2 C) 98.5 F (36.9 C) 98.2 F (36.8 C) 99.3 F (37.4 C)  TempSrc: Oral Oral Oral Oral  SpO2: 92% 94% 97%  98%  Weight:      Height:        Intake/Output Summary (Last 24 hours) at 07/14/2019 1302 Last data filed at 07/14/2019 1234 Gross per 24 hour  Intake 486.99 ml  Output 16875 ml  Net -16388.01 ml   Filed Weights   07/05/19 1807 07/06/19 2116  Weight: (!) 167.4 kg (!) 164 kg    Examination:  GENERAL: No acute distress.  HEENT: MMM.  Vision and hearing grossly intact.  NECK: Supple.  No apparent JVD.  RESP:  No IWOB. Good air movement bilaterally. CVS:  RRR. Heart sounds normal.  ABD/GI/GU: Bowel sounds present. Soft. Non tender.  MSK/EXT:  Moves extremities.  Erythema behind his right knee.  Mild tenderness.  Mild swelling of RLE. SKIN: Erythema over right posterior calf proximally. NEURO: Awake, alert and oriented appropriately.  No gross deficit.  PSYCH: Calm. Normal affect.   Assessment & Plan: Sepsis/MSSA bacteremia/L3-S1 epidural abscess/right iliopsoas and gluteal abscess/right lower extremity cellulitis -Lower extremity Doppler negative for DVT. -Currently on cefazolin and Flagyl per ID.  Plan is 6 to 8 weeks of IV Ancef.  IV Flagyl to be discontinued once drain come out. -Blood and tissue cultures on 9/4-MSSA.   -Repeat blood culture on 9/7- so far. -TTE and TEE did not show any vegetation.   -Appreciate Ortho trauma input-does not feel repeat I&D is warranted. -Continue pain management with scheduled bowel regimen until he has BM.  May have to consider enema. -PICC line 9/12. -PT/OT evaluation -CIR  Ileus/constipation -Status post NG  tube decompression of gastric gas.  -No further emesis.  Passing gases but no BM yet. -Advance diet to regular-this could help with constipation. -Scheduled MiraLAX, Senokot and Reglan.  If no improvement, may consider suppository with enema. -Mobilize as able which has been difficult.  Uncontrolled NIDDM-2/DKA: DKA resolved. Hemoglobin A1c of 12.6.  Hyper and hypoglycemia. -Continue NPH to 23 units twice daily -Continue mealtime  at 8 units. -Continue SSI-moderate -Diabetic coordinator  Hyponatremia with hypochloremia: Improved. -Discontinue IV fluid.  AKI: Resolved with IV hydration  Hypokalemia: Resolved.  Hypertension:  Normotensive. -Continue hydralazine scheduled and as needed.  GERD: Continue PPI  Fatty liver:   Could be associated  with history of  diabetes, obesity  Morbid obesity: BMI 42.6  DVT prophylaxis: Subcu Lovenox Code Status: Full code Family Communication: Per patient and RN. Disposition Plan: Remains inpatient pending clinical improvement and clearance by consultants.  Final disposition CIR early next week pending improvement Consultants: Ortho trauma, infectious disease  Procedures:  9/6-incision and drainage 9/9-TEE  Microbiology summarized: 9/4-SARS-CoV-2 screen negative 9/4-blood and tissue cultures-MSSA 9/7-blood cultures negative.  Antimicrobials: Anti-infectives (From admission, onward)   Start     Dose/Rate Route Frequency Ordered Stop   07/10/19 1600  metroNIDAZOLE (FLAGYL) IVPB 500 mg  Status:  Discontinued     500 mg 100 mL/hr over 60 Minutes Intravenous Every 8 hours 07/10/19 1549 07/13/19 0939   07/09/19 0839  vancomycin (VANCOCIN) powder  Status:  Discontinued       As needed 07/09/19 0839 07/09/19 0935   07/09/19 0839  tobramycin (NEBCIN) powder  Status:  Discontinued       As needed 07/09/19 0840 07/09/19 0935   07/07/19 1830  ceFAZolin (ANCEF) IVPB 2g/100 mL premix     2 g 200 mL/hr over 30 Minutes Intravenous Every 8 hours 07/07/19 1333     07/07/19 0900  ceFAZolin (ANCEF) IVPB 2g/100 mL premix  Status:  Discontinued     2 g 200 mL/hr over 30 Minutes Intravenous Every 8 hours 07/07/19 0847 07/07/19 1333   07/06/19 2030  clindamycin (CLEOCIN) IVPB 600 mg  Status:  Discontinued     600 mg 100 mL/hr over 30 Minutes Intravenous Every 8 hours 07/06/19 2010 07/07/19 0847   07/06/19 1800  vancomycin (VANCOCIN) 1,500 mg in sodium chloride 0.9 % 500 mL  IVPB  Status:  Discontinued     1,500 mg 250 mL/hr over 120 Minutes Intravenous Every 12 hours 07/06/19 0820 07/07/19 0847   07/06/19 1400  ceFEPIme (MAXIPIME) 2 g in sodium chloride 0.9 % 100 mL IVPB  Status:  Discontinued     2 g 200 mL/hr over 30 Minutes Intravenous Every 8 hours 07/06/19 0504 07/07/19 0847   07/06/19 1400  vancomycin (VANCOCIN) 2,000 mg in sodium chloride 0.9 % 500 mL IVPB  Status:  Discontinued     2,000 mg 250 mL/hr over 120 Minutes Intravenous Every 8 hours 07/06/19 0504 07/06/19 0818   07/06/19 0745  clindamycin (CLEOCIN) IVPB 600 mg  Status:  Discontinued     600 mg 100 mL/hr over 30 Minutes Intravenous  Once 07/06/19 0737 07/06/19 2029   07/06/19 0500  ceFEPIme (MAXIPIME) 2 g in sodium chloride 0.9 % 100 mL IVPB     2 g 200 mL/hr over 30 Minutes Intravenous  Once 07/06/19 0451 07/06/19 1335   07/06/19 0500  vancomycin (VANCOCIN) 2,500 mg in sodium chloride 0.9 % 500 mL IVPB     2,500 mg 250 mL/hr over 120 Minutes Intravenous  Once 07/06/19 0451 07/06/19 1335      Sch Meds:  Scheduled Meds: . acetaminophen  650 mg Oral Q6H   Or  . acetaminophen  650 mg Rectal Q6H  . Chlorhexidine Gluconate Cloth  6 each Topical Daily  . enoxaparin (LOVENOX) injection  0.5 mg/kg Subcutaneous Q24H  . gabapentin  300 mg Oral TID  . hydrALAZINE  25 mg Oral BID  . insulin aspart  0-15 Units Subcutaneous TID WC  . insulin aspart  0-5 Units Subcutaneous QHS  . insulin aspart  8 Units Subcutaneous TID WC  . insulin NPH Human  23 Units Subcutaneous BID AC & HS  . metoCLOPramide  5 mg Oral TID AC  . polyethylene glycol  17 g Oral BID  . senna-docusate  1 tablet Oral BID  . sodium chloride flush  10-40 mL Intracatheter Q12H  . sodium chloride flush  3 mL Intravenous Q12H   Continuous Infusions: . sodium chloride 125 mL/hr at 07/14/19 0042  .  ceFAZolin (ANCEF) IV 2 g (07/14/19 0354)   PRN Meds:.albuterol, fentaNYL (SUBLIMAZE) injection, hydrALAZINE, methocarbamol,  ondansetron **OR** ondansetron (ZOFRAN) IV, oxyCODONE, sodium chloride flush, zolpidem   I have personally reviewed the following labs and images: CBC: Recent Labs  Lab 07/08/19 1019 07/09/19 0248 07/10/19 0521 07/12/19 0314 07/13/19 0830  WBC 6.3 5.9 8.6 10.0 11.5*  HGB 10.7* 10.4* 10.2* 10.1* 10.4*  HCT 31.6* 31.0* 31.0* 31.1* 33.4*  MCV 90.5 89.6 90.9 91.7 97.4  PLT 172 168 216 334 372   BMP &GFR Recent Labs  Lab 07/09/19 0248 07/10/19 0521 07/11/19 0345 07/12/19 0314 07/13/19 0830  NA 124* 127* 128* 131* 131*  K 3.8 3.9 3.5 3.4* 4.1  CL 86* 88* 87* 89* 92*  CO2 28 27 32 28 29  GLUCOSE 276* 312* 247* 187* 141*  BUN 6 5* 5* <5* <5*  CREATININE 0.55* 0.69 0.63 0.59* 0.53*  CALCIUM 8.0* 7.9* 8.3* 8.1* 8.1*  MG  --   --   --  1.9 1.9   Estimated Creatinine Clearance: 188.1 mL/min (A) (by C-G formula based on SCr of 0.53 mg/dL (L)). Liver & Pancreas: No results for input(s): AST, ALT, ALKPHOS, BILITOT, PROT, ALBUMIN in the last 168 hours. No results for input(s): LIPASE, AMYLASE in the last 168 hours. No results for input(s): AMMONIA in the last 168 hours. Diabetic: No results for input(s): HGBA1C in the last 72 hours. Recent Labs  Lab 07/13/19 1719 07/13/19 2120 07/13/19 2250 07/14/19 0846 07/14/19 1233  GLUCAP 137* 58* 127* 206* 143*   Cardiac Enzymes: No results for input(s): CKTOTAL, CKMB, CKMBINDEX, TROPONINI in the last 168 hours. No results for input(s): PROBNP in the last 8760 hours. Coagulation Profile: No results for input(s): INR, PROTIME in the last 168 hours. Thyroid Function Tests: No results for input(s): TSH, T4TOTAL, FREET4, T3FREE, THYROIDAB in the last 72 hours. Lipid Profile: No results for input(s): CHOL, HDL, LDLCALC, TRIG, CHOLHDL, LDLDIRECT in the last 72 hours. Anemia Panel: No results for input(s): VITAMINB12, FOLATE, FERRITIN, TIBC, IRON, RETICCTPCT in the last 72 hours. Urine analysis:    Component Value Date/Time    COLORURINE YELLOW 07/06/2019 [place] 07/06/2019 0437   LABSPEC 1.024 07/06/2019 0437   PHURINE 5.0 07/06/2019 0437   GLUCOSEU >=500 (A) 07/06/2019 0437   HGBUR MODERATE (A) 07/06/2019 [place] NEGATIVE 07/06/2019 0437   KETONESUR 80 (A) 07/06/2019 [place] NEGATIVE 07/06/2019 0437   NITRITE NEGATIVE 07/06/2019 0437  LEUKOCYTESUR NEGATIVE 07/06/2019 0437   Sepsis Labs: Invalid input(s): PROCALCITONIN, [place]  Microbiology: Recent Results (from the past 240 hour(s))  Blood culture (routine x 2)     Status: Abnormal   Collection Time: 07/06/19  4:30 AM   Specimen: BLOOD  Result Value Ref Range Status   Specimen Description BLOOD SITE NOT SPECIFIED  Final   Special Requests   Final    BOTTLES DRAWN AEROBIC AND ANAEROBIC Blood Culture results may not be optimal due to an inadequate volume of blood received in culture bottles   Culture  Setup Time   Final    IN BOTH AEROBIC AND ANAEROBIC BOTTLES GRAM POSITIVE COCCI Organism ID to follow CRITICAL RESULT CALLED TO, READ BACK BY AND VERIFIED WITHDion Body [facility] 07/06/19 2058 JDW Performed at [facility], [address] [place], [place] [address]    Culture STAPHYLOCOCCUS AUREUS (A)  Final   Report Status 07/08/2019 FINAL  Final   Organism ID, Bacteria STAPHYLOCOCCUS AUREUS  Final      Susceptibility   Staphylococcus aureus - MIC*    CIPROFLOXACIN <=0.5 SENSITIVE Sensitive     ERYTHROMYCIN >=8 RESISTANT Resistant     GENTAMICIN <=0.5 SENSITIVE Sensitive     OXACILLIN <=0.25 SENSITIVE Sensitive     TETRACYCLINE >=16 RESISTANT Resistant     VANCOMYCIN 1 SENSITIVE Sensitive     TRIMETH/SULFA <=10 SENSITIVE Sensitive     CLINDAMYCIN RESISTANT Resistant     RIFAMPIN <=0.5 SENSITIVE Sensitive     Inducible Clindamycin POSITIVE Resistant     * STAPHYLOCOCCUS AUREUS  Blood culture (routine x 2)     Status: Abnormal   Collection Time: 07/06/19  4:30 AM   Specimen: BLOOD  Result Value Ref  Range Status   Specimen Description BLOOD SITE NOT SPECIFIED  Final   Special Requests   Final    BOTTLES DRAWN AEROBIC AND ANAEROBIC Blood Culture adequate volume   Culture  Setup Time   Final    IN BOTH AEROBIC AND ANAEROBIC BOTTLES GRAM POSITIVE COCCI CRITICAL VALUE NOTED.  VALUE IS CONSISTENT WITH PREVIOUSLY REPORTED AND CALLED VALUE.    Culture (A)  Final    STAPHYLOCOCCUS AUREUS SUSCEPTIBILITIES PERFORMED ON PREVIOUS CULTURE WITHIN THE LAST 5 DAYS. Performed at [facility], [place] [address], [place], [place] [address]    Report Status 07/08/2019 FINAL  Final  Blood Culture ID Panel (Reflexed)     Status: Abnormal   Collection Time: 07/06/19  4:30 AM  Result Value Ref Range Status   Enterococcus species NOT DETECTED NOT DETECTED Final   Listeria monocytogenes NOT DETECTED NOT DETECTED Final   Staphylococcus species DETECTED (A) NOT DETECTED Final    Comment: CRITICAL RESULT CALLED TO, READ BACK BY AND VERIFIED WITH: J MILLEN PHARMD 07/06/19 2058 JDW    Staphylococcus aureus (BCID) DETECTED (A) NOT DETECTED Final    Comment: Methicillin (oxacillin) susceptible Staphylococcus aureus (MSSA). Preferred therapy is anti staphylococcal beta lactam antibiotic (Cefazolin or Nafcillin), unless clinically contraindicated. CRITICAL RESULT CALLED TO, READ BACK BY AND VERIFIED WITH: J MILLEN PHARMD 07/06/19 2058 JDW    Methicillin resistance NOT DETECTED NOT DETECTED Final   Streptococcus species NOT DETECTED NOT DETECTED Final   Streptococcus agalactiae NOT DETECTED NOT DETECTED Final   Streptococcus pneumoniae NOT DETECTED NOT DETECTED Final   Streptococcus pyogenes NOT DETECTED NOT DETECTED Final   Acinetobacter baumannii NOT DETECTED NOT DETECTED Final   Enterobacteriaceae species NOT DETECTED NOT DETECTED Final   Enterobacter  cloacae complex NOT DETECTED NOT DETECTED Final   Escherichia coli NOT DETECTED NOT DETECTED Final   Klebsiella oxytoca NOT DETECTED NOT DETECTED Final    Klebsiella pneumoniae NOT DETECTED NOT DETECTED Final   Proteus species NOT DETECTED NOT DETECTED Final   Serratia marcescens NOT DETECTED NOT DETECTED Final   Haemophilus influenzae NOT DETECTED NOT DETECTED Final   Neisseria meningitidis NOT DETECTED NOT DETECTED Final   Pseudomonas aeruginosa NOT DETECTED NOT DETECTED Final   Candida albicans NOT DETECTED NOT DETECTED Final   Candida glabrata NOT DETECTED NOT DETECTED Final   Candida krusei NOT DETECTED NOT DETECTED Final   Candida parapsilosis NOT DETECTED NOT DETECTED Final   Candida tropicalis NOT DETECTED NOT DETECTED Final    Comment: Performed at [facility], [place] [address] [place], [place] [address]  Urine culture     Status: Abnormal   Collection Time: 07/06/19  4:37 AM   Specimen: Urine, Random  Result Value Ref Range Status   Specimen Description URINE, RANDOM  Final   Special Requests   Final    NONE Performed at [facility], [place] [address], [place], [place] [address]    Culture (A)  Final    >=100,000 COLONIES/mL MULTIPLE SPECIES PRESENT, SUGGEST RECOLLECTION   Report Status 07/07/2019 FINAL  Final  SARS Coronavirus 2 [employer] order, Performed in [facility] hospital lab) Nasopharyngeal Nasopharyngeal Swab     Status: None   Collection Time: 07/06/19  8:04 AM   Specimen: Nasopharyngeal Swab  Result Value Ref Range Status   SARS Coronavirus 2 NEGATIVE NEGATIVE Final    Comment: (NOTE) If result is NEGATIVE SARS-CoV-2 target nucleic acids are NOT DETECTED. The SARS-CoV-2 RNA is generally detectable in upper and lower  respiratory specimens during the acute phase of infection. The lowest  concentration of SARS-CoV-2 viral copies this assay can detect is 250  copies / mL. A negative result does not preclude SARS-CoV-2 infection  and should not be used as the sole basis for treatment or other  patient management decisions.  A negative result may occur with  improper specimen collection / handling,  submission of specimen other  than nasopharyngeal swab, presence of viral mutation(s) within the  areas targeted by this assay, and inadequate number of viral copies  (<250 copies / mL). A negative result must be combined with clinical  observations, patient history, and epidemiological information. If result is POSITIVE SARS-CoV-2 target nucleic acids are DETECTED. The SARS-CoV-2 RNA is generally detectable in upper and lower  respiratory specimens dur ing the acute phase of infection.  Positive  results are indicative of active infection with SARS-CoV-2.  Clinical  correlation with patient history and other diagnostic information is  necessary to determine patient infection status.  Positive results do  not rule out bacterial infection or co-infection with other viruses. If result is PRESUMPTIVE POSTIVE SARS-CoV-2 nucleic acids MAY BE PRESENT.   A presumptive positive result was obtained on the submitted specimen  and confirmed on repeat testing.  While 2019 novel coronavirus  (SARS-CoV-2) nucleic acids may be present in the submitted sample  additional confirmatory testing may be necessary for epidemiological  and / or clinical management purposes  to differentiate between  SARS-CoV-2 and other Sarbecovirus currently known to infect humans.  If clinically indicated additional testing with an alternate test  methodology [phone]) is advised. The SARS-CoV-2 RNA is generally  detectable in upper and lower respiratory sp ecimens during the acute  phase of infection.  The expected result is Negative. Fact Sheet for Patients:  StrictlyIdeas.no Fact Sheet for Healthcare Providers: BankingDealers.co.za This test is not yet approved or cleared by the [place] FDA and has been authorized for detection and/or diagnosis of SARS-CoV-2 by FDA under an Emergency Use Authorization (EUA).  This EUA will remain in effect (meaning this test can be  used) for the duration of the COVID-19 declaration under Section 564(b)(1) of the Act, 21 U.S.C. section 360bbb-3(b)(1), unless the authorization is terminated or revoked sooner. Performed at [facility], [place] [address], [place], [place] [address]   Culture, fungus without smear     Status: None (Preliminary result)   Collection Time: 07/06/19  6:48 PM   Specimen: Abscess; Other  Result Value Ref Range Status   Specimen Description ABSCESS PARASPINAL  Final   Special Requests NONE  Final   Culture   Final    NO FUNGUS ISOLATED AFTER 3 DAYS Performed at [facility], [address] [place], [place] [address]    Report Status PENDING  Incomplete  Aerobic/Anaerobic Culture (surgical/deep wound)     Status: None   Collection Time: 07/06/19  6:48 PM   Specimen: Abscess  Result Value Ref Range Status   Specimen Description ABSCESS  Final   Special Requests PARASPINAL  Final   Gram Stain   Final    FEW WBC PRESENT, PREDOMINANTLY PMN ABUNDANT GRAM POSITIVE COCCI    Culture   Final    ABUNDANT STAPHYLOCOCCUS AUREUS NO ANAEROBES ISOLATED Performed at [facility], [place] [address], [place], [place] [address]    Report Status 07/12/2019 FINAL  Final   Organism ID, Bacteria STAPHYLOCOCCUS AUREUS  Final      Susceptibility   Staphylococcus aureus - MIC*    CIPROFLOXACIN <=0.5 SENSITIVE Sensitive     ERYTHROMYCIN >=8 RESISTANT Resistant     GENTAMICIN <=0.5 SENSITIVE Sensitive     OXACILLIN <=0.25 SENSITIVE Sensitive     TETRACYCLINE >=16 RESISTANT Resistant     VANCOMYCIN 1 SENSITIVE Sensitive     TRIMETH/SULFA <=10 SENSITIVE Sensitive     CLINDAMYCIN RESISTANT Resistant     RIFAMPIN <=0.5 SENSITIVE Sensitive     Inducible Clindamycin POSITIVE Resistant     * ABUNDANT STAPHYLOCOCCUS AUREUS  Aerobic/Anaerobic Culture (surgical/deep wound)     Status: None (Preliminary result)   Collection Time: 07/09/19  8:32 AM   Specimen: Abscess  Result Value Ref Range Status    Specimen Description ABSCESS HIP RIGHT  Final   Special Requests NONE  Final   Gram Stain   Final    MODERATE WBC PRESENT,BOTH PMN AND MONONUCLEAR ABUNDANT GRAM POSITIVE COCCI IN PAIRS IN CLUSTERS Performed at [facility], [address] [place],  [address]    Culture   Final    FEW STAPHYLOCOCCUS AUREUS NO ANAEROBES ISOLATED; CULTURE IN PROGRESS FOR 5 DAYS    Report Status PENDING  Incomplete   Organism ID, Bacteria STAPHYLOCOCCUS AUREUS  Final      Susceptibility   Staphylococcus aureus - MIC*    CIPROFLOXACIN <=0.5 SENSITIVE Sensitive     ERYTHROMYCIN >=8 RESISTANT Resistant     GENTAMICIN <=0.5 SENSITIVE Sensitive     OXACILLIN <=0.25 SENSITIVE Sensitive     TETRACYCLINE >=16 RESISTANT Resistant     VANCOMYCIN 1 SENSITIVE Sensitive     TRIMETH/SULFA <=10 SENSITIVE Sensitive     CLINDAMYCIN RESISTANT Resistant     RIFAMPIN <=0.5 SENSITIVE Sensitive  Inducible Clindamycin POSITIVE Resistant     * FEW STAPHYLOCOCCUS AUREUS  Culture, blood (routine x 2)     Status: None   Collection Time: 07/09/19 12:57 PM   Specimen: BLOOD RIGHT HAND  Result Value Ref Range Status   Specimen Description BLOOD RIGHT HAND  Final   Special Requests   Final    AEROBIC BOTTLE ONLY Blood Culture results may not be optimal due to an inadequate volume of blood received in culture bottles   Culture   Final    NO GROWTH 5 DAYS Performed at [facility], [place] [address] [place], [place] [address]    Report Status 07/14/2019 FINAL  Final  Culture, blood (routine x 2)     Status: None   Collection Time: 07/09/19  1:06 PM   Specimen: BLOOD RIGHT HAND  Result Value Ref Range Status   Specimen Description BLOOD RIGHT HAND  Final   Special Requests AEROBIC BOTTLE ONLY Blood Culture adequate volume  Final   Culture   Final    NO GROWTH 5 DAYS Performed at [facility], [place] [address] [place], [place] [address]    Report Status 07/14/2019 FINAL  Final    Radiology  Studies: [place] Ekg Site Rite  Result Date: 07/14/2019 If Site Rite image not attached, placement could not be confirmed due to current cardiac rhythm.   Daijanae Rafalski T. [place]  If 7PM-7AM, please contact night-coverage [email or web address] Password [email or web address] 07/14/2019, 1:02 PM

## 2019-07-14 NOTE — Progress Notes (Signed)
[facility]  Date of Admission:  07/05/2019      Total days of antibiotics 9  Day 7 cefazolin          ASSESSMENT: Bradley Cox is a 48 y.o. white male with MSSA Bacteremia complicated by [id number] spinal epidural abscess with paraspinal abscesses and large right gluteal abscess now s/p I&D; hemavac drain remains. He was bacteremic with MSSA at presentation.   PLAN: MSSA Bacteremia  - As only MSSA has grown from paraspinal, gluteal, and blood cxs will narrow ABX further by d/c'ing flagyl and continuing with cefazolin monotherapy. TEE was not definitive as showed an echodensity along the AV that was favored as a redundant chordae tendinae. Agree with cardiology's plan to repeat TEE in 2 weeks to re-assess. No spinal diskitis has been observed, so uncertain of basis for 8 weeks of treatment at this time. Would focus attention towards proper drainage management of his 2 abscesses (gluteal and epidural/paraspinal) instead. Agree with plan to desensitize to PCN as nafcillin mightly be slightly superior treatment in this scenario. His blood cultures show clearance at 4 days, so may proceed with PICC line placement when ready.   L3-S1 Epidural Abscess  - 4 week course IV therapy to target MSSA, which has been recovered from all cultures taken including blood and right hip. NSU has evaluated and stated no indication for surgical intervention. He still would benefit from an IR aspiration of his epidural abscess, however.  Gluteal Abscess - his surgical drain was removed yesterday. He is being recommended to ultimately transfer to CIR for rehab/therapy after he is medically ready from current hospitalization. Anticipate duration of ABX to be 4 weeks from the time of his most recent gluteal debridement, recognizing that his gluteal abscess cavity is quite large (over half a liter of purulent fluid removed from this space) and will be prone to re-accumulate. Will defer to  orthopedic surgeons to definitive operative management at this site.  RT Calf Erythema/Swelling - no DVT or abscess noted on U/S. Still red and swollen but not tender.   Penicillin allergy - he is tolerating the cefazolin well. We discussed with him performing PCN allergy testing while inpatient and he would like to pursue this. Will plan for this on Monday.   New Diabetes diagnosis - inpatient coordinator following him and assisting with recs  Localized Rash to RT forearm - improving, likely contact derm from tape. Would wait to clear up more prior to PCN skin testing.   Will follow back up on Monday. Please call with any concerns related to antibiotics.    Principal Problem:   MSSA bacteremia Active Problems:   Obesity, Class III, BMI 40-49.9 (morbid obesity) (HCC)   Essential hypertension   Sepsis ([place])   AKI (acute kidney injury) ([place])   Hyperkalemia   Abscess of right hip   Paraspinal abscess (HCC)   Compression of right sciatic nerve   . acetaminophen  650 mg Oral Q6H   Or  . acetaminophen  650 mg Rectal Q6H  . Chlorhexidine Gluconate Cloth  6 each Topical Daily  . enoxaparin (LOVENOX) injection  0.5 mg/kg Subcutaneous Q24H  . gabapentin  300 mg Oral TID  . hydrALAZINE  25 mg Oral BID  . insulin aspart  0-15 Units Subcutaneous TID WC  . insulin aspart  0-5 Units Subcutaneous QHS  . insulin aspart  8 Units Subcutaneous TID WC  . insulin NPH Human  23 Units Subcutaneous BID AC & HS  . metoCLOPramide  5 mg Oral TID AC  . polyethylene glycol  17 g Oral BID  . senna-docusate  1 tablet Oral BID  . sodium chloride flush  10-40 mL Intracatheter Q12H  . sodium chloride flush  3 mL Intravenous Q12H     SUBJECTIVE: His surgical drain removed yesterday by ortho. Pt's appetite improving but gluteal/hip pain persisting/slightly worsened compared to yesterday. No ambulation from bed yet today.  Temp Readings from Last 1 Encounters:  07/14/19 99.3 F (37.4 C) (Oral)   Lab  Results  Component Value Date   WBC 11.5 (H) 07/13/2019   Lab Results  Component Value Date   CREATININE 0.53 (L) 07/13/2019   Lab Results  Component Value Date   CRP 43.5 (H) 07/06/2019    Review of Systems: Review of Systems  Constitutional: Negative for chills, fever and weight loss.  HENT: Negative for congestion, hearing loss, sinus pain and sore throat.   Eyes: Negative for blurred vision, photophobia and discharge.  Respiratory: Negative for cough, hemoptysis, sputum production and shortness of breath.   Cardiovascular: Positive for leg swelling (R>L). Negative for chest pain, palpitations and orthopnea.  Gastrointestinal: Negative for abdominal pain, constipation, diarrhea, heartburn, nausea and vomiting.  Genitourinary: Negative for dysuria, flank pain, frequency and urgency.  Musculoskeletal: Positive for back pain and joint pain (R hip / leg). Negative for myalgias.  Skin: Positive for rash (rash on right forearm). Negative for itching.  Neurological: Negative for dizziness, tremors, seizures, weakness and headaches.  Endo/Heme/Allergies: Negative for polydipsia. Does not bruise/bleed easily.  Psychiatric/Behavioral: Negative for depression and substance abuse. The patient is not nervous/anxious and does not have insomnia.     Allergies  Allergen Reactions  . Penicillins Anaphylaxis    Did it involve swelling of the face/tongue/throat, SOB, or low BP? Yes Did it involve sudden or severe rash/hives, skin peeling, or any reaction on the inside of your mouth or nose? No Did you need to seek medical attention at a hospital or doctor's office? Yes When did it last happen?4 months old If all above answers are "NO", may proceed with cephalosporin use.   . Tramadol Anaphylaxis    OBJECTIVE: Vitals:   07/13/19 2122 07/13/19 2251 07/14/19 0350 07/14/19 1231  BP: 127/81 (!) 107/58 120/85 121/76  Pulse: (!) 108 (!) 115 (!) 103 93  Resp: (!) 24 18 (!) 22 19  Temp: 99 F  (37.2 C) 98.5 F (36.9 C) 98.2 F (36.8 C) 99.3 F (37.4 C)  TempSrc: Oral Oral Oral Oral  SpO2: 92% 94% 97% 98%  Weight:      Height:       Body mass index is 41.78 kg/m.  Physical Exam Constitutional:      Comments: Resting in recliner  Eyes:     General: No scleral icterus.    Pupils: Pupils are equal, round, and reactive to light.  Neck:     Musculoskeletal: Normal range of motion.  Cardiovascular:     Rate and Rhythm: Normal rate and regular rhythm.     Heart sounds: No murmur.  Pulmonary:     Effort: Pulmonary effort is normal. No respiratory distress.     Breath sounds: Normal breath sounds.  Musculoskeletal:     Comments: RLE swelling improved. Drain removed. 5/5 strength.   Skin:    General: Skin is warm and dry.     Capillary Refill: Capillary refill takes less than 2 seconds.  Comments: Rash appears to be faded  Neurological:     General: No focal deficit present.     Mental Status: He is oriented to person, place, and time.     Lab Results Lab Results  Component Value Date   WBC 11.5 (H) 07/13/2019   HGB 10.4 (L) 07/13/2019   HCT 33.4 (L) 07/13/2019   MCV 97.4 07/13/2019   PLT 372 07/13/2019    Lab Results  Component Value Date   CREATININE 0.53 (L) 07/13/2019   BUN <5 (L) 07/13/2019   NA 131 (L) 07/13/2019   K 4.1 07/13/2019   CL 92 (L) 07/13/2019   CO2 29 07/13/2019    Lab Results  Component Value Date   ALT 19 07/06/2019   AST 25 07/06/2019   ALKPHOS 84 07/06/2019   BILITOT 1.0 07/06/2019     Microbiology: Recent Results (from the past 240 hour(s))  Blood culture (routine x 2)     Status: Abnormal   Collection Time: 07/06/19  4:30 AM   Specimen: BLOOD  Result Value Ref Range Status   Specimen Description BLOOD SITE NOT SPECIFIED  Final   Special Requests   Final    BOTTLES DRAWN AEROBIC AND ANAEROBIC Blood Culture results may not be optimal due to an inadequate volume of blood received in culture bottles   Culture  Setup Time    Final    IN BOTH AEROBIC AND ANAEROBIC BOTTLES GRAM POSITIVE COCCI Organism ID to follow CRITICAL RESULT CALLED TO, READ BACK BY AND VERIFIED WITHDion Body [facility] 07/06/19 2058 JDW Performed at [facility], [place] [address], [place], [place] [address]    Culture STAPHYLOCOCCUS AUREUS (A)  Final   Report Status 07/08/2019 FINAL  Final   Organism ID, Bacteria STAPHYLOCOCCUS AUREUS  Final      Susceptibility   Staphylococcus aureus - MIC*    CIPROFLOXACIN <=0.5 SENSITIVE Sensitive     ERYTHROMYCIN >=8 RESISTANT Resistant     GENTAMICIN <=0.5 SENSITIVE Sensitive     OXACILLIN <=0.25 SENSITIVE Sensitive     TETRACYCLINE >=16 RESISTANT Resistant     VANCOMYCIN 1 SENSITIVE Sensitive     TRIMETH/SULFA <=10 SENSITIVE Sensitive     CLINDAMYCIN RESISTANT Resistant     RIFAMPIN <=0.5 SENSITIVE Sensitive     Inducible Clindamycin POSITIVE Resistant     * STAPHYLOCOCCUS AUREUS  Blood culture (routine x 2)     Status: Abnormal   Collection Time: 07/06/19  4:30 AM   Specimen: BLOOD  Result Value Ref Range Status   Specimen Description BLOOD SITE NOT SPECIFIED  Final   Special Requests   Final    BOTTLES DRAWN AEROBIC AND ANAEROBIC Blood Culture adequate volume   Culture  Setup Time   Final    IN BOTH AEROBIC AND ANAEROBIC BOTTLES GRAM POSITIVE COCCI CRITICAL VALUE NOTED.  VALUE IS CONSISTENT WITH PREVIOUSLY REPORTED AND CALLED VALUE.    Culture (A)  Final    STAPHYLOCOCCUS AUREUS SUSCEPTIBILITIES PERFORMED ON PREVIOUS CULTURE WITHIN THE LAST 5 DAYS. Performed at [facility], [place] [address] [place], [place] [address]    Report Status 07/08/2019 FINAL  Final  Blood Culture ID Panel (Reflexed)     Status: Abnormal   Collection Time: 07/06/19  4:30 AM  Result Value Ref Range Status   Enterococcus species NOT DETECTED NOT DETECTED Final   Listeria monocytogenes NOT DETECTED NOT DETECTED Final   Staphylococcus species DETECTED (A) NOT DETECTED Final    Comment:  CRITICAL RESULT  CALLED TO, READ BACK BY AND VERIFIED WITH: J MILLEN PHARMD 07/06/19 2058 JDW    Staphylococcus aureus (BCID) DETECTED (A) NOT DETECTED Final    Comment: Methicillin (oxacillin) susceptible Staphylococcus aureus (MSSA). Preferred therapy is anti staphylococcal beta lactam antibiotic (Cefazolin or Nafcillin), unless clinically contraindicated. CRITICAL RESULT CALLED TO, READ BACK BY AND VERIFIED WITH: J MILLEN PHARMD 07/06/19 2058 JDW    Methicillin resistance NOT DETECTED NOT DETECTED Final   Streptococcus species NOT DETECTED NOT DETECTED Final   Streptococcus agalactiae NOT DETECTED NOT DETECTED Final   Streptococcus pneumoniae NOT DETECTED NOT DETECTED Final   Streptococcus pyogenes NOT DETECTED NOT DETECTED Final   Acinetobacter baumannii NOT DETECTED NOT DETECTED Final   Enterobacteriaceae species NOT DETECTED NOT DETECTED Final   Enterobacter cloacae complex NOT DETECTED NOT DETECTED Final   Escherichia coli NOT DETECTED NOT DETECTED Final   Klebsiella oxytoca NOT DETECTED NOT DETECTED Final   Klebsiella pneumoniae NOT DETECTED NOT DETECTED Final   Proteus species NOT DETECTED NOT DETECTED Final   Serratia marcescens NOT DETECTED NOT DETECTED Final   Haemophilus influenzae NOT DETECTED NOT DETECTED Final   Neisseria meningitidis NOT DETECTED NOT DETECTED Final   Pseudomonas aeruginosa NOT DETECTED NOT DETECTED Final   Candida albicans NOT DETECTED NOT DETECTED Final   Candida glabrata NOT DETECTED NOT DETECTED Final   Candida krusei NOT DETECTED NOT DETECTED Final   Candida parapsilosis NOT DETECTED NOT DETECTED Final   Candida tropicalis NOT DETECTED NOT DETECTED Final    Comment: Performed at [facility], [place] [address] [place], [place] [address]  Urine culture     Status: Abnormal   Collection Time: 07/06/19  4:37 AM   Specimen: Urine, Random  Result Value Ref Range Status   Specimen Description URINE, RANDOM  Final   Special Requests   Final    NONE Performed at  [facility], [place] [address] [place], [place] [address]    Culture (A)  Final    >=100,000 COLONIES/mL MULTIPLE SPECIES PRESENT, SUGGEST RECOLLECTION   Report Status 07/07/2019 FINAL  Final  SARS Coronavirus 2 [facility] order, Performed in [facility] hospital lab) Nasopharyngeal Nasopharyngeal Swab     Status: None   Collection Time: 07/06/19  8:04 AM   Specimen: Nasopharyngeal Swab  Result Value Ref Range Status   SARS Coronavirus 2 NEGATIVE NEGATIVE Final    Comment: (NOTE) If result is NEGATIVE SARS-CoV-2 target nucleic acids are NOT DETECTED. The SARS-CoV-2 RNA is generally detectable in upper and lower  respiratory specimens during the acute phase of infection. The lowest  concentration of SARS-CoV-2 viral copies this assay can detect is 250  copies / mL. A negative result does not preclude SARS-CoV-2 infection  and should not be used as the sole basis for treatment or other  patient management decisions.  A negative result may occur with  improper specimen collection / handling, submission of specimen other  than nasopharyngeal swab, presence of viral mutation(s) within the  areas targeted by this assay, and inadequate number of viral copies  (<250 copies / mL). A negative result must be combined with clinical  observations, patient history, and epidemiological information. If result is POSITIVE SARS-CoV-2 target nucleic acids are DETECTED. The SARS-CoV-2 RNA is generally detectable in upper and lower  respiratory specimens dur ing the acute phase of infection.  Positive  results are indicative of active infection with SARS-CoV-2.  Clinical  correlation with patient history and other diagnostic information  is  necessary to determine patient infection status.  Positive results do  not rule out bacterial infection or co-infection with other viruses. If result is PRESUMPTIVE POSTIVE SARS-CoV-2 nucleic acids MAY BE PRESENT.   A presumptive positive result was obtained on  the submitted specimen  and confirmed on repeat testing.  While 2019 novel coronavirus  (SARS-CoV-2) nucleic acids may be present in the submitted sample  additional confirmatory testing may be necessary for epidemiological  and / or clinical management purposes  to differentiate between  SARS-CoV-2 and other Sarbecovirus currently known to infect humans.  If clinically indicated additional testing with an alternate test  methodology [phone]) is advised. The SARS-CoV-2 RNA is generally  detectable in upper and lower respiratory sp ecimens during the acute  phase of infection. The expected result is Negative. Fact Sheet for Patients:  StrictlyIdeas.no Fact Sheet for Healthcare Providers: BankingDealers.co.za This test is not yet approved or cleared by the [place] FDA and has been authorized for detection and/or diagnosis of SARS-CoV-2 by FDA under an Emergency Use Authorization (EUA).  This EUA will remain in effect (meaning this test can be used) for the duration of the COVID-19 declaration under Section 564(b)(1) of the Act, 21 U.S.C. section 360bbb-3(b)(1), unless the authorization is terminated or revoked sooner. Performed at [facility], [place] [address] [place], [place] [address]   Culture, fungus without smear     Status: None (Preliminary result)   Collection Time: 07/06/19  6:48 PM   Specimen: Abscess; Other  Result Value Ref Range Status   Specimen Description ABSCESS PARASPINAL  Final   Special Requests NONE  Final   Culture   Final    NO FUNGUS ISOLATED AFTER 3 DAYS Performed at [facility], [address] [place], [place] [address]    Report Status PENDING  Incomplete  Aerobic/Anaerobic Culture (surgical/deep wound)     Status: None   Collection Time: 07/06/19  6:48 PM   Specimen: Abscess  Result Value Ref Range Status   Specimen Description ABSCESS  Final   Special Requests PARASPINAL  Final    Gram Stain   Final    FEW WBC PRESENT, PREDOMINANTLY PMN ABUNDANT GRAM POSITIVE COCCI    Culture   Final    ABUNDANT STAPHYLOCOCCUS AUREUS NO ANAEROBES ISOLATED Performed at [facility], [place] [address], [place], [place] [address]    Report Status 07/12/2019 FINAL  Final   Organism ID, Bacteria STAPHYLOCOCCUS AUREUS  Final      Susceptibility   Staphylococcus aureus - MIC*    CIPROFLOXACIN <=0.5 SENSITIVE Sensitive     ERYTHROMYCIN >=8 RESISTANT Resistant     GENTAMICIN <=0.5 SENSITIVE Sensitive     OXACILLIN <=0.25 SENSITIVE Sensitive     TETRACYCLINE >=16 RESISTANT Resistant     VANCOMYCIN 1 SENSITIVE Sensitive     TRIMETH/SULFA <=10 SENSITIVE Sensitive     CLINDAMYCIN RESISTANT Resistant     RIFAMPIN <=0.5 SENSITIVE Sensitive     Inducible Clindamycin POSITIVE Resistant     * ABUNDANT STAPHYLOCOCCUS AUREUS  Aerobic/Anaerobic Culture (surgical/deep wound)     Status: None (Preliminary result)   Collection Time: 07/09/19  8:32 AM   Specimen: Abscess  Result Value Ref Range Status   Specimen Description ABSCESS HIP RIGHT  Final   Special Requests NONE  Final   Gram Stain   Final    MODERATE WBC PRESENT,BOTH PMN AND MONONUCLEAR ABUNDANT GRAM POSITIVE COCCI IN PAIRS IN CLUSTERS Performed at [facility], [place] [address] [place], [place] [address]    Culture   Final    FEW STAPHYLOCOCCUS AUREUS NO ANAEROBES ISOLATED; CULTURE IN PROGRESS FOR 5 DAYS    Report Status PENDING  Incomplete   Organism ID, Bacteria STAPHYLOCOCCUS AUREUS  Final      Susceptibility   Staphylococcus aureus - MIC*    CIPROFLOXACIN <=0.5 SENSITIVE Sensitive     ERYTHROMYCIN >=8 RESISTANT Resistant     GENTAMICIN <=0.5 SENSITIVE Sensitive     OXACILLIN <=0.25 SENSITIVE Sensitive     TETRACYCLINE >=16 RESISTANT Resistant     VANCOMYCIN 1 SENSITIVE Sensitive     TRIMETH/SULFA <=10 SENSITIVE Sensitive     CLINDAMYCIN RESISTANT Resistant     RIFAMPIN <=0.5 SENSITIVE Sensitive     Inducible  Clindamycin POSITIVE Resistant     * FEW STAPHYLOCOCCUS AUREUS  Culture, blood (routine x 2)     Status: None   Collection Time: 07/09/19 12:57 PM   Specimen: BLOOD RIGHT HAND  Result Value Ref Range Status   Specimen Description BLOOD RIGHT HAND  Final   Special Requests   Final    AEROBIC BOTTLE ONLY Blood Culture results may not be optimal due to an inadequate volume of blood received in culture bottles   Culture   Final    NO GROWTH 5 DAYS Performed at [facility], [place] [address] [place], [place] [address]    Report Status 07/14/2019 FINAL  Final  Culture, blood (routine x 2)     Status: None   Collection Time: 07/09/19  1:06 PM   Specimen: BLOOD RIGHT HAND  Result Value Ref Range Status   Specimen Description BLOOD RIGHT HAND  Final   Special Requests AEROBIC BOTTLE ONLY Blood Culture adequate volume  Final   Culture   Final    NO GROWTH 5 DAYS Performed at [facility], [place] [address], [place], [place] [address]    Report Status 07/14/2019 FINAL  Final     Catalina Antigua, MD [facility] [place]: [phone]

## 2019-07-15 ENCOUNTER — Inpatient Hospital Stay (HOSPITAL_COMMUNITY): Payer: Self-pay

## 2019-07-15 LAB — GLUCOSE, CAPILLARY
Glucose-Capillary: 194 mg/dL — ABNORMAL HIGH (ref 70–99)
Glucose-Capillary: 228 mg/dL — ABNORMAL HIGH (ref 70–99)
Glucose-Capillary: 223 mg/dL — ABNORMAL HIGH (ref 70–99)
Glucose-Capillary: 235 mg/dL — ABNORMAL HIGH (ref 70–99)
Glucose-Capillary: 149 mg/dL — ABNORMAL HIGH (ref 70–99)

## 2019-07-15 LAB — BASIC METABOLIC PANEL
Anion gap: 8 (ref 5–15)
BUN: <5 mg/dL — ABNORMAL LOW (ref 6–20)
CO2: 32 mmol/L (ref 22–32)
Calcium: 8.1 mg/dL — ABNORMAL LOW (ref 8.9–10.3)
Chloride: 93 mmol/L — ABNORMAL LOW (ref 98–111)
Creatinine, Ser: 0.46 mg/dL — ABNORMAL LOW (ref 0.61–1.24)
GFR calc Af Amer: >60 mL/min (ref >60)
GFR calc non Af Amer: >60 mL/min (ref >60)
Glucose, Bld: 191 mg/dL — ABNORMAL HIGH (ref 70–99)
Potassium: 4.1 mmol/L (ref 3.5–5.1)
Sodium: 133 mmol/L — ABNORMAL LOW (ref 135–145)

## 2019-07-15 LAB — CBC
HCT: 30.5 % — ABNORMAL LOW (ref 39.0–52.0)
Hemoglobin: 9.7 g/dL — ABNORMAL LOW (ref 13.0–17.0)
MCH: 30.1 pg (ref 26.0–34.0)
MCHC: 31.8 g/dL (ref 30.0–36.0)
MCV: 94.7 fL (ref 80.0–100.0)
Platelets: 338 10*3/uL (ref 150–400)
RBC: 3.22 MIL/uL — ABNORMAL LOW (ref 4.22–5.81)
RDW: 13.1 % (ref 11.5–15.5)
WBC: 12.7 10*3/uL — ABNORMAL HIGH (ref 4.0–10.5)
nRBC: 0.2 % (ref 0.0–0.2)

## 2019-07-15 LAB — MAGNESIUM: Magnesium: 1.8 mg/dL (ref 1.7–2.4)

## 2019-07-15 NOTE — Progress Notes (Signed)
PROGRESS NOTE  ATTILIO LANTHIER [id number] DOB: 10-22-1971   PCP: Simona Huh, NP  Patient is from: Home  DOA: 07/05/2019 LOS: 9  Brief Narrative / Interim history: 48 year old male with history of obesity, asthma, hypertension, GERD, nephrolithiasis, diabetes mellitus who presented with low back pain radiating to the right gluteus/thigh ICU with progressive decline in the right lower extremity strength, difficulty ambulation over 2 to 3 weeks.  On presentation he was found to be febrile, tachycardic, tachypneic.  Blood glucose was elevated with increased anion gap.  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  Patient was started on broad-spectrum antibiotics, insulin for DKA.  Neurosurgery, ID consulted.  Patient underwent IR guided his paraspinal abscess drainage.  Blood cultures now growing MSSA.  Currently on cefazolin.  Patient had episode of nausea and vomiting the night of 9/9.  Vomiting was nonbilious.  KUB obtained and revealed marked gaseous dilation of stomach and nonobstructive gas pattern and small and large bowel concerning for ileus. NG tube was placed for decompression.  However, NG tube came out the next morning.  Patient has not had further emesis.  Passing gas but has not has bowel movement.  07/15/2019: Patient seen alongside patient's wife.  Patient continues to break wind, but has not had bowel movement.  Will repeat abdominal KUB.  No nausea or vomiting.  Subjective: No new complaints. No fever or chills.  Objective: Vitals:   07/15/19 0021 07/15/19 0426 07/15/19 0730 07/15/19 1143  BP:  129/78 122/73 129/79  Pulse: 95 96 89 98  Resp: 18  20 20   Temp:  99.6 F (37.6 C) 98 F (36.7 C)   TempSrc:  Oral Oral   SpO2: 95%  99% 100%  Weight:      Height:        Intake/Output Summary (Last 24 hours) at 07/15/2019 1240 Last data filed at 07/15/2019 1100 Gross per  24 hour  Intake 1210 ml  Output 10350 ml  Net -9140 ml   Filed Weights   07/05/19 1807 07/06/19 2116  Weight: (!) 167.4 kg (!) 164 kg    Examination:  GENERAL: No acute distress.  Patient is morbidly obese. HEENT: Pallor.  No jaundice. NECK: Supple.  No apparent JVD.  RESP: Clear to auscultation. CVS: S1-S2. ABD: Morbidly obese, soft and nontender.  Bowel sounds are present, mildly hyperactive NEURO: Awake, alert and oriented appropriately.  Power 0-1 over 5 bilateral lower extremity.   Assessment & Plan: Sepsis/MSSA bacteremia/L3-S1 epidural abscess/right iliopsoas and gluteal abscess/right lower extremity cellulitis -Lower extremity Doppler negative for DVT. -Currently on cefazolin and Flagyl per ID.  Plan is 6 to 8 weeks of IV Ancef.  IV Flagyl to be discontinued once drain come out. -Blood and tissue cultures on 9/4-MSSA.   -Repeat blood culture on 9/7- so far. -TTE and TEE did not show any vegetation.   -Appreciate Ortho trauma input-does not feel repeat I&D is warranted. -Continue pain management with scheduled bowel regimen until he has BM.  May have to consider enema. -PICC line 9/12. -PT/OT evaluation -CIR 07/15/2019: Infectious disease team is directing antibiotics.  Patient will need 8 weeks of antibiotics.  Ileus/constipation/possible gastric outlet obstruction versus possible gastroparesis -Status post NG tube decompression of gastric gas.  -No further emesis.  Passing gases but no BM yet. -Advance diet to regular-this could help with constipation. -Scheduled MiraLAX, Senokot and Reglan.  If no improvement, may consider  suppository with enema. -Mobilize as able which has been difficult. 07/15/2019: Repeat abdominal KUB.  Patient breaks wind, but no bowel movement.  Uncontrolled NIDDM-2/DKA: DKA resolved. Hemoglobin A1c of 12.6.  Hyper and hypoglycemia. -Continue NPH to 23 units twice daily -Continue mealtime at 8 units. -Continue SSI-moderate -Diabetic  coordinator  Hyponatremia: -Discontinued IV fluid. -Resolving.  Sodium today is 133.  AKI:  Resolved with IV hydration  Hypokalemia:  Resolved.  Hypertension:   Normotensive. -Continue hydralazine scheduled and as needed.  GERD: Continue PPI  Fatty liver:  Check lipid profile.   May benefit from statins.    Morbid obesity:  BMI 42.6 Diet and exercise Further management on outpatient basis  DVT prophylaxis: Subcu Lovenox Code Status: Full code Family Communication: Wife Disposition Plan: Remains inpatient pending clinical improvement and clearance by consultants.  Final disposition CIR early next week pending improvement Consultants: Ortho trauma, infectious disease  Procedures:  9/6-incision and drainage 9/9-TEE  Microbiology summarized: 9/4-SARS-CoV-2 screen negative 9/4-blood and tissue cultures-MSSA 9/7-blood cultures negative.  Antimicrobials: Anti-infectives (From admission, onward)   Start     Dose/Rate Route Frequency Ordered Stop   07/10/19 1600  metroNIDAZOLE (FLAGYL) IVPB 500 mg  Status:  Discontinued     500 mg 100 mL/hr over 60 Minutes Intravenous Every 8 hours 07/10/19 1549 07/13/19 0939   07/09/19 0839  vancomycin (VANCOCIN) powder  Status:  Discontinued       As needed 07/09/19 0839 07/09/19 0935   07/09/19 0839  tobramycin (NEBCIN) powder  Status:  Discontinued       As needed 07/09/19 0840 07/09/19 0935   07/07/19 1830  ceFAZolin (ANCEF) IVPB 2g/100 mL premix     2 g 200 mL/hr over 30 Minutes Intravenous Every 8 hours 07/07/19 1333     07/07/19 0900  ceFAZolin (ANCEF) IVPB 2g/100 mL premix  Status:  Discontinued     2 g 200 mL/hr over 30 Minutes Intravenous Every 8 hours 07/07/19 0847 07/07/19 1333   07/06/19 2030  clindamycin (CLEOCIN) IVPB 600 mg  Status:  Discontinued     600 mg 100 mL/hr over 30 Minutes Intravenous Every 8 hours 07/06/19 2010 07/07/19 0847   07/06/19 1800  vancomycin (VANCOCIN) 1,500 mg in sodium chloride 0.9 %  500 mL IVPB  Status:  Discontinued     1,500 mg 250 mL/hr over 120 Minutes Intravenous Every 12 hours 07/06/19 0820 07/07/19 0847   07/06/19 1400  ceFEPIme (MAXIPIME) 2 g in sodium chloride 0.9 % 100 mL IVPB  Status:  Discontinued     2 g 200 mL/hr over 30 Minutes Intravenous Every 8 hours 07/06/19 0504 07/07/19 0847   07/06/19 1400  vancomycin (VANCOCIN) 2,000 mg in sodium chloride 0.9 % 500 mL IVPB  Status:  Discontinued     2,000 mg 250 mL/hr over 120 Minutes Intravenous Every 8 hours 07/06/19 0504 07/06/19 0818   07/06/19 0745  clindamycin (CLEOCIN) IVPB 600 mg  Status:  Discontinued     600 mg 100 mL/hr over 30 Minutes Intravenous  Once 07/06/19 0737 07/06/19 2029   07/06/19 0500  ceFEPIme (MAXIPIME) 2 g in sodium chloride 0.9 % 100 mL IVPB     2 g 200 mL/hr over 30 Minutes Intravenous  Once 07/06/19 0451 07/06/19 1335   07/06/19 0500  vancomycin (VANCOCIN) 2,500 mg in sodium chloride 0.9 % 500 mL IVPB     2,500 mg 250 mL/hr over 120 Minutes Intravenous  Once 07/06/19 0451 07/06/19 1335      Sch  Meds:  Scheduled Meds: . acetaminophen  650 mg Oral Q6H   Or  . acetaminophen  650 mg Rectal Q6H  . Chlorhexidine Gluconate Cloth  6 each Topical Daily  . enoxaparin (LOVENOX) injection  0.5 mg/kg Subcutaneous Q24H  . gabapentin  300 mg Oral TID  . hydrALAZINE  25 mg Oral BID  . insulin aspart  0-15 Units Subcutaneous TID WC  . insulin aspart  0-5 Units Subcutaneous QHS  . insulin aspart  8 Units Subcutaneous TID WC  . insulin NPH Human  23 Units Subcutaneous BID AC & HS  . metoCLOPramide  5 mg Oral TID AC  . polyethylene glycol  17 g Oral BID  . senna-docusate  1 tablet Oral BID  . sodium chloride flush  10-40 mL Intracatheter Q12H  . sodium chloride flush  3 mL Intravenous Q12H   Continuous Infusions: .  ceFAZolin (ANCEF) IV 2 g (07/15/19 1005)   PRN Meds:.albuterol, fentaNYL (SUBLIMAZE) injection, hydrALAZINE, methocarbamol, ondansetron **OR** ondansetron (ZOFRAN) IV,  oxyCODONE, sodium chloride flush, zolpidem   I have personally reviewed the following labs and images: CBC: Recent Labs  Lab 07/09/19 0248 07/10/19 0521 07/12/19 0314 07/13/19 0830 07/15/19 0504  WBC 5.9 8.6 10.0 11.5* 12.7*  HGB 10.4* 10.2* 10.1* 10.4* 9.7*  HCT 31.0* 31.0* 31.1* 33.4* 30.5*  MCV 89.6 90.9 91.7 97.4 94.7  PLT 168 216 334 372 338   BMP &GFR Recent Labs  Lab 07/10/19 0521 07/11/19 0345 07/12/19 0314 07/13/19 0830 07/15/19 0504  NA 127* 128* 131* 131* 133*  K 3.9 3.5 3.4* 4.1 4.1  CL 88* 87* 89* 92* 93*  CO2 27 32 28 29 32  GLUCOSE 312* 247* 187* 141* 191*  BUN 5* 5* <5* <5* <5*  CREATININE 0.69 0.63 0.59* 0.53* 0.46*  CALCIUM 7.9* 8.3* 8.1* 8.1* 8.1*  MG  --   --  1.9 1.9 1.8   Estimated Creatinine Clearance: 188.1 mL/min (A) (by C-G formula based on SCr of 0.46 mg/dL (L)). Liver & Pancreas: No results for input(s): AST, ALT, ALKPHOS, BILITOT, PROT, ALBUMIN in the last 168 hours. No results for input(s): LIPASE, AMYLASE in the last 168 hours. No results for input(s): AMMONIA in the last 168 hours. Diabetic: No results for input(s): HGBA1C in the last 72 hours. Recent Labs  Lab 07/14/19 1233 07/14/19 1656 07/14/19 2124 07/15/19 0728 07/15/19 1146  GLUCAP 143* 128* 210* 194* 228*   Cardiac Enzymes: No results for input(s): CKTOTAL, CKMB, CKMBINDEX, TROPONINI in the last 168 hours. No results for input(s): PROBNP in the last 8760 hours. Coagulation Profile: No results for input(s): INR, PROTIME in the last 168 hours. Thyroid Function Tests: No results for input(s): TSH, T4TOTAL, FREET4, T3FREE, THYROIDAB in the last 72 hours. Lipid Profile: No results for input(s): CHOL, HDL, LDLCALC, TRIG, CHOLHDL, LDLDIRECT in the last 72 hours. Anemia Panel: No results for input(s): VITAMINB12, FOLATE, FERRITIN, TIBC, IRON, RETICCTPCT in the last 72 hours. Urine analysis:    Component Value Date/Time   COLORURINE YELLOW 07/06/2019 [place] 07/06/2019 0437   LABSPEC 1.024 07/06/2019 0437   PHURINE 5.0 07/06/2019 0437   GLUCOSEU >=500 (A) 07/06/2019 0437   HGBUR MODERATE (A) 07/06/2019 0437   BILIRUBINUR NEGATIVE 07/06/2019 0437   KETONESUR 80 (A) 07/06/2019 0437   PROTEINUR NEGATIVE 07/06/2019 0437   NITRITE NEGATIVE 07/06/2019 0437   LEUKOCYTESUR NEGATIVE 07/06/2019 0437   Sepsis Labs: Invalid input(s): PROCALCITONIN, [place]  Microbiology: Recent Results (from the past 240 hour(s))  Blood culture (routine x 2)     Status: Abnormal   Collection Time: 07/06/19  4:30 AM   Specimen: BLOOD  Result Value Ref Range Status   Specimen Description BLOOD SITE NOT SPECIFIED  Final   Special Requests   Final    BOTTLES DRAWN AEROBIC AND ANAEROBIC Blood Culture results may not be optimal due to an inadequate volume of blood received in culture bottles   Culture  Setup Time   Final    IN BOTH AEROBIC AND ANAEROBIC BOTTLES GRAM POSITIVE COCCI Organism ID to follow CRITICAL RESULT CALLED TO, READ BACK BY AND VERIFIED WITHDion Body [facility] 07/06/19 2058 JDW Performed at [facility], [address] [place], [place] [address]    Culture STAPHYLOCOCCUS AUREUS (A)  Final   Report Status 07/08/2019 FINAL  Final   Organism ID, Bacteria STAPHYLOCOCCUS AUREUS  Final      Susceptibility   Staphylococcus aureus - MIC*    CIPROFLOXACIN <=0.5 SENSITIVE Sensitive     ERYTHROMYCIN >=8 RESISTANT Resistant     GENTAMICIN <=0.5 SENSITIVE Sensitive     OXACILLIN <=0.25 SENSITIVE Sensitive     TETRACYCLINE >=16 RESISTANT Resistant     VANCOMYCIN 1 SENSITIVE Sensitive     TRIMETH/SULFA <=10 SENSITIVE Sensitive     CLINDAMYCIN RESISTANT Resistant     RIFAMPIN <=0.5 SENSITIVE Sensitive     Inducible Clindamycin POSITIVE Resistant     * STAPHYLOCOCCUS AUREUS  Blood culture (routine x 2)     Status: Abnormal   Collection Time: 07/06/19  4:30 AM   Specimen: BLOOD  Result Value Ref Range Status   Specimen Description BLOOD SITE  NOT SPECIFIED  Final   Special Requests   Final    BOTTLES DRAWN AEROBIC AND ANAEROBIC Blood Culture adequate volume   Culture  Setup Time   Final    IN BOTH AEROBIC AND ANAEROBIC BOTTLES GRAM POSITIVE COCCI CRITICAL VALUE NOTED.  VALUE IS CONSISTENT WITH PREVIOUSLY REPORTED AND CALLED VALUE.    Culture (A)  Final    STAPHYLOCOCCUS AUREUS SUSCEPTIBILITIES PERFORMED ON PREVIOUS CULTURE WITHIN THE LAST 5 DAYS. Performed at [facility], [place] [address] [place], [place] [address]    Report Status 07/08/2019 FINAL  Final  Blood Culture ID Panel (Reflexed)     Status: Abnormal   Collection Time: 07/06/19  4:30 AM  Result Value Ref Range Status   Enterococcus species NOT DETECTED NOT DETECTED Final   Listeria monocytogenes NOT DETECTED NOT DETECTED Final   Staphylococcus species DETECTED (A) NOT DETECTED Final    Comment: CRITICAL RESULT CALLED TO, READ BACK BY AND VERIFIED WITH: J MILLEN PHARMD 07/06/19 2058 JDW    Staphylococcus aureus (BCID) DETECTED (A) NOT DETECTED Final    Comment: Methicillin (oxacillin) susceptible Staphylococcus aureus (MSSA). Preferred therapy is anti staphylococcal beta lactam antibiotic (Cefazolin or Nafcillin), unless clinically contraindicated. CRITICAL RESULT CALLED TO, READ BACK BY AND VERIFIED WITH: Dion Body [facility] 07/06/19 2058 JDW    Methicillin resistance NOT DETECTED NOT DETECTED Final   Streptococcus species NOT DETECTED NOT DETECTED Final   Streptococcus agalactiae NOT DETECTED NOT DETECTED Final   Streptococcus pneumoniae NOT DETECTED NOT DETECTED Final   Streptococcus pyogenes NOT DETECTED NOT DETECTED Final   Acinetobacter baumannii NOT DETECTED NOT DETECTED Final   Enterobacteriaceae species NOT DETECTED NOT DETECTED Final   Enterobacter cloacae complex NOT DETECTED NOT DETECTED Final   Escherichia coli NOT DETECTED NOT DETECTED Final   Klebsiella oxytoca NOT DETECTED  NOT DETECTED Final   Klebsiella pneumoniae NOT DETECTED NOT DETECTED  Final   Proteus species NOT DETECTED NOT DETECTED Final   Serratia marcescens NOT DETECTED NOT DETECTED Final   Haemophilus influenzae NOT DETECTED NOT DETECTED Final   Neisseria meningitidis NOT DETECTED NOT DETECTED Final   Pseudomonas aeruginosa NOT DETECTED NOT DETECTED Final   Candida albicans NOT DETECTED NOT DETECTED Final   Candida glabrata NOT DETECTED NOT DETECTED Final   Candida krusei NOT DETECTED NOT DETECTED Final   Candida parapsilosis NOT DETECTED NOT DETECTED Final   Candida tropicalis NOT DETECTED NOT DETECTED Final    Comment: Performed at [facility], [place] [address], [place], [place] [address]  Urine culture     Status: Abnormal   Collection Time: 07/06/19  4:37 AM   Specimen: Urine, Random  Result Value Ref Range Status   Specimen Description URINE, RANDOM  Final   Special Requests   Final    NONE Performed at [facility], [place] [address] [place], [place] [address]    Culture (A)  Final    >=100,000 COLONIES/mL MULTIPLE SPECIES PRESENT, SUGGEST RECOLLECTION   Report Status 07/07/2019 FINAL  Final  SARS Coronavirus 2 [facility] order, Performed in [facility] hospital lab) Nasopharyngeal Nasopharyngeal Swab     Status: None   Collection Time: 07/06/19  8:04 AM   Specimen: Nasopharyngeal Swab  Result Value Ref Range Status   SARS Coronavirus 2 NEGATIVE NEGATIVE Final    Comment: (NOTE) If result is NEGATIVE SARS-CoV-2 target nucleic acids are NOT DETECTED. The SARS-CoV-2 RNA is generally detectable in upper and lower  respiratory specimens during the acute phase of infection. The lowest  concentration of SARS-CoV-2 viral copies this assay can detect is 250  copies / mL. A negative result does not preclude SARS-CoV-2 infection  and should not be used as the sole basis for treatment or other  patient management decisions.  A negative result may occur with  improper specimen collection / handling, submission of specimen other  than nasopharyngeal  swab, presence of viral mutation(s) within the  areas targeted by this assay, and inadequate number of viral copies  (<250 copies / mL). A negative result must be combined with clinical  observations, patient history, and epidemiological information. If result is POSITIVE SARS-CoV-2 target nucleic acids are DETECTED. The SARS-CoV-2 RNA is generally detectable in upper and lower  respiratory specimens dur ing the acute phase of infection.  Positive  results are indicative of active infection with SARS-CoV-2.  Clinical  correlation with patient history and other diagnostic information is  necessary to determine patient infection status.  Positive results do  not rule out bacterial infection or co-infection with other viruses. If result is PRESUMPTIVE POSTIVE SARS-CoV-2 nucleic acids MAY BE PRESENT.   A presumptive positive result was obtained on the submitted specimen  and confirmed on repeat testing.  While 2019 novel coronavirus  (SARS-CoV-2) nucleic acids may be present in the submitted sample  additional confirmatory testing may be necessary for epidemiological  and / or clinical management purposes  to differentiate between  SARS-CoV-2 and other Sarbecovirus currently known to infect humans.  If clinically indicated additional testing with an alternate test  methodology [phone]) is advised. The SARS-CoV-2 RNA is generally  detectable in upper and lower respiratory sp ecimens during the acute  phase of infection. The expected result is Negative. Fact Sheet for Patients:  StrictlyIdeas.no Fact Sheet for Healthcare Providers: BankingDealers.co.za This test is not yet  approved or cleared by the [place] and has been authorized for detection and/or diagnosis of SARS-CoV-2 by FDA under an Emergency Use Authorization (EUA).  This EUA will remain in effect (meaning this test can be used) for the duration of the COVID-19 declaration  under Section 564(b)(1) of the Act, 21 U.S.C. section 360bbb-3(b)(1), unless the authorization is terminated or revoked sooner. Performed at [facility], [place] [address] [place], [place] [address]   Culture, fungus without smear     Status: None (Preliminary result)   Collection Time: 07/06/19  6:48 PM   Specimen: Abscess; Other  Result Value Ref Range Status   Specimen Description ABSCESS PARASPINAL  Final   Special Requests NONE  Final   Culture   Final    NO FUNGUS ISOLATED AFTER 7 DAYS Performed at [facility], [address], [place], [place] [address]    Report Status PENDING  Incomplete  Aerobic/Anaerobic Culture (surgical/deep wound)     Status: None   Collection Time: 07/06/19  6:48 PM   Specimen: Abscess  Result Value Ref Range Status   Specimen Description ABSCESS  Final   Special Requests PARASPINAL  Final   Gram Stain   Final    FEW WBC PRESENT, PREDOMINANTLY PMN ABUNDANT GRAM POSITIVE COCCI    Culture   Final    ABUNDANT STAPHYLOCOCCUS AUREUS NO ANAEROBES ISOLATED Performed at [facility], [place] [address] [place], [place] [address]    Report Status 07/12/2019 FINAL  Final   Organism ID, Bacteria STAPHYLOCOCCUS AUREUS  Final      Susceptibility   Staphylococcus aureus - MIC*    CIPROFLOXACIN <=0.5 SENSITIVE Sensitive     ERYTHROMYCIN >=8 RESISTANT Resistant     GENTAMICIN <=0.5 SENSITIVE Sensitive     OXACILLIN <=0.25 SENSITIVE Sensitive     TETRACYCLINE >=16 RESISTANT Resistant     VANCOMYCIN 1 SENSITIVE Sensitive     TRIMETH/SULFA <=10 SENSITIVE Sensitive     CLINDAMYCIN RESISTANT Resistant     RIFAMPIN <=0.5 SENSITIVE Sensitive     Inducible Clindamycin POSITIVE Resistant     * ABUNDANT STAPHYLOCOCCUS AUREUS  Aerobic/Anaerobic Culture (surgical/deep wound)     Status: None   Collection Time: 07/09/19  8:32 AM   Specimen: Abscess  Result Value Ref Range Status   Specimen Description ABSCESS HIP RIGHT  Final   Special Requests  NONE  Final   Gram Stain   Final    MODERATE WBC PRESENT,BOTH PMN AND MONONUCLEAR ABUNDANT GRAM POSITIVE COCCI IN PAIRS IN CLUSTERS    Culture   Final    FEW STAPHYLOCOCCUS AUREUS NO ANAEROBES ISOLATED Performed at [facility], [place] [address] [place], [place] [address]    Report Status 07/14/2019 FINAL  Final   Organism ID, Bacteria STAPHYLOCOCCUS AUREUS  Final      Susceptibility   Staphylococcus aureus - MIC*    CIPROFLOXACIN <=0.5 SENSITIVE Sensitive     ERYTHROMYCIN >=8 RESISTANT Resistant     GENTAMICIN <=0.5 SENSITIVE Sensitive     OXACILLIN <=0.25 SENSITIVE Sensitive     TETRACYCLINE >=16 RESISTANT Resistant     VANCOMYCIN 1 SENSITIVE Sensitive     TRIMETH/SULFA <=10 SENSITIVE Sensitive     CLINDAMYCIN RESISTANT Resistant     RIFAMPIN <=0.5 SENSITIVE Sensitive     Inducible Clindamycin POSITIVE Resistant     * FEW STAPHYLOCOCCUS AUREUS  Culture, blood (routine x 2)     Status: None   Collection Time:  07/09/19 12:57 PM   Specimen: BLOOD RIGHT HAND  Result Value Ref Range Status   Specimen Description BLOOD RIGHT HAND  Final   Special Requests   Final    AEROBIC BOTTLE ONLY Blood Culture results may not be optimal due to an inadequate volume of blood received in culture bottles   Culture   Final    NO GROWTH 5 DAYS Performed at [facility], [place] [address], [place], [place] [address]    Report Status 07/14/2019 FINAL  Final  Culture, blood (routine x 2)     Status: None   Collection Time: 07/09/19  1:06 PM   Specimen: BLOOD RIGHT HAND  Result Value Ref Range Status   Specimen Description BLOOD RIGHT HAND  Final   Special Requests AEROBIC BOTTLE ONLY Blood Culture adequate volume  Final   Culture   Final    NO GROWTH 5 DAYS Performed at [facility], [place] [address] [place], [place] [address]    Report Status 07/14/2019 FINAL  Final    Radiology Studies: No results found.  Bonnell Public, M.D. [facility]  If 7PM-7AM, please  contact night-coverage [email or web address] Password [facility] 07/15/2019, 12:40 PM

## 2019-07-16 DIAGNOSIS — Z95828 Presence of other vascular implants and grafts: Secondary | ICD-10-CM

## 2019-07-16 DIAGNOSIS — L0231 Cutaneous abscess of buttock: Secondary | ICD-10-CM

## 2019-07-16 LAB — GLUCOSE, CAPILLARY
Glucose-Capillary: 217 mg/dL — ABNORMAL HIGH (ref 70–99)
Glucose-Capillary: 174 mg/dL — ABNORMAL HIGH (ref 70–99)
Glucose-Capillary: 138 mg/dL — ABNORMAL HIGH (ref 70–99)
Glucose-Capillary: 179 mg/dL — ABNORMAL HIGH (ref 70–99)

## 2019-07-16 MED ORDER — METOCLOPRAMIDE HCL 5 MG/5ML PO SOLN
10.0000 mg | Freq: Three times a day (TID) | ORAL | Status: DC
  Administered 2019-07-16 – 2019-08-16 (×112): 10 mg via ORAL
  Filled 2019-07-16 (×124): qty 10

## 2019-07-16 NOTE — Progress Notes (Signed)
Inpatient Diabetes Program Recommendations  AACE/ADA: New Consensus Statement on Inpatient Glycemic Control (2015)  Target Ranges:  Prepandial:   less than 140 mg/dL      Peak postprandial:   less than 180 mg/dL (1-2 hours)      Critically ill patients:  140 - 180 mg/dL   Lab Results  Component Value Date   GLUCAP 217 (H) 07/16/2019   HGBA1C 12.6 (H) 07/07/2019    Review of Glycemic Control Results for Bradley Cox, Bradley Cox (MRN [id number]) as of 07/16/2019 11:50  Ref. Range 07/15/2019 07:28 07/15/2019 11:46 07/15/2019 16:01 07/15/2019 17:56 07/15/2019 21:36 07/16/2019 07:33  Glucose-Capillary Latest Ref Range: 70 - 99 mg/dL 194 (H) 228 (H) 223 (H) 235 (H) 149 (H) 217 (H)   Diabetes history: DM 2 Outpatient Diabetes medications: Metformin 500 mg bid Current orders for Inpatient glycemic control:  NPH 23 units bid Novolog 0-15 units tid Novolog 0-5 units qhs Novolog 8 units tid meal coverage  Inpatient Diabetes Program Recommendations:    Consider increasing NPH to 26 units bid.  Thanks,  Tama Headings RN, MSN, BC-ADM Inpatient Diabetes Coordinator Team Pager [phone] (8a-5p)

## 2019-07-16 NOTE — Consult Note (Signed)
[facility] Nurse wound consult note Patient receiving care in [place]. Reason for Consult: "wound to L outer thigh with excessive drainage" Wound type: surgical I spoke with primary RN, Varney Biles via telephone.  She explained that she meant to put in the location as the RIGHT leg.  When speaking with her about the wound, she explained that it is a closed incision that is leaking a lot of secretions.  While I had her on the phone, I was able to locate that Dr. Lennette Bihari Haddix did the orthopedic surgery on 07/09/19 and instructed her that surgery needs to be directing care on this incision.  WOC consult is being completed and care is under the direction of Orthopedic surgery. Val Riles, RN, MSN, CWOCN, CNS-BC, pager [phone]

## 2019-07-16 NOTE — Progress Notes (Signed)
Physical Therapy Treatment Patient Details Name: Bradley Cox MRN: [id number] DOB: [birth year] Today's Date: 07/16/2019    History of Present Illness Patient is a 48 y/o male who presents with right flank pain. Admitted with disseminated MSSA infection including bacteremia, Lumbosacral spinal epidural abscess, paraspinal/large gluteal/pelvic abscess with sciatica impingement. s/p I&D large gluteal abscess and neuroplasty of sciatica nerve 9/7. s/p CT aspiration right paraspinal abscess. PMH includes DM, HTN, morbid obesity, OSA.    PT Comments    Patient not progressing well today secondary to feeling lightheaded, dizzy and not well once sitting EOB. Increased time to perform all mobility. Min guard assist to get to EOB with increased effort and time. Bp stable. Sp02 difficult to get a great wave form/reading but looked to be in mid 80s on RA, donned 2L/min 02 and 02 returned to high 90s. RN made aware. Per RN, pt walked to bathroom this AM with RW. Mother present during session. Will continue to follow.    Follow Up Recommendations  CIR;Supervision for mobility/OOB;Supervision/Assistance - 24 hour     Equipment Recommendations  Rolling walker with 5" wheels;3in1 (PT)    Recommendations for Other Services       Precautions / Restrictions Precautions Precautions: Fall Restrictions Weight Bearing Restrictions: Yes RLE Weight Bearing: Weight bearing as tolerated    Mobility  Bed Mobility Overal bed mobility: Needs Assistance Bed Mobility: Supine to Sit     Supine to sit: Min guard;HOB elevated Sit to supine: Mod assist;+2 for physical assistance   General bed mobility comments: Increased time to bring RLE to EOB and elevate trunk. + lightheadedness, using BUEs propping up for support. Reports seeing 3 of me. Visibly looks dizzy with some sway. Assist to bring LEs into bed and for trunk to return to supine.  Transfers                 General transfer comment: Deferred  secondary to feeling like he was going to pass out and not feeling well.  Ambulation/Gait             General Gait Details: Deferred   Stairs             Wheelchair Mobility    Modified Rankin (Stroke Patients Only)       Balance Overall balance assessment: Needs assistance Sitting-balance support: Feet supported;Bilateral upper extremity supported Sitting balance-Leahy Scale: Poor Sitting balance - Comments: Requires BUE support as position of comfort. Offloading right hip. Ligheaded. BP stable. Sp02 not reading well, likely dropped to mid 80s on RA, donned 02. Postural control: Posterior lean                                  Cognition Arousal/Alertness: Awake/alert Behavior During Therapy: WFL for tasks assessed/performed Overall Cognitive Status: Within Functional Limits for tasks assessed                                 General Comments: Internally distracted by wanting water and pain.      Exercises      General Comments General comments (skin integrity, edema, etc.): BP stable throughout. Sp02 dropped to mid 80s but not great wave form on RA. Donned supplemental 02 post session and 02 in high 90s.      Pertinent Vitals/Pain Pain Assessment: Faces Faces Pain Scale: Hurts whole lot Pain Location: RLE  Pain Descriptors / Indicators: Constant;Discomfort;Grimacing Pain Intervention(s): Limited activity within patient's tolerance;Monitored during session;Repositioned;Premedicated before session    Home Living                      Prior Function            PT Goals (current goals can now be found in the care plan section) Progress towards PT goals: Not progressing toward goals - comment(lightheadedness, feeling bad)    Frequency    Min 3X/week      PT Plan Current plan remains appropriate    Co-evaluation              AM-PAC PT "6 Clicks" Mobility   Outcome Measure  Help needed turning from your  back to your side while in a flat bed without using bedrails?: A Little Help needed moving from lying on your back to sitting on the side of a flat bed without using bedrails?: A Little Help needed moving to and from a bed to a chair (including a wheelchair)?: A Lot Help needed standing up from a chair using your arms (e.g., wheelchair or bedside chair)?: A Lot Help needed to walk in hospital room?: A Lot Help needed climbing 3-5 steps with a railing? : Total 6 Click Score: 13    End of Session Equipment Utilized During Treatment: Oxygen Activity Tolerance: Treatment limited secondary to medical complications (Comment)(feeling bad, lightheadedness) Patient left: in bed;with call bell/phone within reach;with bed alarm set;with family/visitor present Nurse Communication: Mobility status;Other (comment)(lightheadedness) PT Visit Diagnosis: Muscle weakness (generalized) (M62.81);Pain;Unsteadiness on feet (R26.81);Difficulty in walking, not elsewhere classified (R26.2) Pain - Right/Left: Right Pain - part of body: Leg     Time: [id number] PT Time Calculation (min) (ACUTE ONLY): 26 min  Charges:  $Therapeutic Activity: 23-37 mins                     Wray Kearns, [place], DPT Acute Rehabilitation Services Pager [phone] Office [phone]       Marguarite Arbour A Sabra Heck 07/16/2019, 12:14 PM

## 2019-07-16 NOTE — Progress Notes (Signed)
Ortho Trauma Progress Note  Nursing noted some increased drainage from right hip incision that started overnight. Denies fever, chills, nausea, vomiting. Continues with IV antibiotics per ID team.  Right lower extremity with some swelling and with a line drawn around an area of redness. The redness has retracted from when the line was originally drawn. Incision with some drainage from top of incision. Drainage does not look comparable to the pus that was appreciated in the operating room last week.  He has active dorsiflexion plantarflexion of his ankle.  Some improvement in movement of right lower extremity noted. 2+ DP pulse  Results for orders placed or performed during the hospital encounter of 07/05/19 (from the past 24 hour(s))  Glucose, capillary     Status: Abnormal   Collection Time: 07/15/19  9:36 PM  Result Value Ref Range   Glucose-Capillary 149 (H) 70 - 99 mg/dL  Glucose, capillary     Status: Abnormal   Collection Time: 07/16/19  7:33 AM  Result Value Ref Range   Glucose-Capillary 217 (H) 70 - 99 mg/dL  Glucose, capillary     Status: Abnormal   Collection Time: 07/16/19 12:02 PM  Result Value Ref Range   Glucose-Capillary 174 (H) 70 - 99 mg/dL   Comment 1 Notify RN    Comment 2 Document in Chart   Glucose, capillary     Status: Abnormal   Collection Time: 07/16/19  5:19 PM  Result Value Ref Range   Glucose-Capillary 138 (H) 70 - 99 mg/dL    Assessment and plan: 48 year old male with multifocal abscesses status post I&D right gluteal abscess with extension into the pelvis on 07/09/2019.  Patient had a significantly large abscess and will have significant pain for a number of weeks.  The current drainage does not look overtly purulent compared to what it was intraoperatively. Continue with current IV antibiotics. Will re-evaluate area tomorrow morning to determine need for repeat I&D versus wound vac placement.    Aleya Durnell A. [facility] ?[phone]? (phone)

## 2019-07-16 NOTE — Progress Notes (Signed)
While performing routine regular assessment on patient, the right hip sutures were noted to be oozing and draining of serosum. Site was cleaned, assessed and dressing applied. Patient was up, and out of bed,and with walk as an assistive device, was able to walk to the bathroom, used the bathroom commode. Patient had a large bowel movement

## 2019-07-16 NOTE — Progress Notes (Signed)
[facility]  Date of Admission:  07/05/2019     Total days of antibiotics 11         ASSESSMENT/PLAN  Bradley Cox has been afebrile and currently on Day 11 of Cefazolin for paraspinal and gluteal abscesses s/p I&D of the right hip abscess complicated by MSSA bacteremia. 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 Nursing noted some continued oozing from the wound with no plans for repeat I&D at present. Will need at least 4 weeks of IV therapy with Cefazolin with end date tentatively set for 08/06/19 and follow up in ID office to determine additional duration of treatment if needed.  1. Continue Cefazolin.  2. OPAT orders previously placed. 3. Continue wound care per orthopedics. 4. Follow up in ID office prior to completion of therapy.  ID will sign off and be available as needed for remainder of hospitalization.   Diagnosis: Disseminated MSSA infection with bacteremia and multiple abscess  Culture Result: MSSA  Allergies  Allergen Reactions   Penicillins Anaphylaxis    Did it involve swelling of the face/tongue/throat, SOB, or low BP? Yes Did it involve sudden or severe rash/hives, skin peeling, or any reaction on the inside of your mouth or nose? No Did you need to seek medical attention at a hospital or doctor's office? Yes When did it last happen?4 months old If all above answers are NO, may proceed with cephalosporin use.    Tramadol Anaphylaxis    OPAT Orders Discharge antibiotics: Cefazolin Per pharmacy protocol   Duration: 4 weeks  End Date: 08/06/19  [facility] Care Per Protocol:  Labs weekly while on IV antibiotics: _X_ CBC with differential _X_ BMP __ CMP __ CRP __ ESR __ Vancomycin trough __ CK  __ Please pull PIC at completion of IV antibiotics _X_ Please leave PIC in place until doctor  has seen patient or been notified  Fax weekly labs to [phone]  Clinic Follow Up Appt:  Dr. Prince Rome on 08/02/19 at 9:30am    Principal Problem:   MSSA bacteremia Active Problems:   Obesity, Class III, BMI 40-49.9 (morbid obesity) ([place])   Essential hypertension   Sepsis ([employer])   AKI (acute kidney injury) ([place])   Hyperkalemia   Abscess of right hip   Paraspinal abscess ([facility])   Compression of right sciatic nerve    acetaminophen  650 mg Oral Q6H   Or   acetaminophen  650 mg Rectal Q6H   Chlorhexidine Gluconate Cloth  6 each Topical Daily   enoxaparin (LOVENOX) injection  0.5 mg/kg Subcutaneous Q24H   gabapentin  300 mg Oral TID   hydrALAZINE  25 mg Oral BID   insulin aspart  0-15 Units Subcutaneous TID WC   insulin aspart  0-5 Units Subcutaneous QHS   insulin aspart  8 Units Subcutaneous TID WC   insulin NPH Human  23 Units Subcutaneous BID AC & HS   metoCLOPramide  5 mg Oral TID AC   polyethylene glycol  17 g Oral BID   senna-docusate  1 tablet Oral BID   sodium chloride flush  10-40 mL Intracatheter Q12H   sodium chloride flush  3 mL Intravenous Q12H    SUBJECTIVE:  Afebrile overnight with stable WBC count. Had a bowel movement today. Continues to have clear drainage from his hip wound and his right knee is wobbly when Bradley Cox stands.  Allergies  Allergen Reactions   Penicillins Anaphylaxis    Did it involve swelling of the face/tongue/throat, SOB, or low BP? Yes Did it involve sudden or severe rash/hives, skin peeling, or any reaction on the inside of your mouth or nose? No Did you need to seek medical attention at a hospital or doctor's office? Yes When did it last happen?4 months old If all above answers are NO, may proceed with cephalosporin use.    Tramadol Anaphylaxis     Review of Systems: Review of Systems  Constitutional: Negative for chills, fever and weight loss.  Respiratory: Negative for cough, shortness of breath and  wheezing.   Cardiovascular: Negative for chest pain and leg swelling.  Gastrointestinal: Negative for abdominal pain, constipation, diarrhea, nausea and vomiting.  Skin: Negative for rash.      OBJECTIVE: Vitals:   07/15/19 2137 07/15/19 2341 07/16/19 0335 07/16/19 0732  BP: 140/81 127/72 133/78 122/79  Pulse:    96  Resp:    20  Temp:  98.2 F (36.8 C) 98.7 F (37.1 C) 98.2 F (36.8 C)  TempSrc:  Oral Axillary Oral  SpO2:   100% 99%  Weight:      Height:       Body mass index is 41.78 kg/m.  Physical Exam Constitutional:      General: Bradley Cox is not in acute distress.    Appearance: Bradley Cox is well-developed.     Comments: Lying in bed with head of bed elevated; pleasant.   Cardiovascular:     Rate and Rhythm: Normal rate and regular rhythm.     Heart sounds: Normal heart sounds.     Comments: PICC line in right upper extremity with dressing that is clean and dry; site appears without evidence of infection.  Pulmonary:     Effort: Pulmonary effort is normal.     Breath sounds: Normal breath sounds.  Abdominal:     General: Bowel sounds are normal. There is no distension.     Tenderness: There is no abdominal tenderness. There is no guarding or rebound.  Musculoskeletal:     Comments: Right hip dressing is clean and dry.   Skin:    General: Skin is warm and dry.  Neurological:     Mental Status: Bradley Cox is alert and oriented to person, place, and time.  Psychiatric:        Behavior: Behavior normal.        Thought Content: Thought content normal.        Judgment: Judgment normal.     Lab Results Lab Results  Component Value Date   WBC 12.7 (H) 07/15/2019   HGB 9.7 (L) 07/15/2019   HCT 30.5 (L) 07/15/2019   MCV 94.7 07/15/2019   PLT 338 07/15/2019    Lab Results  Component Value Date   CREATININE 0.46 (L) 07/15/2019   BUN <5 (L) 07/15/2019   NA 133 (L) 07/15/2019   K 4.1 07/15/2019   CL 93 (L) 07/15/2019   CO2 32 07/15/2019    Lab Results  Component Value Date    ALT 19 07/06/2019   AST 25 07/06/2019   ALKPHOS 84 07/06/2019   BILITOT 1.0 07/06/2019     Microbiology: Recent Results (from the past 240 hour(s))  Culture, fungus without smear     Status: None (Preliminary result)   Collection Time: 07/06/19  6:48 PM   Specimen: Abscess; Other  Result Value Ref Range Status   Specimen Description ABSCESS PARASPINAL  Final  Special Requests NONE  Final   Culture   Final    NO FUNGUS ISOLATED AFTER 7 DAYS Performed at [facility], [place] [address], [place], [place] [address]    Report Status PENDING  Incomplete  Aerobic/Anaerobic Culture (surgical/deep wound)     Status: None   Collection Time: 07/06/19  6:48 PM   Specimen: Abscess  Result Value Ref Range Status   Specimen Description ABSCESS  Final   Special Requests PARASPINAL  Final   Gram Stain   Final    FEW WBC PRESENT, PREDOMINANTLY PMN ABUNDANT GRAM POSITIVE COCCI    Culture   Final    ABUNDANT STAPHYLOCOCCUS AUREUS NO ANAEROBES ISOLATED Performed at [facility], [place] [address], [place], [place] [address]    Report Status 07/12/2019 FINAL  Final   Organism ID, Bacteria STAPHYLOCOCCUS AUREUS  Final      Susceptibility   Staphylococcus aureus - MIC*    CIPROFLOXACIN <=0.5 SENSITIVE Sensitive     ERYTHROMYCIN >=8 RESISTANT Resistant     GENTAMICIN <=0.5 SENSITIVE Sensitive     OXACILLIN <=0.25 SENSITIVE Sensitive     TETRACYCLINE >=16 RESISTANT Resistant     VANCOMYCIN 1 SENSITIVE Sensitive     TRIMETH/SULFA <=10 SENSITIVE Sensitive     CLINDAMYCIN RESISTANT Resistant     RIFAMPIN <=0.5 SENSITIVE Sensitive     Inducible Clindamycin POSITIVE Resistant     * ABUNDANT STAPHYLOCOCCUS AUREUS  Aerobic/Anaerobic Culture (surgical/deep wound)     Status: None   Collection Time: 07/09/19  8:32 AM   Specimen: Abscess  Result Value Ref Range Status   Specimen Description ABSCESS HIP RIGHT  Final   Special Requests NONE  Final   Gram Stain   Final    MODERATE WBC  PRESENT,BOTH PMN AND MONONUCLEAR ABUNDANT GRAM POSITIVE COCCI IN PAIRS IN CLUSTERS    Culture   Final    FEW STAPHYLOCOCCUS AUREUS NO ANAEROBES ISOLATED Performed at [facility], [place] [address] [place], [place] [address]    Report Status 07/14/2019 FINAL  Final   Organism ID, Bacteria STAPHYLOCOCCUS AUREUS  Final      Susceptibility   Staphylococcus aureus - MIC*    CIPROFLOXACIN <=0.5 SENSITIVE Sensitive     ERYTHROMYCIN >=8 RESISTANT Resistant     GENTAMICIN <=0.5 SENSITIVE Sensitive     OXACILLIN <=0.25 SENSITIVE Sensitive     TETRACYCLINE >=16 RESISTANT Resistant     VANCOMYCIN 1 SENSITIVE Sensitive     TRIMETH/SULFA <=10 SENSITIVE Sensitive     CLINDAMYCIN RESISTANT Resistant     RIFAMPIN <=0.5 SENSITIVE Sensitive     Inducible Clindamycin POSITIVE Resistant     * FEW STAPHYLOCOCCUS AUREUS  Culture, blood (routine x 2)     Status: None   Collection Time: 07/09/19 12:57 PM   Specimen: BLOOD RIGHT HAND  Result Value Ref Range Status   Specimen Description BLOOD RIGHT HAND  Final   Special Requests   Final    AEROBIC BOTTLE ONLY Blood Culture results may not be optimal due to an inadequate volume of blood received in culture bottles   Culture   Final    NO GROWTH 5 DAYS Performed at [facility], [place] [address], [place],  [address]    Report Status 07/14/2019 FINAL  Final  Culture, blood (routine x 2)     Status: None   Collection Time: 07/09/19  1:06 PM   Specimen: BLOOD RIGHT HAND  Result Value Ref Range  Status   Specimen Description BLOOD RIGHT HAND  Final   Special Requests AEROBIC BOTTLE ONLY Blood Culture adequate volume  Final   Culture   Final    NO GROWTH 5 DAYS Performed at [facility], [address], [place], [place] [address]    Report Status 07/14/2019 FINAL  Final     Terri Piedra, NP [facility] [phone] Pager  07/16/2019  10:35 AM

## 2019-07-16 NOTE — Progress Notes (Signed)
PROGRESS NOTE  Bradley Cox [id number] DOB: Jul 26, 1971   PCP: Simona Huh, NP  Patient is from: Home  DOA: 07/05/2019 LOS: 73  Brief Narrative / Interim history: 48 year old male with history of obesity, asthma, hypertension, GERD, nephrolithiasis, diabetes mellitus who presented with low back pain radiating to the right gluteus/thigh ICU with progressive decline in the right lower extremity strength, difficulty ambulation over 2 to 3 weeks.  On presentation he was found to be febrile, tachycardic, tachypneic.  Blood glucose was elevated with increased anion gap.  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  Patient was started on broad-spectrum antibiotics, insulin for DKA.  Neurosurgery, ID consulted.  Patient underwent IR guided his paraspinal abscess drainage.  Blood cultures now growing MSSA.  Currently on cefazolin.  Patient had episode of nausea and vomiting the night of 9/9.  Vomiting was nonbilious.  KUB obtained and revealed marked gaseous dilation of stomach and nonobstructive gas pattern and small and large bowel concerning for ileus. NG tube was placed for decompression.  However, NG tube came out the next morning.  Patient has not had further emesis.  Passing gas but has not has bowel movement.  07/16/2019: Patient seen alongside patient's wife.  Patient has had bowel movement.  Repeat abdominal x-ray report and film reviewed.  Will increase Reglan from 5 Mg p.o. 3 times daily to 10 mg p.o. 3 times daily before meals and at bedtime.  Patient right hip/pelvic wound that was incised and drained on 07/09/2019 by Dr. Katha Hamming (orthopedic team) is oozing significantly.  I have recontacted Dr. Doreatha Martin, and orthopedic team will review patient as well for possible further surgery and wound VAC placement.  Some improvement in lower extremity movement is noted.    Subjective: No new  complaints. No fever or chills. Patient has had bowel movement.  Objective: Vitals:   07/15/19 2341 07/16/19 0335 07/16/19 0732 07/16/19 1203  BP: 127/72 133/78 122/79 121/74  Pulse:   96 (!) 103  Resp:   20 18  Temp: 98.2 F (36.8 C) 98.7 F (37.1 C) 98.2 F (36.8 C) 98 F (36.7 C)  TempSrc: Oral Axillary Oral Oral  SpO2:  100% 99% 97%  Weight:      Height:        Intake/Output Summary (Last 24 hours) at 07/16/2019 1627 Last data filed at 07/16/2019 1300 Gross per 24 hour  Intake 613 ml  Output 7951 ml  Net -7338 ml   Filed Weights   07/05/19 1807 07/06/19 2116  Weight: (!) 167.4 kg (!) 164 kg    Examination:  GENERAL: No acute distress.  Patient is morbidly obese. HEENT: Pallor.  No jaundice. NECK: Supple.  No apparent JVD.  RESP: Clear to auscultation. CVS: S1-S2. ABD: Morbidly obese, soft and nontender.  Bowel sounds are present, mildly hyperactive NEURO: Awake, alert and oriented appropriately.  Power improved to 1/5 bilateral lower extremity.   Assessment & Plan: Sepsis/MSSA bacteremia/L3-S1 epidural abscess/right iliopsoas and gluteal abscess/right lower extremity cellulitis -Lower extremity Doppler negative for DVT. -Currently on cefazolin and Flagyl per ID.  Plan is 6 to 8 weeks of IV Ancef.  IV Flagyl to be discontinued once drain come out. -Blood and tissue cultures on 9/4-MSSA.   -Repeat blood culture on 9/7- so far. -TTE and TEE did not show any vegetation.   -Appreciate Ortho trauma input-does not feel repeat I&D is warranted. -Continue pain management  with scheduled bowel regimen until he has BM.  May have to consider enema. -PICC line 9/12. -PT/OT evaluation -CIR 07/16/2019: Infectious disease team is directing antibiotics.  Patient will need 8 weeks of antibiotics.  Ileus/constipation/possible gastric outlet obstruction versus possible gastroparesis -Status post NG tube decompression of gastric gas.  -No further emesis.  Passing gases but no BM  yet. -Advance diet to regular-this could help with constipation. -Scheduled MiraLAX, Senokot and Reglan.  If no improvement, may consider suppository with enema. -Mobilize as able which has been difficult. 07/16/2019: Repeat abdominal KUB results noted.  Patient has had bowel movement.  Will increase Reglan to 10 Mg p.o. 3 times daily before meals and at bedtime.  Uncontrolled NIDDM-2/DKA: DKA resolved. Hemoglobin A1c of 12.6.  Hyper and hypoglycemia. -Continue NPH to 23 units twice daily -Continue mealtime at 8 units. -Continue SSI-moderate -Diabetic coordinator  Hyponatremia: -Discontinued IV fluid. -Resolving.  Sodium today is 133.  AKI:  Resolved with IV hydration  Hypokalemia:  Resolved.  Hypertension:   Normotensive. -Continue hydralazine scheduled and as needed.  GERD: Continue PPI  Fatty liver:  Check lipid profile.   May benefit from statins.    Morbid obesity:  BMI 42.6 Diet and exercise Further management on outpatient basis  Right hip/pelvic abscess status post I&D on 07/09/2019: Wound is draining significantly. Recontacted Dr. Katha Hamming with orthopedic team and they will see patient.    DVT prophylaxis: Subcu Lovenox Code Status: Full code Family Communication: Wife Disposition Plan: Remains inpatient pending clinical improvement and clearance by consultants.  Final disposition CIR early next week pending improvement Consultants: Ortho trauma, infectious disease  Procedures:  9/6-incision and drainage 9/9-TEE  Microbiology summarized: 9/4-SARS-CoV-2 screen negative 9/4-blood and tissue cultures-MSSA 9/7-blood cultures negative.  Antimicrobials: Anti-infectives (From admission, onward)   Start     Dose/Rate Route Frequency Ordered Stop   07/10/19 1600  metroNIDAZOLE (FLAGYL) IVPB 500 mg  Status:  Discontinued     500 mg 100 mL/hr over 60 Minutes Intravenous Every 8 hours 07/10/19 1549 07/13/19 0939   07/09/19 0839  vancomycin  (VANCOCIN) powder  Status:  Discontinued       As needed 07/09/19 0839 07/09/19 0935   07/09/19 0839  tobramycin (NEBCIN) powder  Status:  Discontinued       As needed 07/09/19 0840 07/09/19 0935   07/07/19 1830  ceFAZolin (ANCEF) IVPB 2g/100 mL premix     2 g 200 mL/hr over 30 Minutes Intravenous Every 8 hours 07/07/19 1333     07/07/19 0900  ceFAZolin (ANCEF) IVPB 2g/100 mL premix  Status:  Discontinued     2 g 200 mL/hr over 30 Minutes Intravenous Every 8 hours 07/07/19 0847 07/07/19 1333   07/06/19 2030  clindamycin (CLEOCIN) IVPB 600 mg  Status:  Discontinued     600 mg 100 mL/hr over 30 Minutes Intravenous Every 8 hours 07/06/19 2010 07/07/19 0847   07/06/19 1800  vancomycin (VANCOCIN) 1,500 mg in sodium chloride 0.9 % 500 mL IVPB  Status:  Discontinued     1,500 mg 250 mL/hr over 120 Minutes Intravenous Every 12 hours 07/06/19 0820 07/07/19 0847   07/06/19 1400  ceFEPIme (MAXIPIME) 2 g in sodium chloride 0.9 % 100 mL IVPB  Status:  Discontinued     2 g 200 mL/hr over 30 Minutes Intravenous Every 8 hours 07/06/19 0504 07/07/19 0847   07/06/19 1400  vancomycin (VANCOCIN) 2,000 mg in sodium chloride 0.9 % 500 mL IVPB  Status:  Discontinued  2,000 mg 250 mL/hr over 120 Minutes Intravenous Every 8 hours 07/06/19 0504 07/06/19 0818   07/06/19 0745  clindamycin (CLEOCIN) IVPB 600 mg  Status:  Discontinued     600 mg 100 mL/hr over 30 Minutes Intravenous  Once 07/06/19 0737 07/06/19 2029   07/06/19 0500  ceFEPIme (MAXIPIME) 2 g in sodium chloride 0.9 % 100 mL IVPB     2 g 200 mL/hr over 30 Minutes Intravenous  Once 07/06/19 0451 07/06/19 1335   07/06/19 0500  vancomycin (VANCOCIN) 2,500 mg in sodium chloride 0.9 % 500 mL IVPB     2,500 mg 250 mL/hr over 120 Minutes Intravenous  Once 07/06/19 0451 07/06/19 1335      Sch Meds:  Scheduled Meds: . acetaminophen  650 mg Oral Q6H   Or  . acetaminophen  650 mg Rectal Q6H  . Chlorhexidine Gluconate Cloth  6 each Topical Daily  .  enoxaparin (LOVENOX) injection  0.5 mg/kg Subcutaneous Q24H  . gabapentin  300 mg Oral TID  . hydrALAZINE  25 mg Oral BID  . insulin aspart  0-15 Units Subcutaneous TID WC  . insulin aspart  0-5 Units Subcutaneous QHS  . insulin aspart  8 Units Subcutaneous TID WC  . insulin NPH Human  23 Units Subcutaneous BID AC & HS  . metoCLOPramide  5 mg Oral TID AC  . polyethylene glycol  17 g Oral BID  . senna-docusate  1 tablet Oral BID  . sodium chloride flush  10-40 mL Intracatheter Q12H  . sodium chloride flush  3 mL Intravenous Q12H   Continuous Infusions: .  ceFAZolin (ANCEF) IV 2 g (07/16/19 0917)   PRN Meds:.albuterol, fentaNYL (SUBLIMAZE) injection, hydrALAZINE, methocarbamol, ondansetron **OR** ondansetron (ZOFRAN) IV, oxyCODONE, sodium chloride flush, zolpidem   I have personally reviewed the following labs and images: CBC: Recent Labs  Lab 07/10/19 0521 07/12/19 0314 07/13/19 0830 07/15/19 0504  WBC 8.6 10.0 11.5* 12.7*  HGB 10.2* 10.1* 10.4* 9.7*  HCT 31.0* 31.1* 33.4* 30.5*  MCV 90.9 91.7 97.4 94.7  PLT 216 334 372 338   BMP &GFR Recent Labs  Lab 07/10/19 0521 07/11/19 0345 07/12/19 0314 07/13/19 0830 07/15/19 0504  NA 127* 128* 131* 131* 133*  K 3.9 3.5 3.4* 4.1 4.1  CL 88* 87* 89* 92* 93*  CO2 27 32 28 29 32  GLUCOSE 312* 247* 187* 141* 191*  BUN 5* 5* <5* <5* <5*  CREATININE 0.69 0.63 0.59* 0.53* 0.46*  CALCIUM 7.9* 8.3* 8.1* 8.1* 8.1*  MG  --   --  1.9 1.9 1.8   Estimated Creatinine Clearance: 188.1 mL/min (A) (by C-G formula based on SCr of 0.46 mg/dL (L)). Liver & Pancreas: No results for input(s): AST, ALT, ALKPHOS, BILITOT, PROT, ALBUMIN in the last 168 hours. No results for input(s): LIPASE, AMYLASE in the last 168 hours. No results for input(s): AMMONIA in the last 168 hours. Diabetic: No results for input(s): HGBA1C in the last 72 hours. Recent Labs  Lab 07/15/19 1601 07/15/19 1756 07/15/19 2136 07/16/19 0733 07/16/19 1202  GLUCAP 223*  235* 149* 217* 174*   Cardiac Enzymes: No results for input(s): CKTOTAL, CKMB, CKMBINDEX, TROPONINI in the last 168 hours. No results for input(s): PROBNP in the last 8760 hours. Coagulation Profile: No results for input(s): INR, PROTIME in the last 168 hours. Thyroid Function Tests: No results for input(s): TSH, T4TOTAL, FREET4, T3FREE, THYROIDAB in the last 72 hours. Lipid Profile: No results for input(s): CHOL, HDL, LDLCALC, TRIG, CHOLHDL, LDLDIRECT  in the last 72 hours. Anemia Panel: No results for input(s): VITAMINB12, FOLATE, FERRITIN, TIBC, IRON, RETICCTPCT in the last 72 hours. Urine analysis:    Component Value Date/Time   COLORURINE YELLOW 07/06/2019 [place] 07/06/2019 0437   LABSPEC 1.024 07/06/2019 0437   PHURINE 5.0 07/06/2019 0437   GLUCOSEU >=500 (A) 07/06/2019 0437   HGBUR MODERATE (A) 07/06/2019 0437   BILIRUBINUR NEGATIVE 07/06/2019 0437   KETONESUR 80 (A) 07/06/2019 0437   PROTEINUR NEGATIVE 07/06/2019 0437   NITRITE NEGATIVE 07/06/2019 0437   LEUKOCYTESUR NEGATIVE 07/06/2019 0437   Sepsis Labs: Invalid input(s): PROCALCITONIN, [place]  Microbiology: Recent Results (from the past 240 hour(s))  Culture, fungus without smear     Status: None (Preliminary result)   Collection Time: 07/06/19  6:48 PM   Specimen: Abscess; Other  Result Value Ref Range Status   Specimen Description ABSCESS PARASPINAL  Final   Special Requests NONE  Final   Culture   Final    NO FUNGUS ISOLATED AFTER 7 DAYS Performed at [facility], [address] [place], [place] [address]    Report Status PENDING  Incomplete  Aerobic/Anaerobic Culture (surgical/deep wound)     Status: None   Collection Time: 07/06/19  6:48 PM   Specimen: Abscess  Result Value Ref Range Status   Specimen Description ABSCESS  Final   Special Requests PARASPINAL  Final   Gram Stain   Final    FEW WBC PRESENT, PREDOMINANTLY PMN ABUNDANT GRAM POSITIVE COCCI    Culture    Final    ABUNDANT STAPHYLOCOCCUS AUREUS NO ANAEROBES ISOLATED Performed at [facility], [place] [address], [place], [place] [address]    Report Status 07/12/2019 FINAL  Final   Organism ID, Bacteria STAPHYLOCOCCUS AUREUS  Final      Susceptibility   Staphylococcus aureus - MIC*    CIPROFLOXACIN <=0.5 SENSITIVE Sensitive     ERYTHROMYCIN >=8 RESISTANT Resistant     GENTAMICIN <=0.5 SENSITIVE Sensitive     OXACILLIN <=0.25 SENSITIVE Sensitive     TETRACYCLINE >=16 RESISTANT Resistant     VANCOMYCIN 1 SENSITIVE Sensitive     TRIMETH/SULFA <=10 SENSITIVE Sensitive     CLINDAMYCIN RESISTANT Resistant     RIFAMPIN <=0.5 SENSITIVE Sensitive     Inducible Clindamycin POSITIVE Resistant     * ABUNDANT STAPHYLOCOCCUS AUREUS  Aerobic/Anaerobic Culture (surgical/deep wound)     Status: None   Collection Time: 07/09/19  8:32 AM   Specimen: Abscess  Result Value Ref Range Status   Specimen Description ABSCESS HIP RIGHT  Final   Special Requests NONE  Final   Gram Stain   Final    MODERATE WBC PRESENT,BOTH PMN AND MONONUCLEAR ABUNDANT GRAM POSITIVE COCCI IN PAIRS IN CLUSTERS    Culture   Final    FEW STAPHYLOCOCCUS AUREUS NO ANAEROBES ISOLATED Performed at [facility], [place] [address], [place], [place] [address]    Report Status 07/14/2019 FINAL  Final   Organism ID, Bacteria STAPHYLOCOCCUS AUREUS  Final      Susceptibility   Staphylococcus aureus - MIC*    CIPROFLOXACIN <=0.5 SENSITIVE Sensitive     ERYTHROMYCIN >=8 RESISTANT Resistant     GENTAMICIN <=0.5 SENSITIVE Sensitive     OXACILLIN <=0.25 SENSITIVE Sensitive     TETRACYCLINE >=16 RESISTANT Resistant     VANCOMYCIN 1 SENSITIVE Sensitive     TRIMETH/SULFA <=10 SENSITIVE Sensitive     CLINDAMYCIN RESISTANT Resistant  RIFAMPIN <=0.5 SENSITIVE Sensitive     Inducible Clindamycin POSITIVE Resistant     * FEW STAPHYLOCOCCUS AUREUS  Culture, blood (routine x 2)     Status: None   Collection Time: 07/09/19 12:57 PM    Specimen: BLOOD RIGHT HAND  Result Value Ref Range Status   Specimen Description BLOOD RIGHT HAND  Final   Special Requests   Final    AEROBIC BOTTLE ONLY Blood Culture results may not be optimal due to an inadequate volume of blood received in culture bottles   Culture   Final    NO GROWTH 5 DAYS Performed at [facility], [place] [address], [place], [place] [address]    Report Status 07/14/2019 FINAL  Final  Culture, blood (routine x 2)     Status: None   Collection Time: 07/09/19  1:06 PM   Specimen: BLOOD RIGHT HAND  Result Value Ref Range Status   Specimen Description BLOOD RIGHT HAND  Final   Special Requests AEROBIC BOTTLE ONLY Blood Culture adequate volume  Final   Culture   Final    NO GROWTH 5 DAYS Performed at [facility], [address], [place], [place] [address]    Report Status 07/14/2019 FINAL  Final    Radiology Studies: No results found.  Bonnell Public, M.D. [facility]  If 7PM-7AM, please contact night-coverage [email or web address] Password [facility] 07/16/2019, 4:27 PM

## 2019-07-17 ENCOUNTER — Inpatient Hospital Stay (HOSPITAL_COMMUNITY): Payer: Self-pay | Admitting: Anesthesiology

## 2019-07-17 ENCOUNTER — Encounter (HOSPITAL_COMMUNITY)
Admission: EM | Disposition: A | Discharge status: Rehabilitation Facility | Payer: Self-pay | Source: Home / Self Care | Attending: Family Medicine

## 2019-07-17 HISTORY — PX: APPLICATION OF WOUND VAC: SHX5189

## 2019-07-17 HISTORY — PX: INCISION AND DRAINAGE HIP: SHX1801

## 2019-07-17 LAB — CBC WITH DIFFERENTIAL/PLATELET
Abs Immature Granulocytes: 0.15 10*3/uL — ABNORMAL HIGH (ref 0.00–0.07)
Basophils Absolute: 0 10*3/uL (ref 0.0–0.1)
Basophils Relative: 0 %
Eosinophils Absolute: 0.2 10*3/uL (ref 0.0–0.5)
Eosinophils Relative: 2 %
HCT: 29.8 % — ABNORMAL LOW (ref 39.0–52.0)
Hemoglobin: 9.7 g/dL — ABNORMAL LOW (ref 13.0–17.0)
Immature Granulocytes: 1 %
Lymphocytes Relative: 12 %
Lymphs Abs: 1.6 10*3/uL (ref 0.7–4.0)
MCH: 30.7 pg (ref 26.0–34.0)
MCHC: 32.6 g/dL (ref 30.0–36.0)
MCV: 94.3 fL (ref 80.0–100.0)
Monocytes Absolute: 0.8 10*3/uL (ref 0.1–1.0)
Monocytes Relative: 7 %
Neutro Abs: 10.1 10*3/uL — ABNORMAL HIGH (ref 1.7–7.7)
Neutrophils Relative %: 78 %
Platelets: 310 10*3/uL (ref 150–400)
RBC: 3.16 MIL/uL — ABNORMAL LOW (ref 4.22–5.81)
RDW: 13.1 % (ref 11.5–15.5)
WBC: 12.9 10*3/uL — ABNORMAL HIGH (ref 4.0–10.5)
nRBC: 0 % (ref 0.0–0.2)

## 2019-07-17 LAB — GLUCOSE, CAPILLARY
Glucose-Capillary: 176 mg/dL — ABNORMAL HIGH (ref 70–99)
Glucose-Capillary: 140 mg/dL — ABNORMAL HIGH (ref 70–99)
Glucose-Capillary: 143 mg/dL — ABNORMAL HIGH (ref 70–99)
Glucose-Capillary: 106 mg/dL — ABNORMAL HIGH (ref 70–99)
Glucose-Capillary: 88 mg/dL (ref 70–99)
Glucose-Capillary: 112 mg/dL — ABNORMAL HIGH (ref 70–99)

## 2019-07-17 SURGERY — APPLICATION, WOUND VAC
Anesthesia: General | Site: Hip | Laterality: Right

## 2019-07-17 MED ORDER — FENTANYL CITRATE (PF) 100 MCG/2ML IJ SOLN
INTRAMUSCULAR | Status: AC
  Filled 2019-07-17: qty 2

## 2019-07-17 MED ORDER — PROMETHAZINE HCL 25 MG/ML IJ SOLN: 6.2500 mg | INTRAMUSCULAR | Status: DC | PRN

## 2019-07-17 MED ORDER — PROPOFOL 10 MG/ML IV BOLUS
INTRAVENOUS | Status: DC | PRN
  Administered 2019-07-17: 200 mg via INTRAVENOUS

## 2019-07-17 MED ORDER — FENTANYL CITRATE (PF) 100 MCG/2ML IJ SOLN
25.0000 ug | INTRAMUSCULAR | Status: DC | PRN
  Administered 2019-07-17 (×2): 50 ug via INTRAVENOUS

## 2019-07-17 MED ORDER — GENTAMICIN SULFATE 40 MG/ML IJ SOLN
INTRAMUSCULAR | Status: AC
  Filled 2019-07-17: qty 2

## 2019-07-17 MED ORDER — ONDANSETRON HCL 4 MG/2ML IJ SOLN
INTRAMUSCULAR | Status: DC | PRN
  Administered 2019-07-17: 4 mg via INTRAVENOUS

## 2019-07-17 MED ORDER — DEXAMETHASONE SODIUM PHOSPHATE 10 MG/ML IJ SOLN
INTRAMUSCULAR | Status: AC
  Filled 2019-07-17: qty 1

## 2019-07-17 MED ORDER — LACTATED RINGERS IV SOLN
INTRAVENOUS | Status: DC
  Administered 2019-07-17 – 2019-07-28 (×4): via INTRAVENOUS

## 2019-07-17 MED ORDER — LIDOCAINE 2% (20 MG/ML) 5 ML SYRINGE
INTRAMUSCULAR | Status: DC | PRN
  Administered 2019-07-17: 100 mg via INTRAVENOUS

## 2019-07-17 MED ORDER — LIDOCAINE 2% (20 MG/ML) 5 ML SYRINGE
INTRAMUSCULAR | Status: AC
  Filled 2019-07-17: qty 5

## 2019-07-17 MED ORDER — GENTAMICIN SULFATE 40 MG/ML IJ SOLN
INTRAMUSCULAR | Status: DC | PRN
  Administered 2019-07-17: 80 mg

## 2019-07-17 MED ORDER — 0.9 % SODIUM CHLORIDE (POUR BTL) OPTIME
TOPICAL | Status: DC | PRN
  Administered 2019-07-17: 1000 mL

## 2019-07-17 MED ORDER — MIDAZOLAM HCL 2 MG/2ML IJ SOLN
INTRAMUSCULAR | Status: AC
  Filled 2019-07-17: qty 2

## 2019-07-17 MED ORDER — VANCOMYCIN HCL 1000 MG IV SOLR
INTRAVENOUS | Status: DC | PRN
  Administered 2019-07-17: 1000 mg via TOPICAL

## 2019-07-17 MED ORDER — KETOROLAC TROMETHAMINE 30 MG/ML IJ SOLN
INTRAMUSCULAR | Status: AC
  Filled 2019-07-17: qty 1

## 2019-07-17 MED ORDER — SUCCINYLCHOLINE CHLORIDE 200 MG/10ML IV SOSY
PREFILLED_SYRINGE | INTRAVENOUS | Status: DC | PRN
  Administered 2019-07-17: 160 mg via INTRAVENOUS

## 2019-07-17 MED ORDER — SODIUM CHLORIDE 0.9 % IR SOLN
Status: DC | PRN
  Administered 2019-07-17: 6000 mL

## 2019-07-17 MED ORDER — KETOROLAC TROMETHAMINE 30 MG/ML IJ SOLN
30.0000 mg | Freq: Once | INTRAMUSCULAR | Status: AC | PRN
  Administered 2019-07-17: 30 mg via INTRAVENOUS

## 2019-07-17 MED ORDER — VANCOMYCIN HCL 1000 MG IV SOLR
INTRAVENOUS | Status: AC
  Filled 2019-07-17: qty 1000

## 2019-07-17 MED ORDER — PROPOFOL 10 MG/ML IV BOLUS
INTRAVENOUS | Status: AC
  Filled 2019-07-17: qty 20

## 2019-07-17 MED ORDER — FENTANYL CITRATE (PF) 250 MCG/5ML IJ SOLN
INTRAMUSCULAR | Status: AC
  Filled 2019-07-17: qty 5

## 2019-07-17 MED ORDER — MIDAZOLAM HCL 2 MG/2ML IJ SOLN
INTRAMUSCULAR | Status: DC | PRN
  Administered 2019-07-17: 2 mg via INTRAVENOUS

## 2019-07-17 MED ORDER — ONDANSETRON HCL 4 MG/2ML IJ SOLN
INTRAMUSCULAR | Status: AC
  Filled 2019-07-17: qty 2

## 2019-07-17 MED ORDER — FENTANYL CITRATE (PF) 250 MCG/5ML IJ SOLN
INTRAMUSCULAR | Status: DC | PRN
  Administered 2019-07-17 (×2): 50 ug via INTRAVENOUS
  Administered 2019-07-17: 100 ug via INTRAVENOUS
  Administered 2019-07-17: 50 ug via INTRAVENOUS

## 2019-07-17 SURGICAL SUPPLY — 50 items
BRUSH SCRUB EZ PLAIN DRY (MISCELLANEOUS) ×6 IMPLANT
CANISTER WOUNDNEG PRESSURE 500 (CANNISTER) ×1 IMPLANT
COVER SURGICAL LIGHT HANDLE (MISCELLANEOUS) ×6 IMPLANT
DRAPE C-ARMOR (DRAPES) ×3 IMPLANT
DRAPE INCISE IOBAN 66X45 STRL (DRAPES) ×1 IMPLANT
DRAPE ORTHO SPLIT 77X108 STRL (DRAPES) ×2
DRAPE SURG ORHT 6 SPLT 77X108 (DRAPES) ×4 IMPLANT
DRAPE U-SHAPE 47X51 STRL (DRAPES) ×3 IMPLANT
DRESSING VERAFLO CLEANSE CC (GAUZE/BANDAGES/DRESSINGS) IMPLANT
DRSG PAD ABDOMINAL 8X10 ST (GAUZE/BANDAGES/DRESSINGS) ×3 IMPLANT
DRSG VAC ATS LRG SENSATRAC (GAUZE/BANDAGES/DRESSINGS) ×1 IMPLANT
DRSG VERAFLO CLEANSE CC (GAUZE/BANDAGES/DRESSINGS) ×3
ELECT BLADE 6.5 EXT (BLADE) ×2 IMPLANT
ELECT CAUTERY BLADE 6.4 (BLADE) ×2 IMPLANT
ELECT REM PT RETURN 9FT ADLT (ELECTROSURGICAL) ×3
ELECTRODE REM PT RTRN 9FT ADLT (ELECTROSURGICAL) ×2 IMPLANT
EVACUATOR 1/8 PVC DRAIN (DRAIN) IMPLANT
GAUZE SPONGE 4X4 12PLY STRL (GAUZE/BANDAGES/DRESSINGS) ×3 IMPLANT
GAUZE XEROFORM 5X9 LF (GAUZE/BANDAGES/DRESSINGS) ×3 IMPLANT
GLOVE BIO SURGEON STRL SZ7.5 (GLOVE) ×4 IMPLANT
GLOVE BIO SURGEON STRL SZ8 (GLOVE) ×3 IMPLANT
GLOVE BIOGEL PI IND STRL 7.5 (GLOVE) ×2 IMPLANT
GLOVE BIOGEL PI IND STRL 8 (GLOVE) ×2 IMPLANT
GLOVE BIOGEL PI INDICATOR 7.5 (GLOVE) ×2
GLOVE BIOGEL PI INDICATOR 8 (GLOVE) ×1
GOWN STRL REUS W/ TWL XL LVL3 (GOWN DISPOSABLE) ×2 IMPLANT
GOWN STRL REUS W/TWL 2XL LVL3 (GOWN DISPOSABLE) ×9 IMPLANT
GOWN STRL REUS W/TWL XL LVL3 (GOWN DISPOSABLE) ×1
HANDPIECE INTERPULSE COAX TIP (DISPOSABLE) ×1
KIT STIMULAN RAPID CURE  10CC (Orthopedic Implant) ×1 IMPLANT
KIT STIMULAN RAPID CURE 10CC (Orthopedic Implant) IMPLANT
KIT TURNOVER KIT B (KITS) ×3 IMPLANT
MANIFOLD NEPTUNE II (INSTRUMENTS) ×3 IMPLANT
NS IRRIG 1000ML POUR BTL (IV SOLUTION) ×3 IMPLANT
PACK TOTAL JOINT (CUSTOM PROCEDURE TRAY) ×3 IMPLANT
PACK UNIVERSAL I (CUSTOM PROCEDURE TRAY) ×3 IMPLANT
PAD ARMBOARD 7.5X6 YLW CONV (MISCELLANEOUS) ×6 IMPLANT
SET HNDPC FAN SPRY TIP SCT (DISPOSABLE) IMPLANT
SPONGE LAP 18X18 X RAY DECT (DISPOSABLE) ×1 IMPLANT
STAPLER VISISTAT 35W (STAPLE) ×3 IMPLANT
SUT VIC AB 0 CT1 27 (SUTURE) ×2
SUT VIC AB 0 CT1 27XBRD ANBCTR (SUTURE) ×4 IMPLANT
SUT VIC AB 1 CT1 27 (SUTURE) ×1
SUT VIC AB 1 CT1 27XBRD ANBCTR (SUTURE) ×2 IMPLANT
SUT VIC AB 2-0 CT1 27 (SUTURE) ×1
SUT VIC AB 2-0 CT1 TAPERPNT 27 (SUTURE) ×2 IMPLANT
TOWEL GREEN STERILE (TOWEL DISPOSABLE) ×6 IMPLANT
TOWEL GREEN STERILE FF (TOWEL DISPOSABLE) ×4 IMPLANT
WATER STERILE IRR 1000ML POUR (IV SOLUTION) ×12 IMPLANT
YANKAUER SUCT BULB TIP NO VENT (SUCTIONS) ×1 IMPLANT

## 2019-07-17 NOTE — Progress Notes (Signed)
Orthopedic Trauma Service Progress Note  Patient ID: Bradley Cox MRN: [id number] DOB/AGE: [birth year] 48 y.o.  Subjective:  Contacted regarding patient's draining wound from his right hip Patient is 8 days postop from I&D R gluteal abscess  Apparently did well over the weekend however has had increased drainage since yesterday  WBC count has also been slowly trending up over the last 3 to 4 days.  Patient does remain afebrile Remains on IV antibiotics (Ancef for MSSA)  Patient reports persistent right hip and thigh pain. Mild improvement in motor and sensory functions to his right lower leg along the sciatic nerve distribution  ROS As above  Objective:   VITALS:   Vitals:   07/17/19 0329 07/17/19 0846 07/17/19 1209 07/17/19 1303  BP: 113/68 112/72 132/82   Pulse: 93 96 (!) 106 (!) 102  Resp:  10  19  Temp: 98.5 F (36.9 C) 97.9 F (36.6 C) 98.6 F (37 C)   TempSrc: Oral Oral Oral   SpO2: 100% 93% 94% 96%  Weight:      Height:        Estimated body mass index is 41.78 kg/m as calculated from the following:   Height as of this encounter: 6\' 6"  (1.981 m).   Weight as of this encounter: 164 kg.   Intake/Output      09/14 0701 - 09/15 0700 09/15 0701 - 09/16 0700   P.O. 600    I.V. (mL/kg)     Total Intake(mL/kg) 600 (3.7)    Urine (mL/kg/hr) 7600 (1.9)    Stool 0    Total Output 7600    Net -7000           LABS  Results for orders placed or performed during the hospital encounter of 07/05/19 (from the past 24 hour(s))  Glucose, capillary     Status: Abnormal   Collection Time: 07/16/19  5:19 PM  Result Value Ref Range   Glucose-Capillary 138 (H) 70 - 99 mg/dL  Glucose, capillary     Status: Abnormal   Collection Time: 07/16/19  9:35 PM  Result Value Ref Range   Glucose-Capillary 179 (H) 70 - 99 mg/dL  CBC with Differential/Platelet     Status: Abnormal   Collection Time:  07/17/19  5:00 AM  Result Value Ref Range   WBC 12.9 (H) 4.0 - 10.5 K/uL   RBC 3.16 (L) 4.22 - 5.81 MIL/uL   Hemoglobin 9.7 (L) 13.0 - 17.0 g/dL   HCT 29.8 (L) 39.0 - 52.0 %   MCV 94.3 80.0 - 100.0 fL   MCH 30.7 26.0 - 34.0 pg   MCHC 32.6 30.0 - 36.0 g/dL   RDW 13.1 11.5 - 15.5 %   Platelets 310 150 - 400 K/uL   nRBC 0.0 0.0 - 0.2 %   Neutrophils Relative % 78 %   Neutro Abs 10.1 (H) 1.7 - 7.7 K/uL   Lymphocytes Relative 12 %   Lymphs Abs 1.6 0.7 - 4.0 K/uL   Monocytes Relative 7 %   Monocytes Absolute 0.8 0.1 - 1.0 K/uL   Eosinophils Relative 2 %   Eosinophils Absolute 0.2 0.0 - 0.5 K/uL   Basophils Relative 0 %   Basophils Absolute 0.0 0.0 - 0.1 K/uL   Immature Granulocytes 1 %   Abs Immature Granulocytes 0.15 (  H) 0.00 - 0.07 K/uL  Glucose, capillary     Status: Abnormal   Collection Time: 07/17/19  8:43 AM  Result Value Ref Range   Glucose-Capillary 176 (H) 70 - 99 mg/dL  Glucose, capillary     Status: Abnormal   Collection Time: 07/17/19 12:08 PM  Result Value Ref Range   Glucose-Capillary 140 (H) 70 - 99 mg/dL     PHYSICAL EXAM:   Gen: Uncomfortable appearing but did just work with therapy Lungs: breathing unlabored Ext:       Right Lower Extremity   Decreased erythema to the right lateral thigh gluteal region  There is some pitting edema  Incision line is stable but there is moderate drainage noted from the proximal limb.  This is noted to be on the gauze and ABD.  Drainage does appear to be fairly opaque   Chronic edema noted distally   Good active ankle flexion extension  Sensory functions are grossly unchanged  Assessment/Plan: 6 Days Post-Op   Principal Problem:   MSSA bacteremia Active Problems:   Obesity, Class III, BMI 40-49.9 (morbid obesity) ([place])   Essential hypertension   Sepsis ([place])   AKI (acute kidney injury) ([place])   Hyperkalemia   Abscess of right hip   Paraspinal abscess ([facility])   Compression of right sciatic nerve   Anti-infectives  (From admission, onward)   Start     Dose/Rate Route Frequency Ordered Stop   07/10/19 1600  metroNIDAZOLE (FLAGYL) IVPB 500 mg  Status:  Discontinued     500 mg 100 mL/hr over 60 Minutes Intravenous Every 8 hours 07/10/19 1549 07/13/19 0939   07/09/19 0839  vancomycin (VANCOCIN) powder  Status:  Discontinued       As needed 07/09/19 0839 07/09/19 0935   07/09/19 0839  tobramycin (NEBCIN) powder  Status:  Discontinued       As needed 07/09/19 0840 07/09/19 0935   07/07/19 1830  ceFAZolin (ANCEF) IVPB 2g/100 mL premix     2 g 200 mL/hr over 30 Minutes Intravenous Every 8 hours 07/07/19 1333     07/07/19 0900  ceFAZolin (ANCEF) IVPB 2g/100 mL premix  Status:  Discontinued     2 g 200 mL/hr over 30 Minutes Intravenous Every 8 hours 07/07/19 0847 07/07/19 1333   07/06/19 2030  clindamycin (CLEOCIN) IVPB 600 mg  Status:  Discontinued     600 mg 100 mL/hr over 30 Minutes Intravenous Every 8 hours 07/06/19 2010 07/07/19 0847   07/06/19 1800  vancomycin (VANCOCIN) 1,500 mg in sodium chloride 0.9 % 500 mL IVPB  Status:  Discontinued     1,500 mg 250 mL/hr over 120 Minutes Intravenous Every 12 hours 07/06/19 0820 07/07/19 0847   07/06/19 1400  ceFEPIme (MAXIPIME) 2 g in sodium chloride 0.9 % 100 mL IVPB  Status:  Discontinued     2 g 200 mL/hr over 30 Minutes Intravenous Every 8 hours 07/06/19 0504 07/07/19 0847   07/06/19 1400  vancomycin (VANCOCIN) 2,000 mg in sodium chloride 0.9 % 500 mL IVPB  Status:  Discontinued     2,000 mg 250 mL/hr over 120 Minutes Intravenous Every 8 hours 07/06/19 0504 07/06/19 0818   07/06/19 0745  clindamycin (CLEOCIN) IVPB 600 mg  Status:  Discontinued     600 mg 100 mL/hr over 30 Minutes Intravenous  Once 07/06/19 0737 07/06/19 2029   07/06/19 0500  ceFEPIme (MAXIPIME) 2 g in sodium chloride 0.9 % 100 mL IVPB     2 g 200  mL/hr over 30 Minutes Intravenous  Once 07/06/19 0451 07/06/19 1335   07/06/19 0500  vancomycin (VANCOCIN) 2,500 mg in sodium chloride 0.9 %  500 mL IVPB     2,500 mg 250 mL/hr over 120 Minutes Intravenous  Once 07/06/19 0451 07/06/19 1335    .  POD/HD#: 50  48 y/o male with multifocal abscesses s/p I&D right gluteal abscess  - R gluteal abscess s/p I&D 8 days ago with increased drainage and continued pain  Drainage does appear to be somewhat suspicious.  Also given laboratory findings of gradually climbing WBC count and persistent pain do think that the patient would benefit from repeat irrigation and debridement.  Suspect we will likely proceed with I&D, placement of wound VAC and then possible return to the OR on Friday for subsequent I&D and closure   Discussed with patient, he agrees with the plan  - Pain management:  Continue per primary  - ID:   Per infectious disease  - Dispo:  Probable OR later on this evening   Jari Pigg, PA-C [phone] (C) 07/17/2019, 1:17 PM  [facility] [place] [address] [phone] Domingo Sep (F)

## 2019-07-17 NOTE — Progress Notes (Signed)
Occupational Therapy Treatment Patient Details Name: Bradley Cox MRN: [id number] DOB: 1970-12-08 Today's Date: 07/17/2019    History of present illness Patient is a 48 y/o male who presents with right flank pain. Admitted with disseminated MSSA infection including bacteremia, Lumbosacral spinal epidural abscess, paraspinal/large gluteal/pelvic abscess with sciatica impingement. s/p I&D large gluteal abscess and neuroplasty of sciatica nerve 9/7. s/p CT aspiration right paraspinal abscess. PMH includes DM, HTN, morbid obesity, OSA.   OT comments  Pt progressing towards established OT goals. However, continues to present with decrease balance, strength, and activity tolerance with increased pain. Pt agreeable to perform OOB activity and verbalized understanding of importance of mobility to increase strength and decrease pain. Pt performing bed mobility and functional mobility with Min A +2 and RW. SpO2 >93% on RA and BP stable. Pt verbalizing frustration with NPO diet and possible sx stating "I just want to know what they are going to do with me." Continue to recommend dc to CIR and will continue to follow acutely as admitted.    Follow Up Recommendations  CIR;Supervision/Assistance - 24 hour    Equipment Recommendations  Other (comment)(tbd to next venue)    Recommendations for Other Services PT consult    Precautions / Restrictions Precautions Precautions: Fall Restrictions Weight Bearing Restrictions: Yes RLE Weight Bearing: Weight bearing as tolerated       Mobility Bed Mobility Overal bed mobility: Needs Assistance Bed Mobility: Rolling;Sidelying to Sit Rolling: Min assist;+2 for physical assistance Sidelying to sit: Min assist;+2 for physical assistance;HOB elevated       General bed mobility comments: Increased time and effort. Min A to roll to left side and then elevate trunk. Pt using bedrails for pulling and then to push into sitting  Transfers Overall transfer  level: Needs assistance Equipment used: Rolling walker (2 wheeled) Transfers: Sit to/from [facility] Sit to Stand: Min assist;+2 physical assistance;From elevated surface         General transfer comment: Min A +2 for power up into standing.     Balance Overall balance assessment: Needs assistance Sitting-balance support: Feet supported;Bilateral upper extremity supported Sitting balance-Leahy Scale: Fair     Standing balance support: Bilateral upper extremity supported Standing balance-Leahy Scale: Poor Standing balance comment: reliant on BUE during funcitonal mobility                           ADL either performed or assessed with clinical judgement   ADL Overall ADL's : Needs assistance/impaired     Grooming: Oral care;Set up;Sitting  Performing oral care once sitting in recliner.                 Toilet Transfer: +2 for physical assistance;RW;+2 for safety/equipment;Minimal assistance;Ambulation(Simulated to recliner) Toilet Transfer Details (indicate cue type and reason): Pt performing mobility forward (~4 feet) and then sitting at recliner.         Functional mobility during ADLs: +2 for safety/equipment;Minimal assistance;Rolling walker General ADL Comments: Pt continues to present with decreased functional performance and is limited by pain. Benefits from increased cues for encouragement.     Vision       Perception     Praxis      Cognition Arousal/Alertness: Awake/alert Behavior During Therapy: WFL for tasks assessed/performed Overall Cognitive Status: Within Functional Limits for tasks assessed  General Comments: Distracted by pain and requiring increased time        Exercises General Exercises - Lower Extremity Long Arc Quad: Left;AROM;Seated;10 reps Heel Slides: AAROM;Right;10 reps;Supine   Shoulder Instructions       General Comments BP stable throughout. SpO2  >93% on RA.     Pertinent Vitals/ Pain       Pain Assessment: Faces Faces Pain Scale: Hurts whole lot Pain Location: RLE Pain Descriptors / Indicators: Constant;Discomfort;Grimacing(Cussing) Pain Intervention(s): Monitored during session;Limited activity within patient's tolerance;Repositioned  Home Living                                          Prior Functioning/Environment              Frequency  Min 3X/week        Progress Toward Goals  OT Goals(current goals can now be found in the care plan section)  Progress towards OT goals: Progressing toward goals  Acute Rehab OT Goals Patient Stated Goal: to get back home OT Goal Formulation: With patient Time For Goal Achievement: 07/24/19 Potential to Achieve Goals: Good ADL Goals Pt Will Perform Grooming: with modified independence;sitting Pt Will Perform Upper Body Dressing: with modified independence;sitting Pt Will Perform Lower Body Dressing: with min guard assist;with adaptive equipment;sit to/from stand;sitting/lateral leans Pt Will Transfer to Toilet: with min assist;bedside commode;stand pivot transfer Pt Will Perform Toileting - Clothing Manipulation and hygiene: with min guard assist;sitting/lateral leans;sit to/from stand Additional ADL Goal #1: Pt will perform bed mobility with Min Guard A in preparation for ADLs.  Plan Discharge plan remains appropriate    Co-evaluation                 AM-PAC OT "6 Clicks" Daily Activity     Outcome Measure   Help from another person eating meals?: None Help from another person taking care of personal grooming?: A Little Help from another person toileting, which includes using toliet, bedpan, or urinal?: A Lot Help from another person bathing (including washing, rinsing, drying)?: A Lot Help from another person to put on and taking off regular upper body clothing?: A Lot Help from another person to put on and taking off regular lower body  clothing?: Total 6 Click Score: 14    End of Session Equipment Utilized During Treatment: Rolling walker(bariatric)  OT Visit Diagnosis: Unsteadiness on feet (R26.81);Other abnormalities of gait and mobility (R26.89);Muscle weakness (generalized) (M62.81);Other symptoms and signs involving cognitive function;Pain Pain - part of body: (Back)   Activity Tolerance Patient limited by pain   Patient Left in chair;with call bell/phone within reach   Nurse Communication Mobility status        Time: [id number] OT Time Calculation (min): 38 min  Charges: OT General Charges $OT Visit: 1 Visit OT Treatments $Self Care/Home Management : 23-37 mins $Therapeutic Activity: 8-22 mins  [place], OTR/L Acute Rehab Pager: [phone] Office: [place] 07/17/2019, 1:19 PM

## 2019-07-17 NOTE — Anesthesia Preprocedure Evaluation (Addendum)
Anesthesia Evaluation  Patient identified by MRN, date of birth, ID band Patient awake    Reviewed: Allergy & Precautions, H&P , NPO status , Patient's Chart, lab work & pertinent test results  Airway Mallampati: II  TM Distance: >3 FB Neck ROM: Full    Dental no notable dental hx. (+) Teeth Intact, Poor Dentition, Dental Advisory Given   Pulmonary shortness of breath, asthma , sleep apnea ,  Smokeless tobacco Albuterol inhaler uses only 3-4x/year   Pulmonary exam normal breath sounds clear to auscultation       Cardiovascular hypertension, Pt. on medications Normal cardiovascular exam Rhythm:Regular Rate:Normal  TEE 07/11/19: r/o endocarditis in setting of bacteremia 1. The left ventricle has normal systolic function, with an ejection fraction of 60-65%. There is moderate concentric left ventricular hypertrophy. No evidence of left ventricular regional wall motion abnormalities.  2. The right ventricle has normal systolc function. The cavity was normal. There is no increase in right ventricular wall thickness.  3. Left atrial size was moderately dilated.  4. No evidence of a thrombus present in the left atrial appendage.  5. The aortic root, ascending aorta, aortic arch and descending aorta are normal in size and structure.  6. A linear 11 mm echodensity on the ventricular surface of the anterior leaflet is most likely a redundant chorda tendina and is unlikely to represent vegetation.    Neuro/Psych  Neuromuscular disease negative psych ROS   GI/Hepatic Neg liver ROS, GERD  ,dysphagia   Endo/Other  diabetes, Type 2, Insulin DependentMorbid obesity  Renal/GU   negative genitourinary   Musculoskeletal  (+) Arthritis , Osteoarthritis,    Abdominal (+) + obese,   Peds  Hematology negative hematology ROS (+)   Anesthesia Other Findings   Reproductive/Obstetrics                          Anesthesia  Physical  Anesthesia Plan  ASA: III  Anesthesia Plan: General   Post-op Pain Management:    Induction: Intravenous  PONV Risk Score and Plan: 2 and Treatment may vary due to age or medical condition, Ondansetron and Dexamethasone  Airway Management Planned: Oral ETT  Additional Equipment: None  Intra-op Plan:   Post-operative Plan: Extubation in OR  Informed Consent: I have reviewed the patients History and Physical, chart, labs and discussed the procedure including the risks, benefits and alternatives for the proposed anesthesia with the patient or authorized representative who has indicated his/her understanding and acceptance.     Dental advisory given  Plan Discussed with: CRNA  Anesthesia Plan Comments:        Anesthesia Quick Evaluation

## 2019-07-17 NOTE — Progress Notes (Signed)
Inpatient Rehab Admissions Coordinator:   Attempted to meet with pt today to discuss slow progress and my recommendation for f/u with SNF for slower paced rehab.  Note pt off floor for another I&D.  Will try to f/u tomorrow.   Shann Medal, PT, DPT Admissions Coordinator [phone] 07/17/19  3:11 PM

## 2019-07-17 NOTE — Progress Notes (Signed)
PHARMACY CONSULT NOTE FOR:  OUTPATIENT  PARENTERAL ANTIBIOTIC THERAPY (OPAT)  Indication: MSSA Bacteremia/ Epidural abscess Regimen: Cefazolin 2g IV q8h End date: 08/06/2019  IV antibiotic discharge orders are pended. To discharging provider:  please sign these orders via discharge navigator,  Select New Orders & click on the button choice - Manage This Unsigned Work.     Thank you for allowing pharmacy to be a part of this patient's care.  Jimmy Footman, PharmD, BCPS, BCIDP Infectious Diseases Clinical Pharmacist Phone: [phone] 07/17/2019, 9:04 AM

## 2019-07-17 NOTE — Op Note (Signed)
NAME: KAIDYNN, SPIEKER MEDICAL RECORD [id number] ACCOUNT [id number] DATE OF BIRTH:Dec 28, 1970 FACILITY: [facility] LOCATION: [place], [place]  OPERATIVE REPORT  DATE OF PROCEDURE:  07/17/2019  PREOPERATIVE DIAGNOSES:  Recurrent deep right gluteal abscess.  POSTOPERATIVE DIAGNOSES:  Recurrent deep right gluteal abscess.  PROCEDURES: 1.  Incision and drainage of right deep abscess. 2.  Insertion of antibiotic beads using vancomycin and gentamicin. 3.  Application of large wound vacuum-assisted closure.  SURGEON:  Altamese [place], MD  ASSISTANT:  Ainsley Spinner, PA-C  ANESTHESIA:  General.  ESTIMATED BLOOD LOSS:  30 mL.  FINDINGS:  300 mL of gross purulence with deep extension around the hip abductors.  DISPOSITION:  To PACU.  CONDITION:  Stable.  INDICATION FOR PROCEDURE:  The patient is a 48 year old with poorly controlled diabetes and a hemoglobin A1c indicating an average glucose level of 315 who developed a right gluteal abscess that was treated with incision and drainage and appropriate  antibiotics for a methicillin-susceptible Staph aureus.  The patient, however, had recently increasing pain and purulence noted from his drainage.  Evaluation today did confirm gross purulence, and consequently decision was made to proceed to the OR for  repeat incision and drainage.  I did discuss with him the risks and benefits of the procedure including the necessity of keeping it open for a period of time. the probability of implantation of antibiotic beads and the need to return to the OR in the  next 72 hours for VAC change or possible closure.  We also discussed heart attack, stroke, anesthetic reaction and others, and he strongly wished to proceed.  BRIEF SUMMARY OF PROCEDURE:  The patient remained on his current regimen of antibiotics for MSSA and was taken to the operating room where general anesthesia was induced.  He was positioned right side up.  The old sutures  were removed, and at that point,  pus erupted from the wound.  We evacuated 300 mL.  It did extend from the subcutaneous layer below the tensor and then also around the short rotators and posterior aspect of the hip.  All this was evacuated, and then the soft tissue was scraped with a  large curette to remove fibrinous material and potential bacterial layer.  This was irrigated thoroughly and wiped out of the wound.  We then did a chlorhexidine soap wash and 6000 mL of fluid again, rubbing the surfaces with sponges to facilitate  removal of bacteria but using low pressure cysto-type 2 rather than Pulsavac itself.  After this was completed, fresh drapes and attire were applied, and then we inserted beads eluting antibiotics that contained both gentamicin and vancomycin consistent  with the susceptibility profile.  We then applied the reticulated deep wound VAC sponge to help facilitate some granulation to help combat the infection at the local tissue level as well as to facilitate later closure.  The patient was awakened from  anesthesia and transported to the PACU in stable condition.  Ainsley Spinner, PA-C, was present and assisting throughout.  Given the depth of the infection, the two of [place] were required for retraction and expeditious care of the patient.  PROGNOSIS:  We anticipate return to the OR in 72 hours for a second look, possible wound VAC change versus closure.  He will remain on appropriate antibiotics, and we are hopeful for renewed focus and increased diligence with regard to his sugar control.   We will continue to follow with the primary service.  LN/NUANCE  D:07/17/2019 T:07/17/2019 JOB:[id number]

## 2019-07-17 NOTE — Progress Notes (Signed)
PROGRESS NOTE  Bradley Cox [id number] DOB: 03/01/1971   PCP: Simona Huh, NP  Patient is from: Home  DOA: 07/05/2019 LOS: 22  Brief Narrative / Interim history: 48 year old male with history of obesity, asthma, hypertension, GERD, nephrolithiasis, diabetes mellitus who presented to ED on 07/06/2019 with low back pain radiating to the right gluteus/thigh with progressive decline in the right lower extremity strength, difficulty ambulation over 2 to 3 weeks.  On presentation he was found to be febrile, tachycardic, tachypneic.  Blood glucose was elevated with increased anion gap.  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  Patient was started on broad-spectrum antibiotics, insulin for DKA.  Neurosurgery, ID consulted.  Patient underwent IR guided his paraspinal abscess drainage.  Blood cultures now growing MSSA. Dr. Doreatha Martin removed 500 mL from a large right-sided gluteal abscess, then performed a neuroplasty of the right sciatic nerve on 07/09/2019. Currently on cefazolin.  Patient underwent TTE on 07/07/2019 followed by TEE on 07/11/2019 which showed no vegetations and normal ejection fraction and no thrombus.  Patient had episode of nausea and vomiting the night of 9/9.  Vomiting was nonbilious.  KUB obtained and revealed marked gaseous dilation of stomach and nonobstructive gas pattern and small and large bowel concerning for ileus. NG tube was placed for decompression.  However, NG tube came out the next morning.  Patient has not had further emesis.  Passing gas but has not has bowel movement.  Repeat abdominal x-ray on 07/15/2019 shows resolved ileus.  On 07/16/2019 is right hip/buttock wound started oozing significantly. Dr. Doreatha Martin and orthopedic team was contacted.  Initially his wound was thought to be nonpurulent.  He was evaluated today on 07/17/2019 and now it is purulent and Ortho plans on doing  another IND today.    Assessment & Plan: Sepsis/MSSA bacteremia/L3-S1 epidural abscess/right iliopsoas and gluteal abscess/right lower extremity cellulitis -Lower extremity Doppler negative for DVT. -Currently on cefazolin per ID.  Plan is 6 to 8 weeks of IV Ancef.  -Blood and tissue cultures on 9/4-growing MSSA. Repeat blood culture on 9/7 negative so far. -TTE and TEE did not show any vegetation or any thrombus.  . -PICC line 9/12.-PT/OT evaluated and they recommended CIR Patient was evaluated by orthopedics yesterday and today and plan is to take him to the OR for I&D again.  Ileus/constipation/possible gastric outlet obstruction versus possible gastroparesis -Status post NG tube decompression of gastric gas.  -No further emesis.  Passing gas.  -Mobilize as able which has been difficult.  PRN medications for constipation.  Repeat abdominal KUB on 07/15/2019 does not show any ileus or SBO.  Uncontrolled NIDDM-2/DKA: DKA resolved. Hemoglobin A1c of 12.6.  Blood sugar controlled.  Continue current NPH 23 units twice daily along with 8 units NovoLog pre-meal and SSI.  Hyponatremia: Last sodium 133.  Repeat labs in the morning.  AKI:  Resolved   Hypokalemia:  Resolved.  Hypertension:   Normotensive. -Continue hydralazine scheduled and as needed.  GERD: Continue PPI  Fatty liver:  Check lipid profile.   May benefit from statins.    Morbid obesity:  BMI 42.6 Diet and exercise Further management on outpatient basis  DVT prophylaxis: Subcu Lovenox Code Status: Full code Family Communication: None present at bedside.  Plan of care discussed with patient. Disposition Plan: Remains inpatient pending clinical improvement and clearance by consultants.  Final disposition CIR early next week pending improvement Consultants: Ortho trauma,  infectious disease   Subjective: Patient seen and examined.  Complains of lower back pain and right hip pain.  Very frustrated due to the  fact that he has been n.p.o. since last night.  Objective: Vitals:   07/17/19 0329 07/17/19 0846 07/17/19 1209 07/17/19 1303  BP: 113/68 112/72 132/82   Pulse: 93 96 (!) 106 (!) 102  Resp:  10  19  Temp: 98.5 F (36.9 C) 97.9 F (36.6 C) 98.6 F (37 C)   TempSrc: Oral Oral Oral   SpO2: 100% 93% 94% 96%  Weight:      Height:        Intake/Output Summary (Last 24 hours) at 07/17/2019 1340 Last data filed at 07/17/2019 0700 Gross per 24 hour  Intake 240 ml  Output 4600 ml  Net -4360 ml   Filed Weights   07/05/19 1807 07/06/19 2116  Weight: (!) 167.4 kg (!) 164 kg    Examination:  General exam: Appears calm and comfortable, morbidly obese. Respiratory system: Clear to auscultation. Respiratory effort normal. Cardiovascular system: S1 & S2 heard, RRR. No JVD, murmurs, rubs, gallops or clicks. No pedal edema. Gastrointestinal system: Abdomen is nondistended, soft and nontender. No organomegaly or masses felt. Normal bowel sounds heard. Central nervous system: Alert and oriented.  3/10 power in right lower extremity Extremities: Symmetric 5 x 5 power in all extremities except right lower extremity where he has 3/10 power. Skin: No rashes, lesions or ulcers.  Psychiatry: Judgement and insight appear normal. Mood & affect appropriate.   Procedures:  9/6-incision and drainage of paraspinal abscess 07/09/2019 neuroplasty of the right sciatic nerve by neurosurgery 9/9-TEE  Microbiology summarized: 9/4-SARS-CoV-2 screen negative 9/4-blood and tissue cultures-MSSA 9/7-blood cultures negative.  Antimicrobials: Anti-infectives (From admission, onward)   Start     Dose/Rate Route Frequency Ordered Stop   07/10/19 1600  metroNIDAZOLE (FLAGYL) IVPB 500 mg  Status:  Discontinued     500 mg 100 mL/hr over 60 Minutes Intravenous Every 8 hours 07/10/19 1549 07/13/19 0939   07/09/19 0839  vancomycin (VANCOCIN) powder  Status:  Discontinued       As needed 07/09/19 0839 07/09/19 0935    07/09/19 0839  tobramycin (NEBCIN) powder  Status:  Discontinued       As needed 07/09/19 0840 07/09/19 0935   07/07/19 1830  ceFAZolin (ANCEF) IVPB 2g/100 mL premix     2 g 200 mL/hr over 30 Minutes Intravenous Every 8 hours 07/07/19 1333     07/07/19 0900  ceFAZolin (ANCEF) IVPB 2g/100 mL premix  Status:  Discontinued     2 g 200 mL/hr over 30 Minutes Intravenous Every 8 hours 07/07/19 0847 07/07/19 1333   07/06/19 2030  clindamycin (CLEOCIN) IVPB 600 mg  Status:  Discontinued     600 mg 100 mL/hr over 30 Minutes Intravenous Every 8 hours 07/06/19 2010 07/07/19 0847   07/06/19 1800  vancomycin (VANCOCIN) 1,500 mg in sodium chloride 0.9 % 500 mL IVPB  Status:  Discontinued     1,500 mg 250 mL/hr over 120 Minutes Intravenous Every 12 hours 07/06/19 0820 07/07/19 0847   07/06/19 1400  ceFEPIme (MAXIPIME) 2 g in sodium chloride 0.9 % 100 mL IVPB  Status:  Discontinued     2 g 200 mL/hr over 30 Minutes Intravenous Every 8 hours 07/06/19 0504 07/07/19 0847   07/06/19 1400  vancomycin (VANCOCIN) 2,000 mg in sodium chloride 0.9 % 500 mL IVPB  Status:  Discontinued     2,000 mg 250 mL/hr  over 120 Minutes Intravenous Every 8 hours 07/06/19 0504 07/06/19 0818   07/06/19 0745  clindamycin (CLEOCIN) IVPB 600 mg  Status:  Discontinued     600 mg 100 mL/hr over 30 Minutes Intravenous  Once 07/06/19 0737 07/06/19 2029   07/06/19 0500  ceFEPIme (MAXIPIME) 2 g in sodium chloride 0.9 % 100 mL IVPB     2 g 200 mL/hr over 30 Minutes Intravenous  Once 07/06/19 0451 07/06/19 1335   07/06/19 0500  vancomycin (VANCOCIN) 2,500 mg in sodium chloride 0.9 % 500 mL IVPB     2,500 mg 250 mL/hr over 120 Minutes Intravenous  Once 07/06/19 0451 07/06/19 1335      Sch Meds:  Scheduled Meds: . acetaminophen  650 mg Oral Q6H   Or  . acetaminophen  650 mg Rectal Q6H  . Chlorhexidine Gluconate Cloth  6 each Topical Daily  . enoxaparin (LOVENOX) injection  0.5 mg/kg Subcutaneous Q24H  . gabapentin  300 mg Oral TID   . hydrALAZINE  25 mg Oral BID  . insulin aspart  0-15 Units Subcutaneous TID WC  . insulin aspart  0-5 Units Subcutaneous QHS  . insulin aspart  8 Units Subcutaneous TID WC  . insulin NPH Human  23 Units Subcutaneous BID AC & HS  . metoCLOPramide  10 mg Oral TID AC & HS  . polyethylene glycol  17 g Oral BID  . senna-docusate  1 tablet Oral BID  . sodium chloride flush  10-40 mL Intracatheter Q12H  . sodium chloride flush  3 mL Intravenous Q12H   Continuous Infusions: .  ceFAZolin (ANCEF) IV 2 g (07/17/19 1029)   PRN Meds:.albuterol, fentaNYL (SUBLIMAZE) injection, hydrALAZINE, methocarbamol, ondansetron **OR** ondansetron (ZOFRAN) IV, oxyCODONE, sodium chloride flush, zolpidem   I have personally reviewed the following labs and images: CBC: Recent Labs  Lab 07/12/19 0314 07/13/19 0830 07/15/19 0504 07/17/19 0500  WBC 10.0 11.5* 12.7* 12.9*  NEUTROABS  --   --   --  10.1*  HGB 10.1* 10.4* 9.7* 9.7*  HCT 31.1* 33.4* 30.5* 29.8*  MCV 91.7 97.4 94.7 94.3  PLT 334 372 338 310   BMP &GFR Recent Labs  Lab 07/11/19 0345 07/12/19 0314 07/13/19 0830 07/15/19 0504  NA 128* 131* 131* 133*  K 3.5 3.4* 4.1 4.1  CL 87* 89* 92* 93*  CO2 32 28 29 32  GLUCOSE 247* 187* 141* 191*  BUN 5* <5* <5* <5*  CREATININE 0.63 0.59* 0.53* 0.46*  CALCIUM 8.3* 8.1* 8.1* 8.1*  MG  --  1.9 1.9 1.8   Estimated Creatinine Clearance: 188.1 mL/min (A) (by C-G formula based on SCr of 0.46 mg/dL (L)). Liver & Pancreas: No results for input(s): AST, ALT, ALKPHOS, BILITOT, PROT, ALBUMIN in the last 168 hours. No results for input(s): LIPASE, AMYLASE in the last 168 hours. No results for input(s): AMMONIA in the last 168 hours. Diabetic: No results for input(s): HGBA1C in the last 72 hours. Recent Labs  Lab 07/16/19 1202 07/16/19 1719 07/16/19 2135 07/17/19 0843 07/17/19 1208  GLUCAP 174* 138* 179* 176* 140*   Cardiac Enzymes: No results for input(s): CKTOTAL, CKMB, CKMBINDEX, TROPONINI in  the last 168 hours. No results for input(s): PROBNP in the last 8760 hours. Coagulation Profile: No results for input(s): INR, PROTIME in the last 168 hours. Thyroid Function Tests: No results for input(s): TSH, T4TOTAL, FREET4, T3FREE, THYROIDAB in the last 72 hours. Lipid Profile: No results for input(s): CHOL, HDL, LDLCALC, TRIG, CHOLHDL, LDLDIRECT in the last  72 hours. Anemia Panel: No results for input(s): VITAMINB12, FOLATE, FERRITIN, TIBC, IRON, RETICCTPCT in the last 72 hours. Urine analysis:    Component Value Date/Time   COLORURINE YELLOW 07/06/2019 [place] 07/06/2019 0437   LABSPEC 1.024 07/06/2019 0437   PHURINE 5.0 07/06/2019 0437   GLUCOSEU >=500 (A) 07/06/2019 0437   HGBUR MODERATE (A) 07/06/2019 0437   BILIRUBINUR NEGATIVE 07/06/2019 0437   KETONESUR 80 (A) 07/06/2019 0437   PROTEINUR NEGATIVE 07/06/2019 0437   NITRITE NEGATIVE 07/06/2019 0437   LEUKOCYTESUR NEGATIVE 07/06/2019 0437   Sepsis Labs: Invalid input(s): PROCALCITONIN, [place]  Microbiology: Recent Results (from the past 240 hour(s))  Aerobic/Anaerobic Culture (surgical/deep wound)     Status: None   Collection Time: 07/09/19  8:32 AM   Specimen: Abscess  Result Value Ref Range Status   Specimen Description ABSCESS HIP RIGHT  Final   Special Requests NONE  Final   Gram Stain   Final    MODERATE WBC PRESENT,BOTH PMN AND MONONUCLEAR ABUNDANT GRAM POSITIVE COCCI IN PAIRS IN CLUSTERS    Culture   Final    FEW STAPHYLOCOCCUS AUREUS NO ANAEROBES ISOLATED Performed at [facility], [address] [place], [place] [address]    Report Status 07/14/2019 FINAL  Final   Organism ID, Bacteria STAPHYLOCOCCUS AUREUS  Final      Susceptibility   Staphylococcus aureus - MIC*    CIPROFLOXACIN <=0.5 SENSITIVE Sensitive     ERYTHROMYCIN >=8 RESISTANT Resistant     GENTAMICIN <=0.5 SENSITIVE Sensitive     OXACILLIN <=0.25 SENSITIVE Sensitive     TETRACYCLINE >=16 RESISTANT  Resistant     VANCOMYCIN 1 SENSITIVE Sensitive     TRIMETH/SULFA <=10 SENSITIVE Sensitive     CLINDAMYCIN RESISTANT Resistant     RIFAMPIN <=0.5 SENSITIVE Sensitive     Inducible Clindamycin POSITIVE Resistant     * FEW STAPHYLOCOCCUS AUREUS  Culture, blood (routine x 2)     Status: None   Collection Time: 07/09/19 12:57 PM   Specimen: BLOOD RIGHT HAND  Result Value Ref Range Status   Specimen Description BLOOD RIGHT HAND  Final   Special Requests   Final    AEROBIC BOTTLE ONLY Blood Culture results may not be optimal due to an inadequate volume of blood received in culture bottles   Culture   Final    NO GROWTH 5 DAYS Performed at [facility], [place] [address], [place], [place] [address]    Report Status 07/14/2019 FINAL  Final  Culture, blood (routine x 2)     Status: None   Collection Time: 07/09/19  1:06 PM   Specimen: BLOOD RIGHT HAND  Result Value Ref Range Status   Specimen Description BLOOD RIGHT HAND  Final   Special Requests AEROBIC BOTTLE ONLY Blood Culture adequate volume  Final   Culture   Final    NO GROWTH 5 DAYS Performed at [facility], [place] [address], [place], [place] [address]    Report Status 07/14/2019 FINAL  Final    Radiology Studies: No results found.  Ishmael Holter, M.D. [facility]  If 7PM-7AM, please contact night-coverage [email or web address] Password [email or web address] 07/17/2019, 1:40 PM

## 2019-07-17 NOTE — Transfer of Care (Signed)
Immediate Anesthesia Transfer of Care Note  Patient: Bradley Cox  Procedure(s) Performed: IRRIGATION AND DEBRIDEMENT HIP (Right Hip) Application Of Wound Vac (Right )  Patient Location: PACU  Anesthesia Type:General  Level of Consciousness: awake, sedated and patient cooperative  Airway & Oxygen Therapy: Patient connected to face mask oxygen  Post-op Assessment: Report given to RN and Post -op Vital signs reviewed and stable  Post vital signs: Reviewed and stable  Last Vitals:  Vitals Value Taken Time  BP 126/84 07/17/19 2015  Temp    Pulse 90 07/17/19 2019  Resp 17 07/17/19 2019  SpO2 100 % 07/17/19 2019  Vitals shown include unvalidated device data.  Last Pain:  Vitals:   07/17/19 1209  TempSrc: Oral  PainSc:       Patients Stated Pain Goal: 0 ([id number])  Complications: No apparent anesthesia complications

## 2019-07-17 NOTE — Anesthesia Procedure Notes (Signed)
Procedure Name: Intubation Date/Time: 07/17/2019 6:58 PM Performed by: Valetta Fuller, CRNA Pre-anesthesia Checklist: Patient identified, Emergency Drugs available, Suction available and Patient being monitored Patient Re-evaluated:Patient Re-evaluated prior to induction Oxygen Delivery Method: Circle system utilized Preoxygenation: Pre-oxygenation with 100% oxygen Induction Type: IV induction, Rapid sequence and Cricoid Pressure applied Laryngoscope Size: Miller and 2 Grade View: Grade I Tube type: Oral Tube size: 7.5 mm Number of attempts: 1 Airway Equipment and Method: Stylet Placement Confirmation: ETT inserted through vocal cords under direct vision,  positive ETCO2 and breath sounds checked- equal and bilateral Secured at: 23 cm Tube secured with: Tape Dental Injury: Teeth and Oropharynx as per pre-operative assessment

## 2019-07-17 NOTE — Anesthesia Postprocedure Evaluation (Signed)
Anesthesia Post Note  Patient: Bradley Cox  Procedure(s) Performed: IRRIGATION AND DEBRIDEMENT HIP (Right Hip) Application Of Wound Vac (Right )     Patient location during evaluation: PACU Anesthesia Type: General Level of consciousness: awake and alert Pain management: pain level controlled Vital Signs Assessment: post-procedure vital signs reviewed and stable Respiratory status: spontaneous breathing, nonlabored ventilation and respiratory function stable Cardiovascular status: blood pressure returned to baseline and stable Postop Assessment: no apparent nausea or vomiting Anesthetic complications: no    Last Vitals:  Vitals:   07/17/19 2030 07/17/19 2041  BP: 136/78 135/80  Pulse: 90 94  Resp: 17 20  Temp:  37.1 C  SpO2: 100% 95%                   Audry Pili

## 2019-07-17 NOTE — Progress Notes (Signed)
PT working with patient- unable to complete assessment. CSW will try again later today.  Thurmond Butts, MSW, [place] Social Worker [phone]

## 2019-07-17 NOTE — Brief Op Note (Signed)
07/17/2019  8:12 PM  PATIENT:  Jill Poling  48 y.o. male  PRE-OPERATIVE DIAGNOSIS:  Right gluteal abscess  POST-OPERATIVE DIAGNOSIS:  Right gluteal abscess  PROCEDURE:  Procedure(s): 1. INCISION AND DRAINAGE OF RIGHT DEEP ABSCESS HIP (Right) 2. INSERTION OF ANTIBIOTIC BEADS VANC AND GENT 3. Application Of Wound Vac (Right)  SURGEON:  Surgeon(s) and Role:    Altamese Fox Chase, MD - Primary  PHYSICIAN ASSISTANT: Ainsley Spinner, PA-C  ANESTHESIA:   general  EBL:  25 mL   BLOOD ADMINISTERED:none  DRAINS: wound vac   LOCAL MEDICATIONS USED:  NONE  SPECIMEN:  No Specimen  DISPOSITION OF SPECIMEN:  N/A  COUNTS:  YES  TOURNIQUET:  * No tourniquets in log *  DICTATION: .Other Dictation: Dictation Number [id number]  PLAN OF CARE: Admit to inpatient   PATIENT DISPOSITION:  PACU - hemodynamically stable.   Delay start of Pharmacological VTE agent (>24hrs) due to surgical blood loss or risk of bleeding: no

## 2019-07-18 ENCOUNTER — Encounter (HOSPITAL_COMMUNITY): Payer: Self-pay | Admitting: Orthopedic Surgery

## 2019-07-18 LAB — COMPREHENSIVE METABOLIC PANEL
ALT: 15 U/L (ref 0–44)
AST: 19 U/L (ref 15–41)
Albumin: 1.5 g/dL — ABNORMAL LOW (ref 3.5–5.0)
Alkaline Phosphatase: 64 U/L (ref 38–126)
Anion gap: 10 (ref 5–15)
BUN: <5 mg/dL — ABNORMAL LOW (ref 6–20)
CO2: 30 mmol/L (ref 22–32)
Calcium: 8.4 mg/dL — ABNORMAL LOW (ref 8.9–10.3)
Chloride: 93 mmol/L — ABNORMAL LOW (ref 98–111)
Creatinine, Ser: 0.56 mg/dL — ABNORMAL LOW (ref 0.61–1.24)
GFR calc Af Amer: >60 mL/min (ref >60)
GFR calc non Af Amer: >60 mL/min (ref >60)
Glucose, Bld: 133 mg/dL — ABNORMAL HIGH (ref 70–99)
Potassium: 4.1 mmol/L (ref 3.5–5.1)
Sodium: 133 mmol/L — ABNORMAL LOW (ref 135–145)
Total Bilirubin: 0.4 mg/dL (ref 0.3–1.2)
Total Protein: 6 g/dL — ABNORMAL LOW (ref 6.5–8.1)

## 2019-07-18 LAB — CBC WITH DIFFERENTIAL/PLATELET
Abs Immature Granulocytes: 0.09 10*3/uL — ABNORMAL HIGH (ref 0.00–0.07)
Basophils Absolute: 0 10*3/uL (ref 0.0–0.1)
Basophils Relative: 0 %
Eosinophils Absolute: 0.1 10*3/uL (ref 0.0–0.5)
Eosinophils Relative: 1 %
HCT: 31.8 % — ABNORMAL LOW (ref 39.0–52.0)
Hemoglobin: 9.8 g/dL — ABNORMAL LOW (ref 13.0–17.0)
Immature Granulocytes: 1 %
Lymphocytes Relative: 14 %
Lymphs Abs: 1.6 10*3/uL (ref 0.7–4.0)
MCH: 29.5 pg (ref 26.0–34.0)
MCHC: 30.8 g/dL (ref 30.0–36.0)
MCV: 95.8 fL (ref 80.0–100.0)
Monocytes Absolute: 0.7 10*3/uL (ref 0.1–1.0)
Monocytes Relative: 6 %
Neutro Abs: 8.9 10*3/uL — ABNORMAL HIGH (ref 1.7–7.7)
Neutrophils Relative %: 78 %
Platelets: 301 10*3/uL (ref 150–400)
RBC: 3.32 MIL/uL — ABNORMAL LOW (ref 4.22–5.81)
RDW: 13.2 % (ref 11.5–15.5)
WBC: 11.5 10*3/uL — ABNORMAL HIGH (ref 4.0–10.5)
nRBC: 0 % (ref 0.0–0.2)

## 2019-07-18 LAB — GLUCOSE, CAPILLARY
Glucose-Capillary: 110 mg/dL — ABNORMAL HIGH (ref 70–99)
Glucose-Capillary: 195 mg/dL — ABNORMAL HIGH (ref 70–99)
Glucose-Capillary: 135 mg/dL — ABNORMAL HIGH (ref 70–99)
Glucose-Capillary: 168 mg/dL — ABNORMAL HIGH (ref 70–99)

## 2019-07-18 LAB — MAGNESIUM: Magnesium: 1.8 mg/dL (ref 1.7–2.4)

## 2019-07-18 MED ORDER — METHOCARBAMOL 500 MG PO TABS: 500.0000 mg | ORAL_TABLET | Freq: Three times a day (TID) | ORAL | Status: DC

## 2019-07-18 MED ORDER — ACETAMINOPHEN 650 MG RE SUPP: 650.0000 mg | Freq: Two times a day (BID) | RECTAL | Status: DC

## 2019-07-18 MED ORDER — HYDROCODONE-ACETAMINOPHEN 7.5-325 MG PO TABS
1.0000 | ORAL_TABLET | Freq: Four times a day (QID) | ORAL | Status: DC | PRN
  Administered 2019-07-18 – 2019-07-22 (×15): 2 via ORAL
  Administered 2019-07-23: 1 via ORAL
  Administered 2019-07-23 – 2019-07-24 (×3): 2 via ORAL
  Administered 2019-07-24: 1 via ORAL
  Administered 2019-07-24 – 2019-07-29 (×13): 2 via ORAL
  Filled 2019-07-18 (×11): qty 2
  Filled 2019-07-18: qty 1
  Filled 2019-07-18 (×21): qty 2

## 2019-07-18 MED ORDER — METHOCARBAMOL 750 MG PO TABS
750.0000 mg | ORAL_TABLET | Freq: Three times a day (TID) | ORAL | Status: DC
  Administered 2019-07-18 – 2019-08-16 (×87): 750 mg via ORAL
  Filled 2019-07-18 (×87): qty 1

## 2019-07-18 MED ORDER — ACETAMINOPHEN 500 MG PO TABS
500.0000 mg | ORAL_TABLET | Freq: Two times a day (BID) | ORAL | Status: DC
  Administered 2019-07-18 – 2019-07-29 (×21): 500 mg via ORAL
  Filled 2019-07-18 (×21): qty 1

## 2019-07-18 NOTE — NC FL2 (Signed)
MEDICAID FL2 LEVEL OF CARE SCREENING TOOL     IDENTIFICATION  Patient Name: Bradley Cox Birthdate: Oct 27, 1971 Sex: male Admission Date (Current Location): 07/05/2019  [facility] and [place] Number:  Herbalist and Address:  [facility] [place], [place] [address] [place], [place] [address]      Provider Number: [id number]  Attending Physician Name and Address:  Darliss Cheney, MD  Relative Name and Phone Number:  Jearl Klinefelter [phone]    Current Level of Care: Hospital Recommended Level of Care: [place] Prior Approval Number:    Date Approved/Denied:   PASRR Number: [id number]  Discharge Plan: SNF    Current Diagnoses: Patient Active Problem List   Diagnosis Date Noted  . MSSA bacteremia 07/10/2019  . Abscess of right hip 07/09/2019  . Paraspinal abscess ([place]) 07/09/2019  . 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 Sepsis ([place]) 07/06/2019  . AKI (acute kidney injury) ([place]) 07/06/2019  . Hyperkalemia 07/06/2019  . Obesity, Class III, BMI 40-49.9 (morbid obesity) ([place]) 01/21/2009  . GERD 01/21/2009  . DYSPHAGIA 01/21/2009  . Essential hypertension 01/07/2009  . ASTHMA 01/07/2009    Orientation RESPIRATION BLADDER Height & Weight     Self, Time, Situation, Place  Normal External catheter, Continent Weight: (!) 361 lb 8.9 oz (164 kg) Height:  6\' 6"  (198.1 cm)  BEHAVIORAL SYMPTOMS/MOOD NEUROLOGICAL BOWEL NUTRITION STATUS      Continent Diet(please see discharge summary)  AMBULATORY STATUS COMMUNICATION OF NEEDS Skin     Verbally Surgical wounds, Wound Vac(incision(closed)back,lower right, incision(closed)thigh right, incision(closed) hip right, negative pressure wound therapy right,lateral)                       Personal Care Assistance Level of Assistance  Bathing, Feeding, Dressing Bathing Assistance: Limited assistance Feeding assistance: Independent Dressing Assistance: Limited  assistance     Functional Limitations Info  Sight, Hearing, Speech Sight Info: Adequate Hearing Info: Adequate Speech Info: Adequate    SPECIAL CARE FACTORS FREQUENCY  PT (By licensed PT), OT (By licensed OT)     PT Frequency: 5x per wek OT Frequency: 5x per week            Contractures Contractures Info: Not present    Additional Factors Info  Code Status, Allergies Code Status Info: FULL Allergies Info: Penicillins,Tramadol           Current Medications (07/18/2019):  This is the current hospital active medication list Current Facility-Administered Medications  Medication Dose Route Frequency Provider Last Rate Last Dose  . acetaminophen (TYLENOL) tablet 500 mg  500 mg Oral Q12H Ainsley Spinner, PA-C       Or  . acetaminophen (TYLENOL) suppository 650 mg  650 mg Rectal Q12H Ainsley Spinner, PA-C      . albuterol (PROVENTIL) (2.5 MG/3ML) 0.083% nebulizer solution 2.5 mg  2.5 mg Nebulization Q6H PRN Ainsley Spinner, PA-C      . ceFAZolin (ANCEF) IVPB 2g/100 mL premix  2 g Intravenous Q8H Ainsley Spinner, PA-C 200 mL/hr at 07/18/19 1116 2 g at 07/18/19 1116  . Chlorhexidine Gluconate Cloth 2 % PADS 6 each  6 each Topical Daily Ainsley Spinner, PA-C   6 each at 07/18/19 1114  . enoxaparin (LOVENOX) injection 80 mg  0.5 mg/kg Subcutaneous Q24H Ainsley Spinner, PA-C   80 mg at 07/18/19 1114  . fentaNYL (SUBLIMAZE) injection 50-75 mcg  50-75 mcg Intravenous Q4H PRN Ainsley Spinner, PA-C   75 mcg  at 07/18/19 1139  . gabapentin (NEURONTIN) capsule 300 mg  300 mg Oral TID Ainsley Spinner, PA-C   300 mg at 07/18/19 [id number]  . hydrALAZINE (APRESOLINE) injection 10 mg  10 mg Intravenous Q6H PRN Ainsley Spinner, PA-C      . hydrALAZINE (APRESOLINE) tablet 25 mg  25 mg Oral BID Ainsley Spinner, PA-C   25 mg at 07/18/19 [id number]  . HYDROcodone-acetaminophen (NORCO) 7.5-325 MG per tablet 1-2 tablet  1-2 tablet Oral Q6H PRN Ainsley Spinner, PA-C   2 tablet at 07/18/19 [id number]  . insulin aspart (novoLOG) injection 0-15 Units  0-15 Units  Subcutaneous TID WC Ainsley Spinner, PA-C   3 Units at 07/18/19 1316  . insulin aspart (novoLOG) injection 0-5 Units  0-5 Units Subcutaneous QHS Ainsley Spinner, PA-C   2 Units at 07/14/19 2231  . insulin aspart (novoLOG) injection 8 Units  8 Units Subcutaneous TID WC Ainsley Spinner, PA-C   8 Units at 07/18/19 1316  . insulin NPH Human (NOVOLIN N) injection 23 Units  23 Units Subcutaneous BID AC & HS Ainsley Spinner, PA-C   23 Units at 07/18/19 1138  . lactated ringers infusion   Intravenous Continuous Ainsley Spinner, PA-C      . methocarbamol (ROBAXIN) tablet 750 mg  750 mg Oral TID Ainsley Spinner, PA-C   750 mg at 07/18/19 1115  . metoCLOPramide (REGLAN) 5 MG/5ML solution 10 mg  10 mg Oral TID AC & HS Ainsley Spinner, PA-C   10 mg at 07/18/19 1114  . ondansetron (ZOFRAN) tablet 4 mg  4 mg Oral Q6H PRN Ainsley Spinner, PA-C       Or  . ondansetron [facility]) injection 4 mg  4 mg Intravenous Q6H PRN Ainsley Spinner, PA-C   4 mg at 07/11/19 2034  . polyethylene glycol (MIRALAX / GLYCOLAX) packet 17 g  17 g Oral BID Ainsley Spinner, PA-C   17 g at 07/16/19 2134  . senna-docusate (Senokot-S) tablet 1 tablet  1 tablet Oral BID Ainsley Spinner, PA-C   1 tablet at 07/16/19 2135  . sodium chloride flush (NS) 0.9 % injection 10-40 mL  10-40 mL Intracatheter Q12H Ainsley Spinner, PA-C   10 mL at 07/18/19 1004  . sodium chloride flush (NS) 0.9 % injection 10-40 mL  10-40 mL Intracatheter PRN Ainsley Spinner, PA-C      . sodium chloride flush (NS) 0.9 % injection 3 mL  3 mL Intravenous Q12H Ainsley Spinner, PA-C   3 mL at 07/18/19 1005  . zolpidem (AMBIEN) tablet 5 mg  5 mg Oral QHS PRN Ainsley Spinner, PA-C   5 mg at 07/17/19 2109     Discharge Medications: Please see discharge summary for a list of discharge medications.  Relevant Imaging Results:  Relevant Lab Results:   Additional Information SSN # [id number]  Vinie Sill, LCSWA

## 2019-07-18 NOTE — Progress Notes (Signed)
Physical Therapy Treatment Patient Details Name: Bradley Cox MRN: [id number] DOB: April 03, 1971 Today's Date: 07/18/2019    History of Present Illness Patient is a 48 y/o male who presents with right flank pain. Admitted with disseminated MSSA infection including bacteremia, Lumbosacral spinal epidural abscess, paraspinal/large gluteal/pelvic abscess with sciatica impingement. s/p I&D large gluteal abscess and neuroplasty of sciatica nerve 9/7. s/p CT aspiration right paraspinal abscess. PMH includes DM, HTN, morbid obesity, OSA.    PT Comments    Patient with limited tolerance and only agreeable to in bed therex this session due to pain and increased wound drainage despite wound vac.  Will continue skilled PT to progress as tolerated.    Follow Up Recommendations  CIR;Supervision for mobility/OOB;Supervision/Assistance - 24 hour     Equipment Recommendations  Rolling walker with 5" wheels;3in1 (PT)    Recommendations for Other Services       Precautions / Restrictions Precautions Precautions: Fall Precaution Comments: wound vac Restrictions RLE Weight Bearing: Weight bearing as tolerated    Mobility  Bed Mobility               General bed mobility comments: declined OOB due to draining a lot on R hip wound  Transfers                    Ambulation/Gait                 Stairs             Wheelchair Mobility    Modified Rankin (Stroke Patients Only)       Balance                                            Cognition Arousal/Alertness: Awake/alert Behavior During Therapy: WFL for tasks assessed/performed Overall Cognitive Status: Within Functional Limits for tasks assessed                                        Exercises General Exercises - Lower Extremity Ankle Circles/Pumps: Right;10 reps;Supine Short Arc Quad: Right;10 reps;Supine Heel Slides: AAROM;10 reps;Supine Hip ABduction/ADduction:  AROM;AAROM;5 reps;Supine Straight Leg Raises: AROM;AAROM;10 reps;Supine Toe Raises: AROM;Other reps (comment);Both    General Comments        Pertinent Vitals/Pain Pain Assessment: Faces Faces Pain Scale: Hurts whole lot Pain Location: RLE Pain Descriptors / Indicators: Constant;Discomfort;Grimacing Pain Intervention(s): Monitored during session;Repositioned;Patient requesting pain meds-RN notified;Limited activity within patient's tolerance    Home Living                      Prior Function            PT Goals (current goals can now be found in the care plan section) Progress towards PT goals: Not progressing toward goals - comment(limited to in bed due to draining from wound)    Frequency    Min 3X/week      PT Plan Current plan remains appropriate    Co-evaluation              AM-PAC PT "6 Clicks" Mobility   Outcome Measure  Help needed turning from your back to your side while in a flat bed without using bedrails?: A Little Help needed moving from lying on your back  to sitting on the side of a flat bed without using bedrails?: A Little Help needed moving to and from a bed to a chair (including a wheelchair)?: A Lot Help needed standing up from a chair using your arms (e.g., wheelchair or bedside chair)?: A Lot Help needed to walk in hospital room?: A Lot Help needed climbing 3-5 steps with a railing? : Total 6 Click Score: 13    End of Session   Activity Tolerance: Patient limited by pain Patient left: in bed;with call bell/phone within reach;with family/visitor present   PT Visit Diagnosis: Muscle weakness (generalized) (M62.81);Pain;Unsteadiness on feet (R26.81);Difficulty in walking, not elsewhere classified (R26.2) Pain - Right/Left: Right Pain - part of body: Leg     Time: [id number] PT Time Calculation (min) (ACUTE ONLY): 11 min  Charges:  $Therapeutic Exercise: 8-22 mins                     Magda Kiel, [place] [facility] [phone] 07/18/2019    Reginia Naas 07/18/2019, 5:17 PM

## 2019-07-18 NOTE — TOC Initial Note (Signed)
Transition of Care [facility]) - Initial/Assessment Note    Patient Details  Name: Bradley Cox MRN: [id number] Date of Birth: Oct 22, 1971  Transition of Care [facility]) CM/SW Contact:    Vinie Sill, [place] Phone Number: 07/18/2019, 1:07 PM  Clinical Narrative:                  CSW met with the patient at bedside. He was alert and oriented. Patient mother was present in the room. CSW introduced self and explained role. Patient states he lives in the home with his son and two other people. CSW discussed PT recommendation of ST rehab before returning home , and possible SNF as back up plan to inpatient rehab. Patient recognized the need for rehab and was agreeable to SNF placement if declined by CIR. Patient states once he his discharged from rehab he is going to stay temporarily with his father, Jori Moll in [place]. Patient states no SNF preference but requested the [place] and [place] area. Patient and family states no questions or concerns at this time.   Patient gave permission to contact his father, Jori Moll [phone]. CSW left voice mail to return call.   Thurmond Butts, MSW, [place] Clinical Social Worker [phone]     Barriers to Discharge: Continued Medical Work up, Inadequate or no insurance, No SNF bed, SNF Pending bed offer   Patient Goals and CMS Choice Patient states their goals for this hospitalization and ongoing recovery are:: back to where I was before I came to the hospital      Expected Discharge Plan and Services   In-house Referral: Clinical Social Work     Living arrangements for the past 2 months: Single Family Home                                      Prior Living Arrangements/Services Living arrangements for the past 2 months: Single Family Home Lives with:: Self, Adult Children Patient language and need for interpreter reviewed:: Yes Do you feel safe going back to the place where you live?: No   patgient states believes he needs the rehab   Need for Family Participation in Patient Care: Yes (Comment)     Criminal Activity/Legal Involvement Pertinent to Current Situation/Hospitalization: No - Comment as needed  Activities of Daily Living      Permission Sought/Granted Permission sought to share information with : Family Supports Permission granted to share information with : Yes, Verbal Permission Granted  Share Information with NAME: Debie Simmers  Permission granted to share info w AGENCY: SNFs  Permission granted to share info w Relationship: mother  Permission granted to share info w Contact Information: [phone]  Emotional Assessment Appearance:: Appears stated age Attitude/Demeanor/Rapport: Engaged Affect (typically observed): Accepting Orientation: : Oriented to Self, Oriented to Place, Oriented to  Time, Oriented to Situation Alcohol / Substance Use: Not Applicable Psych Involvement: No (comment)  Admission diagnosis:  Spinal epidural abscess [G06.1] Back pain [M54.9] Abscess of right hip [L02.415] Diabetic ketoacidosis without coma associated with other specified diabetes mellitus ([place]) [E13.10] Sepsis, due to unspecified organism, unspecified whether acute organ dysfunction present ([place]) [A41.9] Sepsis ([place]) [A41.9] Patient Active Problem List   Diagnosis Date Noted  . MSSA bacteremia 07/10/2019  . Abscess of right hip 07/09/2019  . Paraspinal abscess ([place]) 07/09/2019  . 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 Sepsis ([place]) 07/06/2019  . AKI (acute kidney injury) ([place]) 07/06/2019  .  Hyperkalemia 07/06/2019  . Obesity, Class III, BMI 40-49.9 (morbid obesity) ([place]) 01/21/2009  . GERD 01/21/2009  . DYSPHAGIA 01/21/2009  . Essential hypertension 01/07/2009  . ASTHMA 01/07/2009   PCP:  Simona Huh, NP Pharmacy:   [facility] [address] [place] - [address] [place] [address] Phone: [phone] Fax: [phone]     Social  Determinants of Health (SDOH) Interventions    Readmission Risk Interventions No flowsheet data found.

## 2019-07-18 NOTE — Progress Notes (Signed)
Inpatient Rehab Admissions Coordinator:   Met with pt and his mother at bedside. Continues to progress slowly with mobility.  Will recommend SNF at this time, and pt in agreement with that plan.  Will sign off at let CSW know.   Shann Medal, PT, DPT Admissions Coordinator [phone] 07/18/19  2:50 PM

## 2019-07-18 NOTE — Progress Notes (Signed)
Orthopedic Trauma Service Progress Note  Patient ID: Bradley Cox MRN: [id number] DOB/AGE: 1971-09-08 48 y.o.  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   Weight as of this encounter: 164 kg.   Intake/Output      09/15 0701 - 09/16 0700 09/16 0701 - 09/17 0700   P.O.     I.V. (mL/kg) 1380 (8.4)    Total Intake(mL/kg) 1380 (8.4)    Urine (mL/kg/hr) 550 (0.1)    Drains 150    Stool     Blood 25    Total Output 725    Net +655           LABS  Results for orders placed or performed during the hospital encounter of 07/05/19 (from the past 24 hour(s))  Glucose, capillary     Status: Abnormal   Collection Time: 07/17/19 12:08 PM  Result Value Ref Range   Glucose-Capillary 140 (H) 70 - 99 mg/dL  Glucose, capillary     Status: Abnormal   Collection Time: 07/17/19  2:36 PM  Result Value Ref Range   Glucose-Capillary 143 (H) 70 - 99 mg/dL  Glucose, capillary     Status: Abnormal   Collection Time: 07/17/19  4:40 PM  Result Value Ref Range   Glucose-Capillary 106 (H) 70 - 99 mg/dL  Glucose, capillary     Status: None   Collection Time: 07/17/19  8:15 PM  Result Value Ref Range   Glucose-Capillary 88 70 - 99  mg/dL  Glucose, capillary     Status: Abnormal   Collection Time: 07/17/19  9:20 PM  Result Value Ref Range   Glucose-Capillary 112 (H) 70 - 99 mg/dL  CBC with Differential/Platelet     Status: Abnormal   Collection Time: 07/18/19  4:22 AM  Result Value Ref Range   WBC 11.5 (H) 4.0 - 10.5 K/uL   RBC 3.32 (L) 4.22 - 5.81 MIL/uL   Hemoglobin 9.8 (L) 13.0 - 17.0 g/dL   HCT 31.8 (L) 39.0 - 52.0 %   MCV 95.8 80.0 - 100.0 fL   MCH 29.5 26.0 - 34.0 pg   MCHC 30.8 30.0 - 36.0 g/dL   RDW 13.2 11.5 - 15.5 %   Platelets 301 150 - 400 K/uL   nRBC 0.0 0.0 - 0.2 %   Neutrophils Relative % 78 %   Neutro Abs 8.9 (H) 1.7 - 7.7 K/uL  Lymphocytes Relative 14 %   Lymphs Abs 1.6 0.7 - 4.0 K/uL   Monocytes Relative 6 %   Monocytes Absolute 0.7 0.1 - 1.0 K/uL   Eosinophils Relative 1 %   Eosinophils Absolute 0.1 0.0 - 0.5 K/uL   Basophils Relative 0 %   Basophils Absolute 0.0 0.0 - 0.1 K/uL   Immature Granulocytes 1 %   Abs Immature Granulocytes 0.09 (H) 0.00 - 0.07 K/uL  Comprehensive metabolic panel     Status: Abnormal   Collection Time: 07/18/19  4:22 AM  Result Value Ref Range   Sodium 133 (L) 135 - 145 mmol/L   Potassium 4.1 3.5 - 5.1 mmol/L   Chloride 93 (L) 98 - 111 mmol/L   CO2 30 22 - 32 mmol/L   Glucose, Bld 133 (H) 70 - 99 mg/dL   BUN <5 (L) 6 - 20 mg/dL   Creatinine, Ser 0.56 (L) 0.61 - 1.24 mg/dL   Calcium 8.4 (L) 8.9 - 10.3 mg/dL   Total Protein 6.0 (L) 6.5 - 8.1 g/dL   Albumin 1.5 (L) 3.5 - 5.0 g/dL   AST 19 15 - 41 U/L   ALT 15 0 - 44 U/L   Alkaline Phosphatase 64 38 - 126 U/L   Total Bilirubin 0.4 0.3 - 1.2 mg/dL   GFR calc non Af Amer >60 >60 mL/min   GFR calc Af Amer >60 >60 mL/min   Anion gap 10 5 - 15  Magnesium     Status: None   Collection Time: 07/18/19  4:22 AM  Result Value Ref Range   Magnesium 1.8 1.7 - 2.4 mg/dL  Glucose, capillary     Status: Abnormal   Collection Time: 07/18/19  8:03 AM  Result Value Ref Range   Glucose-Capillary 110 (H) 70 - 99  mg/dL     PHYSICAL EXAM:   Gen:  Resting comfortably in bed, NAD, appears well  Lungs: unlabored Cardiac: reg Ext:      Right Lower Extremity   R hip wound vac functioning   Good seal   Sanguinous drainage in canister, no purulence noted  Swelling stable R leg  DPN, SPN, TN sensation diminished  Weak EHL and ankle extension but ankle and toe ROM grossly intact   Pt does have active control   No DCT   Compartments are soft   Assessment/Plan: 1 Day Post-Op   Principal Problem:   MSSA bacteremia Active Problems:   Obesity, Class III, BMI 40-49.9 (morbid obesity) (HCC)   Essential hypertension   Sepsis (HCC)   AKI (acute kidney injury) (HCC)   Hyperkalemia   Abscess of right hip   Paraspinal abscess (HCC)   Compression of right sciatic nerve   Anti-infectives (From admission, onward)   Start     Dose/Rate Route Frequency Ordered Stop   07/17/19 1857  gentamicin (GARAMYCIN) injection  Status:  Discontinued       As needed 07/17/19 1857 07/17/19 2009   07/17/19 1857  vancomycin (VANCOCIN) powder  Status:  Discontinued       As needed 07/17/19 1858 07/17/19 2009   07/10/19 1600  metroNIDAZOLE (FLAGYL) IVPB 500 mg  Status:  Discontinued     500 mg 100 mL/hr over 60 Minutes Intravenous Every 8 hours 07/10/19 1549 07/13/19 0939   07/09/19 0839  vancomycin (VANCOCIN) powder  Status:  Discontinued       As needed 07/09/19 0839 07/09/19 0935   07/09/19 0839  tobramycin (NEBCIN) powder  Status:  Discontinued  As needed 07/09/19 0840 07/09/19 0935   07/07/19 1830  ceFAZolin (ANCEF) IVPB 2g/100 mL premix     2 g 200 mL/hr over 30 Minutes Intravenous Every 8 hours 07/07/19 1333     07/07/19 0900  ceFAZolin (ANCEF) IVPB 2g/100 mL premix  Status:  Discontinued     2 g 200 mL/hr over 30 Minutes Intravenous Every 8 hours 07/07/19 0847 07/07/19 1333   07/06/19 2030  clindamycin (CLEOCIN) IVPB 600 mg  Status:  Discontinued     600 mg 100 mL/hr over 30 Minutes Intravenous Every  8 hours 07/06/19 2010 07/07/19 0847   07/06/19 1800  vancomycin (VANCOCIN) 1,500 mg in sodium chloride 0.9 % 500 mL IVPB  Status:  Discontinued     1,500 mg 250 mL/hr over 120 Minutes Intravenous Every 12 hours 07/06/19 0820 07/07/19 0847   07/06/19 1400  ceFEPIme (MAXIPIME) 2 g in sodium chloride 0.9 % 100 mL IVPB  Status:  Discontinued     2 g 200 mL/hr over 30 Minutes Intravenous Every 8 hours 07/06/19 0504 07/07/19 0847   07/06/19 1400  vancomycin (VANCOCIN) 2,000 mg in sodium chloride 0.9 % 500 mL IVPB  Status:  Discontinued     2,000 mg 250 mL/hr over 120 Minutes Intravenous Every 8 hours 07/06/19 0504 07/06/19 0818   07/06/19 0745  clindamycin (CLEOCIN) IVPB 600 mg  Status:  Discontinued     600 mg 100 mL/hr over 30 Minutes Intravenous  Once 07/06/19 0737 07/06/19 2029   07/06/19 0500  ceFEPIme (MAXIPIME) 2 g in sodium chloride 0.9 % 100 mL IVPB     2 g 200 mL/hr over 30 Minutes Intravenous  Once 07/06/19 0451 07/06/19 1335   07/06/19 0500  vancomycin (VANCOCIN) 2,500 mg in sodium chloride 0.9 % 500 mL IVPB     2,500 mg 250 mL/hr over 120 Minutes Intravenous  Once 07/06/19 0451 07/06/19 1335    .  POD/HD#: 1     48 y/o male with multifocal abscesses s/p I&D right gluteal abscess   - R gluteal abscess s/p serial I&D (MSSA)  Continue with VAC  WBAT R leg  Return to OR Friday for repeat I&D, VAC change vs closure depending on what soft tissue looks like  Ice prn                 - Pain management:             dc oxy IR   Try norco instead   Continue with gabapentin    - ID:              Per infectious disease   - Dispo:             return to OR Friday afternoon      Jari Pigg, PA-C [phone] (C) 07/18/2019, 9:21 AM  [facility] [place] [address] [phone] Domingo Sep (F)

## 2019-07-18 NOTE — Progress Notes (Signed)
PROGRESS NOTE  Bradley Cox [id number] DOB: 1971/09/14   PCP: Simona Huh, NP  Patient is from: Home  DOA: 07/05/2019 LOS: 88  Brief Narrative / Interim history: 48 year old male with history of obesity, asthma, hypertension, GERD, nephrolithiasis, diabetes mellitus who presented to ED on 07/06/2019 with low back pain radiating to the right gluteus/thigh with progressive decline in the right lower extremity strength, difficulty ambulation over 2 to 3 weeks.  On presentation he was found to be febrile, tachycardic, tachypneic.  Blood glucose was elevated with increased anion gap.  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  Patient was started on broad-spectrum antibiotics, insulin for DKA.  Neurosurgery, ID consulted.  Patient underwent IR guided his paraspinal abscess drainage.  Blood cultures now growing MSSA. Dr. Doreatha Martin removed 500 mL from a large right-sided gluteal abscess, then performed a neuroplasty of the right sciatic nerve on 07/09/2019. Currently on cefazolin.  Patient underwent TTE on 07/07/2019 followed by TEE on 07/11/2019 which showed no vegetations and normal ejection fraction and no thrombus.  Patient had episode of nausea and vomiting the night of 9/9.  Vomiting was nonbilious.  KUB obtained and revealed marked gaseous dilation of stomach and nonobstructive gas pattern and small and large bowel concerning for ileus. NG tube was placed for decompression.  However, NG tube came out the next morning.  Patient has not had further emesis.  Passing gas but has not has bowel movement.  Repeat abdominal x-ray on 07/15/2019 shows resolved ileus.  On 07/16/2019 is right hip/buttock wound started oozing significantly. Dr. Doreatha Martin and orthopedic team was contacted.  Initially his wound was thought to be nonpurulent.  He was evaluated again on T/15/2020 and it became purulent and Ortho took him to  the OR and did another I&D of right gluteal abscess with drainage of 300 cc purulent fluid.  Assessment & Plan: Sepsis/MSSA bacteremia/L3-S1 epidural abscess/right iliopsoas and gluteal abscess/right lower extremity cellulitis -Lower extremity Doppler negative for DVT. -Currently on cefazolin per ID.  Plan is 6 to 8 weeks of IV Ancef.  -Blood and tissue cultures on 9/4-growing MSSA. Repeat blood culture on 9/7 negative so far. -TTE and TEE did not show any vegetation or any thrombus.  . -PICC line 9/12.-PT/OT evaluated and they recommended CIR Status post another I&D of the right gluteal abscess with drainage of 300 cc purulent fluid on 07/17/2019.  Pain management per orthopedics.  Ileus/constipation/possible gastric outlet obstruction versus possible gastroparesis -Status post NG tube decompression of gastric gas.  -No further emesis.  Passing gas.  -Mobilize as able which has been difficult.  PRN medications for constipation.  Repeat abdominal KUB on 07/15/2019 does not show any ileus or SBO.  Uncontrolled NIDDM-2/DKA: DKA resolved. Hemoglobin A1c of 12.6.  Blood sugar controlled.  Continue current NPH 23 units twice daily along with 8 units NovoLog pre-meal and SSI.  Hyponatremia: Last sodium 133.  Repeat labs in the morning.  AKI:  Resolved   Hypokalemia:  Resolved.  Hypertension:   Normotensive. -Continue hydralazine scheduled and as needed.  GERD: Continue PPI  Fatty liver:  Check lipid profile.   May benefit from statins.    Morbid obesity:  BMI 42.6 Diet and exercise Further management on outpatient basis  DVT prophylaxis: Subcu Lovenox Code Status: Full code Family Communication: None present at bedside.  Plan of care discussed with patient. Disposition Plan: Remains inpatient pending clinical improvement and clearance  by consultants.  Final disposition CIR early next week pending improvement Consultants: Ortho trauma, infectious  disease   Subjective: Patient seen and examined.  Still with right hip pain but much better than yesterday.  Is able to move right lower extremity better than yesterday.  Somewhat happier today.  Objective: Vitals:   07/18/19 0022 07/18/19 0400 07/18/19 0804 07/18/19 1200  BP: 115/70 121/77 112/74   Pulse: (!) 103 (!) 110 91   Resp:  (!) 23 (!) 23   Temp: 97.8 F (36.6 C) 98.1 F (36.7 C) 98 F (36.7 C) 98.6 F (37 C)  TempSrc: Oral Oral Oral Oral  SpO2:  96% 93%   Weight:      Height:        Intake/Output Summary (Last 24 hours) at 07/18/2019 1209 Last data filed at 07/18/2019 1200 Gross per 24 hour  Intake 1380 ml  Output 1925 ml  Net -545 ml   Filed Weights   07/05/19 1807 07/06/19 2116  Weight: (!) 167.4 kg (!) 164 kg    Examination:  General exam: Appears calm and comfortable, morbidly obese Respiratory system: Clear to auscultation. Respiratory effort normal. Cardiovascular system: S1 & S2 heard, RRR. No JVD, murmurs, rubs, gallops or clicks. No pedal edema. Gastrointestinal system: Abdomen is nondistended, soft and nontender. No organomegaly or masses felt. Normal bowel sounds heard. Central nervous system: Alert and oriented. No focal neurological deficits. Extremities: Symmetric 5 x 5 power. Skin: No rashes, lesions or ulcers.  Has wound VAC attached to the right hip area. Psychiatry: Judgement and insight appear normal. Mood & affect appropriate.   Procedures:  9/6-incision and drainage of paraspinal abscess 07/09/2019 neuroplasty of the right sciatic nerve by neurosurgery 9/9-TEE  Microbiology summarized: 9/4-SARS-CoV-2 screen negative 9/4-blood and tissue cultures-MSSA 9/7-blood cultures negative.  Antimicrobials: Anti-infectives (From admission, onward)   Start     Dose/Rate Route Frequency Ordered Stop   07/17/19 1857  gentamicin (GARAMYCIN) injection  Status:  Discontinued       As needed 07/17/19 1857 07/17/19 2009   07/17/19 1857  vancomycin  (VANCOCIN) powder  Status:  Discontinued       As needed 07/17/19 1858 07/17/19 2009   07/10/19 1600  metroNIDAZOLE (FLAGYL) IVPB 500 mg  Status:  Discontinued     500 mg 100 mL/hr over 60 Minutes Intravenous Every 8 hours 07/10/19 1549 07/13/19 0939   07/09/19 0839  vancomycin (VANCOCIN) powder  Status:  Discontinued       As needed 07/09/19 0839 07/09/19 0935   07/09/19 0839  tobramycin (NEBCIN) powder  Status:  Discontinued       As needed 07/09/19 0840 07/09/19 0935   07/07/19 1830  ceFAZolin (ANCEF) IVPB 2g/100 mL premix     2 g 200 mL/hr over 30 Minutes Intravenous Every 8 hours 07/07/19 1333     07/07/19 0900  ceFAZolin (ANCEF) IVPB 2g/100 mL premix  Status:  Discontinued     2 g 200 mL/hr over 30 Minutes Intravenous Every 8 hours 07/07/19 0847 07/07/19 1333   07/06/19 2030  clindamycin (CLEOCIN) IVPB 600 mg  Status:  Discontinued     600 mg 100 mL/hr over 30 Minutes Intravenous Every 8 hours 07/06/19 2010 07/07/19 0847   07/06/19 1800  vancomycin (VANCOCIN) 1,500 mg in sodium chloride 0.9 % 500 mL IVPB  Status:  Discontinued     1,500 mg 250 mL/hr over 120 Minutes Intravenous Every 12 hours 07/06/19 0820 07/07/19 0847   07/06/19 1400  ceFEPIme (MAXIPIME)  2 g in sodium chloride 0.9 % 100 mL IVPB  Status:  Discontinued     2 g 200 mL/hr over 30 Minutes Intravenous Every 8 hours 07/06/19 0504 07/07/19 0847   07/06/19 1400  vancomycin (VANCOCIN) 2,000 mg in sodium chloride 0.9 % 500 mL IVPB  Status:  Discontinued     2,000 mg 250 mL/hr over 120 Minutes Intravenous Every 8 hours 07/06/19 0504 07/06/19 0818   07/06/19 0745  clindamycin (CLEOCIN) IVPB 600 mg  Status:  Discontinued     600 mg 100 mL/hr over 30 Minutes Intravenous  Once 07/06/19 0737 07/06/19 2029   07/06/19 0500  ceFEPIme (MAXIPIME) 2 g in sodium chloride 0.9 % 100 mL IVPB     2 g 200 mL/hr over 30 Minutes Intravenous  Once 07/06/19 0451 07/06/19 1335   07/06/19 0500  vancomycin (VANCOCIN) 2,500 mg in sodium chloride  0.9 % 500 mL IVPB     2,500 mg 250 mL/hr over 120 Minutes Intravenous  Once 07/06/19 0451 07/06/19 1335      Sch Meds:  Scheduled Meds:  acetaminophen  500 mg Oral Q12H   Or   acetaminophen  650 mg Rectal Q12H   Chlorhexidine Gluconate Cloth  6 each Topical Daily   enoxaparin (LOVENOX) injection  0.5 mg/kg Subcutaneous Q24H   gabapentin  300 mg Oral TID   hydrALAZINE  25 mg Oral BID   insulin aspart  0-15 Units Subcutaneous TID WC   insulin aspart  0-5 Units Subcutaneous QHS   insulin aspart  8 Units Subcutaneous TID WC   insulin NPH Human  23 Units Subcutaneous BID AC & HS   methocarbamol  750 mg Oral TID   metoCLOPramide  10 mg Oral TID AC & HS   polyethylene glycol  17 g Oral BID   senna-docusate  1 tablet Oral BID   sodium chloride flush  10-40 mL Intracatheter Q12H   sodium chloride flush  3 mL Intravenous Q12H   Continuous Infusions:   ceFAZolin (ANCEF) IV 2 g (07/18/19 1116)   lactated ringers     PRN Meds:.albuterol, fentaNYL (SUBLIMAZE) injection, hydrALAZINE, HYDROcodone-acetaminophen, ondansetron **OR** ondansetron (ZOFRAN) IV, sodium chloride flush, zolpidem   I have personally reviewed the following labs and images: CBC: Recent Labs  Lab 07/12/19 0314 07/13/19 0830 07/15/19 0504 07/17/19 0500 07/18/19 0422  WBC 10.0 11.5* 12.7* 12.9* 11.5*  NEUTROABS  --   --   --  10.1* 8.9*  HGB 10.1* 10.4* 9.7* 9.7* 9.8*  HCT 31.1* 33.4* 30.5* 29.8* 31.8*  MCV 91.7 97.4 94.7 94.3 95.8  PLT 334 372 338 310 301   BMP &GFR Recent Labs  Lab 07/12/19 0314 07/13/19 0830 07/15/19 0504 07/18/19 0422  NA 131* 131* 133* 133*  K 3.4* 4.1 4.1 4.1  CL 89* 92* 93* 93*  CO2 28 29 32 30  GLUCOSE 187* 141* 191* 133*  BUN <5* <5* <5* <5*  CREATININE 0.59* 0.53* 0.46* 0.56*  CALCIUM 8.1* 8.1* 8.1* 8.4*  MG 1.9 1.9 1.8 1.8   Estimated Creatinine Clearance: 188.1 mL/min (A) (by C-G formula based on SCr of 0.56 mg/dL (L)). Liver & Pancreas: Recent Labs   Lab 07/18/19 0422  AST 19  ALT 15  ALKPHOS 64  BILITOT 0.4  PROT 6.0*  ALBUMIN 1.5*   No results for input(s): LIPASE, AMYLASE in the last 168 hours. No results for input(s): AMMONIA in the last 168 hours. Diabetic: No results for input(s): HGBA1C in the last 72 hours. Recent  Labs  Lab 07/17/19 1640 07/17/19 2015 07/17/19 2120 07/18/19 0803 07/18/19 1157  GLUCAP 106* 88 112* 110* 195*   Cardiac Enzymes: No results for input(s): CKTOTAL, CKMB, CKMBINDEX, TROPONINI in the last 168 hours. No results for input(s): PROBNP in the last 8760 hours. Coagulation Profile: No results for input(s): INR, PROTIME in the last 168 hours. Thyroid Function Tests: No results for input(s): TSH, T4TOTAL, FREET4, T3FREE, THYROIDAB in the last 72 hours. Lipid Profile: No results for input(s): CHOL, HDL, LDLCALC, TRIG, CHOLHDL, LDLDIRECT in the last 72 hours. Anemia Panel: No results for input(s): VITAMINB12, FOLATE, FERRITIN, TIBC, IRON, RETICCTPCT in the last 72 hours. Urine analysis:    Component Value Date/Time   COLORURINE YELLOW 07/06/2019 [place] 07/06/2019 0437   LABSPEC 1.024 07/06/2019 0437   PHURINE 5.0 07/06/2019 0437   GLUCOSEU >=500 (A) 07/06/2019 0437   HGBUR MODERATE (A) 07/06/2019 0437   BILIRUBINUR NEGATIVE 07/06/2019 0437   KETONESUR 80 (A) 07/06/2019 0437   PROTEINUR NEGATIVE 07/06/2019 0437   NITRITE NEGATIVE 07/06/2019 0437   LEUKOCYTESUR NEGATIVE 07/06/2019 0437   Sepsis Labs: Invalid input(s): PROCALCITONIN, [place]  Microbiology: Recent Results (from the past 240 hour(s))  Aerobic/Anaerobic Culture (surgical/deep wound)     Status: None   Collection Time: 07/09/19  8:32 AM   Specimen: Abscess  Result Value Ref Range Status   Specimen Description ABSCESS HIP RIGHT  Final   Special Requests NONE  Final   Gram Stain   Final    MODERATE WBC PRESENT,BOTH PMN AND MONONUCLEAR ABUNDANT GRAM POSITIVE COCCI IN PAIRS IN CLUSTERS    Culture    Final    FEW STAPHYLOCOCCUS AUREUS NO ANAEROBES ISOLATED Performed at [facility], [address], [place], [place] [address]    Report Status 07/14/2019 FINAL  Final   Organism ID, Bacteria STAPHYLOCOCCUS AUREUS  Final      Susceptibility   Staphylococcus aureus - MIC*    CIPROFLOXACIN <=0.5 SENSITIVE Sensitive     ERYTHROMYCIN >=8 RESISTANT Resistant     GENTAMICIN <=0.5 SENSITIVE Sensitive     OXACILLIN <=0.25 SENSITIVE Sensitive     TETRACYCLINE >=16 RESISTANT Resistant     VANCOMYCIN 1 SENSITIVE Sensitive     TRIMETH/SULFA <=10 SENSITIVE Sensitive     CLINDAMYCIN RESISTANT Resistant     RIFAMPIN <=0.5 SENSITIVE Sensitive     Inducible Clindamycin POSITIVE Resistant     * FEW STAPHYLOCOCCUS AUREUS  Culture, blood (routine x 2)     Status: None   Collection Time: 07/09/19 12:57 PM   Specimen: BLOOD RIGHT HAND  Result Value Ref Range Status   Specimen Description BLOOD RIGHT HAND  Final   Special Requests   Final    AEROBIC BOTTLE ONLY Blood Culture results may not be optimal due to an inadequate volume of blood received in culture bottles   Culture   Final    NO GROWTH 5 DAYS Performed at [facility], [place] [address] [place], [place] [address]    Report Status 07/14/2019 FINAL  Final  Culture, blood (routine x 2)     Status: None   Collection Time: 07/09/19  1:06 PM   Specimen: BLOOD RIGHT HAND  Result Value Ref Range Status   Specimen Description BLOOD RIGHT HAND  Final   Special Requests AEROBIC BOTTLE ONLY Blood Culture adequate volume  Final   Culture   Final    NO GROWTH 5 DAYS Performed at [facility], [address] [place], [place] [address]    Report Status 07/14/2019 FINAL  Final    Radiology Studies: No results found.  Total time spent 28 minutes Ishmael Holter, M.D. [facility]  If 7PM-7AM, please contact night-coverage [email or web address] Password [facility] 07/18/2019, 12:09 PM

## 2019-07-19 LAB — CBC WITH DIFFERENTIAL/PLATELET
Abs Immature Granulocytes: 0.08 10*3/uL — ABNORMAL HIGH (ref 0.00–0.07)
Basophils Absolute: 0 10*3/uL (ref 0.0–0.1)
Basophils Relative: 0 %
Eosinophils Absolute: 0.1 10*3/uL (ref 0.0–0.5)
Eosinophils Relative: 1 %
HCT: 27.6 % — ABNORMAL LOW (ref 39.0–52.0)
Hemoglobin: 9.2 g/dL — ABNORMAL LOW (ref 13.0–17.0)
Immature Granulocytes: 1 %
Lymphocytes Relative: 13 %
Lymphs Abs: 1.2 10*3/uL (ref 0.7–4.0)
MCH: 31.2 pg (ref 26.0–34.0)
MCHC: 33.3 g/dL (ref 30.0–36.0)
MCV: 93.6 fL (ref 80.0–100.0)
Monocytes Absolute: 0.7 10*3/uL (ref 0.1–1.0)
Monocytes Relative: 7 %
Neutro Abs: 7.3 10*3/uL (ref 1.7–7.7)
Neutrophils Relative %: 78 %
Platelets: 278 10*3/uL (ref 150–400)
RBC: 2.95 MIL/uL — ABNORMAL LOW (ref 4.22–5.81)
RDW: 13.2 % (ref 11.5–15.5)
WBC: 9.3 10*3/uL (ref 4.0–10.5)
nRBC: 0 % (ref 0.0–0.2)

## 2019-07-19 LAB — GLUCOSE, CAPILLARY
Glucose-Capillary: 166 mg/dL — ABNORMAL HIGH (ref 70–99)
Glucose-Capillary: 159 mg/dL — ABNORMAL HIGH (ref 70–99)
Glucose-Capillary: 128 mg/dL — ABNORMAL HIGH (ref 70–99)
Glucose-Capillary: 132 mg/dL — ABNORMAL HIGH (ref 70–99)

## 2019-07-19 LAB — COMPREHENSIVE METABOLIC PANEL
ALT: 14 U/L (ref 0–44)
AST: 17 U/L (ref 15–41)
Albumin: 1.4 g/dL — ABNORMAL LOW (ref 3.5–5.0)
Alkaline Phosphatase: 60 U/L (ref 38–126)
Anion gap: 11 (ref 5–15)
BUN: <5 mg/dL — ABNORMAL LOW (ref 6–20)
CO2: 29 mmol/L (ref 22–32)
Calcium: 8.2 mg/dL — ABNORMAL LOW (ref 8.9–10.3)
Chloride: 91 mmol/L — ABNORMAL LOW (ref 98–111)
Creatinine, Ser: 0.55 mg/dL — ABNORMAL LOW (ref 0.61–1.24)
GFR calc Af Amer: >60 mL/min (ref >60)
GFR calc non Af Amer: >60 mL/min (ref >60)
Glucose, Bld: 160 mg/dL — ABNORMAL HIGH (ref 70–99)
Potassium: 3.8 mmol/L (ref 3.5–5.1)
Sodium: 131 mmol/L — ABNORMAL LOW (ref 135–145)
Total Bilirubin: 0.2 mg/dL — ABNORMAL LOW (ref 0.3–1.2)
Total Protein: 6.1 g/dL — ABNORMAL LOW (ref 6.5–8.1)

## 2019-07-19 MED ORDER — INFLUENZA VAC SPLIT QUAD 0.5 ML IM SUSY
0.5000 mL | PREFILLED_SYRINGE | INTRAMUSCULAR | Status: DC
  Filled 2019-07-19: qty 0.5

## 2019-07-19 NOTE — TOC Progression Note (Signed)
Transition of Care [facility]) - Progression Note    Patient Details  Name: MACGREGOR LANCE MRN: [id number] Date of Birth: 06/14/1971  Transition of [facility]) CM/SW [place], [place] Phone Number: 07/19/2019, 3:43 PM  Clinical Narrative:     Patient's father returned CSW call. CSW explained PT recommendation of SNF. CSW discussed the SNF process and explained possible placement barrier, no insurance, however, CSW will continue to seek placement. Patient's father states he understands and confirmed the patient will discharge to his home [facility] in [place] once he has completed rehab. CSW will keep patient and his father updated on placement options.   Thurmond Butts, MSW, LCSWA Clinical Social Worker [phone]     Barriers to Discharge: Continued Medical Work up, Inadequate or no insurance, No SNF bed, SNF Pending bed offer  Expected Discharge Plan and Services   In-house Referral: Clinical Social Work     Living arrangements for the past 2 months: Single Family Home                                       Social Determinants of Health (SDOH) Interventions    Readmission Risk Interventions No flowsheet data found.

## 2019-07-19 NOTE — Progress Notes (Signed)
Orthopedic Trauma Service Progress Note  Patient ID: Bradley Cox MRN: [id number] DOB/AGE: Jul 27, 1971 48 y.o.  Subjective:  Doing better Pain marginally better Sharp pain at times that seems to travel along the path of the piriformis and short rotators and then down his leg No other complaints     ROS As above  Objective:   VITALS:   Vitals:   07/19/19 0013 07/19/19 0415 07/19/19 0735 07/19/19 1110  BP: 108/66 114/67 111/72 125/78  Pulse: 97 98 96 98  Resp:   (!) 24 17  Temp: 97.6 F (36.4 C) 99.1 F (37.3 C) 98.1 F (36.7 C) 99.1 F (37.3 C)  TempSrc: Oral Oral Oral Oral  SpO2: 96% 93% 93% 97%  Weight:      Height:        Estimated body mass index is 41.78 kg/m as calculated from the following:   Height as of this encounter: 6\' 6"  (1.981 m).   Weight as of this encounter: 164 kg.   Intake/Output      09/16 0701 - 09/17 0700 09/17 0701 - 09/18 0700   I.V. (mL/kg)     IV Piggyback  100   Total Intake(mL/kg)  100 (0.6)   Urine (mL/kg/hr) 3650 (0.9) 1700 (1.4)   Drains     Blood     Total Output 3650 1700   Net -3650 -1600          LABS  Results for orders placed or performed during the hospital encounter of 07/05/19 (from the past 24 hour(s))  Glucose, capillary     Status: Abnormal   Collection Time: 07/18/19  4:06 PM  Result Value Ref Range   Glucose-Capillary 135 (H) 70 - 99 mg/dL  Glucose, capillary     Status: Abnormal   Collection Time: 07/18/19  9:57 PM  Result Value Ref Range   Glucose-Capillary 168 (H) 70 - 99 mg/dL  CBC with Differential/Platelet     Status: Abnormal   Collection Time: 07/19/19  5:35 AM  Result Value Ref Range   WBC 9.3 4.0 - 10.5 K/uL   RBC 2.95 (L) 4.22 - 5.81 MIL/uL   Hemoglobin 9.2 (L) 13.0 - 17.0 g/dL   HCT 27.6 (L) 39.0 - 52.0 %   MCV 93.6 80.0 - 100.0 fL   MCH 31.2 26.0 - 34.0 pg   MCHC 33.3 30.0 - 36.0 g/dL   RDW 13.2 11.5 - 15.5 %    Platelets 278 150 - 400 K/uL   nRBC 0.0 0.0 - 0.2 %   Neutrophils Relative % 78 %   Neutro Abs 7.3 1.7 - 7.7 K/uL   Lymphocytes Relative 13 %   Lymphs Abs 1.2 0.7 - 4.0 K/uL   Monocytes Relative 7 %   Monocytes Absolute 0.7 0.1 - 1.0 K/uL   Eosinophils Relative 1 %   Eosinophils Absolute 0.1 0.0 - 0.5 K/uL   Basophils Relative 0 %   Basophils Absolute 0.0 0.0 - 0.1 K/uL   Immature Granulocytes 1 %   Abs Immature Granulocytes 0.08 (H) 0.00 - 0.07 K/uL  Comprehensive metabolic panel     Status: Abnormal   Collection Time: 07/19/19  5:35 AM  Result Value Ref Range   Sodium 131 (L) 135 - 145 mmol/L   Potassium 3.8 3.5 - 5.1 mmol/L  Chloride 91 (L) 98 - 111 mmol/L   CO2 29 22 - 32 mmol/L   Glucose, Bld 160 (H) 70 - 99 mg/dL   BUN <5 (L) 6 - 20 mg/dL   Creatinine, Ser 0.55 (L) 0.61 - 1.24 mg/dL   Calcium 8.2 (L) 8.9 - 10.3 mg/dL   Total Protein 6.1 (L) 6.5 - 8.1 g/dL   Albumin 1.4 (L) 3.5 - 5.0 g/dL   AST 17 15 - 41 U/L   ALT 14 0 - 44 U/L   Alkaline Phosphatase 60 38 - 126 U/L   Total Bilirubin 0.2 (L) 0.3 - 1.2 mg/dL   GFR calc non Af Amer >60 >60 mL/min   GFR calc Af Amer >60 >60 mL/min   Anion gap 11 5 - 15  Glucose, capillary     Status: Abnormal   Collection Time: 07/19/19  7:37 AM  Result Value Ref Range   Glucose-Capillary 166 (H) 70 - 99 mg/dL  Glucose, capillary     Status: Abnormal   Collection Time: 07/19/19 11:11 AM  Result Value Ref Range   Glucose-Capillary 159 (H) 70 - 99 mg/dL     PHYSICAL EXAM:   Gen: awake and alert, NAD, very pleasant  Lungs: unlabored Cardiac: regular  Ext:        Right Lower Extremity              R hip wound vac functioning                         Good seal                         Sanguinous drainage in canister, no purulence noted             Swelling stable R leg             DPN, SPN, TN sensation diminished             Weak EHL and ankle extension but ankle and toe ROM grossly intact                         Pt does have  active control   Good SLR, knee extension and flexion              No DCT              Compartments are soft   Assessment/Plan: 2 Days Post-Op   Principal Problem:   MSSA bacteremia Active Problems:   Obesity, Class III, BMI 40-49.9 (morbid obesity) (HCC)   Essential hypertension   Sepsis (HCC)   AKI (acute kidney injury) (HCC)   Hyperkalemia   Abscess of right hip   Paraspinal abscess (HCC)   Compression of right sciatic nerve   Anti-infectives (From admission, onward)   Start     Dose/Rate Route Frequency Ordered Stop   07/17/19 1857  gentamicin (GARAMYCIN) injection  Status:  Discontinued       As needed 07/17/19 1857 07/17/19 2009   07/17/19 1857  vancomycin (VANCOCIN) powder  Status:  Discontinued       As needed 07/17/19 1858 07/17/19 2009   07/10/19 1600  metroNIDAZOLE (FLAGYL) IVPB 500 mg  Status:  Discontinued     500 mg 100 mL/hr over 60 Minutes Intravenous Every 8 hours 07/10/19 1549 07/13/19 0939   07/09/19 0839  vancomycin (VANCOCIN) powder  Status:  Discontinued       As needed 07/09/19 0839 07/09/19 0935   07/09/19 0839  tobramycin (NEBCIN) powder  Status:  Discontinued       As needed 07/09/19 0840 07/09/19 0935   07/07/19 1830  ceFAZolin (ANCEF) IVPB 2g/100 mL premix     2 g 200 mL/hr over 30 Minutes Intravenous Every 8 hours 07/07/19 1333     07/07/19 0900  ceFAZolin (ANCEF) IVPB 2g/100 mL premix  Status:  Discontinued     2 g 200 mL/hr over 30 Minutes Intravenous Every 8 hours 07/07/19 0847 07/07/19 1333   07/06/19 2030  clindamycin (CLEOCIN) IVPB 600 mg  Status:  Discontinued     600 mg 100 mL/hr over 30 Minutes Intravenous Every 8 hours 07/06/19 2010 07/07/19 0847   07/06/19 1800  vancomycin (VANCOCIN) 1,500 mg in sodium chloride 0.9 % 500 mL IVPB  Status:  Discontinued     1,500 mg 250 mL/hr over 120 Minutes Intravenous Every 12 hours 07/06/19 0820 07/07/19 0847   07/06/19 1400  ceFEPIme (MAXIPIME) 2 g in sodium chloride 0.9 % 100 mL IVPB  Status:   Discontinued     2 g 200 mL/hr over 30 Minutes Intravenous Every 8 hours 07/06/19 0504 07/07/19 0847   07/06/19 1400  vancomycin (VANCOCIN) 2,000 mg in sodium chloride 0.9 % 500 mL IVPB  Status:  Discontinued     2,000 mg 250 mL/hr over 120 Minutes Intravenous Every 8 hours 07/06/19 0504 07/06/19 0818   07/06/19 0745  clindamycin (CLEOCIN) IVPB 600 mg  Status:  Discontinued     600 mg 100 mL/hr over 30 Minutes Intravenous  Once 07/06/19 0737 07/06/19 2029   07/06/19 0500  ceFEPIme (MAXIPIME) 2 g in sodium chloride 0.9 % 100 mL IVPB     2 g 200 mL/hr over 30 Minutes Intravenous  Once 07/06/19 0451 07/06/19 1335   07/06/19 0500  vancomycin (VANCOCIN) 2,500 mg in sodium chloride 0.9 % 500 mL IVPB     2,500 mg 250 mL/hr over 120 Minutes Intravenous  Once 07/06/19 0451 07/06/19 1335    .  POD/HD#: 2  48 y/o male with multifocal abscesses s/p I&D right gluteal abscess   - R gluteal abscess s/p serial I&D (MSSA)             Continue with VAC             WBAT R leg             Return to OR tomorrow for repeat I&D, VAC change vs closure depending on what soft tissue looks like             Ice prn                            - Pain management:             norco  Gabapentin    - ID:              Per infectious disease   - Dispo:             return to OR tomorrow am   Jari Pigg, PA-C [phone] (C) 07/19/2019, 2:32 PM  [facility] [place] [address] [phone] Domingo Sep (F)

## 2019-07-19 NOTE — Progress Notes (Signed)
Physical Therapy Treatment Patient Details Name: Bradley Cox MRN: [id number] DOB: Jul 06, 1971 Today's Date: 07/19/2019    History of Present Illness Patient is a 48 y/o male who presents with right flank pain. Admitted with disseminated MSSA infection including bacteremia, Lumbosacral spinal epidural abscess, paraspinal/large gluteal/pelvic abscess with sciatica impingement. s/p I&D large gluteal abscess and neuroplasty of sciatica nerve 9/7. s/p CT aspiration right paraspinal abscess. PMH includes DM, HTN, morbid obesity, OSA.    PT Comments    Patient needing encouragement to get up, but was able to walk to bathroom with +2 A for safety and lines with bariatric RW and painful/antalgic gait due to R LE pain.  Very fatigued and almost pre-syncopal at times sitting on toilet in bathroom for BM.  Still able to walk back to bed, but generally fatigued and painful.  RN made aware.  Will need follow up inpatient post acute rehab prior to d/c home.    Follow Up Recommendations  CIR;Supervision for mobility/OOB;Supervision/Assistance - 24 hour     Equipment Recommendations  Rolling walker with 5" wheels;3in1 (PT)    Recommendations for Other Services       Precautions / Restrictions Precautions Precautions: Fall Precaution Comments: wound vac Restrictions RLE Weight Bearing: Weight bearing as tolerated    Mobility  Bed Mobility Overal bed mobility: Needs Assistance       Supine to sit: Min assist Sit to supine: Mod assist   General bed mobility comments: heavy UE support to pull up on rail to sit with assist for L LE, to supine assist for L LE and positioning in supine  Transfers Overall transfer level: Needs assistance Equipment used: Rolling walker (2 wheeled)   Sit to Stand: From elevated surface;Mod assist;+2 physical assistance;Max assist         General transfer comment: lifting assist from elevated bed to stand with cues for hand placement and momentum (initially  unable to stand till bed higher); from toilet in bathroom max A to stand with grabbar and walker increased time  Ambulation/Gait Ambulation/Gait assistance: +2 safety/equipment;Mod assist Gait Distance (Feet): 10 Feet Assistive device: Rolling walker (2 wheeled) Gait Pattern/deviations: Step-to pattern;Trunk flexed;Shuffle     General Gait Details: limited tolerance with pain in leg, but using walker appropriately, fatigued and slightly pre-syncopal at times   Stairs             Wheelchair Mobility    Modified Rankin (Stroke Patients Only)       Balance   Sitting-balance support: Feet supported;No upper extremity supported Sitting balance-Leahy Scale: Fair Sitting balance - Comments: needed L UE to lean on due to pain R hip   Standing balance support: Bilateral upper extremity supported Standing balance-Leahy Scale: Poor Standing balance comment: reliant on BUE during funcitonal mobility                            Cognition Arousal/Alertness: Awake/alert Behavior During Therapy: WFL for tasks assessed/performed Overall Cognitive Status: Within Functional Limits for tasks assessed                                        Exercises      General Comments General comments (skin integrity, edema, etc.): toileted in bathroom on commode, assist for hygiene in standing      Pertinent Vitals/Pain Pain Assessment: Faces Faces Pain Scale: Hurts whole  lot Pain Location: RLE Pain Descriptors / Indicators: Constant;Discomfort;Grimacing Pain Intervention(s): Repositioned;Monitored during session;Patient requesting pain meds-RN notified    Home Living                      Prior Function            PT Goals (current goals can now be found in the care plan section) Progress towards PT goals: Progressing toward goals    Frequency    Min 3X/week      PT Plan Current plan remains appropriate    Co-evaluation               AM-PAC PT "6 Clicks" Mobility   Outcome Measure  Help needed turning from your back to your side while in a flat bed without using bedrails?: A Little Help needed moving from lying on your back to sitting on the side of a flat bed without using bedrails?: A Little Help needed moving to and from a bed to a chair (including a wheelchair)?: A Lot Help needed standing up from a chair using your arms (e.g., wheelchair or bedside chair)?: A Lot Help needed to walk in hospital room?: A Lot Help needed climbing 3-5 steps with a railing? : Total 6 Click Score: 13    End of Session Equipment Utilized During Treatment: Gait belt Activity Tolerance: Patient limited by pain;Patient limited by fatigue Patient left: in bed;with call bell/phone within reach   PT Visit Diagnosis: Muscle weakness (generalized) (M62.81);Pain;Unsteadiness on feet (R26.81);Difficulty in walking, not elsewhere classified (R26.2) Pain - Right/Left: Right Pain - part of body: Hip     Time: 1452-1536 PT Time Calculation (min) (ACUTE ONLY): 44 min  Charges:  $Gait Training: 8-22 mins $Therapeutic Activity: 23-37 mins                     Magda Kiel, [place] [facility] [phone] 07/19/2019    Reginia Naas 07/19/2019, 5:43 PM

## 2019-07-19 NOTE — Progress Notes (Signed)
PROGRESS NOTE  Bradley Cox [id number] DOB: 03-13-1971   PCP: Simona Huh, NP  Patient is from: Home  DOA: 07/05/2019 LOS: 66  Brief Narrative / Interim history: 48 year old male with history of obesity, asthma, hypertension, GERD, nephrolithiasis, diabetes mellitus who presented to ED on 07/06/2019 with low back pain radiating to the right gluteus/thigh with progressive decline in the right lower extremity strength, difficulty ambulation over 2 to 3 weeks.  On presentation he was found to be febrile, tachycardic, tachypneic.  Blood glucose was elevated with increased anion gap.  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  Patient was started on broad-spectrum antibiotics, insulin for DKA.  Neurosurgery, ID consulted.  Patient underwent IR guided his paraspinal abscess drainage.  Blood cultures now growing MSSA. Dr. Doreatha Martin removed 500 mL from a large right-sided gluteal abscess, then performed a neuroplasty of the right sciatic nerve on 07/09/2019. Currently on cefazolin.  Patient underwent TTE on 07/07/2019 followed by TEE on 07/11/2019 which showed no vegetations and normal ejection fraction and no thrombus.  Patient had episode of nausea and vomiting the night of 9/9.  Vomiting was nonbilious.  KUB obtained and revealed marked gaseous dilation of stomach and nonobstructive gas pattern and small and large bowel concerning for ileus. NG tube was placed for decompression.  However, NG tube came out the next morning.  Patient has not had further emesis.  Passing gas but has not has bowel movement.  Repeat abdominal x-ray on 07/15/2019 shows resolved ileus.  On 07/16/2019 is right hip/buttock wound started oozing significantly. Dr. Doreatha Martin and orthopedic team was contacted.  Initially his wound was thought to be nonpurulent.  He was evaluated again on T/15/2020 and it became purulent and Ortho took him to  the OR and did another I&D of right gluteal abscess with drainage of 300 cc purulent fluid.  Assessment & Plan: Sepsis/MSSA bacteremia/L3-S1 epidural abscess/right iliopsoas and gluteal abscess/right lower extremity cellulitis -Lower extremity Doppler negative for DVT. -Currently on cefazolin per ID.  Plan is 6 to 8 weeks of IV Ancef.  -Blood and tissue cultures on 9/4-growing MSSA. Repeat blood culture on 9/7 negative so far. -TTE and TEE did not show any vegetation or any thrombus.  . -PICC line 9/12.-PT/OT evaluated and they recommended CIR Status post another I&D of the right gluteal abscess with drainage of 300 cc purulent fluid on 07/17/2019.  Leukocytosis improved.  Remains afebrile.  Doing better.  Pain management per orthopedics.  Ileus/constipation/possible gastric outlet obstruction versus possible gastroparesis -Status post NG tube decompression of gastric gas.  -No further emesis.  Passing gas.  -Mobilize as able which has been difficult.  PRN medications for constipation.  Repeat abdominal KUB on 07/15/2019 does not show any ileus or SBO.  Uncontrolled NIDDM-2/DKA: DKA resolved. Hemoglobin A1c of 12.6.  Blood sugar controlled.  Continue current NPH 23 units twice daily along with 8 units NovoLog pre-meal and SSI.  Hyponatremia: Mild and stable.  Check daily labs.  AKI:  Resolved   Hypokalemia:  Resolved.  Hypertension:   Normotensive. -Continue hydralazine scheduled and as needed.  GERD: Continue PPI  Fatty liver:  Check lipid profile.   May benefit from statins.    Morbid obesity:  BMI 42.6 Diet and exercise Further management on outpatient basis  DVT prophylaxis: Subcu Lovenox Code Status: Full code Family Communication: None present at bedside.  Plan of care discussed with patient. Disposition Plan:  Remains inpatient pending clinical improvement and clearance by consultants.  Final disposition CIR early next week pending improvement Consultants:  Ortho trauma, infectious disease   Subjective: Seen and examined.  Pain improved.  No new complaint.  Moving his right lower extremity slightly more today.  Objective: Vitals:   07/18/19 2037 07/19/19 0013 07/19/19 0415 07/19/19 0735  BP: 109/64 108/66 114/67 111/72  Pulse: (!) 107 97 98 96  Resp:    (!) 24  Temp: 98.2 F (36.8 C) 97.6 F (36.4 C) 99.1 F (37.3 C) 98.1 F (36.7 C)  TempSrc: Oral Oral Oral Oral  SpO2: 94% 96% 93% 93%  Weight:      Height:        Intake/Output Summary (Last 24 hours) at 07/19/2019 1021 Last data filed at 07/19/2019 0400 Gross per 24 hour  Intake -  Output 3650 ml  Net -3650 ml   Filed Weights   07/05/19 1807 07/06/19 2116  Weight: (!) 167.4 kg (!) 164 kg    Examination:  General exam: Appears calm and comfortable, morbidly obese Respiratory system: Clear to auscultation. Respiratory effort normal. Cardiovascular system: S1 & S2 heard, RRR. No JVD, murmurs, rubs, gallops or clicks. No pedal edema. Gastrointestinal system: Abdomen is nondistended, soft and nontender. No organomegaly or masses felt. Normal bowel sounds heard. Central nervous system: Alert and oriented.  4/5 power in right lower extremity Extremities: Symmetric 5 x 5 power except right lower extremity where he has 4/5 power. Skin: No rashes, lesions or ulcers.  Psychiatry: Judgement and insight appear normal. Mood & affect appropriate.   Procedures:  9/6-incision and drainage of paraspinal abscess 07/09/2019 neuroplasty of the right sciatic nerve by neurosurgery 9/9-TEE  Microbiology summarized: 9/4-SARS-CoV-2 screen negative 9/4-blood and tissue cultures-MSSA 9/7-blood cultures negative.  Antimicrobials: Anti-infectives (From admission, onward)   Start     Dose/Rate Route Frequency Ordered Stop   07/17/19 1857  gentamicin (GARAMYCIN) injection  Status:  Discontinued       As needed 07/17/19 1857 07/17/19 2009   07/17/19 1857  vancomycin (VANCOCIN) powder  Status:   Discontinued       As needed 07/17/19 1858 07/17/19 2009   07/10/19 1600  metroNIDAZOLE (FLAGYL) IVPB 500 mg  Status:  Discontinued     500 mg 100 mL/hr over 60 Minutes Intravenous Every 8 hours 07/10/19 1549 07/13/19 0939   07/09/19 0839  vancomycin (VANCOCIN) powder  Status:  Discontinued       As needed 07/09/19 0839 07/09/19 0935   07/09/19 0839  tobramycin (NEBCIN) powder  Status:  Discontinued       As needed 07/09/19 0840 07/09/19 0935   07/07/19 1830  ceFAZolin (ANCEF) IVPB 2g/100 mL premix     2 g 200 mL/hr over 30 Minutes Intravenous Every 8 hours 07/07/19 1333     07/07/19 0900  ceFAZolin (ANCEF) IVPB 2g/100 mL premix  Status:  Discontinued     2 g 200 mL/hr over 30 Minutes Intravenous Every 8 hours 07/07/19 0847 07/07/19 1333   07/06/19 2030  clindamycin (CLEOCIN) IVPB 600 mg  Status:  Discontinued     600 mg 100 mL/hr over 30 Minutes Intravenous Every 8 hours 07/06/19 2010 07/07/19 0847   07/06/19 1800  vancomycin (VANCOCIN) 1,500 mg in sodium chloride 0.9 % 500 mL IVPB  Status:  Discontinued     1,500 mg 250 mL/hr over 120 Minutes Intravenous Every 12 hours 07/06/19 0820 07/07/19 0847   07/06/19 1400  ceFEPIme (MAXIPIME) 2 g in sodium  chloride 0.9 % 100 mL IVPB  Status:  Discontinued     2 g 200 mL/hr over 30 Minutes Intravenous Every 8 hours 07/06/19 0504 07/07/19 0847   07/06/19 1400  vancomycin (VANCOCIN) 2,000 mg in sodium chloride 0.9 % 500 mL IVPB  Status:  Discontinued     2,000 mg 250 mL/hr over 120 Minutes Intravenous Every 8 hours 07/06/19 0504 07/06/19 0818   07/06/19 0745  clindamycin (CLEOCIN) IVPB 600 mg  Status:  Discontinued     600 mg 100 mL/hr over 30 Minutes Intravenous  Once 07/06/19 0737 07/06/19 2029   07/06/19 0500  ceFEPIme (MAXIPIME) 2 g in sodium chloride 0.9 % 100 mL IVPB     2 g 200 mL/hr over 30 Minutes Intravenous  Once 07/06/19 0451 07/06/19 1335   07/06/19 0500  vancomycin (VANCOCIN) 2,500 mg in sodium chloride 0.9 % 500 mL IVPB      2,500 mg 250 mL/hr over 120 Minutes Intravenous  Once 07/06/19 0451 07/06/19 1335      Sch Meds:  Scheduled Meds: . acetaminophen  500 mg Oral Q12H   Or  . acetaminophen  650 mg Rectal Q12H  . Chlorhexidine Gluconate Cloth  6 each Topical Daily  . enoxaparin (LOVENOX) injection  0.5 mg/kg Subcutaneous Q24H  . gabapentin  300 mg Oral TID  . hydrALAZINE  25 mg Oral BID  . [START ON 07/20/2019] influenza vac split quadrivalent PF  0.5 mL Intramuscular Tomorrow-1000  . insulin aspart  0-15 Units Subcutaneous TID WC  . insulin aspart  0-5 Units Subcutaneous QHS  . insulin aspart  8 Units Subcutaneous TID WC  . insulin NPH Human  23 Units Subcutaneous BID AC & HS  . methocarbamol  750 mg Oral TID  . metoCLOPramide  10 mg Oral TID AC & HS  . polyethylene glycol  17 g Oral BID  . senna-docusate  1 tablet Oral BID  . sodium chloride flush  10-40 mL Intracatheter Q12H  . sodium chloride flush  3 mL Intravenous Q12H   Continuous Infusions: .  ceFAZolin (ANCEF) IV 2 g (07/19/19 0142)  . lactated ringers     PRN Meds:.albuterol, fentaNYL (SUBLIMAZE) injection, hydrALAZINE, HYDROcodone-acetaminophen, ondansetron **OR** ondansetron (ZOFRAN) IV, sodium chloride flush, zolpidem   I have personally reviewed the following labs and images: CBC: Recent Labs  Lab 07/13/19 0830 07/15/19 0504 07/17/19 0500 07/18/19 0422 07/19/19 0535  WBC 11.5* 12.7* 12.9* 11.5* 9.3  NEUTROABS  --   --  10.1* 8.9* 7.3  HGB 10.4* 9.7* 9.7* 9.8* 9.2*  HCT 33.4* 30.5* 29.8* 31.8* 27.6*  MCV 97.4 94.7 94.3 95.8 93.6  PLT 372 338 310 301 278   BMP &GFR Recent Labs  Lab 07/13/19 0830 07/15/19 0504 07/18/19 0422 07/19/19 0535  NA 131* 133* 133* 131*  K 4.1 4.1 4.1 3.8  CL 92* 93* 93* 91*  CO2 29 32 30 29  GLUCOSE 141* 191* 133* 160*  BUN <5* <5* <5* <5*  CREATININE 0.53* 0.46* 0.56* 0.55*  CALCIUM 8.1* 8.1* 8.4* 8.2*  MG 1.9 1.8 1.8  --    Estimated Creatinine Clearance: 188.1 mL/min (A) (by C-G  formula based on SCr of 0.55 mg/dL (L)). Liver & Pancreas: Recent Labs  Lab 07/18/19 0422 07/19/19 0535  AST 19 17  ALT 15 14  ALKPHOS 64 60  BILITOT 0.4 0.2*  PROT 6.0* 6.1*  ALBUMIN 1.5* 1.4*   No results for input(s): LIPASE, AMYLASE in the last 168 hours. No results for  input(s): AMMONIA in the last 168 hours. Diabetic: No results for input(s): HGBA1C in the last 72 hours. Recent Labs  Lab 07/18/19 0803 07/18/19 1157 07/18/19 1606 07/18/19 2157 07/19/19 0737  GLUCAP 110* 195* 135* 168* 166*   Cardiac Enzymes: No results for input(s): CKTOTAL, CKMB, CKMBINDEX, TROPONINI in the last 168 hours. No results for input(s): PROBNP in the last 8760 hours. Coagulation Profile: No results for input(s): INR, PROTIME in the last 168 hours. Thyroid Function Tests: No results for input(s): TSH, T4TOTAL, FREET4, T3FREE, THYROIDAB in the last 72 hours. Lipid Profile: No results for input(s): CHOL, HDL, LDLCALC, TRIG, CHOLHDL, LDLDIRECT in the last 72 hours. Anemia Panel: No results for input(s): VITAMINB12, FOLATE, FERRITIN, TIBC, IRON, RETICCTPCT in the last 72 hours. Urine analysis:    Component Value Date/Time   COLORURINE YELLOW 07/06/2019 [place] 07/06/2019 0437   LABSPEC 1.024 07/06/2019 0437   PHURINE 5.0 07/06/2019 0437   GLUCOSEU >=500 (A) 07/06/2019 0437   HGBUR MODERATE (A) 07/06/2019 0437   BILIRUBINUR NEGATIVE 07/06/2019 0437   KETONESUR 80 (A) 07/06/2019 0437   PROTEINUR NEGATIVE 07/06/2019 0437   NITRITE NEGATIVE 07/06/2019 0437   LEUKOCYTESUR NEGATIVE 07/06/2019 0437   Sepsis Labs: Invalid input(s): PROCALCITONIN, [place]  Microbiology: Recent Results (from the past 240 hour(s))  Culture, blood (routine x 2)     Status: None   Collection Time: 07/09/19 12:57 PM   Specimen: BLOOD RIGHT HAND  Result Value Ref Range Status   Specimen Description BLOOD RIGHT HAND  Final   Special Requests   Final    AEROBIC BOTTLE ONLY Blood Culture  results may not be optimal due to an inadequate volume of blood received in culture bottles   Culture   Final    NO GROWTH 5 DAYS Performed at [facility], [place] [address] [place], [place] [address]    Report Status 07/14/2019 FINAL  Final  Culture, blood (routine x 2)     Status: None   Collection Time: 07/09/19  1:06 PM   Specimen: BLOOD RIGHT HAND  Result Value Ref Range Status   Specimen Description BLOOD RIGHT HAND  Final   Special Requests AEROBIC BOTTLE ONLY Blood Culture adequate volume  Final   Culture   Final    NO GROWTH 5 DAYS Performed at [facility], [place] [address] [place],  [address]    Report Status 07/14/2019 FINAL  Final    Radiology Studies: No results found.  Total time spent 28 minutes Ishmael Holter, M.D. [facility]  If 7PM-7AM, please contact night-coverage [email or web address] Password [facility] 07/19/2019, 10:21 AM

## 2019-07-20 ENCOUNTER — Inpatient Hospital Stay (HOSPITAL_COMMUNITY): Payer: Self-pay | Admitting: Anesthesiology

## 2019-07-20 ENCOUNTER — Encounter (HOSPITAL_COMMUNITY): Payer: Self-pay | Admitting: Anesthesiology

## 2019-07-20 ENCOUNTER — Encounter (HOSPITAL_COMMUNITY)
Admission: EM | Disposition: A | Discharge status: Rehabilitation Facility | Payer: Self-pay | Source: Home / Self Care | Attending: Family Medicine

## 2019-07-20 HISTORY — PX: APPLICATION OF WOUND VAC: SHX5189

## 2019-07-20 HISTORY — PX: INCISION AND DRAINAGE HIP: SHX1801

## 2019-07-20 LAB — GLUCOSE, CAPILLARY
Glucose-Capillary: 116 mg/dL — ABNORMAL HIGH (ref 70–99)
Glucose-Capillary: 112 mg/dL — ABNORMAL HIGH (ref 70–99)
Glucose-Capillary: 232 mg/dL — ABNORMAL HIGH (ref 70–99)
Glucose-Capillary: 326 mg/dL — ABNORMAL HIGH (ref 70–99)
Glucose-Capillary: 291 mg/dL — ABNORMAL HIGH (ref 70–99)

## 2019-07-20 LAB — CBC WITH DIFFERENTIAL/PLATELET
Abs Immature Granulocytes: 0.05 10*3/uL (ref 0.00–0.07)
Basophils Absolute: 0 10*3/uL (ref 0.0–0.1)
Basophils Relative: 0 %
Eosinophils Absolute: 0 10*3/uL (ref 0.0–0.5)
Eosinophils Relative: 0 %
HCT: 30.2 % — ABNORMAL LOW (ref 39.0–52.0)
Hemoglobin: 9.6 g/dL — ABNORMAL LOW (ref 13.0–17.0)
Immature Granulocytes: 0 %
Lymphocytes Relative: 5 %
Lymphs Abs: 0.6 10*3/uL — ABNORMAL LOW (ref 0.7–4.0)
MCH: 30.2 pg (ref 26.0–34.0)
MCHC: 31.8 g/dL (ref 30.0–36.0)
MCV: 95 fL (ref 80.0–100.0)
Monocytes Absolute: 0.2 10*3/uL (ref 0.1–1.0)
Monocytes Relative: 2 %
Neutro Abs: 10.4 10*3/uL — ABNORMAL HIGH (ref 1.7–7.7)
Neutrophils Relative %: 93 %
Platelets: 292 10*3/uL (ref 150–400)
RBC: 3.18 MIL/uL — ABNORMAL LOW (ref 4.22–5.81)
RDW: 13.2 % (ref 11.5–15.5)
WBC: 11.2 10*3/uL — ABNORMAL HIGH (ref 4.0–10.5)
nRBC: 0 % (ref 0.0–0.2)

## 2019-07-20 LAB — COMPREHENSIVE METABOLIC PANEL
ALT: 16 U/L (ref 0–44)
AST: 20 U/L (ref 15–41)
Albumin: 1.6 g/dL — ABNORMAL LOW (ref 3.5–5.0)
Alkaline Phosphatase: 70 U/L (ref 38–126)
Anion gap: 7 (ref 5–15)
BUN: <5 mg/dL — ABNORMAL LOW (ref 6–20)
CO2: 29 mmol/L (ref 22–32)
Calcium: 8.1 mg/dL — ABNORMAL LOW (ref 8.9–10.3)
Chloride: 94 mmol/L — ABNORMAL LOW (ref 98–111)
Creatinine, Ser: 0.51 mg/dL — ABNORMAL LOW (ref 0.61–1.24)
GFR calc Af Amer: >60 mL/min (ref >60)
GFR calc non Af Amer: >60 mL/min (ref >60)
Glucose, Bld: 357 mg/dL — ABNORMAL HIGH (ref 70–99)
Potassium: 4.3 mmol/L (ref 3.5–5.1)
Sodium: 130 mmol/L — ABNORMAL LOW (ref 135–145)
Total Bilirubin: 0.3 mg/dL (ref 0.3–1.2)
Total Protein: 6.5 g/dL (ref 6.5–8.1)

## 2019-07-20 LAB — VITAMIN D 25 HYDROXY (VIT D DEFICIENCY, FRACTURES): Vit D, 25-Hydroxy: 26.9 ng/mL — ABNORMAL LOW (ref 30.0–100.0)

## 2019-07-20 SURGERY — IRRIGATION AND DEBRIDEMENT HIP
Anesthesia: General | Site: Hip | Laterality: Right

## 2019-07-20 MED ORDER — MIDAZOLAM HCL 5 MG/5ML IJ SOLN
INTRAMUSCULAR | Status: DC | PRN
  Administered 2019-07-20: 2 mg via INTRAVENOUS

## 2019-07-20 MED ORDER — 0.9 % SODIUM CHLORIDE (POUR BTL) OPTIME
TOPICAL | Status: DC | PRN
  Administered 2019-07-20: 09:00:00 1000 mL

## 2019-07-20 MED ORDER — DEXAMETHASONE SODIUM PHOSPHATE 10 MG/ML IJ SOLN
INTRAMUSCULAR | Status: DC | PRN
  Administered 2019-07-20: 5 mg via INTRAVENOUS

## 2019-07-20 MED ORDER — ONDANSETRON HCL 4 MG/2ML IJ SOLN
INTRAMUSCULAR | Status: AC
  Filled 2019-07-20: qty 2

## 2019-07-20 MED ORDER — LIDOCAINE 2% (20 MG/ML) 5 ML SYRINGE
INTRAMUSCULAR | Status: DC | PRN
  Administered 2019-07-20: 100 mg via INTRAVENOUS

## 2019-07-20 MED ORDER — FENTANYL CITRATE (PF) 100 MCG/2ML IJ SOLN
INTRAMUSCULAR | Status: AC
  Filled 2019-07-20: qty 2

## 2019-07-20 MED ORDER — LACTATED RINGERS IV SOLN
INTRAVENOUS | Status: DC | PRN
  Administered 2019-07-20: 07:00:00 via INTRAVENOUS

## 2019-07-20 MED ORDER — LIDOCAINE 2% (20 MG/ML) 5 ML SYRINGE
INTRAMUSCULAR | Status: AC
  Filled 2019-07-20: qty 5

## 2019-07-20 MED ORDER — MIDAZOLAM HCL 2 MG/2ML IJ SOLN
INTRAMUSCULAR | Status: AC
  Filled 2019-07-20: qty 2

## 2019-07-20 MED ORDER — FENTANYL CITRATE (PF) 250 MCG/5ML IJ SOLN
INTRAMUSCULAR | Status: AC
  Filled 2019-07-20: qty 5

## 2019-07-20 MED ORDER — FENTANYL CITRATE (PF) 100 MCG/2ML IJ SOLN
INTRAMUSCULAR | Status: DC | PRN
  Administered 2019-07-20 (×5): 50 ug via INTRAVENOUS

## 2019-07-20 MED ORDER — PROPOFOL 10 MG/ML IV BOLUS
INTRAVENOUS | Status: AC
  Filled 2019-07-20: qty 40

## 2019-07-20 MED ORDER — SUGAMMADEX SODIUM 500 MG/5ML IV SOLN
INTRAVENOUS | Status: AC
  Filled 2019-07-20: qty 5

## 2019-07-20 MED ORDER — ACETAMINOPHEN 500 MG PO TABS
500.0000 mg | ORAL_TABLET | ORAL | Status: AC
  Administered 2019-07-20: 500 mg via ORAL
  Filled 2019-07-20: qty 1

## 2019-07-20 MED ORDER — ROCURONIUM BROMIDE 10 MG/ML (PF) SYRINGE
PREFILLED_SYRINGE | INTRAVENOUS | Status: AC
  Filled 2019-07-20: qty 10

## 2019-07-20 MED ORDER — FENTANYL CITRATE (PF) 100 MCG/2ML IJ SOLN
25.0000 ug | INTRAMUSCULAR | Status: DC | PRN
  Administered 2019-07-20: 50 ug via INTRAVENOUS

## 2019-07-20 MED ORDER — ACETAMINOPHEN 500 MG PO TABS
1000.0000 mg | ORAL_TABLET | Freq: Once | ORAL | Status: DC
  Filled 2019-07-20: qty 2

## 2019-07-20 MED ORDER — ONDANSETRON HCL 4 MG/2ML IJ SOLN
INTRAMUSCULAR | Status: DC | PRN
  Administered 2019-07-20: 4 mg via INTRAVENOUS

## 2019-07-20 MED ORDER — PROPOFOL 10 MG/ML IV BOLUS
INTRAVENOUS | Status: DC | PRN
  Administered 2019-07-20: 200 mg via INTRAVENOUS

## 2019-07-20 MED ORDER — DEXAMETHASONE SODIUM PHOSPHATE 10 MG/ML IJ SOLN
INTRAMUSCULAR | Status: AC
  Filled 2019-07-20: qty 1

## 2019-07-20 MED ORDER — ROCURONIUM BROMIDE 50 MG/5ML IV SOSY
PREFILLED_SYRINGE | INTRAVENOUS | Status: DC | PRN
  Administered 2019-07-20: 180 mg via INTRAVENOUS

## 2019-07-20 MED ORDER — SUGAMMADEX SODIUM 200 MG/2ML IV SOLN
INTRAVENOUS | Status: DC | PRN
  Administered 2019-07-20: 400 mg via INTRAVENOUS

## 2019-07-20 SURGICAL SUPPLY — 43 items
BNDG COHESIVE 4X5 TAN STRL (GAUZE/BANDAGES/DRESSINGS) ×3 IMPLANT
BRUSH SCRUB EZ PLAIN DRY (MISCELLANEOUS) ×6 IMPLANT
CANISTER WOUND CARE 500ML ATS (WOUND CARE) ×3 IMPLANT
COVER SURGICAL LIGHT HANDLE (MISCELLANEOUS) ×6 IMPLANT
DRAPE HALF SHEET 40X57 (DRAPES) ×12 IMPLANT
DRAPE INCISE IOBAN 66X45 STRL (DRAPES) ×3 IMPLANT
DRAPE ORTHO SPLIT 77X108 STRL (DRAPES) ×1
DRAPE SURG ORHT 6 SPLT 77X108 (DRAPES) ×2 IMPLANT
DRAPE U-SHAPE 47X51 STRL (DRAPES) ×3 IMPLANT
DRESSING PEEL AND PLAC PRVNA20 (GAUZE/BANDAGES/DRESSINGS) ×2 IMPLANT
DRSG PEEL AND PLACE PREVENA 20 (GAUZE/BANDAGES/DRESSINGS) ×3
ELECT REM PT RETURN 9FT ADLT (ELECTROSURGICAL) ×3
ELECTRODE REM PT RTRN 9FT ADLT (ELECTROSURGICAL) ×2 IMPLANT
GLOVE BIO SURGEON STRL SZ7.5 (GLOVE) ×3 IMPLANT
GLOVE BIO SURGEON STRL SZ8 (GLOVE) ×3 IMPLANT
GLOVE BIOGEL PI IND STRL 7.5 (GLOVE) ×2 IMPLANT
GLOVE BIOGEL PI IND STRL 8 (GLOVE) ×2 IMPLANT
GLOVE BIOGEL PI IND STRL 9 (GLOVE) ×2 IMPLANT
GLOVE BIOGEL PI INDICATOR 7.5 (GLOVE) ×1
GLOVE BIOGEL PI INDICATOR 8 (GLOVE) ×1
GLOVE BIOGEL PI INDICATOR 9 (GLOVE) ×1
GOWN STRL REUS W/ TWL LRG LVL3 (GOWN DISPOSABLE) ×4 IMPLANT
GOWN STRL REUS W/ TWL XL LVL3 (GOWN DISPOSABLE) ×2 IMPLANT
GOWN STRL REUS W/TWL LRG LVL3 (GOWN DISPOSABLE) ×2
GOWN STRL REUS W/TWL XL LVL3 (GOWN DISPOSABLE) ×1
KIT BASIN OR (CUSTOM PROCEDURE TRAY) ×3 IMPLANT
KIT TURNOVER KIT B (KITS) ×3 IMPLANT
MANIFOLD NEPTUNE II (INSTRUMENTS) ×3 IMPLANT
NS IRRIG 1000ML POUR BTL (IV SOLUTION) ×3 IMPLANT
PACK GENERAL/GYN (CUSTOM PROCEDURE TRAY) ×6 IMPLANT
PAD ARMBOARD 7.5X6 YLW CONV (MISCELLANEOUS) ×6 IMPLANT
SET IRRIG Y TYPE TUR BLADDER L (SET/KITS/TRAYS/PACK) ×3 IMPLANT
SOL PREP POV-IOD 4OZ 10% (MISCELLANEOUS) ×3 IMPLANT
SOL PREP PROV IODINE SCRUB 4OZ (MISCELLANEOUS) ×3 IMPLANT
SUT ETHILON 2 0 PSLX (SUTURE) ×6 IMPLANT
SUT PDS AB 0 CT 36 (SUTURE) ×3 IMPLANT
SUT PDS AB 1 CT  36 (SUTURE) ×1
SUT PDS AB 1 CT 36 (SUTURE) ×2 IMPLANT
SUT PDS AB 1 CTX 36 (SUTURE) ×3 IMPLANT
SUT PDS AB 2-0 CT1 27 (SUTURE) ×3 IMPLANT
TOWEL GREEN STERILE FF (TOWEL DISPOSABLE) ×3 IMPLANT
UNDERPAD 30X30 (UNDERPADS AND DIAPERS) ×3 IMPLANT
WATER STERILE IRR 1000ML POUR (IV SOLUTION) ×3 IMPLANT

## 2019-07-20 NOTE — Addendum Note (Signed)
Addendum  created 07/20/19 1419 by Scheryl Darter, CRNA   Intraprocedure Meds edited

## 2019-07-20 NOTE — Op Note (Signed)
NAME: Bradley Cox, [place] MEDICAL RECORD [id number] ACCOUNT [id number] DATE OF BIRTH:08/20/1971 FACILITY: [facility] LOCATION: [place], [place]  OPERATIVE REPORT  DATE OF PROCEDURE:  07/20/2019  PREOPERATIVE DIAGNOSIS:  Right gluteal abscess.  POSTOPERATIVE DIAGNOSIS:  Right gluteal abscess.  PROCEDURES: 1.  Incision and drainage of right hip abscess with sharp excisional debridement of subcutaneous fat and deep fascia.   2.  Application of wound VAC.  SURGEON:  Altamese Muldraugh, MD  ASSISTANT:  Ainsley Spinner, PA-C.  ANESTHESIA:  General.  COMPLICATIONS:  None.  ESTIMATED BLOOD LOSS:  15 mL.  DISPOSITION:  To PACU.  CONDITION:  Stable.  BRIEF SUMMARY FOR PROCEDURE:  The patient is a 48 year old male with poorly controlled diabetes who developed recurrent abscess following a debridement last week with a large pocket of purulence.  He was treated with wound VAC application and antibiotic  beads and now presents for repeat washout, evaluation and possible closure versus further debridement.  I discussed with the patient preoperatively the risks and benefits including potential for persistent infection, recurrent infection, nerve injury,  vessel injury, scarring and potential need for packing and multiple others.  He did provide consent to proceed.  BRIEF SUMMARY OF PROCEDURE:  The patient was taken to the operating room where general anesthesia was induced.  He was positioned right side up with all prominences padded appropriately and an axillary roll.  Following a standard prep and drape, timeout  was held.  After removal of the VAC before prepping, we could identify a mostly healthy granulation tissue with an area of poorly vascularized tissue and fascia on the distal edge of the wound fairly centrally located.  This area was sharply debrided  with a scalpel, removing the subcutaneous fat and fascia deep.  We irrigated thoroughly and then performed a layered closure  using combination of PDS and nylon.  A sterile gently compressive dressing was applied.  In order to evacuate any further fluid  collection and to allow tension at the wound, a wound VAC was applied.  The patient awakened from anesthesia and transferred to the PACU in stable condition.  Ainsley Spinner, PA-C, was present and working throughout.    PROGNOSIS:  The patient will be weightbearing as tolerated.  Continue with IV antibiotics under the infectious disease service and follow up with either his initial treating surgeon, Dr. Doreatha Martin, or [place] in 2 weeks.  TN/NUANCE  D:07/20/2019 T:07/20/2019 JOB:[id number]

## 2019-07-20 NOTE — Brief Op Note (Signed)
07/20/2019  2:13 PM  PATIENT:  Bradley Cox  48 y.o. male  PRE-OPERATIVE DIAGNOSIS:  right gluteal deep abscess  POST-OPERATIVE DIAGNOSIS:  right gluteal deep abscess  PROCEDURE:  Procedure(s): 1. Irrigation And Debridement Hip (Right) with sharp excision of subcutaneous fat and deep fascia 2. Application Of Wound Vac (Right)  SURGEON:  Surgeon(s) and Role:    Altamese [place], MD - Primary  PHYSICIAN ASSISTANT: Ainsley Spinner, PA-C  ANESTHESIA:   general  EBL:  10 mL   BLOOD ADMINISTERED:none  DRAINS: None   LOCAL MEDICATIONS USED:  NONE  SPECIMEN:  No Specimen  DISPOSITION OF SPECIMEN:  N/A  COUNTS:  YES  TOURNIQUET:  * No tourniquets in log *  DICTATION: .Other Dictation: Dictation Number [id number]  PLAN OF CARE: Admit to inpatient   PATIENT DISPOSITION:  PACU - hemodynamically stable.   Delay start of Pharmacological VTE agent (>24hrs) due to surgical blood loss or risk of bleeding: no

## 2019-07-20 NOTE — Anesthesia Preprocedure Evaluation (Addendum)
Anesthesia Evaluation  Patient identified by MRN, date of birth, ID band Patient awake    Reviewed: Allergy & Precautions, H&P , NPO status , Patient's Chart, lab work & pertinent test results  Airway Mallampati: I  TM Distance: >3 FB Neck ROM: Full    Dental  (+) Teeth Intact, Poor Dentition, Dental Advisory Given   Pulmonary shortness of breath, asthma , sleep apnea ,  Smokeless tobacco Albuterol inhaler uses only 3-4x/year   Pulmonary exam normal breath sounds clear to auscultation       Cardiovascular hypertension, Pt. on medications Normal cardiovascular exam Rhythm:Regular Rate:Normal  TEE 07/11/19: r/o endocarditis in setting of bacteremia 1. The left ventricle has normal systolic function, with an ejection fraction of 60-65%. There is moderate concentric left ventricular hypertrophy. No evidence of left ventricular regional wall motion abnormalities.  2. The right ventricle has normal systolc function. The cavity was normal. There is no increase in right ventricular wall thickness.  3. Left atrial size was moderately dilated.  4. No evidence of a thrombus present in the left atrial appendage.  5. The aortic root, ascending aorta, aortic arch and descending aorta are normal in size and structure.  6. A linear 11 mm echodensity on the ventricular surface of the anterior leaflet is most likely a redundant chorda tendina and is unlikely to represent vegetation.    Neuro/Psych  Neuromuscular disease negative psych ROS   GI/Hepatic Neg liver ROS, GERD  ,dysphagia   Endo/Other  diabetes, Type 2, Insulin DependentMorbid obesity  Renal/GU   negative genitourinary   Musculoskeletal  (+) Arthritis , Osteoarthritis,    Abdominal (+) + obese,   Peds  Hematology negative hematology ROS (+)   Anesthesia Other Findings   Reproductive/Obstetrics                            Anesthesia  Physical  Anesthesia Plan  ASA: III  Anesthesia Plan: General   Post-op Pain Management:    Induction: Intravenous  PONV Risk Score and Plan: 2 and Treatment may vary due to age or medical condition, Ondansetron and Dexamethasone  Airway Management Planned: Oral ETT  Additional Equipment: None  Intra-op Plan:   Post-operative Plan: Extubation in OR  Informed Consent: I have reviewed the patients History and Physical, chart, labs and discussed the procedure including the risks, benefits and alternatives for the proposed anesthesia with the patient or authorized representative who has indicated his/her understanding and acceptance.     Dental advisory given  Plan Discussed with: CRNA  Anesthesia Plan Comments:         Anesthesia Quick Evaluation

## 2019-07-20 NOTE — Transfer of Care (Signed)
Immediate Anesthesia Transfer of Care Note  Patient: Bradley Cox  Procedure(s) Performed: Irrigation And Debridement Hip (Right Hip) Application Of Wound Vac (Right Hip)  Patient Location: PACU  Anesthesia Type:General  Level of Consciousness: awake, alert , oriented and sedated  Airway & Oxygen Therapy: Patient Spontanous Breathing and Patient connected to face mask oxygen  Post-op Assessment: Report given to RN, Post -op Vital signs reviewed and stable and Patient moving all extremities  Post vital signs: Reviewed and stable  Last Vitals:  Vitals Value Taken Time  BP 126/76 07/20/19 1003  Temp    Pulse    Resp 18 07/20/19 1008  SpO2    Vitals shown include unvalidated device data.  Last Pain:  Vitals:   07/20/19 1004  TempSrc:   PainSc: (P) 0-No pain      Patients Stated Pain Goal: 2 ([id number])  Complications: No apparent anesthesia complications

## 2019-07-20 NOTE — Anesthesia Postprocedure Evaluation (Addendum)
Anesthesia Post Note  Patient: Jill Poling  Procedure(s) Performed: Irrigation And Debridement Hip (Right Hip) Application Of Wound Vac (Right Hip)     Patient location during evaluation: PACU Anesthesia Type: General Level of consciousness: awake and alert Pain management: pain level controlled Vital Signs Assessment: post-procedure vital signs reviewed and stable Respiratory status: spontaneous breathing, nonlabored ventilation, respiratory function stable and patient connected to nasal cannula oxygen Cardiovascular status: blood pressure returned to baseline and stable Postop Assessment: no apparent nausea or vomiting Anesthetic complications: no    Last Vitals:  Vitals:   07/20/19 1048 07/20/19 1105  BP: 117/79 119/80  Pulse: 90 92  Resp: 16 18  Temp:  36.8 C  SpO2: 100% 92%    Last Pain:  Vitals:   07/20/19 1237  TempSrc:   PainSc: 9                  Jaheim Canino L Maylani Embree

## 2019-07-20 NOTE — Anesthesia Procedure Notes (Signed)
Procedure Name: Intubation Date/Time: 07/20/2019 8:32 AM Performed by: Scheryl Darter, CRNA Pre-anesthesia Checklist: Patient identified, Emergency Drugs available, Suction available and Patient being monitored Patient Re-evaluated:Patient Re-evaluated prior to induction Oxygen Delivery Method: Circle System Utilized Preoxygenation: Pre-oxygenation with 100% oxygen Induction Type: IV induction Ventilation: Mask ventilation without difficulty Laryngoscope Size: Mac and 4 Grade View: Grade I Tube type: Oral Tube size: 8.0 mm Number of attempts: 1 Airway Equipment and Method: Stylet and Oral airway Placement Confirmation: ETT inserted through vocal cords under direct vision,  positive ETCO2 and breath sounds checked- equal and bilateral Secured at: 23 cm Tube secured with: Tape Dental Injury: Teeth and Oropharynx as per pre-operative assessment

## 2019-07-20 NOTE — Progress Notes (Signed)
PROGRESS NOTE  HIEP RIPLEY [id number] DOB: [birth year]   PCP: Simona Huh, NP  Patient is from: Home  DOA: 07/05/2019 LOS: 72  Brief Narrative / Interim history: 48 year old male with history of obesity, asthma, hypertension, GERD, nephrolithiasis, diabetes mellitus who presented to ED on 07/06/2019 with low back pain radiating to the right gluteus/thigh with progressive decline in the right lower extremity strength, difficulty ambulation over 2 to 3 weeks.  On presentation he was found to be febrile, tachycardic, tachypneic.  Blood glucose was elevated with increased anion gap.  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  Patient was started on broad-spectrum antibiotics, insulin for DKA.  Neurosurgery, ID consulted.  Patient underwent IR guided his paraspinal abscess drainage.  Blood cultures now growing MSSA. Dr. Doreatha Martin removed 500 mL from a large right-sided gluteal abscess, then performed a neuroplasty of the right sciatic nerve on 07/09/2019. Currently on cefazolin.  Patient underwent TTE on 07/07/2019 followed by TEE on 07/11/2019 which showed no vegetations and normal ejection fraction and no thrombus.  Patient had episode of nausea and vomiting the night of 9/9.  Vomiting was nonbilious.  KUB obtained and revealed marked gaseous dilation of stomach and nonobstructive gas pattern and small and large bowel concerning for ileus. NG tube was placed for decompression.  However, NG tube came out the next morning.  Patient has not had further emesis.  Passing gas but has not has bowel movement.  Repeat abdominal x-ray on 07/15/2019 shows resolved ileus.  On 07/16/2019 is right hip/buttock wound started oozing significantly. Dr. Doreatha Martin and orthopedic team was contacted.  Initially his wound was thought to be nonpurulent.  He was evaluated again on 07/17/2019 and it became purulent and Ortho took him to  the OR and did another I&D of right gluteal abscess with drainage of 300 cc purulent fluid.  Repeat irrigation and debridement right hip and application of wound VAC on 07/20/19  Assessment & Plan: Sepsis/MSSA bacteremia/L3-S1 epidural abscess/right iliopsoas and gluteal abscess/right lower extremity cellulitis -Lower extremity Doppler negative for DVT. -Currently on cefazolin per ID.  Plan is 6 to 8 weeks of IV Ancef.  -Blood and tissue cultures on 9/4-growing MSSA. Repeat blood culture on 9/7 negative so far. -TTE and TEE did not show any vegetation or any thrombus.  . -PICC line 9/12.-PT/OT evaluated and they recommended CIR Status post another I&D of the right gluteal abscess with drainage of 300 cc purulent fluid on 07/17/2019.  Repeat irrigation and debridement right hip and application of wound VAC on 07/20/2019.  Doing better postoperatively.  Pain management per orthopedics.  Continue cefazolin.  Ileus/constipation/possible gastric outlet obstruction versus possible gastroparesis -Status post NG tube decompression of gastric gas.  -No further emesis.  Passing gas.  -Mobilize as able which has been difficult.  PRN medications for constipation.  Repeat abdominal KUB on 07/15/2019 does not show any ileus or SBO.  Uncontrolled NIDDM-2/DKA: DKA resolved. Hemoglobin A1c of 12.6.  Blood sugar controlled with some spikes of hyperglycemia intermittently.  Continue current NPH 23 units twice daily along with 8 units NovoLog pre-meal and SSI.  Hyponatremia: Mild and stable.  Check daily labs.  AKI:  Resolved   Hypokalemia:  Resolved.  Hypertension:   Normotensive. -Continue hydralazine scheduled and as needed.  GERD: Continue PPI  Fatty liver:  Check lipid profile.   May benefit from statins.    Morbid obesity:  BMI 42.6  Diet and exercise Further management on outpatient basis  DVT prophylaxis: Subcu Lovenox Code Status: Full code Family Communication: None present at  bedside.  Plan of care discussed with patient. Disposition Plan: Remains inpatient pending clinical improvement and clearance by consultants.  Final disposition CIR early next week pending improvement Consultants: Ortho trauma, infectious disease   Subjective: Seen and examined postoperatively.  Slightly lethargic.  Doing well.  No pain.  Objective: Vitals:   07/20/19 1030 07/20/19 1033 07/20/19 1048 07/20/19 1105  BP:  113/77 117/79 119/80  Pulse:  89 90 92  Resp:  18 16 18   Temp:    98.3 F (36.8 C)  TempSrc:    Oral  SpO2: 95% 97% 100% 92%  Weight:      Height:        Intake/Output Summary (Last 24 hours) at 07/20/2019 1637 Last data filed at 07/20/2019 1628 Gross per 24 hour  Intake 1963 ml  Output 9860 ml  Net -7897 ml   Filed Weights   07/05/19 1807 07/06/19 2116  Weight: (!) 167.4 kg (!) 164 kg    Examination:  General exam: Appears calm and comfortable, morbidly obese Respiratory system: Clear to auscultation. Respiratory effort normal. Cardiovascular system: S1 & S2 heard, RRR. No JVD, murmurs, rubs, gallops or clicks. No pedal edema. Gastrointestinal system: Abdomen is nondistended, soft and nontender. No organomegaly or masses felt. Normal bowel sounds heard. Central nervous system: Alert and oriented. No focal neurological deficits except what is mentioned below. Extremities: Symmetric 5 x 5 power except right lower extremity where he has 4/5 power. Skin: No rashes, lesions or ulcers.  Psychiatry: Judgement and insight appear normal. Mood & affect appropriate.   Procedures:  9/6-incision and drainage of paraspinal abscess 07/09/2019 neuroplasty of the right sciatic nerve by neurosurgery 9/9-TEE Incision and drainage of right gluteal abscess on 07/17/2019 Irrigation and debridement of right hip on 07/20/2019   Microbiology summarized: 9/4-SARS-CoV-2 screen negative 9/4-blood and tissue cultures-MSSA 9/7-blood cultures negative.  Antimicrobials:  Anti-infectives (From admission, onward)   Start     Dose/Rate Route Frequency Ordered Stop   07/17/19 1857  gentamicin (GARAMYCIN) injection  Status:  Discontinued       As needed 07/17/19 1857 07/17/19 2009   07/17/19 1857  vancomycin (VANCOCIN) powder  Status:  Discontinued       As needed 07/17/19 1858 07/17/19 2009   07/10/19 1600  metroNIDAZOLE (FLAGYL) IVPB 500 mg  Status:  Discontinued     500 mg 100 mL/hr over 60 Minutes Intravenous Every 8 hours 07/10/19 1549 07/13/19 0939   07/09/19 0839  vancomycin (VANCOCIN) powder  Status:  Discontinued       As needed 07/09/19 0839 07/09/19 0935   07/09/19 0839  tobramycin (NEBCIN) powder  Status:  Discontinued       As needed 07/09/19 0840 07/09/19 0935   07/07/19 1830  ceFAZolin (ANCEF) IVPB 2g/100 mL premix     2 g 200 mL/hr over 30 Minutes Intravenous Every 8 hours 07/07/19 1333     07/07/19 0900  ceFAZolin (ANCEF) IVPB 2g/100 mL premix  Status:  Discontinued     2 g 200 mL/hr over 30 Minutes Intravenous Every 8 hours 07/07/19 0847 07/07/19 1333   07/06/19 2030  clindamycin (CLEOCIN) IVPB 600 mg  Status:  Discontinued     600 mg 100 mL/hr over 30 Minutes Intravenous Every 8 hours 07/06/19 2010 07/07/19 0847   07/06/19 1800  vancomycin (VANCOCIN) 1,500 mg in sodium chloride 0.9 % 500  mL IVPB  Status:  Discontinued     1,500 mg 250 mL/hr over 120 Minutes Intravenous Every 12 hours 07/06/19 0820 07/07/19 0847   07/06/19 1400  ceFEPIme (MAXIPIME) 2 g in sodium chloride 0.9 % 100 mL IVPB  Status:  Discontinued     2 g 200 mL/hr over 30 Minutes Intravenous Every 8 hours 07/06/19 0504 07/07/19 0847   07/06/19 1400  vancomycin (VANCOCIN) 2,000 mg in sodium chloride 0.9 % 500 mL IVPB  Status:  Discontinued     2,000 mg 250 mL/hr over 120 Minutes Intravenous Every 8 hours 07/06/19 0504 07/06/19 0818   07/06/19 0745  clindamycin (CLEOCIN) IVPB 600 mg  Status:  Discontinued     600 mg 100 mL/hr over 30 Minutes Intravenous  Once 07/06/19 0737  07/06/19 2029   07/06/19 0500  ceFEPIme (MAXIPIME) 2 g in sodium chloride 0.9 % 100 mL IVPB     2 g 200 mL/hr over 30 Minutes Intravenous  Once 07/06/19 0451 07/06/19 1335   07/06/19 0500  vancomycin (VANCOCIN) 2,500 mg in sodium chloride 0.9 % 500 mL IVPB     2,500 mg 250 mL/hr over 120 Minutes Intravenous  Once 07/06/19 0451 07/06/19 1335      Sch Meds:  Scheduled Meds: . acetaminophen  500 mg Oral Q12H   Or  . acetaminophen  650 mg Rectal Q12H  . Chlorhexidine Gluconate Cloth  6 each Topical Daily  . enoxaparin (LOVENOX) injection  0.5 mg/kg Subcutaneous Q24H  . fentaNYL      . gabapentin  300 mg Oral TID  . hydrALAZINE  25 mg Oral BID  . influenza vac split quadrivalent PF  0.5 mL Intramuscular Tomorrow-1000  . insulin aspart  0-15 Units Subcutaneous TID WC  . insulin aspart  0-5 Units Subcutaneous QHS  . insulin aspart  8 Units Subcutaneous TID WC  . insulin NPH Human  23 Units Subcutaneous BID AC & HS  . methocarbamol  750 mg Oral TID  . metoCLOPramide  10 mg Oral TID AC & HS  . polyethylene glycol  17 g Oral BID  . senna-docusate  1 tablet Oral BID  . sodium chloride flush  10-40 mL Intracatheter Q12H  . sodium chloride flush  3 mL Intravenous Q12H   Continuous Infusions: .  ceFAZolin (ANCEF) IV 2 g (07/20/19 0206)  . lactated ringers     PRN Meds:.albuterol, fentaNYL (SUBLIMAZE) injection, hydrALAZINE, HYDROcodone-acetaminophen, ondansetron **OR** ondansetron (ZOFRAN) IV, sodium chloride flush, zolpidem   I have personally reviewed the following labs and images: CBC: Recent Labs  Lab 07/15/19 0504 07/17/19 0500 07/18/19 0422 07/19/19 0535 07/20/19 1353  WBC 12.7* 12.9* 11.5* 9.3 11.2*  NEUTROABS  --  10.1* 8.9* 7.3 10.4*  HGB 9.7* 9.7* 9.8* 9.2* 9.6*  HCT 30.5* 29.8* 31.8* 27.6* 30.2*  MCV 94.7 94.3 95.8 93.6 95.0  PLT 338 310 301 278 292   BMP &GFR Recent Labs  Lab 07/15/19 0504 07/18/19 0422 07/19/19 0535 07/20/19 1353  NA 133* 133* 131* 130*   K 4.1 4.1 3.8 4.3  CL 93* 93* 91* 94*  CO2 32 30 29 29   GLUCOSE 191* 133* 160* 357*  BUN <5* <5* <5* <5*  CREATININE 0.46* 0.56* 0.55* 0.51*  CALCIUM 8.1* 8.4* 8.2* 8.1*  MG 1.8 1.8  --   --    Estimated Creatinine Clearance: 188.1 mL/min (A) (by C-G formula based on SCr of 0.51 mg/dL (L)). Liver & Pancreas: Recent Labs  Lab 07/18/19 0422 07/19/19 0535  07/20/19 1353  AST 19 17 20   ALT 15 14 16   ALKPHOS 64 60 70  BILITOT 0.4 0.2* 0.3  PROT 6.0* 6.1* 6.5  ALBUMIN 1.5* 1.4* 1.6*   No results for input(s): LIPASE, AMYLASE in the last 168 hours. No results for input(s): AMMONIA in the last 168 hours. Diabetic: No results for input(s): HGBA1C in the last 72 hours. Recent Labs  Lab 07/19/19 1559 07/19/19 2118 07/20/19 0743 07/20/19 1003 07/20/19 1159  GLUCAP 128* 132* 116* 112* 232*   Cardiac Enzymes: No results for input(s): CKTOTAL, CKMB, CKMBINDEX, TROPONINI in the last 168 hours. No results for input(s): PROBNP in the last 8760 hours. Coagulation Profile: No results for input(s): INR, PROTIME in the last 168 hours. Thyroid Function Tests: No results for input(s): TSH, T4TOTAL, FREET4, T3FREE, THYROIDAB in the last 72 hours. Lipid Profile: No results for input(s): CHOL, HDL, LDLCALC, TRIG, CHOLHDL, LDLDIRECT in the last 72 hours. Anemia Panel: No results for input(s): VITAMINB12, FOLATE, FERRITIN, TIBC, IRON, RETICCTPCT in the last 72 hours. Urine analysis:    Component Value Date/Time   COLORURINE YELLOW 07/06/2019 [place] 07/06/2019 0437   LABSPEC 1.024 07/06/2019 0437   PHURINE 5.0 07/06/2019 0437   GLUCOSEU >=500 (A) 07/06/2019 0437   HGBUR MODERATE (A) 07/06/2019 0437   BILIRUBINUR NEGATIVE 07/06/2019 0437   KETONESUR 80 (A) 07/06/2019 0437   PROTEINUR NEGATIVE 07/06/2019 0437   NITRITE NEGATIVE 07/06/2019 0437   LEUKOCYTESUR NEGATIVE 07/06/2019 0437   Sepsis Labs: Invalid input(s): PROCALCITONIN, [place]  Microbiology: No  results found for this or any previous visit (from the past 240 hour(s)).  Radiology Studies: No results found.  Total time spent 26 minutes Ishmael Holter, M.D. [facility]  If 7PM-7AM, please contact night-coverage [email or web address] Password [facility] 07/20/2019, 4:37 PM

## 2019-07-21 ENCOUNTER — Encounter (HOSPITAL_COMMUNITY): Payer: Self-pay | Admitting: Orthopedic Surgery

## 2019-07-21 LAB — CBC WITH DIFFERENTIAL/PLATELET
Abs Immature Granulocytes: 0.06 10*3/uL (ref 0.00–0.07)
Basophils Absolute: 0 10*3/uL (ref 0.0–0.1)
Basophils Relative: 0 %
Eosinophils Absolute: 0 10*3/uL (ref 0.0–0.5)
Eosinophils Relative: 0 %
HCT: 28.4 % — ABNORMAL LOW (ref 39.0–52.0)
Hemoglobin: 9.2 g/dL — ABNORMAL LOW (ref 13.0–17.0)
Immature Granulocytes: 1 %
Lymphocytes Relative: 12 %
Lymphs Abs: 1.1 10*3/uL (ref 0.7–4.0)
MCH: 30.4 pg (ref 26.0–34.0)
MCHC: 32.4 g/dL (ref 30.0–36.0)
MCV: 93.7 fL (ref 80.0–100.0)
Monocytes Absolute: 0.4 10*3/uL (ref 0.1–1.0)
Monocytes Relative: 4 %
Neutro Abs: 7.7 10*3/uL (ref 1.7–7.7)
Neutrophils Relative %: 83 %
Platelets: 281 10*3/uL (ref 150–400)
RBC: 3.03 MIL/uL — ABNORMAL LOW (ref 4.22–5.81)
RDW: 13.1 % (ref 11.5–15.5)
WBC: 9.2 10*3/uL (ref 4.0–10.5)
nRBC: 0 % (ref 0.0–0.2)

## 2019-07-21 LAB — GLUCOSE, CAPILLARY
Glucose-Capillary: 287 mg/dL — ABNORMAL HIGH (ref 70–99)
Glucose-Capillary: 255 mg/dL — ABNORMAL HIGH (ref 70–99)
Glucose-Capillary: 237 mg/dL — ABNORMAL HIGH (ref 70–99)
Glucose-Capillary: 220 mg/dL — ABNORMAL HIGH (ref 70–99)

## 2019-07-21 MED ORDER — INSULIN NPH (HUMAN) (ISOPHANE) 100 UNIT/ML ~~LOC~~ SUSP
28.0000 [IU] | Freq: Two times a day (BID) | SUBCUTANEOUS | Status: DC
  Administered 2019-07-21 – 2019-08-16 (×51): 28 [IU] via SUBCUTANEOUS
  Filled 2019-07-21: qty 10

## 2019-07-21 NOTE — Progress Notes (Signed)
PROGRESS NOTE  Bradley Cox [id number] DOB: Oct 31, 1971   PCP: Simona Huh, NP  Patient is from: Home  DOA: 07/05/2019 LOS: 37  Brief Narrative / Interim history: 48 year old male with history of obesity, asthma, hypertension, GERD, nephrolithiasis, diabetes mellitus who presented to ED on 07/06/2019 with low back pain radiating to the right gluteus/thigh with progressive decline in the right lower extremity strength, difficulty ambulation over 2 to 3 weeks.  On presentation he was found to be febrile, tachycardic, tachypneic.  Blood glucose was elevated with increased anion gap.  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  Patient was started on broad-spectrum antibiotics, insulin for DKA.  Neurosurgery, ID consulted.  Patient underwent IR guided his paraspinal abscess drainage.  Blood cultures now growing MSSA. Dr. Doreatha Martin removed 500 mL from a large right-sided gluteal abscess, then performed a neuroplasty of the right sciatic nerve on 07/09/2019. Currently on cefazolin.  Patient underwent TTE on 07/07/2019 followed by TEE on 07/11/2019 which showed no vegetations and normal ejection fraction and no thrombus.  Patient had episode of nausea and vomiting the night of 9/9.  Vomiting was nonbilious.  KUB obtained and revealed marked gaseous dilation of stomach and nonobstructive gas pattern and small and large bowel concerning for ileus. NG tube was placed for decompression.  However, NG tube came out the next morning.  Patient has not had further emesis.  Passing gas but has not has bowel movement.  Repeat abdominal x-ray on 07/15/2019 shows resolved ileus.  On 07/16/2019 is right hip/buttock wound started oozing significantly. Dr. Doreatha Martin and orthopedic team was contacted.  Initially his wound was thought to be nonpurulent.  He was evaluated again on 07/17/2019 and it became purulent and Ortho took him to  the OR and did another I&D of right gluteal abscess with drainage of 300 cc purulent fluid.  Repeat irrigation and debridement right hip and application of wound VAC on 07/20/19  Assessment & Plan: Sepsis/MSSA bacteremia/L3-S1 epidural abscess/right iliopsoas and gluteal abscess/right lower extremity cellulitis -Lower extremity Doppler negative for DVT. -Currently on cefazolin per ID.  Plan is 6 to 8 weeks of IV Ancef.  -Blood and tissue cultures on 9/4-growing MSSA. Repeat blood culture on 9/7 negative so far. -TTE and TEE did not show any vegetation or any thrombus.  . -PICC line 9/12.-PT/OT evaluated and they recommended CIR Status post another I&D of the right gluteal abscess with drainage of 300 cc purulent fluid on 07/17/2019.  Repeat irrigation and debridement right hip and application of wound VAC on 07/20/2019.  Doing better.  Pain controlled.  Slightly motor strength in right lower extremity.  Ileus/constipation/possible gastric outlet obstruction versus possible gastroparesis -Status post NG tube decompression of gastric gas.  -No further emesis.  Passing gas.  -Mobilize as able which has been difficult.  PRN medications for constipation.  Repeat abdominal KUB on 07/15/2019 does not show any ileus or SBO.  Uncontrolled NIDDM-2/DKA: DKA resolved. Hemoglobin A1c of 12.6.  Blood sugar elevated more than 200, H2 28 units twice daily and continue 8 units NovoLog pre-meal and SSI.  Hyponatremia: Mild and stable.  Check daily labs.  AKI:  Resolved   Hypokalemia:  Resolved.  Hypertension:   Normotensive. -Continue hydralazine scheduled and as needed.  GERD: Continue PPI  Fatty liver:  Check lipid profile.   May benefit from statins.    Morbid obesity:  BMI 42.6 Diet and exercise Further  management on outpatient basis  DVT prophylaxis: Subcu Lovenox Code Status: Full code Family Communication: None present at bedside.  Plan of care discussed with patient. Disposition  Plan: Remains inpatient pending clinical improvement and clearance by consultants.  Final disposition CIR early next week pending improvement Consultants: Ortho trauma, infectious disease   Subjective: Seen and examined.  Pain controlled.  Slightly improved strength in right lower extremity.  Objective: Vitals:   07/20/19 2104 07/21/19 0024 07/21/19 0507 07/21/19 0834  BP: 124/84 121/83    Pulse: 84 83 74   Resp: 19 18    Temp: 97.8 F (36.6 C) 97.6 F (36.4 C) 97.9 F (36.6 C) 98.2 F (36.8 C)  TempSrc: Oral Oral Oral   SpO2:   99%   Weight:      Height:        Intake/Output Summary (Last 24 hours) at 07/21/2019 1043 Last data filed at 07/21/2019 [id number] Gross per 24 hour  Intake 1320 ml  Output 4950 ml  Net -3630 ml   Filed Weights   07/05/19 1807 07/06/19 2116  Weight: (!) 167.4 kg (!) 164 kg    Examination:  General exam: Appears calm and comfortable, morbidly obese Respiratory system: Clear to auscultation. Respiratory effort normal. Cardiovascular system: S1 & S2 heard, RRR. No JVD, murmurs, rubs, gallops or clicks. No pedal edema. Gastrointestinal system: Abdomen is nondistended, soft and nontender. No organomegaly or masses felt. Normal bowel sounds heard. Central nervous system: Alert and oriented. No focal neurological deficits. Extremities: Symmetric 5 x 5 power except right lower extremity which has 4/5 power.  Has wound VAC attached to the right hip. Skin: No rashes, lesions or ulcers.  Psychiatry: Judgement and insight appear normal. Mood & affect appropriate.    Procedures:  9/6-incision and drainage of paraspinal abscess 07/09/2019 neuroplasty of the right sciatic nerve by neurosurgery 9/9-TEE Incision and drainage of right gluteal abscess on 07/17/2019 Irrigation and debridement of right hip on 07/20/2019   Microbiology summarized: 9/4-SARS-CoV-2 screen negative 9/4-blood and tissue cultures-MSSA 9/7-blood cultures negative.  Antimicrobials:  Anti-infectives (From admission, onward)   Start     Dose/Rate Route Frequency Ordered Stop   07/17/19 1857  gentamicin (GARAMYCIN) injection  Status:  Discontinued       As needed 07/17/19 1857 07/17/19 2009   07/17/19 1857  vancomycin (VANCOCIN) powder  Status:  Discontinued       As needed 07/17/19 1858 07/17/19 2009   07/10/19 1600  metroNIDAZOLE (FLAGYL) IVPB 500 mg  Status:  Discontinued     500 mg 100 mL/hr over 60 Minutes Intravenous Every 8 hours 07/10/19 1549 07/13/19 0939   07/09/19 0839  vancomycin (VANCOCIN) powder  Status:  Discontinued       As needed 07/09/19 0839 07/09/19 0935   07/09/19 0839  tobramycin (NEBCIN) powder  Status:  Discontinued       As needed 07/09/19 0840 07/09/19 0935   07/07/19 1830  ceFAZolin (ANCEF) IVPB 2g/100 mL premix     2 g 200 mL/hr over 30 Minutes Intravenous Every 8 hours 07/07/19 1333     07/07/19 0900  ceFAZolin (ANCEF) IVPB 2g/100 mL premix  Status:  Discontinued     2 g 200 mL/hr over 30 Minutes Intravenous Every 8 hours 07/07/19 0847 07/07/19 1333   07/06/19 2030  clindamycin (CLEOCIN) IVPB 600 mg  Status:  Discontinued     600 mg 100 mL/hr over 30 Minutes Intravenous Every 8 hours 07/06/19 2010 07/07/19 0847   07/06/19 1800  vancomycin (VANCOCIN) 1,500 mg in sodium chloride 0.9 % 500 mL IVPB  Status:  Discontinued     1,500 mg 250 mL/hr over 120 Minutes Intravenous Every 12 hours 07/06/19 0820 07/07/19 0847   07/06/19 1400  ceFEPIme (MAXIPIME) 2 g in sodium chloride 0.9 % 100 mL IVPB  Status:  Discontinued     2 g 200 mL/hr over 30 Minutes Intravenous Every 8 hours 07/06/19 0504 07/07/19 0847   07/06/19 1400  vancomycin (VANCOCIN) 2,000 mg in sodium chloride 0.9 % 500 mL IVPB  Status:  Discontinued     2,000 mg 250 mL/hr over 120 Minutes Intravenous Every 8 hours 07/06/19 0504 07/06/19 0818   07/06/19 0745  clindamycin (CLEOCIN) IVPB 600 mg  Status:  Discontinued     600 mg 100 mL/hr over 30 Minutes Intravenous  Once 07/06/19 0737  07/06/19 2029   07/06/19 0500  ceFEPIme (MAXIPIME) 2 g in sodium chloride 0.9 % 100 mL IVPB     2 g 200 mL/hr over 30 Minutes Intravenous  Once 07/06/19 0451 07/06/19 1335   07/06/19 0500  vancomycin (VANCOCIN) 2,500 mg in sodium chloride 0.9 % 500 mL IVPB     2,500 mg 250 mL/hr over 120 Minutes Intravenous  Once 07/06/19 0451 07/06/19 1335      Sch Meds:  Scheduled Meds: . acetaminophen  500 mg Oral Q12H   Or  . acetaminophen  650 mg Rectal Q12H  . Chlorhexidine Gluconate Cloth  6 each Topical Daily  . enoxaparin (LOVENOX) injection  0.5 mg/kg Subcutaneous Q24H  . gabapentin  300 mg Oral TID  . hydrALAZINE  25 mg Oral BID  . influenza vac split quadrivalent PF  0.5 mL Intramuscular Tomorrow-1000  . insulin aspart  0-15 Units Subcutaneous TID WC  . insulin aspart  0-5 Units Subcutaneous QHS  . insulin aspart  8 Units Subcutaneous TID WC  . insulin NPH Human  28 Units Subcutaneous BID AC & HS  . methocarbamol  750 mg Oral TID  . metoCLOPramide  10 mg Oral TID AC & HS  . polyethylene glycol  17 g Oral BID  . senna-docusate  1 tablet Oral BID  . sodium chloride flush  10-40 mL Intracatheter Q12H  . sodium chloride flush  3 mL Intravenous Q12H   Continuous Infusions: .  ceFAZolin (ANCEF) IV 2 g (07/21/19 0959)  . lactated ringers     PRN Meds:.albuterol, fentaNYL (SUBLIMAZE) injection, hydrALAZINE, HYDROcodone-acetaminophen, ondansetron **OR** ondansetron (ZOFRAN) IV, sodium chloride flush, zolpidem   I have personally reviewed the following labs and images: CBC: Recent Labs  Lab 07/17/19 0500 07/18/19 0422 07/19/19 0535 07/20/19 1353 07/21/19 0600  WBC 12.9* 11.5* 9.3 11.2* 9.2  NEUTROABS 10.1* 8.9* 7.3 10.4* 7.7  HGB 9.7* 9.8* 9.2* 9.6* 9.2*  HCT 29.8* 31.8* 27.6* 30.2* 28.4*  MCV 94.3 95.8 93.6 95.0 93.7  PLT 310 301 278 292 281   BMP &GFR Recent Labs  Lab 07/15/19 0504 07/18/19 0422 07/19/19 0535 07/20/19 1353  NA 133* 133* 131* 130*  K 4.1 4.1 3.8 4.3   CL 93* 93* 91* 94*  CO2 32 30 29 29   GLUCOSE 191* 133* 160* 357*  BUN <5* <5* <5* <5*  CREATININE 0.46* 0.56* 0.55* 0.51*  CALCIUM 8.1* 8.4* 8.2* 8.1*  MG 1.8 1.8  --   --    Estimated Creatinine Clearance: 188.1 mL/min (A) (by C-G formula based on SCr of 0.51 mg/dL (L)). Liver & Pancreas: Recent Labs  Lab 07/18/19 0422 07/19/19  0535 07/20/19 1353  AST 19 17 20   ALT 15 14 16   ALKPHOS 64 60 70  BILITOT 0.4 0.2* 0.3  PROT 6.0* 6.1* 6.5  ALBUMIN 1.5* 1.4* 1.6*   No results for input(s): LIPASE, AMYLASE in the last 168 hours. No results for input(s): AMMONIA in the last 168 hours. Diabetic: No results for input(s): HGBA1C in the last 72 hours. Recent Labs  Lab 07/20/19 1003 07/20/19 1159 07/20/19 1655 07/20/19 2052 07/21/19 0833  GLUCAP 112* 232* 326* 291* 287*   Cardiac Enzymes: No results for input(s): CKTOTAL, CKMB, CKMBINDEX, TROPONINI in the last 168 hours. No results for input(s): PROBNP in the last 8760 hours. Coagulation Profile: No results for input(s): INR, PROTIME in the last 168 hours. Thyroid Function Tests: No results for input(s): TSH, T4TOTAL, FREET4, T3FREE, THYROIDAB in the last 72 hours. Lipid Profile: No results for input(s): CHOL, HDL, LDLCALC, TRIG, CHOLHDL, LDLDIRECT in the last 72 hours. Anemia Panel: No results for input(s): VITAMINB12, FOLATE, FERRITIN, TIBC, IRON, RETICCTPCT in the last 72 hours. Urine analysis:    Component Value Date/Time   COLORURINE YELLOW 07/06/2019 [place] 07/06/2019 0437   LABSPEC 1.024 07/06/2019 0437   PHURINE 5.0 07/06/2019 0437   GLUCOSEU >=500 (A) 07/06/2019 0437   HGBUR MODERATE (A) 07/06/2019 0437   BILIRUBINUR NEGATIVE 07/06/2019 0437   KETONESUR 80 (A) 07/06/2019 0437   PROTEINUR NEGATIVE 07/06/2019 0437   NITRITE NEGATIVE 07/06/2019 0437   LEUKOCYTESUR NEGATIVE 07/06/2019 0437   Sepsis Labs: Invalid input(s): PROCALCITONIN, [place]  Microbiology: No results found for this  or any previous visit (from the past 240 hour(s)).  Radiology Studies: No results found.  Total time spent 25 minutes Ishmael Holter, M.D. [facility]  If 7PM-7AM, please contact night-coverage [email or web address] Password [facility] 07/21/2019, 10:43 AM

## 2019-07-21 NOTE — Progress Notes (Signed)
Subjective: 1 Day Post-Op Procedure(s) (LRB): Irrigation And Debridement Hip (Right) Application Of Wound Vac (Right) Patient reports pain as moderate and severe.    Objective: Vital signs in last 24 hours: Temp:  [97.6 F (36.4 C)-98.2 F (36.8 C)] 98.2 F (36.8 C) (09/19 0834) Pulse Rate:  [74-88] 74 (09/19 0507) Resp:  [17-19] 18 (09/19 0024) BP: (109-124)/(72-84) 121/83 (09/19 0024) SpO2:  [96 %-99 %] 99 % (09/19 0507)  Intake/Output from previous day: 09/18 0701 - 09/19 0700 In: 1980 [P.O.:1080; I.V.:700; IV Piggyback:200] Out: 5510 [Urine:5500; Blood:10] Intake/Output this shift: Total I/O In: 240 [P.O.:240] Out: -   Recent Labs    07/19/19 0535 07/20/19 1353 07/21/19 0600  HGB 9.2* 9.6* 9.2*   Recent Labs    07/20/19 1353 07/21/19 0600  WBC 11.2* 9.2  RBC 3.18* 3.03*  HCT 30.2* 28.4*  PLT 292 281   Recent Labs    07/19/19 0535 07/20/19 1353  NA 131* 130*  K 3.8 4.3  CL 91* 94*  CO2 29 29  BUN <5* <5*  CREATININE 0.55* 0.51*  GLUCOSE 160* 357*  CALCIUM 8.2* 8.1*   No results for input(s): LABPT, INR in the last 72 hours.  Neurovascular intact Sensation intact distally Incision: dressing C/D/I and vac in place and working , no drainage  No cellulitis present   Assessment/Plan: 1 Day Post-Op Procedure(s) (LRB): Irrigation And Debridement Hip (Right) Application Of Wound Vac (Right)  48 y/o malewith multifocal abscessess/p I&Dright gluteal abscess  - Rgluteal abscesss/pserial I&D (MSSA) Continue with VAC WBAT R leg Ice prn   - Pain management: norco             Gabapentin   If not working well may change in po pain med per primary team  - ID: Per infectious disease   Chriss Czar 07/21/2019, 11:07 AM

## 2019-07-22 LAB — CBC WITH DIFFERENTIAL/PLATELET
Abs Immature Granulocytes: 0.05 10*3/uL (ref 0.00–0.07)
Basophils Absolute: 0 10*3/uL (ref 0.0–0.1)
Basophils Relative: 0 %
Eosinophils Absolute: 0.1 10*3/uL (ref 0.0–0.5)
Eosinophils Relative: 1 %
HCT: 28.7 % — ABNORMAL LOW (ref 39.0–52.0)
Hemoglobin: 9 g/dL — ABNORMAL LOW (ref 13.0–17.0)
Immature Granulocytes: 1 %
Lymphocytes Relative: 15 %
Lymphs Abs: 1.4 10*3/uL (ref 0.7–4.0)
MCH: 29.7 pg (ref 26.0–34.0)
MCHC: 31.4 g/dL (ref 30.0–36.0)
MCV: 94.7 fL (ref 80.0–100.0)
Monocytes Absolute: 0.6 10*3/uL (ref 0.1–1.0)
Monocytes Relative: 6 %
Neutro Abs: 7.5 10*3/uL (ref 1.7–7.7)
Neutrophils Relative %: 77 %
Platelets: 285 10*3/uL (ref 150–400)
RBC: 3.03 MIL/uL — ABNORMAL LOW (ref 4.22–5.81)
RDW: 13.1 % (ref 11.5–15.5)
WBC: 9.6 10*3/uL (ref 4.0–10.5)
nRBC: 0 % (ref 0.0–0.2)

## 2019-07-22 LAB — GLUCOSE, CAPILLARY
Glucose-Capillary: 139 mg/dL — ABNORMAL HIGH (ref 70–99)
Glucose-Capillary: 118 mg/dL — ABNORMAL HIGH (ref 70–99)
Glucose-Capillary: 87 mg/dL (ref 70–99)
Glucose-Capillary: 152 mg/dL — ABNORMAL HIGH (ref 70–99)

## 2019-07-22 NOTE — Progress Notes (Addendum)
PROGRESS NOTE  Bradley Cox [id number] DOB: 02-05-1971   PCP: Simona Huh, NP  Patient is from: Home  DOA: 07/05/2019 LOS: 30  Brief Narrative / Interim history: 48 year old male with history of obesity, asthma, hypertension, GERD, nephrolithiasis, diabetes mellitus who presented to ED on 07/06/2019 with low back pain radiating to the right gluteus/thigh with progressive decline in the right lower extremity strength, difficulty ambulation over 2 to 3 weeks.  On presentation he was found to be febrile, tachycardic, tachypneic.  Blood glucose was elevated with increased anion gap.  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  Patient was started on broad-spectrum antibiotics, insulin for DKA.  Neurosurgery, ID consulted.  Patient underwent IR guided his paraspinal abscess drainage.  Blood cultures now growing MSSA. Dr. Doreatha Martin removed 500 mL from a large right-sided gluteal abscess, then performed a neuroplasty of the right sciatic nerve on 07/09/2019. Currently on cefazolin.  Patient underwent TTE on 07/07/2019 followed by TEE on 07/11/2019 which showed no vegetations and normal ejection fraction and no thrombus.  Patient had episode of nausea and vomiting the night of 9/9.  Vomiting was nonbilious.  KUB obtained and revealed marked gaseous dilation of stomach and nonobstructive gas pattern and small and large bowel concerning for ileus. NG tube was placed for decompression.  However, NG tube came out the next morning.  Patient has not had further emesis.  Passing gas but has not has bowel movement.  Repeat abdominal x-ray on 07/15/2019 shows resolved ileus.  On 07/16/2019 is right hip/buttock wound started oozing significantly. Dr. Doreatha Martin and orthopedic team was contacted.  Initially his wound was thought to be nonpurulent.  He was evaluated again on 07/17/2019 and it became purulent and Ortho took him to  the OR and did another I&D of right gluteal abscess with drainage of 300 cc purulent fluid.  Repeat irrigation and debridement right hip and application of wound VAC on 07/20/19  Assessment & Plan: Sepsis/MSSA bacteremia/L3-S1 epidural abscess/right iliopsoas and gluteal abscess/right lower extremity cellulitis -Lower extremity Doppler negative for DVT. -Currently on cefazolin per ID.  Plan is 6 to 8 weeks of IV Ancef.  -Blood and tissue cultures on 9/4-growing MSSA. Repeat blood culture on 9/7 negative so far. -TTE and TEE did not show any vegetation or any thrombus.  . -PICC line 9/12.-PT/OT evaluated and they recommended CIR Status post another I&D of the right gluteal abscess with drainage of 300 cc purulent fluid on 07/17/2019.  Repeat irrigation and debridement right hip and application of wound VAC on 07/20/2019.  Doing better.  Pain controlled.  Slightly improved motor strength in right lower extremity.  Ileus/constipation/possible gastric outlet obstruction versus possible gastroparesis -Status post NG tube decompression of gastric gas.  -No further emesis.  Passing gas.  -Mobilize as able which has been difficult.  PRN medications for constipation.  Repeat abdominal KUB on 07/15/2019 does not show any ileus or SBO.  Uncontrolled NIDDM-2/DKA: DKA resolved. Hemoglobin A1c of 12.6.  Blood sugar now much better.  Continue Lantus 28 units twice daily and continue 8 units NovoLog pre-meal and SSI.  Hyponatremia: Mild and stable.  Check daily labs.  AKI:  Resolved   Hypokalemia:  Resolved.  Hypertension:   Normotensive. -Continue hydralazine scheduled and as needed.  GERD: Continue PPI  Fatty liver:  Check lipid profile.   May benefit from statins.    Morbid obesity:  BMI 42.6 Diet and  exercise Further management on outpatient basis  DVT prophylaxis: Subcu Lovenox Code Status: Full code Family Communication: None present at bedside.  Plan of care discussed with  patient. Disposition Plan: Remains inpatient pending clinical improvement and clearance by consultants.  Final disposition CIR early next week pending improvement Consultants: Ortho trauma, infectious disease   Subjective: Patient seen and examined.  No new complaint.  Pain well controlled.  Increasing strength in right lower extremity.  Objective: Vitals:   07/21/19 2350 07/22/19 0410 07/22/19 0800 07/22/19 1225  BP: 125/85 (!) 139/96  128/67  Pulse: 89 77 83   Resp: 18 (!) 21    Temp: 98.1 F (36.7 C) 97.7 F (36.5 C)  98 F (36.7 C)  TempSrc: Oral Oral    SpO2: 97% 100% 98%   Weight:      Height:        Intake/Output Summary (Last 24 hours) at 07/22/2019 1305 Last data filed at 07/22/2019 0552 Gross per 24 hour  Intake -  Output 8825 ml  Net -8825 ml   Filed Weights   07/05/19 1807 07/06/19 2116  Weight: (!) 167.4 kg (!) 164 kg    Examination:  General exam: Appears calm and comfortable, morbidly obese Respiratory system: Clear to auscultation. Respiratory effort normal. Cardiovascular system: S1 & S2 heard, RRR. No JVD, murmurs, rubs, gallops or clicks. No pedal edema. Gastrointestinal system: Abdomen is nondistended, soft and nontender. No organomegaly or masses felt. Normal bowel sounds heard. Central nervous system: Alert and oriented.  4 and half/5 power in right lower extremity.  Improved compared to yesterday. Skin: No rashes, lesions or ulcers.  Wound VAC attached to the right hip area Psychiatry: Judgement and insight appear normal. Mood & affect appropriate.  Procedures:  9/6-incision and drainage of paraspinal abscess 07/09/2019 neuroplasty of the right sciatic nerve by neurosurgery 9/9-TEE Incision and drainage of right gluteal abscess on 07/17/2019 Irrigation and debridement of right hip on 07/20/2019   Microbiology summarized: 9/4-SARS-CoV-2 screen negative 9/4-blood and tissue cultures-MSSA 9/7-blood cultures negative.  Antimicrobials:  Anti-infectives (From admission, onward)   Start     Dose/Rate Route Frequency Ordered Stop   07/17/19 1857  gentamicin (GARAMYCIN) injection  Status:  Discontinued       As needed 07/17/19 1857 07/17/19 2009   07/17/19 1857  vancomycin (VANCOCIN) powder  Status:  Discontinued       As needed 07/17/19 1858 07/17/19 2009   07/10/19 1600  metroNIDAZOLE (FLAGYL) IVPB 500 mg  Status:  Discontinued     500 mg 100 mL/hr over 60 Minutes Intravenous Every 8 hours 07/10/19 1549 07/13/19 0939   07/09/19 0839  vancomycin (VANCOCIN) powder  Status:  Discontinued       As needed 07/09/19 0839 07/09/19 0935   07/09/19 0839  tobramycin (NEBCIN) powder  Status:  Discontinued       As needed 07/09/19 0840 07/09/19 0935   07/07/19 1830  ceFAZolin (ANCEF) IVPB 2g/100 mL premix     2 g 200 mL/hr over 30 Minutes Intravenous Every 8 hours 07/07/19 1333     07/07/19 0900  ceFAZolin (ANCEF) IVPB 2g/100 mL premix  Status:  Discontinued     2 g 200 mL/hr over 30 Minutes Intravenous Every 8 hours 07/07/19 0847 07/07/19 1333   07/06/19 2030  clindamycin (CLEOCIN) IVPB 600 mg  Status:  Discontinued     600 mg 100 mL/hr over 30 Minutes Intravenous Every 8 hours 07/06/19 2010 07/07/19 0847   07/06/19 1800  vancomycin (VANCOCIN)  1,500 mg in sodium chloride 0.9 % 500 mL IVPB  Status:  Discontinued     1,500 mg 250 mL/hr over 120 Minutes Intravenous Every 12 hours 07/06/19 0820 07/07/19 0847   07/06/19 1400  ceFEPIme (MAXIPIME) 2 g in sodium chloride 0.9 % 100 mL IVPB  Status:  Discontinued     2 g 200 mL/hr over 30 Minutes Intravenous Every 8 hours 07/06/19 0504 07/07/19 0847   07/06/19 1400  vancomycin (VANCOCIN) 2,000 mg in sodium chloride 0.9 % 500 mL IVPB  Status:  Discontinued     2,000 mg 250 mL/hr over 120 Minutes Intravenous Every 8 hours 07/06/19 0504 07/06/19 0818   07/06/19 0745  clindamycin (CLEOCIN) IVPB 600 mg  Status:  Discontinued     600 mg 100 mL/hr over 30 Minutes Intravenous  Once 07/06/19 0737  07/06/19 2029   07/06/19 0500  ceFEPIme (MAXIPIME) 2 g in sodium chloride 0.9 % 100 mL IVPB     2 g 200 mL/hr over 30 Minutes Intravenous  Once 07/06/19 0451 07/06/19 1335   07/06/19 0500  vancomycin (VANCOCIN) 2,500 mg in sodium chloride 0.9 % 500 mL IVPB     2,500 mg 250 mL/hr over 120 Minutes Intravenous  Once 07/06/19 0451 07/06/19 1335      Sch Meds:  Scheduled Meds: . acetaminophen  500 mg Oral Q12H   Or  . acetaminophen  650 mg Rectal Q12H  . Chlorhexidine Gluconate Cloth  6 each Topical Daily  . enoxaparin (LOVENOX) injection  0.5 mg/kg Subcutaneous Q24H  . gabapentin  300 mg Oral TID  . hydrALAZINE  25 mg Oral BID  . influenza vac split quadrivalent PF  0.5 mL Intramuscular Tomorrow-1000  . insulin aspart  0-15 Units Subcutaneous TID WC  . insulin aspart  0-5 Units Subcutaneous QHS  . insulin aspart  8 Units Subcutaneous TID WC  . insulin NPH Human  28 Units Subcutaneous BID AC & HS  . methocarbamol  750 mg Oral TID  . metoCLOPramide  10 mg Oral TID AC & HS  . polyethylene glycol  17 g Oral BID  . senna-docusate  1 tablet Oral BID  . sodium chloride flush  10-40 mL Intracatheter Q12H  . sodium chloride flush  3 mL Intravenous Q12H   Continuous Infusions: .  ceFAZolin (ANCEF) IV 2 g (07/22/19 1105)  . lactated ringers     PRN Meds:.albuterol, fentaNYL (SUBLIMAZE) injection, hydrALAZINE, HYDROcodone-acetaminophen, ondansetron **OR** ondansetron (ZOFRAN) IV, sodium chloride flush, zolpidem   I have personally reviewed the following labs and images: CBC: Recent Labs  Lab 07/18/19 0422 07/19/19 0535 07/20/19 1353 07/21/19 0600 07/22/19 0256  WBC 11.5* 9.3 11.2* 9.2 9.6  NEUTROABS 8.9* 7.3 10.4* 7.7 7.5  HGB 9.8* 9.2* 9.6* 9.2* 9.0*  HCT 31.8* 27.6* 30.2* 28.4* 28.7*  MCV 95.8 93.6 95.0 93.7 94.7  PLT 301 278 292 281 285   BMP &GFR Recent Labs  Lab 07/18/19 0422 07/19/19 0535 07/20/19 1353  NA 133* 131* 130*  K 4.1 3.8 4.3  CL 93* 91* 94*  CO2 30 29  29   GLUCOSE 133* 160* 357*  BUN <5* <5* <5*  CREATININE 0.56* 0.55* 0.51*  CALCIUM 8.4* 8.2* 8.1*  MG 1.8  --   --    Estimated Creatinine Clearance: 188.1 mL/min (A) (by C-G formula based on SCr of 0.51 mg/dL (L)). Liver & Pancreas: Recent Labs  Lab 07/18/19 0422 07/19/19 0535 07/20/19 1353  AST 19 17 20   ALT 15 14 16  ALKPHOS 64 60 70  BILITOT 0.4 0.2* 0.3  PROT 6.0* 6.1* 6.5  ALBUMIN 1.5* 1.4* 1.6*   No results for input(s): LIPASE, AMYLASE in the last 168 hours. No results for input(s): AMMONIA in the last 168 hours. Diabetic: No results for input(s): HGBA1C in the last 72 hours. Recent Labs  Lab 07/21/19 1139 07/21/19 1633 07/21/19 2139 07/22/19 0736 07/22/19 1224  GLUCAP 255* 237* 220* 139* 118*   Cardiac Enzymes: No results for input(s): CKTOTAL, CKMB, CKMBINDEX, TROPONINI in the last 168 hours. No results for input(s): PROBNP in the last 8760 hours. Coagulation Profile: No results for input(s): INR, PROTIME in the last 168 hours. Thyroid Function Tests: No results for input(s): TSH, T4TOTAL, FREET4, T3FREE, THYROIDAB in the last 72 hours. Lipid Profile: No results for input(s): CHOL, HDL, LDLCALC, TRIG, CHOLHDL, LDLDIRECT in the last 72 hours. Anemia Panel: No results for input(s): VITAMINB12, FOLATE, FERRITIN, TIBC, IRON, RETICCTPCT in the last 72 hours. Urine analysis:    Component Value Date/Time   COLORURINE YELLOW 07/06/2019 [place] 07/06/2019 0437   LABSPEC 1.024 07/06/2019 0437   PHURINE 5.0 07/06/2019 0437   GLUCOSEU >=500 (A) 07/06/2019 0437   HGBUR MODERATE (A) 07/06/2019 0437   BILIRUBINUR NEGATIVE 07/06/2019 0437   KETONESUR 80 (A) 07/06/2019 0437   PROTEINUR NEGATIVE 07/06/2019 0437   NITRITE NEGATIVE 07/06/2019 0437   LEUKOCYTESUR NEGATIVE 07/06/2019 0437   Sepsis Labs: Invalid input(s): PROCALCITONIN, [place]  Microbiology: No results found for this or any previous visit (from the past 240 hour(s)).   Radiology Studies: No results found.  Total time spent 26 minutes Ishmael Holter, M.D. [facility]  If 7PM-7AM, please contact night-coverage [email or web address] Password [email or web address] 07/22/2019, 1:05 PM

## 2019-07-23 LAB — CBC WITH DIFFERENTIAL/PLATELET
Abs Immature Granulocytes: 0.07 10*3/uL (ref 0.00–0.07)
Basophils Absolute: 0 10*3/uL (ref 0.0–0.1)
Basophils Relative: 0 %
Eosinophils Absolute: 0.1 10*3/uL (ref 0.0–0.5)
Eosinophils Relative: 1 %
HCT: 30.3 % — ABNORMAL LOW (ref 39.0–52.0)
Hemoglobin: 9.3 g/dL — ABNORMAL LOW (ref 13.0–17.0)
Immature Granulocytes: 1 %
Lymphocytes Relative: 14 %
Lymphs Abs: 1.3 10*3/uL (ref 0.7–4.0)
MCH: 29.8 pg (ref 26.0–34.0)
MCHC: 30.7 g/dL (ref 30.0–36.0)
MCV: 97.1 fL (ref 80.0–100.0)
Monocytes Absolute: 0.6 10*3/uL (ref 0.1–1.0)
Monocytes Relative: 6 %
Neutro Abs: 7.3 10*3/uL (ref 1.7–7.7)
Neutrophils Relative %: 78 %
Platelets: 277 10*3/uL (ref 150–400)
RBC: 3.12 MIL/uL — ABNORMAL LOW (ref 4.22–5.81)
RDW: 13.5 % (ref 11.5–15.5)
WBC: 9.4 10*3/uL (ref 4.0–10.5)
nRBC: 0 % (ref 0.0–0.2)

## 2019-07-23 LAB — GLUCOSE, CAPILLARY
Glucose-Capillary: 194 mg/dL — ABNORMAL HIGH (ref 70–99)
Glucose-Capillary: 190 mg/dL — ABNORMAL HIGH (ref 70–99)
Glucose-Capillary: 127 mg/dL — ABNORMAL HIGH (ref 70–99)
Glucose-Capillary: 93 mg/dL (ref 70–99)

## 2019-07-23 LAB — BASIC METABOLIC PANEL
Anion gap: 9 (ref 5–15)
BUN: <5 mg/dL — ABNORMAL LOW (ref 6–20)
CO2: 32 mmol/L (ref 22–32)
Calcium: 8.8 mg/dL — ABNORMAL LOW (ref 8.9–10.3)
Chloride: 94 mmol/L — ABNORMAL LOW (ref 98–111)
Creatinine, Ser: 0.49 mg/dL — ABNORMAL LOW (ref 0.61–1.24)
GFR calc Af Amer: >60 mL/min (ref >60)
GFR calc non Af Amer: >60 mL/min (ref >60)
Glucose, Bld: 187 mg/dL — ABNORMAL HIGH (ref 70–99)
Potassium: 4.1 mmol/L (ref 3.5–5.1)
Sodium: 135 mmol/L (ref 135–145)

## 2019-07-23 MED ORDER — PHENYLEPHRINE HCL-NACL 10-0.9 MG/250ML-% IV SOLN
INTRAVENOUS | Status: AC
  Filled 2019-07-23: qty 500

## 2019-07-23 NOTE — Progress Notes (Signed)
PROGRESS NOTE  Bradley Cox [id number] DOB: 1971-01-10   PCP: Simona Huh, NP  Patient is from: Home  DOA: 07/05/2019 LOS: 29  Brief Narrative / Interim history: 48 year old male with history of obesity, asthma, hypertension, GERD, nephrolithiasis, diabetes mellitus who presented to ED on 07/06/2019 with low back pain radiating to the right gluteus/thigh with progressive decline in the right lower extremity strength, difficulty ambulation over 2 to 3 weeks.  On presentation he was found to be febrile, tachycardic, tachypneic.  Blood glucose was elevated with increased anion gap.  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  Patient was started on broad-spectrum antibiotics, insulin for DKA.  Neurosurgery, ID consulted.  Patient underwent IR guided his paraspinal abscess drainage.  Blood cultures now growing MSSA. Dr. Doreatha Martin removed 500 mL from a large right-sided gluteal abscess, then performed a neuroplasty of the right sciatic nerve on 07/09/2019. Currently on cefazolin.  Patient underwent TTE on 07/07/2019 followed by TEE on 07/11/2019 which showed no vegetations and normal ejection fraction and no thrombus.  Patient had episode of nausea and vomiting the night of 9/9.  Vomiting was nonbilious.  KUB obtained and revealed marked gaseous dilation of stomach and nonobstructive gas pattern and small and large bowel concerning for ileus. NG tube was placed for decompression.  However, NG tube came out the next morning.  Patient has not had further emesis.  Passing gas but has not has bowel movement.  Repeat abdominal x-ray on 07/15/2019 shows resolved ileus.  On 07/16/2019 is right hip/buttock wound started oozing significantly. Dr. Doreatha Martin and orthopedic team was contacted.  Initially his wound was thought to be nonpurulent.  He was evaluated again on 07/17/2019 and it became purulent and Ortho took him to  the OR and did another I&D of right gluteal abscess with drainage of 300 cc purulent fluid.  Repeat irrigation and debridement right hip and application of wound VAC on 07/20/19  Assessment & Plan: Sepsis/MSSA bacteremia/L3-S1 epidural abscess/right iliopsoas and gluteal abscess/right lower extremity cellulitis -Lower extremity Doppler negative for DVT. -Currently on cefazolin per ID.  Plan is 6 to 8 weeks of IV Ancef.  -Blood and tissue cultures on 9/4-growing MSSA. Repeat blood culture on 9/7 negative so far. -TTE and TEE did not show any vegetation or any thrombus.  . -PICC line 9/12.-PT/OT evaluated and they recommended CIR Status post another I&D of the right gluteal abscess with drainage of 300 cc purulent fluid on 07/17/2019.  Repeat irrigation and debridement right hip and application of wound VAC on 07/20/2019.  Doing better.  Pain controlled.  Ileus/constipation/possible gastric outlet obstruction versus possible gastroparesis -Status post NG tube decompression of gastric gas.  -No further emesis.  Passing gas.  -Mobilize as able which has been difficult.  PRN medications for constipation.  Repeat abdominal KUB on 07/15/2019 does not show any ileus or SBO.  Uncontrolled NIDDM-2/DKA: DKA resolved. Hemoglobin A1c of 12.6.  Blood sugar now much better.  Continue Lantus 28 units twice daily and continue 8 units NovoLog pre-meal and SSI.  Hyponatremia: Mild and stable.  Check daily labs.  AKI:  Resolved   Hypokalemia:  Resolved.  Hypertension:   Normotensive. -Continue hydralazine scheduled and as needed.  GERD: Continue PPI  Fatty liver:  Check lipid profile.   May benefit from statins.    Morbid obesity:  BMI 42.6 Diet and exercise Further management on outpatient basis  DVT prophylaxis:  Subcu Lovenox Code Status: Full code Family Communication: None present at bedside.  Plan of care discussed with patient. Disposition Plan: Remains inpatient pending clearance by  consultants.  Plan for CIR sometimes this week.  They are already consulted. Consultants: Ortho trauma, infectious disease   Subjective: Seen and examined.  Pain well controlled.  No new complaint.  Objective: Vitals:   07/23/19 0015 07/23/19 0330 07/23/19 0343 07/23/19 0800  BP:   119/81 132/86  Pulse: 90 97  85  Resp: 16 17    Temp:   98.1 F (36.7 C) 97.6 F (36.4 C)  TempSrc:   Oral Oral  SpO2: 99% 98%  95%  Weight:      Height:        Intake/Output Summary (Last 24 hours) at 07/23/2019 1027 Last data filed at 07/23/2019 [id number] Gross per 24 hour  Intake 10 ml  Output 7675 ml  Net -7665 ml   Filed Weights   07/05/19 1807 07/06/19 2116  Weight: (!) 167.4 kg (!) 164 kg    Examination:  General exam: Appears calm and comfortable, morbidly obese Respiratory system: Clear to auscultation. Respiratory effort normal. Cardiovascular system: S1 & S2 heard, RRR. No JVD, murmurs, rubs, gallops or clicks. No pedal edema. Gastrointestinal system: Abdomen is nondistended, soft and nontender. No organomegaly or masses felt. Normal bowel sounds heard. Central nervous system: Alert and oriented.  4 1/2 power in right lower extremity. Extremities: Symmetric 5 x 5 power. Skin: No rashes, lesions or ulcers.  Wound VAC attached to the right hip area. Psychiatry: Judgement and insight appear normal. Mood & affect appropriate.   Procedures:  9/6-incision and drainage of paraspinal abscess 07/09/2019 neuroplasty of the right sciatic nerve by neurosurgery 9/9-TEE Incision and drainage of right gluteal abscess on 07/17/2019 Irrigation and debridement of right hip on 07/20/2019   Microbiology summarized: 9/4-SARS-CoV-2 screen negative 9/4-blood and tissue cultures-MSSA 9/7-blood cultures negative.  Antimicrobials: Anti-infectives (From admission, onward)   Start     Dose/Rate Route Frequency Ordered Stop   07/17/19 1857  gentamicin (GARAMYCIN) injection  Status:  Discontinued       As  needed 07/17/19 1857 07/17/19 2009   07/17/19 1857  vancomycin (VANCOCIN) powder  Status:  Discontinued       As needed 07/17/19 1858 07/17/19 2009   07/10/19 1600  metroNIDAZOLE (FLAGYL) IVPB 500 mg  Status:  Discontinued     500 mg 100 mL/hr over 60 Minutes Intravenous Every 8 hours 07/10/19 1549 07/13/19 0939   07/09/19 0839  vancomycin (VANCOCIN) powder  Status:  Discontinued       As needed 07/09/19 0839 07/09/19 0935   07/09/19 0839  tobramycin (NEBCIN) powder  Status:  Discontinued       As needed 07/09/19 0840 07/09/19 0935   07/07/19 1830  ceFAZolin (ANCEF) IVPB 2g/100 mL premix     2 g 200 mL/hr over 30 Minutes Intravenous Every 8 hours 07/07/19 1333     07/07/19 0900  ceFAZolin (ANCEF) IVPB 2g/100 mL premix  Status:  Discontinued     2 g 200 mL/hr over 30 Minutes Intravenous Every 8 hours 07/07/19 0847 07/07/19 1333   07/06/19 2030  clindamycin (CLEOCIN) IVPB 600 mg  Status:  Discontinued     600 mg 100 mL/hr over 30 Minutes Intravenous Every 8 hours 07/06/19 2010 07/07/19 0847   07/06/19 1800  vancomycin (VANCOCIN) 1,500 mg in sodium chloride 0.9 % 500 mL IVPB  Status:  Discontinued     1,500 mg  250 mL/hr over 120 Minutes Intravenous Every 12 hours 07/06/19 0820 07/07/19 0847   07/06/19 1400  ceFEPIme (MAXIPIME) 2 g in sodium chloride 0.9 % 100 mL IVPB  Status:  Discontinued     2 g 200 mL/hr over 30 Minutes Intravenous Every 8 hours 07/06/19 0504 07/07/19 0847   07/06/19 1400  vancomycin (VANCOCIN) 2,000 mg in sodium chloride 0.9 % 500 mL IVPB  Status:  Discontinued     2,000 mg 250 mL/hr over 120 Minutes Intravenous Every 8 hours 07/06/19 0504 07/06/19 0818   07/06/19 0745  clindamycin (CLEOCIN) IVPB 600 mg  Status:  Discontinued     600 mg 100 mL/hr over 30 Minutes Intravenous  Once 07/06/19 0737 07/06/19 2029   07/06/19 0500  ceFEPIme (MAXIPIME) 2 g in sodium chloride 0.9 % 100 mL IVPB     2 g 200 mL/hr over 30 Minutes Intravenous  Once 07/06/19 0451 07/06/19 1335    07/06/19 0500  vancomycin (VANCOCIN) 2,500 mg in sodium chloride 0.9 % 500 mL IVPB     2,500 mg 250 mL/hr over 120 Minutes Intravenous  Once 07/06/19 0451 07/06/19 1335      Sch Meds:  Scheduled Meds: . acetaminophen  500 mg Oral Q12H   Or  . acetaminophen  650 mg Rectal Q12H  . Chlorhexidine Gluconate Cloth  6 each Topical Daily  . enoxaparin (LOVENOX) injection  0.5 mg/kg Subcutaneous Q24H  . gabapentin  300 mg Oral TID  . hydrALAZINE  25 mg Oral BID  . influenza vac split quadrivalent PF  0.5 mL Intramuscular Tomorrow-1000  . insulin aspart  0-15 Units Subcutaneous TID WC  . insulin aspart  0-5 Units Subcutaneous QHS  . insulin aspart  8 Units Subcutaneous TID WC  . insulin NPH Human  28 Units Subcutaneous BID AC & HS  . methocarbamol  750 mg Oral TID  . metoCLOPramide  10 mg Oral TID AC & HS  . polyethylene glycol  17 g Oral BID  . senna-docusate  1 tablet Oral BID  . sodium chloride flush  10-40 mL Intracatheter Q12H  . sodium chloride flush  3 mL Intravenous Q12H   Continuous Infusions: .  ceFAZolin (ANCEF) IV 2 g (07/23/19 [id number])  . lactated ringers     PRN Meds:.albuterol, fentaNYL (SUBLIMAZE) injection, hydrALAZINE, HYDROcodone-acetaminophen, ondansetron **OR** ondansetron (ZOFRAN) IV, sodium chloride flush, zolpidem   I have personally reviewed the following labs and images: CBC: Recent Labs  Lab 07/19/19 0535 07/20/19 1353 07/21/19 0600 07/22/19 0256 07/23/19 0500  WBC 9.3 11.2* 9.2 9.6 9.4  NEUTROABS 7.3 10.4* 7.7 7.5 7.3  HGB 9.2* 9.6* 9.2* 9.0* 9.3*  HCT 27.6* 30.2* 28.4* 28.7* 30.3*  MCV 93.6 95.0 93.7 94.7 97.1  PLT 278 292 281 285 277   BMP &GFR Recent Labs  Lab 07/18/19 0422 07/19/19 0535 07/20/19 1353 07/23/19 0800  NA 133* 131* 130* 135  K 4.1 3.8 4.3 4.1  CL 93* 91* 94* 94*  CO2 30 29 29  32  GLUCOSE 133* 160* 357* 187*  BUN <5* <5* <5* <5*  CREATININE 0.56* 0.55* 0.51* 0.49*  CALCIUM 8.4* 8.2* 8.1* 8.8*  MG 1.8  --   --   --     Estimated Creatinine Clearance: 188.1 mL/min (A) (by C-G formula based on SCr of 0.49 mg/dL (L)). Liver & Pancreas: Recent Labs  Lab 07/18/19 0422 07/19/19 0535 07/20/19 1353  AST 19 17 20   ALT 15 14 16   ALKPHOS 64 60 70  BILITOT  0.4 0.2* 0.3  PROT 6.0* 6.1* 6.5  ALBUMIN 1.5* 1.4* 1.6*   No results for input(s): LIPASE, AMYLASE in the last 168 hours. No results for input(s): AMMONIA in the last 168 hours. Diabetic: No results for input(s): HGBA1C in the last 72 hours. Recent Labs  Lab 07/22/19 0736 07/22/19 1224 07/22/19 1619 07/22/19 2107 07/23/19 0903  GLUCAP 139* 118* 87 152* 194*   Cardiac Enzymes: No results for input(s): CKTOTAL, CKMB, CKMBINDEX, TROPONINI in the last 168 hours. No results for input(s): PROBNP in the last 8760 hours. Coagulation Profile: No results for input(s): INR, PROTIME in the last 168 hours. Thyroid Function Tests: No results for input(s): TSH, T4TOTAL, FREET4, T3FREE, THYROIDAB in the last 72 hours. Lipid Profile: No results for input(s): CHOL, HDL, LDLCALC, TRIG, CHOLHDL, LDLDIRECT in the last 72 hours. Anemia Panel: No results for input(s): VITAMINB12, FOLATE, FERRITIN, TIBC, IRON, RETICCTPCT in the last 72 hours. Urine analysis:    Component Value Date/Time   COLORURINE YELLOW 07/06/2019 [place] 07/06/2019 0437   LABSPEC 1.024 07/06/2019 0437   PHURINE 5.0 07/06/2019 0437   GLUCOSEU >=500 (A) 07/06/2019 0437   HGBUR MODERATE (A) 07/06/2019 0437   BILIRUBINUR NEGATIVE 07/06/2019 0437   KETONESUR 80 (A) 07/06/2019 0437   PROTEINUR NEGATIVE 07/06/2019 0437   NITRITE NEGATIVE 07/06/2019 0437   LEUKOCYTESUR NEGATIVE 07/06/2019 0437   Sepsis Labs: Invalid input(s): PROCALCITONIN, [place]  Microbiology: No results found for this or any previous visit (from the past 240 hour(s)).  Radiology Studies: No results found.  Total time spent 25 minutes Ishmael Holter, M.D. [facility]  If 7PM-7AM, please  contact night-coverage [email or web address] Password [facility] 07/23/2019, 10:27 AM

## 2019-07-23 NOTE — Progress Notes (Signed)
Physical Therapy Treatment Patient Details Name: Bradley Cox MRN: [id number] DOB: Oct 17, 1971 Today's Date: 07/23/2019    History of Present Illness Patient is a 48 y/o male who presents with right flank pain. Admitted with disseminated MSSA infection including bacteremia, Lumbosacral spinal epidural abscess, paraspinal/large gluteal/pelvic abscess with sciatica impingement. s/p I&D large gluteal abscess and neuroplasty of sciatica nerve 9/7. s/p CT aspiration right paraspinal abscess. PMH includes DM, HTN, morbid obesity, OSA.    PT Comments    Pt received in bed, agreeable to participation in therapy. He required min assist bed mobility, mod assist transfers, and min assist ambulation 5' with RW. Pt presents with antalgic gait. Distance limited by pain. +2 for safety/line management. Pt positioned in recliner with feet elevated at end of session.    Follow Up Recommendations  CIR;Supervision for mobility/OOB;Supervision/Assistance - 24 hour     Equipment Recommendations  Rolling walker with 5" wheels;3in1 (PT)    Recommendations for Other Services       Precautions / Restrictions Precautions Precautions: Fall;Other (comment) Precaution Comments: wound vac R hip Restrictions RLE Weight Bearing: Weight bearing as tolerated    Mobility  Bed Mobility Overal bed mobility: Needs Assistance Bed Mobility: Supine to Sit     Supine to sit: Min assist;HOB elevated     General bed mobility comments: heavy UE support, increased time and effort  Transfers Overall transfer level: Needs assistance Equipment used: Rolling walker (2 wheeled) Transfers: Sit to/from Stand Sit to Stand: From elevated surface;Mod assist;+2 safety/equipment         General transfer comment: assist to power up, increased time to stabilize initial standing balance  Ambulation/Gait Ambulation/Gait assistance: +2 safety/equipment;Min assist Gait Distance (Feet): 5 Feet Assistive device: Rolling  walker (2 wheeled) Gait Pattern/deviations: Step-to pattern;Decreased weight shift to right;Antalgic Gait velocity: decreased   General Gait Details: distance limited by pain   Stairs             Wheelchair Mobility    Modified Rankin (Stroke Patients Only)       Balance Overall balance assessment: Needs assistance Sitting-balance support: Feet supported;No upper extremity supported Sitting balance-Leahy Scale: Fair     Standing balance support: Bilateral upper extremity supported;During functional activity Standing balance-Leahy Scale: Poor Standing balance comment: reliant on BUE during funcitonal mobility                            Cognition Arousal/Alertness: Awake/alert Behavior During Therapy: WFL for tasks assessed/performed Overall Cognitive Status: Within Functional Limits for tasks assessed                                        Exercises      General Comments        Pertinent Vitals/Pain Pain Assessment: 0-10 Pain Score: 8  Pain Location: RLE Pain Descriptors / Indicators: Grimacing;Guarding;Sore Pain Intervention(s): Limited activity within patient's tolerance;Repositioned;Patient requesting pain meds-RN notified    Home Living                      Prior Function            PT Goals (current goals can now be found in the care plan section) Acute Rehab PT Goals Patient Stated Goal: to get back home PT Goal Formulation: With patient Time For Goal Achievement: 07/24/19 Potential to Achieve Goals:  Good Progress towards PT goals: Progressing toward goals    Frequency    Min 3X/week      PT Plan Current plan remains appropriate    Co-evaluation              AM-PAC PT "6 Clicks" Mobility   Outcome Measure  Help needed turning from your back to your side while in a flat bed without using bedrails?: A Little Help needed moving from lying on your back to sitting on the side of a flat bed  without using bedrails?: A Little Help needed moving to and from a bed to a chair (including a wheelchair)?: A Lot Help needed standing up from a chair using your arms (e.g., wheelchair or bedside chair)?: A Lot Help needed to walk in hospital room?: A Lot Help needed climbing 3-5 steps with a railing? : Total 6 Click Score: 13    End of Session Equipment Utilized During Treatment: Gait belt Activity Tolerance: Patient limited by pain Patient left: in chair;with call bell/phone within reach Nurse Communication: Mobility status;Patient requests pain meds PT Visit Diagnosis: Muscle weakness (generalized) (M62.81);Pain;Unsteadiness on feet (R26.81);Difficulty in walking, not elsewhere classified (R26.2) Pain - Right/Left: Right Pain - part of body: Hip     Time: [id number] PT Time Calculation (min) (ACUTE ONLY): 24 min  Charges:  $Gait Training: 8-22 mins $Therapeutic Activity: 8-22 mins                     Bradley Cox, PT  Office # [phone] Pager [phone]    Lorriane Shire 07/23/2019, 12:30 PM

## 2019-07-23 NOTE — TOC Progression Note (Signed)
Transition of Care [facility]) - Progression Note    Patient Details  Name: Bradley Cox MRN: [id number] Date of Birth: Aug 01, 1971  [facility] of [facility]) CM/SW [place], [place] Phone Number: 07/23/2019, 4:21 PM  Clinical Narrative:     CSW visited with the patient at bedside.  CSW advised patient of  no bed offers and  placement barrier. Patient states he understands. Patient expressed his goal is to be able to stand and at least walk a few feet before going home. Patient requested to contact his mother to explore placement options  and provide update. CSW called 2x and left voice messages.   Thurmond Butts, MSW, LCSWA Clinical Social Worker [phone]    Barriers to Discharge: Continued Medical Work up, Inadequate or no insurance, No SNF bed, SNF Pending bed offer  Expected Discharge Plan and Services   In-house Referral: Clinical Social Work     Living arrangements for the past 2 months: Single Family Home                                       Social Determinants of Health (SDOH) Interventions    Readmission Risk Interventions No flowsheet data found.

## 2019-07-24 LAB — CBC WITH DIFFERENTIAL/PLATELET
Abs Immature Granulocytes: 0.05 10*3/uL (ref 0.00–0.07)
Basophils Absolute: 0 10*3/uL (ref 0.0–0.1)
Basophils Relative: 0 %
Eosinophils Absolute: 0.1 10*3/uL (ref 0.0–0.5)
Eosinophils Relative: 1 %
HCT: 30.5 % — ABNORMAL LOW (ref 39.0–52.0)
Hemoglobin: 9.6 g/dL — ABNORMAL LOW (ref 13.0–17.0)
Immature Granulocytes: 1 %
Lymphocytes Relative: 17 %
Lymphs Abs: 1.7 10*3/uL (ref 0.7–4.0)
MCH: 30.2 pg (ref 26.0–34.0)
MCHC: 31.5 g/dL (ref 30.0–36.0)
MCV: 95.9 fL (ref 80.0–100.0)
Monocytes Absolute: 0.5 10*3/uL (ref 0.1–1.0)
Monocytes Relative: 5 %
Neutro Abs: 7.6 10*3/uL (ref 1.7–7.7)
Neutrophils Relative %: 76 %
Platelets: 276 10*3/uL (ref 150–400)
RBC: 3.18 MIL/uL — ABNORMAL LOW (ref 4.22–5.81)
RDW: 13.6 % (ref 11.5–15.5)
WBC: 10 10*3/uL (ref 4.0–10.5)
nRBC: 0 % (ref 0.0–0.2)

## 2019-07-24 LAB — GLUCOSE, CAPILLARY
Glucose-Capillary: 170 mg/dL — ABNORMAL HIGH (ref 70–99)
Glucose-Capillary: 102 mg/dL — ABNORMAL HIGH (ref 70–99)
Glucose-Capillary: 146 mg/dL — ABNORMAL HIGH (ref 70–99)
Glucose-Capillary: 167 mg/dL — ABNORMAL HIGH (ref 70–99)

## 2019-07-24 LAB — BASIC METABOLIC PANEL
Anion gap: 10 (ref 5–15)
BUN: 5 mg/dL — ABNORMAL LOW (ref 6–20)
CO2: 31 mmol/L (ref 22–32)
Calcium: 8.5 mg/dL — ABNORMAL LOW (ref 8.9–10.3)
Chloride: 93 mmol/L — ABNORMAL LOW (ref 98–111)
Creatinine, Ser: 0.52 mg/dL — ABNORMAL LOW (ref 0.61–1.24)
GFR calc Af Amer: >60 mL/min (ref >60)
GFR calc non Af Amer: >60 mL/min (ref >60)
Glucose, Bld: 102 mg/dL — ABNORMAL HIGH (ref 70–99)
Potassium: 3.8 mmol/L (ref 3.5–5.1)
Sodium: 134 mmol/L — ABNORMAL LOW (ref 135–145)

## 2019-07-24 NOTE — Progress Notes (Signed)
RN reached out to [facility] for instructions on dressing changes for small puncture like wound, above wound vac. Hospitalist asked RN to reach out to ortho if they had not been notified already and to continue dressing changes PRN. RN called ortho trauma service office,  providers were not in the office today or at the hospital today. RN will continue with dressing changes PRN and place sticky note in Pt chart for providers.

## 2019-07-24 NOTE — Progress Notes (Signed)
Orthopaedic Trauma Service  S:    Doing better overall    Still trying to figure out where he will dc to   O:  BP 108/72 (BP Location: Left Arm)   Pulse 97   Temp 99.1 F (37.3 C) (Oral)   Resp (!) 23   Ht 6\' 6"  (1.981 m)   Wt (!) 164 kg   SpO2 96%   BMI 41.78 kg/m   Gen: pleasant, sitting up in bed, NAD       Lips with slight pallor  Right Lower Extremity    Incisional vac with good seal  New dressing to only drain site intact    Improving motor and sensory functions   No appreciable erythema to R thigh  Swelling markedly improved to R thigh, minimal to no pitting to R thigh appreciated today    Ext warm   A/P  S/p serial I&D R thigh/hip for Deep abscess, multifocal abscesses, uncontrolled DM  Incisional vac to remain on until Thursday  Continue with dressing changes as needed to old drain site  Up with therapy  WBAT R leg  No ROM restrictions   Jari Pigg, PA-C [phone] Loletha Grayer) 07/24/2019, 3:24 PM  [facility] [place] [address] [phone] (F)

## 2019-07-24 NOTE — Progress Notes (Signed)
PROGRESS NOTE  Bradley Cox [id number] DOB: 1971/08/10   PCP: Simona Huh, NP  Patient is from: Home  DOA: 07/05/2019 LOS: 61  Brief Narrative / Interim history: 48 year old male with history of obesity, asthma, hypertension, GERD, nephrolithiasis, diabetes mellitus who presented to ED on 07/06/2019 with low back pain radiating to the right gluteus/thigh with progressive decline in the right lower extremity strength, difficulty ambulation over 2 to 3 weeks.  On presentation he was found to be febrile, tachycardic, tachypneic.  Blood glucose was elevated with increased anion gap.  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  Patient was started on broad-spectrum antibiotics, insulin for DKA.  Neurosurgery, ID consulted.  Patient underwent IR guided his paraspinal abscess drainage.  Blood cultures now growing MSSA. Dr. Doreatha Martin removed 500 mL from a large right-sided gluteal abscess, then performed a neuroplasty of the right sciatic nerve on 07/09/2019. Currently on cefazolin.  Patient underwent TTE on 07/07/2019 followed by TEE on 07/11/2019 which showed no vegetations and normal ejection fraction and no thrombus.  Patient had episode of nausea and vomiting the night of 9/9.  Vomiting was nonbilious.  KUB obtained and revealed marked gaseous dilation of stomach and nonobstructive gas pattern and small and large bowel concerning for ileus. NG tube was placed for decompression.  However, NG tube came out the next morning.  Patient has not had further emesis.  Passing gas but has not has bowel movement.  Repeat abdominal x-ray on 07/15/2019 shows resolved ileus.  On 07/16/2019 is right hip/buttock wound started oozing significantly. Dr. Doreatha Martin and orthopedic team was contacted.  Initially his wound was thought to be nonpurulent.  He was evaluated again on 07/17/2019 and it became purulent and Ortho took him to  the OR and did another I&D of right gluteal abscess with drainage of 300 cc purulent fluid.  Repeat irrigation and debridement right hip and application of wound VAC on 07/20/19  Assessment & Plan: Sepsis/MSSA bacteremia/L3-S1 epidural abscess/right iliopsoas and gluteal abscess/right lower extremity cellulitis -Lower extremity Doppler negative for DVT. -Currently on cefazolin per ID.  Plan is 6 to 8 weeks of IV Ancef.  -Blood and tissue cultures on 9/4-growing MSSA. Repeat blood culture on 9/7 negative so far. -TTE and TEE did not show any vegetation or any thrombus.  . -PICC line 9/12.-PT/OT evaluated and they recommended CIR Status post another I&D of the right gluteal abscess with drainage of 300 cc purulent fluid on 07/17/2019.  Repeat irrigation and debridement right hip and application of wound VAC on 07/20/2019.  Doing better.  Pain controlled.  Apparently, patient has a very small puncture wound just above the wound VAC area.  This is being dressed as needed.  I advised RN to reach out to orthopedics to see if they have any further recommendations.  Ileus/constipation/possible gastric outlet obstruction versus possible gastroparesis -Status post NG tube decompression of gastric gas.  -No further emesis.  Passing gas.  -Mobilize as able which has been difficult.  PRN medications for constipation.  Repeat abdominal KUB on 07/15/2019 does not show any ileus or SBO.  Uncontrolled NIDDM-2/DKA: DKA resolved. Hemoglobin A1c of 12.6.  Blood sugar now much better.  Continue Lantus 28 units twice daily and continue 8 units NovoLog pre-meal and SSI.  Hyponatremia: Mild and stable.  Check daily labs.  AKI:  Resolved   Hypokalemia:  Resolved.  Hypertension:   Normotensive. -Continue hydralazine scheduled  and as needed.  GERD: Continue PPI  Fatty liver:  Check lipid profile.   May benefit from statins.    Morbid obesity:  BMI 42.6 Diet and exercise Further management on outpatient  basis  DVT prophylaxis: Subcu Lovenox Code Status: Full code Family Communication: None present at bedside.  Plan of care discussed with patient. Disposition Plan: Remains inpatient pending clearance by orthopedics.  Does not meet criteria for CIR now.  Does not have insurance so SNF might be difficult option.  Considering home health care. Consultants: Ortho trauma, infectious disease   Subjective: Patient seen and examined.  Pain is very well controlled.  He is gaining his right lower extremity strength at a slow pace.  No new complaint.  He is still trying to figure out what he is going to do now that he does not meet criteria for CIR.  Objective: Vitals:   07/23/19 2203 07/24/19 0000 07/24/19 0012 07/24/19 0406  BP:   117/74 112/71  Pulse: (!) 105 95 94 98  Resp: 20 19    Temp:   98.3 F (36.8 C) 99.3 F (37.4 C)  TempSrc:   Oral Oral  SpO2: 96% 94% 95% 93%  Weight:      Height:        Intake/Output Summary (Last 24 hours) at 07/24/2019 0834 Last data filed at 07/24/2019 0600 Gross per 24 hour  Intake 1523.5 ml  Output 4075 ml  Net -2551.5 ml   Filed Weights   07/05/19 1807 07/06/19 2116  Weight: (!) 167.4 kg (!) 164 kg    Examination:  General exam: Appears calm and comfortable, morbidly obese Respiratory system: Clear to auscultation. Respiratory effort normal. Cardiovascular system: S1 & S2 heard, RRR. No JVD, murmurs, rubs, gallops or clicks. No pedal edema. Gastrointestinal system: Abdomen is nondistended, soft and nontender. No organomegaly or masses felt. Normal bowel sounds heard. Central nervous system: Alert and oriented. No focal neurological deficits. Skin: Wound VAC attached to the right hip. 4 1/2 power in right lower extremity. Psychiatry: Judgement and insight appear normal. Mood & affect appropriate.   Procedures:  9/6-incision and drainage of paraspinal abscess 07/09/2019 neuroplasty of the right sciatic nerve by neurosurgery 9/9-TEE Incision  and drainage of right gluteal abscess on 07/17/2019 Irrigation and debridement of right hip on 07/20/2019   Microbiology summarized: 9/4-SARS-CoV-2 screen negative 9/4-blood and tissue cultures-MSSA 9/7-blood cultures negative.  Antimicrobials: Anti-infectives (From admission, onward)   Start     Dose/Rate Route Frequency Ordered Stop   07/17/19 1857  gentamicin (GARAMYCIN) injection  Status:  Discontinued       As needed 07/17/19 1857 07/17/19 2009   07/17/19 1857  vancomycin (VANCOCIN) powder  Status:  Discontinued       As needed 07/17/19 1858 07/17/19 2009   07/10/19 1600  metroNIDAZOLE (FLAGYL) IVPB 500 mg  Status:  Discontinued     500 mg 100 mL/hr over 60 Minutes Intravenous Every 8 hours 07/10/19 1549 07/13/19 0939   07/09/19 0839  vancomycin (VANCOCIN) powder  Status:  Discontinued       As needed 07/09/19 0839 07/09/19 0935   07/09/19 0839  tobramycin (NEBCIN) powder  Status:  Discontinued       As needed 07/09/19 0840 07/09/19 0935   07/07/19 1830  ceFAZolin (ANCEF) IVPB 2g/100 mL premix     2 g 200 mL/hr over 30 Minutes Intravenous Every 8 hours 07/07/19 1333     07/07/19 0900  ceFAZolin (ANCEF) IVPB 2g/100 mL premix  Status:  Discontinued     2 g 200 mL/hr over 30 Minutes Intravenous Every 8 hours 07/07/19 0847 07/07/19 1333   07/06/19 2030  clindamycin (CLEOCIN) IVPB 600 mg  Status:  Discontinued     600 mg 100 mL/hr over 30 Minutes Intravenous Every 8 hours 07/06/19 2010 07/07/19 0847   07/06/19 1800  vancomycin (VANCOCIN) 1,500 mg in sodium chloride 0.9 % 500 mL IVPB  Status:  Discontinued     1,500 mg 250 mL/hr over 120 Minutes Intravenous Every 12 hours 07/06/19 0820 07/07/19 0847   07/06/19 1400  ceFEPIme (MAXIPIME) 2 g in sodium chloride 0.9 % 100 mL IVPB  Status:  Discontinued     2 g 200 mL/hr over 30 Minutes Intravenous Every 8 hours 07/06/19 0504 07/07/19 0847   07/06/19 1400  vancomycin (VANCOCIN) 2,000 mg in sodium chloride 0.9 % 500 mL IVPB  Status:   Discontinued     2,000 mg 250 mL/hr over 120 Minutes Intravenous Every 8 hours 07/06/19 0504 07/06/19 0818   07/06/19 0745  clindamycin (CLEOCIN) IVPB 600 mg  Status:  Discontinued     600 mg 100 mL/hr over 30 Minutes Intravenous  Once 07/06/19 0737 07/06/19 2029   07/06/19 0500  ceFEPIme (MAXIPIME) 2 g in sodium chloride 0.9 % 100 mL IVPB     2 g 200 mL/hr over 30 Minutes Intravenous  Once 07/06/19 0451 07/06/19 1335   07/06/19 0500  vancomycin (VANCOCIN) 2,500 mg in sodium chloride 0.9 % 500 mL IVPB     2,500 mg 250 mL/hr over 120 Minutes Intravenous  Once 07/06/19 0451 07/06/19 1335      Sch Meds:  Scheduled Meds: . acetaminophen  500 mg Oral Q12H   Or  . acetaminophen  650 mg Rectal Q12H  . Chlorhexidine Gluconate Cloth  6 each Topical Daily  . enoxaparin (LOVENOX) injection  0.5 mg/kg Subcutaneous Q24H  . gabapentin  300 mg Oral TID  . hydrALAZINE  25 mg Oral BID  . influenza vac split quadrivalent PF  0.5 mL Intramuscular Tomorrow-1000  . insulin aspart  0-15 Units Subcutaneous TID WC  . insulin aspart  0-5 Units Subcutaneous QHS  . insulin aspart  8 Units Subcutaneous TID WC  . insulin NPH Human  28 Units Subcutaneous BID AC & HS  . methocarbamol  750 mg Oral TID  . metoCLOPramide  10 mg Oral TID AC & HS  . polyethylene glycol  17 g Oral BID  . senna-docusate  1 tablet Oral BID  . sodium chloride flush  10-40 mL Intracatheter Q12H  . sodium chloride flush  3 mL Intravenous Q12H   Continuous Infusions: .  ceFAZolin (ANCEF) IV Stopped (07/24/19 0353)  . lactated ringers     PRN Meds:.albuterol, fentaNYL (SUBLIMAZE) injection, hydrALAZINE, HYDROcodone-acetaminophen, ondansetron **OR** ondansetron (ZOFRAN) IV, sodium chloride flush, zolpidem   I have personally reviewed the following labs and images: CBC: Recent Labs  Lab 07/20/19 1353 07/21/19 0600 07/22/19 0256 07/23/19 0500 07/24/19 0325  WBC 11.2* 9.2 9.6 9.4 10.0  NEUTROABS 10.4* 7.7 7.5 7.3 7.6  HGB 9.6*  9.2* 9.0* 9.3* 9.6*  HCT 30.2* 28.4* 28.7* 30.3* 30.5*  MCV 95.0 93.7 94.7 97.1 95.9  PLT 292 281 285 277 276   BMP &GFR Recent Labs  Lab 07/18/19 0422 07/19/19 0535 07/20/19 1353 07/23/19 0800 07/24/19 0325  NA 133* 131* 130* 135 134*  K 4.1 3.8 4.3 4.1 3.8  CL 93* 91* 94* 94* 93*  CO2 30 29 29  32  31  GLUCOSE 133* 160* 357* 187* 102*  BUN <5* <5* <5* <5* 5*  CREATININE 0.56* 0.55* 0.51* 0.49* 0.52*  CALCIUM 8.4* 8.2* 8.1* 8.8* 8.5*  MG 1.8  --   --   --   --    Estimated Creatinine Clearance: 188.1 mL/min (A) (by C-G formula based on SCr of 0.52 mg/dL (L)). Liver & Pancreas: Recent Labs  Lab 07/18/19 0422 07/19/19 0535 07/20/19 1353  AST 19 17 20   ALT 15 14 16   ALKPHOS 64 60 70  BILITOT 0.4 0.2* 0.3  PROT 6.0* 6.1* 6.5  ALBUMIN 1.5* 1.4* 1.6*   No results for input(s): LIPASE, AMYLASE in the last 168 hours. No results for input(s): AMMONIA in the last 168 hours. Diabetic: No results for input(s): HGBA1C in the last 72 hours. Recent Labs  Lab 07/22/19 2107 07/23/19 0903 07/23/19 1205 07/23/19 1620 07/23/19 2103  GLUCAP 152* 194* 190* 127* 93   Cardiac Enzymes: No results for input(s): CKTOTAL, CKMB, CKMBINDEX, TROPONINI in the last 168 hours. No results for input(s): PROBNP in the last 8760 hours. Coagulation Profile: No results for input(s): INR, PROTIME in the last 168 hours. Thyroid Function Tests: No results for input(s): TSH, T4TOTAL, FREET4, T3FREE, THYROIDAB in the last 72 hours. Lipid Profile: No results for input(s): CHOL, HDL, LDLCALC, TRIG, CHOLHDL, LDLDIRECT in the last 72 hours. Anemia Panel: No results for input(s): VITAMINB12, FOLATE, FERRITIN, TIBC, IRON, RETICCTPCT in the last 72 hours. Urine analysis:    Component Value Date/Time   COLORURINE YELLOW 07/06/2019 Agency 07/06/2019 0437   LABSPEC 1.024 07/06/2019 0437   PHURINE 5.0 07/06/2019 0437   GLUCOSEU >=500 (A) 07/06/2019 0437   HGBUR MODERATE (A) 07/06/2019  0437   BILIRUBINUR NEGATIVE 07/06/2019 0437   KETONESUR 80 (A) 07/06/2019 0437   PROTEINUR NEGATIVE 07/06/2019 0437   NITRITE NEGATIVE 07/06/2019 0437   LEUKOCYTESUR NEGATIVE 07/06/2019 0437   Sepsis Labs: Invalid input(s): PROCALCITONIN, [place]  Microbiology: No results found for this or any previous visit (from the past 240 hour(s)).  Radiology Studies: No results found.  Total time spent 26 minutes Ishmael Holter, M.D. [facility]  If 7PM-7AM, please contact night-coverage [email or web address] Password [facility] 07/24/2019, 8:34 AM

## 2019-07-25 LAB — CBC WITH DIFFERENTIAL/PLATELET
Abs Immature Granulocytes: 0.05 10*3/uL (ref 0.00–0.07)
Basophils Absolute: 0.1 10*3/uL (ref 0.0–0.1)
Basophils Relative: 1 %
Eosinophils Absolute: 0.2 10*3/uL (ref 0.0–0.5)
Eosinophils Relative: 2 %
HCT: 29.1 % — ABNORMAL LOW (ref 39.0–52.0)
Hemoglobin: 9.2 g/dL — ABNORMAL LOW (ref 13.0–17.0)
Immature Granulocytes: 1 %
Lymphocytes Relative: 18 %
Lymphs Abs: 1.6 10*3/uL (ref 0.7–4.0)
MCH: 30.2 pg (ref 26.0–34.0)
MCHC: 31.6 g/dL (ref 30.0–36.0)
MCV: 95.4 fL (ref 80.0–100.0)
Monocytes Absolute: 0.5 10*3/uL (ref 0.1–1.0)
Monocytes Relative: 6 %
Neutro Abs: 6.6 10*3/uL (ref 1.7–7.7)
Neutrophils Relative %: 72 %
Platelets: 251 10*3/uL (ref 150–400)
RBC: 3.05 MIL/uL — ABNORMAL LOW (ref 4.22–5.81)
RDW: 14 % (ref 11.5–15.5)
WBC: 8.9 10*3/uL (ref 4.0–10.5)
nRBC: 0 % (ref 0.0–0.2)

## 2019-07-25 LAB — BASIC METABOLIC PANEL
Anion gap: 9 (ref 5–15)
BUN: <5 mg/dL — ABNORMAL LOW (ref 6–20)
CO2: 30 mmol/L (ref 22–32)
Calcium: 8.7 mg/dL — ABNORMAL LOW (ref 8.9–10.3)
Chloride: 94 mmol/L — ABNORMAL LOW (ref 98–111)
Creatinine, Ser: 0.49 mg/dL — ABNORMAL LOW (ref 0.61–1.24)
GFR calc Af Amer: >60 mL/min (ref >60)
GFR calc non Af Amer: >60 mL/min (ref >60)
Glucose, Bld: 144 mg/dL — ABNORMAL HIGH (ref 70–99)
Potassium: 3.8 mmol/L (ref 3.5–5.1)
Sodium: 133 mmol/L — ABNORMAL LOW (ref 135–145)

## 2019-07-25 LAB — GLUCOSE, CAPILLARY
Glucose-Capillary: 138 mg/dL — ABNORMAL HIGH (ref 70–99)
Glucose-Capillary: 107 mg/dL — ABNORMAL HIGH (ref 70–99)
Glucose-Capillary: 112 mg/dL — ABNORMAL HIGH (ref 70–99)
Glucose-Capillary: 162 mg/dL — ABNORMAL HIGH (ref 70–99)

## 2019-07-25 NOTE — Progress Notes (Signed)
PROGRESS NOTE  Bradley Cox [id number] DOB: [birth year]   PCP: Simona Huh, NP  Patient is from: Home  DOA: 07/05/2019 LOS: 32  Brief Narrative / Interim history: 48 year old male with history of obesity, asthma, hypertension, GERD, nephrolithiasis, diabetes mellitus who presented to ED on 07/06/2019 with low back pain radiating to the right gluteus/thigh with progressive decline in the right lower extremity strength, difficulty ambulation over 2 to 3 weeks.  On presentation he was found to be febrile, tachycardic, tachypneic.  Blood glucose was elevated with increased anion gap.  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  Patient was started on broad-spectrum antibiotics, insulin for DKA.  Neurosurgery, ID consulted.  Patient underwent IR guided his paraspinal abscess drainage.  Blood cultures now growing MSSA. Dr. Doreatha Martin removed 500 mL from a large right-sided gluteal abscess, then performed a neuroplasty of the right sciatic nerve on 07/09/2019. Currently on cefazolin.  Patient underwent TTE on 07/07/2019 followed by TEE on 07/11/2019 which showed no vegetations and normal ejection fraction and no thrombus.  Patient had episode of nausea and vomiting the night of 9/9.  Vomiting was nonbilious.  KUB obtained and revealed marked gaseous dilation of stomach and nonobstructive gas pattern and small and large bowel concerning for ileus. NG tube was placed for decompression.  However, NG tube came out the next morning.  Patient has not had further emesis.  Passing gas but has not has bowel movement.  Repeat abdominal x-ray on 07/15/2019 shows resolved ileus.  On 07/16/2019 is right hip/buttock wound started oozing significantly. Dr. Doreatha Martin and orthopedic team was contacted.  Initially his wound was thought to be nonpurulent.  He was evaluated again on 07/17/2019 and it became purulent and Ortho took him to  the OR and did another I&D of right gluteal abscess with drainage of 300 cc purulent fluid.  Repeat irrigation and debridement right hip and application of wound VAC on 07/20/19  Assessment & Plan: Sepsis/MSSA bacteremia/L3-S1 epidural abscess/right iliopsoas and gluteal abscess/right lower extremity cellulitis -Lower extremity Doppler negative for DVT. -Currently on cefazolin per ID.  Plan is 6 to 8 weeks of IV Ancef.  -Blood and tissue cultures on 9/4-growing MSSA. Repeat blood culture on 9/7 negative so far. -TTE and TEE did not show any vegetation or any thrombus.  . -PICC line 9/12.-PT/OT evaluated and they recommended CIR Status post another I&D of the right gluteal abscess with drainage of 300 cc purulent fluid on 07/17/2019.  Repeat irrigation and debridement right hip and application of wound VAC on 07/20/2019.  Doing better.  Pain controlled.  Ileus/constipation/possible gastric outlet obstruction versus possible gastroparesis Resolved.  Has been tolerating diet for very long time.  Uncontrolled NIDDM-2/DKA: DKA resolved. Hemoglobin A1c of 12.6.  Blood sugar control.  Continue current dose of pre-meal regimen, SSI and twice daily insulin NPH.  Hyponatremia: Mild and stable.  Check daily labs.  AKI:  Resolved   Hypokalemia:  Resolved.  Hypertension:   Normotensive. -Continue hydralazine scheduled and as needed.  GERD: Continue PPI  Fatty liver:  Check lipid profile.   May benefit from statins.    Morbid obesity:  BMI 42.6 Diet and exercise Further management on outpatient basis  DVT prophylaxis: Subcu Lovenox Code Status: Full code Family Communication: None present at bedside.  Plan of care discussed with patient. Disposition Plan: Remains inpatient pending clearance by orthopedics.  Does not meet criteria for CIR now.  Does not have insurance so SNF might be difficult option.  Considering home health care. Consultants: Ortho trauma, infectious disease    Subjective: Patient seen and examined.  Complains of some pain in the right hip.  No new complaint.  As per his mother is going to visit him today and then they are willing to talk about where he wants to go from here.  Objective: Vitals:   07/25/19 0028 07/25/19 0437 07/25/19 0735 07/25/19 1233  BP: 130/71 119/69 123/71 115/70  Pulse: (!) 103 93 93 98  Resp:   17 (!) 25  Temp: 99.6 F (37.6 C) 98.3 F (36.8 C) 98.1 F (36.7 C) 99 F (37.2 C)  TempSrc: Oral Oral Oral Oral  SpO2: 96% 95% 93% 94%  Weight:      Height:        Intake/Output Summary (Last 24 hours) at 07/25/2019 1535 Last data filed at 07/25/2019 0800 Gross per 24 hour  Intake 3 ml  Output 4100 ml  Net -4097 ml   Filed Weights   07/05/19 1807 07/06/19 2116  Weight: (!) 167.4 kg (!) 164 kg    Examination:  General exam: Appears calm and comfortable, morbidly obese  respiratory system: Clear to auscultation. Respiratory effort normal. Cardiovascular system: S1 & S2 heard, RRR. No JVD, murmurs, rubs, gallops or clicks. No pedal edema. Gastrointestinal system: Abdomen is nondistended, soft and nontender. No organomegaly or masses felt. Normal bowel sounds heard. Central nervous system: Alert and oriented. No focal neurological deficits. Extremities: Symmetric 5 x 5 power in all extremities except right lower extremity where he has 4 1/5 power. Skin: No rashes, lesions or ulcers.  Psychiatry: Judgement and insight appear normal. Mood & affect appropriate.    Procedures:  9/6-incision and drainage of paraspinal abscess 07/09/2019 neuroplasty of the right sciatic nerve by neurosurgery 9/9-TEE Incision and drainage of right gluteal abscess on 07/17/2019 Irrigation and debridement of right hip on 07/20/2019   Microbiology summarized: 9/4-SARS-CoV-2 screen negative 9/4-blood and tissue cultures-MSSA 9/7-blood cultures negative.  Antimicrobials: Anti-infectives (From admission, onward)   Start     Dose/Rate  Route Frequency Ordered Stop   07/17/19 1857  gentamicin (GARAMYCIN) injection  Status:  Discontinued       As needed 07/17/19 1857 07/17/19 2009   07/17/19 1857  vancomycin (VANCOCIN) powder  Status:  Discontinued       As needed 07/17/19 1858 07/17/19 2009   07/10/19 1600  metroNIDAZOLE (FLAGYL) IVPB 500 mg  Status:  Discontinued     500 mg 100 mL/hr over 60 Minutes Intravenous Every 8 hours 07/10/19 1549 07/13/19 0939   07/09/19 0839  vancomycin (VANCOCIN) powder  Status:  Discontinued       As needed 07/09/19 0839 07/09/19 0935   07/09/19 0839  tobramycin (NEBCIN) powder  Status:  Discontinued       As needed 07/09/19 0840 07/09/19 0935   07/07/19 1830  ceFAZolin (ANCEF) IVPB 2g/100 mL premix     2 g 200 mL/hr over 30 Minutes Intravenous Every 8 hours 07/07/19 1333     07/07/19 0900  ceFAZolin (ANCEF) IVPB 2g/100 mL premix  Status:  Discontinued     2 g 200 mL/hr over 30 Minutes Intravenous Every 8 hours 07/07/19 0847 07/07/19 1333   07/06/19 2030  clindamycin (CLEOCIN) IVPB 600 mg  Status:  Discontinued     600 mg 100 mL/hr over 30 Minutes Intravenous Every 8 hours 07/06/19 2010 07/07/19 0847   07/06/19 1800  vancomycin (VANCOCIN) 1,500  mg in sodium chloride 0.9 % 500 mL IVPB  Status:  Discontinued     1,500 mg 250 mL/hr over 120 Minutes Intravenous Every 12 hours 07/06/19 0820 07/07/19 0847   07/06/19 1400  ceFEPIme (MAXIPIME) 2 g in sodium chloride 0.9 % 100 mL IVPB  Status:  Discontinued     2 g 200 mL/hr over 30 Minutes Intravenous Every 8 hours 07/06/19 0504 07/07/19 0847   07/06/19 1400  vancomycin (VANCOCIN) 2,000 mg in sodium chloride 0.9 % 500 mL IVPB  Status:  Discontinued     2,000 mg 250 mL/hr over 120 Minutes Intravenous Every 8 hours 07/06/19 0504 07/06/19 0818   07/06/19 0745  clindamycin (CLEOCIN) IVPB 600 mg  Status:  Discontinued     600 mg 100 mL/hr over 30 Minutes Intravenous  Once 07/06/19 0737 07/06/19 2029   07/06/19 0500  ceFEPIme (MAXIPIME) 2 g in sodium  chloride 0.9 % 100 mL IVPB     2 g 200 mL/hr over 30 Minutes Intravenous  Once 07/06/19 0451 07/06/19 1335   07/06/19 0500  vancomycin (VANCOCIN) 2,500 mg in sodium chloride 0.9 % 500 mL IVPB     2,500 mg 250 mL/hr over 120 Minutes Intravenous  Once 07/06/19 0451 07/06/19 1335      Sch Meds:  Scheduled Meds: . acetaminophen  500 mg Oral Q12H   Or  . acetaminophen  650 mg Rectal Q12H  . Chlorhexidine Gluconate Cloth  6 each Topical Daily  . enoxaparin (LOVENOX) injection  0.5 mg/kg Subcutaneous Q24H  . gabapentin  300 mg Oral TID  . hydrALAZINE  25 mg Oral BID  . influenza vac split quadrivalent PF  0.5 mL Intramuscular Tomorrow-1000  . insulin aspart  0-15 Units Subcutaneous TID WC  . insulin aspart  0-5 Units Subcutaneous QHS  . insulin aspart  8 Units Subcutaneous TID WC  . insulin NPH Human  28 Units Subcutaneous BID AC & HS  . methocarbamol  750 mg Oral TID  . metoCLOPramide  10 mg Oral TID AC & HS  . polyethylene glycol  17 g Oral BID  . senna-docusate  1 tablet Oral BID  . sodium chloride flush  10-40 mL Intracatheter Q12H  . sodium chloride flush  3 mL Intravenous Q12H   Continuous Infusions: .  ceFAZolin (ANCEF) IV 2 g (07/25/19 0946)  . lactated ringers     PRN Meds:.albuterol, fentaNYL (SUBLIMAZE) injection, hydrALAZINE, HYDROcodone-acetaminophen, ondansetron **OR** ondansetron (ZOFRAN) IV, sodium chloride flush, zolpidem   I have personally reviewed the following labs and images: CBC: Recent Labs  Lab 07/21/19 0600 07/22/19 0256 07/23/19 0500 07/24/19 0325 07/25/19 0500  WBC 9.2 9.6 9.4 10.0 8.9  NEUTROABS 7.7 7.5 7.3 7.6 6.6  HGB 9.2* 9.0* 9.3* 9.6* 9.2*  HCT 28.4* 28.7* 30.3* 30.5* 29.1*  MCV 93.7 94.7 97.1 95.9 95.4  PLT 281 285 277 276 251   BMP &GFR Recent Labs  Lab 07/19/19 0535 07/20/19 1353 07/23/19 0800 07/24/19 0325 07/25/19 0500  NA 131* 130* 135 134* 133*  K 3.8 4.3 4.1 3.8 3.8  CL 91* 94* 94* 93* 94*  CO2 29 29 32 31 30  GLUCOSE  160* 357* 187* 102* 144*  BUN <5* <5* <5* 5* <5*  CREATININE 0.55* 0.51* 0.49* 0.52* 0.49*  CALCIUM 8.2* 8.1* 8.8* 8.5* 8.7*   Estimated Creatinine Clearance: 188.1 mL/min (A) (by C-G formula based on SCr of 0.49 mg/dL (L)). Liver & Pancreas: Recent Labs  Lab 07/19/19 0535 07/20/19 1353  AST  17 20  ALT 14 16  ALKPHOS 60 70  BILITOT 0.2* 0.3  PROT 6.1* 6.5  ALBUMIN 1.4* 1.6*   No results for input(s): LIPASE, AMYLASE in the last 168 hours. No results for input(s): AMMONIA in the last 168 hours. Diabetic: No results for input(s): HGBA1C in the last 72 hours. Recent Labs  Lab 07/24/19 1126 07/24/19 1543 07/24/19 2139 07/25/19 0731 07/25/19 1230  GLUCAP 102* 146* 167* 138* 107*   Cardiac Enzymes: No results for input(s): CKTOTAL, CKMB, CKMBINDEX, TROPONINI in the last 168 hours. No results for input(s): PROBNP in the last 8760 hours. Coagulation Profile: No results for input(s): INR, PROTIME in the last 168 hours. Thyroid Function Tests: No results for input(s): TSH, T4TOTAL, FREET4, T3FREE, THYROIDAB in the last 72 hours. Lipid Profile: No results for input(s): CHOL, HDL, LDLCALC, TRIG, CHOLHDL, LDLDIRECT in the last 72 hours. Anemia Panel: No results for input(s): VITAMINB12, FOLATE, FERRITIN, TIBC, IRON, RETICCTPCT in the last 72 hours. Urine analysis:    Component Value Date/Time   COLORURINE YELLOW 07/06/2019 [place] 07/06/2019 0437   LABSPEC 1.024 07/06/2019 0437   PHURINE 5.0 07/06/2019 0437   GLUCOSEU >=500 (A) 07/06/2019 0437   HGBUR MODERATE (A) 07/06/2019 0437   BILIRUBINUR NEGATIVE 07/06/2019 0437   KETONESUR 80 (A) 07/06/2019 0437   PROTEINUR NEGATIVE 07/06/2019 0437   NITRITE NEGATIVE 07/06/2019 0437   LEUKOCYTESUR NEGATIVE 07/06/2019 0437   Sepsis Labs: Invalid input(s): PROCALCITONIN, [place]  Microbiology: No results found for this or any previous visit (from the past 240 hour(s)).  Radiology Studies: No results found.   Total time spent 25 minutes Ishmael Holter, M.D. [facility]  If 7PM-7AM, please contact night-coverage [email or web address] Password [facility] 07/25/2019, 3:35 PM

## 2019-07-25 NOTE — Progress Notes (Signed)
Occupational Therapy Treatment Patient Details Name: Bradley Cox MRN: [id number] DOB: 1971-08-20 Today's Date: 07/25/2019    History of present illness Patient is a 48 y/o male who presents with right flank pain. Admitted with disseminated MSSA infection including bacteremia, Lumbosacral spinal epidural abscess, paraspinal/large gluteal/pelvic abscess with sciatica impingement. s/p I&D large gluteal abscess and neuroplasty of sciatica nerve 9/7. s/p CT aspiration right paraspinal abscess. PMH includes DM, HTN, morbid obesity, OSA.   OT comments  Pt making steady progress towards OT goals this session. Session focus on functional mobility as precursor to higher level ADLs. Pt complete bed mobility MOD I with use of bed features. Pt sit>stand from elevated EOB with RW. Supervision +2 assist for safety and equipment management. Pt complete functional mobility from EOB> door  wotj MIN A +2 for safety and equipment management. Once at door way,  pt beginning to perspire and starting to sway. Pt able to sit in recliner quickly to avoid syncope episode. BP checked and legs elevated (135/107 152/99). DC plan for 24 hour assistance remains appropriate. Will continue to follow for acute OT needs.    Follow Up Recommendations  CIR;Supervision/Assistance - 24 hour    Equipment Recommendations  Other (comment)(tbd to next venue)    Recommendations for Other Services      Precautions / Restrictions Precautions Precautions: Fall Precaution Comments: wound vac R hip Restrictions Weight Bearing Restrictions: Yes RLE Weight Bearing: Weight bearing as tolerated       Mobility Bed Mobility Overal bed mobility: Modified Independent             General bed mobility comments: HOB elevated; use of bed feature but not physical assist  Transfers Overall transfer level: Needs assistance Equipment used: Rolling walker (2 wheeled) Transfers: Sit to/from Stand Sit to Stand: From elevated  surface;Supervision;+2 safety/equipment         General transfer comment: supervision for safety when initally standing; +2 for safety with equipment during transfer and for near syncope episode during functional mobility    Balance Overall balance assessment: Needs assistance Sitting-balance support: Feet supported;No upper extremity supported Sitting balance-Leahy Scale: Fair     Standing balance support: Bilateral upper extremity supported;During functional activity Standing balance-Leahy Scale: Poor Standing balance comment: reliant on BUE during funcitonal mobility                           ADL either performed or assessed with clinical judgement   ADL Overall ADL's : Needs assistance/impaired                     Lower Body Dressing: Total assistance Lower Body Dressing Details (indicate cue type and reason): total A to adjust socks in standing Toilet Transfer: +2 for physical assistance;RW;+2 for safety/equipment;Minimal assistance;Ambulation Toilet Transfer Details (indicate cue type and reason): Pt performing functional mobility to door with RW and MIN A; pt began to perspire and sway sat down and checked BP 135/107; 152/99         Functional mobility during ADLs: +2 for safety/equipment;Minimal assistance;Rolling walker;+2 for physical assistance General ADL Comments: Pt continues to present with decreased functional performance and is limited by pain; denied dizziness but began to sweat during functional mobility needing to sit down; presented like he was about to have a synope episode but did not actually passout ; Benefits from increased cues for encouragement.     Vision       Perception  Praxis      Cognition Arousal/Alertness: Awake/alert Behavior During Therapy: WFL for tasks assessed/performed Overall Cognitive Status: Within Functional Limits for tasks assessed                                 General Comments:  Distracted by pain and requiring increased time        Exercises     Shoulder Instructions       General Comments      Pertinent Vitals/ Pain       Pain Assessment: Faces Faces Pain Scale: Hurts whole lot Pain Location: RLE; especially when lifted or touched Pain Descriptors / Indicators: Grimacing;Guarding;Sore;Discomfort;Pressure Pain Intervention(s): Limited activity within patient's tolerance;Monitored during session;Repositioned  Home Living                                          Prior Functioning/Environment              Frequency  Min 3X/week        Progress Toward Goals  OT Goals(current goals can now be found in the care plan section)  Progress towards OT goals: Progressing toward goals  Acute Rehab OT Goals Patient Stated Goal: to get back home OT Goal Formulation: With patient Time For Goal Achievement: 08/07/19(OTR updated) Potential to Achieve Goals: Good ADL Goals Pt Will Transfer to Toilet: with min guard assist;ambulating;bedside commode  Plan Discharge plan remains appropriate    Co-evaluation    PT/OT/SLP Co-Evaluation/Treatment: Yes Reason for Co-Treatment: For patient/therapist safety;To address functional/ADL transfers   OT goals addressed during session: ADL's and self-care      AM-PAC OT "6 Clicks" Daily Activity     Outcome Measure   Help from another person eating meals?: None Help from another person taking care of personal grooming?: A Little Help from another person toileting, which includes using toliet, bedpan, or urinal?: A Lot Help from another person bathing (including washing, rinsing, drying)?: A Lot Help from another person to put on and taking off regular upper body clothing?: A Lot Help from another person to put on and taking off regular lower body clothing?: Total 6 Click Score: 14    End of Session Equipment Utilized During Treatment: Rolling walker;Other (comment);Gait  belt(bariatric)  OT Visit Diagnosis: Unsteadiness on feet (R26.81);Other abnormalities of gait and mobility (R26.89);Muscle weakness (generalized) (M62.81);Other symptoms and signs involving cognitive function;Pain   Activity Tolerance Patient limited by pain;Other (comment)(near syncope episode)   Patient Left in chair;with call bell/phone within reach   Nurse Communication          Time: [id number] OT Time Calculation (min): 27 min  Charges: OT General Charges $OT Visit: 1 Visit OT Treatments $Therapeutic Activity: 8-22 mins  Frazer, Malcolm [place] 07/25/2019, 4:20 PM

## 2019-07-25 NOTE — Progress Notes (Signed)
Physical Therapy Treatment Patient Details Name: Bradley Cox MRN: [id number] DOB: 01-Oct-1971 Today's Date: 07/25/2019    History of Present Illness Patient is a 48 y/o male who presents with right flank pain. Admitted with disseminated MSSA infection including bacteremia, Lumbosacral spinal epidural abscess, paraspinal/large gluteal/pelvic abscess with sciatica impingement. s/p I&D large gluteal abscess and neuroplasty of sciatica nerve 9/7. s/p CT aspiration right paraspinal abscess. PMH includes DM, HTN, morbid obesity, OSA.    PT Comments    Patient progressing to ambulation to doorway and started back, but was sweaty and possibly pre-syncopal, though BP not low.  Felt he could have gone further, but also HR up in 120's.  Still with a lot of pain and partially due to edema in the leg.  Initiated some lymphatic drainage techniques, but feel if he could tolerate wrapping might help.  CIR felt pt not progressing well enough to go, but difficult to place and no PT in SNF.  Wonder if he plans to stay with his dad if he could go home if medical issues are resolved enough and do PT at home (charity).  Will continue to follow acutely.  Goals/POC updated this session.   Follow Up Recommendations  CIR     Equipment Recommendations  Rolling walker with 5" wheels;3in1 (PT)    Recommendations for Other Services       Precautions / Restrictions Precautions Precautions: Fall Precaution Comments: wound vac R hip Restrictions Weight Bearing Restrictions: Yes RLE Weight Bearing: Weight bearing as tolerated    Mobility  Bed Mobility Overal bed mobility: Needs Assistance       Supine to sit: Supervision;HOB elevated     General bed mobility comments: increased time, assist for safety and to manage lines  Transfers Overall transfer level: Needs assistance Equipment used: Rolling walker (2 wheeled) Transfers: Sit to/from Stand Sit to Stand: From elevated surface;Supervision;+2  safety/equipment         General transfer comment: supervision for safety when initally standing; +2 for safety with equipment during transfer and for near syncope episode during functional mobility  Ambulation/Gait Ambulation/Gait assistance: +2 safety/equipment;Min assist Gait Distance (Feet): 22 Feet Assistive device: Rolling walker (2 wheeled) Gait Pattern/deviations: Step-to pattern;Decreased weight shift to right;Antalgic     General Gait Details: antalgic and sometimes rushing despite need for keeping up with lines.  Demonstrated some pallor and diaphoresis so requested he stop and sit and BP taken, but was high, encouraged snacks as pt reports not eating a lot. HR up to 120's   Stairs             Wheelchair Mobility    Modified Rankin (Stroke Patients Only)       Balance Overall balance assessment: Needs assistance Sitting-balance support: Feet supported;No upper extremity supported Sitting balance-Leahy Scale: Fair     Standing balance support: Bilateral upper extremity supported;During functional activity Standing balance-Leahy Scale: Poor Standing balance comment: reliant on BUE during funcitonal mobility                            Cognition Arousal/Alertness: Awake/alert Behavior During Therapy: WFL for tasks assessed/performed Overall Cognitive Status: Within Functional Limits for tasks assessed                                 General Comments: Distracted by pain and requiring increased time      Exercises Other  Exercises Other Exercises: performed manual lymph drainage on L LE starting with activating lymphatics at groin    General Comments        Pertinent Vitals/Pain Pain Assessment: Faces Faces Pain Scale: Hurts whole lot Pain Location: RLE; especially when lifted or touched Pain Descriptors / Indicators: Grimacing;Guarding;Sore;Discomfort;Pressure Pain Intervention(s): Monitored during  session;Repositioned;Limited activity within patient's tolerance    Home Living                      Prior Function            PT Goals (current goals can now be found in the care plan section) Acute Rehab PT Goals Patient Stated Goal: to get back home PT Goal Formulation: With patient Time For Goal Achievement: 08/08/19 Potential to Achieve Goals: Good Progress towards PT goals: Progressing toward goals;Goals met and updated - see care plan    Frequency    Min 3X/week      PT Plan      Co-evaluation PT/OT/SLP Co-Evaluation/Treatment: Yes Reason for Co-Treatment: For patient/therapist safety;To address functional/ADL transfers PT goals addressed during session: Mobility/safety with mobility;Strengthening/ROM;Proper use of DME OT goals addressed during session: ADL's and self-care      AM-PAC PT "6 Clicks" Mobility   Outcome Measure  Help needed turning from your back to your side while in a flat bed without using bedrails?: None Help needed moving from lying on your back to sitting on the side of a flat bed without using bedrails?: None Help needed moving to and from a bed to a chair (including a wheelchair)?: A Little Help needed standing up from a chair using your arms (e.g., wheelchair or bedside chair)?: A Little Help needed to walk in hospital room?: A Lot Help needed climbing 3-5 steps with a railing? : Total 6 Click Score: 17    End of Session Equipment Utilized During Treatment: Gait belt Activity Tolerance: Patient limited by fatigue;Treatment limited secondary to medical complications (Comment)(possibly pre-syncopal) Patient left: in chair;with call bell/phone within reach   PT Visit Diagnosis: Other abnormalities of gait and mobility (R26.89);Muscle weakness (generalized) (M62.81);Pain;Difficulty in walking, not elsewhere classified (R26.2) Pain - Right/Left: Right Pain - part of body: Hip     Time: 1540-1606 PT Time Calculation (min)  (ACUTE ONLY): 26 min  Charges:  $Gait Training: 8-22 mins                     Magda Kiel, [place] [phone] 07/25/2019    Reginia Naas 07/25/2019, 5:48 PM

## 2019-07-26 ENCOUNTER — Inpatient Hospital Stay (HOSPITAL_COMMUNITY): Payer: Self-pay

## 2019-07-26 DIAGNOSIS — L02415 Cutaneous abscess of right lower limb: Secondary | ICD-10-CM

## 2019-07-26 LAB — CBC WITH DIFFERENTIAL/PLATELET
Abs Immature Granulocytes: 0.07 10*3/uL (ref 0.00–0.07)
Basophils Absolute: 0.1 10*3/uL (ref 0.0–0.1)
Basophils Relative: 1 %
Eosinophils Absolute: 0.2 10*3/uL (ref 0.0–0.5)
Eosinophils Relative: 2 %
HCT: 30.2 % — ABNORMAL LOW (ref 39.0–52.0)
Hemoglobin: 9.3 g/dL — ABNORMAL LOW (ref 13.0–17.0)
Immature Granulocytes: 1 %
Lymphocytes Relative: 16 %
Lymphs Abs: 1.6 10*3/uL (ref 0.7–4.0)
MCH: 29.7 pg (ref 26.0–34.0)
MCHC: 30.8 g/dL (ref 30.0–36.0)
MCV: 96.5 fL (ref 80.0–100.0)
Monocytes Absolute: 0.6 10*3/uL (ref 0.1–1.0)
Monocytes Relative: 6 %
Neutro Abs: 8 10*3/uL — ABNORMAL HIGH (ref 1.7–7.7)
Neutrophils Relative %: 74 %
Platelets: 267 10*3/uL (ref 150–400)
RBC: 3.13 MIL/uL — ABNORMAL LOW (ref 4.22–5.81)
RDW: 14.1 % (ref 11.5–15.5)
WBC: 10.6 10*3/uL — ABNORMAL HIGH (ref 4.0–10.5)
nRBC: 0 % (ref 0.0–0.2)

## 2019-07-26 LAB — GLUCOSE, CAPILLARY
Glucose-Capillary: 176 mg/dL — ABNORMAL HIGH (ref 70–99)
Glucose-Capillary: 129 mg/dL — ABNORMAL HIGH (ref 70–99)
Glucose-Capillary: 129 mg/dL — ABNORMAL HIGH (ref 70–99)
Glucose-Capillary: 132 mg/dL — ABNORMAL HIGH (ref 70–99)

## 2019-07-26 MED ORDER — GADOBUTROL 1 MMOL/ML IV SOLN
10.0000 mL | Freq: Once | INTRAVENOUS | Status: AC | PRN
  Administered 2019-07-26: 10 mL via INTRAVENOUS

## 2019-07-26 MED ORDER — FUROSEMIDE 10 MG/ML IJ SOLN
40.0000 mg | Freq: Every day | INTRAMUSCULAR | Status: AC
  Administered 2019-07-26 – 2019-07-28 (×3): 40 mg via INTRAVENOUS
  Filled 2019-07-26 (×3): qty 4

## 2019-07-26 NOTE — Progress Notes (Signed)
PROGRESS NOTE  Bradley Cox [id number] DOB: 01/24/1971   PCP: Simona Huh, NP  Patient is from: Home  DOA: 07/05/2019 LOS: 14  Brief Narrative / Interim history: 48 year old male with history of obesity, asthma, hypertension, GERD, nephrolithiasis, diabetes mellitus who presented to ED on 07/06/2019 with low back pain radiating to the right gluteus/thigh with progressive decline in the right lower extremity strength, difficulty ambulation over 2 to 3 weeks.  On presentation he was found to be febrile, tachycardic, tachypneic.  Blood glucose was elevated with increased anion gap.  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  Patient was started on broad-spectrum antibiotics, insulin for DKA.  Neurosurgery, ID consulted.  Patient underwent IR guided his paraspinal abscess drainage.  Blood cultures now growing MSSA. Dr. Doreatha Martin removed 500 mL from a large right-sided gluteal abscess, then performed a neuroplasty of the right sciatic nerve on 07/09/2019. Currently on cefazolin.  Patient underwent TTE on 07/07/2019 followed by TEE on 07/11/2019 which showed no vegetations and normal ejection fraction and no thrombus.  Patient had episode of nausea and vomiting the night of 9/9.  Vomiting was nonbilious.  KUB obtained and revealed marked gaseous dilation of stomach and nonobstructive gas pattern and small and large bowel concerning for ileus. NG tube was placed for decompression.  However, NG tube came out the next morning.  Patient has not had further emesis.  Passing gas but has not has bowel movement.  Repeat abdominal x-ray on 07/15/2019 shows resolved ileus.  On 07/16/2019 is right hip/buttock wound started oozing significantly. Dr. Doreatha Martin and orthopedic team was contacted.  Initially his wound was thought to be nonpurulent.  He was evaluated again on 07/17/2019 and it became purulent and Ortho took him to  the OR and did another I&D of right gluteal abscess with drainage of 300 cc purulent fluid.  Repeat irrigation and debridement right hip and application of wound VAC on 07/20/19.  Postoperatively, PT OT assessed him and they recommended CIR however CIR thought that he was not appropriate so that was denied.  He does not have any insurance to go for SNF.  Only option left for him is to go to home with home health but he does not feel he is ready to go home since he is not able to walk without help yet.  Assessment & Plan: Sepsis/MSSA bacteremia/L3-S1 epidural abscess/right iliopsoas and gluteal abscess/right lower extremity cellulitis -Lower extremity Doppler negative for DVT. -Currently on cefazolin per ID.  Plan is 6 to 8 weeks of IV Ancef.  -Blood and tissue cultures on 9/4-growing MSSA. Repeat blood culture on 9/7 negative so far. -TTE and TEE did not show any vegetation or any thrombus.  . -PICC line 9/12.-PT/OT evaluated and they recommended CIR Status post another I&D of the right gluteal abscess with drainage of 300 cc purulent fluid on 07/17/2019.  Repeat irrigation and debridement right hip and application of wound VAC on 07/20/2019.  Doing better.  Pain controlled.  Ileus/constipation/possible gastric outlet obstruction versus possible gastroparesis Resolved.  Has been tolerating diet for very long time.  Uncontrolled NIDDM-2/DKA: DKA resolved. Hemoglobin A1c of 12.6.  Blood sugar control.  Continue current dose of pre-meal regimen, SSI and twice daily insulin NPH.  Hyponatremia: Mild and stable.  Check daily labs.  AKI:  Resolved   Hypokalemia:  Resolved.  Hypertension:   Normotensive. -Continue hydralazine scheduled and as needed.  GERD: Continue  PPI  Fatty liver:  Check lipid profile.   May benefit from statins.    Morbid obesity:  BMI 42.6 Diet and exercise Further management on outpatient basis  Bilateral lower extremity edema: No known history of CHF.   Likely dependent since he is not able to walk much.  We will give IV Lasix 40 mg daily starting today for total of 3 days.   DVT prophylaxis: Subcu Lovenox Code Status: Full code Family Communication: None present at bedside.  Plan of care discussed with patient. Disposition Plan: Remains inpatient pending clearance by orthopedics.  Does not meet criteria for CIR now.  Does not have insurance so SNF might be difficult option.  Considering home health care. Consultants: Ortho trauma, infectious disease   Subjective: Patient seen and examined.  Complains of right hip pain.  Fairly controlled with pain medications.  He states that he will eventually go and live with his father since he does not have any other option however he does not think he is ready yet.  He would like to walk little more than he is doing now before he goes home.  He anticipates next week.  Objective: Vitals:   07/25/19 2042 07/25/19 2331 07/26/19 0345 07/26/19 0734  BP: 122/71 110/67 120/76 115/72  Pulse: (!) 114 (!) 103 (!) 111 97  Resp:    13  Temp: 99.3 F (37.4 C) 98 F (36.7 C) 98.6 F (37 C) 99 F (37.2 C)  TempSrc: Oral Oral Oral Oral  SpO2:  97% 97% 97%  Weight:      Height:        Intake/Output Summary (Last 24 hours) at 07/26/2019 0758 Last data filed at 07/25/2019 1621 Gross per 24 hour  Intake -  Output 1500 ml  Net -1500 ml   Filed Weights   07/05/19 1807 07/06/19 2116  Weight: (!) 167.4 kg (!) 164 kg    Examination:  General exam: Appears calm and comfortable, morbidly obese Respiratory system: Clear to auscultation. Respiratory effort normal. Cardiovascular system: S1 & S2 heard, RRR. No JVD, murmurs, rubs, gallops or clicks.  +3 pitting edema right lower extremity and +2 pitting edema left lower extremity Gastrointestinal system: Abdomen is nondistended, soft and nontender. No organomegaly or masses felt. Normal bowel sounds heard. Central nervous system: Alert and oriented. 4 1/2 power  in right lower extremity. Skin: No rashes, lesions or ulcers.  Psychiatry: Judgement and insight appear normal. Mood & affect appropriate.   Procedures:  9/6-incision and drainage of paraspinal abscess 07/09/2019 neuroplasty of the right sciatic nerve by neurosurgery 9/9-TEE Incision and drainage of right gluteal abscess on 07/17/2019 Irrigation and debridement of right hip on 07/20/2019   Microbiology summarized: 9/4-SARS-CoV-2 screen negative 9/4-blood and tissue cultures-MSSA 9/7-blood cultures negative.  Antimicrobials: Anti-infectives (From admission, onward)   Start     Dose/Rate Route Frequency Ordered Stop   07/17/19 1857  gentamicin (GARAMYCIN) injection  Status:  Discontinued       As needed 07/17/19 1857 07/17/19 2009   07/17/19 1857  vancomycin (VANCOCIN) powder  Status:  Discontinued       As needed 07/17/19 1858 07/17/19 2009   07/10/19 1600  metroNIDAZOLE (FLAGYL) IVPB 500 mg  Status:  Discontinued     500 mg 100 mL/hr over 60 Minutes Intravenous Every 8 hours 07/10/19 1549 07/13/19 0939   07/09/19 0839  vancomycin (VANCOCIN) powder  Status:  Discontinued       As needed 07/09/19 [id number] 07/09/19 0935   07/09/19 [id number]  tobramycin (NEBCIN) powder  Status:  Discontinued       As needed 07/09/19 0840 07/09/19 0935   07/07/19 1830  ceFAZolin (ANCEF) IVPB 2g/100 mL premix     2 g 200 mL/hr over 30 Minutes Intravenous Every 8 hours 07/07/19 1333     07/07/19 0900  ceFAZolin (ANCEF) IVPB 2g/100 mL premix  Status:  Discontinued     2 g 200 mL/hr over 30 Minutes Intravenous Every 8 hours 07/07/19 0847 07/07/19 1333   07/06/19 2030  clindamycin (CLEOCIN) IVPB 600 mg  Status:  Discontinued     600 mg 100 mL/hr over 30 Minutes Intravenous Every 8 hours 07/06/19 2010 07/07/19 0847   07/06/19 1800  vancomycin (VANCOCIN) 1,500 mg in sodium chloride 0.9 % 500 mL IVPB  Status:  Discontinued     1,500 mg 250 mL/hr over 120 Minutes Intravenous Every 12 hours 07/06/19 0820 07/07/19 0847    07/06/19 1400  ceFEPIme (MAXIPIME) 2 g in sodium chloride 0.9 % 100 mL IVPB  Status:  Discontinued     2 g 200 mL/hr over 30 Minutes Intravenous Every 8 hours 07/06/19 0504 07/07/19 0847   07/06/19 1400  vancomycin (VANCOCIN) 2,000 mg in sodium chloride 0.9 % 500 mL IVPB  Status:  Discontinued     2,000 mg 250 mL/hr over 120 Minutes Intravenous Every 8 hours 07/06/19 0504 07/06/19 0818   07/06/19 0745  clindamycin (CLEOCIN) IVPB 600 mg  Status:  Discontinued     600 mg 100 mL/hr over 30 Minutes Intravenous  Once 07/06/19 0737 07/06/19 2029   07/06/19 0500  ceFEPIme (MAXIPIME) 2 g in sodium chloride 0.9 % 100 mL IVPB     2 g 200 mL/hr over 30 Minutes Intravenous  Once 07/06/19 0451 07/06/19 1335   07/06/19 0500  vancomycin (VANCOCIN) 2,500 mg in sodium chloride 0.9 % 500 mL IVPB     2,500 mg 250 mL/hr over 120 Minutes Intravenous  Once 07/06/19 0451 07/06/19 1335      Sch Meds:  Scheduled Meds: . acetaminophen  500 mg Oral Q12H   Or  . acetaminophen  650 mg Rectal Q12H  . Chlorhexidine Gluconate Cloth  6 each Topical Daily  . enoxaparin (LOVENOX) injection  0.5 mg/kg Subcutaneous Q24H  . gabapentin  300 mg Oral TID  . hydrALAZINE  25 mg Oral BID  . influenza vac split quadrivalent PF  0.5 mL Intramuscular Tomorrow-1000  . insulin aspart  0-15 Units Subcutaneous TID WC  . insulin aspart  0-5 Units Subcutaneous QHS  . insulin aspart  8 Units Subcutaneous TID WC  . insulin NPH Human  28 Units Subcutaneous BID AC & HS  . methocarbamol  750 mg Oral TID  . metoCLOPramide  10 mg Oral TID AC & HS  . polyethylene glycol  17 g Oral BID  . senna-docusate  1 tablet Oral BID  . sodium chloride flush  10-40 mL Intracatheter Q12H  . sodium chloride flush  3 mL Intravenous Q12H   Continuous Infusions: .  ceFAZolin (ANCEF) IV 2 g (07/26/19 0133)  . lactated ringers     PRN Meds:.albuterol, fentaNYL (SUBLIMAZE) injection, hydrALAZINE, HYDROcodone-acetaminophen, ondansetron **OR**  ondansetron (ZOFRAN) IV, sodium chloride flush, zolpidem   I have personally reviewed the following labs and images: CBC: Recent Labs  Lab 07/22/19 0256 07/23/19 0500 07/24/19 0325 07/25/19 0500 07/26/19 0410  WBC 9.6 9.4 10.0 8.9 10.6*  NEUTROABS 7.5 7.3 7.6 6.6 8.0*  HGB 9.0* 9.3* 9.6* 9.2* 9.3*  HCT  28.7* 30.3* 30.5* 29.1* 30.2*  MCV 94.7 97.1 95.9 95.4 96.5  PLT 285 277 276 251 267   BMP &GFR Recent Labs  Lab 07/20/19 1353 07/23/19 0800 07/24/19 0325 07/25/19 0500  NA 130* 135 134* 133*  K 4.3 4.1 3.8 3.8  CL 94* 94* 93* 94*  CO2 29 32 31 30  GLUCOSE 357* 187* 102* 144*  BUN <5* <5* 5* <5*  CREATININE 0.51* 0.49* 0.52* 0.49*  CALCIUM 8.1* 8.8* 8.5* 8.7*   Estimated Creatinine Clearance: 188.1 mL/min (A) (by C-G formula based on SCr of 0.49 mg/dL (L)). Liver & Pancreas: Recent Labs  Lab 07/20/19 1353  AST 20  ALT 16  ALKPHOS 70  BILITOT 0.3  PROT 6.5  ALBUMIN 1.6*   No results for input(s): LIPASE, AMYLASE in the last 168 hours. No results for input(s): AMMONIA in the last 168 hours. Diabetic: No results for input(s): HGBA1C in the last 72 hours. Recent Labs  Lab 07/25/19 0731 07/25/19 1230 07/25/19 1617 07/25/19 2144 07/26/19 0734  GLUCAP 138* 107* 112* 162* 176*   Cardiac Enzymes: No results for input(s): CKTOTAL, CKMB, CKMBINDEX, TROPONINI in the last 168 hours. No results for input(s): PROBNP in the last 8760 hours. Coagulation Profile: No results for input(s): INR, PROTIME in the last 168 hours. Thyroid Function Tests: No results for input(s): TSH, T4TOTAL, FREET4, T3FREE, THYROIDAB in the last 72 hours. Lipid Profile: No results for input(s): CHOL, HDL, LDLCALC, TRIG, CHOLHDL, LDLDIRECT in the last 72 hours. Anemia Panel: No results for input(s): VITAMINB12, FOLATE, FERRITIN, TIBC, IRON, RETICCTPCT in the last 72 hours. Urine analysis:    Component Value Date/Time   COLORURINE YELLOW 07/06/2019 [place] 07/06/2019  0437   LABSPEC 1.024 07/06/2019 0437   PHURINE 5.0 07/06/2019 0437   GLUCOSEU >=500 (A) 07/06/2019 0437   HGBUR MODERATE (A) 07/06/2019 0437   BILIRUBINUR NEGATIVE 07/06/2019 0437   KETONESUR 80 (A) 07/06/2019 0437   PROTEINUR NEGATIVE 07/06/2019 0437   NITRITE NEGATIVE 07/06/2019 0437   LEUKOCYTESUR NEGATIVE 07/06/2019 0437   Sepsis Labs: Invalid input(s): PROCALCITONIN, [place]  Microbiology: No results found for this or any previous visit (from the past 240 hour(s)).  Radiology Studies: No results found.  Total time spent 28 minutes Ishmael Holter, M.D. [facility]  If 7PM-7AM, please contact night-coverage [email or web address] Password [facility] 07/26/2019, 7:58 AM

## 2019-07-26 NOTE — Progress Notes (Signed)
[facility] for Infectious Disease  Date of Admission:  07/05/2019     Total days of antibiotics 22         ASSESSMENT:  Bradley Cox continues to receive antimicrobial therapy for paraspinal and gluteal abscesses s/p I&D of right hip abscess complicated by MSSA bacteremia. Now has developed increasing drainage to the right proximal lower extremity with concern for additional abscess. MRI imaging is pending. He has remained afebrile and without leukocytosis. If infection/abscess is present this is likely to be MSSA infection and would recommend continuation with current Cefazolin. If this were to be a cellulitis, cefazolin would be an appropriate regimen as most typical organism would be Streptococcus. Recommend continuing Cefazolin pending additional imaging.  PLAN:  1. Continue current Cefazolin. 2. Await results of MRI imaging to determine if abscess is present.   Principal Problem:   MSSA bacteremia Active Problems:   Obesity, Class III, BMI 40-49.9 (morbid obesity) (HCC)   Essential hypertension   Sepsis ([place])   AKI (acute kidney injury) ([place])   Hyperkalemia   Abscess of right hip   Paraspinal abscess (HCC)   Compression of right sciatic nerve   . acetaminophen  500 mg Oral Q12H   Or  . acetaminophen  650 mg Rectal Q12H  . Chlorhexidine Gluconate Cloth  6 each Topical Daily  . enoxaparin (LOVENOX) injection  0.5 mg/kg Subcutaneous Q24H  . furosemide  40 mg Intravenous Daily  . gabapentin  300 mg Oral TID  . hydrALAZINE  25 mg Oral BID  . influenza vac split quadrivalent PF  0.5 mL Intramuscular Tomorrow-1000  . insulin aspart  0-15 Units Subcutaneous TID WC  . insulin aspart  0-5 Units Subcutaneous QHS  . insulin aspart  8 Units Subcutaneous TID WC  . insulin NPH Human  28 Units Subcutaneous BID AC & HS  . methocarbamol  750 mg Oral TID  . metoCLOPramide  10 mg Oral TID AC & HS  . polyethylene glycol  17 g Oral BID  . senna-docusate  1 tablet Oral BID  . sodium  chloride flush  10-40 mL Intracatheter Q12H  . sodium chloride flush  3 mL Intravenous Q12H    SUBJECTIVE:  Bradley Cox was last seen by ID on 07/16/19 with plans for 4 weeks of Cefazolin for paraspinal and gluteal abscesses s/p I&D of the right hip complicated by MSSA bacteremia. Today nursing staff notified orthopedics regarding new drainage from the posteromedial proximal lower right leg that started this morning. He has been receiving Ancef as prescribed with no adverse side effects. MRI ordered to rule out abscess. Bradley Cox has remained afebrile with stable leukocytosis at present.   Bradley Cox has noticed increasing edema and stiffness of his right distal lower extremity over the course of the past week and has had increasing pain at times. Pain is increased with weight bearing. He has had a few episodes of chills but otherwise denies fevers. PICC line doing well without pain or edema.    Allergies  Allergen Reactions  . Penicillins Anaphylaxis    Did it involve swelling of the face/tongue/throat, SOB, or low BP? Yes Did it involve sudden or severe rash/hives, skin peeling, or any reaction on the inside of your mouth or nose? No Did you need to seek medical attention at a hospital or doctor's office? Yes When did it last happen?4 months old If all above answers are "NO", may proceed with cephalosporin use.   . Tramadol Anaphylaxis  Review of Systems: Review of Systems  Constitutional: Negative for chills, fever and weight loss.  Respiratory: Negative for cough, shortness of breath and wheezing.   Cardiovascular: Negative for chest pain and leg swelling.  Gastrointestinal: Negative for abdominal pain, constipation, diarrhea, nausea and vomiting.  Musculoskeletal:       Positive for right lower leg pain and edema.   Skin: Negative for rash.      OBJECTIVE: Vitals:   07/25/19 2331 07/26/19 0345 07/26/19 0734 07/26/19 1111  BP: 110/67 120/76 115/72 124/64  Pulse: (!) 103  (!) 111 97 (!) 102  Resp:   13 15  Temp: 98 F (36.7 C) 98.6 F (37 C) 99 F (37.2 C) 98.5 F (36.9 C)  TempSrc: Oral Oral Oral Oral  SpO2: 97% 97% 97% 94%  Weight:      Height:       Body mass index is 41.78 kg/m.  Physical Exam Constitutional:      General: He is not in acute distress.    Appearance: He is well-developed.     Comments: Lying in bed with head of bed elevated; pleasant.   Cardiovascular:     Rate and Rhythm: Normal rate and regular rhythm.     Heart sounds: Normal heart sounds.  Pulmonary:     Effort: Pulmonary effort is normal.     Breath sounds: Normal breath sounds.  Musculoskeletal:     Comments: Right lower extremity with bandage in place that is clean and dry. There is 1+ pitting edema in the right distal lower extremity with good pulses and sensation.  Skin:    General: Skin is warm and dry.  Neurological:     Mental Status: He is alert and oriented to person, place, and time.  Psychiatric:        Behavior: Behavior normal.        Thought Content: Thought content normal.        Judgment: Judgment normal.     Lab Results Lab Results  Component Value Date   WBC 10.6 (H) 07/26/2019   HGB 9.3 (L) 07/26/2019   HCT 30.2 (L) 07/26/2019   MCV 96.5 07/26/2019   PLT 267 07/26/2019    Lab Results  Component Value Date   CREATININE 0.49 (L) 07/25/2019   BUN <5 (L) 07/25/2019   NA 133 (L) 07/25/2019   K 3.8 07/25/2019   CL 94 (L) 07/25/2019   CO2 30 07/25/2019    Lab Results  Component Value Date   ALT 16 07/20/2019   AST 20 07/20/2019   ALKPHOS 70 07/20/2019   BILITOT 0.3 07/20/2019     Microbiology: No results found for this or any previous visit (from the past 240 hour(s)).   Terri Piedra, NP [facility] [phone] Pager  07/26/2019  2:01 PM

## 2019-07-26 NOTE — Progress Notes (Addendum)
Orthopedic Trauma Service Progress Note  Patient ID: Bradley Cox MRN: [id number] DOB/AGE: [birth year] 48 y.o.  Subjective:  Contacted by nurse regarding new drainage from posteromedial proximal lower R leg. Started this am  Pt notes pain only when touching area  No other concerns elsewhere   Currently on ancef for MSSA from R gluteal/hip abscess  PICC in place  Mildly elevated WBC count this am, afebrile   Appears pt has not been out of bed much   Review of Systems  Constitutional: Negative for fever.  Respiratory: Negative for shortness of breath and wheezing.   Cardiovascular: Negative for chest pain and palpitations.  Gastrointestinal: Negative for abdominal pain, nausea and vomiting.    Objective:   VITALS:   Vitals:   07/25/19 2331 07/26/19 0345 07/26/19 0734 07/26/19 1111  BP: 110/67 120/76 115/72 124/64  Pulse: (!) 103 (!) 111 97 (!) 102  Resp:   13 15  Temp: 98 F (36.7 C) 98.6 F (37 C) 99 F (37.2 C) 98.5 F (36.9 C)  TempSrc: Oral Oral Oral Oral  SpO2: 97% 97% 97% 94%  Weight:      Height:        Estimated body mass index is 41.78 kg/m as calculated from the following:   Height as of this encounter: 6\' 6"  (1.981 m).   Weight as of this encounter: 164 kg.   Intake/Output      09/23 0701 - 09/24 0700 09/24 0701 - 09/25 0700   P.O.     I.V. (mL/kg)     IV Piggyback     Total Intake(mL/kg)     Urine (mL/kg/hr) 2500 (0.6) 2150 (2.2)   Drains 0    Stool 0    Total Output 2500 2150   Net -2500 -2150        Stool Occurrence 1 x      LABS  Results for orders placed or performed during the hospital encounter of 07/05/19 (from the past 24 hour(s))  Glucose, capillary     Status: Abnormal   Collection Time: 07/25/19  4:17 PM  Result Value Ref Range   Glucose-Capillary 112 (H) 70 - 99 mg/dL  Glucose, capillary     Status: Abnormal   Collection Time: 07/25/19  9:44 PM   Result Value Ref Range   Glucose-Capillary 162 (H) 70 - 99 mg/dL  CBC with Differential/Platelet     Status: Abnormal   Collection Time: 07/26/19  4:10 AM  Result Value Ref Range   WBC 10.6 (H) 4.0 - 10.5 K/uL   RBC 3.13 (L) 4.22 - 5.81 MIL/uL   Hemoglobin 9.3 (L) 13.0 - 17.0 g/dL   HCT 30.2 (L) 39.0 - 52.0 %   MCV 96.5 80.0 - 100.0 fL   MCH 29.7 26.0 - 34.0 pg   MCHC 30.8 30.0 - 36.0 g/dL   RDW 14.1 11.5 - 15.5 %   Platelets 267 150 - 400 K/uL   nRBC 0.0 0.0 - 0.2 %   Neutrophils Relative % 74 %   Neutro Abs 8.0 (H) 1.7 - 7.7 K/uL   Lymphocytes Relative 16 %   Lymphs Abs 1.6 0.7 - 4.0 K/uL   Monocytes Relative 6 %   Monocytes Absolute 0.6 0.1 - 1.0 K/uL   Eosinophils Relative 2 %   Eosinophils Absolute  0.2 0.0 - 0.5 K/uL   Basophils Relative 1 %   Basophils Absolute 0.1 0.0 - 0.1 K/uL   Immature Granulocytes 1 %   Abs Immature Granulocytes 0.07 0.00 - 0.07 K/uL  Glucose, capillary     Status: Abnormal   Collection Time: 07/26/19  7:34 AM  Result Value Ref Range   Glucose-Capillary 176 (H) 70 - 99 mg/dL  Glucose, capillary     Status: Abnormal   Collection Time: 07/26/19 11:09 AM  Result Value Ref Range   Glucose-Capillary 129 (H) 70 - 99 mg/dL   CBG (last 3)  Recent Labs    07/25/19 2144 07/26/19 0734 07/26/19 1109  GLUCAP 162* 176* 129*     PHYSICAL EXAM:   Gen: sitting up in bed, NAD, mom at bedside  Lungs: unlabored Cardiac:  Regular  Ext:       Right Lower Extremity   prevena R hip stable and functioning well  + erythema posteriomedial proximal calf   2 punctate areas with purulent drainage however unable to manual express though this process did cause discomfort   Erythema extends to distal thigh and upper 1/3 calf    Soft tissue is indurated    + skin desquamation    No pain with active and passive knee extension/flexion and ankle flexion/extension  + edema R leg  Ext warm   + DP pulse  Distal motor and sensory functions grossly intact    Posteromedial R leg Top= anterior  Left= distal Bottom= posterior  Right= proximal    Assessment/Plan: 6 Days Post-Op   Principal Problem:   MSSA bacteremia Active Problems:   Obesity, Class III, BMI 40-49.9 (morbid obesity) (HCC)   Essential hypertension   Sepsis ([place])   AKI (acute kidney injury) ([place])   Hyperkalemia   Abscess of right hip   Paraspinal abscess ([place])   Compression of right sciatic nerve   Anti-infectives (From admission, onward)   Start     Dose/Rate Route Frequency Ordered Stop   07/17/19 1857  gentamicin (GARAMYCIN) injection  Status:  Discontinued       As needed 07/17/19 1857 07/17/19 2009   07/17/19 1857  vancomycin (VANCOCIN) powder  Status:  Discontinued       As needed 07/17/19 1858 07/17/19 2009   07/10/19 1600  metroNIDAZOLE (FLAGYL) IVPB 500 mg  Status:  Discontinued     500 mg 100 mL/hr over 60 Minutes Intravenous Every 8 hours 07/10/19 1549 07/13/19 0939   07/09/19 0839  vancomycin (VANCOCIN) powder  Status:  Discontinued       As needed 07/09/19 0839 07/09/19 0935   07/09/19 0839  tobramycin (NEBCIN) powder  Status:  Discontinued       As needed 07/09/19 0840 07/09/19 0935   07/07/19 1830  ceFAZolin (ANCEF) IVPB 2g/100 mL premix     2 g 200 mL/hr over 30 Minutes Intravenous Every 8 hours 07/07/19 1333     07/07/19 0900  ceFAZolin (ANCEF) IVPB 2g/100 mL premix  Status:  Discontinued     2 g 200 mL/hr over 30 Minutes Intravenous Every 8 hours 07/07/19 0847 07/07/19 1333   07/06/19 2030  clindamycin (CLEOCIN) IVPB 600 mg  Status:  Discontinued     600 mg 100 mL/hr over 30 Minutes Intravenous Every 8 hours 07/06/19 2010 07/07/19 0847   07/06/19 1800  vancomycin (VANCOCIN) 1,500 mg in sodium chloride 0.9 % 500 mL IVPB  Status:  Discontinued     1,500 mg 250 mL/hr over 120  Minutes Intravenous Every 12 hours 07/06/19 0820 07/07/19 0847   07/06/19 1400  ceFEPIme (MAXIPIME) 2 g in sodium chloride 0.9 % 100 mL IVPB  Status:  Discontinued     2 g  200 mL/hr over 30 Minutes Intravenous Every 8 hours 07/06/19 0504 07/07/19 0847   07/06/19 1400  vancomycin (VANCOCIN) 2,000 mg in sodium chloride 0.9 % 500 mL IVPB  Status:  Discontinued     2,000 mg 250 mL/hr over 120 Minutes Intravenous Every 8 hours 07/06/19 0504 07/06/19 0818   07/06/19 0745  clindamycin (CLEOCIN) IVPB 600 mg  Status:  Discontinued     600 mg 100 mL/hr over 30 Minutes Intravenous  Once 07/06/19 0737 07/06/19 2029   07/06/19 0500  ceFEPIme (MAXIPIME) 2 g in sodium chloride 0.9 % 100 mL IVPB     2 g 200 mL/hr over 30 Minutes Intravenous  Once 07/06/19 0451 07/06/19 1335   07/06/19 0500  vancomycin (VANCOCIN) 2,500 mg in sodium chloride 0.9 % 500 mL IVPB     2,500 mg 250 mL/hr over 120 Minutes Intravenous  Once 07/06/19 0451 07/06/19 1335    .  POD/HD#: 17  48 y/o male with multifocal abscesses s/p I&D right gluteal abscess   - R gluteal abscess s/p serial I&D (MSSA)             dc vac tomorrow              WBAT R leg      Swelling control             Ice prn   - R lower leg cellulitis, ? Abscess  MRI to eval for abscess that can be addressed surgically   Moderate cellulitis present   Will have ID re-eval to see if pt needs changes to regimen   Will also obtain Doppler US                              - Pain management:             norco             Gabapentin    - ID:              Per infectious disease   - Dispo:             follow up on studies  Will make NPO after MN in the event the MRI shows need for surgery    Jari Pigg, PA-C [phone] (C) 07/26/2019, 1:04 PM  [facility] [place] [address] [phone] Domingo Sep (F)

## 2019-07-26 NOTE — TOC Progression Note (Signed)
Transition of Care [facility]) - Progression Note    Patient Details  Name: Bradley Cox MRN: [id number] Date of Birth: November 19, 1970  Transition of [facility]) CM/SW [place], [place] Phone Number: 07/26/2019, 11:03 AM  Clinical Narrative:     CSW visit with the patient at bedside along with his mother. CSW updated family on NO SNF bed offers. Patient and patient's mother is agreeable of discharge plan to discharge home to ( [place], [place] ) once he is medically stable.CSW has spoken with the patient's father and he agreeable with the plan. Patient's mother states patient will have the support of his parents and a their friend, who is a retired [employer] scientist.  Patient will d/c to his father's once he is medically stable.  CSW will continue to follow.  Thurmond Butts, MSW, LCSWA Clinical Social Worker [phone]      Barriers to Discharge: Continued Medical Work up, Inadequate or no insurance, No SNF bed, SNF Pending bed offer  Expected Discharge Plan and Services   In-house Referral: Clinical Social Work     Living arrangements for the past 2 months: Single Family Home                                       Social Determinants of Health (SDOH) Interventions    Readmission Risk Interventions No flowsheet data found.

## 2019-07-26 NOTE — Progress Notes (Signed)
PT Cancellation Note  Patient Details Name: CARA PAYMENT MRN: [id number] DOB: Jul 08, 1971   Cancelled Treatment:    Reason Eval/Treat Not Completed: Medical issues which prohibited therapy; attempted to see pt to progress mobility, but he reports L lower leg with pus and bloody drainage this morning now awaiting MRI.  Will attempt again another day.    Reginia Naas 07/26/2019, 1:10 PM  Magda Kiel, [place] [phone] 07/26/2019

## 2019-07-27 ENCOUNTER — Inpatient Hospital Stay (HOSPITAL_COMMUNITY): Payer: Self-pay | Admitting: Certified Registered Nurse Anesthetist

## 2019-07-27 ENCOUNTER — Encounter (HOSPITAL_COMMUNITY)
Admission: EM | Disposition: A | Discharge status: Rehabilitation Facility | Payer: Self-pay | Source: Home / Self Care | Attending: Family Medicine

## 2019-07-27 ENCOUNTER — Inpatient Hospital Stay (HOSPITAL_COMMUNITY): Payer: Self-pay

## 2019-07-27 DIAGNOSIS — L538 Other specified erythematous conditions: Secondary | ICD-10-CM

## 2019-07-27 DIAGNOSIS — L0291 Cutaneous abscess, unspecified: Secondary | ICD-10-CM

## 2019-07-27 DIAGNOSIS — M7989 Other specified soft tissue disorders: Secondary | ICD-10-CM

## 2019-07-27 HISTORY — PX: I & D EXTREMITY: SHX5045

## 2019-07-27 HISTORY — PX: APPLICATION OF WOUND VAC: SHX5189

## 2019-07-27 LAB — CBC WITH DIFFERENTIAL/PLATELET
Abs Immature Granulocytes: 0.04 10*3/uL (ref 0.00–0.07)
Basophils Absolute: 0 10*3/uL (ref 0.0–0.1)
Basophils Relative: 0 %
Eosinophils Absolute: 0.2 10*3/uL (ref 0.0–0.5)
Eosinophils Relative: 2 %
HCT: 28.4 % — ABNORMAL LOW (ref 39.0–52.0)
Hemoglobin: 8.9 g/dL — ABNORMAL LOW (ref 13.0–17.0)
Immature Granulocytes: 0 %
Lymphocytes Relative: 16 %
Lymphs Abs: 1.6 10*3/uL (ref 0.7–4.0)
MCH: 29.8 pg (ref 26.0–34.0)
MCHC: 31.3 g/dL (ref 30.0–36.0)
MCV: 95 fL (ref 80.0–100.0)
Monocytes Absolute: 0.6 10*3/uL (ref 0.1–1.0)
Monocytes Relative: 7 %
Neutro Abs: 7.2 10*3/uL (ref 1.7–7.7)
Neutrophils Relative %: 75 %
Platelets: 248 10*3/uL (ref 150–400)
RBC: 2.99 MIL/uL — ABNORMAL LOW (ref 4.22–5.81)
RDW: 13.8 % (ref 11.5–15.5)
WBC: 9.7 10*3/uL (ref 4.0–10.5)
nRBC: 0 % (ref 0.0–0.2)

## 2019-07-27 LAB — GLUCOSE, CAPILLARY
Glucose-Capillary: 136 mg/dL — ABNORMAL HIGH (ref 70–99)
Glucose-Capillary: 129 mg/dL — ABNORMAL HIGH (ref 70–99)
Glucose-Capillary: 136 mg/dL — ABNORMAL HIGH (ref 70–99)
Glucose-Capillary: 179 mg/dL — ABNORMAL HIGH (ref 70–99)
Glucose-Capillary: 192 mg/dL — ABNORMAL HIGH (ref 70–99)

## 2019-07-27 SURGERY — IRRIGATION AND DEBRIDEMENT EXTREMITY
Anesthesia: General | Site: Thigh | Laterality: Right

## 2019-07-27 MED ORDER — ONDANSETRON HCL 4 MG/2ML IJ SOLN
INTRAMUSCULAR | Status: AC
  Filled 2019-07-27: qty 2

## 2019-07-27 MED ORDER — ALBUMIN HUMAN 5 % IV SOLN
INTRAVENOUS | Status: DC | PRN
  Administered 2019-07-27: 13:00:00 via INTRAVENOUS

## 2019-07-27 MED ORDER — LIDOCAINE 2% (20 MG/ML) 5 ML SYRINGE
INTRAMUSCULAR | Status: AC
  Filled 2019-07-27: qty 5

## 2019-07-27 MED ORDER — ROCURONIUM BROMIDE 10 MG/ML (PF) SYRINGE
PREFILLED_SYRINGE | INTRAVENOUS | Status: DC | PRN
  Administered 2019-07-27: 50 mg via INTRAVENOUS

## 2019-07-27 MED ORDER — FENTANYL CITRATE (PF) 100 MCG/2ML IJ SOLN
INTRAMUSCULAR | Status: AC
  Filled 2019-07-27: qty 2

## 2019-07-27 MED ORDER — SODIUM CHLORIDE 0.9 % IR SOLN
Status: DC | PRN
  Administered 2019-07-27: 3000 mL

## 2019-07-27 MED ORDER — FENTANYL CITRATE (PF) 100 MCG/2ML IJ SOLN
25.0000 ug | INTRAMUSCULAR | Status: DC | PRN
  Administered 2019-07-27 (×3): 50 ug via INTRAVENOUS

## 2019-07-27 MED ORDER — ACETAMINOPHEN 500 MG PO TABS
1000.0000 mg | ORAL_TABLET | Freq: Once | ORAL | Status: AC
  Administered 2019-07-27: 11:00:00 1000 mg via ORAL
  Filled 2019-07-27: qty 2

## 2019-07-27 MED ORDER — MIDAZOLAM HCL 2 MG/2ML IJ SOLN
INTRAMUSCULAR | Status: AC
  Filled 2019-07-27: qty 2

## 2019-07-27 MED ORDER — PROPOFOL 10 MG/ML IV BOLUS
INTRAVENOUS | Status: DC | PRN
  Administered 2019-07-27: 200 mg via INTRAVENOUS

## 2019-07-27 MED ORDER — PHENYLEPHRINE 40 MCG/ML (10ML) SYRINGE FOR IV PUSH (FOR BLOOD PRESSURE SUPPORT)
PREFILLED_SYRINGE | INTRAVENOUS | Status: AC
  Filled 2019-07-27: qty 10

## 2019-07-27 MED ORDER — ONDANSETRON HCL 4 MG/2ML IJ SOLN
INTRAMUSCULAR | Status: DC | PRN
  Administered 2019-07-27: 4 mg via INTRAVENOUS

## 2019-07-27 MED ORDER — SUCCINYLCHOLINE CHLORIDE 200 MG/10ML IV SOSY
PREFILLED_SYRINGE | INTRAVENOUS | Status: DC | PRN
  Administered 2019-07-27: 160 mg via INTRAVENOUS

## 2019-07-27 MED ORDER — PHENYLEPHRINE HCL (PRESSORS) 10 MG/ML IV SOLN
INTRAVENOUS | Status: DC | PRN
  Administered 2019-07-27: 80 ug via INTRAVENOUS
  Administered 2019-07-27: 120 ug via INTRAVENOUS
  Administered 2019-07-27 (×2): 80 ug via INTRAVENOUS
  Administered 2019-07-27: 120 ug via INTRAVENOUS
  Administered 2019-07-27 (×2): 80 ug via INTRAVENOUS
  Administered 2019-07-27: 40 ug via INTRAVENOUS

## 2019-07-27 MED ORDER — FENTANYL CITRATE (PF) 250 MCG/5ML IJ SOLN
INTRAMUSCULAR | Status: AC
  Filled 2019-07-27: qty 5

## 2019-07-27 MED ORDER — SUGAMMADEX SODIUM 500 MG/5ML IV SOLN
INTRAVENOUS | Status: AC
  Filled 2019-07-27: qty 5

## 2019-07-27 MED ORDER — PROPOFOL 10 MG/ML IV BOLUS
INTRAVENOUS | Status: AC
  Filled 2019-07-27: qty 40

## 2019-07-27 MED ORDER — MIDAZOLAM HCL 2 MG/2ML IJ SOLN
INTRAMUSCULAR | Status: DC | PRN
  Administered 2019-07-27: 2 mg via INTRAVENOUS

## 2019-07-27 MED ORDER — 0.9 % SODIUM CHLORIDE (POUR BTL) OPTIME
TOPICAL | Status: DC | PRN
  Administered 2019-07-27 (×2): 3000 mL

## 2019-07-27 MED ORDER — LIDOCAINE 2% (20 MG/ML) 5 ML SYRINGE
INTRAMUSCULAR | Status: DC | PRN
  Administered 2019-07-27: 100 mg via INTRAVENOUS

## 2019-07-27 MED ORDER — DEXAMETHASONE SODIUM PHOSPHATE 10 MG/ML IJ SOLN
INTRAMUSCULAR | Status: AC
  Filled 2019-07-27: qty 1

## 2019-07-27 MED ORDER — SUGAMMADEX SODIUM 200 MG/2ML IV SOLN
INTRAVENOUS | Status: DC | PRN
  Administered 2019-07-27: 350 mg via INTRAVENOUS

## 2019-07-27 MED ORDER — FENTANYL CITRATE (PF) 250 MCG/5ML IJ SOLN
INTRAMUSCULAR | Status: DC | PRN
  Administered 2019-07-27: 25 ug via INTRAVENOUS
  Administered 2019-07-27: 50 ug via INTRAVENOUS
  Administered 2019-07-27: 25 ug via INTRAVENOUS
  Administered 2019-07-27: 50 ug via INTRAVENOUS
  Administered 2019-07-27: 100 ug via INTRAVENOUS

## 2019-07-27 MED ORDER — PROMETHAZINE HCL 25 MG/ML IJ SOLN: 6.2500 mg | INTRAMUSCULAR | Status: DC | PRN

## 2019-07-27 SURGICAL SUPPLY — 54 items
BNDG COHESIVE 4X5 TAN STRL (GAUZE/BANDAGES/DRESSINGS) ×3 IMPLANT
BNDG GAUZE ELAST 4 BULKY (GAUZE/BANDAGES/DRESSINGS) ×6 IMPLANT
BRUSH SCRUB EZ PLAIN DRY (MISCELLANEOUS) ×6 IMPLANT
CHLORAPREP W/TINT 26 (MISCELLANEOUS) ×3 IMPLANT
CONNECTOR Y ATS VAC SYSTEM (MISCELLANEOUS) ×2 IMPLANT
COVER MAYO STAND STRL (DRAPES) ×3 IMPLANT
COVER SURGICAL LIGHT HANDLE (MISCELLANEOUS) ×6 IMPLANT
COVER WAND RF STERILE (DRAPES) ×3 IMPLANT
DRAPE INCISE IOBAN 66X45 STRL (DRAPES) ×4 IMPLANT
DRAPE ORTHO SPLIT 77X108 STRL (DRAPES) ×2
DRAPE SURG 17X23 STRL (DRAPES) ×3 IMPLANT
DRAPE SURG ORHT 6 SPLT 77X108 (DRAPES) ×1 IMPLANT
DRAPE U-SHAPE 47X51 STRL (DRAPES) ×3 IMPLANT
DRSG ADAPTIC 3X8 NADH LF (GAUZE/BANDAGES/DRESSINGS) ×3 IMPLANT
DRSG VAC ATS LRG SENSATRAC (GAUZE/BANDAGES/DRESSINGS) ×2 IMPLANT
ELECT REM PT RETURN 9FT ADLT (ELECTROSURGICAL)
ELECTRODE REM PT RTRN 9FT ADLT (ELECTROSURGICAL) IMPLANT
EVACUATOR 1/8 PVC DRAIN (DRAIN) IMPLANT
GAUZE SPONGE 4X4 12PLY STRL (GAUZE/BANDAGES/DRESSINGS) ×3 IMPLANT
GLOVE BIO SURGEON STRL SZ 6.5 (GLOVE) ×7 IMPLANT
GLOVE BIO SURGEON STRL SZ7.5 (GLOVE) ×12 IMPLANT
GLOVE BIO SURGEONS STRL SZ 6.5 (GLOVE) ×4
GLOVE BIOGEL PI IND STRL 6.5 (GLOVE) ×1 IMPLANT
GLOVE BIOGEL PI IND STRL 7.5 (GLOVE) ×1 IMPLANT
GLOVE BIOGEL PI INDICATOR 6.5 (GLOVE) ×2
GLOVE BIOGEL PI INDICATOR 7.5 (GLOVE) ×2
GOWN STRL REUS W/ TWL LRG LVL3 (GOWN DISPOSABLE) ×2 IMPLANT
GOWN STRL REUS W/TWL LRG LVL3 (GOWN DISPOSABLE) ×4
HANDPIECE INTERPULSE COAX TIP (DISPOSABLE)
KIT BASIN OR (CUSTOM PROCEDURE TRAY) ×3 IMPLANT
KIT TURNOVER KIT B (KITS) ×3 IMPLANT
MANIFOLD NEPTUNE II (INSTRUMENTS) ×3 IMPLANT
NS IRRIG 1000ML POUR BTL (IV SOLUTION) ×3 IMPLANT
PACK ORTHO EXTREMITY (CUSTOM PROCEDURE TRAY) ×3 IMPLANT
PAD ARMBOARD 7.5X6 YLW CONV (MISCELLANEOUS) ×6 IMPLANT
PAD NEG PRESSURE SENSATRAC (MISCELLANEOUS) ×2 IMPLANT
PADDING CAST COTTON 6X4 STRL (CAST SUPPLIES) ×3 IMPLANT
SET HNDPC FAN SPRY TIP SCT (DISPOSABLE) IMPLANT
SPONGE LAP 18X18 RF (DISPOSABLE) ×3 IMPLANT
SPONGE LAP 18X18 X RAY DECT (DISPOSABLE) IMPLANT
STAPLER VISISTAT 35W (STAPLE) ×2 IMPLANT
SUT ETHILON 2 0 FS 18 (SUTURE) ×6 IMPLANT
SUT ETHILON 2 0 PSLX (SUTURE) ×6 IMPLANT
SUT ETHILON 3 0 PS 1 (SUTURE) ×6 IMPLANT
SUT MON AB 2-0 CT1 36 (SUTURE) ×3 IMPLANT
SUT PDS AB 0 CT 36 (SUTURE) IMPLANT
SWAB CULTURE ESWAB REG 1ML (MISCELLANEOUS) IMPLANT
TOWEL GREEN STERILE (TOWEL DISPOSABLE) ×6 IMPLANT
TOWEL GREEN STERILE FF (TOWEL DISPOSABLE) ×3 IMPLANT
TUBE CONNECTING 12'X1/4 (SUCTIONS) ×1
TUBE CONNECTING 12X1/4 (SUCTIONS) ×2 IMPLANT
UNDERPAD 30X30 (UNDERPADS AND DIAPERS) ×3 IMPLANT
WATER STERILE IRR 1000ML POUR (IV SOLUTION) ×3 IMPLANT
YANKAUER SUCT BULB TIP NO VENT (SUCTIONS) ×3 IMPLANT

## 2019-07-27 NOTE — Transfer of Care (Signed)
Immediate Anesthesia Transfer of Care Note  Patient: Bradley Cox  Procedure(s) Performed: IRRIGATION AND DEBRIDEMENT EXTREMITY Right Leg (Right Thigh) Application Of Wound Vac (Right Thigh)  Patient Location: PACU  Anesthesia Type:General  Level of Consciousness: awake, alert  and oriented  Airway & Oxygen Therapy: Patient Spontanous Breathing and Patient connected to face mask oxygen  Post-op Assessment: Report given to RN and Post -op Vital signs reviewed and stable  Post vital signs: Reviewed and stable  Last Vitals:  Vitals Value Taken Time  BP 108/70 07/27/19 1335  Temp    Pulse 105 07/27/19 1338  Resp 15 07/27/19 1338  SpO2 100 % 07/27/19 1338  Vitals shown include unvalidated device data.  Last Pain:  Vitals:   07/27/19 1045  TempSrc:   PainSc: 6       Patients Stated Pain Goal: 2 ([id number])  Complications: No apparent anesthesia complications

## 2019-07-27 NOTE — Progress Notes (Addendum)
[facility] PROGRESS NOTE  Bradley Cox [id number] DOB: 1971-06-28 DOA: 07/05/2019 PCP: Simona Huh, NP  Assessment/Plan:  Sepsis/MSSA bacteremia/L3-S1 epidural abscess/right iliopsoas and gluteal abscess/right lower extremity cellulitis/abscess left posterior thigh -long hospitalization -Currently on cefazolin per ID.  Plan is 6 to 8 weeks of IV Ancef.  -Blood and tissue cultures on 9/4-growing MSSA. Repeat blood culture on 9/7 negative  -TTE and TEE did not show any vegetation or any thrombus. -PICC line placed 9/12. -OR: 9/15, 9/18 and 9/25 -9/25: MRI reveals Large complex collection consistent with an abscess extending throughout the entire visible posterior thigh, centered on the posterior compartment involving the hamstring musculature. The collection measures 11 x 9 cm transversely. It extends through the visualized thigh across the knee to the upper leg. There is surrounding soft tissue edema/cellulitis.   Ileus/constipation/possible gastric outlet obstruction versus possible gastroparesis Resolved.  Has been tolerating diet for very long time.   Uncontrolled NIDDM-2/DKA: DKA resolved. Hemoglobin A1c of 12.6.  Blood sugar control.  Continue current dose of pre-meal regimen, SSI and twice daily insulin NPH.  -9/25: fair control   Hyponatremia: Mild and stable.  -bmet in am   AKI:  Resolved    Hypokalemia:  Resolved.   Hypertension:  Normotensive. -Continue hydralazine scheduled and as needed.   GERD: Continue PPI   Morbid obesity:  BMI 42.6 Diet and exercise Further management on outpatient basis   Bilateral lower extremity edema: No known history of CHF.  Likely dependent since he is not able to walk much.  We will give IV Lasix 40 mg daily starting today for total of 3 days. Last dose 9/26.       Code Status: full Family Communication: patient Disposition Plan: to be determined   Consultants: Ortho  ID  Procedures:  9/6-incision and  drainage of paraspinal abscess 07/09/2019 neuroplasty of the right sciatic nerve by neurosurgery 9/9-TEE Incision and drainage of right gluteal abscess on 07/17/2019 Irrigation and debridement of right hip on 07/20/2019   Antibiotics: 9/4-SARS-CoV-2 screen negative 9/4-blood and tissue cultures-MSSA 9/7-blood cultures negative  HPI/Subjective: Lying in bed complains pain left thigh.   Objective: Vitals:   07/27/19 0453 07/27/19 0752  BP: 119/66 112/79  Pulse: (!) 106 94  Resp: 19 18  Temp: 98.7 F (37.1 C) 97.7 F (36.5 C)  SpO2: 92% 92%    Intake/Output Summary (Last 24 hours) at 07/27/2019 0842 Last data filed at 07/27/2019 0456 Gross per 24 hour  Intake --  Output 4750 ml  Net -4750 ml   Filed Weights   07/05/19 1807 07/06/19 2116  Weight: (!) 167.4 kg (!) 164 kg    Exam:  General:  Awake alert obese no acute distress Cardiovascular: rrr no mgr no trace LE edema bilaterally Respiratory: normal effort BS clear bilaterally no wheeze Abdomen: obese soft +BS no guarding or rebounding Musculoskeletal: dressing/wound vac intact left hip, left thigh dressing dry and intact    Data Reviewed: Basic Metabolic Panel: Recent Labs  Lab 07/20/19 1353 07/23/19 0800 07/24/19 0325 07/25/19 0500  NA 130* 135 134* 133*  K 4.3 4.1 3.8 3.8  CL 94* 94* 93* 94*  CO2 29 32 31 30  GLUCOSE 357* 187* 102* 144*  BUN <5* <5* 5* <5*  CREATININE 0.51* 0.49* 0.52* 0.49*  CALCIUM 8.1* 8.8* 8.5* 8.7*   Liver Function Tests: Recent Labs  Lab 07/20/19 1353  AST 20  ALT 16  ALKPHOS 70  BILITOT 0.3  PROT 6.5  ALBUMIN 1.6*  No results for input(s): LIPASE, AMYLASE in the last 168 hours. No results for input(s): AMMONIA in the last 168 hours. CBC: Recent Labs  Lab 07/23/19 0500 07/24/19 0325 07/25/19 0500 07/26/19 0410 07/27/19 0358  WBC 9.4 10.0 8.9 10.6* 9.7  NEUTROABS 7.3 7.6 6.6 8.0* 7.2  HGB 9.3* 9.6* 9.2* 9.3* 8.9*  HCT 30.3* 30.5* 29.1* 30.2* 28.4*  MCV 97.1 95.9  95.4 96.5 95.0  PLT 277 276 251 267 248   Cardiac Enzymes: No results for input(s): CKTOTAL, CKMB, CKMBINDEX, TROPONINI in the last 168 hours. BNP (last 3 results) No results for input(s): BNP in the last 8760 hours.  ProBNP (last 3 results) No results for input(s): PROBNP in the last 8760 hours.  CBG: Recent Labs  Lab 07/26/19 0734 07/26/19 1109 07/26/19 1548 07/26/19 2308 07/27/19 0750  GLUCAP 176* 129* 129* 132* 136*    No results found for this or any previous visit (from the past 240 hour(s)).   Studies: Mr Tibia Fibula Right W Wo Contrast  Result Date: 07/26/2019 CLINICAL DATA:  drainage posteromedial R knee, eval for abscess. Contacted by nurse regarding new drainage from posteromedial proximal lower R leg. Started this am Pt notes pain only when touching areaNo other concerns elsewhere Currently on ancef for MSSA from R gluteal/hip abscess EXAM: MRI OF LOWER RIGHT EXTREMITY WITHOUT AND WITH CONTRAST TECHNIQUE: Multiplanar, multisequence MR imaging of the right lower extremity was performed both before and after administration of intravenous contrast. CONTRAST:  25mL GADAVIST GADOBUTROL 1 MMOL/ML IV SOLN COMPARISON:  None. FINDINGS: Soft tissues: Large complex fluid collection extends from the thigh across the posterior knee. It begins above the included field of view and thus is entire extent is not imaged on this exam. The collection terminates in the posterosuperior calf, below the knee. There is peripheral wall enhancement and septal enhancement throughout the collection. Small foci of signal void are noted within the fluid collection consistent with bubbles of air. Transversely, the collection measures a maximum of 11 x 9 cm. The collection is at least 27 cm from superior to inferior, but extends above the included field of view and therefore is longer from superior to inferior. The collection is centered on the hamstrings. The neurovascular structures are displaced anteriorly.  There is diffuse surrounding soft tissue edema/cellulitis. Osseous structures: Normal signal. No evidence of edema or osteomyelitis. Right knee joint is normally spaced and aligned. No joint effusion. Visualized tendons are intact, normal in signal. IMPRESSION: 1. Large complex collection consistent with an abscess extending throughout the entire visible posterior thigh, centered on the posterior compartment involving the hamstring musculature. The collection measures 11 x 9 cm transversely. It extends through the visualized thigh across the knee to the upper leg. There is surrounding soft tissue edema/cellulitis. 2. No evidence of osteomyelitis. Knee joint is unremarkable with no effusion to suggest septic arthritis. Electronically Signed   By: Lajean Manes M.D.   On: 07/26/2019 21:17    Scheduled Meds:  acetaminophen  500 mg Oral Q12H   Or   acetaminophen  650 mg Rectal Q12H   Chlorhexidine Gluconate Cloth  6 each Topical Daily   enoxaparin (LOVENOX) injection  0.5 mg/kg Subcutaneous Q24H   furosemide  40 mg Intravenous Daily   gabapentin  300 mg Oral TID   hydrALAZINE  25 mg Oral BID   influenza vac split quadrivalent PF  0.5 mL Intramuscular Tomorrow-1000   insulin aspart  0-15 Units Subcutaneous TID WC   insulin aspart  0-5  Units Subcutaneous QHS   insulin aspart  8 Units Subcutaneous TID WC   insulin NPH Human  28 Units Subcutaneous BID AC & HS   methocarbamol  750 mg Oral TID   metoCLOPramide  10 mg Oral TID AC & HS   polyethylene glycol  17 g Oral BID   senna-docusate  1 tablet Oral BID   sodium chloride flush  10-40 mL Intracatheter Q12H   sodium chloride flush  3 mL Intravenous Q12H   Continuous Infusions:   ceFAZolin (ANCEF) IV 2 g (07/27/19 0213)   lactated ringers      Principal Problem:   MSSA bacteremia Active Problems:   Obesity, Class III, BMI 40-49.9 (morbid obesity) ([place])   Essential hypertension   Sepsis (HCC)   AKI (acute kidney injury) ([place])   Hyperkalemia    Abscess of right hip   Paraspinal abscess (HCC)   Compression of right sciatic nerve    Time spent: 40 minutes    [facility] M NP  [facility]  If 7PM-7AM, please contact night-coverage at [email or web address], password [facility] 07/27/2019, 8:42 AM  LOS: 21 days

## 2019-07-27 NOTE — Progress Notes (Signed)
He is currently in the OR for his abscess.  No changes from an ID standpoint. We will follow up again on Monday. Dr. Tommy Medal available over the weekend if needed.  Thayer Headings, MD

## 2019-07-27 NOTE — Anesthesia Postprocedure Evaluation (Signed)
Anesthesia Post Note  Patient: Bradley Cox  Procedure(s) Performed: IRRIGATION AND DEBRIDEMENT EXTREMITY Right Leg (Right Thigh) Application Of Wound Vac (Right Thigh)     Patient location during evaluation: PACU Anesthesia Type: General Level of consciousness: awake and alert Pain management: pain level controlled Vital Signs Assessment: post-procedure vital signs reviewed and stable Respiratory status: spontaneous breathing, nonlabored ventilation, respiratory function stable and patient connected to nasal cannula oxygen Cardiovascular status: blood pressure returned to baseline, stable and tachycardic Postop Assessment: no apparent nausea or vomiting Anesthetic complications: no    Last Vitals:  Vitals:   07/27/19 1420 07/27/19 1435  BP: 108/68 111/70  Pulse: (!) 110 (!) 108  Resp: 20 14  Temp:  37.1 C  SpO2: 93% 94%    Last Pain:  Vitals:   07/27/19 1435  TempSrc:   PainSc: 4                  Catalina Gravel

## 2019-07-27 NOTE — Progress Notes (Signed)
RLE venous duplex       has been completed. Preliminary results can be found under CV proc through chart review. June Leap, BS, RDMS, RVT   Positive results given to RN

## 2019-07-27 NOTE — Op Note (Signed)
Orthopaedic Surgery Operative Note (CSN: [id number] ) Date of Surgery: 07/27/2019  Admit Date: 07/05/2019   Diagnoses: Pre-Op Diagnoses: Right posterior thigh abscess  Post-Op Diagnosis: Same  Procedures: 1. CPT 27301-Incision and drainage of right posterior thigh abscess 2. CPT 97605-Wound vac placement   Surgeons : Primary: Haddix, Thomasene Lot, MD  Assistant: Patrecia Pace, PA-C  Location: OR 3   Anesthesia:General  Antibiotics: Schedule ancef   Tourniquet time:None  Estimated Blood 123456 mL  Complications:* No complications entered in OR log *   Specimens: ID Type Source Tests Collected by Time Destination  A : Right thigh abscess  Tissue PATH Other GRAM STAIN, AEROBIC/ANAEROBIC CULTURE (SURGICAL/DEEP WOUND) Haddix, Thomasene Lot, MD 07/27/2019 1256      Implants: * No implants in log *   Indications for Surgery: 48 year old male who had multiple abscesses that underwent a gluteal abscess I&D.  He subsequently continued to drain and was taken for repeat incision and debridement by Dr. Marcelino Scot.  Unfortunately he developed a spontaneous drainage on the posterior aspect of his right knee.  An MRI performed showed a massive abscess in the posterior thigh that extended all the way in the posterior compartment across the knee.  Due to the size and complexity of the abscess I felt that it was necessary to perform an incision and drainage of this.  The patient understood and agreed to proceed with surgery and consent was obtained.  Operative Findings: 1. Massive posterior thigh abscess with approximately 2 L of pus that was evacuated 2.  Wound VAC placement to right posterior thigh.  Procedure: The patient was identified in the preoperative holding area. Consent was confirmed with the patient and their family and all questions were answered. The operative extremity was marked after confirmation with the patient. he was then brought back to the operating room by our anesthesia colleagues.   He was placed under general anesthetic and carefully transferred to a radiolucent flat top table.  He was placed prone.  All bony prominences were well-padded.  The right lower extremity was then prepped draped in usual sterile fashion.  Timeout was performed to verify the patient procedure and the extremity.  I made a large incision along the posterior aspect of the lower leg and thigh.  The incision in length was approximately 30 cm.  After I made incision through the skin I encountered a large pocket of pus.  I bluntly dissected down deep to the fascia of the posterior compartment around the hamstring.  Here I encountered more purulent material.  In all it was approximately 2 L of pus.  It clearly extended proximally in the gluteal region.  However I was able to evacuate the majority of the purulence.  I then performed pulsatile lavage with 6 L of normal saline.  I then placed a wound VAC and loosely approximated the skin edges.  The wound VAC was connected to 125 mmHg.  Good suction was obtained.  The patient was then awoken from anesthesia and taken the PACU in stable condition.  Post Op Plan/Instructions: Patient will continue on IV antibiotics.  We will plan to return to the operating room for wound VAC change and possible closure on Monday.  Continue DVT prophylaxis per primary team.  I was present and performed the entire surgery.  Patrecia Pace, PA-C did assist me throughout the case. An assistant was necessary given the difficulty in approach, maintenance of reduction and ability to instrument the fracture.   Katha Hamming, MD [facility]

## 2019-07-27 NOTE — Anesthesia Procedure Notes (Signed)
Procedure Name: Intubation Date/Time: 07/27/2019 12:21 PM Performed by: Clearnce Sorrel, CRNA Pre-anesthesia Checklist: Patient identified, Emergency Drugs available, Suction available, Patient being monitored and Timeout performed Patient Re-evaluated:Patient Re-evaluated prior to induction Oxygen Delivery Method: Circle system utilized Preoxygenation: Pre-oxygenation with 100% oxygen Induction Type: IV induction Ventilation: Mask ventilation without difficulty Laryngoscope Size: Mac and 4 Grade View: Grade I Tube type: Oral Tube size: 7.5 mm Number of attempts: 1 Airway Equipment and Method: Stylet Placement Confirmation: ETT inserted through vocal cords under direct vision,  positive ETCO2 and breath sounds checked- equal and bilateral Secured at: 25 cm Tube secured with: Tape Dental Injury: Teeth and Oropharynx as per pre-operative assessment

## 2019-07-27 NOTE — Progress Notes (Signed)
PT Cancellation Note  Patient Details Name: Bradley Cox MRN: [id number] DOB: [birth year]   Cancelled Treatment:    Reason Eval/Treat Not Completed: Medical issues which prohibited therapy; noted plans for I&D of R post thigh abscess.  Will attempt another day.    Reginia Naas 07/27/2019, 8:31 AM  Magda Kiel, [place] [phone] 07/27/2019

## 2019-07-27 NOTE — Progress Notes (Signed)
Ortho Progress Note  Drainage from posterior knee. MRI with massive abscess around distal thigh in hamstring region. Will plan for I&D and wound vac placement. Risks and benefits discussed. Patient agrees. Continue NPO.  Shona Needles, MD [facility] [phone] (phone) [phone] (office) [email or web address]

## 2019-07-27 NOTE — Anesthesia Preprocedure Evaluation (Addendum)
Anesthesia Evaluation  Patient identified by MRN, date of birth, ID band Patient awake    Reviewed: Allergy & Precautions, NPO status , Patient's Chart, lab work & pertinent test results  Airway Mallampati: II  TM Distance: >3 FB Neck ROM: Full    Dental  (+) Dental Advisory Given, Teeth Intact   Pulmonary asthma , sleep apnea ,    Pulmonary exam normal breath sounds clear to auscultation       Cardiovascular hypertension, Pt. on medications (-) angina(-) CAD and (-) Past MI  Rhythm:Regular Rate:Tachycardia  TEE 07/11/19: r/o endocarditis in setting of bacteremia 1. The left ventricle has normal systolic function, with an ejection fraction of 60-65%. There is moderate concentric left ventricular hypertrophy. No evidence of left ventricular regional wall motion abnormalities.  2. The right ventricle has normal systolc function. The cavity was normal. There is no increase in right ventricular wall thickness.  3. Left atrial size was moderately dilated.  4. No evidence of a thrombus present in the left atrial appendage.  5. The aortic root, ascending aorta, aortic arch and descending aorta are normal in size and structure.  6. A linear 11 mm echodensity on the ventricular surface of the anterior leaflet is most likely a redundant chorda tendina and is unlikely to represent vegetation.    Neuro/Psych  Neuromuscular disease negative psych ROS   GI/Hepatic Neg liver ROS, GERD  ,  Endo/Other  diabetes, Type 2, Oral Hypoglycemic AgentsMorbid obesity  Renal/GU negative Renal ROS     Musculoskeletal  (+) Arthritis , R leg abscess   Abdominal   Peds  Hematology  (+) Blood dyscrasia, anemia ,   Anesthesia Other Findings Day of surgery medications reviewed with the patient.  Reproductive/Obstetrics                            Anesthesia Physical Anesthesia Plan  ASA: II  Anesthesia Plan: General    Post-op Pain Management:    Induction: Intravenous  PONV Risk Score and Plan: 2 and Midazolam, Dexamethasone and Ondansetron  Airway Management Planned: Oral ETT  Additional Equipment:   Intra-op Plan:   Post-operative Plan: Extubation in OR  Informed Consent: I have reviewed the patients History and Physical, chart, labs and discussed the procedure including the risks, benefits and alternatives for the proposed anesthesia with the patient or authorized representative who has indicated his/her understanding and acceptance.     Dental advisory given  Plan Discussed with: CRNA  Anesthesia Plan Comments:        Anesthesia Quick Evaluation

## 2019-07-28 ENCOUNTER — Encounter (HOSPITAL_COMMUNITY): Payer: Self-pay | Admitting: Student

## 2019-07-28 LAB — GLUCOSE, CAPILLARY
Glucose-Capillary: 158 mg/dL — ABNORMAL HIGH (ref 70–99)
Glucose-Capillary: 207 mg/dL — ABNORMAL HIGH (ref 70–99)
Glucose-Capillary: 182 mg/dL — ABNORMAL HIGH (ref 70–99)
Glucose-Capillary: 113 mg/dL — ABNORMAL HIGH (ref 70–99)

## 2019-07-28 LAB — BASIC METABOLIC PANEL
Anion gap: 9 (ref 5–15)
BUN: 5 mg/dL — ABNORMAL LOW (ref 6–20)
CO2: 30 mmol/L (ref 22–32)
Calcium: 8.4 mg/dL — ABNORMAL LOW (ref 8.9–10.3)
Chloride: 91 mmol/L — ABNORMAL LOW (ref 98–111)
Creatinine, Ser: 0.6 mg/dL — ABNORMAL LOW (ref 0.61–1.24)
GFR calc Af Amer: >60 mL/min (ref >60)
GFR calc non Af Amer: >60 mL/min (ref >60)
Glucose, Bld: 161 mg/dL — ABNORMAL HIGH (ref 70–99)
Potassium: 4.1 mmol/L (ref 3.5–5.1)
Sodium: 130 mmol/L — ABNORMAL LOW (ref 135–145)

## 2019-07-28 LAB — CBC WITH DIFFERENTIAL/PLATELET
Abs Immature Granulocytes: 0.04 10*3/uL (ref 0.00–0.07)
Basophils Absolute: 0 10*3/uL (ref 0.0–0.1)
Basophils Relative: 0 %
Eosinophils Absolute: 0.2 10*3/uL (ref 0.0–0.5)
Eosinophils Relative: 2 %
HCT: 22.6 % — ABNORMAL LOW (ref 39.0–52.0)
Hemoglobin: 7.3 g/dL — ABNORMAL LOW (ref 13.0–17.0)
Immature Granulocytes: 1 %
Lymphocytes Relative: 18 %
Lymphs Abs: 1.6 10*3/uL (ref 0.7–4.0)
MCH: 30.3 pg (ref 26.0–34.0)
MCHC: 32.3 g/dL (ref 30.0–36.0)
MCV: 93.8 fL (ref 80.0–100.0)
Monocytes Absolute: 0.8 10*3/uL (ref 0.1–1.0)
Monocytes Relative: 9 %
Neutro Abs: 6.1 10*3/uL (ref 1.7–7.7)
Neutrophils Relative %: 70 %
Platelets: 263 10*3/uL (ref 150–400)
RBC: 2.41 MIL/uL — ABNORMAL LOW (ref 4.22–5.81)
RDW: 14.1 % (ref 11.5–15.5)
WBC: 8.8 10*3/uL (ref 4.0–10.5)
nRBC: 0 % (ref 0.0–0.2)

## 2019-07-28 LAB — CULTURE, FUNGUS WITHOUT SMEAR

## 2019-07-28 LAB — LIPID PANEL
Cholesterol: 108 mg/dL (ref 0–200)
HDL: 18 mg/dL — ABNORMAL LOW (ref >40)
LDL Cholesterol: 64 mg/dL (ref 0–99)
Total CHOL/HDL Ratio: 6 RATIO
Triglycerides: 130 mg/dL (ref <150)
VLDL: 26 mg/dL (ref 0–40)

## 2019-07-28 NOTE — Progress Notes (Signed)
Physical Therapy Treatment Patient Details Name: Bradley Cox MRN: [id number] DOB: 06/04/1971 Today's Date: 07/28/2019    History of Present Illness Patient is a 48 y/o male who presents with right flank pain. Admitted with disseminated MSSA infection including bacteremia, Lumbosacral spinal epidural abscess, paraspinal/large gluteal/pelvic abscess with sciatica impingement. s/p I&D large gluteal abscess and neuroplasty of sciatica nerve 9/7. s/p CT aspiration right paraspinal abscess. PMH includes DM, HTN, morbid obesity, OSA. 9/25 s/p I &D of post knee/abscess I&D with wound vac placement    PT Comments    Pt very frustrated in this current set back in this hospital stay and the amount of pain in his R LE with movement. Pt screaming and cursing regarding the R LE pain with movement. Pt was ambulating a few days ago however pt only able to tolerate sitting EOB today. Attempted to stand x 2 however unable to today. Pt given HEP for R LE. Suspect once pain under control pt will progress quickly with mobility. Acute PT to continue to follow.  Follow Up Recommendations  CIR     Equipment Recommendations  Rolling walker with 5" wheels;3in1 (PT)    Recommendations for Other Services       Precautions / Restrictions Precautions Precautions: Fall Precaution Comments: wound vac to R posterior thigh/behind knee Restrictions Weight Bearing Restrictions: Yes RLE Weight Bearing: Weight bearing as tolerated    Mobility  Bed Mobility Overal bed mobility: Needs Assistance Bed Mobility: Supine to Sit;Sit to Supine       Sit to supine: Mod assist;+2 for physical assistance Sit to sidelying: Mod assist;+2 for physical assistance General bed mobility comments: increased time, modA for R LE management, significant pain, modA for trunk elevation  Transfers                 General transfer comment: attempted x 2 however pt unable to tolerate due to pain  Ambulation/Gait                 Stairs             Wheelchair Mobility    Modified Rankin (Stroke Patients Only)       Balance Overall balance assessment: Needs assistance Sitting-balance support: Single extremity supported;Feet supported Sitting balance-Leahy Scale: Fair Sitting balance - Comments: needed L UE to lean on due to pain R hip Postural control: Left lateral lean                                  Cognition Arousal/Alertness: Awake/alert Behavior During Therapy: WFL for tasks assessed/performed Overall Cognitive Status: Within Functional Limits for tasks assessed                                 General Comments: pt screaming and cursing regarding pain with mobility but offered apology to PT and tech and appreciated our services      Exercises General Exercises - Lower Extremity Ankle Circles/Pumps: Right;10 reps;Supine Quad Sets: Right;15 reps;Supine    General Comments General comments (skin integrity, edema, etc.): pt with R LE wound vac      Pertinent Vitals/Pain Pain Assessment: Faces Faces Pain Scale: Hurts worst Pain Location: RLE; especially when lifted or touched Pain Descriptors / Indicators: Grimacing;Guarding;Sore;Discomfort;Pressure Pain Intervention(s): Limited activity within patient's tolerance    Home Living  Prior Function            PT Goals (current goals can now be found in the care plan section) Progress towards PT goals: Not progressing toward goals - comment(went back to OR yesterday)    Frequency    Min 3X/week      PT Plan Current plan remains appropriate    Co-evaluation              AM-PAC PT "6 Clicks" Mobility   Outcome Measure  Help needed turning from your back to your side while in a flat bed without using bedrails?: A Little Help needed moving from lying on your back to sitting on the side of a flat bed without using bedrails?: A Little Help needed moving to  and from a bed to a chair (including a wheelchair)?: A Lot Help needed standing up from a chair using your arms (e.g., wheelchair or bedside chair)?: A Lot Help needed to walk in hospital room?: A Lot Help needed climbing 3-5 steps with a railing? : Total 6 Click Score: 13    End of Session   Activity Tolerance: Patient limited by pain Patient left: in bed;with call bell/phone within reach Nurse Communication: Mobility status PT Visit Diagnosis: Other abnormalities of gait and mobility (R26.89);Muscle weakness (generalized) (M62.81);Pain;Difficulty in walking, not elsewhere classified (R26.2) Pain - Right/Left: Right     Time: [id number] PT Time Calculation (min) (ACUTE ONLY): 26 min  Charges:  $Therapeutic Exercise: 8-22 mins $Therapeutic Activity: 8-22 mins                     Kittie Plater, PT, DPT Acute Rehabilitation Services Pager #: [phone] Office #: [phone]    Berline Lopes 07/28/2019, 3:32 PM  [id number]

## 2019-07-28 NOTE — Progress Notes (Signed)
[name]  PROGRESS NOTE  Bradley KOPECKY [id number] DOB: 08-08-1971   PCP: Simona Huh, NP  Patient is from: Home  DOA: 07/05/2019 LOS: 64  Brief Narrative / Interim history: 48 year old male with history of obesity, asthma, hypertension, GERD, nephrolithiasis, diabetes mellitus who presented to ED on 07/06/2019 with low back pain radiating to the right gluteus/thigh with progressive decline in the right lower extremity strength, difficulty ambulation over 2 to 3 weeks.  On presentation he was found to be febrile, tachycardic, tachypneic.  Blood glucose was elevated with increased anion gap.  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  Patient was started on broad-spectrum antibiotics, insulin for DKA.  Neurosurgery, ID consulted.  Patient underwent IR guided his paraspinal abscess drainage.  Blood cultures now growing MSSA. Dr. Doreatha Martin removed 500 mL from a large right-sided gluteal abscess, then performed a neuroplasty of the right sciatic nerve on 07/09/2019. Currently on cefazolin.  Patient underwent TTE on 07/07/2019 followed by TEE on 07/11/2019 which showed no vegetations and normal ejection fraction and no thrombus.  Patient had episode of nausea and vomiting the night of 9/9.  Vomiting was nonbilious.  KUB obtained and revealed marked gaseous dilation of stomach and nonobstructive gas pattern and small and large bowel concerning for ileus. NG tube was placed for decompression.  However, NG tube came out the next morning.  Patient has not had further emesis.  Passing gas but has not has bowel movement.  Repeat abdominal x-ray on 07/15/2019 shows resolved ileus.   07/08/19 -incision and drainage of paraspinal abscess 07/09/19 - neuroplasty of the right sciatic nerve by neurosurgery 9/9 -TEE  07/16/2019 is right hip/buttock wound started oozing significantly. Dr. Doreatha Martin and orthopedic team was contacted.   Initially his wound was thought to be nonpurulent.    07/17/2019 and it became purulent and Ortho took him to the OR and did another I&D of right gluteal abscess with drainage of 300 cc purulent fluid.    07/20/19 - Repeat irrigation and debridement right hip and application of wound VAC   07/27/19  -posterior knee/abscess incision and drainage-wound VAC placement  Postoperatively, PT OT assessed him and they recommended CIR however CIR thought that he was not appropriate so that was denied.  He does not have any insurance to go for SNF.  Only option left for him is to go to home with home health but he does not feel he is ready to go home since he is not able to walk without help yet.   Subjective: The patient was seen and examined this morning, stable no acute distress awake alert, eager to tell me his whole history again. He tolerated procedure yesterday well 07/27/2019 posterior knee abscess incision and drainage and wound VAC placement. He still complaining of pain and difficulty ambulating without assist. Denies any fever or chills.   Assessment & Plan: Sepsis/MSSA bacteremia/L3-S1 epidural abscess/right iliopsoas and gluteal abscess/right lower extremity cellulitis -Remained stable this morning, afebrile normotensive, status post incision and drainage of posterior right knee on 07/27/2019, tolerated procedure well.  -Lower extremity Doppler negative for DVT. -Currently on cefazolin per ID.  Plan is 6 to 8 weeks of IV Ancef.  -Blood and tissue cultures on 9/4-growing MSSA. Repeat blood culture on 9/7 negative so far. -TTE and TEE did not show any vegetation or any thrombus.    -PICC line 9/12. -PT/OT evaluated and they recommended CIR Status post  another I&D of the right gluteal abscess with drainage of 300 cc purulent fluid on 07/17/2019.  Repeat irrigation and debridement right hip and application of wound VAC on 07/20/2019.    -Titrate pain medication for better pain control   Ileus/constipation/possible gastric outlet obstruction versus possible gastroparesis -Resolved reporting of good bowel movements and bowel sounds  Uncontrolled NIDDM-2/DKA:  New onset diabetes mellitus type 2 DKA resolved. Hemoglobin A1c of 12.6.   Blood sugar control.  Continue current dose of pre-meal regimen, SSI and twice daily insulin NPH.  Hyponatremia: Mild and stable.  Check daily labs.  AKI:  Resolved   Hypokalemia:  Resolved.  Hypertension:   Normotensive. -Continue hydralazine scheduled and as needed.  GERD: Continue PPI  Fatty liver:  Check lipid profile.   May benefit from statins.    Morbid obesity:  BMI 42.6 Diet and exercise Further management on outpatient basis  Bilateral lower extremity edema: No known history of CHF.  Likely dependent since he is not able to walk much.  We will give IV Lasix 40 mg daily starting today for total of 3 days.   DVT prophylaxis: Subcu Lovenox Code Status: Full code Family Communication: None present at bedside.  Plan of care discussed with patient. Disposition Plan: Remains inpatient pending clearance by orthopedics.  Does not meet criteria for CIR now.  Does not have insurance so SNF might be difficult option.  Considering home health care. Consultants: Ortho trauma, infectious disease   Objective: Vitals:   07/27/19 2040 07/28/19 0018 07/28/19 0431 07/28/19 0722  BP: 110/74 100/65 118/72 111/70  Pulse: (!) 112  (!) 110   Resp: 17 19 19    Temp: 97.6 F (36.4 C) 99 F (37.2 C) 98.8 F (37.1 C) 98.3 F (36.8 C)  TempSrc: Oral Oral Oral Oral  SpO2: 100%     Weight:      Height:        Intake/Output Summary (Last 24 hours) at 07/28/2019 1139 Last data filed at 07/28/2019 0948 Gross per 24 hour  Intake 1921.17 ml  Output 4050 ml  Net -2128.83 ml   Filed Weights   07/05/19 1807 07/06/19 2116 07/27/19 1113  Weight: (!) 167.4 kg (!) 164 kg (!) 164 kg    Examination: BP 111/70 (BP Location: Left Arm)    Pulse (!) 110   Temp 98.3 F (36.8 C) (Oral)   Resp 19   Ht 6\' 6"  (1.981 m)   Wt (!) 164 kg   SpO2 100%   BMI 41.78 kg/m    Physical Exam  Constitution:  Alert, cooperative, no distress,  Psychiatric: Normal and stable mood and affect, cognition intact,   HEENT: Normocephalic, PERRL, otherwise with in Normal limits  Chest:Chest symmetric Cardio vascular:  S1/S2, RRR, No murmure, No Rubs or Gallops  pulmonary: Clear to auscultation bilaterally, respirations unlabored, negative wheezes / crackles Abdomen: Soft, non-tender, non-distended, bowel sounds,no masses, no organomegaly Muscular skeletal: Limited exam - in bed, able to move all 4 extremities,  Neuro: CNII-XII intact. , normal motor and sensation, reflexes intact  Extremities: No pitting edema lower extremities, +2 pulses  Skin: Dry, warm to touch, negative for any Rashes, right hip surgical wound, with staples, right posterior knee surgical open wound wound VAC and dressing Wounds: Right leg hip surgical wound with staples, right posterior knee/leg wound with a wound VAC in place   .   Procedures:  9/6-incision and drainage of paraspinal abscess 07/09/2019 neuroplasty of the right sciatic nerve by neurosurgery 9/9-TEE  Incision and drainage of right gluteal abscess on 07/17/2019 Irrigation and debridement of right hip on 07/20/2019 07/27/2019 -posterior knee/abscess incision and drainage-wound VAC placement  Microbiology summarized: 9/4-SARS-CoV-2 screen negative 9/4-blood and tissue cultures-MSSA 9/7-blood cultures negative.  Antimicrobials: Anti-infectives (From admission, onward)   Start     Dose/Rate Route Frequency Ordered Stop   07/17/19 1857  gentamicin (GARAMYCIN) injection  Status:  Discontinued       As needed 07/17/19 1857 07/17/19 2009   07/17/19 1857  vancomycin (VANCOCIN) powder  Status:  Discontinued       As needed 07/17/19 1858 07/17/19 2009   07/10/19 1600  metroNIDAZOLE (FLAGYL) IVPB 500 mg  Status:   Discontinued     500 mg 100 mL/hr over 60 Minutes Intravenous Every 8 hours 07/10/19 1549 07/13/19 0939   07/09/19 0839  vancomycin (VANCOCIN) powder  Status:  Discontinued       As needed 07/09/19 0839 07/09/19 0935   07/09/19 0839  tobramycin (NEBCIN) powder  Status:  Discontinued       As needed 07/09/19 0840 07/09/19 0935   07/07/19 1830  ceFAZolin (ANCEF) IVPB 2g/100 mL premix     2 g 200 mL/hr over 30 Minutes Intravenous Every 8 hours 07/07/19 1333     07/07/19 0900  ceFAZolin (ANCEF) IVPB 2g/100 mL premix  Status:  Discontinued     2 g 200 mL/hr over 30 Minutes Intravenous Every 8 hours 07/07/19 0847 07/07/19 1333   07/06/19 2030  clindamycin (CLEOCIN) IVPB 600 mg  Status:  Discontinued     600 mg 100 mL/hr over 30 Minutes Intravenous Every 8 hours 07/06/19 2010 07/07/19 0847   07/06/19 1800  vancomycin (VANCOCIN) 1,500 mg in sodium chloride 0.9 % 500 mL IVPB  Status:  Discontinued     1,500 mg 250 mL/hr over 120 Minutes Intravenous Every 12 hours 07/06/19 0820 07/07/19 0847   07/06/19 1400  ceFEPIme (MAXIPIME) 2 g in sodium chloride 0.9 % 100 mL IVPB  Status:  Discontinued     2 g 200 mL/hr over 30 Minutes Intravenous Every 8 hours 07/06/19 0504 07/07/19 0847   07/06/19 1400  vancomycin (VANCOCIN) 2,000 mg in sodium chloride 0.9 % 500 mL IVPB  Status:  Discontinued     2,000 mg 250 mL/hr over 120 Minutes Intravenous Every 8 hours 07/06/19 0504 07/06/19 0818   07/06/19 0745  clindamycin (CLEOCIN) IVPB 600 mg  Status:  Discontinued     600 mg 100 mL/hr over 30 Minutes Intravenous  Once 07/06/19 0737 07/06/19 2029   07/06/19 0500  ceFEPIme (MAXIPIME) 2 g in sodium chloride 0.9 % 100 mL IVPB     2 g 200 mL/hr over 30 Minutes Intravenous  Once 07/06/19 0451 07/06/19 1335   07/06/19 0500  vancomycin (VANCOCIN) 2,500 mg in sodium chloride 0.9 % 500 mL IVPB     2,500 mg 250 mL/hr over 120 Minutes Intravenous  Once 07/06/19 0451 07/06/19 1335      Sch Meds:  Scheduled Meds: .  acetaminophen  500 mg Oral Q12H   Or  . acetaminophen  650 mg Rectal Q12H  . Chlorhexidine Gluconate Cloth  6 each Topical Daily  . enoxaparin (LOVENOX) injection  0.5 mg/kg Subcutaneous Q24H  . gabapentin  300 mg Oral TID  . hydrALAZINE  25 mg Oral BID  . influenza vac split quadrivalent PF  0.5 mL Intramuscular Tomorrow-1000  . insulin aspart  0-15 Units Subcutaneous TID WC  . insulin aspart  0-5 Units Subcutaneous  QHS  . insulin aspart  8 Units Subcutaneous TID WC  . insulin NPH Human  28 Units Subcutaneous BID AC & HS  . methocarbamol  750 mg Oral TID  . metoCLOPramide  10 mg Oral TID AC & HS  . polyethylene glycol  17 g Oral BID  . senna-docusate  1 tablet Oral BID  . sodium chloride flush  10-40 mL Intracatheter Q12H   Continuous Infusions: .  ceFAZolin (ANCEF) IV 2 g (07/28/19 0932)  . lactated ringers 10 mL/hr at 07/27/19 1115   PRN Meds:.albuterol, fentaNYL (SUBLIMAZE) injection, hydrALAZINE, HYDROcodone-acetaminophen, ondansetron **OR** ondansetron (ZOFRAN) IV, sodium chloride flush, zolpidem   I have personally reviewed the following labs and images: CBC: Recent Labs  Lab 07/24/19 0325 07/25/19 0500 07/26/19 0410 07/27/19 0358 07/28/19 0553  WBC 10.0 8.9 10.6* 9.7 8.8  NEUTROABS 7.6 6.6 8.0* 7.2 6.1  HGB 9.6* 9.2* 9.3* 8.9* 7.3*  HCT 30.5* 29.1* 30.2* 28.4* 22.6*  MCV 95.9 95.4 96.5 95.0 93.8  PLT 276 251 267 248 263   BMP &GFR Recent Labs  Lab 07/23/19 0800 07/24/19 0325 07/25/19 0500 07/28/19 0553  NA 135 134* 133* 130*  K 4.1 3.8 3.8 4.1  CL 94* 93* 94* 91*  CO2 32 31 30 30   GLUCOSE 187* 102* 144* 161*  BUN <5* 5* <5* 5*  CREATININE 0.49* 0.52* 0.49* 0.60*  CALCIUM 8.8* 8.5* 8.7* 8.4*  Diabetic: No results for input(s): HGBA1C in the last 72 hours. Recent Labs  Lab 07/27/19 1040 07/27/19 1337 07/27/19 1602 07/27/19 2103 07/28/19 0719  GLUCAP 129* 136* 179* 192* 158*   Cardiac E Lipid Profile: Recent Labs    07/28/19 0553  CHOL 108   HDL 18*  LDLCALC 64  TRIG 130  CHOLHDL 6.0   Anemia Panel: No results for input(s): VITAMINB12, FOLATE, FERRITIN, TIBC, IRON, RETICCTPCT in the last 72 hours. Urine analysis:    Component Value Date/Time   COLORURINE YELLOW 07/06/2019 [place] 07/06/2019 0437   LABSPEC 1.024 07/06/2019 0437   PHURINE 5.0 07/06/2019 0437   GLUCOSEU >=500 (A) 07/06/2019 0437   HGBUR MODERATE (A) 07/06/2019 0437   BILIRUBINUR NEGATIVE 07/06/2019 0437   KETONESUR 80 (A) 07/06/2019 0437   PROTEINUR NEGATIVE 07/06/2019 0437   NITRITE NEGATIVE 07/06/2019 0437   LEUKOCYTESUR NEGATIVE 07/06/2019 0437   Sepsis Labs: Invalid input(s): PROCALCITONIN, [place]  Microbiology: Recent Results (from the past 240 hour(s))  Aerobic/Anaerobic Culture (surgical/deep wound)     Status: None (Preliminary result)   Collection Time: 07/27/19 12:56 PM   Specimen: PATH Other; Tissue  Result Value Ref Range Status   Specimen Description ABSCESS RIGHT THIGH  Final   Special Requests NONE  Final   Gram Stain NO WBC SEEN NO ORGANISMS SEEN   Final   Culture   Final    FEW STAPHYLOCOCCUS AUREUS SUSCEPTIBILITIES TO FOLLOW Performed at [facility], [place] [address], [place], [place] [address]    Report Status PENDING  Incomplete    Radiology Studies: No results found.  Total time spent 35  minutes Skipper Cliche, M.D. [facility]  If 7PM-7AM, please contact night-coverage [email or web address] Password [facility] 07/28/2019, 11:39 AM

## 2019-07-29 DIAGNOSIS — I82409 Acute embolism and thrombosis of unspecified deep veins of unspecified lower extremity: Secondary | ICD-10-CM | POA: Diagnosis not present

## 2019-07-29 LAB — CBC WITH DIFFERENTIAL/PLATELET
Abs Immature Granulocytes: 0.03 10*3/uL (ref 0.00–0.07)
Basophils Absolute: 0 10*3/uL (ref 0.0–0.1)
Basophils Relative: 1 %
Eosinophils Absolute: 0.3 10*3/uL (ref 0.0–0.5)
Eosinophils Relative: 5 %
HCT: 20.3 % — ABNORMAL LOW (ref 39.0–52.0)
Hemoglobin: 6.7 g/dL — CL (ref 13.0–17.0)
Immature Granulocytes: 1 %
Lymphocytes Relative: 23 %
Lymphs Abs: 1.5 10*3/uL (ref 0.7–4.0)
MCH: 30.9 pg (ref 26.0–34.0)
MCHC: 33 g/dL (ref 30.0–36.0)
MCV: 93.5 fL (ref 80.0–100.0)
Monocytes Absolute: 0.6 10*3/uL (ref 0.1–1.0)
Monocytes Relative: 9 %
Neutro Abs: 4 10*3/uL (ref 1.7–7.7)
Neutrophils Relative %: 61 %
Platelets: 267 10*3/uL (ref 150–400)
RBC: 2.17 MIL/uL — ABNORMAL LOW (ref 4.22–5.81)
RDW: 14.1 % (ref 11.5–15.5)
WBC: 6.4 10*3/uL (ref 4.0–10.5)
nRBC: 0 % (ref 0.0–0.2)

## 2019-07-29 LAB — BASIC METABOLIC PANEL
Anion gap: 12 (ref 5–15)
BUN: <5 mg/dL — ABNORMAL LOW (ref 6–20)
CO2: 31 mmol/L (ref 22–32)
Calcium: 8.6 mg/dL — ABNORMAL LOW (ref 8.9–10.3)
Chloride: 91 mmol/L — ABNORMAL LOW (ref 98–111)
Creatinine, Ser: 0.49 mg/dL — ABNORMAL LOW (ref 0.61–1.24)
GFR calc Af Amer: >60 mL/min (ref >60)
GFR calc non Af Amer: >60 mL/min (ref >60)
Glucose, Bld: 109 mg/dL — ABNORMAL HIGH (ref 70–99)
Potassium: 3.6 mmol/L (ref 3.5–5.1)
Sodium: 134 mmol/L — ABNORMAL LOW (ref 135–145)

## 2019-07-29 LAB — GLUCOSE, CAPILLARY
Glucose-Capillary: 102 mg/dL — ABNORMAL HIGH (ref 70–99)
Glucose-Capillary: 130 mg/dL — ABNORMAL HIGH (ref 70–99)
Glucose-Capillary: 149 mg/dL — ABNORMAL HIGH (ref 70–99)
Glucose-Capillary: 140 mg/dL — ABNORMAL HIGH (ref 70–99)

## 2019-07-29 LAB — HEMOGLOBIN AND HEMATOCRIT, BLOOD
HCT: 22.1 % — ABNORMAL LOW (ref 39.0–52.0)
Hemoglobin: 7.2 g/dL — ABNORMAL LOW (ref 13.0–17.0)

## 2019-07-29 LAB — PREPARE RBC (CROSSMATCH)

## 2019-07-29 LAB — ABO/RH: ABO/RH(D): A POS

## 2019-07-29 MED ORDER — OXYCODONE HCL 5 MG PO TABS
5.0000 mg | ORAL_TABLET | ORAL | Status: DC | PRN
  Administered 2019-07-29: 5 mg via ORAL
  Filled 2019-07-29: qty 1

## 2019-07-29 MED ORDER — OXYCODONE HCL 5 MG PO TABS
7.5000 mg | ORAL_TABLET | ORAL | Status: DC | PRN
  Administered 2019-07-29 – 2019-08-01 (×12): 7.5 mg via ORAL
  Filled 2019-07-29 (×12): qty 2

## 2019-07-29 MED ORDER — POLYSACCHARIDE IRON COMPLEX 150 MG PO CAPS
150.0000 mg | ORAL_CAPSULE | Freq: Every day | ORAL | Status: DC
  Administered 2019-07-30 – 2019-08-16 (×17): 150 mg via ORAL
  Filled 2019-07-29 (×19): qty 1

## 2019-07-29 MED ORDER — SODIUM CHLORIDE 0.9% IV SOLUTION: Freq: Once | INTRAVENOUS | Status: DC

## 2019-07-29 MED ORDER — ACETAMINOPHEN 500 MG PO TABS
1000.0000 mg | ORAL_TABLET | Freq: Three times a day (TID) | ORAL | Status: DC
  Administered 2019-07-29 – 2019-08-16 (×54): 1000 mg via ORAL
  Filled 2019-07-29 (×54): qty 2

## 2019-07-29 NOTE — Progress Notes (Signed)
PROGRESS NOTE    Bradley Cox  [id number] DOB: February 08, 1971 DOA: 07/05/2019 PCP: Simona Huh, NP      Brief Narrative:  Bradley Cox is a 48 y.o. M with hx HTN, DM, and asthma who presented with 3 weeks right flank pain after a Toradol shot.  In the ER, he was febrile, tachycardic, lactic acid elevated.  CT and MRI of the lumbar spine and right leg showed epidural and iliopsoas abscesses.  Neurosurgery recommended nonoperative treatment and he was admitted for antibiotics and IV insulin for DKA.      Interim History: Patient admitted and underwent IR guided paraspinal abscess drainage.   Blood cultures subsequently grew MSSA. TTE and then TEE showed no vegetations. Patient underwent surgical debrdiment of gluteal abscess and neuroplasty of right sciatic nerve. Underwent two subsequent I&Ds of the buttock wound as well as a posterior knee wound with wound VAC placement.   Assessment & Plan:  Sepsis from MSSA bacteremia L3-S1 paraspinal abscess Right iliopsoas abscess and gluteal abcess Right posterior thigh abscess  Presented with tachcyardia, fever, abscess and elevated lactate.  Blood cultures grew MSSA, now cleared.  IR ortho and ID consulted.  Has undergone aspiration of paraspinal abscess and serial debridement with now wound vac.  TEE negative.  ID recommend 4 weeks of antibiotics (four weeks from OR debridement would be 10/5) and then reassessment in their office for continued antibiotics.  Has PICC in place. -Continue cefazolin  -PT mobilization -Maintain wound VAC, appreciate WOC care   Acute blood loss anemia Hgb 12 on admission, slowly trending down during hospitalization.  No clinical bleeding other than serosang drainage in VAC. -Transfuse 1 unit now -Start iron  DVT, distal [place] on 9/5 negative.  Repeat US 9/26 showed acute DVT in tibial veins, distal.  Given his blood loss, possible need for further debridements, close monitoring, I will defer treatment for  now. -Continue therapeutic dose Lovenox -Repeat US on Wednesday  Diabetes Glucoses well controlled -Continue SSI, mealtime insulin -Continue NPH -Continue gabapentin -Hold home metformin  Hypertension BP low normal -Continue hydralazine  Asthma No clinically active bronchospasm  Hyponatremia Mild, stable  AKI Resolved  Hypokalemia Resolved  Constipation vs ileus vs gastroparesis Had prolonged difficulty advancing diet.  Now improved.     MDM and disposition: The below labs and imaging reports were reviewed and summarized above.  Medication management as above.  The patient was admitted with MSSA bacteremia and multifocal soft tissue infections.  He has undergone serial debridemnts and placement of wound vac.    He continues to require IV antibiotics, further in hospital operative debridement.     DVT prophylaxis: Lovenox Code Status: FULL Family Communication:     Consultants:   Orthopedics  Infectious disease  Interventional radiology  Neurosurgery  Procedures:   9/4 CT guided aspiration of right paraspinal abscess  9/5 TTE -- normal  9/5 Right LE doppler US -- no clot  9/7 I&D of right hip/pelvis abscess and Neuroplasty by Dr Doreatha Martin   9/9 TEE -- no veg  9/15 Repeat I&D of right hip abscess and application of wound Vac by Dr. Marcelino Scot  9/18 Repeat repeat I&D of right hip abscess and wound vac by Dr. Marcelino Scot  9/25 I&D of posterior right thigh abscess with wound vac by Dr. Doreatha Martin  9/25 Right LE doppler US -- showed acute posterior tib vein thrombosis  Antimicrobials:   Vancomycin 9/3 >> 9/7  Cefepime 9/3 >> 9/4  Clindamycin x1 9/4  Cefazolin 9/5 >>                              Tobramycin x1 9/7                                                        Flagyl 9/8 >> 9/11                                                                                       Gentamicin x1 9/15   Culture data:   8/17 (prior to this  admission) urine culture: MSSA  9/4 blood cultures x2: 2/2 positive for MSSA  9/4 urine culture: multiple species  9/4 paraspinal abscess aspirate: MSSA, fungus culture negative  9/7 surgical deep right hip culture: MSSA  9/7 blood culture x2: NG  9/25 right thigh abscess intraoperative: few MSSA     Subjective: Pain in right leg.  No vomiting.  Appetite good.  No fever.  No confusion.  Objective: Vitals:   07/29/19 0400 07/29/19 0500 07/29/19 0600 07/29/19 0733  BP:    111/68  Pulse: 92 93 95 100  Resp: 20 18 20 16   Temp:    99.2 F (37.3 C)  TempSrc:    Oral  SpO2: 94% 93% 95% 94%  Weight:      Height:        Intake/Output Summary (Last 24 hours) at 07/29/2019 0858 Last data filed at 07/29/2019 0600 Gross per 24 hour  Intake 1326.91 ml  Output 8825 ml  Net -7498.09 ml   Filed Weights   07/05/19 1807 07/06/19 2116 07/27/19 1113  Weight: (!) 167.4 kg (!) 164 kg (!) 164 kg    Examination: General appearance:  adult male, alert and in no acute distress.   HEENT: Anicteric, conjunctiva pink, lids and lashes normal. No nasal deformity, discharge, epistaxis.  Lips moist, dentition good, no oral lesions, hearing normal.   Skin: Warm and dry.  Pale.  No jaundice.  No suspicious rashes or lesions. Cardiac: RRR, nl S1-S2, no murmurs appreciated.  Capillary refill is brisk.  JVP not visible.  Right leg LE edema more than left.  Radial pulses 2+ and symmetric. Respiratory: Normal respiratory rate and rhythm.  CTAB without rales or wheezes. Abdomen: Abdomen soft.  no TTP. No ascites, distension, hepatosplenomegaly.   MSK: No deformities or effusions. Neuro: Awake and alert.  EOMI, moves all extremities. Speech fluent.    Psych: Sensorium intact and responding to questions, attention normal. Affect noraml.  Judgment and insight appear normal.    Data Reviewed: I have personally reviewed following labs and imaging studies:  CBC: Recent Labs  Lab 07/25/19 0500 07/26/19  0410 07/27/19 0358 07/28/19 0553 07/29/19 0644  WBC 8.9 10.6* 9.7 8.8 6.4  NEUTROABS 6.6 8.0* 7.2 6.1 4.0  HGB 9.2* 9.3* 8.9* 7.3* 6.7*  HCT 29.1* 30.2* 28.4* 22.6* 20.3*  MCV 95.4 96.5 95.0 93.8 93.5  PLT  251 267 248 263 [id number]   Basic Metabolic Panel: Recent Labs  Lab 07/23/19 0800 07/24/19 0325 07/25/19 0500 07/28/19 0553 07/29/19 0644  NA 135 134* 133* 130* 134*  K 4.1 3.8 3.8 4.1 3.6  CL 94* 93* 94* 91* 91*  CO2 32 31 30 30 31   GLUCOSE 187* 102* 144* 161* 109*  BUN <5* 5* <5* 5* <5*  CREATININE 0.49* 0.52* 0.49* 0.60* 0.49*  CALCIUM 8.8* 8.5* 8.7* 8.4* 8.6*   GFR: Estimated Creatinine Clearance: 188.1 mL/min (A) (by C-G formula based on SCr of 0.49 mg/dL (L)). Liver Function Tests: No results for input(s): AST, ALT, ALKPHOS, BILITOT, PROT, ALBUMIN in the last 168 hours. No results for input(s): LIPASE, AMYLASE in the last 168 hours. No results for input(s): AMMONIA in the last 168 hours. Coagulation Profile: No results for input(s): INR, PROTIME in the last 168 hours. Cardiac Enzymes: No results for input(s): CKTOTAL, CKMB, CKMBINDEX, TROPONINI in the last 168 hours. BNP (last 3 results) No results for input(s): PROBNP in the last 8760 hours. HbA1C: No results for input(s): HGBA1C in the last 72 hours. CBG: Recent Labs  Lab 07/28/19 0719 07/28/19 1141 07/28/19 1555 07/28/19 2129 07/29/19 0731  GLUCAP 158* 207* 182* 113* 102*   Lipid Profile: Recent Labs    07/28/19 0553  CHOL 108  HDL 18*  LDLCALC 64  TRIG 130  CHOLHDL 6.0   Thyroid Function Tests: No results for input(s): TSH, T4TOTAL, FREET4, T3FREE, THYROIDAB in the last 72 hours. Anemia Panel: No results for input(s): VITAMINB12, FOLATE, FERRITIN, TIBC, IRON, RETICCTPCT in the last 72 hours. Urine analysis:    Component Value Date/Time   COLORURINE YELLOW 07/06/2019 [place] 07/06/2019 0437   LABSPEC 1.024 07/06/2019 0437   PHURINE 5.0 07/06/2019 0437   GLUCOSEU >=500  (A) 07/06/2019 0437   HGBUR MODERATE (A) 07/06/2019 0437   BILIRUBINUR NEGATIVE 07/06/2019 0437   KETONESUR 80 (A) 07/06/2019 0437   PROTEINUR NEGATIVE 07/06/2019 0437   NITRITE NEGATIVE 07/06/2019 0437   LEUKOCYTESUR NEGATIVE 07/06/2019 0437   Sepsis Labs: @LABRCNTIP (procalcitonin:4,lacticacidven:4)  ) Recent Results (from the past 240 hour(s))  Aerobic/Anaerobic Culture (surgical/deep wound)     Status: None (Preliminary result)   Collection Time: 07/27/19 12:56 PM   Specimen: PATH Other; Tissue  Result Value Ref Range Status   Specimen Description ABSCESS RIGHT THIGH  Final   Special Requests NONE  Final   Gram Stain NO WBC SEEN NO ORGANISMS SEEN   Final   Culture   Final    FEW STAPHYLOCOCCUS AUREUS SUSCEPTIBILITIES TO FOLLOW Performed at [facility], [place] [address], [place], [place] [address]    Report Status PENDING  Incomplete         Radiology Studies: No results found.      Scheduled Meds: . acetaminophen  500 mg Oral Q12H   Or  . acetaminophen  650 mg Rectal Q12H  . Chlorhexidine Gluconate Cloth  6 each Topical Daily  . enoxaparin (LOVENOX) injection  0.5 mg/kg Subcutaneous Q24H  . gabapentin  300 mg Oral TID  . hydrALAZINE  25 mg Oral BID  . influenza vac split quadrivalent PF  0.5 mL Intramuscular Tomorrow-1000  . insulin aspart  0-15 Units Subcutaneous TID WC  . insulin aspart  0-5 Units Subcutaneous QHS  . insulin aspart  8 Units Subcutaneous TID WC  . insulin NPH Human  28 Units Subcutaneous BID AC & HS  . methocarbamol  750 mg Oral TID  .  metoCLOPramide  10 mg Oral TID AC & HS  . polyethylene glycol  17 g Oral BID  . senna-docusate  1 tablet Oral BID  . sodium chloride flush  10-40 mL Intracatheter Q12H   Continuous Infusions: .  ceFAZolin (ANCEF) IV 2 g (07/29/19 0144)  . lactated ringers Stopped (07/28/19 1740)     LOS: 23 days    Time spent: 35 mimutes    Edwin Dada, MD [facility] 07/29/2019, 8:58 AM      Please page through [place]:  [email or web address] Password [email or web address] If 7PM-7AM, please contact night-coverage

## 2019-07-29 NOTE — Progress Notes (Signed)
Dr. Griffin Basil called and advised patient's wound vac is alarming blockage.  Per Dr. Griffin Basil, can connect and  hook wound vac up to wall suction to 125 until primary able to assess in am.

## 2019-07-29 NOTE — Progress Notes (Signed)
SPORTS MEDICINE AND JOINT REPLACEMENT  Lara Mulch, MD    Carlyon Shadow, PA-C [address], [place], [place]  [address]                             [phone]   PROGRESS NOTE  Subjective:  negative for Chest Pain  negative for Shortness of Breath  negative for Nausea/Vomiting   negative for Calf Pain  negative for Bowel Movement   Tolerating Diet: yes         Patient reports pain as 3 on 0-10 scale.    Objective: Vital signs in last 24 hours:    Patient Vitals for the past 24 hrs:  BP Temp Temp src Pulse Resp SpO2  07/29/19 0733 111/68 - - 100 16 94 %  07/29/19 0600 - - - 95 20 95 %  07/29/19 0500 - - - 93 18 93 %  07/29/19 0400 - - - 92 20 94 %  07/29/19 0337 108/71 98.1 F (36.7 C) Oral 92 17 -  07/29/19 0300 - - - 94 (!) 21 93 %  07/29/19 0200 - - - 93 18 92 %  07/29/19 0105 - - - 100 18 95 %  07/29/19 0100 - - - 93 17 92 %  07/29/19 0000 - - - 98 18 96 %  07/28/19 2346 - - - 99 17 95 %  07/28/19 2329 110/73 98.8 F (37.1 C) Oral - 20 95 %  07/28/19 2300 - - - 94 (!) 23 97 %  07/28/19 2200 - - - 95 19 92 %  07/28/19 2100 - - - 98 20 94 %  07/28/19 2000 - - - 98 - 96 %  07/28/19 1921 114/73 98.4 F (36.9 C) Oral - - -  07/28/19 1900 - - - 99 16 95 %  07/28/19 1552 116/66 99.6 F (37.6 C) Oral - - -  07/28/19 1236 118/67 - - (!) 102 - 92 %    @flow {1959:LAST@   Intake/Output from previous day:   09/26 0701 - 09/27 0700 In: 1566.9 [P.O.:1076; I.V.:226.8] Out: [id number] [Urine:8150; Drains:925]   Intake/Output this shift:   No intake/output data recorded.   Intake/Output      09/26 0701 - 09/27 0700 09/27 0701 - 09/28 0700   P.O. 1076    I.V. (mL/kg) 226.8 (1.4)    IV Piggyback 264.1    Total Intake(mL/kg) 1566.9 (9.6)    Urine (mL/kg/hr) 8150 (2.1)    Drains 925    Blood     Total Output 9075    Net -7508.1            LABORATORY DATA: Recent Labs    07/23/19 0500 07/24/19 0325 07/25/19 0500 07/26/19 0410 07/27/19 0358  07/28/19 0553 07/29/19 0644  WBC 9.4 10.0 8.9 10.6* 9.7 8.8 6.4  HGB 9.3* 9.6* 9.2* 9.3* 8.9* 7.3* PENDING  HCT 30.3* 30.5* 29.1* 30.2* 28.4* 22.6* PENDING  PLT 277 276 251 267 248 263 267   Recent Labs    07/23/19 0800 07/24/19 0325 07/25/19 0500 07/28/19 0553 07/29/19 0644  NA 135 134* 133* 130* 134*  K 4.1 3.8 3.8 4.1 3.6  CL 94* 93* 94* 91* 91*  CO2 32 31 30 30 31   BUN <5* 5* <5* 5* <5*  CREATININE 0.49* 0.52* 0.49* 0.60* 0.49*  GLUCOSE 187* 102* 144* 161* 109*  CALCIUM 8.8* 8.5* 8.7* 8.4* 8.6*   No results found for:  INR, PROTIME  Examination:  General appearance: alert, cooperative and no distress Extremities: extremities normal, atraumatic, no cyanosis or edema  Wound Exam: clean, dry, intact    Motor Exam: Quadriceps and Hamstrings Intact  Sensory Exam: Superficial Peroneal, Deep Peroneal and Tibial normal   Assessment:    2 Days Post-Op  Procedure(s) (LRB): IRRIGATION AND DEBRIDEMENT EXTREMITY Right Leg (Right) Application Of Wound Vac (Right)  ADDITIONAL DIAGNOSIS:  Principal Problem:   MSSA bacteremia Active Problems:   Obesity, Class III, BMI 40-49.9 (morbid obesity) (HCC)   Essential hypertension   Sepsis (HCC)   AKI (acute kidney injury) (HCC)   Hyperkalemia   Abscess of right hip   Paraspinal abscess (HCC)   Compression of right sciatic nerve     Plan:  Sepsis/MSSA bacteremia/L3-S1 epidural abscess/right iliopsoas and gluteal abscess/right lower extremity cellulitis -Remained stable this morning, afebrile normotensive, status post incision and drainage of posterior right knee on 07/27/2019, tolerated procedure well. -Currently on meds per ID team -Will continue to follow over weekend with plan to take back to the [place] 07/29/2019, 7:34 AM

## 2019-07-29 NOTE — Anesthesia Preprocedure Evaluation (Addendum)
Anesthesia Evaluation  Patient identified by MRN, date of birth, ID band Patient awake    Reviewed: Allergy & Precautions, NPO status , Patient's Chart, lab work & pertinent test results  Airway Mallampati: II  TM Distance: >3 FB Neck ROM: Full    Dental  (+) Dental Advisory Given, Teeth Intact   Pulmonary asthma , sleep apnea ,    Pulmonary exam normal        Cardiovascular hypertension, Pt. on medications (-) anginaNormal cardiovascular exam  TEE 07/11/19: r/o endocarditis in setting of bacteremia 1. The left ventricle has normal systolic function, with an ejection fraction of 60-65%. There is moderate concentric left ventricular hypertrophy. No evidence of left ventricular regional wall motion abnormalities.  2. The right ventricle has normal systolc function. The cavity was normal. There is no increase in right ventricular wall thickness.  3. Left atrial size was moderately dilated.  4. No evidence of a thrombus present in the left atrial appendage.  5. The aortic root, ascending aorta, aortic arch and descending aorta are normal in size and structure.  6. A linear 11 mm echodensity on the ventricular surface of the anterior leaflet is most likely a redundant chorda tendina and is unlikely to represent vegetation.    Neuro/Psych  Neuromuscular disease negative psych ROS   GI/Hepatic Neg liver ROS, GERD  Controlled,  Endo/Other  diabetes, Type 2, Oral Hypoglycemic AgentsMorbid obesity  Renal/GU negative Renal ROS     Musculoskeletal  (+) Arthritis , R leg abscess   Abdominal (+) + obese,   Peds  Hematology  (+) Blood dyscrasia, anemia ,   Anesthesia Other Findings   Reproductive/Obstetrics                                                             Anesthesia Evaluation  Patient identified by MRN, date of birth, ID band Patient awake    Reviewed: Allergy & Precautions, NPO status ,  Patient's Chart, lab work & pertinent test results  Airway Mallampati: II  TM Distance: >3 FB Neck ROM: Full    Dental  (+) Dental Advisory Given, Teeth Intact   Pulmonary asthma , sleep apnea ,    Pulmonary exam normal breath sounds clear to auscultation       Cardiovascular hypertension, Pt. on medications (-) angina(-) CAD and (-) Past MI  Rhythm:Regular Rate:Tachycardia  TEE 07/11/19: r/o endocarditis in setting of bacteremia 1. The left ventricle has normal systolic function, with an ejection fraction of 60-65%. There is moderate concentric left ventricular hypertrophy. No evidence of left ventricular regional wall motion abnormalities.  2. The right ventricle has normal systolc function. The cavity was normal. There is no increase in right ventricular wall thickness.  3. Left atrial size was moderately dilated.  4. No evidence of a thrombus present in the left atrial appendage.  5. The aortic root, ascending aorta, aortic arch and descending aorta are normal in size and structure.  6. A linear 11 mm echodensity on the ventricular surface of the anterior leaflet is most likely a redundant chorda tendina and is unlikely to represent vegetation.    Neuro/Psych  Neuromuscular disease negative psych ROS   GI/Hepatic Neg liver ROS, GERD  ,  Endo/Other  diabetes, Type 2, Oral Hypoglycemic AgentsMorbid obesity  Renal/GU negative  Renal ROS     Musculoskeletal  (+) Arthritis , R leg abscess   Abdominal   Peds  Hematology  (+) Blood dyscrasia, anemia ,   Anesthesia Other Findings Day of surgery medications reviewed with the patient.  Reproductive/Obstetrics                            Anesthesia Physical Anesthesia Plan  ASA: II  Anesthesia Plan: General   Post-op Pain Management:    Induction: Intravenous  PONV Risk Score and Plan: 2 and Midazolam, Dexamethasone and Ondansetron  Airway Management Planned: Oral ETT  Additional  Equipment:   Intra-op Plan:   Post-operative Plan: Extubation in OR  Informed Consent: I have reviewed the patients History and Physical, chart, labs and discussed the procedure including the risks, benefits and alternatives for the proposed anesthesia with the patient or authorized representative who has indicated his/her understanding and acceptance.     Dental advisory given  Plan Discussed with: CRNA  Anesthesia Plan Comments:        Anesthesia Quick Evaluation  Anesthesia Physical  Anesthesia Plan  ASA: III  Anesthesia Plan: General   Post-op Pain Management:    Induction: Intravenous  PONV Risk Score and Plan: 2 and Midazolam, Dexamethasone, Ondansetron and Treatment may vary due to age or medical condition  Airway Management Planned: Oral ETT  Additional Equipment: None  Intra-op Plan:   Post-operative Plan: Extubation in OR  Informed Consent: I have reviewed the patients History and Physical, chart, labs and discussed the procedure including the risks, benefits and alternatives for the proposed anesthesia with the patient or authorized representative who has indicated his/her understanding and acceptance.     Dental advisory given  Plan Discussed with: CRNA and Anesthesiologist  Anesthesia Plan Comments:        Anesthesia Quick Evaluation

## 2019-07-29 NOTE — Progress Notes (Signed)
CRITICAL VALUE ALERT  Critical Value:  Hgb:  6.7  Date & Time Notied:  07/29/19 at 0830  Provider Notified: Dr. Loleta Books notified   Orders Received/Actions taken: Watch for signs and symptoms of bleeding, continue to monitor.

## 2019-07-30 ENCOUNTER — Inpatient Hospital Stay (HOSPITAL_COMMUNITY): Payer: Self-pay

## 2019-07-30 ENCOUNTER — Encounter (HOSPITAL_COMMUNITY): Payer: Self-pay | Admitting: Certified Registered Nurse Anesthetist

## 2019-07-30 ENCOUNTER — Encounter (HOSPITAL_COMMUNITY)
Admission: EM | Disposition: A | Discharge status: Rehabilitation Facility | Payer: Self-pay | Source: Home / Self Care | Attending: Family Medicine

## 2019-07-30 HISTORY — PX: I & D EXTREMITY: SHX5045

## 2019-07-30 LAB — CBC WITH DIFFERENTIAL/PLATELET
Abs Immature Granulocytes: 0.04 10*3/uL (ref 0.00–0.07)
Basophils Absolute: 0 10*3/uL (ref 0.0–0.1)
Basophils Relative: 1 %
Eosinophils Absolute: 0.2 10*3/uL (ref 0.0–0.5)
Eosinophils Relative: 4 %
HCT: 23.2 % — ABNORMAL LOW (ref 39.0–52.0)
Hemoglobin: 7.4 g/dL — ABNORMAL LOW (ref 13.0–17.0)
Immature Granulocytes: 1 %
Lymphocytes Relative: 19 %
Lymphs Abs: 1.2 10*3/uL (ref 0.7–4.0)
MCH: 30.1 pg (ref 26.0–34.0)
MCHC: 31.9 g/dL (ref 30.0–36.0)
MCV: 94.3 fL (ref 80.0–100.0)
Monocytes Absolute: 0.6 10*3/uL (ref 0.1–1.0)
Monocytes Relative: 9 %
Neutro Abs: 4.4 10*3/uL (ref 1.7–7.7)
Neutrophils Relative %: 66 %
Platelets: 299 10*3/uL (ref 150–400)
RBC: 2.46 MIL/uL — ABNORMAL LOW (ref 4.22–5.81)
RDW: 14.3 % (ref 11.5–15.5)
WBC: 6.5 10*3/uL (ref 4.0–10.5)
nRBC: 0.3 % — ABNORMAL HIGH (ref 0.0–0.2)

## 2019-07-30 LAB — BASIC METABOLIC PANEL
Anion gap: 9 (ref 5–15)
BUN: <5 mg/dL — ABNORMAL LOW (ref 6–20)
CO2: 31 mmol/L (ref 22–32)
Calcium: 8.5 mg/dL — ABNORMAL LOW (ref 8.9–10.3)
Chloride: 93 mmol/L — ABNORMAL LOW (ref 98–111)
Creatinine, Ser: 0.51 mg/dL — ABNORMAL LOW (ref 0.61–1.24)
GFR calc Af Amer: >60 mL/min (ref >60)
GFR calc non Af Amer: >60 mL/min (ref >60)
Glucose, Bld: 133 mg/dL — ABNORMAL HIGH (ref 70–99)
Potassium: 3.6 mmol/L (ref 3.5–5.1)
Sodium: 133 mmol/L — ABNORMAL LOW (ref 135–145)

## 2019-07-30 LAB — GLUCOSE, CAPILLARY
Glucose-Capillary: 129 mg/dL — ABNORMAL HIGH (ref 70–99)
Glucose-Capillary: 305 mg/dL — ABNORMAL HIGH (ref 70–99)
Glucose-Capillary: 252 mg/dL — ABNORMAL HIGH (ref 70–99)
Glucose-Capillary: 229 mg/dL — ABNORMAL HIGH (ref 70–99)

## 2019-07-30 SURGERY — IRRIGATION AND DEBRIDEMENT EXTREMITY
Anesthesia: General | Site: Knee | Laterality: Right

## 2019-07-30 MED ORDER — PROPOFOL 10 MG/ML IV BOLUS
INTRAVENOUS | Status: AC
  Filled 2019-07-30: qty 40

## 2019-07-30 MED ORDER — SUGAMMADEX SODIUM 200 MG/2ML IV SOLN
INTRAVENOUS | Status: DC | PRN
  Administered 2019-07-30: 300 mg via INTRAVENOUS

## 2019-07-30 MED ORDER — DEXAMETHASONE SODIUM PHOSPHATE 10 MG/ML IJ SOLN
INTRAMUSCULAR | Status: DC | PRN
  Administered 2019-07-30: 5 mg via INTRAVENOUS

## 2019-07-30 MED ORDER — FENTANYL CITRATE (PF) 250 MCG/5ML IJ SOLN
INTRAMUSCULAR | Status: DC | PRN
  Administered 2019-07-30: 100 ug via INTRAVENOUS
  Administered 2019-07-30 (×3): 50 ug via INTRAVENOUS

## 2019-07-30 MED ORDER — PROPOFOL 10 MG/ML IV BOLUS
INTRAVENOUS | Status: DC | PRN
  Administered 2019-07-30: 150 mg via INTRAVENOUS

## 2019-07-30 MED ORDER — FENTANYL CITRATE (PF) 250 MCG/5ML IJ SOLN
INTRAMUSCULAR | Status: AC
  Filled 2019-07-30: qty 5

## 2019-07-30 MED ORDER — 0.9 % SODIUM CHLORIDE (POUR BTL) OPTIME
TOPICAL | Status: DC | PRN
  Administered 2019-07-30: 1000 mL

## 2019-07-30 MED ORDER — FENTANYL CITRATE (PF) 100 MCG/2ML IJ SOLN
25.0000 ug | INTRAMUSCULAR | Status: DC | PRN
  Administered 2019-07-30 (×2): 50 ug via INTRAVENOUS

## 2019-07-30 MED ORDER — EPHEDRINE 5 MG/ML INJ
INTRAVENOUS | Status: AC
  Filled 2019-07-30: qty 10

## 2019-07-30 MED ORDER — OXYCODONE HCL 5 MG/5ML PO SOLN: 5.0000 mg | Freq: Once | ORAL | Status: AC | PRN

## 2019-07-30 MED ORDER — PROMETHAZINE HCL 25 MG/ML IJ SOLN: 6.2500 mg | INTRAMUSCULAR | Status: DC | PRN

## 2019-07-30 MED ORDER — OXYCODONE HCL 5 MG PO TABS
ORAL_TABLET | ORAL | Status: AC
  Filled 2019-07-30: qty 1

## 2019-07-30 MED ORDER — MIDAZOLAM HCL 2 MG/2ML IJ SOLN
INTRAMUSCULAR | Status: DC | PRN
Start: 2019-07-30 — End: 2019-07-30
  Administered 2019-07-30: 2 mg via INTRAVENOUS

## 2019-07-30 MED ORDER — FENTANYL CITRATE (PF) 100 MCG/2ML IJ SOLN
50.0000 ug | INTRAMUSCULAR | Status: DC | PRN
  Administered 2019-07-30 – 2019-07-31 (×2): 75 ug via INTRAVENOUS
  Administered 2019-08-01: 50 ug via INTRAVENOUS
  Filled 2019-07-30 (×3): qty 2

## 2019-07-30 MED ORDER — DEXAMETHASONE SODIUM PHOSPHATE 10 MG/ML IJ SOLN
INTRAMUSCULAR | Status: AC
  Filled 2019-07-30: qty 1

## 2019-07-30 MED ORDER — ONDANSETRON HCL 4 MG/2ML IJ SOLN
INTRAMUSCULAR | Status: AC
  Filled 2019-07-30: qty 2

## 2019-07-30 MED ORDER — LIDOCAINE 2% (20 MG/ML) 5 ML SYRINGE
INTRAMUSCULAR | Status: AC
  Filled 2019-07-30: qty 5

## 2019-07-30 MED ORDER — ONDANSETRON HCL 4 MG/2ML IJ SOLN
INTRAMUSCULAR | Status: DC | PRN
  Administered 2019-07-30: 4 mg via INTRAVENOUS

## 2019-07-30 MED ORDER — MIDAZOLAM HCL 2 MG/2ML IJ SOLN
INTRAMUSCULAR | Status: AC
  Filled 2019-07-30: qty 2

## 2019-07-30 MED ORDER — VANCOMYCIN HCL 1000 MG IV SOLR
INTRAVENOUS | Status: AC
  Filled 2019-07-30: qty 1000

## 2019-07-30 MED ORDER — FENTANYL CITRATE (PF) 100 MCG/2ML IJ SOLN
INTRAMUSCULAR | Status: AC
  Filled 2019-07-30: qty 2

## 2019-07-30 MED ORDER — TOBRAMYCIN SULFATE 1.2 G IJ SOLR
INTRAMUSCULAR | Status: AC
  Filled 2019-07-30: qty 1.2

## 2019-07-30 MED ORDER — OXYCODONE HCL 5 MG PO TABS
5.0000 mg | ORAL_TABLET | Freq: Once | ORAL | Status: AC | PRN
  Administered 2019-07-30: 09:00:00 5 mg via ORAL

## 2019-07-30 MED ORDER — SUCCINYLCHOLINE CHLORIDE 200 MG/10ML IV SOSY
PREFILLED_SYRINGE | INTRAVENOUS | Status: AC
  Filled 2019-07-30: qty 10

## 2019-07-30 MED ORDER — SODIUM CHLORIDE 0.9 % IR SOLN
Status: DC | PRN
  Administered 2019-07-30 (×2): 3000 mL

## 2019-07-30 MED ORDER — PHENYLEPHRINE 40 MCG/ML (10ML) SYRINGE FOR IV PUSH (FOR BLOOD PRESSURE SUPPORT)
PREFILLED_SYRINGE | INTRAVENOUS | Status: AC
  Filled 2019-07-30: qty 10

## 2019-07-30 MED ORDER — BACITRACIN ZINC 500 UNIT/GM EX OINT
TOPICAL_OINTMENT | CUTANEOUS | Status: AC
  Filled 2019-07-30: qty 28.35

## 2019-07-30 MED ORDER — LIDOCAINE 2% (20 MG/ML) 5 ML SYRINGE
INTRAMUSCULAR | Status: DC | PRN
  Administered 2019-07-30: 60 mg via INTRAVENOUS

## 2019-07-30 MED ORDER — LACTATED RINGERS IV SOLN
INTRAVENOUS | Status: DC | PRN
  Administered 2019-07-30: 07:00:00 via INTRAVENOUS

## 2019-07-30 MED ORDER — ROCURONIUM BROMIDE 10 MG/ML (PF) SYRINGE
PREFILLED_SYRINGE | INTRAVENOUS | Status: DC | PRN
  Administered 2019-07-30: 20 mg via INTRAVENOUS
  Administered 2019-07-30: 70 mg via INTRAVENOUS

## 2019-07-30 MED ORDER — ROCURONIUM BROMIDE 10 MG/ML (PF) SYRINGE
PREFILLED_SYRINGE | INTRAVENOUS | Status: AC
  Filled 2019-07-30: qty 10

## 2019-07-30 MED ORDER — PHENYLEPHRINE 40 MCG/ML (10ML) SYRINGE FOR IV PUSH (FOR BLOOD PRESSURE SUPPORT)
PREFILLED_SYRINGE | INTRAVENOUS | Status: DC | PRN
  Administered 2019-07-30: 80 ug via INTRAVENOUS
  Administered 2019-07-30: 40 ug via INTRAVENOUS

## 2019-07-30 SURGICAL SUPPLY — 52 items
BNDG COHESIVE 4X5 TAN STRL (GAUZE/BANDAGES/DRESSINGS) ×2 IMPLANT
BNDG GAUZE ELAST 4 BULKY (GAUZE/BANDAGES/DRESSINGS) ×4 IMPLANT
BRUSH SCRUB EZ PLAIN DRY (MISCELLANEOUS) ×4 IMPLANT
CANISTER WOUNDNEG PRESSURE 500 (CANNISTER) ×2 IMPLANT
CHLORAPREP W/TINT 26 (MISCELLANEOUS) ×2 IMPLANT
CONNECTOR Y ATS VAC SYSTEM (MISCELLANEOUS) ×2 IMPLANT
COVER MAYO STAND STRL (DRAPES) ×2 IMPLANT
COVER SURGICAL LIGHT HANDLE (MISCELLANEOUS) ×4 IMPLANT
COVER WAND RF STERILE (DRAPES) ×2 IMPLANT
DRAPE INCISE IOBAN 66X45 STRL (DRAPES) ×2 IMPLANT
DRAPE ORTHO SPLIT 77X108 STRL (DRAPES) ×1
DRAPE SURG 17X23 STRL (DRAPES) ×2 IMPLANT
DRAPE SURG ORHT 6 SPLT 77X108 (DRAPES) ×1 IMPLANT
DRAPE U-SHAPE 47X51 STRL (DRAPES) ×2 IMPLANT
DRSG ADAPTIC 3X8 NADH LF (GAUZE/BANDAGES/DRESSINGS) ×2 IMPLANT
DRSG VAC ATS MED SENSATRAC (GAUZE/BANDAGES/DRESSINGS) ×2 IMPLANT
ELECT REM PT RETURN 9FT ADLT (ELECTROSURGICAL)
ELECTRODE REM PT RTRN 9FT ADLT (ELECTROSURGICAL) IMPLANT
EVACUATOR 1/8 PVC DRAIN (DRAIN) IMPLANT
GAUZE SPONGE 4X4 12PLY STRL (GAUZE/BANDAGES/DRESSINGS) ×2 IMPLANT
GLOVE BIO SURGEON STRL SZ 6.5 (GLOVE) ×6 IMPLANT
GLOVE BIO SURGEON STRL SZ7.5 (GLOVE) ×8 IMPLANT
GLOVE BIOGEL PI IND STRL 6.5 (GLOVE) ×1 IMPLANT
GLOVE BIOGEL PI IND STRL 7.5 (GLOVE) ×1 IMPLANT
GLOVE BIOGEL PI INDICATOR 6.5 (GLOVE) ×1
GLOVE BIOGEL PI INDICATOR 7.5 (GLOVE) ×1
GOWN STRL REUS W/ TWL LRG LVL3 (GOWN DISPOSABLE) ×2 IMPLANT
GOWN STRL REUS W/TWL LRG LVL3 (GOWN DISPOSABLE) ×2
HANDPIECE INTERPULSE COAX TIP (DISPOSABLE)
KIT BASIN OR (CUSTOM PROCEDURE TRAY) ×2 IMPLANT
KIT TURNOVER KIT B (KITS) ×2 IMPLANT
MANIFOLD NEPTUNE II (INSTRUMENTS) ×2 IMPLANT
NS IRRIG 1000ML POUR BTL (IV SOLUTION) ×2 IMPLANT
PACK ORTHO EXTREMITY (CUSTOM PROCEDURE TRAY) ×2 IMPLANT
PAD ARMBOARD 7.5X6 YLW CONV (MISCELLANEOUS) ×4 IMPLANT
PADDING CAST COTTON 6X4 STRL (CAST SUPPLIES) ×2 IMPLANT
SET CYSTO W/LG BORE CLAMP LF (SET/KITS/TRAYS/PACK) ×2 IMPLANT
SET HNDPC FAN SPRY TIP SCT (DISPOSABLE) IMPLANT
SPONGE LAP 18X18 RF (DISPOSABLE) ×2 IMPLANT
STAPLER VISISTAT 35W (STAPLE) ×2 IMPLANT
SUT ETHILON 2 0 FS 18 (SUTURE) ×10 IMPLANT
SUT ETHILON 2 LR (SUTURE) ×2 IMPLANT
SUT ETHILON 3 0 PS 1 (SUTURE) ×4 IMPLANT
SUT MON AB 2-0 CT1 36 (SUTURE) ×2 IMPLANT
SUT PDS AB 0 CT 36 (SUTURE) IMPLANT
SWAB CULTURE ESWAB REG 1ML (MISCELLANEOUS) IMPLANT
TOWEL GREEN STERILE (TOWEL DISPOSABLE) ×4 IMPLANT
TOWEL GREEN STERILE FF (TOWEL DISPOSABLE) ×2 IMPLANT
TUBE CONNECTING 12X1/4 (SUCTIONS) ×2 IMPLANT
UNDERPAD 30X30 (UNDERPADS AND DIAPERS) ×2 IMPLANT
WATER STERILE IRR 1000ML POUR (IV SOLUTION) ×2 IMPLANT
YANKAUER SUCT BULB TIP NO VENT (SUCTIONS) ×2 IMPLANT

## 2019-07-30 NOTE — Transfer of Care (Signed)
Immediate Anesthesia Transfer of Care Note  Patient: Bradley Cox  Procedure(s) Performed: IRRIGATION AND DEBRIDEMENT EXTREMITY WITH WOUND VAC CHANGE (Right Knee)  Patient Location: PACU  Anesthesia Type:General  Level of Consciousness: drowsy and patient cooperative  Airway & Oxygen Therapy: Patient Spontanous Breathing and Patient connected to face mask oxygen  Post-op Assessment: Report given to RN, Post -op Vital signs reviewed and stable and Patient moving all extremities X 4  Post vital signs: Reviewed and stable  Last Vitals:  Vitals Value Taken Time  BP 115/71 07/30/19 0858  Temp    Pulse 109 07/30/19 0900  Resp 15 07/30/19 0900  SpO2 95 % 07/30/19 0900  Vitals shown include unvalidated device data.  Last Pain:  Vitals:   07/30/19 0622  TempSrc:   PainSc: 7       Patients Stated Pain Goal: 1 ([phone])  Complications: No apparent anesthesia complications

## 2019-07-30 NOTE — Anesthesia Postprocedure Evaluation (Signed)
Anesthesia Post Note  Patient: Bradley Cox  Procedure(s) Performed: IRRIGATION AND DEBRIDEMENT EXTREMITY WITH WOUND VAC CHANGE (Right Knee)     Patient location during evaluation: PACU Anesthesia Type: General Level of consciousness: awake and alert Pain management: satisfactory to patient Vital Signs Assessment: post-procedure vital signs reviewed and stable Respiratory status: spontaneous breathing, nonlabored ventilation and respiratory function stable Cardiovascular status: blood pressure returned to baseline and stable Postop Assessment: no apparent nausea or vomiting Anesthetic complications: no    Last Vitals:  Vitals:   07/30/19 0930 07/30/19 0939  BP: 124/74 110/78  Pulse: (!) 104 94  Resp: 16 13  Temp: 36.7 C 36.8 C  SpO2: 96%                    Audry Pili

## 2019-07-30 NOTE — Op Note (Signed)
Orthopaedic Surgery Operative Note (CSN: [id number] ) Date of Surgery: 07/30/2019  Admit Date: 07/05/2019   Diagnoses: Pre-Op Diagnoses: Right leg/hip abscess   Post-Op Diagnosis: Same  Procedures: 1. CPT 27301-Irrigation and debridement of right thigh abscess 2. CPT 97605-Incisional wound vac placement  Surgeons : Primary: Shona Needles, MD  Assistant: Patrecia Pace, PA-C  Location: OR 20   Anesthesia:General  Antibiotics: Ancef 2g scheduled   Tourniquet time:None  Estimated Blood [id number] mL  Complications:None   Specimens:None  Implants: * No implants in log *   Indications for Surgery: 48 year old male who had a new diagnosis of diabetes with a significant gluteal abscess and pelvic abscess underwent I&D initially.  He failed this and continue to drain he underwent repeat irrigation debridement.  He was doing okay until his posterior leg had spontaneous drainage.  MRI showed a huge posterior thigh abscess.  Last Friday on 07/27/2019 I took him for an irrigation debridement with wound VAC placement.  He presents for return to the operating room for repeat irrigation debridement with possible closure.  Risks and benefits were discussed with the patient the patient agrees to proceed with surgery consent was obtained.  Operative Findings: 1.  Posterior compartment of the thigh appeared without significant purulence no expressible fluid from proximal.  Repeat irrigation debridement with closure and incisional wound VAC placement. 2.  Drainage from previous hip incision that was opened up with approximately 150 mL of purulent drainage obtained.  Incisional wound VAC placed on this as well.  Procedure: The patient was identified in the preoperative holding area. Consent was confirmed with the patient and their family and all questions were answered. The operative extremity was marked after confirmation with the patient. he was then brought back to the operating room by our  anesthesia colleagues.  He was placed under general anesthetic and carefully transferred to a regular or table where he is placed prone.  All bony prominences were well-padded.  The right lower extremity then prepped and draped in usual sterile fashion.  Timeout was performed to verify the patient the procedure and the extremity.  The wound VAC had been removed.  The sponges that were in place were removed.  I then proceeded to use a Cobb to debride the subcutaneous skin and muscle.  There is no gross purulence or contamination.  There is good bleeding healthy appearing tissue.  I then used 6 L of normal saline using cystoscopy tubing to irrigate the cavity.  At this point I did made the decision that the tissue appeared healthy enough with out any overt signs of infection that we could proceed with closure of the wound.  Using a mixture of #2 and 2-0 nylon's a closure was performed.  An incisional VAC was placed over the incision line.  We broke down the drapes.  We did see that the previous hip was draining.  I removed the most 2 distal sutures and used a hemostat to open up the incision and approximately 150 mL of purulent material was expressed.  I then placed an incisional VAC over this area as well.  Patient was then awoken from anesthesia after he was placed supine and was taken to the PACU in stable condition.  Post Op Plan/Instructions: Patient will be weightbearing as tolerated to the right lower extremity.  Continue on IV antibiotics.  We will obtain a CT scan of his hip to evaluate for any further collections of his abscess.  I was present and performed the entire surgery.  Patrecia Pace, PA-C did assist me throughout the case. An assistant was necessary given the difficulty in approach, maintenance of reduction and ability to instrument the fracture.   Katha Hamming, MD [facility]

## 2019-07-30 NOTE — Progress Notes (Signed)
PROGRESS NOTE    Bradley Cox  [id number] DOB: [birth year] DOA: 07/05/2019 PCP: Simona Huh, NP      Brief Narrative:  Bradley Cox is a 48 y.o. M with hx HTN, DM, and asthma who presented with 3 weeks right flank pain after a Toradol shot.  In the ER, he was febrile, tachycardic, lactic acid elevated.  CT and MRI of the lumbar spine and right leg showed epidural and iliopsoas abscesses.  Neurosurgery recommended nonoperative treatment and he was admitted for antibiotics and IV insulin for DKA.      Interim History: Patient admitted and underwent IR guided paraspinal abscess drainage.   Blood cultures subsequently grew MSSA. TTE and then TEE showed no vegetations. Patient underwent surgical debrdiment of gluteal abscess and neuroplasty of right sciatic nerve. Underwent multiple subsequent I&Ds of the buttock wound as well as a posterior knee wound with wound VAC placement.        Assessment & Plan:  Sepsis from MSSA bacteremia L3-S1 paraspinal abscess Right iliopsoas abscess and gluteal abcess Right posterior thigh abscess  Presented with tachcyardia, fever, abscess and elevated lactate.  Blood cultures grew MSSA, now cleared.  IR ortho and ID consulted.  Has undergone aspiration of paraspinal abscess and serial debridement with now wound vac.  TEE negative.  ID recommend 4 weeks of antibiotics (four weeks from OR debridement would be 10/5) and then reassessment in their office for continued antibiotics.  Has PICC in place.  -Continue cefazolin, consider today day 1 of 28 after I&D -Plan for outpt ID follow up and Keflex after 4 weeks cefazolin  -PT mobilization -Maintain wound VAC, appreciate WOC care   Acute blood loss anemia Hgb 12 on admission, slowly trending down during hospitalization.  No clinical bleeding other than serosang drainage in VAC. Transfused 9/27 Hgb today 7.4 g/dL pre-op -Continue iron -Transfusion threshold 7 g/dL  Acute DVT, distal right  leg [place] on 9/5 negative.   Repeat US 9/26 showed acute DVT in posterior tibial calf veins, distal.    At the moment, I feel he has very limited small distal DVT, as well as anemia requiring transfusion; risk of systemic anticoagulation at present outweighs benefit -Continue PPx dose Lovenox -Repeat US on Wednesday -If DVT enlarges, if Hgb stabilizes, would be reasonable to start anticoagulation  Diabetes NPH held this AM, glucoses elevated -Continue SSI, mealtime insulin -Continue NPH -Continue gabapentin -Hold home metformin  Hypertension BP normal -Continue hydralazine  Asthma No clinically active bronchospasm  Hyponatremia Mild, stable  AKI Resolved  Hypokalemia Resolved  Constipation vs ileus vs gastroparesis Had prolonged difficulty advancing diet.  Now improved.     MDM and disposition: The below labs and imaging reports reviewed and summarized above.  Medication management as above.  The patient was admitted with MSSA bacteremia and multifocal soft tissue infections.  He has undergone serial debridements, and placement of wound VAC.  He continues to require IV antibiotics, frequent dosing of IV opiates, close monitoring of Hgb, possibly repeat blood transfusion, and high level nursing care.    We will explore inpatient rehab.     DVT prophylaxis: Lovenox Code Status: FULL Family Communication:     Consultants:   Orthopedics  Infectious disease  Interventional radiology  Neurosurgery  Procedures:   9/4 CT guided aspiration of right paraspinal abscess  9/5 TTE -- normal  9/5 Right LE doppler US -- no clot  9/7 I&D of right hip/pelvis abscess and Neuroplasty by Dr Doreatha Martin   9/9 TEE --  no veg  9/15 Repeat I&D of right hip abscess and application of wound Vac by Dr. Marcelino Scot  9/18 Repeat repeat I&D of right hip abscess and wound vac by Dr. Marcelino Scot  9/25 I&D of posterior right thigh abscess with wound vac by Dr. Doreatha Martin  9/25 Right LE  doppler US -- showed acute posterior tib vein thrombosis  Antimicrobials:   Vancomycin 9/3 >> 9/7  Cefepime 9/3 >> 9/4  Clindamycin x1 9/4                Cefazolin 9/5 >>                              Tobramycin x1 9/7                                                        Flagyl 9/8 >> 9/11                                                                                       Gentamicin x1 9/15   Culture data:   8/17 (prior to this admission) urine culture: MSSA  9/4 blood cultures x2: 2/2 positive for MSSA  9/4 urine culture: multiple species  9/4 paraspinal abscess aspirate: MSSA, fungus culture negative  9/7 surgical deep right hip culture: MSSA  9/7 blood culture x2: NG  9/25 right thigh abscess intraoperative: few MSSA     Subjective: Went to surgery this morning, now has severe pain in the right leg.  Appetite seems to be okay, no vomiting or fever.  No confusion, loss of consciousness.  No new focal redness or swelling in any other joints.     Objective: Vitals:   07/30/19 0915 07/30/19 0930 07/30/19 0939 07/30/19 1309  BP: 119/73 124/74 110/78 120/72  Pulse: (!) 111 (!) 104 94 97  Resp: 19 16 13 20   Temp:  98.1 F (36.7 C) 98.3 F (36.8 C) 98.7 F (37.1 C)  TempSrc:   Oral Oral  SpO2: 91% 96%  95%  Weight:      Height:        Intake/Output Summary (Last 24 hours) at 07/30/2019 1339 Last data filed at 07/30/2019 0900 Gross per 24 hour  Intake 925 ml  Output 4210 ml  Net -3285 ml   Filed Weights   07/05/19 1807 07/06/19 2116 07/27/19 1113  Weight: (!) 167.4 kg (!) 164 kg (!) 164 kg    Examination: General appearance: Obese adult male, lying in bed, no acute distress, interactive. HEENT: Anicteric, conjunctival pink, lids and lashes normal.  No nasal deformity, discharge, or epistaxis.  Lips moist, dentition normal, oropharynx moist, no oral lesions.  Hearing normal. Skin: Skin warm and dry, pale, no jaundice, no suspicious rashes or lesions.  Cardiac: Regular rate and rhythm, no murmurs appreciated, JVP not visible due to body habitus, mild right edema.  No calf tenderness on the right. Respiratory: Normal respiratory rate and  rhythm, lungs clear without rales or wheezes Abdomen: Abdomen soft without tenderness palpation or guarding.  No ascites or distention. MSK:  Neuro: Awake and alert, extraocular movements intact, moves upper extremities   with symmetric strength and coordination, speech fluent. Psych: Oriented to person, place, time and situation.  Attention normal, affect normal, judgment insight appear normal.    Data Reviewed: I have personally reviewed following labs and imaging studies:  CBC: Recent Labs  Lab 07/26/19 0410 07/27/19 0358 07/28/19 0553 07/29/19 0644 07/29/19 1801 07/30/19 0550  WBC 10.6* 9.7 8.8 6.4  --  6.5  NEUTROABS 8.0* 7.2 6.1 4.0  --  4.4  HGB 9.3* 8.9* 7.3* 6.7* 7.2* 7.4*  HCT 30.2* 28.4* 22.6* 20.3* 22.1* 23.2*  MCV 96.5 95.0 93.8 93.5  --  94.3  PLT 267 248 263 267  --  [id number]   Basic Metabolic Panel: Recent Labs  Lab 07/24/19 0325 07/25/19 0500 07/28/19 0553 07/29/19 0644 07/30/19 0550  NA 134* 133* 130* 134* 133*  K 3.8 3.8 4.1 3.6 3.6  CL 93* 94* 91* 91* 93*  CO2 31 30 30 31 31   GLUCOSE 102* 144* 161* 109* 133*  BUN 5* <5* 5* <5* <5*  CREATININE 0.52* 0.49* 0.60* 0.49* 0.51*  CALCIUM 8.5* 8.7* 8.4* 8.6* 8.5*   GFR: Estimated Creatinine Clearance: 188.1 mL/min (A) (by C-G formula based on SCr of 0.51 mg/dL (L)). Liver Function Tests: No results for input(s): AST, ALT, ALKPHOS, BILITOT, PROT, ALBUMIN in the last 168 hours. No results for input(s): LIPASE, AMYLASE in the last 168 hours. No results for input(s): AMMONIA in the last 168 hours. Coagulation Profile: No results for input(s): INR, PROTIME in the last 168 hours. Cardiac Enzymes: No results for input(s): CKTOTAL, CKMB, CKMBINDEX, TROPONINI in the last 168 hours. BNP (last 3 results) No results for input(s):  PROBNP in the last 8760 hours. HbA1C: No results for input(s): HGBA1C in the last 72 hours. CBG: Recent Labs  Lab 07/29/19 1152 07/29/19 1648 07/29/19 2102 07/30/19 0859 07/30/19 1312  GLUCAP 130* 149* 140* 129* 305*   Lipid Profile: Recent Labs    07/28/19 0553  CHOL 108  HDL 18*  LDLCALC 64  TRIG 130  CHOLHDL 6.0   Thyroid Function Tests: No results for input(s): TSH, T4TOTAL, FREET4, T3FREE, THYROIDAB in the last 72 hours. Anemia Panel: No results for input(s): VITAMINB12, FOLATE, FERRITIN, TIBC, IRON, RETICCTPCT in the last 72 hours. Urine analysis:    Component Value Date/Time   COLORURINE YELLOW 07/06/2019 [place] 07/06/2019 0437   LABSPEC 1.024 07/06/2019 0437   PHURINE 5.0 07/06/2019 0437   GLUCOSEU >=500 (A) 07/06/2019 0437   HGBUR MODERATE (A) 07/06/2019 0437   BILIRUBINUR NEGATIVE 07/06/2019 0437   KETONESUR 80 (A) 07/06/2019 0437   PROTEINUR NEGATIVE 07/06/2019 0437   NITRITE NEGATIVE 07/06/2019 0437   LEUKOCYTESUR NEGATIVE 07/06/2019 0437   Sepsis Labs: @LABRCNTIP (procalcitonin:4,lacticacidven:4)  ) Recent Results (from the past 240 hour(s))  Aerobic/Anaerobic Culture (surgical/deep wound)     Status: None (Preliminary result)   Collection Time: 07/27/19 12:56 PM   Specimen: PATH Other; Tissue  Result Value Ref Range Status   Specimen Description ABSCESS RIGHT THIGH  Final   Special Requests NONE  Final   Gram Stain   Final    NO WBC SEEN NO ORGANISMS SEEN Performed at [facility], [place] [address], [place], [place] [address]    Culture   Final    FEW STAPHYLOCOCCUS AUREUS NO ANAEROBES  ISOLATED; CULTURE IN PROGRESS FOR 5 DAYS    Report Status PENDING  Incomplete   Organism ID, Bacteria STAPHYLOCOCCUS AUREUS  Final      Susceptibility   Staphylococcus aureus - MIC*    CIPROFLOXACIN <=0.5 SENSITIVE Sensitive     ERYTHROMYCIN >=8 RESISTANT Resistant     GENTAMICIN <=0.5 SENSITIVE Sensitive     OXACILLIN <=0.25  SENSITIVE Sensitive     TETRACYCLINE >=16 RESISTANT Resistant     VANCOMYCIN 1 SENSITIVE Sensitive     TRIMETH/SULFA <=10 SENSITIVE Sensitive     CLINDAMYCIN RESISTANT Resistant     RIFAMPIN <=0.5 SENSITIVE Sensitive     Inducible Clindamycin POSITIVE Resistant     * FEW STAPHYLOCOCCUS AUREUS         Radiology Studies: No results found.      Scheduled Meds: . sodium chloride   Intravenous Once  . acetaminophen  1,000 mg Oral TID  . Chlorhexidine Gluconate Cloth  6 each Topical Daily  . enoxaparin (LOVENOX) injection  0.5 mg/kg Subcutaneous Q24H  . fentaNYL      . gabapentin  300 mg Oral TID  . hydrALAZINE  25 mg Oral BID  . influenza vac split quadrivalent PF  0.5 mL Intramuscular Tomorrow-1000  . insulin aspart  0-15 Units Subcutaneous TID WC  . insulin aspart  0-5 Units Subcutaneous QHS  . insulin aspart  8 Units Subcutaneous TID WC  . insulin NPH Human  28 Units Subcutaneous BID AC & HS  . iron polysaccharides  150 mg Oral Daily  . methocarbamol  750 mg Oral TID  . metoCLOPramide  10 mg Oral TID AC & HS  . oxyCODONE      . polyethylene glycol  17 g Oral BID  . senna-docusate  1 tablet Oral BID  . sodium chloride flush  10-40 mL Intracatheter Q12H   Continuous Infusions: .  ceFAZolin (ANCEF) IV 2 g (07/30/19 1023)     LOS: 24 days    Time spent: 35 minutes    Edwin Dada, MD [facility] 07/30/2019, 1:39 PM     Please page through [place]:  [email or web address] Password [email or web address] If 7PM-7AM, please contact night-coverage

## 2019-07-30 NOTE — Progress Notes (Signed)
Ortho Trauma Note  No acute events. Pain controlled. Wound vac lost suction yesterday.  Hgb 7.4 this AM after transfusion yesterday  Plan for repeat I&D with possible closure today. Risks and benefits discussed and patient agrees to proceed.  Shona Needles, MD [facility] [phone] (phone) [phone] (office) [email or web address]

## 2019-07-30 NOTE — Progress Notes (Addendum)
    [facility] for Infectious Disease   Reason for visit: Follow up on MSSA bacteremia/disseminated infection  Interval History: just back from the OR for further debridement.  WBC wnl, afebrile.  No associated rash or diarrhea.  No acute events.     Physical Exam: Constitutional:  Vitals:   07/30/19 0930 07/30/19 0939  BP: 124/74 110/78  Pulse: (!) 104 94  Resp: 16 13  Temp: 98.1 F (36.7 C) 98.3 F (36.8 C)  SpO2: 96%    patient appears in some distress with pain Eyes: anicteric Respiratory: Normal respiratory effort; CTA B Cardiovascular: RRR GI: soft, nt, nd Skin: no rashes  Review of Systems: Constitutional: negative for fevers and chills Gastrointestinal: negative for nausea and diarrhea Integument/breast: negative for rash  Lab Results  Component Value Date   WBC 6.5 07/30/2019   HGB 7.4 (L) 07/30/2019   HCT 23.2 (L) 07/30/2019   MCV 94.3 07/30/2019   PLT 299 07/30/2019    Lab Results  Component Value Date   CREATININE 0.51 (L) 07/30/2019   BUN <5 (L) 07/30/2019   NA 133 (L) 07/30/2019   K 3.6 07/30/2019   CL 93 (L) 07/30/2019   CO2 31 07/30/2019    Lab Results  Component Value Date   ALT 16 07/20/2019   AST 20 07/20/2019   ALKPHOS 70 07/20/2019     Microbiology: Recent Results (from the past 240 hour(s))  Aerobic/Anaerobic Culture (surgical/deep wound)     Status: None (Preliminary result)   Collection Time: 07/27/19 12:56 PM   Specimen: PATH Other; Tissue  Result Value Ref Range Status   Specimen Description ABSCESS RIGHT THIGH  Final   Special Requests NONE  Final   Gram Stain   Final    NO WBC SEEN NO ORGANISMS SEEN Performed at [facility], [address] [place], [place] [address]    Culture   Final    FEW STAPHYLOCOCCUS AUREUS NO ANAEROBES ISOLATED; CULTURE IN PROGRESS FOR 5 DAYS    Report Status PENDING  Incomplete   Organism ID, Bacteria STAPHYLOCOCCUS AUREUS  Final      Susceptibility   Staphylococcus aureus -  MIC*    CIPROFLOXACIN <=0.5 SENSITIVE Sensitive     ERYTHROMYCIN >=8 RESISTANT Resistant     GENTAMICIN <=0.5 SENSITIVE Sensitive     OXACILLIN <=0.25 SENSITIVE Sensitive     TETRACYCLINE >=16 RESISTANT Resistant     VANCOMYCIN 1 SENSITIVE Sensitive     TRIMETH/SULFA <=10 SENSITIVE Sensitive     CLINDAMYCIN RESISTANT Resistant     RIFAMPIN <=0.5 SENSITIVE Sensitive     Inducible Clindamycin POSITIVE Resistant     * FEW STAPHYLOCOCCUS AUREUS    Impression/Plan:  1. Disseminated infection, MSSA - now s/p debridement again with MSSA in cultures.  At this point, with extensive infection, I would extend his IV course for 4 more weeks from today's I and D through October 26th.  After that, will consider transition to oral therapy (Keflex), if indicated.    2.  Paraspinal abscess - debrided previously and on antibiotics as above.  3.  Medication monitoring - no leukopenia, thrombocytopenia.  Continue to monitor.

## 2019-07-30 NOTE — Anesthesia Procedure Notes (Signed)
Procedure Name: Intubation Performed by: Larene Beach, CRNA Pre-anesthesia Checklist: Patient identified, Emergency Drugs available, Suction available and Patient being monitored Patient Re-evaluated:Patient Re-evaluated prior to induction Oxygen Delivery Method: Circle system utilized Preoxygenation: Pre-oxygenation with 100% oxygen Induction Type: IV induction Ventilation: Mask ventilation without difficulty, Two handed mask ventilation required and Oral airway inserted - appropriate to patient size Laryngoscope Size: Mac and 4 Grade View: Grade I Tube type: Oral Tube size: 7.5 mm Number of attempts: 1 Airway Equipment and Method: Stylet Placement Confirmation: ETT inserted through vocal cords under direct vision,  positive ETCO2 and breath sounds checked- equal and bilateral Secured at: 22 cm Tube secured with: Tape Dental Injury: Teeth and Oropharynx as per pre-operative assessment

## 2019-07-31 ENCOUNTER — Encounter (HOSPITAL_COMMUNITY): Payer: Self-pay | Admitting: Student

## 2019-07-31 LAB — GLUCOSE, CAPILLARY
Glucose-Capillary: 146 mg/dL — ABNORMAL HIGH (ref 70–99)
Glucose-Capillary: 202 mg/dL — ABNORMAL HIGH (ref 70–99)
Glucose-Capillary: 100 mg/dL — ABNORMAL HIGH (ref 70–99)
Glucose-Capillary: 134 mg/dL — ABNORMAL HIGH (ref 70–99)

## 2019-07-31 LAB — CBC WITH DIFFERENTIAL/PLATELET
Abs Immature Granulocytes: 0.1 10*3/uL — ABNORMAL HIGH (ref 0.00–0.07)
Basophils Absolute: 0 10*3/uL (ref 0.0–0.1)
Basophils Relative: 0 %
Eosinophils Absolute: 0 10*3/uL (ref 0.0–0.5)
Eosinophils Relative: 1 %
HCT: 21.5 % — ABNORMAL LOW (ref 39.0–52.0)
Hemoglobin: 6.9 g/dL — CL (ref 13.0–17.0)
Immature Granulocytes: 2 %
Lymphocytes Relative: 26 %
Lymphs Abs: 1.5 10*3/uL (ref 0.7–4.0)
MCH: 30.8 pg (ref 26.0–34.0)
MCHC: 32.1 g/dL (ref 30.0–36.0)
MCV: 96 fL (ref 80.0–100.0)
Monocytes Absolute: 0.5 10*3/uL (ref 0.1–1.0)
Monocytes Relative: 9 %
Neutro Abs: 3.4 10*3/uL (ref 1.7–7.7)
Neutrophils Relative %: 62 %
Platelets: 299 10*3/uL (ref 150–400)
RBC: 2.24 MIL/uL — ABNORMAL LOW (ref 4.22–5.81)
RDW: 14.8 % (ref 11.5–15.5)
WBC: 5.5 10*3/uL (ref 4.0–10.5)
nRBC: 0.7 % — ABNORMAL HIGH (ref 0.0–0.2)

## 2019-07-31 LAB — HEMOGLOBIN AND HEMATOCRIT, BLOOD
HCT: 22.8 % — ABNORMAL LOW (ref 39.0–52.0)
Hemoglobin: 7.4 g/dL — ABNORMAL LOW (ref 13.0–17.0)

## 2019-07-31 LAB — BASIC METABOLIC PANEL
Anion gap: 11 (ref 5–15)
BUN: <5 mg/dL — ABNORMAL LOW (ref 6–20)
CO2: 29 mmol/L (ref 22–32)
Calcium: 8.6 mg/dL — ABNORMAL LOW (ref 8.9–10.3)
Chloride: 94 mmol/L — ABNORMAL LOW (ref 98–111)
Creatinine, Ser: 0.55 mg/dL — ABNORMAL LOW (ref 0.61–1.24)
GFR calc Af Amer: >60 mL/min (ref >60)
GFR calc non Af Amer: >60 mL/min (ref >60)
Glucose, Bld: 183 mg/dL — ABNORMAL HIGH (ref 70–99)
Potassium: 3.8 mmol/L (ref 3.5–5.1)
Sodium: 134 mmol/L — ABNORMAL LOW (ref 135–145)

## 2019-07-31 LAB — PREPARE RBC (CROSSMATCH)

## 2019-07-31 MED ORDER — SODIUM CHLORIDE 0.9% IV SOLUTION: Freq: Once | INTRAVENOUS | Status: DC

## 2019-07-31 MED ORDER — HISTAMINE PHOSPHATE 2.75 MG/ML IJ SOLN
0.1000 mL | Freq: Once | INTRAMUSCULAR | Status: DC
  Filled 2019-07-31: qty 5

## 2019-07-31 NOTE — Progress Notes (Signed)
Physical Therapy Treatment Patient Details Name: Bradley Cox MRN: [id number] DOB: 1971-07-25 Today's Date: 07/31/2019    History of Present Illness Patient is a 48 y/o male who presents with right flank pain. Admitted with disseminated MSSA infection including bacteremia, Lumbosacral spinal epidural abscess, paraspinal/large gluteal/pelvic abscess with sciatica impingement. s/p I&D large gluteal abscess and neuroplasty of sciatica nerve 9/7. s/p CT aspiration right paraspinal abscess. PMH includes DM, HTN, morbid obesity, OSA.    PT Comments    Patient limited due to low hemoglobin for planned transfusion this pm, but was able to sit up to EOB with no physical help.  Was able to perform bed exercises improving his AROM which would have allowed him to attempt to use the leg to stand today, but pt was symptomatic with lightheadedness seated EOB.  Still will hopefully progress to home once medically stable.  PT to follow.    Follow Up Recommendations  Home health PT;Supervision/Assistance - 24 hour     Equipment Recommendations  Rolling walker with 5" wheels;3in1 (PT)(bariatric)    Recommendations for Other Services       Precautions / Restrictions Precautions Precautions: Fall Precaution Comments: wound vac to R posterior thigh/behind knee Restrictions RLE Weight Bearing: Weight bearing as tolerated    Mobility  Bed Mobility Overal bed mobility: Needs Assistance       Supine to sit: Supervision;HOB elevated Sit to supine: Min assist;+2 for safety/equipment   General bed mobility comments: assist for R LE for in bed and for positioning in supine due to lines  Transfers                 General transfer comment: NT, EOB only due to low hemoglobin with pt symptomatic on EOB BP 111/67  Ambulation/Gait                 Stairs             Wheelchair Mobility    Modified Rankin (Stroke Patients Only)       Balance Overall balance assessment: Needs  assistance Sitting-balance support: Single extremity supported;Feet supported Sitting balance-Leahy Scale: Fair Sitting balance - Comments: leaning to L due to R hip pain, positioning with bed higher for comfort                                    Cognition Arousal/Alertness: Awake/alert Behavior During Therapy: WFL for tasks assessed/performed Overall Cognitive Status: Within Functional Limits for tasks assessed                                        Exercises General Exercises - Lower Extremity Ankle Circles/Pumps: AROM;Supine;Both Quad Sets: AROM;5 reps;Right;Supine Heel Slides: AAROM;10 reps;Supine    General Comments General comments (skin integrity, edema, etc.): encouraged AROM in supine      Pertinent Vitals/Pain Faces Pain Scale: Hurts even more Pain Location: R LE with movement Pain Descriptors / Indicators: Grimacing;Discomfort;Operative site guarding Pain Intervention(s): Repositioned;Monitored during session    Home Living                      Prior Function            PT Goals (current goals can now be found in the care plan section) Progress towards PT goals: Progressing toward goals  Frequency    Min 3X/week      PT Plan Discharge plan needs to be updated    Co-evaluation              AM-PAC PT "6 Clicks" Mobility   Outcome Measure  Help needed turning from your back to your side while in a flat bed without using bedrails?: A Little Help needed moving from lying on your back to sitting on the side of a flat bed without using bedrails?: A Little Help needed moving to and from a bed to a chair (including a wheelchair)?: A Lot Help needed standing up from a chair using your arms (e.g., wheelchair or bedside chair)?: A Lot Help needed to walk in hospital room?: A Lot Help needed climbing 3-5 steps with a railing? : Total 6 Click Score: 13    End of Session   Activity Tolerance: Patient limited by  pain;Treatment limited secondary to medical complications (Comment)(low hemoglobin for transfusion) Patient left: in bed;with call bell/phone within reach Nurse Communication: Patient requests pain meds PT Visit Diagnosis: Other abnormalities of gait and mobility (R26.89);Muscle weakness (generalized) (M62.81);Pain;Difficulty in walking, not elsewhere classified (R26.2) Pain - Right/Left: Right Pain - part of body: Hip     Time: [id number] PT Time Calculation (min) (ACUTE ONLY): 26 min  Charges:  $Therapeutic Exercise: 8-22 mins $Therapeutic Activity: 8-22 mins                     Magda Kiel, [place] [facility] [phone] 07/31/2019    Reginia Naas 07/31/2019, 4:05 PM

## 2019-07-31 NOTE — Progress Notes (Addendum)
Inpatient Rehabilitation Admissions Coordinator  Inpatient rehab consult received. Previously we signed off on 9/16 and recommended SNF for patient was unable to tolerate the intensity of an inpt rehab. I met with patient at bedside to again discuss his goals for d/c venue. He is much improved with his mobility and then develops additional abscesses and then limited by pain until drained. Pt plans to d/c home with his Dad where his Dad, Mom and family friend who is a Marine scientist can provide 24/7 assist as needed. He prefers d/c home for he is uninsured.  He feels that once pain managed with drainage of his abscesses, that he can plan d/c home. I await further therapy treatments and then I will follow up.  Danne Baxter, RN, MSN Rehab Admissions Coordinator [phone] 07/31/2019 10:58 AM

## 2019-07-31 NOTE — Progress Notes (Signed)
PHARMACY CONSULT NOTE FOR:  OUTPATIENT  PARENTERAL ANTIBIOTIC THERAPY (OPAT)  Indication: MSSA Bacteremia/ Epidural abscess Regimen: Cefazolin 2g IV q8h End date: 08/27/2019  IV antibiotic discharge orders are pended. To discharging provider:  please sign these orders via discharge navigator,  Select New Orders & click on the button choice - Manage This Unsigned Work.     Thank you for allowing pharmacy to be a part of this patient's care.  Jimmy Footman, PharmD, BCPS, BCIDP Infectious Diseases Clinical Pharmacist Phone: [phone] 07/31/2019, 12:16 PM

## 2019-07-31 NOTE — Progress Notes (Signed)
Orthopaedic Trauma Progress Note  S: Doing well, pain in back on knee but still improving  O:  Vitals:   07/30/19 2258 07/31/19 0734  BP: 115/73 118/76  Pulse: 85 94  Resp: (!) 21 (!) 23  Temp: 98.2 F (36.8 C) 98.8 F (37.1 C)  SpO2: 99% 98%   Gen: NAD RLE: incisional vacs in place with good seal, no significant output. Neurovascularly intact  Imaging: CT hip without significant abscess in hip  Labs:  Results for orders placed or performed during the hospital encounter of 07/05/19 (from the past 24 hour(s))  Glucose, capillary     Status: Abnormal   Collection Time: 07/30/19  8:59 AM  Result Value Ref Range   Glucose-Capillary 129 (H) 70 - 99 mg/dL  Glucose, capillary     Status: Abnormal   Collection Time: 07/30/19  1:12 PM  Result Value Ref Range   Glucose-Capillary 305 (H) 70 - 99 mg/dL  Glucose, capillary     Status: Abnormal   Collection Time: 07/30/19  5:02 PM  Result Value Ref Range   Glucose-Capillary 252 (H) 70 - 99 mg/dL  Glucose, capillary     Status: Abnormal   Collection Time: 07/30/19  9:23 PM  Result Value Ref Range   Glucose-Capillary 229 (H) 70 - 99 mg/dL  Glucose, capillary     Status: Abnormal   Collection Time: 07/31/19  7:31 AM  Result Value Ref Range   Glucose-Capillary 146 (H) 70 - 99 mg/dL    Assessment: 48 yo male new onset diabetes with multifocal abscesses s/p I&D hip and posterior leg  WBAT RLE Incisional wound vac until tomorrow vs Thurs  CV/Blood loss:Acute blood loss anemia. Would recommend recheck today vs tomorrow  VTE prophylaxis: Lovenox  ID: Ancef per ID  Medical co-morbidities: Diabetes for tight sugar control  Dispo: No further plans for surgery, okay to move toward discharge  Follow - up plan: Will continue to follow inpatient and plan for 2 week follow up upon discharge  Shona Needles, MD [facility] [phone] (phone) [phone] (office) [email or web address]

## 2019-07-31 NOTE — Progress Notes (Signed)
Patient educated regarding pain medications.  Patient advised importance of taking pain medications when needed, but also explained that pain medications shouldn't be taken to "keep him knocked out".  Patient aware of importance of having appropriate pain regimen, keeping pain under control, but not taking when only having mild pain.

## 2019-07-31 NOTE — Progress Notes (Signed)
CRITICAL VALUE ALERT  Critical Value:  Hgb:  6.9  Date & Time Notied:  07/31/19 at [place]  Provider Notified: Sent Dr. Loleta Books secure chat

## 2019-07-31 NOTE — Progress Notes (Signed)
PROGRESS NOTE    Bradley Cox  [id number] DOB: 1970-11-20 DOA: 07/05/2019 PCP: Simona Huh, NP      Brief Narrative:  Bradley Cox is a 48 y.o. M with hx HTN, DM, and asthma who presented with 3 weeks right flank pain after a Toradol shot.  In the ER, he was febrile, tachycardic, lactic acid elevated.  CT and MRI of the lumbar spine and right leg showed epidural and iliopsoas abscesses.  Neurosurgery recommended nonoperative treatment and he was admitted for antibiotics and IV insulin for DKA.      Interim History: Patient admitted and underwent IR guided paraspinal abscess drainage.   Blood cultures subsequently grew MSSA. TTE and then TEE showed no vegetations. Patient underwent surgical debrdiment of gluteal abscess and neuroplasty of right sciatic nerve. Underwent multiple subsequent I&Ds of the buttock wound as well as a posterior knee wound with wound VAC placement.        Assessment & Plan:  Sepsis from MSSA bacteremia L3-S1 paraspinal abscess Right iliopsoas abscess and gluteal abcess Right posterior thigh abscess  Presented with tachcyardia, fever, abscess and elevated lactate.  Blood cultures grew MSSA, now cleared.  IR ortho and ID consulted.    Has undergone aspiration of paraspinal abscess.  TEE negative.  Has undergone now serial debridements of his gluteal and posterior thigh wounds, wound VAC is in place, Ortho plan no further surgeries.  ID recommend 4 weeks of antibiotics (four weeks from OR debridement would be 10/5) and then reassessment in their office for continued antibiotics.  Has PICC in place.  -Continue cefazolin, consider today day 2 of 28 after I&D -OPAT orders in place for d/c -Will need Ortho follow up in 2 weeks with Dr. Doreatha Martin -Will need ID follow up in 4 weeks with Dr. Linus Salmons and Keflex after cefazolin  -PT mobilization -Maintain wound VAC, appreciate WOC care  -Consult to SW for nursing home placement    Acute blood loss  anemia Hgb 12 on admission, slowly trending down during hospitalization.  No clinical bleeding other than serosang drainage in VAC.  Transfused 9/27 and will transfuse again 9/29  Hgb today 6.9 g/dL again -Transfuse 1 unit again -Continue iron -Transfusion threshold 7 g/dL -Repeat CBC tomorrow  Acute DVT, distal right leg First time provoked VTE.   [place] on 9/5 negative.   Repeat US 9/26 showed acute DVT in posterior tibial calf veins, distal.    At the moment, I feel the limited risk of embolization from a calf DVT, as well as anemia requiring transfusion is low enough that the risk of systemic anticoagulation (or IVC filter) at present probably outweighs benefit -Continue PPx dose Lovenox -Repeat US tomorrow -If DVT enlarges, if Hgb stabilizes, would be reasonable to start anticoagulation prior to discharge  Diabetes Glucoses improved  -Continue SSI, mealtime insulin -Continue NPH -Continue gabapentin -Hold home metformin  Hypertension BP normal -Continue hydralazine  Asthma No clinically active bronchospasm  Hyponatremia Mild, stable  AKI Resolved  Hypokalemia Resolved  Constipation vs ileus vs gastroparesis Had prolonged difficulty advancing diet.  Now improved.         MDM and disposition: The below labs and imaging reports reviewed and summarized above.  Medication management as above.  The patient was admitted with MSSA bacteremia and multifocal soft tissue infections.  He has now completed serial debridements, and placement of a wound VAC, but he is requiring frequent dosing of IV opiates, repeat blood transfusion, and requires assistance for transfers, toileting.  Due  to insurance issues, he will likely need discharge to home with home health, so will need significantly more mobility prior to discharge.  In the meantime we will resolve the question of whether he needs an anticoagulant for his distal DVT, by monitoring his hemoglobin to see if it  stabilizes as well as repeating an ultrasound to see if this has enlarged.     DVT prophylaxis: Lovenox Code Status: FULL Family Communication:     Consultants:   Orthopedics  Infectious disease  Interventional radiology  Neurosurgery  Procedures:   9/4 CT guided aspiration of right paraspinal abscess  9/5 TTE -- normal  9/5 Right LE doppler US -- no clot  9/7 I&D of right hip/pelvis abscess and Neuroplasty by Dr Doreatha Martin   9/9 TEE -- no veg  9/15 Repeat I&D of right hip abscess and application of wound Vac by Dr. Marcelino Scot  9/18 Repeat repeat I&D of right hip abscess and wound vac by Dr. Marcelino Scot  9/25 I&D of posterior right thigh abscess with wound vac by Dr. Doreatha Martin  9/25 Right LE doppler US -- showed acute posterior tib vein thrombosis  Antimicrobials:   Vancomycin 9/3 >> 9/7  Cefepime 9/3 >> 9/4  Clindamycin x1 9/4                Cefazolin 9/5 >>                              Tobramycin x1 9/7                                                        Flagyl 9/8 >> 9/11                                                                                       Gentamicin x1 9/15   Culture data:   8/17 (prior to this admission) urine culture: MSSA  9/4 blood cultures x2: 2/2 positive for MSSA  9/4 urine culture: multiple species  9/4 paraspinal abscess aspirate: MSSA, fungus culture negative  9/7 surgical deep right hip culture: MSSA  9/7 blood culture x2: NG  9/25 right thigh abscess intraoperative: few MSSA     Subjective: Pain in the right leg is overall improving, but still severe, limiting movement.  No new fever, vomiting, confusion.  Appetite good.  No chest pain, shortness of breath.  The discharge in his wound VAC was negligible, until he moved with physical therapy, and now there is been about 50 cc reddish liquid.      Objective: Vitals:   07/30/19 2258 07/31/19 0734 07/31/19 1235 07/31/19 1340  BP: 115/73 118/76 106/62 120/78  Pulse:  85 94 95 95  Resp: (!) 21 (!) 23 19 17   Temp: 98.2 F (36.8 C) 98.8 F (37.1 C) 97.7 F (36.5 C) 98.4 F (36.9 C)  TempSrc: Oral Oral Oral Oral  SpO2: 99% 98% 100%   Weight:  Height:        Intake/Output Summary (Last 24 hours) at 07/31/2019 1353 Last data filed at 07/31/2019 0800 Gross per 24 hour  Intake 1934.39 ml  Output 1600 ml  Net 334.39 ml   Filed Weights   07/05/19 1807 07/06/19 2116 07/27/19 1113  Weight: (!) 167.4 kg (!) 164 kg (!) 164 kg    Examination: General appearance: Large adult male, lying in bed, no acute distress, interactive, appears tired. HEENT: Anicteric, conjunctival pink, lids and lashes normal.  No nasal deformity, discharge, or epistaxis.  Lips moist, dentition normal, oropharynx moist, no oral lesions.  Hearing normal. Skin: Skin warm and dry, pale, no jaundice, no suspicious rashes or lesions. Cardiac: Regular rate and rhythm, no murmurs, JVP not visible, some right-sided pitting edema, stable Respiratory: Normal respiratory rate and rhythm, lungs clear without rales or wheezes Abdomen: Abdomen soft without tenderness to palpation or guarding, no ascites or distention MSK: No redness or effusions of any other joints, no deformities or effusions of the upper extremities bilaterally, the wound VAC is in place and well situated Neuro: Alert and oriented, extraocular movements intact, moves all extremities with normal strength and coordination, speech fluent Psych: Oriented to person, place, time, situation, attention normal, affect normal, judgment insight appear normal.      Data Reviewed: I have personally reviewed following labs and imaging studies:  CBC: Recent Labs  Lab 07/27/19 0358 07/28/19 0553 07/29/19 0644 07/29/19 1801 07/30/19 0550 07/31/19 0745  WBC 9.7 8.8 6.4  --  6.5 5.5  NEUTROABS 7.2 6.1 4.0  --  4.4 3.4  HGB 8.9* 7.3* 6.7* 7.2* 7.4* 6.9*  HCT 28.4* 22.6* 20.3* 22.1* 23.2* 21.5*  MCV 95.0 93.8 93.5  --  94.3 96.0   PLT 248 263 267  --  299 [id number]   Basic Metabolic Panel: Recent Labs  Lab 07/25/19 0500 07/28/19 0553 07/29/19 0644 07/30/19 0550 07/31/19 0745  NA 133* 130* 134* 133* 134*  K 3.8 4.1 3.6 3.6 3.8  CL 94* 91* 91* 93* 94*  CO2 30 30 31 31 29   GLUCOSE 144* 161* 109* 133* 183*  BUN <5* 5* <5* <5* <5*  CREATININE 0.49* 0.60* 0.49* 0.51* 0.55*  CALCIUM 8.7* 8.4* 8.6* 8.5* 8.6*   GFR: Estimated Creatinine Clearance: 188.1 mL/min (A) (by C-G formula based on SCr of 0.55 mg/dL (L)). Liver Function Tests: No results for input(s): AST, ALT, ALKPHOS, BILITOT, PROT, ALBUMIN in the last 168 hours. No results for input(s): LIPASE, AMYLASE in the last 168 hours. No results for input(s): AMMONIA in the last 168 hours. Coagulation Profile: No results for input(s): INR, PROTIME in the last 168 hours. Cardiac Enzymes: No results for input(s): CKTOTAL, CKMB, CKMBINDEX, TROPONINI in the last 168 hours. BNP (last 3 results) No results for input(s): PROBNP in the last 8760 hours. HbA1C: No results for input(s): HGBA1C in the last 72 hours. CBG: Recent Labs  Lab 07/30/19 1312 07/30/19 1702 07/30/19 2123 07/31/19 0731 07/31/19 1227  GLUCAP 305* 252* 229* 146* 202*   Lipid Profile: No results for input(s): CHOL, HDL, LDLCALC, TRIG, CHOLHDL, LDLDIRECT in the last 72 hours. Thyroid Function Tests: No results for input(s): TSH, T4TOTAL, FREET4, T3FREE, THYROIDAB in the last 72 hours. Anemia Panel: No results for input(s): VITAMINB12, FOLATE, FERRITIN, TIBC, IRON, RETICCTPCT in the last 72 hours. Urine analysis:    Component Value Date/Time   COLORURINE YELLOW 07/06/2019 [place] 07/06/2019 0437   LABSPEC 1.024 07/06/2019 [place]  5.0 07/06/2019 0437   GLUCOSEU >=500 (A) 07/06/2019 0437   HGBUR MODERATE (A) 07/06/2019 0437   BILIRUBINUR NEGATIVE 07/06/2019 0437   KETONESUR 80 (A) 07/06/2019 0437   PROTEINUR NEGATIVE 07/06/2019 0437   NITRITE NEGATIVE 07/06/2019 0437    LEUKOCYTESUR NEGATIVE 07/06/2019 0437   Sepsis Labs: @LABRCNTIP (procalcitonin:4,lacticacidven:4)  ) Recent Results (from the past 240 hour(s))  Aerobic/Anaerobic Culture (surgical/deep wound)     Status: None (Preliminary result)   Collection Time: 07/27/19 12:56 PM   Specimen: PATH Other; Tissue  Result Value Ref Range Status   Specimen Description ABSCESS RIGHT THIGH  Final   Special Requests NONE  Final   Gram Stain   Final    NO WBC SEEN NO ORGANISMS SEEN Performed at [facility], [place] [address], [place],  [address]    Culture   Final    FEW STAPHYLOCOCCUS AUREUS NO ANAEROBES ISOLATED; CULTURE IN PROGRESS FOR 5 DAYS    Report Status PENDING  Incomplete   Organism ID, Bacteria STAPHYLOCOCCUS AUREUS  Final      Susceptibility   Staphylococcus aureus - MIC*    CIPROFLOXACIN <=0.5 SENSITIVE Sensitive     ERYTHROMYCIN >=8 RESISTANT Resistant     GENTAMICIN <=0.5 SENSITIVE Sensitive     OXACILLIN <=0.25 SENSITIVE Sensitive     TETRACYCLINE >=16 RESISTANT Resistant     VANCOMYCIN 1 SENSITIVE Sensitive     TRIMETH/SULFA <=10 SENSITIVE Sensitive     CLINDAMYCIN RESISTANT Resistant     RIFAMPIN <=0.5 SENSITIVE Sensitive     Inducible Clindamycin POSITIVE Resistant     * FEW STAPHYLOCOCCUS AUREUS         Radiology Studies: Ct Hip Right Wo Contrast  Result Date: 07/30/2019 CLINICAL DATA:  Right hip abscess. EXAM: CT OF THE RIGHT HIP WITHOUT CONTRAST TECHNIQUE: Multidetector CT imaging of the right hip was performed according to the standard protocol. Multiplanar CT image reconstructions were also generated. COMPARISON:  None. FINDINGS: Bones/Joint/Cartilage Widening of the right sacroiliac joint with erosive changes most concerning for septic arthritis. Nondisplaced fracture of the inferior aspect of the right sacrum adjacent to the sacroiliac joint. Subchondral fracture involving the right ilium along the right SI joint. Small amount air along the anterior  margin of the right sacroiliac joint within the iliopsoas muscle. No acute fracture or dislocation.  Normal alignment. Ligaments Ligaments are suboptimally evaluated by CT. Soft tissue, Muscles and Tendons Soft tissue wound overlying the right greater trochanter with air within the subcutaneous fat. There has been significant interval improvement in the complex fluid collection involving the right gluteal musculature extending into the pelvis. Small amount air in the left semimembranosus muscle partially visualized. No fluid collection or hematoma.  No soft tissue mass. IMPRESSION: 1. 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 Interval development of a subchondral fracture involving the right ilium and a nondisplaced fracture along the inferior aspect of the right sacrum. 2. Soft tissue wound overlying the right greater trochanter with air within the subcutaneous fat likely postsurgical. Significant interval improvement in the complex fluid collection involving the right gluteal musculature extending into the pelvis. 3. Small amount air in the left semimembranosus muscle partially visualized most concerning for persistent infection in the absence recent instrumentation involving this area. Electronically Signed   By: Kathreen Devoid   On: 07/30/2019 16:56        Scheduled Meds: . sodium chloride   Intravenous Once  . sodium chloride   Intravenous Once  . acetaminophen  1,000  mg Oral TID  . Chlorhexidine Gluconate Cloth  6 each Topical Daily  . enoxaparin (LOVENOX) injection  0.5 mg/kg Subcutaneous Q24H  . gabapentin  300 mg Oral TID  . hydrALAZINE  25 mg Oral BID  . influenza vac split quadrivalent PF  0.5 mL Intramuscular Tomorrow-1000  . insulin aspart  0-15 Units Subcutaneous TID WC  . insulin aspart  0-5 Units Subcutaneous QHS  . insulin aspart  8 Units Subcutaneous TID WC  . insulin NPH Human  28 Units Subcutaneous BID AC & HS  . iron polysaccharides  150 mg Oral Daily  .  methocarbamol  750 mg Oral TID  . metoCLOPramide  10 mg Oral TID AC & HS  . polyethylene glycol  17 g Oral BID  . senna-docusate  1 tablet Oral BID  . sodium chloride flush  10-40 mL Intracatheter Q12H   Continuous Infusions: .  ceFAZolin (ANCEF) IV 2 g (07/31/19 1141)     LOS: 25 days    Time spent: 25 minutes     Edwin Dada, MD [facility] 07/31/2019, 1:53 PM     Please page through [place]:  [email or web address] Password [email or web address] If 7PM-7AM, please contact night-coverage

## 2019-08-01 ENCOUNTER — Inpatient Hospital Stay (HOSPITAL_COMMUNITY): Payer: Self-pay

## 2019-08-01 DIAGNOSIS — I82442 Acute embolism and thrombosis of left tibial vein: Secondary | ICD-10-CM

## 2019-08-01 DIAGNOSIS — I82441 Acute embolism and thrombosis of right tibial vein: Secondary | ICD-10-CM

## 2019-08-01 DIAGNOSIS — M4646 Discitis, unspecified, lumbar region: Secondary | ICD-10-CM

## 2019-08-01 LAB — AEROBIC/ANAEROBIC CULTURE W GRAM STAIN (SURGICAL/DEEP WOUND): Gram Stain: NONE SEEN

## 2019-08-01 LAB — CBC WITH DIFFERENTIAL/PLATELET
Abs Immature Granulocytes: 0.09 10*3/uL — ABNORMAL HIGH (ref 0.00–0.07)
Basophils Absolute: 0 10*3/uL (ref 0.0–0.1)
Basophils Relative: 0 %
Eosinophils Absolute: 0.2 10*3/uL (ref 0.0–0.5)
Eosinophils Relative: 2 %
HCT: 23.9 % — ABNORMAL LOW (ref 39.0–52.0)
Hemoglobin: 7.7 g/dL — ABNORMAL LOW (ref 13.0–17.0)
Immature Granulocytes: 1 %
Lymphocytes Relative: 17 %
Lymphs Abs: 1.6 10*3/uL (ref 0.7–4.0)
MCH: 31.2 pg (ref 26.0–34.0)
MCHC: 32.2 g/dL (ref 30.0–36.0)
MCV: 96.8 fL (ref 80.0–100.0)
Monocytes Absolute: 0.6 10*3/uL (ref 0.1–1.0)
Monocytes Relative: 6 %
Neutro Abs: 7 10*3/uL (ref 1.7–7.7)
Neutrophils Relative %: 74 %
Platelets: 302 10*3/uL (ref 150–400)
RBC: 2.47 MIL/uL — ABNORMAL LOW (ref 4.22–5.81)
RDW: 15.6 % — ABNORMAL HIGH (ref 11.5–15.5)
WBC: 9.5 10*3/uL (ref 4.0–10.5)
nRBC: 0.5 % — ABNORMAL HIGH (ref 0.0–0.2)

## 2019-08-01 LAB — BPAM RBC
Blood Product Expiration Date: 202010012359
Blood Product Expiration Date: 202010212359
ISSUE DATE / TIME: 202009271351
ISSUE DATE / TIME: 202009291350
Unit Type and Rh: 600
Unit Type and Rh: 6200

## 2019-08-01 LAB — BASIC METABOLIC PANEL
Anion gap: 9 (ref 5–15)
BUN: 5 mg/dL — ABNORMAL LOW (ref 6–20)
CO2: 29 mmol/L (ref 22–32)
Calcium: 8.1 mg/dL — ABNORMAL LOW (ref 8.9–10.3)
Chloride: 95 mmol/L — ABNORMAL LOW (ref 98–111)
Creatinine, Ser: 0.61 mg/dL (ref 0.61–1.24)
GFR calc Af Amer: >60 mL/min (ref >60)
GFR calc non Af Amer: >60 mL/min (ref >60)
Glucose, Bld: 92 mg/dL (ref 70–99)
Potassium: 3.5 mmol/L (ref 3.5–5.1)
Sodium: 133 mmol/L — ABNORMAL LOW (ref 135–145)

## 2019-08-01 LAB — GLUCOSE, CAPILLARY
Glucose-Capillary: 126 mg/dL — ABNORMAL HIGH (ref 70–99)
Glucose-Capillary: 142 mg/dL — ABNORMAL HIGH (ref 70–99)
Glucose-Capillary: 80 mg/dL (ref 70–99)
Glucose-Capillary: 129 mg/dL — ABNORMAL HIGH (ref 70–99)

## 2019-08-01 LAB — TYPE AND SCREEN
ABO/RH(D): A POS
Antibody Screen: NEGATIVE
Unit division: 0
Unit division: 0

## 2019-08-01 MED ORDER — AMOXICILLIN 500 MG PO CAPS
500.0000 mg | ORAL_CAPSULE | Freq: Once | ORAL | Status: AC
  Administered 2019-08-01: 500 mg via ORAL
  Filled 2019-08-01: qty 1

## 2019-08-01 MED ORDER — PENICILLIN G IN SODIUM CHLORIDE 10,000 UNITS/ML VIAL FOR SKIN TEST (NO CHARGE)
1000.0000 [IU] | Freq: Once | INTRADERMAL | Status: AC
  Administered 2019-08-01: 16:00:00 1000 [IU] via INTRADERMAL
  Filled 2019-08-01: qty 1

## 2019-08-01 MED ORDER — OXYCODONE HCL 5 MG PO TABS
10.0000 mg | ORAL_TABLET | ORAL | Status: DC | PRN
  Administered 2019-08-01 – 2019-08-16 (×49): 10 mg via ORAL
  Filled 2019-08-01 (×52): qty 2

## 2019-08-01 MED ORDER — HISTAMINE PHOSPHATE 2.75 MG/ML IJ SOLN
0.1000 mL | Freq: Once | INTRAMUSCULAR | Status: AC
  Administered 2019-08-01: 0.1 mL via TOPICAL
  Filled 2019-08-01: qty 5

## 2019-08-01 MED ORDER — SODIUM CHLORIDE 0.9% FLUSH
0.1000 mL | Freq: Once | INTRAVENOUS | Status: AC
  Administered 2019-08-01: 0.1 mL via TOPICAL

## 2019-08-01 MED ORDER — HYDROMORPHONE HCL 1 MG/ML IJ SOLN
1.0000 mg | INTRAMUSCULAR | Status: DC | PRN
  Administered 2019-08-03 – 2019-08-09 (×22): 1 mg via INTRAVENOUS
  Filled 2019-08-01 (×22): qty 1

## 2019-08-01 MED ORDER — IOHEXOL 350 MG/ML SOLN
100.0000 mL | Freq: Once | INTRAVENOUS | Status: AC | PRN
  Administered 2019-08-01: 100 mL via INTRAVENOUS

## 2019-08-01 MED ORDER — DIPHENHYDRAMINE HCL 50 MG/ML IJ SOLN: 25.0000 mg | Freq: Once | INTRAMUSCULAR | Status: DC | PRN

## 2019-08-01 MED ORDER — BENZYLPENICILLOYL POLYLYSINE 0.25 ML ID SOLN (NO-CHARGE)
0.1000 mL | Freq: Once | INTRADERMAL | Status: AC
  Administered 2019-08-01: 16:00:00 0.1 mL via INTRADERMAL
  Filled 2019-08-01: qty 0.25

## 2019-08-01 MED ORDER — BENZYLPENICILLOYL POLYLYSINE 0.25 ML ID SOLN (NO-CHARGE)
0.1000 mL | Freq: Once | INTRADERMAL | Status: AC
  Administered 2019-08-01: 0.1 mL via INTRADERMAL
  Filled 2019-08-01: qty 0.25

## 2019-08-01 MED ORDER — SODIUM CHLORIDE 0.9% FLUSH
0.1000 mL | Freq: Once | INTRAVENOUS | Status: AC
  Administered 2019-08-01: 16:00:00 0.1 mL via INTRADERMAL

## 2019-08-01 MED ORDER — BENZYLPENICILLOYL POLYLYSINE 0.25 ML ID SOLN
0.0600 mL | Freq: Once | INTRADERMAL | Status: AC
  Administered 2019-08-01: 0.06 mL via TOPICAL
  Filled 2019-08-01: qty 0.25

## 2019-08-01 MED ORDER — HEPARIN (PORCINE) 25000 UT/250ML-% IV SOLN
2600.0000 [IU]/h | INTRAVENOUS | Status: DC
  Administered 2019-08-01: 22:00:00 2100 [IU]/h via INTRAVENOUS
  Administered 2019-08-02: 2300 [IU]/h via INTRAVENOUS
  Administered 2019-08-02: 11:00:00 2100 [IU]/h via INTRAVENOUS
  Administered 2019-08-03: 21:00:00 2600 [IU]/h via INTRAVENOUS
  Administered 2019-08-03: 10:00:00 2500 [IU]/h via INTRAVENOUS
  Administered 2019-08-04: 2600 [IU]/h via INTRAVENOUS
  Filled 2019-08-01 (×7): qty 250

## 2019-08-01 MED ORDER — PENICILLIN G IN SODIUM CHLORIDE 10,000 UNITS/ML VIAL FOR SKIN TEST
1000.0000 [IU] | Freq: Once | INTRADERMAL | Status: AC
  Administered 2019-08-01: 1000 [IU] via TOPICAL
  Filled 2019-08-01: qty 1

## 2019-08-01 NOTE — Progress Notes (Addendum)
PROGRESS NOTE        PATIENT DETAILS Name: Bradley Cox Age: 48 y.o. Sex: male Date of Birth: [birth year] Admit Date: 07/05/2019 Admitting Physician Norval Morton, MD [id number], Apolonio Schneiders, NP  Brief Narrative: Patient is a 48 y.o. 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  Has undergone serial debridement-evaluated by ID-with recommendations to continue with Ancef-stop date of 10/26.  Subjective: Lying comfortably in bed-no major issues overnight.   Assessment/Plan: Sepsis secondary to disseminated MSSA infection including bacteremia, L3-S1 paraspinal abscess, right psoas abscess right posterior thigh abscess: Sepsis pathophysiology has resolved-clinically improved-patient has undergone serial debridement of the right thigh abscess by orthopedics.  Patient is s/p CT-guided drainage of the paraspinal abscess by IR.  ID recommending to continue Ancef with stop date of 10/26.  Anemia: I suspect this is multifactorial-some amount of blood loss from repeated I&D's-but also probably from anemia related to critical illness.  Hemoglobin stable-patient is s/p PRBC transfusion on 9/27 and 9/29.  DVT right lower extremity: Probably provoked-given hospitalization/repeated surgeries-very difficult situation-has developed severe anemia with concern for some blood loss at the operative site-requiring PRBC transfusion.  Thankfully DVT is below the knee-awaiting repeat Doppler today-we will touch base with vascular surgery.  For now remains on prophylactic dosing of Lovenox.  Addendum: repeat Doppler + DVT-patient had a syncopal episode while working with PT-start Heparin gtt-check CTA Chest-although not hypoxic or chest pain. Discussed case with [facility]-Dr  Brabham-recommends anticoag as well.   DKA: Resolved  Insulin-dependent DM-2: CBGs currently stable-continue NPH 28 units twice daily, 8 units of NovoLog with meals and SSI  HTN: BP stable-continue hydralazine  Asthma: Stable without any evidence of bronchospasm.  Obesity: Estimated body mass index is 41.78 kg/m as calculated from the following:   Height as of this encounter: 6\' 6"  (1.981 m).   Weight as of this encounter: 164 kg.   Diet: Diet Order            Diet Carb Modified Fluid consistency: Thin; Room service appropriate? Yes  Diet effective now               DVT Prophylaxis: Prophylactic Lovenox   Code Status: Full code   Family Communication: None at bedside  Disposition Plan: Remain inpatient  Antimicrobial agents: Anti-infectives (From admission, onward)   Start     Dose/Rate Route Frequency Ordered Stop   08/01/19 1315  penicillin G in sodium chloride 10,000 units/mL syringe for skin test - 1st bleb     1,000 Units Intradermal  Once 08/01/19 1042     08/01/19 1315  penicillin G in sodium chloride 10,000 units/mL syringe for skin test - 2nd bleb     1,000 Units Intradermal  Once 08/01/19 1042     08/01/19 1300  penicillin G in sodium chloride 10,000 units/mL syringe for skin test     1,000 Units Topical  Once 08/01/19 1042     07/17/19 1857  gentamicin (GARAMYCIN) injection  Status:  Discontinued       As needed 07/17/19 1857 07/17/19 2009   07/17/19 1857  vancomycin (VANCOCIN) powder  Status:  Discontinued       As needed 07/17/19 1858 07/17/19 2009  07/10/19 1600  metroNIDAZOLE (FLAGYL) IVPB 500 mg  Status:  Discontinued     500 mg 100 mL/hr over 60 Minutes Intravenous Every 8 hours 07/10/19 1549 07/13/19 0939   07/09/19 0839  vancomycin (VANCOCIN) powder  Status:  Discontinued       As needed 07/09/19 0839 07/09/19 0935   07/09/19 0839  tobramycin (NEBCIN) powder  Status:  Discontinued       As needed 07/09/19 0840 07/09/19 0935   07/07/19 1830   ceFAZolin (ANCEF) IVPB 2g/100 mL premix     2 g 200 mL/hr over 30 Minutes Intravenous Every 8 hours 07/07/19 1333     07/07/19 0900  ceFAZolin (ANCEF) IVPB 2g/100 mL premix  Status:  Discontinued     2 g 200 mL/hr over 30 Minutes Intravenous Every 8 hours 07/07/19 0847 07/07/19 1333   07/06/19 2030  clindamycin (CLEOCIN) IVPB 600 mg  Status:  Discontinued     600 mg 100 mL/hr over 30 Minutes Intravenous Every 8 hours 07/06/19 2010 07/07/19 0847   07/06/19 1800  vancomycin (VANCOCIN) 1,500 mg in sodium chloride 0.9 % 500 mL IVPB  Status:  Discontinued     1,500 mg 250 mL/hr over 120 Minutes Intravenous Every 12 hours 07/06/19 0820 07/07/19 0847   07/06/19 1400  ceFEPIme (MAXIPIME) 2 g in sodium chloride 0.9 % 100 mL IVPB  Status:  Discontinued     2 g 200 mL/hr over 30 Minutes Intravenous Every 8 hours 07/06/19 0504 07/07/19 0847   07/06/19 1400  vancomycin (VANCOCIN) 2,000 mg in sodium chloride 0.9 % 500 mL IVPB  Status:  Discontinued     2,000 mg 250 mL/hr over 120 Minutes Intravenous Every 8 hours 07/06/19 0504 07/06/19 0818   07/06/19 0745  clindamycin (CLEOCIN) IVPB 600 mg  Status:  Discontinued     600 mg 100 mL/hr over 30 Minutes Intravenous  Once 07/06/19 0737 07/06/19 2029   07/06/19 0500  ceFEPIme (MAXIPIME) 2 g in sodium chloride 0.9 % 100 mL IVPB     2 g 200 mL/hr over 30 Minutes Intravenous  Once 07/06/19 0451 07/06/19 1335   07/06/19 0500  vancomycin (VANCOCIN) 2,500 mg in sodium chloride 0.9 % 500 mL IVPB     2,500 mg 250 mL/hr over 120 Minutes Intravenous  Once 07/06/19 0451 07/06/19 1335      Procedures: 9/4>> CT-guided aspiration of right paraspinal abscess at L4-L5 07/09/19>>  incision and drainage of the right hip/pelvic abscess along with neuroplasty of the right sciatic nerve by neurosurgery 9/9>>TEE 07/17/2019>>I&D of right gluteal abscess with drainage of 300 cc purulent fluid.   07/20/19>> Repeat irrigation and debridement right hip and application of wound VAC   07/27/19>> incision and drainage of posterior right thigh abscess -wound VAC placement 9/28>> repeat irrigation and debridement of right thigh abscess, incisional wound VAC placement.   CONSULTS:  ID, orthopedic surgery and Neurosurgery  Time spent: 25- minutes-Greater than 50% of this time was spent in counseling, explanation of diagnosis, planning of further management, and coordination of care.  MEDICATIONS: Scheduled Meds:  sodium chloride   Intravenous Once   sodium chloride   Intravenous Once   acetaminophen  1,000 mg Oral TID   benzylpenicilloyl polylysine  0.1 mL Intradermal Once   benzylpenicilloyl polylysine  0.1 mL Intradermal Once   benzylpenicilloyl polylysine  0.06 mL Topical Once   Chlorhexidine Gluconate Cloth  6 each Topical Daily   enoxaparin (LOVENOX) injection  0.5 mg/kg Subcutaneous Q24H   gabapentin  300 mg Oral TID   histamine phosphate  0.1 mL Topical Once   hydrALAZINE  25 mg Oral BID   influenza vac split quadrivalent PF  0.5 mL Intramuscular Tomorrow-1000   insulin aspart  0-15 Units Subcutaneous TID WC   insulin aspart  0-5 Units Subcutaneous QHS   insulin aspart  8 Units Subcutaneous TID WC   insulin NPH Human  28 Units Subcutaneous BID AC & HS   iron polysaccharides  150 mg Oral Daily   methocarbamol  750 mg Oral TID   metoCLOPramide  10 mg Oral TID AC & HS   penicillin G in sodium chloride 10,000 units/mL vial for skin test  1,000 Units Intradermal Once   penicillin G in sodium chloride 10,000 units/mL vial for skin test  1,000 Units Intradermal Once   penicillin G in sodium chloride 10,000 units/mL vial for skin test  1,000 Units Topical Once   polyethylene glycol  17 g Oral BID   senna-docusate  1 tablet Oral BID   sodium chloride flush  0.1 mL Topical Once   sodium chloride flush  0.1 mL Intradermal Once   sodium chloride flush  10-40 mL Intracatheter Q12H   Continuous Infusions:   ceFAZolin (ANCEF) IV 2 g  (08/01/19 1048)   PRN Meds:.albuterol, diphenhydrAMINE, fentaNYL (SUBLIMAZE) injection, hydrALAZINE, ondansetron **OR** ondansetron (ZOFRAN) IV, oxyCODONE, sodium chloride flush, zolpidem   PHYSICAL EXAM: Vital signs: Vitals:   07/31/19 2027 07/31/19 2348 08/01/19 0751 08/01/19 1140  BP:   108/71 103/65  Pulse: 99     Resp:   (!) 22   Temp: 98.2 F (36.8 C) 98.1 F (36.7 C) 98.7 F (37.1 C) 98.2 F (36.8 C)  TempSrc: Oral Oral Oral Oral  SpO2: 95%     Weight:      Height:       Filed Weights   07/05/19 1807 07/06/19 2116 07/27/19 1113  Weight: (!) 167.4 kg (!) 164 kg (!) 164 kg   Body mass index is 41.78 kg/m.   Gen Exam:Alert awake-not in any distress HEENT:atraumatic, normocephalic Chest: B/L clear to auscultation anteriorly CVS:S1S2 regular Abdomen:soft non tender, non distended Extremities:no edema Neurology: Non focal Skin: no rash  I have personally reviewed following labs and imaging studies  LABORATORY DATA: CBC: Recent Labs  Lab 07/28/19 0553 07/29/19 0644 07/29/19 1801 07/30/19 0550 07/31/19 0745 07/31/19 2158 08/01/19 0547  WBC 8.8 6.4  --  6.5 5.5  --  9.5  NEUTROABS 6.1 4.0  --  4.4 3.4  --  7.0  HGB 7.3* 6.7* 7.2* 7.4* 6.9* 7.4* 7.7*  HCT 22.6* 20.3* 22.1* 23.2* 21.5* 22.8* 23.9*  MCV 93.8 93.5  --  94.3 96.0  --  96.8  PLT 263 267  --  299 299  --  [id number]    Basic Metabolic Panel: Recent Labs  Lab 07/28/19 0553 07/29/19 0644 07/30/19 0550 07/31/19 0745 08/01/19 0547  NA 130* 134* 133* 134* 133*  K 4.1 3.6 3.6 3.8 3.5  CL 91* 91* 93* 94* 95*  CO2 30 31 31 29 29   GLUCOSE 161* 109* 133* 183* 92  BUN 5* <5* <5* <5* 5*  CREATININE 0.60* 0.49* 0.51* 0.55* 0.61  CALCIUM 8.4* 8.6* 8.5* 8.6* 8.1*    GFR: Estimated Creatinine Clearance: 188.1 mL/min (by C-G formula based on SCr of 0.61 mg/dL).  Liver Function Tests: No results for input(s): AST, ALT, ALKPHOS, BILITOT, PROT, ALBUMIN in the last 168 hours. No results for input(s):  LIPASE, AMYLASE in  the last 168 hours. No results for input(s): AMMONIA in the last 168 hours.  Coagulation Profile: No results for input(s): INR, PROTIME in the last 168 hours.  Cardiac Enzymes: No results for input(s): CKTOTAL, CKMB, CKMBINDEX, TROPONINI in the last 168 hours.  BNP (last 3 results) No results for input(s): PROBNP in the last 8760 hours.  HbA1C: No results for input(s): HGBA1C in the last 72 hours.  CBG: Recent Labs  Lab 07/31/19 1227 07/31/19 1608 07/31/19 2157 08/01/19 0749 08/01/19 1135  GLUCAP 202* 100* 134* 126* 142*    Lipid Profile: No results for input(s): CHOL, HDL, LDLCALC, TRIG, CHOLHDL, LDLDIRECT in the last 72 hours.  Thyroid Function Tests: No results for input(s): TSH, T4TOTAL, FREET4, T3FREE, THYROIDAB in the last 72 hours.  Anemia Panel: No results for input(s): VITAMINB12, FOLATE, FERRITIN, TIBC, IRON, RETICCTPCT in the last 72 hours.  Urine analysis:    Component Value Date/Time   COLORURINE YELLOW 07/06/2019 [place] 07/06/2019 0437   LABSPEC 1.024 07/06/2019 0437   PHURINE 5.0 07/06/2019 0437   GLUCOSEU >=500 (A) 07/06/2019 0437   HGBUR MODERATE (A) 07/06/2019 0437   BILIRUBINUR NEGATIVE 07/06/2019 0437   KETONESUR 80 (A) 07/06/2019 0437   PROTEINUR NEGATIVE 07/06/2019 0437   NITRITE NEGATIVE 07/06/2019 0437   LEUKOCYTESUR NEGATIVE 07/06/2019 0437    Sepsis Labs: Lactic Acid, Venous    Component Value Date/Time   LATICACIDVEN 2.6 ([place]) 07/06/2019 2213    MICROBIOLOGY: Recent Results (from the past 240 hour(s))  Aerobic/Anaerobic Culture (surgical/deep wound)     Status: None (Preliminary result)   Collection Time: 07/27/19 12:56 PM   Specimen: PATH Other; Tissue  Result Value Ref Range Status   Specimen Description ABSCESS RIGHT THIGH  Final   Special Requests NONE  Final   Gram Stain   Final    NO WBC SEEN NO ORGANISMS SEEN Performed at [facility], [place] [address], [place], [place]  [address]    Culture   Final    FEW STAPHYLOCOCCUS AUREUS NO ANAEROBES ISOLATED; CULTURE IN PROGRESS FOR 5 DAYS    Report Status PENDING  Incomplete   Organism ID, Bacteria STAPHYLOCOCCUS AUREUS  Final      Susceptibility   Staphylococcus aureus - MIC*    CIPROFLOXACIN <=0.5 SENSITIVE Sensitive     ERYTHROMYCIN >=8 RESISTANT Resistant     GENTAMICIN <=0.5 SENSITIVE Sensitive     OXACILLIN <=0.25 SENSITIVE Sensitive     TETRACYCLINE >=16 RESISTANT Resistant     VANCOMYCIN 1 SENSITIVE Sensitive     TRIMETH/SULFA <=10 SENSITIVE Sensitive     CLINDAMYCIN RESISTANT Resistant     RIFAMPIN <=0.5 SENSITIVE Sensitive     Inducible Clindamycin POSITIVE Resistant     * FEW STAPHYLOCOCCUS AUREUS    RADIOLOGY STUDIES/RESULTS: Dg Abd 1 View  Result Date: 07/15/2019 CLINICAL DATA:  Abdominal pain. EXAM: ABDOMEN - 1 VIEW COMPARISON:  07/12/2019 FINDINGS: Nondistended gas-filled loops of small bowel and colon noted. Nondistended gas-filled stomach is present. No suspicious calcifications are present. Stool in the colon is present. IMPRESSION: Nonspecific nonobstructive bowel gas pattern with nondistended gas-filled stomach, small bowel and colon. Electronically Signed   By: Margarette Canada M.D.   On: 07/15/2019 17:02   Dg Abd 1 View  Result Date: 07/12/2019 CLINICAL DATA:  NG tube placement EXAM: ABDOMEN - 1 VIEW COMPARISON:  None. FINDINGS: Nasogastric tube tip and side port project over the stomach. Lung bases are clear. No dilated bowel is visible. IMPRESSION:  NG tube tip and side port project over the stomach. Electronically Signed   By: Ulyses Jarred M.D.   On: 07/12/2019 00:33   Dg Abd 1 View  Result Date: 07/11/2019 CLINICAL DATA:  Nausea and vomiting with abdominal pain EXAM: ABDOMEN - 1 VIEW COMPARISON:  07/06/2019 FINDINGS: Marked gaseous dilatation of the stomach. Otherwise nonobstructed gas pattern with gas in the small and large bowel. Findings could be secondary to enteritis or mild ileus.  IMPRESSION: 1. Incompletely visualized marked gaseous dilatation of stomach, consider NG tube decompression. Outlet obstruction could be considered. 2. Remainder of the gas pattern shows air-filled small and large bowel in a nonobstructed pattern, question enteritis or mild ileus. Electronically Signed   By: Donavan Foil M.D.   On: 07/11/2019 21:58   Mr Lumbar Spine W Wo Contrast  Result Date: 07/06/2019 CLINICAL DATA:  Infection.  Rule out epidural abscess. EXAM: MRI LUMBAR SPINE WITHOUT AND WITH CONTRAST TECHNIQUE: Multiplanar and multiecho pulse sequences of the lumbar spine were obtained without and with intravenous contrast. CONTRAST:  10 mL Gadovist IV COMPARISON:  CT abdomen pelvis in CT lumbar spine 07/06/2019 FINDINGS: Segmentation:  Normal Alignment:  Mild retrolisthesis L1-2 and L4-5 and L5-S1. Vertebrae: Negative for vertebral fracture or mass. No vertebral edema or abnormal enhancement. No evidence of discitis. Conus medullaris and cauda equina: Conus extends to the L1-2 level. Conus and cauda equina appear normal. Paraspinal and other soft tissues: Large enhancing fluid collection in the right paraspinous muscle at the L4 level measures 5.5 x 6 cm compatible with abscess. Adjacent facet joints show mild degeneration but no definite infection. Multilocular large fluid collection in the right iliopsoas muscle compatible with abscess. This measures at least 5 x 6 cm. Large multilocular fluid collection in the right gluteus muscle measures at least 5 cm. Abscess in the iliopsoas and gluteus muscle appear to communicate through the sciatic notch. Disc levels: L1-2: Disc and facet degeneration. Shallow left-sided disc protrusion with mild subarticular stenosis on the left. L2-3: Negative L3-4: Normal disc space. Small epidural abscess posteriorly on the left. Bilateral facet degeneration without evidence of facet joint infection. L4-5: Mild retrolisthesis. Disc degeneration and facet degeneration. Small  extraforaminal disc protrusion on the left. Epidural abscess posteriorly on the left compressing the thecal sac and contributing to moderate spinal stenosis. No evidence of facet joint infection. L5-S1: Disc degeneration with left-sided disc protrusion. Posterior epidural abscess is ill-defined but is contributing to compression of thecal sac. There is enhancement and fluid along the right S1 nerve root which may be the route of spread to the epidural space. No evidence of facet infection. IMPRESSION: 1. Multiple areas of infection. Large areas of abscess formation in the right paraspinous muscle at the L4 level. Large abscess in the right iliopsoas muscle and also in the right gluteus muscle. 2. 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 This may have entered the spinal canal via the right S1 nerve root. No evidence of discitis or facet joint infection. Electronically Signed   By: Franchot Gallo M.D.   On: 07/06/2019 13:34   Mr Pelvis W Wo Contrast  Result Date: 07/06/2019 CLINICAL DATA:  Right-sided low back pain for several weeks. No recent injury. Recent antibiotic therapy for urinary tract infection. EXAM: MRI PELVIS WITHOUT AND WITH CONTRAST TECHNIQUE: Multiplanar multisequence MR imaging of the pelvis was performed both before and after administration of intravenous contrast. CONTRAST:  10 cc Gadavist COMPARISON:  Abdominopelvic CT 07/06/2019 and 06/18/2019. FINDINGS:  Urinary Tract: The visualized distal ureters and bladder appear unremarkable. Bowel: No bowel wall thickening, distention or surrounding inflammation identified within the pelvis. Vascular/Lymphatic: No enlarged pelvic lymph nodes identified. No significant vascular findings. Reproductive: There are areas T2 hyperintensity and decreased enhancement centrally in the prostate gland bilaterally which may be secondary to benign prosthetic hypertrophy or prostatitis. There is no surrounding inflammatory change. The seminal vesicles  appear normal. Other: No ascites. Musculoskeletal: As demonstrated on earlier CT scan, there are multiple complex fluid collections surrounding the right sacroiliac joint. These demonstrate internal air bubbles, heterogeneity and irregular peripheral enhancement. Dominant component laterally in the gluteus musculature measures up to 9.8 x 6.1 cm on image 18/13. There is a component in the right issue femoral fossa measuring up to 5.2 cm on image 27/3. This demonstrates inferior extension around the right common hamstring tendon. There are intrapelvic components extending through the greater sciatic foramen, involving the right piriformis muscle and measuring up to 6.3 cm on image 13/12. There are also irregular components within the right iliopsoas muscle measuring up to 3.8 cm on image 9/13. There are right posterior paraspinal components within the erector spinae musculature, measuring up to 6.2 cm on image 1/13. There is mild T2 hyperintensity and enhancement within the right sacroiliac joint, with associated marrow edema and enhancement in the adjacent right sacral ala. T2 hyperintensity and enhancement are also present within the left proximal common hamstring muscle. There are no focal fluid collections in the left pelvis. No significant hip joint effusion or abnormal synovial enhancement. IMPRESSION: 1. As seen on earlier CT, there are multiple complex fluid collections in the right pelvis with internal air and peripheral enhancement consistent with multifocal abscesses. 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 2. Inflammation extends into the erector spinae musculature on the right. Lumbar spine findings are dictated separately. 3. Mild T2 hyperintensity within the proximal left common hamstring muscle. 4. Central heterogeneity within the prostate gland is  removed from these other findings and probably due to BPH. Prostatitis could have this appearance. Electronically Signed   By: Richardean Sale M.D.   On: 07/06/2019 13:32   Ct Abdomen Pelvis W Contrast  Addendum Date: 07/06/2019   ADDENDUM REPORT: 07/06/2019 08:20 ADDENDUM: Original report by Dr. Golden Circle. Addendum by Dr. Jeralyn Ruths for contrast extravasation documentation: Type of contrast:  Isovue 300 Site of extravasation: Right AC Estimated volume of extravasation: 40 ml Area of extravasation scanned with CT? no PATIENT'S SIGNS AND SYMPTOMS Skin blistering/ulceration: no Decrease capillary refill: no Change in skin color: no Decreased motor function or severe tightness: no Decreased pulses distal to site of extravasation: no Altered sensation: no Increasing pain or signs of increased swelling during observation: no TREATMENT An ice pack was applied with instructions given to the patient for limb elevation and warning signs to look for. The patient was returned to the emergency department. Electronically Signed   By: Logan Bores M.D.   On: 07/06/2019 08:20   Result Date: 07/06/2019 CLINICAL DATA:  Right-sided pain for several weeks, initial encounter EXAM: CT ABDOMEN AND PELVIS WITH CONTRAST TECHNIQUE: Multidetector CT imaging of the abdomen and pelvis was performed using the standard protocol following bolus administration of intravenous contrast. CONTRAST:  162mL ISOVUE-300 IOPAMIDOL (ISOVUE-300) INJECTION 61% COMPARISON:  06/18/2019 FINDINGS: Lower chest: Lung bases are well aerated bilaterally. There is a vague nodular density identified on the  first image within the right middle lobe. This area was not covered on the prior exam. Hepatobiliary: Liver is diffusely decreased in attenuation consistent with fatty infiltration. The gallbladder is decompressed. Scattered calcifications are noted within consistent with stones. No obstructive changes are noted. Pancreas: Unremarkable. No pancreatic ductal dilatation or  surrounding inflammatory changes. Spleen: Normal in size without focal abnormality. Adrenals/Urinary Tract: Adrenal glands are within normal limits. The kidneys are well visualized with renal calculi stable in appearance from the prior exam. The largest of these lies in the lower pole measuring approximately 9 mm. The left kidney demonstrates no renal calculi. Very mild prominence of the collecting system and ureter on the right are seen similar to that noted on the prior exam without obstructing lesion. The bladder is well distended. Stomach/Bowel: No obstructive or inflammatory changes of the colon are seen. The appendix is not well visualized. No obstructive or inflammatory changes of the small bowel are seen. The stomach is unremarkable. Vascular/Lymphatic: No significant vascular findings are present. No enlarged abdominal or pelvic lymph nodes. Reproductive: Prostate is unremarkable. Other: No abdominal wall hernia or abnormality. No abdominopelvic ascites. Musculoskeletal: There are changes within the right buttock, right iliac fossa and perispinal musculature on the right with fluid collections and air foci identified consistent with localized infection. The fluid collection within the right paraspinal musculature measures 5.6 x 5.5 cm consistent with a focal abscess. Similar extension into the iliac fossa on the right as well as the musculature of the right buttocks is seen. Soft tissue density in the presacral region and along the right pelvic wall are noted consistent with focal localized infection as well. IMPRESSION: Changes consistent with localized abscess involving the right iliac fossa, musculature of the right buttocks and right paraspinal musculature with extension into the lateral aspect of the pelvis on the right. Percutaneous drainage of the paraspinal collection may be helpful. These changes are all new from the prior exam. Stable renal calculi on the right. Stable cholelithiasis and fatty  infiltration of the liver. Electronically Signed: By: Inez Catalina M.D. On: 07/06/2019 07:55   Ct Hip Right Wo Contrast  Result Date: 07/30/2019 CLINICAL DATA:  Right hip abscess. EXAM: CT OF THE RIGHT HIP WITHOUT CONTRAST TECHNIQUE: Multidetector CT imaging of the right hip was performed according to the standard protocol. Multiplanar CT image reconstructions were also generated. COMPARISON:  None. FINDINGS: Bones/Joint/Cartilage Widening of the right sacroiliac joint with erosive changes most concerning for septic arthritis. Nondisplaced fracture of the inferior aspect of the right sacrum adjacent to the sacroiliac joint. Subchondral fracture involving the right ilium along the right SI joint. Small amount air along the anterior margin of the right sacroiliac joint within the iliopsoas muscle. No acute fracture or dislocation.  Normal alignment. Ligaments Ligaments are suboptimally evaluated by CT. Soft tissue, Muscles and Tendons Soft tissue wound overlying the right greater trochanter with air within the subcutaneous fat. There has been significant interval improvement in the complex fluid collection involving the right gluteal musculature extending into the pelvis. Small amount air in the left semimembranosus muscle partially visualized. No fluid collection or hematoma.  No soft tissue mass. IMPRESSION: 1. 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 Interval development of a subchondral fracture involving the right ilium and a nondisplaced fracture along the inferior aspect of the right sacrum. 2. Soft tissue wound overlying the right greater trochanter with air within the subcutaneous fat likely postsurgical. Significant interval improvement in the complex fluid collection involving the right  gluteal musculature extending into the pelvis. 3. Small amount air in the left semimembranosus muscle partially visualized most concerning for persistent infection in the absence recent instrumentation  involving this area. Electronically Signed   By: Kathreen Devoid   On: 07/30/2019 16:56   Mr Tibia Fibula Right W Wo Contrast  Result Date: 07/26/2019 CLINICAL DATA:  drainage posteromedial R knee, eval for abscess. Contacted by nurse regarding new drainage from posteromedial proximal lower R leg. Started this am Pt notes pain only when touching areaNo other concerns elsewhere Currently on ancef for MSSA from R gluteal/hip abscess EXAM: MRI OF LOWER RIGHT EXTREMITY WITHOUT AND WITH CONTRAST TECHNIQUE: Multiplanar, multisequence MR imaging of the right lower extremity was performed both before and after administration of intravenous contrast. CONTRAST:  13mL GADAVIST GADOBUTROL 1 MMOL/ML IV SOLN COMPARISON:  None. FINDINGS: Soft tissues: Large complex fluid collection extends from the thigh across the posterior knee. It begins above the included field of view and thus is entire extent is not imaged on this exam. The collection terminates in the posterosuperior calf, below the knee. There is peripheral wall enhancement and septal enhancement throughout the collection. Small foci of signal void are noted within the fluid collection consistent with bubbles of air. Transversely, the collection measures a maximum of 11 x 9 cm. The collection is at least 27 cm from superior to inferior, but extends above the included field of view and therefore is longer from superior to inferior. The collection is centered on the hamstrings. The neurovascular structures are displaced anteriorly. There is diffuse surrounding soft tissue edema/cellulitis. Osseous structures: Normal signal. No evidence of edema or osteomyelitis. Right knee joint is normally spaced and aligned. No joint effusion. Visualized tendons are intact, normal in signal. IMPRESSION: 1. Large complex collection consistent with an abscess extending throughout the entire visible posterior thigh, centered on the posterior compartment involving the hamstring musculature.  The collection measures 11 x 9 cm transversely. It extends through the visualized thigh across the knee to the upper leg. There is surrounding soft tissue edema/cellulitis. 2. No evidence of osteomyelitis. Knee joint is unremarkable with no effusion to suggest septic arthritis. Electronically Signed   By: Lajean Manes M.D.   On: 07/26/2019 21:17   Ct Aspiration  Result Date: 07/06/2019 INDICATION: 48 year old with multiple sites of infection including a lumbar paraspinal muscular abscess. Plan for CT-guided aspiration of the paraspinal abscess collection. EXAM: CT-GUIDED ASPIRATION OF PARASPINAL ABSCESS MEDICATIONS: Moderate sedation ANESTHESIA/SEDATION: Fentanyl 100 mcg IV; Versed 2.0 mg IV Moderate Sedation Time:  10 minutes The patient was continuously monitored during the procedure by the interventional radiology nurse under my direct supervision. COMPLICATIONS: None immediate. PROCEDURE: Informed written consent was obtained from the patient after a thorough discussion of the procedural risks, benefits and alternatives. All questions were addressed. A timeout was performed prior to the initiation of the procedure. Patient was placed prone on the CT scanner. CT images through the lower abdomen were obtained. The right paraspinal muscular abscess was identified at the L4-L5 level. Overlying skin was prepped with chlorhexidine. Sterile field was created. Skin was anesthetized with 1% lidocaine. Yueh catheter was directed into the abscess collection with CT guidance. Approximately 15 mL of brown purulent fluid was aspirated. No additional fluid could be aspirated. At this point in the procedure the patient was in severe pain from lying prone and the procedure was stopped. Bandage placed over the puncture site. FINDINGS: Low-density abscess in the right paraspinal musculature at L4-L5. 15 mL of  brown purulent fluid was removed from this collection. IMPRESSION: CT-guided aspiration of the right paraspinal abscess  at L4-L5. 15 mL of purulent fluid was removed and sent for culture. Electronically Signed   By: Markus Daft M.D.   On: 07/06/2019 19:22   Ct L-spine No Charge  Result Date: 07/06/2019 CLINICAL DATA:  Right-sided back pain and right lower extremity pain for 3 weeks. EXAM: CT LUMBAR SPINE WITHOUT CONTRAST TECHNIQUE: Multidetector CT imaging of the lumbar spine was performed without intravenous contrast administration. Multiplanar CT image reconstructions were also generated. COMPARISON:  None. FINDINGS: Segmentation: 5 lumbar type vertebrae. Alignment: Normal. Vertebrae: No acute fracture or focal pathologic process. Paraspinal and other soft tissues: There is gas in the paraspinal soft tissues with a 5.6 cm fluid collection in the right multifidus muscle at the L4-5 level. There is gas in the right iliacus and psoas muscles and right gluteus minimus and medius muscles with expansion of those muscles with low-density consistent with abscess. There is also fluid and abnormal soft tissue in the presacral space extending through the right sciatic notch. There is a tiny bubble of air in the right hip joint. Cholelithiasis. 5 mm stone in the mid right kidney. Small right inguinal hernia containing only fat. Disc levels: T7-8 through L3-4: No appreciable disc protrusions. The discs at T9-10 and T11-12 are degenerated with vacuum phenomena. L4-5: Small broad-based disc bulge. Slight hypertrophy of the left ligamentum flavum creates narrowing of the spinal canal. The CT thecal sac appears compressed in the transverse dimension. L5-S1: Disc space narrowing asymmetric disc osteophyte complex to the left of midline. There appears to be abnormal soft tissue in the spinal canal at S1. Given the adjacent soft tissue infection, the possibility of infection in the spinal canal should be considered. MRI of the lower abdomen and pelvis and lumbar spine recommended for further evaluation of the findings to determine whether there is  epidural extension of infection. IMPRESSION: 1. Extensive soft tissue infection in bulging the right paraspinal muscles, right gluteal muscles, presacral space, and the soft tissues of the right sciatic notch. There is a definable abscess in the right multifidus muscle. 2. Subtle increased soft tissue in the spinal canal at S1. Asymmetric compression of the thecal sac at L4-5. The possibility of infection within the spinal canal should be considered. 3. MRI of the lumbar spine with and without contrast and MRI of the lower abdomen and pelvis with and without contrast recommended for further evaluation. Critical Value/emergent results were called by telephone at the time of interpretation on 07/06/2019 at 8:17 am to the attending ER physician , who verbally acknowledged these results. Electronically Signed   By: Lorriane Shire M.D.   On: 07/06/2019 08:16   Dg Chest Portable 1 View  Result Date: 07/06/2019 CLINICAL DATA:  Shortness of breath. EXAM: PORTABLE CHEST 1 VIEW COMPARISON:  None. FINDINGS: Mild cardiomegaly. Normal mediastinal contours. No focal airspace disease, pleural effusion, or pneumothorax. No pulmonary edema. No acute osseous abnormalities. IMPRESSION: Mild cardiomegaly. No acute chest findings. Electronically Signed   By: Keith Rake M.D.   On: 07/06/2019 04:10   Vas [place] Lower Extremity Venous (dvt)  Result Date: 08/01/2019  Lower Venous Study Indications: Follow up dvt.  Limitations: Bandages and open wound. Comparison Study: previous study done 9/25 Performing Technologist: Abram Sander RVS  Examination Guidelines: A complete evaluation includes B-mode imaging, spectral Doppler, color Doppler, and power Doppler as needed of all accessible portions of each vessel. Bilateral testing is considered  an integral part of a complete examination. Limited examinations for reoccurring indications may be performed as noted.   +---------+---------------+---------+-----------+----------+-----------------+  RIGHT     Compressibility Phasicity Spontaneity Properties Thrombus Aging     +---------+---------------+---------+-----------+----------+-----------------+  CFV       Full            Yes       Yes                                       +---------+---------------+---------+-----------+----------+-----------------+  SFJ       Full                                                                +---------+---------------+---------+-----------+----------+-----------------+  FV Prox   Full                                                                +---------+---------------+---------+-----------+----------+-----------------+  FV Mid    Full                                                                +---------+---------------+---------+-----------+----------+-----------------+  FV Distal Full                                                                +---------+---------------+---------+-----------+----------+-----------------+  POP                                                        Not visualized     +---------+---------------+---------+-----------+----------+-----------------+  PTV       None                                             Age Indeterminate  +---------+---------------+---------+-----------+----------+-----------------+  PERO                                                       Not visualized     +---------+---------------+---------+-----------+----------+-----------------+   +----+---------------+---------+-----------+----------+--------------+  LEFT Compressibility Phasicity Spontaneity Properties Thrombus Aging  +----+---------------+---------+-----------+----------+--------------+  CFV  Full            Yes  Yes                                    +----+---------------+---------+-----------+----------+--------------+     Summary: Right: Findings consistent with age indeterminate deep vein thrombosis  involving the right posterior tibial veins. No cystic structure found in the popliteal fossa. Left: No evidence of common femoral vein obstruction.  *See table(s) above for measurements and observations.    Preliminary    Vas [place] Lower Extremity Venous (dvt)  Result Date: 07/28/2019  Lower Venous Study Indications: Edema, abcess right proximal calf, and Pain.  Comparison Study: 07/11/19 RLE negative Performing Technologist: June Leap RDMS, RVT  Examination Guidelines: A complete evaluation includes B-mode imaging, spectral Doppler, color Doppler, and power Doppler as needed of all accessible portions of each vessel. Bilateral testing is considered an integral part of a complete examination. Limited examinations for reoccurring indications may be performed as noted.  +---------+---------------+---------+-----------+----------+--------------+  RIGHT     Compressibility Phasicity Spontaneity Properties Thrombus Aging  +---------+---------------+---------+-----------+----------+--------------+  CFV       Full            Yes       Yes                                    +---------+---------------+---------+-----------+----------+--------------+  SFJ       Full                                                             +---------+---------------+---------+-----------+----------+--------------+  FV Prox   Full                                                             +---------+---------------+---------+-----------+----------+--------------+  FV Mid    Full                                                             +---------+---------------+---------+-----------+----------+--------------+  FV Distal Full                                                             +---------+---------------+---------+-----------+----------+--------------+  PFV       Full                                                             +---------+---------------+---------+-----------+----------+--------------+  POP  Not visualized  +---------+---------------+---------+-----------+----------+--------------+  PTV       None                                             Acute           +---------+---------------+---------+-----------+----------+--------------+  PERO      Full                                                             +---------+---------------+---------+-----------+----------+--------------+   +----+---------------+---------+-----------+----------+--------------+  LEFT Compressibility Phasicity Spontaneity Properties                 +----+---------------+---------+-----------+----------+--------------+  CFV                                                   Not visualized  +----+---------------+---------+-----------+----------+--------------+     Summary: Right: Findings consistent with acute deep vein thrombosis involving the right posterior tibial veins.  *See table(s) above for measurements and observations. Electronically signed by Curt Jews MD on 07/28/2019 at 4:04:40 AM.    Final    Vas [place] Lower Extremity Venous (dvt)  Result Date: 07/09/2019  Lower Venous Study Indications: Pain.  Risk Factors: Abscess of spinal canal, right gluteal muscle and iliopsoas muscle. Comparison Study: No prior study on file Performing Technologist: Sharion Dove RVS  Examination Guidelines: A complete evaluation includes B-mode imaging, spectral Doppler, color Doppler, and power Doppler as needed of all accessible portions of each vessel. Bilateral testing is considered an integral part of a complete examination. Limited examinations for reoccurring indications may be performed as noted.  +---------+---------------+---------+-----------+----------+--------------+  RIGHT     Compressibility Phasicity Spontaneity Properties Thrombus Aging  +---------+---------------+---------+-----------+----------+--------------+  CFV       Full            Yes       Yes                                     +---------+---------------+---------+-----------+----------+--------------+  SFJ       Full                                                             +---------+---------------+---------+-----------+----------+--------------+  FV Prox   Full                                                             +---------+---------------+---------+-----------+----------+--------------+  FV Mid    Full                                                             +---------+---------------+---------+-----------+----------+--------------+  FV Distal Full                                                             +---------+---------------+---------+-----------+----------+--------------+  PFV       Full                                                             +---------+---------------+---------+-----------+----------+--------------+  POP       Full            Yes       Yes                                    +---------+---------------+---------+-----------+----------+--------------+  PTV       Full                                                             +---------+---------------+---------+-----------+----------+--------------+  PERO      Full                                                             +---------+---------------+---------+-----------+----------+--------------+   +----+---------------+---------+-----------+----------+--------------+  LEFT Compressibility Phasicity Spontaneity Properties Thrombus Aging  +----+---------------+---------+-----------+----------+--------------+  CFV  Full            Yes       Yes                                    +----+---------------+---------+-----------+----------+--------------+     Summary: Right: There is no evidence of deep vein thrombosis in the lower extremity. Left: No evidence of common femoral vein obstruction.  *See table(s) above for measurements and observations. Electronically signed by Harold Barban MD on 07/09/2019 at 1:13:14 AM.    Final    [place] Ekg Site  Rite  Result Date: 07/14/2019 If Site Rite image not attached, placement could not be confirmed due to current cardiac rhythm.    LOS: 26 days   Oren Binet, MD  [facility]  If 7PM-7AM, please contact night-coverage  Please page via [email or web address]  Go to [email or web address] and use 's universal password to access. If you do not have the password, please contact the hospital operator.  Locate the [facility] provider you are looking for under [facility] and page to a number that you can be directly reached. If you still have difficulty reaching the provider, please page the [facility] (Director on Call) for the Hospitalists listed on amion for assistance.  08/01/2019, 1:14 PM

## 2019-08-01 NOTE — Progress Notes (Addendum)
[facility] for Infectious Disease   Reason for visit: Follow up on MSSA bacteremia/disseminated infection  Interval History: just back from the OR for further debridement.  WBC wnl, afebrile.  No associated rash or diarrhea.  No acute events.     Physical Exam: Constitutional:  Vitals:   08/01/19 0751 08/01/19 1140  BP: 108/71 103/65  Pulse:    Resp: (!) 22   Temp: 98.7 F (37.1 C) 98.2 F (36.8 C)  SpO2:     patient appears in some distress with pain Eyes: anicteric Respiratory: Normal respiratory effort; CTA B Cardiovascular: RRR GI: soft, nt, nd Skin: no rashes  Review of Systems: Constitutional: negative for fevers and chills Gastrointestinal: negative for nausea and diarrhea Integument/breast: negative for rash  Lab Results  Component Value Date   WBC 9.5 08/01/2019   HGB 7.7 (L) 08/01/2019   HCT 23.9 (L) 08/01/2019   MCV 96.8 08/01/2019   PLT 302 08/01/2019    Lab Results  Component Value Date   CREATININE 0.61 08/01/2019   BUN 5 (L) 08/01/2019   NA 133 (L) 08/01/2019   K 3.5 08/01/2019   CL 95 (L) 08/01/2019   CO2 29 08/01/2019    Lab Results  Component Value Date   ALT 16 07/20/2019   AST 20 07/20/2019   ALKPHOS 70 07/20/2019     Microbiology: Recent Results (from the past 240 hour(s))  Aerobic/Anaerobic Culture (surgical/deep wound)     Status: None   Collection Time: 07/27/19 12:56 PM   Specimen: PATH Other; Tissue  Result Value Ref Range Status   Specimen Description ABSCESS RIGHT THIGH  Final   Special Requests NONE  Final   Gram Stain NO WBC SEEN NO ORGANISMS SEEN   Final   Culture   Final    FEW STAPHYLOCOCCUS AUREUS NO ANAEROBES ISOLATED Performed at [facility], [address] [place], [place] [address]    Report Status 08/01/2019 FINAL  Final   Organism ID, Bacteria STAPHYLOCOCCUS AUREUS  Final      Susceptibility   Staphylococcus aureus - MIC*    CIPROFLOXACIN <=0.5 SENSITIVE Sensitive     ERYTHROMYCIN >=8  RESISTANT Resistant     GENTAMICIN <=0.5 SENSITIVE Sensitive     OXACILLIN <=0.25 SENSITIVE Sensitive     TETRACYCLINE >=16 RESISTANT Resistant     VANCOMYCIN 1 SENSITIVE Sensitive     TRIMETH/SULFA <=10 SENSITIVE Sensitive     CLINDAMYCIN RESISTANT Resistant     RIFAMPIN <=0.5 SENSITIVE Sensitive     Inducible Clindamycin POSITIVE Resistant     * FEW STAPHYLOCOCCUS AUREUS    Impression/Plan:  1. Disseminated infection, MSSA - OPAT ORDERS:  Diagnosis: Lumbar spine discitis with epidural abscess, right hip/gluteal and right thigh abscess (last surgery 9/29)  Culture Result: MSSA in all cultures   Allergies  Allergen Reactions  . Penicillins Anaphylaxis    Did it involve swelling of the face/tongue/throat, SOB, or low BP? Yes Did it involve sudden or severe rash/hives, skin peeling, or any reaction on the inside of your mouth or nose? No Did you need to seek medical attention at a hospital or doctor's office? Yes When did it last happen?4 months old If all above answers are "NO", may proceed with cephalosporin use.   . Tramadol Anaphylaxis    Discharge antibiotics: Cefazolin 2gm IV q8h   Duration: 28 days   End Date: October 26th   [place] and Maintenance Per Protocol _x_ Please pull PIC at completion  of IV antibiotics __ Please leave PIC in place until doctor has seen patient or been notified  Labs weekly while on IV antibiotics: _x_ CBC with differential __ BMP __ BMP TWICE WEEKLY** _x_ CMP __ CRP __ ESR __ Vancomycin trough  Fax weekly labs to [phone]  Clinic Follow Up Appt: Dr. Linus Salmons 08/23/19  @ 9:30 a.m.   2.  Paraspinal abscess - debrided previously and on antibiotics as above.  3.  Medication monitoring - no leukopenia, thrombocytopenia.  Continue to monitor.   4. Penicillin Allergy Skin Testing - conducted today . Non-reactive to skin test and oral amoxicillin challenge arranged as well. Removed PCN allergy and counseled patient.     Janene Madeira, MSN, NP-C [facility] [email or web address] Pager: [phone] Office: [phone] [place]: [phone]   Patient was seen, examined,treatment plan was discussed with the  Advance Practice Provider.  I have personally reviewed the clinical findings, labs, imaging studies and management of this patient in detail.  I agree with the documentation, as recorded by the Advance Practice Provider.

## 2019-08-01 NOTE — Progress Notes (Signed)
Physical Therapy Treatment Patient Details Name: Bradley Cox MRN: [id number] DOB: [birth year] Today's Date: 08/01/2019    History of Present Illness Patient is a 48 y/o male who presents with right flank pain. Admitted with disseminated MSSA infection including bacteremia, Lumbosacral spinal epidural abscess, paraspinal/large gluteal/pelvic abscess with sciatica impingement. s/p I&D large gluteal abscess and neuroplasty of sciatica nerve 9/7. s/p CT aspiration right paraspinal abscess. PMH includes DM, HTN, morbid obesity, OSA.    PT Comments    Treatment session limited. Once sitting edge of bed, pt with syncopal episode and unresponsive for ~10 seconds. HR 100, BP 123/76. Returned quickly to supine and pt began conversating and did not recall episode; RN notified. Pt reporting being frustrated at not being able to walk. Will continue to progress as tolerated.     Follow Up Recommendations  CIR;Supervision/Assistance - 24 hour     Equipment Recommendations  Rolling walker with 5" wheels;Wheelchair (measurements PT);3in1 (PT)    Recommendations for Other Services       Precautions / Restrictions Precautions Precautions: Fall Precaution Comments: wound vac to R posterior thigh/behind knee Restrictions Weight Bearing Restrictions: Yes RLE Weight Bearing: Weight bearing as tolerated    Mobility  Bed Mobility Overal bed mobility: Needs Assistance Bed Mobility: Supine to Sit;Sit to Supine     Supine to sit: HOB elevated;Modified independent (Device/Increase time) Sit to supine: Max assist;+2 for physical assistance;HOB elevated   General bed mobility comments: pt MOD I for supine>sit with increased time and effort with use of bed features; MAX A +2 to lift BLEs back to supine d/t syncope episode  Transfers                 General transfer comment: unable to transfer OOB  Ambulation/Gait                 Stairs             Wheelchair Mobility     Modified Rankin (Stroke Patients Only)       Balance Overall balance assessment: Needs assistance Sitting-balance support: Bilateral upper extremity supported;Feet supported Sitting balance-Leahy Scale: Poor Sitting balance - Comments: reliant on BUE support in sitting to off set R LE pain                                    Cognition Arousal/Alertness: Awake/alert Behavior During Therapy: WFL for tasks assessed/performed Overall Cognitive Status: Within Functional Limits for tasks assessed                                 General Comments: pt continues to be discouraged about limited progress; MAX encouragment/ support provided      Exercises      General Comments        Pertinent Vitals/Pain Pain Assessment: Faces Faces Pain Scale: Hurts even more Pain Location: R LE with movement Pain Descriptors / Indicators: Grimacing;Discomfort;Operative site guarding Pain Intervention(s): Limited activity within patient's tolerance;Monitored during session;Repositioned    Home Living                      Prior Function            PT Goals (current goals can now be found in the care plan section) Acute Rehab PT Goals Patient Stated Goal: to walk down the hall Potential  to Achieve Goals: Good    Frequency    Min 3X/week      PT Plan Current plan remains appropriate    Co-evaluation PT/OT/SLP Co-Evaluation/Treatment: Yes Reason for Co-Treatment: Complexity of the patient's impairments (multi-system involvement);For patient/therapist safety;To address functional/ADL transfers PT goals addressed during session: Mobility/safety with mobility OT goals addressed during session: ADL's and self-care      AM-PAC PT "6 Clicks" Mobility   Outcome Measure  Help needed turning from your back to your side while in a flat bed without using bedrails?: None Help needed moving from lying on your back to sitting on the side of a flat bed  without using bedrails?: None Help needed moving to and from a bed to a chair (including a wheelchair)?: A Lot Help needed standing up from a chair using your arms (e.g., wheelchair or bedside chair)?: A Lot Help needed to walk in hospital room?: A Lot Help needed climbing 3-5 steps with a railing? : Total 6 Click Score: 15    End of Session   Activity Tolerance: Other (comment)(limited due to syncopal episode) Patient left: in bed;with call bell/phone within reach Nurse Communication: Other (comment)(syncopal episode) PT Visit Diagnosis: Other abnormalities of gait and mobility (R26.89);Muscle weakness (generalized) (M62.81);Pain;Difficulty in walking, not elsewhere classified (R26.2) Pain - Right/Left: Right Pain - part of body: Hip     Time: [id number] PT Time Calculation (min) (ACUTE ONLY): 23 min  Charges:  $Therapeutic Activity: 8-22 mins                     Ellamae Sia, PT, DPT Acute Rehabilitation Services Pager [phone] Office [phone]    Bradley Cox 08/01/2019, 5:21 PM

## 2019-08-01 NOTE — Progress Notes (Signed)
Occupational Therapy Treatment Patient Details Name: Bradley Cox MRN: [id number] DOB: [birth year] Today's Date: 08/01/2019    History of present illness Patient is a 48 y/o male who presents with right flank pain. Admitted with disseminated MSSA infection including bacteremia, Lumbosacral spinal epidural abscess, paraspinal/large gluteal/pelvic abscess with sciatica impingement. s/p I&D large gluteal abscess and neuroplasty of sciatica nerve 9/7. s/p CT aspiration right paraspinal abscess. PMH includes DM, HTN, morbid obesity, OSA.  Pt seen with PT for functional mobility progression. Pt MOD I for supine >sit with increased time and effort with use of bed features. While sitting EOB pt reports feeling "weird" and becoming unresponsive for ~ 10 secs. Pt MAX A +2 to return pt to supine. BP taken once supine- 123/76 with pt conversating and not remembering episode. Pt reports frustration with progress and expresses desire to be able to walk. Provided encouragement and support to pt and alerted nurse of syncope episode. DC plan remains appropriate. Will continue to follow for acute OT needs.   OT comments    Follow Up Recommendations  CIR;Supervision/Assistance - 24 hour    Equipment Recommendations  Other (comment)(tbd to next venue)    Recommendations for Other Services      Precautions / Restrictions Precautions Precautions: Fall Precaution Comments: wound vac to R posterior thigh/behind knee Restrictions Weight Bearing Restrictions: Yes RLE Weight Bearing: Weight bearing as tolerated       Mobility Bed Mobility Overal bed mobility: Needs Assistance Bed Mobility: Supine to Sit;Sit to Supine     Supine to sit: HOB elevated;Modified independent (Device/Increase time) Sit to supine: Max assist;+2 for physical assistance;HOB elevated   General bed mobility comments: pt MOD I for supine>sit with increased time and effort with use of bed features; MAX A +2 to lift BLEs back to  supine d/t syncope episode  Transfers                 General transfer comment: unable to transfer OOB    Balance Overall balance assessment: Needs assistance Sitting-balance support: Bilateral upper extremity supported;Feet supported Sitting balance-Leahy Scale: Poor Sitting balance - Comments: reliant on BUE support in sitting to off set R LE pain                                   ADL either performed or assessed with clinical judgement   ADL Overall ADL's : Needs assistance/impaired                           Toilet Transfer Details (indicate cue type and reason): unable to transfer d/t syncope episode at EOB           General ADL Comments: Pt continues to present with decreased functional performance and is limited by pain; pt with syncope episode at EOB reporting he feels "weird" pt briefly unresponsive for ~ 10 seconds; therapists assisted back to bed with BP taken ( 123/76) Pt continued to be frustrated with limited progress     Vision       Perception     Praxis      Cognition Arousal/Alertness: Awake/alert Behavior During Therapy: WFL for tasks assessed/performed Overall Cognitive Status: Within Functional Limits for tasks assessed  General Comments: pt continues to be discouraged about limited progress; MAX encouragment/ support provided        Exercises     Shoulder Instructions       General Comments      Pertinent Vitals/ Pain       Pain Assessment: Faces Faces Pain Scale: Hurts even more Pain Location: R LE with movement Pain Descriptors / Indicators: Grimacing;Discomfort;Operative site guarding Pain Intervention(s): Monitored during session;Repositioned  Home Living                                          Prior Functioning/Environment              Frequency  Min 3X/week        Progress Toward Goals  OT Goals(current goals can now  be found in the care plan section)  Progress towards OT goals: Progressing toward goals  Acute Rehab OT Goals Patient Stated Goal: to get back home OT Goal Formulation: With patient Time For Goal Achievement: 08/07/19 Potential to Achieve Goals: Good  Plan Discharge plan remains appropriate    Co-evaluation    PT/OT/SLP Co-Evaluation/Treatment: Yes Reason for Co-Treatment: Complexity of the patient's impairments (multi-system involvement);Necessary to address cognition/behavior during functional activity;For patient/therapist safety;To address functional/ADL transfers   OT goals addressed during session: ADL's and self-care      AM-PAC OT "6 Clicks" Daily Activity     Outcome Measure   Help from another person eating meals?: None Help from another person taking care of personal grooming?: A Little Help from another person toileting, which includes using toliet, bedpan, or urinal?: A Lot Help from another person bathing (including washing, rinsing, drying)?: A Lot Help from another person to put on and taking off regular upper body clothing?: A Lot Help from another person to put on and taking off regular lower body clothing?: Total 6 Click Score: 14    End of Session Equipment Utilized During Treatment: Rolling walker  OT Visit Diagnosis: Unsteadiness on feet (R26.81);Other abnormalities of gait and mobility (R26.89);Muscle weakness (generalized) (M62.81);Other symptoms and signs involving cognitive function;Pain   Activity Tolerance Treatment limited secondary to medical complications (Comment)(syncope)   Patient Left in bed;with call bell/phone within reach   Nurse Communication Mobility status;Other (comment)(syncope episode)        Time: [id number] OT Time Calculation (min): 22 min  Charges: OT General Charges $OT Visit: 1 Visit OT Treatments $Therapeutic Activity: 8-22 mins  [place] [place] [facility] [place] 08/01/2019, 4:57 PM

## 2019-08-01 NOTE — Progress Notes (Addendum)
Penicillin Skin Testing Assessment  Penicillin allergy skin testing completed on 08/01/2019  Penicillin allergy skin testing requested by: Infectious disease team  Patient history of penicillin or beta lactam allergy: When asked about his reaction to penicillin, Mr. Leaverton reports his reaction occurred when he was less than a year old. He was told he turned red all over his body and was unable to breath. This anaphylactic reaction, although concerning due to it's severity, is an IgE mediated reaction that happened about 50 years ago. It is very likely that Mr. Solar has outgrown his allergy by now, especially since he has been tolerating cefazolin as an inpatient for the past few weeks now. We will proceed with the penicillin skin test including the scratch and intradermal test. If these are both negative we will give him a one time dose of oral amoxicillin.   Last use of antihistamine (or other drug affecting response to histamine): He has not received any anti-histamines or steroids in the past 5 days.   Called the floor and spoke to the nurse to ask her to check on the patient 2-3 times during the hour after he took the amoxicillin. She reported PT came in the work with him after we had left and the patient had an episode of blacking out. He was reported to have a blood sugar of 80 and has been anemic requiring transfusions recently. There is low suspicion that this episode had anything to do with the amoxicillin challenge, and is more likely due to anemia and hypoglycemia. He does not have a rash anywhere on his body and he has no difficulty breathing.   Patient tolerated his oral challenge and his penicillin allergy has been removed from his inpatient record. I have called his [place] and PCP, Simona Huh, to inform them that the patient is not penicillin allergic and they can remove the allergy from his records.   Test Date Product Scratch Width (mm) Intradermal #1 Width (mm)  Intradermal #2 Width (mm) Results (Pos/Neg/Ambiguous)   PRE-PEN (undiluted) 0 mm 0 0 0 0 Neg Neg   Penicillin G (10,000 U/mL) 0 mm 0 0 0 0 Neg Neg   Dilutent Control 0 mm 0 0   Neg Neg   Histamine (1mg /mL) 5 mm      - -    Criteria for positive prick skin test: Induration > 30mm greater than diluent control Criteria for positive intradermal skin test: Significant increase in size of original bleb with wheal diameter 68mm or more larger than diluent control; itching and flare are commonly present Criteria for negative intradermal skin test: No increase in size of original bleb and no reaction greater than control site Equivocal intradermal skin test: Wheal only slightly larger than initial injection bleb and control site, with or without erythematous flare OR duplicates are discordant. Control site: If wheal >2-3 mm after 20 min, repeat skin test to look for dermatographism     Interpretation: [x]  NEGATIVE for penicillin allergy []  POSITIVE for penicillin allergy      Nicoletta Dress, PharmD PGY2 Infectious Disease Pharmacy Resident

## 2019-08-01 NOTE — Progress Notes (Signed)
Lower extremity venous has been completed.   Preliminary results in CV Proc.   Abram Sander 08/01/2019 9:58 AM

## 2019-08-01 NOTE — Progress Notes (Signed)
Patients mom thinks he needs to have 100-160 mg of lovenox.  Changed wound vac container because it was 567mL

## 2019-08-01 NOTE — Progress Notes (Signed)
ANTICOAGULATION CONSULT NOTE - Initial Consult  Pharmacy Consult for Heparin drip Indication: DVT  Allergies  Allergen Reactions  . Penicillins Anaphylaxis    Did it involve swelling of the face/tongue/throat, SOB, or low BP? Yes Did it involve sudden or severe rash/hives, skin peeling, or any reaction on the inside of your mouth or nose? No Did you need to seek medical attention at a hospital or doctor's office? Yes When did it last happen?4 months old If all above answers are "NO", may proceed with cephalosporin use.   . Tramadol Anaphylaxis    Patient Measurements: Height: 6\' 6"  (198.1 cm) Weight: (!) 361 lb 8.9 oz (164 kg) IBW/kg (Calculated) : 91.4 Heparin Dosing Weight: 128 kg  Vital Signs: Temp: 98.5 F (36.9 C) (09/30 1605) Temp Source: Oral (09/30 1605) BP: 123/76 (09/30 1648)  Labs: Recent Labs    07/30/19 0550 07/31/19 0745 07/31/19 2158 08/01/19 0547  HGB 7.4* 6.9* 7.4* 7.7*  HCT 23.2* 21.5* 22.8* 23.9*  PLT 299 299  --  302  CREATININE 0.51* 0.55*  --  0.61    Estimated Creatinine Clearance: 188.1 mL/min (by C-G formula based on SCr of 0.61 mg/dL).   Medical History: Past Medical History:  Diagnosis Date  . Arthritis   . Asthma   . Diabetes mellitus, type 2 ([place])   . Hypertension   . Morbid obesity ([place])   . OSA (obstructive sleep apnea)      Assessment: 48 yo male with Right LE DVT. H/O anemia requiring blood transfusion. Patient to start on a heparin drip with goal HL at lower end of goal due to anemia per MD. Patient received 0.5mg /kg of enoxaparin already this AM.   Goal of Therapy:  Heparin level 0.3-0.5 Monitor platelets by anticoagulation protocol: Yes   Plan:  D/C enoxaparin Start heparin drip at 2100 units/hr, no bolus Heparin level in 6 hours Daily Heparin level and CBC Monitor for bleeding  Mary-Anne Polizzi A. Levada Dy, PharmD, BCPS, FNKF Clinical Pharmacist [place] Please utilize [facility] for appropriate phone number to reach  the unit pharmacist ([place])    08/01/2019,5:57 PM

## 2019-08-02 ENCOUNTER — Inpatient Hospital Stay: Payer: Self-pay | Admitting: Infectious Diseases

## 2019-08-02 LAB — CBC
HCT: 24.7 % — ABNORMAL LOW (ref 39.0–52.0)
Hemoglobin: 7.6 g/dL — ABNORMAL LOW (ref 13.0–17.0)
MCH: 30.5 pg (ref 26.0–34.0)
MCHC: 30.8 g/dL (ref 30.0–36.0)
MCV: 99.2 fL (ref 80.0–100.0)
Platelets: 279 10*3/uL (ref 150–400)
RBC: 2.49 MIL/uL — ABNORMAL LOW (ref 4.22–5.81)
RDW: 16.1 % — ABNORMAL HIGH (ref 11.5–15.5)
WBC: 6.1 10*3/uL (ref 4.0–10.5)
nRBC: 1.3 % — ABNORMAL HIGH (ref 0.0–0.2)

## 2019-08-02 LAB — BASIC METABOLIC PANEL
Anion gap: 9 (ref 5–15)
BUN: <5 mg/dL — ABNORMAL LOW (ref 6–20)
CO2: 28 mmol/L (ref 22–32)
Calcium: 8.3 mg/dL — ABNORMAL LOW (ref 8.9–10.3)
Chloride: 98 mmol/L (ref 98–111)
Creatinine, Ser: 0.65 mg/dL (ref 0.61–1.24)
GFR calc Af Amer: >60 mL/min (ref >60)
GFR calc non Af Amer: >60 mL/min (ref >60)
Glucose, Bld: 136 mg/dL — ABNORMAL HIGH (ref 70–99)
Potassium: 3.4 mmol/L — ABNORMAL LOW (ref 3.5–5.1)
Sodium: 135 mmol/L (ref 135–145)

## 2019-08-02 LAB — HEPARIN LEVEL (UNFRACTIONATED)
Heparin Unfractionated: 0.44 IU/mL (ref 0.30–0.70)
Heparin Unfractionated: 0.21 IU/mL — ABNORMAL LOW (ref 0.30–0.70)

## 2019-08-02 LAB — GLUCOSE, CAPILLARY
Glucose-Capillary: 129 mg/dL — ABNORMAL HIGH (ref 70–99)
Glucose-Capillary: 137 mg/dL — ABNORMAL HIGH (ref 70–99)
Glucose-Capillary: 97 mg/dL (ref 70–99)
Glucose-Capillary: 129 mg/dL — ABNORMAL HIGH (ref 70–99)

## 2019-08-02 MED ORDER — POTASSIUM CHLORIDE CRYS ER 20 MEQ PO TBCR
40.0000 meq | EXTENDED_RELEASE_TABLET | Freq: Once | ORAL | Status: AC
  Administered 2019-08-02: 40 meq via ORAL
  Filled 2019-08-02: qty 2

## 2019-08-02 NOTE — Progress Notes (Signed)
Ortho Trauma Note  Doing okay. Still with anemia. Started on heparin drip yesterday for tibial DVT. Significant amount of fluid from wound vac after moving with therapy.  O: NAD, AAOx3 RLE: RLE incisional vac with blockage. Placed another track pad and improved suction. Removed incisional wound vac over hip Moves leg much better today  Results for orders placed or performed during the hospital encounter of 07/05/19 (from the past 24 hour(s))  Glucose, capillary     Status: None   Collection Time: 08/01/19  4:05 PM  Result Value Ref Range   Glucose-Capillary 80 70 - 99 mg/dL  Glucose, capillary     Status: Abnormal   Collection Time: 08/01/19  9:52 PM  Result Value Ref Range   Glucose-Capillary 129 (H) 70 - 99 mg/dL  Heparin level (unfractionated)     Status: None   Collection Time: 08/02/19  1:47 AM  Result Value Ref Range   Heparin Unfractionated 0.44 0.30 - 0.70 IU/mL  CBC     Status: Abnormal   Collection Time: 08/02/19  4:30 AM  Result Value Ref Range   WBC 6.1 4.0 - 10.5 K/uL   RBC 2.49 (L) 4.22 - 5.81 MIL/uL   Hemoglobin 7.6 (L) 13.0 - 17.0 g/dL   HCT 24.7 (L) 39.0 - 52.0 %   MCV 99.2 80.0 - 100.0 fL   MCH 30.5 26.0 - 34.0 pg   MCHC 30.8 30.0 - 36.0 g/dL   RDW 16.1 (H) 11.5 - 15.5 %   Platelets 279 150 - 400 K/uL   nRBC 1.3 (H) 0.0 - 0.2 %  Basic metabolic panel     Status: Abnormal   Collection Time: 08/02/19  4:30 AM  Result Value Ref Range   Sodium 135 135 - 145 mmol/L   Potassium 3.4 (L) 3.5 - 5.1 mmol/L   Chloride 98 98 - 111 mmol/L   CO2 28 22 - 32 mmol/L   Glucose, Bld 136 (H) 70 - 99 mg/dL   BUN <5 (L) 6 - 20 mg/dL   Creatinine, Ser 0.65 0.61 - 1.24 mg/dL   Calcium 8.3 (L) 8.9 - 10.3 mg/dL   GFR calc non Af Amer >60 >60 mL/min   GFR calc Af Amer >60 >60 mL/min   Anion gap 9 5 - 15  Glucose, capillary     Status: Abnormal   Collection Time: 08/02/19  7:47 AM  Result Value Ref Range   Glucose-Capillary 129 (H) 70 - 99 mg/dL  Glucose, capillary      Status: Abnormal   Collection Time: 08/02/19 12:26 PM  Result Value Ref Range   Glucose-Capillary 137 (H) 70 - 99 mg/dL    Plan: Continue incisional vac for another 24-48 hours secondary to increased output. If vac fails may have to remove sooner. If blockage alert would recommend connecting to wall suction. Continue anticoagulation per hospitalist team.  Shona Needles, MD [facility] [phone] (phone) [phone] (office) [email or web address]

## 2019-08-02 NOTE — Progress Notes (Signed)
PROGRESS NOTE        PATIENT DETAILS Name: Bradley Cox Age: 48 y.o. Sex: male Date of Birth: Oct 25, 1971 Admit Date: 07/05/2019 Admitting Physician Norval Morton, MD WE:[id number], Apolonio Schneiders, NP  Brief Narrative: Patient is a 48 y.o. 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  Has undergone serial debridement-evaluated by ID-with recommendations to continue with Ancef-stop date of 10/26.  Subjective: Less pain compared to yesterday today.  Assessment/Plan: Sepsis secondary to disseminated MSSA infection including bacteremia, L3-S1 paraspinal abscess, right psoas abscess right posterior thigh abscess: Sepsis pathophysiology has resolved-clinically improved-patient has undergone serial debridement of the right thigh abscess by orthopedics.  Patient is s/p CT-guided drainage of the paraspinal abscess by IR.  ID recommending to continue Ancef with stop date of 10/26.  Anemia: I suspect this is multifactorial-some amount of blood loss from repeated I&D's-but also probably from anemia related to critical illness.  Hemoglobin is stable-we will continue to follow closely.  Patient is s/p PRBC transfusion on 9/27 and 9/29.  Follow closely as patient now on anticoagulation with heparin.  DVT right lower extremity: Probably provoked-given hospitalization/repeated surgeries.  Given limited ambulation-we will be at risk for further clots and propagation of this DVT.  Difficult situation-given severity of anemia and PRBC transfusion-however after speaking with load orthopedics and with vascular surgery (Dr. Trula Slade on 9/30)-it is felt that benefits of anticoagulation outweigh any obvious bleeding risk from the operative site (spoke with orthopedics-Dr. Doreatha Martin today).   Patient also sustained a syncopal episode yesterday-thankfully CT angiogram of the chest negative for large blood clot.  Has been started on IV heparin-on 9/30-plan is to monitor 1-2 days inpatient to ensure stability with anticoagulation before consideration of discharge.  DKA: Resolved  Insulin-dependent DM-2: CBGs currently stable-continue NPH 28 units twice daily, 8 units of NovoLog with meals and SSI  HTN: BP stable-continue hydralazine.  Asthma: Stable without any evidence of bronchospasm.  Obesity: Estimated body mass index is 41.78 kg/m as calculated from the following:   Height as of this encounter: 6\' 6"  (1.981 m).   Weight as of this encounter: 164 kg.   Diet: Diet Order            Diet Carb Modified Fluid consistency: Thin; Room service appropriate? Yes  Diet effective now               DVT Prophylaxis: IV heparin.  Code Status: Full code   Family Communication: None at bedside-spoke with family member at bedside yesterday.  Disposition Plan: Remain inpatient-Per CIR-not a candidate for inpatient rehab-we will plan on going home with home health services by the end of the week.  Antimicrobial agents: Anti-infectives (From admission, onward)   Start     Dose/Rate Route Frequency Ordered Stop   08/01/19 1600  amoxicillin (AMOXIL) capsule 500 mg     500 mg Oral  Once 08/01/19 1509 08/01/19 1607   08/01/19 1315  penicillin G in sodium chloride 10,000 units/mL syringe for skin test - 1st bleb     1,000 Units Intradermal  Once 08/01/19 1042 08/01/19 1535   08/01/19 1315  penicillin G in sodium chloride 10,000 units/mL syringe for skin test - 2nd bleb  1,000 Units Intradermal  Once 08/01/19 1042 08/01/19 1537   08/01/19 1300  penicillin G in sodium chloride 10,000 units/mL syringe for skin test     1,000 Units Topical  Once 08/01/19 1042 08/01/19 1535   07/17/19 1857  gentamicin (GARAMYCIN) injection  Status:  Discontinued       As needed 07/17/19 1857  07/17/19 2009   07/17/19 1857  vancomycin (VANCOCIN) powder  Status:  Discontinued       As needed 07/17/19 1858 07/17/19 2009   07/10/19 1600  metroNIDAZOLE (FLAGYL) IVPB 500 mg  Status:  Discontinued     500 mg 100 mL/hr over 60 Minutes Intravenous Every 8 hours 07/10/19 1549 07/13/19 0939   07/09/19 0839  vancomycin (VANCOCIN) powder  Status:  Discontinued       As needed 07/09/19 0839 07/09/19 0935   07/09/19 0839  tobramycin (NEBCIN) powder  Status:  Discontinued       As needed 07/09/19 0840 07/09/19 0935   07/07/19 1830  ceFAZolin (ANCEF) IVPB 2g/100 mL premix     2 g 200 mL/hr over 30 Minutes Intravenous Every 8 hours 07/07/19 1333     07/07/19 0900  ceFAZolin (ANCEF) IVPB 2g/100 mL premix  Status:  Discontinued     2 g 200 mL/hr over 30 Minutes Intravenous Every 8 hours 07/07/19 0847 07/07/19 1333   07/06/19 2030  clindamycin (CLEOCIN) IVPB 600 mg  Status:  Discontinued     600 mg 100 mL/hr over 30 Minutes Intravenous Every 8 hours 07/06/19 2010 07/07/19 0847   07/06/19 1800  vancomycin (VANCOCIN) 1,500 mg in sodium chloride 0.9 % 500 mL IVPB  Status:  Discontinued     1,500 mg 250 mL/hr over 120 Minutes Intravenous Every 12 hours 07/06/19 0820 07/07/19 0847   07/06/19 1400  ceFEPIme (MAXIPIME) 2 g in sodium chloride 0.9 % 100 mL IVPB  Status:  Discontinued     2 g 200 mL/hr over 30 Minutes Intravenous Every 8 hours 07/06/19 0504 07/07/19 0847   07/06/19 1400  vancomycin (VANCOCIN) 2,000 mg in sodium chloride 0.9 % 500 mL IVPB  Status:  Discontinued     2,000 mg 250 mL/hr over 120 Minutes Intravenous Every 8 hours 07/06/19 0504 07/06/19 0818   07/06/19 0745  clindamycin (CLEOCIN) IVPB 600 mg  Status:  Discontinued     600 mg 100 mL/hr over 30 Minutes Intravenous  Once 07/06/19 0737 07/06/19 2029   07/06/19 0500  ceFEPIme (MAXIPIME) 2 g in sodium chloride 0.9 % 100 mL IVPB     2 g 200 mL/hr over 30 Minutes Intravenous  Once 07/06/19 0451 07/06/19 1335   07/06/19 0500   vancomycin (VANCOCIN) 2,500 mg in sodium chloride 0.9 % 500 mL IVPB     2,500 mg 250 mL/hr over 120 Minutes Intravenous  Once 07/06/19 0451 07/06/19 1335      Procedures: 9/4>> CT-guided aspiration of right paraspinal abscess at L4-L5 07/09/19>>  incision and drainage of the right hip/pelvic abscess along with neuroplasty of the right sciatic nerve by neurosurgery 9/9>>TEE 07/17/2019>>I&D of right gluteal abscess with drainage of 300 cc purulent fluid.   07/20/19>> Repeat irrigation and debridement right hip and application of wound VAC  07/27/19>> incision and drainage of posterior right thigh abscess -wound VAC placement 9/28>> repeat irrigation and debridement of right thigh abscess, incisional wound VAC placement.   CONSULTS:  ID, orthopedic surgery and Neurosurgery  Time spent: 25- minutes-Greater than 50% of this time was spent in counseling, explanation  of diagnosis, planning of further management, and coordination of care.  MEDICATIONS: Scheduled Meds:  sodium chloride   Intravenous Once   sodium chloride   Intravenous Once   acetaminophen  1,000 mg Oral TID   Chlorhexidine Gluconate Cloth  6 each Topical Daily   gabapentin  300 mg Oral TID   hydrALAZINE  25 mg Oral BID   influenza vac split quadrivalent PF  0.5 mL Intramuscular Tomorrow-1000   insulin aspart  0-15 Units Subcutaneous TID WC   insulin aspart  0-5 Units Subcutaneous QHS   insulin aspart  8 Units Subcutaneous TID WC   insulin NPH Human  28 Units Subcutaneous BID AC & HS   iron polysaccharides  150 mg Oral Daily   methocarbamol  750 mg Oral TID   metoCLOPramide  10 mg Oral TID AC & HS   polyethylene glycol  17 g Oral BID   senna-docusate  1 tablet Oral BID   sodium chloride flush  10-40 mL Intracatheter Q12H   Continuous Infusions:   ceFAZolin (ANCEF) IV 2 g (08/02/19 1042)   heparin 2,100 Units/hr (08/02/19 1044)   PRN Meds:.albuterol, diphenhydrAMINE, hydrALAZINE, HYDROmorphone,  ondansetron **OR** ondansetron (ZOFRAN) IV, oxyCODONE, sodium chloride flush, zolpidem   PHYSICAL EXAM: Vital signs: Vitals:   08/01/19 1648 08/01/19 2046 08/01/19 2345 08/02/19 0745  BP: 123/76 105/66 112/66 119/72  Pulse:  (!) 102 98 99  Resp:    20  Temp:  98.9 F (37.2 C) 98.8 F (37.1 C) 98.5 F (36.9 C)  TempSrc:  Oral Oral Oral  SpO2:   96% 98%  Weight:      Height:       Filed Weights   07/05/19 1807 07/06/19 2116 07/27/19 1113  Weight: (!) 167.4 kg (!) 164 kg (!) 164 kg   Body mass index is 41.78 kg/m.   Gen Exam:Alert awake-not in any distress HEENT:atraumatic, normocephalic Chest: B/L clear to auscultation anteriorly CVS:S1S2 regular Abdomen:soft non tender, non distended Extremities:no edema Neurology: Non focal Skin: no rash  I have personally reviewed following labs and imaging studies  LABORATORY DATA: CBC: Recent Labs  Lab 07/28/19 0553 07/29/19 0644  07/30/19 0550 07/31/19 0745 07/31/19 2158 08/01/19 0547 08/02/19 0430  WBC 8.8 6.4  --  6.5 5.5  --  9.5 6.1  NEUTROABS 6.1 4.0  --  4.4 3.4  --  7.0  --   HGB 7.3* 6.7*   < > 7.4* 6.9* 7.4* 7.7* 7.6*  HCT 22.6* 20.3*   < > 23.2* 21.5* 22.8* 23.9* 24.7*  MCV 93.8 93.5  --  94.3 96.0  --  96.8 99.2  PLT 263 267  --  299 299  --  302 279   < > = values in this interval not displayed.    Basic Metabolic Panel: Recent Labs  Lab 07/29/19 0644 07/30/19 0550 07/31/19 0745 08/01/19 0547 08/02/19 0430  NA 134* 133* 134* 133* 135  K 3.6 3.6 3.8 3.5 3.4*  CL 91* 93* 94* 95* 98  CO2 31 31 29 29 28   GLUCOSE 109* 133* 183* 92 136*  BUN <5* <5* <5* 5* <5*  CREATININE 0.49* 0.51* 0.55* 0.61 0.65  CALCIUM 8.6* 8.5* 8.6* 8.1* 8.3*    GFR: Estimated Creatinine Clearance: 188.1 mL/min (by C-G formula based on SCr of 0.65 mg/dL).  Liver Function Tests: No results for input(s): AST, ALT, ALKPHOS, BILITOT, PROT, ALBUMIN in the last 168 hours. No results for input(s): LIPASE, AMYLASE in the last  168 hours.  No results for input(s): AMMONIA in the last 168 hours.  Coagulation Profile: No results for input(s): INR, PROTIME in the last 168 hours.  Cardiac Enzymes: No results for input(s): CKTOTAL, CKMB, CKMBINDEX, TROPONINI in the last 168 hours.  BNP (last 3 results) No results for input(s): PROBNP in the last 8760 hours.  HbA1C: No results for input(s): HGBA1C in the last 72 hours.  CBG: Recent Labs  Lab 08/01/19 0749 08/01/19 1135 08/01/19 1605 08/01/19 2152 08/02/19 0747  GLUCAP 126* 142* 80 129* 129*    Lipid Profile: No results for input(s): CHOL, HDL, LDLCALC, TRIG, CHOLHDL, LDLDIRECT in the last 72 hours.  Thyroid Function Tests: No results for input(s): TSH, T4TOTAL, FREET4, T3FREE, THYROIDAB in the last 72 hours.  Anemia Panel: No results for input(s): VITAMINB12, FOLATE, FERRITIN, TIBC, IRON, RETICCTPCT in the last 72 hours.  Urine analysis:    Component Value Date/Time   COLORURINE YELLOW 07/06/2019 [place] 07/06/2019 0437   LABSPEC 1.024 07/06/2019 0437   PHURINE 5.0 07/06/2019 0437   GLUCOSEU >=500 (A) 07/06/2019 0437   HGBUR MODERATE (A) 07/06/2019 0437   BILIRUBINUR NEGATIVE 07/06/2019 0437   KETONESUR 80 (A) 07/06/2019 0437   PROTEINUR NEGATIVE 07/06/2019 0437   NITRITE NEGATIVE 07/06/2019 0437   LEUKOCYTESUR NEGATIVE 07/06/2019 0437    Sepsis Labs: Lactic Acid, Venous    Component Value Date/Time   LATICACIDVEN 2.6 ([place]) 07/06/2019 2213    MICROBIOLOGY: Recent Results (from the past 240 hour(s))  Aerobic/Anaerobic Culture (surgical/deep wound)     Status: None   Collection Time: 07/27/19 12:56 PM   Specimen: PATH Other; Tissue  Result Value Ref Range Status   Specimen Description ABSCESS RIGHT THIGH  Final   Special Requests NONE  Final   Gram Stain NO WBC SEEN NO ORGANISMS SEEN   Final   Culture   Final    FEW STAPHYLOCOCCUS AUREUS NO ANAEROBES ISOLATED Performed at [facility], [place] [address] [place], [place] [address]    Report Status 08/01/2019 FINAL  Final   Organism ID, Bacteria STAPHYLOCOCCUS AUREUS  Final      Susceptibility   Staphylococcus aureus - MIC*    CIPROFLOXACIN <=0.5 SENSITIVE Sensitive     ERYTHROMYCIN >=8 RESISTANT Resistant     GENTAMICIN <=0.5 SENSITIVE Sensitive     OXACILLIN <=0.25 SENSITIVE Sensitive     TETRACYCLINE >=16 RESISTANT Resistant     VANCOMYCIN 1 SENSITIVE Sensitive     TRIMETH/SULFA <=10 SENSITIVE Sensitive     CLINDAMYCIN RESISTANT Resistant     RIFAMPIN <=0.5 SENSITIVE Sensitive     Inducible Clindamycin POSITIVE Resistant     * FEW STAPHYLOCOCCUS AUREUS    RADIOLOGY STUDIES/RESULTS: Dg Abd 1 View  Result Date: 07/15/2019 CLINICAL DATA:  Abdominal pain. EXAM: ABDOMEN - 1 VIEW COMPARISON:  07/12/2019 FINDINGS: Nondistended gas-filled loops of small bowel and colon noted. Nondistended gas-filled stomach is present. No suspicious calcifications are present. Stool in the colon is present. IMPRESSION: Nonspecific nonobstructive bowel gas pattern with nondistended gas-filled stomach, small bowel and colon. Electronically Signed   By: Margarette Canada M.D.   On: 07/15/2019 17:02   Dg Abd 1 View  Result Date: 07/12/2019 CLINICAL DATA:  NG tube placement EXAM: ABDOMEN - 1 VIEW COMPARISON:  None. FINDINGS: Nasogastric tube tip and side port project over the stomach. Lung bases are clear. No dilated bowel is visible. IMPRESSION: NG tube tip and side port project over the stomach. Electronically Signed   By:  Ulyses Jarred M.D.   On: 07/12/2019 00:33   Dg Abd 1 View  Result Date: 07/11/2019 CLINICAL DATA:  Nausea and vomiting with abdominal pain EXAM: ABDOMEN - 1 VIEW COMPARISON:  07/06/2019 FINDINGS: Marked gaseous dilatation of the stomach. Otherwise nonobstructed gas pattern with gas in the small and large bowel. Findings could be secondary to enteritis or mild ileus. IMPRESSION: 1. Incompletely visualized marked gaseous dilatation of stomach, consider  NG tube decompression. Outlet obstruction could be considered. 2. Remainder of the gas pattern shows air-filled small and large bowel in a nonobstructed pattern, question enteritis or mild ileus. Electronically Signed   By: Donavan Foil M.D.   On: 07/11/2019 21:58   Ct Angio Chest Pe W Or Wo Contrast  Result Date: 08/01/2019 CLINICAL DATA:  48 year old male with syncope. EXAM: CT ANGIOGRAPHY CHEST WITH CONTRAST TECHNIQUE: Multidetector CT imaging of the chest was performed using the standard protocol during bolus administration of intravenous contrast. Multiplanar CT image reconstructions and MIPs were obtained to evaluate the vascular anatomy. CONTRAST:  179mL OMNIPAQUE IOHEXOL 350 MG/ML SOLN COMPARISON:  None. FINDINGS: Cardiovascular: Borderline cardiomegaly. No pericardial effusion. Multi vessel coronary vascular calcification. The thoracic aorta is unremarkable. Evaluation of the pulmonary arteries is somewhat limited due to suboptimal opacification and visualization of the peripheral branches and respiratory motion artifact. No large or central pulmonary artery embolus identified. Right sided PICC with tip close to the cavoatrial junction noted. Mediastinum/Nodes: There is no hilar or mediastinal adenopathy. The esophagus and the thyroid gland are grossly unremarkable. No mediastinal fluid collection. Lungs/Pleura: Bibasilar linear atelectasis/scarring. No lobar consolidation, pleural effusion pneumothorax. The central airways are patent. Upper Abdomen: Multiple gallstones. The visualized upper abdomen is otherwise unremarkable. Musculoskeletal: No chest wall abnormality. No acute or significant osseous findings. Review of the MIP images confirms the above findings. IMPRESSION: 1. No acute intrathoracic pathology. No CT evidence of central pulmonary artery embolus. 2. Cholelithiasis. Aortic Atherosclerosis (ICD10-I70.0). Electronically Signed   By: Anner Crete M.D.   On: 08/01/2019 20:55   Mr  Lumbar Spine W Wo Contrast  Result Date: 07/06/2019 CLINICAL DATA:  Infection.  Rule out epidural abscess. EXAM: MRI LUMBAR SPINE WITHOUT AND WITH CONTRAST TECHNIQUE: Multiplanar and multiecho pulse sequences of the lumbar spine were obtained without and with intravenous contrast. CONTRAST:  10 mL Gadovist IV COMPARISON:  CT abdomen pelvis in CT lumbar spine 07/06/2019 FINDINGS: Segmentation:  Normal Alignment:  Mild retrolisthesis L1-2 and L4-5 and L5-S1. Vertebrae: Negative for vertebral fracture or mass. No vertebral edema or abnormal enhancement. No evidence of discitis. Conus medullaris and cauda equina: Conus extends to the L1-2 level. Conus and cauda equina appear normal. Paraspinal and other soft tissues: Large enhancing fluid collection in the right paraspinous muscle at the L4 level measures 5.5 x 6 cm compatible with abscess. Adjacent facet joints show mild degeneration but no definite infection. Multilocular large fluid collection in the right iliopsoas muscle compatible with abscess. This measures at least 5 x 6 cm. Large multilocular fluid collection in the right gluteus muscle measures at least 5 cm. Abscess in the iliopsoas and gluteus muscle appear to communicate through the sciatic notch. Disc levels: L1-2: Disc and facet degeneration. Shallow left-sided disc protrusion with mild subarticular stenosis on the left. L2-3: Negative L3-4: Normal disc space. Small epidural abscess posteriorly on the left. Bilateral facet degeneration without evidence of facet joint infection. L4-5: Mild retrolisthesis. Disc degeneration and facet degeneration. Small extraforaminal disc protrusion on the left. Epidural abscess posteriorly on the  left compressing the thecal sac and contributing to moderate spinal stenosis. No evidence of facet joint infection. L5-S1: Disc degeneration with left-sided disc protrusion. Posterior epidural abscess is ill-defined but is contributing to compression of thecal sac. There is  enhancement and fluid along the right S1 nerve root which may be the route of spread to the epidural space. No evidence of facet infection. IMPRESSION: 1. Multiple areas of infection. Large areas of abscess formation in the right paraspinous muscle at the L4 level. Large abscess in the right iliopsoas muscle and also in the right gluteus muscle. 2. 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 This may have entered the spinal canal via the right S1 nerve root. No evidence of discitis or facet joint infection. Electronically Signed   By: Franchot Gallo M.D.   On: 07/06/2019 13:34   Mr Pelvis W Wo Contrast  Result Date: 07/06/2019 CLINICAL DATA:  Right-sided low back pain for several weeks. No recent injury. Recent antibiotic therapy for urinary tract infection. EXAM: MRI PELVIS WITHOUT AND WITH CONTRAST TECHNIQUE: Multiplanar multisequence MR imaging of the pelvis was performed both before and after administration of intravenous contrast. CONTRAST:  10 cc Gadavist COMPARISON:  Abdominopelvic CT 07/06/2019 and 06/18/2019. FINDINGS: Urinary Tract: The visualized distal ureters and bladder appear unremarkable. Bowel: No bowel wall thickening, distention or surrounding inflammation identified within the pelvis. Vascular/Lymphatic: No enlarged pelvic lymph nodes identified. No significant vascular findings. Reproductive: There are areas T2 hyperintensity and decreased enhancement centrally in the prostate gland bilaterally which may be secondary to benign prosthetic hypertrophy or prostatitis. There is no surrounding inflammatory change. The seminal vesicles appear normal. Other: No ascites. Musculoskeletal: As demonstrated on earlier CT scan, there are multiple complex fluid collections surrounding the right sacroiliac joint. These demonstrate internal air bubbles, heterogeneity and irregular peripheral enhancement. Dominant component laterally in the gluteus musculature measures up to 9.8 x 6.1 cm on image  18/13. There is a component in the right issue femoral fossa measuring up to 5.2 cm on image 27/3. This demonstrates inferior extension around the right common hamstring tendon. There are intrapelvic components extending through the greater sciatic foramen, involving the right piriformis muscle and measuring up to 6.3 cm on image 13/12. There are also irregular components within the right iliopsoas muscle measuring up to 3.8 cm on image 9/13. There are right posterior paraspinal components within the erector spinae musculature, measuring up to 6.2 cm on image 1/13. There is mild T2 hyperintensity and enhancement within the right sacroiliac joint, with associated marrow edema and enhancement in the adjacent right sacral ala. T2 hyperintensity and enhancement are also present within the left proximal common hamstring muscle. There are no focal fluid collections in the left pelvis. No significant hip joint effusion or abnormal synovial enhancement. IMPRESSION: 1. As seen on earlier CT, there are multiple complex fluid collections in the right pelvis with internal air and peripheral enhancement consistent with multifocal abscesses. 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 2. Inflammation extends into the erector spinae musculature on the right. Lumbar spine findings are dictated separately. 3. Mild T2 hyperintensity within the proximal left common hamstring muscle. 4. Central heterogeneity within the prostate gland is removed from these other findings and probably due to BPH. Prostatitis could have this appearance. Electronically Signed   By: Richardean Sale M.D.   On: 07/06/2019 13:32  Ct Abdomen Pelvis W Contrast  Addendum Date: 07/06/2019   ADDENDUM REPORT: 07/06/2019 08:20 ADDENDUM: Original report by Dr. Golden Circle. Addendum by Dr. Jeralyn Ruths for contrast extravasation  documentation: Type of contrast:  Isovue 300 Site of extravasation: Right AC Estimated volume of extravasation: 40 ml Area of extravasation scanned with CT? no PATIENT'S SIGNS AND SYMPTOMS Skin blistering/ulceration: no Decrease capillary refill: no Change in skin color: no Decreased motor function or severe tightness: no Decreased pulses distal to site of extravasation: no Altered sensation: no Increasing pain or signs of increased swelling during observation: no TREATMENT An ice pack was applied with instructions given to the patient for limb elevation and warning signs to look for. The patient was returned to the emergency department. Electronically Signed   By: Logan Bores M.D.   On: 07/06/2019 08:20   Result Date: 07/06/2019 CLINICAL DATA:  Right-sided pain for several weeks, initial encounter EXAM: CT ABDOMEN AND PELVIS WITH CONTRAST TECHNIQUE: Multidetector CT imaging of the abdomen and pelvis was performed using the standard protocol following bolus administration of intravenous contrast. CONTRAST:  119mL ISOVUE-300 IOPAMIDOL (ISOVUE-300) INJECTION 61% COMPARISON:  06/18/2019 FINDINGS: Lower chest: Lung bases are well aerated bilaterally. There is a vague nodular density identified on the first image within the right middle lobe. This area was not covered on the prior exam. Hepatobiliary: Liver is diffusely decreased in attenuation consistent with fatty infiltration. The gallbladder is decompressed. Scattered calcifications are noted within consistent with stones. No obstructive changes are noted. Pancreas: Unremarkable. No pancreatic ductal dilatation or surrounding inflammatory changes. Spleen: Normal in size without focal abnormality. Adrenals/Urinary Tract: Adrenal glands are within normal limits. The kidneys are well visualized with renal calculi stable in appearance from the prior exam. The largest of these lies in the lower pole measuring approximately 9 mm. The left kidney demonstrates no renal  calculi. Very mild prominence of the collecting system and ureter on the right are seen similar to that noted on the prior exam without obstructing lesion. The bladder is well distended. Stomach/Bowel: No obstructive or inflammatory changes of the colon are seen. The appendix is not well visualized. No obstructive or inflammatory changes of the small bowel are seen. The stomach is unremarkable. Vascular/Lymphatic: No significant vascular findings are present. No enlarged abdominal or pelvic lymph nodes. Reproductive: Prostate is unremarkable. Other: No abdominal wall hernia or abnormality. No abdominopelvic ascites. Musculoskeletal: There are changes within the right buttock, right iliac fossa and perispinal musculature on the right with fluid collections and air foci identified consistent with localized infection. The fluid collection within the right paraspinal musculature measures 5.6 x 5.5 cm consistent with a focal abscess. Similar extension into the iliac fossa on the right as well as the musculature of the right buttocks is seen. Soft tissue density in the presacral region and along the right pelvic wall are noted consistent with focal localized infection as well. IMPRESSION: Changes consistent with localized abscess involving the right iliac fossa, musculature of the right buttocks and right paraspinal musculature with extension into the lateral aspect of the pelvis on the right. Percutaneous drainage of the paraspinal collection may be helpful. These changes are all new from the prior exam. Stable renal calculi on the right. Stable cholelithiasis and fatty infiltration of the liver. Electronically Signed: By: Inez Catalina M.D. On: 07/06/2019 07:55   Ct Hip Right Wo Contrast  Result Date: 07/30/2019 CLINICAL DATA:  Right hip abscess. EXAM: CT OF THE RIGHT HIP WITHOUT CONTRAST TECHNIQUE: Multidetector CT imaging of  the right hip was performed according to the standard protocol. Multiplanar CT image  reconstructions were also generated. COMPARISON:  None. FINDINGS: Bones/Joint/Cartilage Widening of the right sacroiliac joint with erosive changes most concerning for septic arthritis. Nondisplaced fracture of the inferior aspect of the right sacrum adjacent to the sacroiliac joint. Subchondral fracture involving the right ilium along the right SI joint. Small amount air along the anterior margin of the right sacroiliac joint within the iliopsoas muscle. No acute fracture or dislocation.  Normal alignment. Ligaments Ligaments are suboptimally evaluated by CT. Soft tissue, Muscles and Tendons Soft tissue wound overlying the right greater trochanter with air within the subcutaneous fat. There has been significant interval improvement in the complex fluid collection involving the right gluteal musculature extending into the pelvis. Small amount air in the left semimembranosus muscle partially visualized. No fluid collection or hematoma.  No soft tissue mass. IMPRESSION: 1. 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 Interval development of a subchondral fracture involving the right ilium and a nondisplaced fracture along the inferior aspect of the right sacrum. 2. Soft tissue wound overlying the right greater trochanter with air within the subcutaneous fat likely postsurgical. Significant interval improvement in the complex fluid collection involving the right gluteal musculature extending into the pelvis. 3. Small amount air in the left semimembranosus muscle partially visualized most concerning for persistent infection in the absence recent instrumentation involving this area. Electronically Signed   By: Kathreen Devoid   On: 07/30/2019 16:56   Mr Tibia Fibula Right W Wo Contrast  Result Date: 07/26/2019 CLINICAL DATA:  drainage posteromedial R knee, eval for abscess. Contacted by nurse regarding new drainage from posteromedial proximal lower R leg. Started this am Pt notes pain only when touching  areaNo other concerns elsewhere Currently on ancef for MSSA from R gluteal/hip abscess EXAM: MRI OF LOWER RIGHT EXTREMITY WITHOUT AND WITH CONTRAST TECHNIQUE: Multiplanar, multisequence MR imaging of the right lower extremity was performed both before and after administration of intravenous contrast. CONTRAST:  48mL GADAVIST GADOBUTROL 1 MMOL/ML IV SOLN COMPARISON:  None. FINDINGS: Soft tissues: Large complex fluid collection extends from the thigh across the posterior knee. It begins above the included field of view and thus is entire extent is not imaged on this exam. The collection terminates in the posterosuperior calf, below the knee. There is peripheral wall enhancement and septal enhancement throughout the collection. Small foci of signal void are noted within the fluid collection consistent with bubbles of air. Transversely, the collection measures a maximum of 11 x 9 cm. The collection is at least 27 cm from superior to inferior, but extends above the included field of view and therefore is longer from superior to inferior. The collection is centered on the hamstrings. The neurovascular structures are displaced anteriorly. There is diffuse surrounding soft tissue edema/cellulitis. Osseous structures: Normal signal. No evidence of edema or osteomyelitis. Right knee joint is normally spaced and aligned. No joint effusion. Visualized tendons are intact, normal in signal. IMPRESSION: 1. Large complex collection consistent with an abscess extending throughout the entire visible posterior thigh, centered on the posterior compartment involving the hamstring musculature. The collection measures 11 x 9 cm transversely. It extends through the visualized thigh across the knee to the upper leg. There is surrounding soft tissue edema/cellulitis. 2. No evidence of osteomyelitis. Knee joint is unremarkable with no effusion to suggest septic arthritis. Electronically Signed   By: Lajean Manes M.D.   On: 07/26/2019 21:17     Ct  Aspiration  Result Date: 07/06/2019 INDICATION: 48 year old with multiple sites of infection including a lumbar paraspinal muscular abscess. Plan for CT-guided aspiration of the paraspinal abscess collection. EXAM: CT-GUIDED ASPIRATION OF PARASPINAL ABSCESS MEDICATIONS: Moderate sedation ANESTHESIA/SEDATION: Fentanyl 100 mcg IV; Versed 2.0 mg IV Moderate Sedation Time:  10 minutes The patient was continuously monitored during the procedure by the interventional radiology nurse under my direct supervision. COMPLICATIONS: None immediate. PROCEDURE: Informed written consent was obtained from the patient after a thorough discussion of the procedural risks, benefits and alternatives. All questions were addressed. A timeout was performed prior to the initiation of the procedure. Patient was placed prone on the CT scanner. CT images through the lower abdomen were obtained. The right paraspinal muscular abscess was identified at the L4-L5 level. Overlying skin was prepped with chlorhexidine. Sterile field was created. Skin was anesthetized with 1% lidocaine. Yueh catheter was directed into the abscess collection with CT guidance. Approximately 15 mL of brown purulent fluid was aspirated. No additional fluid could be aspirated. At this point in the procedure the patient was in severe pain from lying prone and the procedure was stopped. Bandage placed over the puncture site. FINDINGS: Low-density abscess in the right paraspinal musculature at L4-L5. 15 mL of brown purulent fluid was removed from this collection. IMPRESSION: CT-guided aspiration of the right paraspinal abscess at L4-L5. 15 mL of purulent fluid was removed and sent for culture. Electronically Signed   By: Markus Daft M.D.   On: 07/06/2019 19:22   Ct L-spine No Charge  Result Date: 07/06/2019 CLINICAL DATA:  Right-sided back pain and right lower extremity pain for 3 weeks. EXAM: CT LUMBAR SPINE WITHOUT CONTRAST TECHNIQUE: Multidetector CT imaging of  the lumbar spine was performed without intravenous contrast administration. Multiplanar CT image reconstructions were also generated. COMPARISON:  None. FINDINGS: Segmentation: 5 lumbar type vertebrae. Alignment: Normal. Vertebrae: No acute fracture or focal pathologic process. Paraspinal and other soft tissues: There is gas in the paraspinal soft tissues with a 5.6 cm fluid collection in the right multifidus muscle at the L4-5 level. There is gas in the right iliacus and psoas muscles and right gluteus minimus and medius muscles with expansion of those muscles with low-density consistent with abscess. There is also fluid and abnormal soft tissue in the presacral space extending through the right sciatic notch. There is a tiny bubble of air in the right hip joint. Cholelithiasis. 5 mm stone in the mid right kidney. Small right inguinal hernia containing only fat. Disc levels: T7-8 through L3-4: No appreciable disc protrusions. The discs at T9-10 and T11-12 are degenerated with vacuum phenomena. L4-5: Small broad-based disc bulge. Slight hypertrophy of the left ligamentum flavum creates narrowing of the spinal canal. The CT thecal sac appears compressed in the transverse dimension. L5-S1: Disc space narrowing asymmetric disc osteophyte complex to the left of midline. There appears to be abnormal soft tissue in the spinal canal at S1. Given the adjacent soft tissue infection, the possibility of infection in the spinal canal should be considered. MRI of the lower abdomen and pelvis and lumbar spine recommended for further evaluation of the findings to determine whether there is epidural extension of infection. IMPRESSION: 1. Extensive soft tissue infection in bulging the right paraspinal muscles, right gluteal muscles, presacral space, and the soft tissues of the right sciatic notch. There is a definable abscess in the right multifidus muscle. 2. Subtle increased soft tissue in the spinal canal at S1. Asymmetric  compression of the thecal sac  at L4-5. The possibility of infection within the spinal canal should be considered. 3. MRI of the lumbar spine with and without contrast and MRI of the lower abdomen and pelvis with and without contrast recommended for further evaluation. Critical Value/emergent results were called by telephone at the time of interpretation on 07/06/2019 at 8:17 am to the attending ER physician , who verbally acknowledged these results. Electronically Signed   By: Lorriane Shire M.D.   On: 07/06/2019 08:16   Dg Chest Portable 1 View  Result Date: 07/06/2019 CLINICAL DATA:  Shortness of breath. EXAM: PORTABLE CHEST 1 VIEW COMPARISON:  None. FINDINGS: Mild cardiomegaly. Normal mediastinal contours. No focal airspace disease, pleural effusion, or pneumothorax. No pulmonary edema. No acute osseous abnormalities. IMPRESSION: Mild cardiomegaly. No acute chest findings. Electronically Signed   By: Keith Rake M.D.   On: 07/06/2019 04:10   Vas [place] Lower Extremity Venous (dvt)  Result Date: 08/01/2019  Lower Venous Study Indications: Follow up dvt.  Limitations: Bandages and open wound. Comparison Study: previous study done 9/25 Performing Technologist: Abram Sander RVS  Examination Guidelines: A complete evaluation includes B-mode imaging, spectral Doppler, color Doppler, and power Doppler as needed of all accessible portions of each vessel. Bilateral testing is considered an integral part of a complete examination. Limited examinations for reoccurring indications may be performed as noted.  +---------+---------------+---------+-----------+----------+-----------------+  RIGHT     Compressibility Phasicity Spontaneity Properties Thrombus Aging     +---------+---------------+---------+-----------+----------+-----------------+  CFV       Full            Yes       Yes                                       +---------+---------------+---------+-----------+----------+-----------------+  SFJ       Full                                                                 +---------+---------------+---------+-----------+----------+-----------------+  FV Prox   Full                                                                +---------+---------------+---------+-----------+----------+-----------------+  FV Mid    Full                                                                +---------+---------------+---------+-----------+----------+-----------------+  FV Distal Full                                                                +---------+---------------+---------+-----------+----------+-----------------+  POP                                                        Not visualized     +---------+---------------+---------+-----------+----------+-----------------+  PTV       None                                             Age Indeterminate  +---------+---------------+---------+-----------+----------+-----------------+  PERO                                                       Not visualized     +---------+---------------+---------+-----------+----------+-----------------+   +----+---------------+---------+-----------+----------+--------------+  LEFT Compressibility Phasicity Spontaneity Properties Thrombus Aging  +----+---------------+---------+-----------+----------+--------------+  CFV  Full            Yes       Yes                                    +----+---------------+---------+-----------+----------+--------------+     Summary: Right: Findings consistent with age indeterminate deep vein thrombosis involving the right posterior tibial veins. No cystic structure found in the popliteal fossa. Left: No evidence of common femoral vein obstruction.  *See table(s) above for measurements and observations. Electronically signed by Harold Barban MD on 08/01/2019 at 4:08:15 PM.    Final    Vas [place] Lower Extremity Venous (dvt)  Result Date: 07/28/2019  Lower Venous Study Indications: Edema, abcess right proximal calf, and  Pain.  Comparison Study: 07/11/19 RLE negative Performing Technologist: June Leap RDMS, RVT  Examination Guidelines: A complete evaluation includes B-mode imaging, spectral Doppler, color Doppler, and power Doppler as needed of all accessible portions of each vessel. Bilateral testing is considered an integral part of a complete examination. Limited examinations for reoccurring indications may be performed as noted.  +---------+---------------+---------+-----------+----------+--------------+  RIGHT     Compressibility Phasicity Spontaneity Properties Thrombus Aging  +---------+---------------+---------+-----------+----------+--------------+  CFV       Full            Yes       Yes                                    +---------+---------------+---------+-----------+----------+--------------+  SFJ       Full                                                             +---------+---------------+---------+-----------+----------+--------------+  FV Prox   Full                                                             +---------+---------------+---------+-----------+----------+--------------+  FV Mid    Full                                                             +---------+---------------+---------+-----------+----------+--------------+  FV Distal Full                                                             +---------+---------------+---------+-----------+----------+--------------+  PFV       Full                                                             +---------+---------------+---------+-----------+----------+--------------+  POP                                                        Not visualized  +---------+---------------+---------+-----------+----------+--------------+  PTV       None                                             Acute           +---------+---------------+---------+-----------+----------+--------------+  PERO      Full                                                              +---------+---------------+---------+-----------+----------+--------------+   +----+---------------+---------+-----------+----------+--------------+  LEFT Compressibility Phasicity Spontaneity Properties                 +----+---------------+---------+-----------+----------+--------------+  CFV                                                   Not visualized  +----+---------------+---------+-----------+----------+--------------+     Summary: Right: Findings consistent with acute deep vein thrombosis involving the right posterior tibial veins.  *See table(s) above for measurements and observations. Electronically signed by Curt Jews MD on 07/28/2019 at 4:04:40 AM.    Final    Vas [place] Lower Extremity Venous (dvt)  Result Date: 07/09/2019  Lower Venous Study Indications: Pain.  Risk Factors: Abscess of spinal canal, right gluteal muscle and iliopsoas muscle. Comparison Study: No prior study on file Performing Technologist: Sharion Dove RVS  Examination Guidelines: A complete evaluation includes B-mode imaging, spectral Doppler, color Doppler, and power Doppler as needed of all accessible portions of each vessel. Bilateral testing is considered an integral part of a  complete examination. Limited examinations for reoccurring indications may be performed as noted.  +---------+---------------+---------+-----------+----------+--------------+  RIGHT     Compressibility Phasicity Spontaneity Properties Thrombus Aging  +---------+---------------+---------+-----------+----------+--------------+  CFV       Full            Yes       Yes                                    +---------+---------------+---------+-----------+----------+--------------+  SFJ       Full                                                             +---------+---------------+---------+-----------+----------+--------------+  FV Prox   Full                                                              +---------+---------------+---------+-----------+----------+--------------+  FV Mid    Full                                                             +---------+---------------+---------+-----------+----------+--------------+  FV Distal Full                                                             +---------+---------------+---------+-----------+----------+--------------+  PFV       Full                                                             +---------+---------------+---------+-----------+----------+--------------+  POP       Full            Yes       Yes                                    +---------+---------------+---------+-----------+----------+--------------+  PTV       Full                                                             +---------+---------------+---------+-----------+----------+--------------+  PERO      Full                                                             +---------+---------------+---------+-----------+----------+--------------+   +----+---------------+---------+-----------+----------+--------------+  LEFT Compressibility Phasicity Spontaneity Properties Thrombus Aging  +----+---------------+---------+-----------+----------+--------------+  CFV  Full            Yes       Yes                                    +----+---------------+---------+-----------+----------+--------------+     Summary: Right: There is no evidence of deep vein thrombosis in the lower extremity. Left: No evidence of common femoral vein obstruction.  *See table(s) above for measurements and observations. Electronically signed by Harold Barban MD on 07/09/2019 at 1:13:14 AM.    Final    [place] Ekg Site Rite  Result Date: 07/14/2019 If Site Rite image not attached, placement could not be confirmed due to current cardiac rhythm.    LOS: 27 days   Oren Binet, MD  [facility]  If 7PM-7AM, please contact night-coverage  Please page via [email or web address]  Go to [email or web address] and use [facility]'s universal password to access. If you do not have the password, please contact the hospital operator.  Locate the [facility] provider you are looking for under [facility] and page to a number that you can be directly reached. If you still have difficulty reaching the provider, please page the [facility] (Director on Call) for the Hospitalists listed on amion for assistance.  08/02/2019, 11:57 AM

## 2019-08-02 NOTE — Progress Notes (Signed)
[place] for Heparin drip Indication: DVT  Allergies  Allergen Reactions  . Tramadol Anaphylaxis    Patient Measurements: Height: 6\' 6"  (198.1 cm) Weight: (!) 361 lb 8.9 oz (164 kg) IBW/kg (Calculated) : 91.4 Heparin Dosing Weight: 128 kg  Vital Signs: Temp: 98.5 F (36.9 C) (10/01 1220) Temp Source: Oral (10/01 1220) BP: 111/81 (10/01 1220) Pulse Rate: 99 (10/01 0745)  Labs: Recent Labs    07/31/19 0745 07/31/19 2158 08/01/19 0547 08/02/19 0147 08/02/19 0430 08/02/19 1435  HGB 6.9* 7.4* 7.7*  --  7.6*  --   HCT 21.5* 22.8* 23.9*  --  24.7*  --   PLT 299  --  302  --  279  --   HEPARINUNFRC  --   --   --  0.44  --  0.21*  CREATININE 0.55*  --  0.61  --  0.65  --     Estimated Creatinine Clearance: 188.1 mL/min (by C-G formula based on SCr of 0.65 mg/dL).  Assessment: 48 yo male with Right LE DVT. H/O anemia requiring blood transfusion. Patient to start on a heparin drip with goal HL at lower end of goal due to anemia per MD.  Hep lvl slightly low 0.21  Goal of Therapy:  Heparin level 0.3-0.5 Monitor platelets by anticoagulation protocol: Yes   Plan:  Increase heparin 2300 units/hr 2300 HL  Levester Fresh, PharmD, BCPS, BCCCP Clinical Pharmacist [phone]  Please check AMION for all [place] numbers  08/02/2019 3:32 PM

## 2019-08-02 NOTE — Progress Notes (Addendum)
Physical Therapy Treatment Patient Details Name: Bradley Cox MRN: [id number] DOB: 09-Feb-1971 Today's Date: 08/02/2019    History of Present Illness Patient is a 48 y/o male who presents with right flank pain. Admitted with disseminated MSSA infection including bacteremia, Lumbosacral spinal epidural abscess, paraspinal/large gluteal/pelvic abscess with sciatica impingement. s/p I&D large gluteal abscess and neuroplasty of sciatica nerve 9/7. s/p CT aspiration right paraspinal abscess. PMH includes DM, HTN, morbid obesity, OSA.    PT Comments    Pt progressing well towards physical therapy goals today with improved activity tolerance, pain control and no syncopal episodes. Ambulating 5 feet with min guard assist and walker, demonstrating antalgic gait pattern. BP 111/81 post mobility. Pt very motivated to participate with therapy. Will continue to progress as tolerated.     Follow Up Recommendations  CIR;Supervision/Assistance - 24 hour     Equipment Recommendations  Rolling walker with 5" wheels;Wheelchair (measurements PT);3in1 (PT)    Recommendations for Other Services       Precautions / Restrictions Precautions Precautions: Fall Precaution Comments: wound vac to R posterior thigh/behind knee Restrictions Weight Bearing Restrictions: Yes RLE Weight Bearing: Weight bearing as tolerated    Mobility  Bed Mobility Overal bed mobility: Needs Assistance Bed Mobility: Supine to Sit     Supine to sit: HOB elevated;Supervision     General bed mobility comments: Supervision for safety, no physical assist required to progress to sitting  Transfers Overall transfer level: Needs assistance Equipment used: Rolling walker (2 wheeled) Transfers: Sit to/from Stand Sit to Stand: From elevated surface;+2 safety/equipment;Min assist         General transfer comment: minA from elevated height. cues for rocking to gain momentum  Ambulation/Gait Ambulation/Gait assistance: Min  guard;+2 safety/equipment Gait Distance (Feet): 5 Feet Assistive device: Rolling walker (2 wheeled) Gait Pattern/deviations: Decreased weight shift to right;Antalgic;Step-through pattern;Decreased dorsiflexion - right Gait velocity: decreased   General Gait Details: Cues for direction and sequencing. Noted increased right foot external rotation, antalgic gait pattern   Stairs             Wheelchair Mobility    Modified Rankin (Stroke Patients Only)       Balance Overall balance assessment: Needs assistance Sitting-balance support: Bilateral upper extremity supported;Feet supported Sitting balance-Leahy Scale: Fair     Standing balance support: Bilateral upper extremity supported Standing balance-Leahy Scale: Poor                              Cognition Arousal/Alertness: Awake/alert Behavior During Therapy: WFL for tasks assessed/performed Overall Cognitive Status: Within Functional Limits for tasks assessed                                        Exercises General Exercises - Lower Extremity Short Arc Quad: Right;Supine;5 reps    General Comments        Pertinent Vitals/Pain Pain Assessment: Faces Faces Pain Scale: Hurts even more Pain Location: R LE with movement Pain Descriptors / Indicators: Grimacing;Discomfort;Operative site guarding Pain Intervention(s): Monitored during session    Home Living                      Prior Function            PT Goals (current goals can now be found in the care plan section) Acute Rehab PT Goals  Patient Stated Goal: to walk down the hall Potential to Achieve Goals: Good Progress towards PT goals: Progressing toward goals    Frequency    Min 3X/week      PT Plan Current plan remains appropriate    Co-evaluation              AM-PAC PT "6 Clicks" Mobility   Outcome Measure  Help needed turning from your back to your side while in a flat bed without using  bedrails?: None Help needed moving from lying on your back to sitting on the side of a flat bed without using bedrails?: None Help needed moving to and from a bed to a chair (including a wheelchair)?: A Little Help needed standing up from a chair using your arms (e.g., wheelchair or bedside chair)?: A Lot Help needed to walk in hospital room?: A Little Help needed climbing 3-5 steps with a railing? : Total 6 Click Score: 17    End of Session   Activity Tolerance: Patient tolerated treatment well Patient left: with call bell/phone within reach;in chair;with chair alarm set Nurse Communication: Mobility status PT Visit Diagnosis: Other abnormalities of gait and mobility (R26.89);Muscle weakness (generalized) (M62.81);Pain;Difficulty in walking, not elsewhere classified (R26.2) Pain - Right/Left: Right Pain - part of body: Hip     Time: 1200-1223 PT Time Calculation (min) (ACUTE ONLY): 23 min  Charges:  $Gait Training: 8-22 mins $Therapeutic Activity: 8-22 mins                     Ellamae Sia, PT, DPT Acute Rehabilitation Services Pager [phone] Office [phone]    Willy Eddy 08/02/2019, 1:24 PM

## 2019-08-02 NOTE — Progress Notes (Signed)
[place] for Heparin drip Indication: DVT  Allergies  Allergen Reactions  . Penicillins Anaphylaxis    Did it involve swelling of the face/tongue/throat, SOB, or low BP? Yes Did it involve sudden or severe rash/hives, skin peeling, or any reaction on the inside of your mouth or nose? No Did you need to seek medical attention at a hospital or doctor's office? Yes When did it last happen?4 months old If all above answers are "NO", may proceed with cephalosporin use.   . Tramadol Anaphylaxis    Patient Measurements: Height: 6\' 6"  (198.1 cm) Weight: (!) 361 lb 8.9 oz (164 kg) IBW/kg (Calculated) : 91.4 Heparin Dosing Weight: 128 kg  Vital Signs: Temp: 98.8 F (37.1 C) (09/30 2345) Temp Source: Oral (09/30 2345) BP: 112/66 (09/30 2345) Pulse Rate: 98 (09/30 2345)  Labs: Recent Labs    07/30/19 0550 07/31/19 0745 07/31/19 2158 08/01/19 0547 08/02/19 0147  HGB 7.4* 6.9* 7.4* 7.7*  --   HCT 23.2* 21.5* 22.8* 23.9*  --   PLT 299 299  --  302  --   HEPARINUNFRC  --   --   --   --  0.44  CREATININE 0.51* 0.55*  --  0.61  --     Estimated Creatinine Clearance: 188.1 mL/min (by C-G formula based on SCr of 0.61 mg/dL).   Assessment: 48 yo male with Right LE DVT. H/O anemia requiring blood transfusion. Patient to start on a heparin drip with goal HL at lower end of goal due to anemia per MD. Patient received 0.5mg /kg of enoxaparin already this AM.  Heparin level this am in goal range  Goal of Therapy:  Heparin level 0.3-0.5 Monitor platelets by anticoagulation protocol: Yes   Plan:  Continue heparin drip at 2100 units/hr Daily Heparin level and CBC Monitor for bleeding  Thanks for allowing pharmacy to be a part of this patient's care.  Excell Seltzer, PharmD Clinical Pharmacist   08/02/2019,2:25 AM

## 2019-08-02 NOTE — Consult Note (Signed)
Inpatient Rehabilitation Admissions Coordinator  Initial rehab consult 9/10. We signed off on 9/16 for pain limiting and patient unable to tolerate the intensity of an inpt rehab admit. I was re consulted on 9/29 and awaited therapy treatment s/p drainage of abscesses to reassess need for an inpt rehab admit. Patient preferred d/c home with [facility] and 24/7 support of family when I met with him. Noted patient up on 9/30 with therapy and had syncopal episode. Pt at this time unable to tolerate the intensity of therapy 3 hrs per day.   Danne Baxter, RN, MSN Rehab Admissions Coordinator [phone] 08/02/2019 8:36 AM

## 2019-08-03 LAB — GLUCOSE, CAPILLARY
Glucose-Capillary: 109 mg/dL — ABNORMAL HIGH (ref 70–99)
Glucose-Capillary: 110 mg/dL — ABNORMAL HIGH (ref 70–99)
Glucose-Capillary: 101 mg/dL — ABNORMAL HIGH (ref 70–99)
Glucose-Capillary: 140 mg/dL — ABNORMAL HIGH (ref 70–99)

## 2019-08-03 LAB — CBC
HCT: 25.8 % — ABNORMAL LOW (ref 39.0–52.0)
Hemoglobin: 8 g/dL — ABNORMAL LOW (ref 13.0–17.0)
MCH: 29.7 pg (ref 26.0–34.0)
MCHC: 31 g/dL (ref 30.0–36.0)
MCV: 95.9 fL (ref 80.0–100.0)
Platelets: 294 10*3/uL (ref 150–400)
RBC: 2.69 MIL/uL — ABNORMAL LOW (ref 4.22–5.81)
RDW: 16 % — ABNORMAL HIGH (ref 11.5–15.5)
WBC: 6 10*3/uL (ref 4.0–10.5)
nRBC: 0 % (ref 0.0–0.2)

## 2019-08-03 LAB — HEPARIN LEVEL (UNFRACTIONATED)
Heparin Unfractionated: 0.17 IU/mL — ABNORMAL LOW (ref 0.30–0.70)
Heparin Unfractionated: 0.29 IU/mL — ABNORMAL LOW (ref 0.30–0.70)
Heparin Unfractionated: 0.34 IU/mL (ref 0.30–0.70)

## 2019-08-03 NOTE — Progress Notes (Signed)
[place] for Heparin drip Indication: DVT  Allergies  Allergen Reactions  . Tramadol Anaphylaxis    Patient Measurements: Height: 6\' 6"  (198.1 cm) Weight: (!) 361 lb 8.9 oz (164 kg) IBW/kg (Calculated) : 91.4 Heparin Dosing Weight: 128 kg  Vital Signs: Temp: 98.9 F (37.2 C) (10/02 0735) Temp Source: Oral (10/02 0735) BP: 124/76 (10/02 0735) Pulse Rate: 96 (10/02 0735)  Labs: Recent Labs    08/01/19 0547  08/02/19 0430 08/02/19 1435 08/02/19 2337 08/03/19 0711  HGB 7.7*  --  7.6*  --   --  8.0*  HCT 23.9*  --  24.7*  --   --  25.8*  PLT 302  --  279  --   --  294  HEPARINUNFRC  --    < >  --  0.21* 0.17* 0.34  CREATININE 0.61  --  0.65  --   --   --    < > = values in this interval not displayed.    Estimated Creatinine Clearance: 188.1 mL/min (by C-G formula based on SCr of 0.65 mg/dL).  Assessment: 48 yo male with Right LE DVT. H/O anemia requiring blood transfusion. Patient to start on a heparin drip with goal HL at lower end of goal due to anemia per MD.  Heparin level is therapeutic at 0.34 after increasing drip rate to 2500 units/hr. Hgb remains low-stable. Platelets are within normal limits and stable. No bleeding seen per RN.    Goal of Therapy:  Heparin level 0.3-0.5 Monitor platelets by anticoagulation protocol: Yes   Plan:  Continue heparin at 2500 units/hr Recheck heparin level this PM to confirm.  Monitor daily heparin level, CBC, and for s/sx of bleeding  Sloan Leiter, PharmD, BCPS, BCCCP Clinical Pharmacist  Please check AMION for all [place] phone numbers After 10:00 PM, call [place] [phone] 08/03/2019 8:09 AM

## 2019-08-03 NOTE — Progress Notes (Signed)
[place] for Heparin drip Indication: DVT  Allergies  Allergen Reactions  . Tramadol Anaphylaxis    Patient Measurements: Height: 6\' 6"  (198.1 cm) Weight: (!) 361 lb 8.9 oz (164 kg) IBW/kg (Calculated) : 91.4 Heparin Dosing Weight: 128 kg  Vital Signs: Temp: 100.1 F (37.8 C) (10/02 1900) Temp Source: Oral (10/02 1900) BP: 104/62 (10/02 1900) Pulse Rate: 105 (10/02 1900)  Labs: Recent Labs    08/01/19 0547  08/02/19 0430  08/02/19 2337 08/03/19 0711 08/03/19 1924  HGB 7.7*  --  7.6*  --   --  8.0*  --   HCT 23.9*  --  24.7*  --   --  25.8*  --   PLT 302  --  279  --   --  294  --   HEPARINUNFRC  --    < >  --    < > 0.17* 0.34 0.29*  CREATININE 0.61  --  0.65  --   --   --   --    < > = values in this interval not displayed.    Estimated Creatinine Clearance: 188.1 mL/min (by C-G formula based on SCr of 0.65 mg/dL).  Assessment: 48 yo male with Right LE DVT. H/O anemia requiring blood transfusion. Patient to start on a heparin drip with goal HL at lower end of goal due to anemia per MD.  Heparin level is slightly subtherapeutic at 0.29. Hgb remains low-stable. Platelets are within normal limits and stable. No bleeding noted per RN.    Goal of Therapy:  Heparin level 0.3-0.5 Monitor platelets by anticoagulation protocol: Yes   Plan:  Increase heparin to 2600 units/hr Monitor daily heparin level, CBC, and for s/sx of bleeding   Alanda Slim, PharmD, [facility] Clinical Pharmacist Please see AMION for all Pharmacists' Contact Phone Numbers 08/03/2019, 8:15 PM

## 2019-08-03 NOTE — Progress Notes (Signed)
PROGRESS NOTE        PATIENT DETAILS Name: Bradley Cox Age: 48 y.o. Sex: male Date of Birth: [birth year] Admit Date: 07/05/2019 Admitting Physician Norval Morton, MD WE:[id number], Apolonio Schneiders, NP  Brief Narrative: Patient is a 48 y.o. 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  Has undergone serial debridement-evaluated by ID-with recommendations to continue with Ancef-stop date of 10/26.  Subjective: Continues to have some pain at the operative site in his right lower extremity.  Still with significant output from the VAC.  Assessment/Plan: Sepsis secondary to disseminated MSSA infection including bacteremia, L3-S1 paraspinal abscess, right psoas abscess right posterior thigh abscess: Sepsis pathophysiology has resolved-clinically improved-patient has undergone serial debridement of the right thigh abscess by orthopedics.  Patient is s/p CT-guided drainage of the paraspinal abscess by IR.  ID recommending to continue Ancef with stop date of 10/26.  Orthopedics following-with recommendations to closely monitor output from the VAC-as output has markedly increased over the past few days.  Anemia: I suspect this is multifactorial-some amount of blood loss from repeated I&D's-but also probably from anemia related to critical illness.  Globin is stable-in fact has slowly improved today.  Patient is s/p PRBC transfusion on 9/27 and 9/29.  Continue to follow hemoglobin closely while patient is on anticoagulation.    DVT right lower extremity: Probably provoked-given hospitalization/repeated surgeries.  Given limited ambulation-we will be at risk for further clots and propagation of this DVT.  Difficult situation-has severe anemia requiring 2 units of PRBC  transfusion-however after speaking with vascular surgery (Dr. Trula Slade on 9/30) and orthopedics (Dr. Doreatha Martin) we have elected to start anticoagulation with IV heparin.  Seems to be tolerating anticoagulation well-hemoglobin is stable.  Suspect we could transition to a novel anticoagulant in the next 24 hours.    DKA: Resolved  Insulin-dependent DM-2: CBGs currently stable-continue NPH 28 units twice daily, 8 units of NovoLog with meals and SSI  HTN: BP stable-continue hydralazine.  Asthma: Stable without any evidence of bronchospasm.  Obesity: Estimated body mass index is 41.78 kg/m as calculated from the following:   Height as of this encounter: 6\' 6"  (1.981 m).   Weight as of this encounter: 164 kg.   Diet: Diet Order            Diet Carb Modified Fluid consistency: Thin; Room service appropriate? Yes  Diet effective now               DVT Prophylaxis: IV heparin.  Code Status: Full code   Family Communication: None at bedside  Disposition Plan: Remain inpatient-Per CIR-not a candidate for inpatient rehab-we will plan on going home with home health services -hopefully in the next few days once output from [facility] is more stable.  Antimicrobial agents: Anti-infectives (From admission, onward)   Start     Dose/Rate Route Frequency Ordered Stop   08/01/19 1600  amoxicillin (AMOXIL) capsule 500 mg     500 mg Oral  Once 08/01/19 1509 08/01/19 1607   08/01/19 1315  penicillin G in sodium chloride 10,000 units/mL syringe for skin test - 1st bleb     1,000 Units Intradermal  Once 08/01/19 1042 08/01/19 1535   08/01/19 1315  penicillin G in sodium chloride 10,000 units/mL syringe for skin test - 2nd bleb     1,000 Units Intradermal  Once 08/01/19 1042 08/01/19 1537   08/01/19 1300  penicillin G in sodium chloride 10,000 units/mL syringe for skin test     1,000 Units Topical  Once 08/01/19 1042 08/01/19 1535   07/17/19 1857  gentamicin (GARAMYCIN) injection  Status:  Discontinued         As needed 07/17/19 1857 07/17/19 2009   07/17/19 1857  vancomycin (VANCOCIN) powder  Status:  Discontinued       As needed 07/17/19 1858 07/17/19 2009   07/10/19 1600  metroNIDAZOLE (FLAGYL) IVPB 500 mg  Status:  Discontinued     500 mg 100 mL/hr over 60 Minutes Intravenous Every 8 hours 07/10/19 1549 07/13/19 0939   07/09/19 0839  vancomycin (VANCOCIN) powder  Status:  Discontinued       As needed 07/09/19 0839 07/09/19 0935   07/09/19 0839  tobramycin (NEBCIN) powder  Status:  Discontinued       As needed 07/09/19 0840 07/09/19 0935   07/07/19 1830  ceFAZolin (ANCEF) IVPB 2g/100 mL premix     2 g 200 mL/hr over 30 Minutes Intravenous Every 8 hours 07/07/19 1333     07/07/19 0900  ceFAZolin (ANCEF) IVPB 2g/100 mL premix  Status:  Discontinued     2 g 200 mL/hr over 30 Minutes Intravenous Every 8 hours 07/07/19 0847 07/07/19 1333   07/06/19 2030  clindamycin (CLEOCIN) IVPB 600 mg  Status:  Discontinued     600 mg 100 mL/hr over 30 Minutes Intravenous Every 8 hours 07/06/19 2010 07/07/19 0847   07/06/19 1800  vancomycin (VANCOCIN) 1,500 mg in sodium chloride 0.9 % 500 mL IVPB  Status:  Discontinued     1,500 mg 250 mL/hr over 120 Minutes Intravenous Every 12 hours 07/06/19 0820 07/07/19 0847   07/06/19 1400  ceFEPIme (MAXIPIME) 2 g in sodium chloride 0.9 % 100 mL IVPB  Status:  Discontinued     2 g 200 mL/hr over 30 Minutes Intravenous Every 8 hours 07/06/19 0504 07/07/19 0847   07/06/19 1400  vancomycin (VANCOCIN) 2,000 mg in sodium chloride 0.9 % 500 mL IVPB  Status:  Discontinued     2,000 mg 250 mL/hr over 120 Minutes Intravenous Every 8 hours 07/06/19 0504 07/06/19 0818   07/06/19 0745  clindamycin (CLEOCIN) IVPB 600 mg  Status:  Discontinued     600 mg 100 mL/hr over 30 Minutes Intravenous  Once 07/06/19 0737 07/06/19 2029   07/06/19 0500  ceFEPIme (MAXIPIME) 2 g in sodium chloride 0.9 % 100 mL IVPB     2 g 200 mL/hr over 30 Minutes Intravenous  Once 07/06/19 0451 07/06/19  1335   07/06/19 0500  vancomycin (VANCOCIN) 2,500 mg in sodium chloride 0.9 % 500 mL IVPB     2,500 mg 250 mL/hr over 120 Minutes Intravenous  Once 07/06/19 0451 07/06/19 1335      Procedures: 9/4>> CT-guided aspiration of right paraspinal abscess at L4-L5 07/09/19>>  incision and drainage of the right hip/pelvic abscess along with neuroplasty of the right sciatic nerve by neurosurgery 9/9>>TEE 07/17/2019>>I&D of right gluteal abscess with drainage of 300 cc purulent fluid.   07/20/19>> Repeat irrigation and debridement right hip and application of wound VAC  07/27/19>> incision and drainage of posterior right thigh abscess -wound VAC placement 9/28>> repeat irrigation and debridement of right thigh abscess, incisional wound VAC placement.   CONSULTS:  ID,  orthopedic surgery and Neurosurgery  Time spent: 25- minutes-Greater than 50% of this time was spent in counseling, explanation of diagnosis, planning of further management, and coordination of care.  MEDICATIONS: Scheduled Meds:  sodium chloride   Intravenous Once   sodium chloride   Intravenous Once   acetaminophen  1,000 mg Oral TID   Chlorhexidine Gluconate Cloth  6 each Topical Daily   gabapentin  300 mg Oral TID   hydrALAZINE  25 mg Oral BID   influenza vac split quadrivalent PF  0.5 mL Intramuscular Tomorrow-1000   insulin aspart  0-15 Units Subcutaneous TID WC   insulin aspart  0-5 Units Subcutaneous QHS   insulin aspart  8 Units Subcutaneous TID WC   insulin NPH Human  28 Units Subcutaneous BID AC & HS   iron polysaccharides  150 mg Oral Daily   methocarbamol  750 mg Oral TID   metoCLOPramide  10 mg Oral TID AC & HS   polyethylene glycol  17 g Oral BID   senna-docusate  1 tablet Oral BID   sodium chloride flush  10-40 mL Intracatheter Q12H   Continuous Infusions:   ceFAZolin (ANCEF) IV 2 g (08/03/19 0947)   heparin 2,500 Units/hr (08/03/19 0943)   PRN Meds:.albuterol, diphenhydrAMINE,  hydrALAZINE, HYDROmorphone, ondansetron **OR** ondansetron (ZOFRAN) IV, oxyCODONE, sodium chloride flush, zolpidem   PHYSICAL EXAM: Vital signs: Vitals:   08/03/19 0054 08/03/19 0351 08/03/19 0735 08/03/19 1147  BP:   124/76 116/70  Pulse: 88  96 94  Resp: 14 17 16 18   Temp: 99.3 F (37.4 C) 98.7 F (37.1 C) 98.9 F (37.2 C) 98.7 F (37.1 C)  TempSrc: Oral Oral Oral Oral  SpO2: 95% 97% 97% 94%  Weight:      Height:       Filed Weights   07/05/19 1807 07/06/19 2116 07/27/19 1113  Weight: (!) 167.4 kg (!) 164 kg (!) 164 kg   Body mass index is 41.78 kg/m.   Gen Exam:Alert awake-not in any distress HEENT:atraumatic, normocephalic Chest: B/L clear to auscultation anteriorly CVS:S1S2 regular Abdomen:soft non tender, non distended Extremities:no edema Neurology: Non focal Skin: no rash I have personally reviewed following labs and imaging studies  LABORATORY DATA: CBC: Recent Labs  Lab 07/28/19 0553 07/29/19 0644  07/30/19 0550 07/31/19 0745 07/31/19 2158 08/01/19 0547 08/02/19 0430 08/03/19 0711  WBC 8.8 6.4  --  6.5 5.5  --  9.5 6.1 6.0  NEUTROABS 6.1 4.0  --  4.4 3.4  --  7.0  --   --   HGB 7.3* 6.7*   < > 7.4* 6.9* 7.4* 7.7* 7.6* 8.0*  HCT 22.6* 20.3*   < > 23.2* 21.5* 22.8* 23.9* 24.7* 25.8*  MCV 93.8 93.5  --  94.3 96.0  --  96.8 99.2 95.9  PLT 263 267  --  299 299  --  302 279 294   < > = values in this interval not displayed.    Basic Metabolic Panel: Recent Labs  Lab 07/29/19 0644 07/30/19 0550 07/31/19 0745 08/01/19 0547 08/02/19 0430  NA 134* 133* 134* 133* 135  K 3.6 3.6 3.8 3.5 3.4*  CL 91* 93* 94* 95* 98  CO2 31 31 29 29 28   GLUCOSE 109* 133* 183* 92 136*  BUN <5* <5* <5* 5* <5*  CREATININE 0.49* 0.51* 0.55* 0.61 0.65  CALCIUM 8.6* 8.5* 8.6* 8.1* 8.3*    GFR: Estimated Creatinine Clearance: 188.1 mL/min (by C-G formula based on SCr of 0.65 mg/dL).  Liver Function Tests: No results for input(s): AST, ALT, ALKPHOS, BILITOT, PROT,  ALBUMIN in the last 168 hours. No results for input(s): LIPASE, AMYLASE in the last 168 hours. No results for input(s): AMMONIA in the last 168 hours.  Coagulation Profile: No results for input(s): INR, PROTIME in the last 168 hours.  Cardiac Enzymes: No results for input(s): CKTOTAL, CKMB, CKMBINDEX, TROPONINI in the last 168 hours.  BNP (last 3 results) No results for input(s): PROBNP in the last 8760 hours.  HbA1C: No results for input(s): HGBA1C in the last 72 hours.  CBG: Recent Labs  Lab 08/02/19 1226 08/02/19 1614 08/02/19 2133 08/03/19 0733 08/03/19 1146  GLUCAP 137* 97 129* 109* 110*    Lipid Profile: No results for input(s): CHOL, HDL, LDLCALC, TRIG, CHOLHDL, LDLDIRECT in the last 72 hours.  Thyroid Function Tests: No results for input(s): TSH, T4TOTAL, FREET4, T3FREE, THYROIDAB in the last 72 hours.  Anemia Panel: No results for input(s): VITAMINB12, FOLATE, FERRITIN, TIBC, IRON, RETICCTPCT in the last 72 hours.  Urine analysis:    Component Value Date/Time   COLORURINE YELLOW 07/06/2019 [place] 07/06/2019 0437   LABSPEC 1.024 07/06/2019 0437   PHURINE 5.0 07/06/2019 0437   GLUCOSEU >=500 (A) 07/06/2019 0437   HGBUR MODERATE (A) 07/06/2019 0437   BILIRUBINUR NEGATIVE 07/06/2019 0437   KETONESUR 80 (A) 07/06/2019 0437   PROTEINUR NEGATIVE 07/06/2019 0437   NITRITE NEGATIVE 07/06/2019 0437   LEUKOCYTESUR NEGATIVE 07/06/2019 0437    Sepsis Labs: Lactic Acid, Venous    Component Value Date/Time   LATICACIDVEN 2.6 ([place]) 07/06/2019 2213    MICROBIOLOGY: Recent Results (from the past 240 hour(s))  Aerobic/Anaerobic Culture (surgical/deep wound)     Status: None   Collection Time: 07/27/19 12:56 PM   Specimen: PATH Other; Tissue  Result Value Ref Range Status   Specimen Description ABSCESS RIGHT THIGH  Final   Special Requests NONE  Final   Gram Stain NO WBC SEEN NO ORGANISMS SEEN   Final   Culture   Final    FEW  STAPHYLOCOCCUS AUREUS NO ANAEROBES ISOLATED Performed at [facility], [place] [address], [place], [place] [address]    Report Status 08/01/2019 FINAL  Final   Organism ID, Bacteria STAPHYLOCOCCUS AUREUS  Final      Susceptibility   Staphylococcus aureus - MIC*    CIPROFLOXACIN <=0.5 SENSITIVE Sensitive     ERYTHROMYCIN >=8 RESISTANT Resistant     GENTAMICIN <=0.5 SENSITIVE Sensitive     OXACILLIN <=0.25 SENSITIVE Sensitive     TETRACYCLINE >=16 RESISTANT Resistant     VANCOMYCIN 1 SENSITIVE Sensitive     TRIMETH/SULFA <=10 SENSITIVE Sensitive     CLINDAMYCIN RESISTANT Resistant     RIFAMPIN <=0.5 SENSITIVE Sensitive     Inducible Clindamycin POSITIVE Resistant     * FEW STAPHYLOCOCCUS AUREUS    RADIOLOGY STUDIES/RESULTS: Dg Abd 1 View  Result Date: 07/15/2019 CLINICAL DATA:  Abdominal pain. EXAM: ABDOMEN - 1 VIEW COMPARISON:  07/12/2019 FINDINGS: Nondistended gas-filled loops of small bowel and colon noted. Nondistended gas-filled stomach is present. No suspicious calcifications are present. Stool in the colon is present. IMPRESSION: Nonspecific nonobstructive bowel gas pattern with nondistended gas-filled stomach, small bowel and colon. Electronically Signed   By: Margarette Canada M.D.   On: 07/15/2019 17:02   Dg Abd 1 View  Result Date: 07/12/2019 CLINICAL DATA:  NG tube placement EXAM: ABDOMEN - 1 VIEW COMPARISON:  None. FINDINGS: Nasogastric tube tip and side  port project over the stomach. Lung bases are clear. No dilated bowel is visible. IMPRESSION: NG tube tip and side port project over the stomach. Electronically Signed   By: Ulyses Jarred M.D.   On: 07/12/2019 00:33   Dg Abd 1 View  Result Date: 07/11/2019 CLINICAL DATA:  Nausea and vomiting with abdominal pain EXAM: ABDOMEN - 1 VIEW COMPARISON:  07/06/2019 FINDINGS: Marked gaseous dilatation of the stomach. Otherwise nonobstructed gas pattern with gas in the small and large bowel. Findings could be secondary to enteritis or  mild ileus. IMPRESSION: 1. Incompletely visualized marked gaseous dilatation of stomach, consider NG tube decompression. Outlet obstruction could be considered. 2. Remainder of the gas pattern shows air-filled small and large bowel in a nonobstructed pattern, question enteritis or mild ileus. Electronically Signed   By: Donavan Foil M.D.   On: 07/11/2019 21:58   Ct Angio Chest Pe W Or Wo Contrast  Result Date: 08/01/2019 CLINICAL DATA:  48 year old male with syncope. EXAM: CT ANGIOGRAPHY CHEST WITH CONTRAST TECHNIQUE: Multidetector CT imaging of the chest was performed using the standard protocol during bolus administration of intravenous contrast. Multiplanar CT image reconstructions and MIPs were obtained to evaluate the vascular anatomy. CONTRAST:  129mL OMNIPAQUE IOHEXOL 350 MG/ML SOLN COMPARISON:  None. FINDINGS: Cardiovascular: Borderline cardiomegaly. No pericardial effusion. Multi vessel coronary vascular calcification. The thoracic aorta is unremarkable. Evaluation of the pulmonary arteries is somewhat limited due to suboptimal opacification and visualization of the peripheral branches and respiratory motion artifact. No large or central pulmonary artery embolus identified. Right sided PICC with tip close to the cavoatrial junction noted. Mediastinum/Nodes: There is no hilar or mediastinal adenopathy. The esophagus and the thyroid gland are grossly unremarkable. No mediastinal fluid collection. Lungs/Pleura: Bibasilar linear atelectasis/scarring. No lobar consolidation, pleural effusion pneumothorax. The central airways are patent. Upper Abdomen: Multiple gallstones. The visualized upper abdomen is otherwise unremarkable. Musculoskeletal: No chest wall abnormality. No acute or significant osseous findings. Review of the MIP images confirms the above findings. IMPRESSION: 1. No acute intrathoracic pathology. No CT evidence of central pulmonary artery embolus. 2. Cholelithiasis. Aortic Atherosclerosis  (ICD10-I70.0). Electronically Signed   By: Anner Crete M.D.   On: 08/01/2019 20:55   Mr Lumbar Spine W Wo Contrast  Result Date: 07/06/2019 CLINICAL DATA:  Infection.  Rule out epidural abscess. EXAM: MRI LUMBAR SPINE WITHOUT AND WITH CONTRAST TECHNIQUE: Multiplanar and multiecho pulse sequences of the lumbar spine were obtained without and with intravenous contrast. CONTRAST:  10 mL Gadovist IV COMPARISON:  CT abdomen pelvis in CT lumbar spine 07/06/2019 FINDINGS: Segmentation:  Normal Alignment:  Mild retrolisthesis L1-2 and L4-5 and L5-S1. Vertebrae: Negative for vertebral fracture or mass. No vertebral edema or abnormal enhancement. No evidence of discitis. Conus medullaris and cauda equina: Conus extends to the L1-2 level. Conus and cauda equina appear normal. Paraspinal and other soft tissues: Large enhancing fluid collection in the right paraspinous muscle at the L4 level measures 5.5 x 6 cm compatible with abscess. Adjacent facet joints show mild degeneration but no definite infection. Multilocular large fluid collection in the right iliopsoas muscle compatible with abscess. This measures at least 5 x 6 cm. Large multilocular fluid collection in the right gluteus muscle measures at least 5 cm. Abscess in the iliopsoas and gluteus muscle appear to communicate through the sciatic notch. Disc levels: L1-2: Disc and facet degeneration. Shallow left-sided disc protrusion with mild subarticular stenosis on the left. L2-3: Negative L3-4: Normal disc space. Small epidural abscess posteriorly on the  left. Bilateral facet degeneration without evidence of facet joint infection. L4-5: Mild retrolisthesis. Disc degeneration and facet degeneration. Small extraforaminal disc protrusion on the left. Epidural abscess posteriorly on the left compressing the thecal sac and contributing to moderate spinal stenosis. No evidence of facet joint infection. L5-S1: Disc degeneration with left-sided disc protrusion. Posterior  epidural abscess is ill-defined but is contributing to compression of thecal sac. There is enhancement and fluid along the right S1 nerve root which may be the route of spread to the epidural space. No evidence of facet infection. IMPRESSION: 1. Multiple areas of infection. Large areas of abscess formation in the right paraspinous muscle at the L4 level. Large abscess in the right iliopsoas muscle and also in the right gluteus muscle. 2. 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 This may have entered the spinal canal via the right S1 nerve root. No evidence of discitis or facet joint infection. Electronically Signed   By: Franchot Gallo M.D.   On: 07/06/2019 13:34   Mr Pelvis W Wo Contrast  Result Date: 07/06/2019 CLINICAL DATA:  Right-sided low back pain for several weeks. No recent injury. Recent antibiotic therapy for urinary tract infection. EXAM: MRI PELVIS WITHOUT AND WITH CONTRAST TECHNIQUE: Multiplanar multisequence MR imaging of the pelvis was performed both before and after administration of intravenous contrast. CONTRAST:  10 cc Gadavist COMPARISON:  Abdominopelvic CT 07/06/2019 and 06/18/2019. FINDINGS: Urinary Tract: The visualized distal ureters and bladder appear unremarkable. Bowel: No bowel wall thickening, distention or surrounding inflammation identified within the pelvis. Vascular/Lymphatic: No enlarged pelvic lymph nodes identified. No significant vascular findings. Reproductive: There are areas T2 hyperintensity and decreased enhancement centrally in the prostate gland bilaterally which may be secondary to benign prosthetic hypertrophy or prostatitis. There is no surrounding inflammatory change. The seminal vesicles appear normal. Other: No ascites. Musculoskeletal: As demonstrated on earlier CT scan, there are multiple complex fluid collections surrounding the right sacroiliac joint. These demonstrate internal air bubbles, heterogeneity and irregular peripheral enhancement.  Dominant component laterally in the gluteus musculature measures up to 9.8 x 6.1 cm on image 18/13. There is a component in the right issue femoral fossa measuring up to 5.2 cm on image 27/3. This demonstrates inferior extension around the right common hamstring tendon. There are intrapelvic components extending through the greater sciatic foramen, involving the right piriformis muscle and measuring up to 6.3 cm on image 13/12. There are also irregular components within the right iliopsoas muscle measuring up to 3.8 cm on image 9/13. There are right posterior paraspinal components within the erector spinae musculature, measuring up to 6.2 cm on image 1/13. There is mild T2 hyperintensity and enhancement within the right sacroiliac joint, with associated marrow edema and enhancement in the adjacent right sacral ala. T2 hyperintensity and enhancement are also present within the left proximal common hamstring muscle. There are no focal fluid collections in the left pelvis. No significant hip joint effusion or abnormal synovial enhancement. IMPRESSION: 1. As seen on earlier CT, there are multiple complex fluid collections in the right pelvis with internal air and peripheral enhancement consistent with multifocal abscesses. 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 2. Inflammation extends into the erector spinae musculature on the right. Lumbar spine findings are dictated separately. 3. Mild T2 hyperintensity within the proximal left common hamstring muscle. 4. Central heterogeneity within the prostate gland is  removed from these other findings and probably due to BPH. Prostatitis could have this appearance. Electronically Signed   By: Richardean Sale M.D.   On: 07/06/2019 13:32   Ct Abdomen Pelvis W Contrast  Addendum Date: 07/06/2019   ADDENDUM REPORT: 07/06/2019 08:20  ADDENDUM: Original report by Dr. Golden Circle. Addendum by Dr. Jeralyn Ruths for contrast extravasation documentation: Type of contrast:  Isovue 300 Site of extravasation: Right AC Estimated volume of extravasation: 40 ml Area of extravasation scanned with CT? no PATIENT'S SIGNS AND SYMPTOMS Skin blistering/ulceration: no Decrease capillary refill: no Change in skin color: no Decreased motor function or severe tightness: no Decreased pulses distal to site of extravasation: no Altered sensation: no Increasing pain or signs of increased swelling during observation: no TREATMENT An ice pack was applied with instructions given to the patient for limb elevation and warning signs to look for. The patient was returned to the emergency department. Electronically Signed   By: Logan Bores M.D.   On: 07/06/2019 08:20   Result Date: 07/06/2019 CLINICAL DATA:  Right-sided pain for several weeks, initial encounter EXAM: CT ABDOMEN AND PELVIS WITH CONTRAST TECHNIQUE: Multidetector CT imaging of the abdomen and pelvis was performed using the standard protocol following bolus administration of intravenous contrast. CONTRAST:  117mL ISOVUE-300 IOPAMIDOL (ISOVUE-300) INJECTION 61% COMPARISON:  06/18/2019 FINDINGS: Lower chest: Lung bases are well aerated bilaterally. There is a vague nodular density identified on the first image within the right middle lobe. This area was not covered on the prior exam. Hepatobiliary: Liver is diffusely decreased in attenuation consistent with fatty infiltration. The gallbladder is decompressed. Scattered calcifications are noted within consistent with stones. No obstructive changes are noted. Pancreas: Unremarkable. No pancreatic ductal dilatation or surrounding inflammatory changes. Spleen: Normal in size without focal abnormality. Adrenals/Urinary Tract: Adrenal glands are within normal limits. The kidneys are well visualized with renal calculi stable in appearance from the prior exam. The largest of these lies  in the lower pole measuring approximately 9 mm. The left kidney demonstrates no renal calculi. Very mild prominence of the collecting system and ureter on the right are seen similar to that noted on the prior exam without obstructing lesion. The bladder is well distended. Stomach/Bowel: No obstructive or inflammatory changes of the colon are seen. The appendix is not well visualized. No obstructive or inflammatory changes of the small bowel are seen. The stomach is unremarkable. Vascular/Lymphatic: No significant vascular findings are present. No enlarged abdominal or pelvic lymph nodes. Reproductive: Prostate is unremarkable. Other: No abdominal wall hernia or abnormality. No abdominopelvic ascites. Musculoskeletal: There are changes within the right buttock, right iliac fossa and perispinal musculature on the right with fluid collections and air foci identified consistent with localized infection. The fluid collection within the right paraspinal musculature measures 5.6 x 5.5 cm consistent with a focal abscess. Similar extension into the iliac fossa on the right as well as the musculature of the right buttocks is seen. Soft tissue density in the presacral region and along the right pelvic wall are noted consistent with focal localized infection as well. IMPRESSION: Changes consistent with localized abscess involving the right iliac fossa, musculature of the right buttocks and right paraspinal musculature with extension into the lateral aspect of the pelvis on the right. Percutaneous drainage of the paraspinal collection may be helpful. These changes are all new from the prior exam. Stable renal calculi on the right. Stable cholelithiasis and fatty infiltration of the liver. Electronically Signed: By: Inez Catalina M.D. On: 07/06/2019  07:55   Ct Hip Right Wo Contrast  Result Date: 07/30/2019 CLINICAL DATA:  Right hip abscess. EXAM: CT OF THE RIGHT HIP WITHOUT CONTRAST TECHNIQUE: Multidetector CT imaging of the  right hip was performed according to the standard protocol. Multiplanar CT image reconstructions were also generated. COMPARISON:  None. FINDINGS: Bones/Joint/Cartilage Widening of the right sacroiliac joint with erosive changes most concerning for septic arthritis. Nondisplaced fracture of the inferior aspect of the right sacrum adjacent to the sacroiliac joint. Subchondral fracture involving the right ilium along the right SI joint. Small amount air along the anterior margin of the right sacroiliac joint within the iliopsoas muscle. No acute fracture or dislocation.  Normal alignment. Ligaments Ligaments are suboptimally evaluated by CT. Soft tissue, Muscles and Tendons Soft tissue wound overlying the right greater trochanter with air within the subcutaneous fat. There has been significant interval improvement in the complex fluid collection involving the right gluteal musculature extending into the pelvis. Small amount air in the left semimembranosus muscle partially visualized. No fluid collection or hematoma.  No soft tissue mass. IMPRESSION: 1. 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 Interval development of a subchondral fracture involving the right ilium and a nondisplaced fracture along the inferior aspect of the right sacrum. 2. Soft tissue wound overlying the right greater trochanter with air within the subcutaneous fat likely postsurgical. Significant interval improvement in the complex fluid collection involving the right gluteal musculature extending into the pelvis. 3. Small amount air in the left semimembranosus muscle partially visualized most concerning for persistent infection in the absence recent instrumentation involving this area. Electronically Signed   By: Kathreen Devoid   On: 07/30/2019 16:56   Mr Tibia Fibula Right W Wo Contrast  Result Date: 07/26/2019 CLINICAL DATA:  drainage posteromedial R knee, eval for abscess. Contacted by nurse regarding new drainage from  posteromedial proximal lower R leg. Started this am Pt notes pain only when touching areaNo other concerns elsewhere Currently on ancef for MSSA from R gluteal/hip abscess EXAM: MRI OF LOWER RIGHT EXTREMITY WITHOUT AND WITH CONTRAST TECHNIQUE: Multiplanar, multisequence MR imaging of the right lower extremity was performed both before and after administration of intravenous contrast. CONTRAST:  56mL GADAVIST GADOBUTROL 1 MMOL/ML IV SOLN COMPARISON:  None. FINDINGS: Soft tissues: Large complex fluid collection extends from the thigh across the posterior knee. It begins above the included field of view and thus is entire extent is not imaged on this exam. The collection terminates in the posterosuperior calf, below the knee. There is peripheral wall enhancement and septal enhancement throughout the collection. Small foci of signal void are noted within the fluid collection consistent with bubbles of air. Transversely, the collection measures a maximum of 11 x 9 cm. The collection is at least 27 cm from superior to inferior, but extends above the included field of view and therefore is longer from superior to inferior. The collection is centered on the hamstrings. The neurovascular structures are displaced anteriorly. There is diffuse surrounding soft tissue edema/cellulitis. Osseous structures: Normal signal. No evidence of edema or osteomyelitis. Right knee joint is normally spaced and aligned. No joint effusion. Visualized tendons are intact, normal in signal. IMPRESSION: 1. Large complex collection consistent with an abscess extending throughout the entire visible posterior thigh, centered on the posterior compartment involving the hamstring musculature. The collection measures 11 x 9 cm transversely. It extends through the visualized thigh across the knee to the upper leg. There is surrounding soft tissue edema/cellulitis. 2. No evidence of  osteomyelitis. Knee joint is unremarkable with no effusion to suggest  septic arthritis. Electronically Signed   By: Lajean Manes M.D.   On: 07/26/2019 21:17   Ct Aspiration  Result Date: 07/06/2019 INDICATION: 48 year old with multiple sites of infection including a lumbar paraspinal muscular abscess. Plan for CT-guided aspiration of the paraspinal abscess collection. EXAM: CT-GUIDED ASPIRATION OF PARASPINAL ABSCESS MEDICATIONS: Moderate sedation ANESTHESIA/SEDATION: Fentanyl 100 mcg IV; Versed 2.0 mg IV Moderate Sedation Time:  10 minutes The patient was continuously monitored during the procedure by the interventional radiology nurse under my direct supervision. COMPLICATIONS: None immediate. PROCEDURE: Informed written consent was obtained from the patient after a thorough discussion of the procedural risks, benefits and alternatives. All questions were addressed. A timeout was performed prior to the initiation of the procedure. Patient was placed prone on the CT scanner. CT images through the lower abdomen were obtained. The right paraspinal muscular abscess was identified at the L4-L5 level. Overlying skin was prepped with chlorhexidine. Sterile field was created. Skin was anesthetized with 1% lidocaine. Yueh catheter was directed into the abscess collection with CT guidance. Approximately 15 mL of brown purulent fluid was aspirated. No additional fluid could be aspirated. At this point in the procedure the patient was in severe pain from lying prone and the procedure was stopped. Bandage placed over the puncture site. FINDINGS: Low-density abscess in the right paraspinal musculature at L4-L5. 15 mL of brown purulent fluid was removed from this collection. IMPRESSION: CT-guided aspiration of the right paraspinal abscess at L4-L5. 15 mL of purulent fluid was removed and sent for culture. Electronically Signed   By: Markus Daft M.D.   On: 07/06/2019 19:22   Ct L-spine No Charge  Result Date: 07/06/2019 CLINICAL DATA:  Right-sided back pain and right lower extremity pain for 3  weeks. EXAM: CT LUMBAR SPINE WITHOUT CONTRAST TECHNIQUE: Multidetector CT imaging of the lumbar spine was performed without intravenous contrast administration. Multiplanar CT image reconstructions were also generated. COMPARISON:  None. FINDINGS: Segmentation: 5 lumbar type vertebrae. Alignment: Normal. Vertebrae: No acute fracture or focal pathologic process. Paraspinal and other soft tissues: There is gas in the paraspinal soft tissues with a 5.6 cm fluid collection in the right multifidus muscle at the L4-5 level. There is gas in the right iliacus and psoas muscles and right gluteus minimus and medius muscles with expansion of those muscles with low-density consistent with abscess. There is also fluid and abnormal soft tissue in the presacral space extending through the right sciatic notch. There is a tiny bubble of air in the right hip joint. Cholelithiasis. 5 mm stone in the mid right kidney. Small right inguinal hernia containing only fat. Disc levels: T7-8 through L3-4: No appreciable disc protrusions. The discs at T9-10 and T11-12 are degenerated with vacuum phenomena. L4-5: Small broad-based disc bulge. Slight hypertrophy of the left ligamentum flavum creates narrowing of the spinal canal. The CT thecal sac appears compressed in the transverse dimension. L5-S1: Disc space narrowing asymmetric disc osteophyte complex to the left of midline. There appears to be abnormal soft tissue in the spinal canal at S1. Given the adjacent soft tissue infection, the possibility of infection in the spinal canal should be considered. MRI of the lower abdomen and pelvis and lumbar spine recommended for further evaluation of the findings to determine whether there is epidural extension of infection. IMPRESSION: 1. Extensive soft tissue infection in bulging the right paraspinal muscles, right gluteal muscles, presacral space, and the soft tissues of the right  sciatic notch. There is a definable abscess in the right multifidus  muscle. 2. Subtle increased soft tissue in the spinal canal at S1. Asymmetric compression of the thecal sac at L4-5. The possibility of infection within the spinal canal should be considered. 3. MRI of the lumbar spine with and without contrast and MRI of the lower abdomen and pelvis with and without contrast recommended for further evaluation. Critical Value/emergent results were called by telephone at the time of interpretation on 07/06/2019 at 8:17 am to the attending ER physician , who verbally acknowledged these results. Electronically Signed   By: Lorriane Shire M.D.   On: 07/06/2019 08:16   Dg Chest Portable 1 View  Result Date: 07/06/2019 CLINICAL DATA:  Shortness of breath. EXAM: PORTABLE CHEST 1 VIEW COMPARISON:  None. FINDINGS: Mild cardiomegaly. Normal mediastinal contours. No focal airspace disease, pleural effusion, or pneumothorax. No pulmonary edema. No acute osseous abnormalities. IMPRESSION: Mild cardiomegaly. No acute chest findings. Electronically Signed   By: Keith Rake M.D.   On: 07/06/2019 04:10   Vas [place] Lower Extremity Venous (dvt)  Result Date: 08/01/2019  Lower Venous Study Indications: Follow up dvt.  Limitations: Bandages and open wound. Comparison Study: previous study done 9/25 Performing Technologist: Abram Sander RVS  Examination Guidelines: A complete evaluation includes B-mode imaging, spectral Doppler, color Doppler, and power Doppler as needed of all accessible portions of each vessel. Bilateral testing is considered an integral part of a complete examination. Limited examinations for reoccurring indications may be performed as noted.  +---------+---------------+---------+-----------+----------+-----------------+  RIGHT     Compressibility Phasicity Spontaneity Properties Thrombus Aging     +---------+---------------+---------+-----------+----------+-----------------+  CFV       Full            Yes       Yes                                        +---------+---------------+---------+-----------+----------+-----------------+  SFJ       Full                                                                +---------+---------------+---------+-----------+----------+-----------------+  FV Prox   Full                                                                +---------+---------------+---------+-----------+----------+-----------------+  FV Mid    Full                                                                +---------+---------------+---------+-----------+----------+-----------------+  FV Distal Full                                                                +---------+---------------+---------+-----------+----------+-----------------+  POP                                                        Not visualized     +---------+---------------+---------+-----------+----------+-----------------+  PTV       None                                             Age Indeterminate  +---------+---------------+---------+-----------+----------+-----------------+  PERO                                                       Not visualized     +---------+---------------+---------+-----------+----------+-----------------+   +----+---------------+---------+-----------+----------+--------------+  LEFT Compressibility Phasicity Spontaneity Properties Thrombus Aging  +----+---------------+---------+-----------+----------+--------------+  CFV  Full            Yes       Yes                                    +----+---------------+---------+-----------+----------+--------------+     Summary: Right: Findings consistent with age indeterminate deep vein thrombosis involving the right posterior tibial veins. No cystic structure found in the popliteal fossa. Left: No evidence of common femoral vein obstruction.  *See table(s) above for measurements and observations. Electronically signed by Harold Barban MD on 08/01/2019 at 4:08:15 PM.    Final    Vas [place] Lower Extremity Venous  (dvt)  Result Date: 07/28/2019  Lower Venous Study Indications: Edema, abcess right proximal calf, and Pain.  Comparison Study: 07/11/19 RLE negative Performing Technologist: June Leap RDMS, RVT  Examination Guidelines: A complete evaluation includes B-mode imaging, spectral Doppler, color Doppler, and power Doppler as needed of all accessible portions of each vessel. Bilateral testing is considered an integral part of a complete examination. Limited examinations for reoccurring indications may be performed as noted.  +---------+---------------+---------+-----------+----------+--------------+  RIGHT     Compressibility Phasicity Spontaneity Properties Thrombus Aging  +---------+---------------+---------+-----------+----------+--------------+  CFV       Full            Yes       Yes                                    +---------+---------------+---------+-----------+----------+--------------+  SFJ       Full                                                             +---------+---------------+---------+-----------+----------+--------------+  FV Prox   Full                                                             +---------+---------------+---------+-----------+----------+--------------+  FV Mid    Full                                                             +---------+---------------+---------+-----------+----------+--------------+  FV Distal Full                                                             +---------+---------------+---------+-----------+----------+--------------+  PFV       Full                                                             +---------+---------------+---------+-----------+----------+--------------+  POP                                                        Not visualized  +---------+---------------+---------+-----------+----------+--------------+  PTV       None                                             Acute            +---------+---------------+---------+-----------+----------+--------------+  PERO      Full                                                             +---------+---------------+---------+-----------+----------+--------------+   +----+---------------+---------+-----------+----------+--------------+  LEFT Compressibility Phasicity Spontaneity Properties                 +----+---------------+---------+-----------+----------+--------------+  CFV                                                   Not visualized  +----+---------------+---------+-----------+----------+--------------+     Summary: Right: Findings consistent with acute deep vein thrombosis involving the right posterior tibial veins.  *See table(s) above for measurements and observations. Electronically signed by Curt Jews MD on 07/28/2019 at 4:04:40 AM.    Final    Vas [place] Lower Extremity Venous (dvt)  Result Date: 07/09/2019  Lower Venous Study Indications: Pain.  Risk Factors: Abscess of spinal canal, right gluteal muscle and iliopsoas muscle. Comparison Study: No prior study on file Performing Technologist: Sharion Dove RVS  Examination Guidelines: A complete evaluation includes B-mode imaging, spectral Doppler, color Doppler, and power Doppler as needed of all accessible portions of each vessel. Bilateral testing is considered an integral part of a  complete examination. Limited examinations for reoccurring indications may be performed as noted.  +---------+---------------+---------+-----------+----------+--------------+  RIGHT     Compressibility Phasicity Spontaneity Properties Thrombus Aging  +---------+---------------+---------+-----------+----------+--------------+  CFV       Full            Yes       Yes                                    +---------+---------------+---------+-----------+----------+--------------+  SFJ       Full                                                              +---------+---------------+---------+-----------+----------+--------------+  FV Prox   Full                                                             +---------+---------------+---------+-----------+----------+--------------+  FV Mid    Full                                                             +---------+---------------+---------+-----------+----------+--------------+  FV Distal Full                                                             +---------+---------------+---------+-----------+----------+--------------+  PFV       Full                                                             +---------+---------------+---------+-----------+----------+--------------+  POP       Full            Yes       Yes                                    +---------+---------------+---------+-----------+----------+--------------+  PTV       Full                                                             +---------+---------------+---------+-----------+----------+--------------+  PERO      Full                                                             +---------+---------------+---------+-----------+----------+--------------+   +----+---------------+---------+-----------+----------+--------------+  LEFT Compressibility Phasicity Spontaneity Properties Thrombus Aging  +----+---------------+---------+-----------+----------+--------------+  CFV  Full            Yes       Yes                                    +----+---------------+---------+-----------+----------+--------------+     Summary: Right: There is no evidence of deep vein thrombosis in the lower extremity. Left: No evidence of common femoral vein obstruction.  *See table(s) above for measurements and observations. Electronically signed by Harold Barban MD on 07/09/2019 at 1:13:14 AM.    Final    [place] Ekg Site Rite  Result Date: 07/14/2019 If Site Rite image not attached, placement could not be confirmed due to current cardiac rhythm.    LOS: 28 days    Oren Binet, MD  [facility]  If 7PM-7AM, please contact night-coverage  Please page via [email or web address]  Go to [email or web address] and use [facility] universal password to access. If you do not have the password, please contact the hospital operator.  Locate the [facility] provider you are looking for under [facility] and page to a number that you can be directly reached. If you still have difficulty reaching the provider, please page the [facility] (Director on Call) for the Hospitalists listed on amion for assistance.  08/03/2019, 1:40 PM

## 2019-08-03 NOTE — Progress Notes (Signed)
Physical Therapy Treatment Patient Details Name: Bradley Cox MRN: [id number] DOB: May 31, 1971 Today's Date: 08/03/2019    History of Present Illness Patient is a 48 y/o male who presents with right flank pain. Admitted with disseminated MSSA infection including bacteremia, Lumbosacral spinal epidural abscess, paraspinal/large gluteal/pelvic abscess with sciatica impingement. s/p I&D large gluteal abscess and neuroplasty of sciatica nerve 9/7. s/p CT aspiration right paraspinal abscess. PMH includes DM, HTN, morbid obesity, OSA.    PT Comments    Initially pt was very painful post dressing change, but encouraged to get more pain meds and be ready to try at next arrival.  Emphasized sit to stand technique, bed mobility, progressing ambulation to the hall and back to the bed.    Follow Up Recommendations  CIR;Supervision/Assistance - 24 hour     Equipment Recommendations  Rolling walker with 5" wheels;Wheelchair (measurements PT);3in1 (PT)    Recommendations for Other Services       Precautions / Restrictions Precautions Precautions: Fall Precaution Comments: wound vac to R posterior thigh/behind knee Restrictions RLE Weight Bearing: Weight bearing as tolerated    Mobility  Bed Mobility Overal bed mobility: Needs Assistance Bed Mobility: Supine to Sit     Supine to sit: Min assist(limited elevation HOB) Sit to supine: Min assist   General bed mobility comments: stability assist at trunk, minimal assist at R LE  Transfers Overall transfer level: Needs assistance Equipment used: Rolling walker (2 wheeled) Transfers: Sit to/from Stand Sit to Stand: From elevated surface;+2 physical assistance;Mod assist;Min assist         General transfer comment: cues for best technique.  Assist to come forward and boost.  Ambulation/Gait Ambulation/Gait assistance: +2 safety/equipment Gait Distance (Feet): 38 Feet Assistive device: Rolling walker (2 wheeled) Gait  Pattern/deviations: Step-through pattern;Decreased stride length;Trunk flexed Gait velocity: decreased   General Gait Details: cues for sequencing, posture.  Wide BOS gait, several rests   Stairs             Wheelchair Mobility    Modified Rankin (Stroke Patients Only)       Balance Overall balance assessment: Needs assistance Sitting-balance support: No upper extremity supported;Single extremity supported Sitting balance-Leahy Scale: Fair Sitting balance - Comments: able to sit without UE assist at EOB Postural control: Left lateral lean   Standing balance-Leahy Scale: Poor Standing balance comment: reliant on UE's                             Cognition Arousal/Alertness: Awake/alert Behavior During Therapy: WFL for tasks assessed/performed Overall Cognitive Status: Within Functional Limits for tasks assessed                                        Exercises      General Comments        Pertinent Vitals/Pain Pain Assessment: Faces Faces Pain Scale: Hurts little more Pain Location: R LE with movement Pain Descriptors / Indicators: Grimacing;Guarding Pain Intervention(s): Premedicated before session    Home Living                      Prior Function            PT Goals (current goals can now be found in the care plan section) Acute Rehab PT Goals Patient Stated Goal: to walk down the hall PT Goal Formulation:  With patient Time For Goal Achievement: 08/08/19 Potential to Achieve Goals: Good Progress towards PT goals: Progressing toward goals    Frequency    Min 3X/week      PT Plan Current plan remains appropriate    Co-evaluation              AM-PAC PT "6 Clicks" Mobility   Outcome Measure  Help needed turning from your back to your side while in a flat bed without using bedrails?: None Help needed moving from lying on your back to sitting on the side of a flat bed without using bedrails?: A  Little Help needed moving to and from a bed to a chair (including a wheelchair)?: A Little Help needed standing up from a chair using your arms (e.g., wheelchair or bedside chair)?: A Lot Help needed to walk in hospital room?: A Little Help needed climbing 3-5 steps with a railing? : Total 6 Click Score: 16    End of Session   Activity Tolerance: Patient tolerated treatment well Patient left: in bed;with call bell/phone within reach Nurse Communication: Mobility status PT Visit Diagnosis: Other abnormalities of gait and mobility (R26.89);Muscle weakness (generalized) (M62.81);Pain;Difficulty in walking, not elsewhere classified (R26.2) Pain - Right/Left: Right Pain - part of body: Hip     Time: [id number] PT Time Calculation (min) (ACUTE ONLY): 21 min  Charges:  $Gait Training: 8-22 mins                     08/03/2019  Donnella Sham, PT Acute Rehabilitation Services [phone]  (pager) [phone]  (office)   [name] 08/03/2019, 4:54 PM

## 2019-08-03 NOTE — Progress Notes (Signed)
Patient reassigned to 6N 31, initially to 3W17. Report called to Netta Corrigan receiving nurse. Patient and Mother at the bedside made aware of transfer and new room number provider. Verbalized understanding. Will transfer once finished eating dinner. Elita Boone, BSN, RN

## 2019-08-03 NOTE — Progress Notes (Signed)
Inpatient Rehabilitation Admissions Coordinator  Recommend home with [facility] and his 24/7 caregiver support that he has arranged. We will sign off at this time.  Danne Baxter, RN, MSN Rehab Admissions Coordinator [phone] 08/03/2019 6:26 PM

## 2019-08-03 NOTE — Progress Notes (Signed)
Ortho Trauma Note  S: Doing okay. Still with anemia. Started on heparin drip for tibial DVT. Incisional vac with another blockage overnight, has been resolved now. Vac continuing to have significant output. Incisional vac removed from hip yesterday due to having minimal ootput from that site. Incision began draining again overnight, patient frustrated about situation.  O: General - NAD, AAOx3 RLE - RLE incisional vac with excellent seal and function currently. Hip incision with serosanguinous drainage, dressing changed.  Moves leg much better. Motor and sensory function intact.   Results for orders placed or performed during the hospital encounter of 07/05/19 (from the past 24 hour(s))  Glucose, capillary     Status: Abnormal   Collection Time: 08/02/19 12:26 PM  Result Value Ref Range   Glucose-Capillary 137 (H) 70 - 99 mg/dL  Heparin level (unfractionated)     Status: Abnormal   Collection Time: 08/02/19  2:35 PM  Result Value Ref Range   Heparin Unfractionated 0.21 (L) 0.30 - 0.70 IU/mL  Glucose, capillary     Status: None   Collection Time: 08/02/19  4:14 PM  Result Value Ref Range   Glucose-Capillary 97 70 - 99 mg/dL  Glucose, capillary     Status: Abnormal   Collection Time: 08/02/19  9:33 PM  Result Value Ref Range   Glucose-Capillary 129 (H) 70 - 99 mg/dL   Comment 1 Notify RN    Comment 2 Document in Chart   Heparin level (unfractionated)     Status: Abnormal   Collection Time: 08/02/19 11:37 PM  Result Value Ref Range   Heparin Unfractionated 0.17 (L) 0.30 - 0.70 IU/mL  CBC     Status: Abnormal   Collection Time: 08/03/19  7:11 AM  Result Value Ref Range   WBC 6.0 4.0 - 10.5 K/uL   RBC 2.69 (L) 4.22 - 5.81 MIL/uL   Hemoglobin 8.0 (L) 13.0 - 17.0 g/dL   HCT 25.8 (L) 39.0 - 52.0 %   MCV 95.9 80.0 - 100.0 fL   MCH 29.7 26.0 - 34.0 pg   MCHC 31.0 30.0 - 36.0 g/dL   RDW 16.0 (H) 11.5 - 15.5 %   Platelets 294 150 - 400 K/uL   nRBC 0.0 0.0 - 0.2 %  Heparin level  (unfractionated)     Status: None   Collection Time: 08/03/19  7:11 AM  Result Value Ref Range   Heparin Unfractionated 0.34 0.30 - 0.70 IU/mL  Glucose, capillary     Status: Abnormal   Collection Time: 08/03/19  7:33 AM  Result Value Ref Range   Glucose-Capillary 109 (H) 70 - 99 mg/dL    Plan: Continue incisional vac for another 24-48 hours secondary to increased output. If vac fails may have to remove sooner. If blockage alert would recommend connecting to wall suction. Continue anticoagulation per hospitalist team.   Leary Roca. [facility] ?[phone]? (phone)

## 2019-08-03 NOTE — Progress Notes (Signed)
[place] for Heparin drip Indication: DVT  Allergies  Allergen Reactions  . Tramadol Anaphylaxis    Patient Measurements: Height: 6\' 6"  (198.1 cm) Weight: (!) 361 lb 8.9 oz (164 kg) IBW/kg (Calculated) : 91.4 Heparin Dosing Weight: 128 kg  Vital Signs: Temp: 98.9 F (37.2 C) (10/01 2009) Temp Source: Oral (10/01 2009) BP: 108/69 (10/01 2227) Pulse Rate: 101 (10/01 2009)  Labs: Recent Labs    07/31/19 0745 07/31/19 2158 08/01/19 0547 08/02/19 0147 08/02/19 0430 08/02/19 1435 08/02/19 2337  HGB 6.9* 7.4* 7.7*  --  7.6*  --   --   HCT 21.5* 22.8* 23.9*  --  24.7*  --   --   PLT 299  --  302  --  279  --   --   HEPARINUNFRC  --   --   --  0.44  --  0.21* 0.17*  CREATININE 0.55*  --  0.61  --  0.65  --   --     Estimated Creatinine Clearance: 188.1 mL/min (by C-G formula based on SCr of 0.65 mg/dL).  Assessment: 48 yo male with Right LE DVT. H/O anemia requiring blood transfusion. Patient to start on a heparin drip with goal HL at lower end of goal due to anemia per MD.  Heparin level is subtherapeutic at 0.17, on 2300 units/hr. No s/sx of bleeding or infusion issues.   Goal of Therapy:  Heparin level 0.3-0.5 Monitor platelets by anticoagulation protocol: Yes   Plan:  Increase heparin 2500 units/hr Order heparin level in 6 hours Monitor daily heparin level, CBC, and for s/sx of bleeding  Antonietta Jewel, PharmD, BCCCP Clinical Pharmacist   Please check AMION for all [facility] phone numbers After 10:00 PM, call [place] [phone] 08/03/2019 12:20 AM

## 2019-08-03 NOTE — Progress Notes (Signed)
Attempted report 

## 2019-08-04 LAB — GLUCOSE, CAPILLARY
Glucose-Capillary: 147 mg/dL — ABNORMAL HIGH (ref 70–99)
Glucose-Capillary: 92 mg/dL (ref 70–99)
Glucose-Capillary: 108 mg/dL — ABNORMAL HIGH (ref 70–99)
Glucose-Capillary: 130 mg/dL — ABNORMAL HIGH (ref 70–99)

## 2019-08-04 LAB — CBC
HCT: 24.4 % — ABNORMAL LOW (ref 39.0–52.0)
Hemoglobin: 7.4 g/dL — ABNORMAL LOW (ref 13.0–17.0)
MCH: 29.6 pg (ref 26.0–34.0)
MCHC: 30.3 g/dL (ref 30.0–36.0)
MCV: 97.6 fL (ref 80.0–100.0)
Platelets: 283 10*3/uL (ref 150–400)
RBC: 2.5 MIL/uL — ABNORMAL LOW (ref 4.22–5.81)
RDW: 15.6 % — ABNORMAL HIGH (ref 11.5–15.5)
WBC: 5.4 10*3/uL (ref 4.0–10.5)
nRBC: 0.4 % — ABNORMAL HIGH (ref 0.0–0.2)

## 2019-08-04 LAB — HEPARIN LEVEL (UNFRACTIONATED): Heparin Unfractionated: 0.38 IU/mL (ref 0.30–0.70)

## 2019-08-04 MED ORDER — RIVAROXABAN 20 MG PO TABS: 20.0000 mg | ORAL_TABLET | Freq: Every day | ORAL | Status: DC

## 2019-08-04 MED ORDER — RIVAROXABAN 15 MG PO TABS
15.0000 mg | ORAL_TABLET | Freq: Two times a day (BID) | ORAL | Status: DC
  Administered 2019-08-04 – 2019-08-06 (×5): 15 mg via ORAL
  Filled 2019-08-04 (×5): qty 1

## 2019-08-04 MED ORDER — RIVAROXABAN 15 MG PO TABS
15.0000 mg | ORAL_TABLET | Freq: Two times a day (BID) | ORAL | Status: DC
  Administered 2019-08-04: 15 mg via ORAL
  Filled 2019-08-04: qty 1

## 2019-08-04 NOTE — Progress Notes (Signed)
Phamacist called regarding Heparin level that is not in yet. Heparin drip connected to the Picc line, IV Rn said last night that he will inform Lab for this draw. Just called Phlebotomist Cloyde Reams), said she will come up in a few minutes.

## 2019-08-04 NOTE — Progress Notes (Addendum)
[place] for rivaroxaban Indication: DVT  Allergies  Allergen Reactions  . Tramadol Anaphylaxis    Patient Measurements: Height: 6\' 6"  (198.1 cm) Weight: (!) 361 lb 8.9 oz (164 kg) IBW/kg (Calculated) : 91.4  Vital Signs: Temp: 98.5 F (36.9 C) (10/03 0431) Temp Source: Oral (10/03 0431) BP: 127/74 (10/03 0431) Pulse Rate: 97 (10/03 0431)  Labs: Recent Labs    08/02/19 0430  08/02/19 2337 08/03/19 0711 08/03/19 1924  HGB 7.6*  --   --  8.0*  --   HCT 24.7*  --   --  25.8*  --   PLT 279  --   --  294  --   HEPARINUNFRC  --    < > 0.17* 0.34 0.29*  CREATININE 0.65  --   --   --   --    < > = values in this interval not displayed.    Estimated Creatinine Clearance: 188.1 mL/min (by C-G formula based on SCr of 0.65 mg/dL).  Assessment: 48 yo [place] with Right LE DVT. H/O anemia requiring blood transfusion. Pt has been on heparin infusion with last level therapeutic at 0.38. Pharmacy consulted to start rivaroxaban.  Hgb remains low-stable. Platelets are within normal limits and stable. No bleeding noted per RN. CrCl >15 ml/min.   Goal of Therapy:  Monitor platelets by anticoagulation protocol: Yes   Plan:  Start rivaroxaban 15 mg PO BID with meals today for 21 days, then on 10/24 start rivaroxaban 20 mg PO daily with supper Pharmacy will sign off and monitor CBC, renal fxn, and s/sx of bleeding peripherally   Berenice Bouton, PharmD PGY1 Pharmacy Resident Office phone: [phone] 08/04/2019, 6:52 AM

## 2019-08-04 NOTE — Progress Notes (Signed)
PROGRESS NOTE        PATIENT DETAILS Name: Bradley Cox Age: 48 y.o. Sex: male Date of Birth: [birth year] Admit Date: 07/05/2019 Admitting Physician Norval Morton, MD WE:[id number], Apolonio Schneiders, NP  Brief Narrative: Patient is a 48 y.o. 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  Has undergone serial debridement-evaluated by ID-with recommendations to continue with Ancef-stop date of 10/26.  Subjective: Pain in the right lower extremity is stable-no major issues overnight.  Assessment/Plan: Sepsis secondary to disseminated MSSA infection including bacteremia, L3-S1 paraspinal abscess, right psoas abscess right posterior thigh abscess: Sepsis pathophysiology has resolved-clinically improved-patient has undergone serial debridement of the right thigh abscess by orthopedics.  Patient is s/p CT-guided drainage of the paraspinal abscess by IR.  ID recommending to continue Ancef with stop date of 10/26.  Continues to have significant output from the [facility] site-approximately 325 cc (not charted accurately)-spoke with orthopedics PA-C-recommendations are to continue with the VAC-orthopedics will follow tomorrow-and make further recommendations.  Anemia: I suspect this is multifactorial-some amount of blood loss from repeated I&D's-but also probably from anemia related to critical illness.  Hemoglobin remains stable while on IV heparin, patient is s/p PRBC transfusion on 9/27 and 9/29.  Continue to monitor closely.   DVT right lower extremity: Probably provoked-given hospitalization/repeated surgeries.  Given limited ambulation-we will be at risk for further clots and propagation of this DVT.  Difficult situation-has severe anemia requiring 2 units of PRBC  transfusion-however after speaking with vascular surgery (Dr. Trula Slade on 9/30) and orthopedics (Dr. Doreatha Martin) we elected to start anticoagulation with IV heparin-patient subsequently tolerated IV heparin for the past few days-hemoglobin is stable-we will transition to Xarelto today.    DKA: Resolved  Insulin-dependent DM-2: CBGs currently stable-continue NPH 28 units twice daily, 8 units of NovoLog with meals and SSI  HTN: BP stable-continue hydralazine.  Asthma: Stable without any evidence of bronchospasm.  Obesity: Estimated body mass index is 41.78 kg/m as calculated from the following:   Height as of this encounter: 6\' 6"  (1.981 m).   Weight as of this encounter: 164 kg.   Diet: Diet Order            Diet Carb Modified Fluid consistency: Thin; Room service appropriate? Yes  Diet effective now               DVT Prophylaxis: IV heparin.  Code Status: Full code   Family Communication: None at bedside  Disposition Plan: Remain inpatient-Per CIR-not a candidate for inpatient rehab-we will plan on going home with home health services -hopefully in the next few days once output from [facility] is more stable.  Antimicrobial agents: Anti-infectives (From admission, onward)   Start     Dose/Rate Route Frequency Ordered Stop   08/01/19 1600  amoxicillin (AMOXIL) capsule 500 mg     500 mg Oral  Once 08/01/19 1509 08/01/19 1607   08/01/19 1315  penicillin G in sodium chloride 10,000 units/mL syringe for skin test - 1st bleb     1,000 Units Intradermal  Once 08/01/19 1042 08/01/19 1535   08/01/19 1315  penicillin G in sodium chloride 10,000 units/mL syringe for skin test - 2nd bleb  1,000 Units Intradermal  Once 08/01/19 1042 08/01/19 1537   08/01/19 1300  penicillin G in sodium chloride 10,000 units/mL syringe for skin test     1,000 Units Topical  Once 08/01/19 1042 08/01/19 1535   07/17/19 1857  gentamicin (GARAMYCIN) injection  Status:  Discontinued       As needed 07/17/19  1857 07/17/19 2009   07/17/19 1857  vancomycin (VANCOCIN) powder  Status:  Discontinued       As needed 07/17/19 1858 07/17/19 2009   07/10/19 1600  metroNIDAZOLE (FLAGYL) IVPB 500 mg  Status:  Discontinued     500 mg 100 mL/hr over 60 Minutes Intravenous Every 8 hours 07/10/19 1549 07/13/19 0939   07/09/19 0839  vancomycin (VANCOCIN) powder  Status:  Discontinued       As needed 07/09/19 0839 07/09/19 0935   07/09/19 0839  tobramycin (NEBCIN) powder  Status:  Discontinued       As needed 07/09/19 0840 07/09/19 0935   07/07/19 1830  ceFAZolin (ANCEF) IVPB 2g/100 mL premix     2 g 200 mL/hr over 30 Minutes Intravenous Every 8 hours 07/07/19 1333     07/07/19 0900  ceFAZolin (ANCEF) IVPB 2g/100 mL premix  Status:  Discontinued     2 g 200 mL/hr over 30 Minutes Intravenous Every 8 hours 07/07/19 0847 07/07/19 1333   07/06/19 2030  clindamycin (CLEOCIN) IVPB 600 mg  Status:  Discontinued     600 mg 100 mL/hr over 30 Minutes Intravenous Every 8 hours 07/06/19 2010 07/07/19 0847   07/06/19 1800  vancomycin (VANCOCIN) 1,500 mg in sodium chloride 0.9 % 500 mL IVPB  Status:  Discontinued     1,500 mg 250 mL/hr over 120 Minutes Intravenous Every 12 hours 07/06/19 0820 07/07/19 0847   07/06/19 1400  ceFEPIme (MAXIPIME) 2 g in sodium chloride 0.9 % 100 mL IVPB  Status:  Discontinued     2 g 200 mL/hr over 30 Minutes Intravenous Every 8 hours 07/06/19 0504 07/07/19 0847   07/06/19 1400  vancomycin (VANCOCIN) 2,000 mg in sodium chloride 0.9 % 500 mL IVPB  Status:  Discontinued     2,000 mg 250 mL/hr over 120 Minutes Intravenous Every 8 hours 07/06/19 0504 07/06/19 0818   07/06/19 0745  clindamycin (CLEOCIN) IVPB 600 mg  Status:  Discontinued     600 mg 100 mL/hr over 30 Minutes Intravenous  Once 07/06/19 0737 07/06/19 2029   07/06/19 0500  ceFEPIme (MAXIPIME) 2 g in sodium chloride 0.9 % 100 mL IVPB     2 g 200 mL/hr over 30 Minutes Intravenous  Once 07/06/19 0451 07/06/19 1335   07/06/19 0500   vancomycin (VANCOCIN) 2,500 mg in sodium chloride 0.9 % 500 mL IVPB     2,500 mg 250 mL/hr over 120 Minutes Intravenous  Once 07/06/19 0451 07/06/19 1335      Procedures: 9/4>> CT-guided aspiration of right paraspinal abscess at L4-L5 07/09/19>>  incision and drainage of the right hip/pelvic abscess along with neuroplasty of the right sciatic nerve by neurosurgery 9/9>>TEE 07/17/2019>>I&D of right gluteal abscess with drainage of 300 cc purulent fluid.   07/20/19>> Repeat irrigation and debridement right hip and application of wound VAC  07/27/19>> incision and drainage of posterior right thigh abscess -wound VAC placement 9/28>> repeat irrigation and debridement of right thigh abscess, incisional wound VAC placement.   CONSULTS:  ID, orthopedic surgery and Neurosurgery  Time spent: 25- minutes-Greater than 50% of this time was spent in counseling, explanation  of diagnosis, planning of further management, and coordination of care.  MEDICATIONS: Scheduled Meds:  sodium chloride   Intravenous Once   sodium chloride   Intravenous Once   acetaminophen  1,000 mg Oral TID   Chlorhexidine Gluconate Cloth  6 each Topical Daily   gabapentin  300 mg Oral TID   hydrALAZINE  25 mg Oral BID   influenza vac split quadrivalent PF  0.5 mL Intramuscular Tomorrow-1000   insulin aspart  0-15 Units Subcutaneous TID WC   insulin aspart  0-5 Units Subcutaneous QHS   insulin aspart  8 Units Subcutaneous TID WC   insulin NPH Human  28 Units Subcutaneous BID AC & HS   iron polysaccharides  150 mg Oral Daily   methocarbamol  750 mg Oral TID   metoCLOPramide  10 mg Oral TID AC & HS   polyethylene glycol  17 g Oral BID   rivaroxaban  15 mg Oral BID WC   Followed by   Derrill Memo ON 08/25/2019] rivaroxaban  20 mg Oral Q supper   senna-docusate  1 tablet Oral BID   sodium chloride flush  10-40 mL Intracatheter Q12H   Continuous Infusions:   ceFAZolin (ANCEF) IV 2 g (08/04/19 1041)   PRN  Meds:.albuterol, diphenhydrAMINE, hydrALAZINE, HYDROmorphone, ondansetron **OR** ondansetron (ZOFRAN) IV, oxyCODONE, sodium chloride flush, zolpidem   PHYSICAL EXAM: Vital signs: Vitals:   08/03/19 1640 08/03/19 1900 08/03/19 2150 08/04/19 0431  BP: (!) 143/92 104/62 114/64 127/74  Pulse: 97 (!) 105 100 97  Resp: (!) 21 18 16 18   Temp: 98 F (36.7 C) 100.1 F (37.8 C) 97.9 F (36.6 C) 98.5 F (36.9 C)  TempSrc: Oral Oral Oral Oral  SpO2: 96% 96% 96% 99%  Weight:      Height:       Filed Weights   07/05/19 1807 07/06/19 2116 07/27/19 1113  Weight: (!) 167.4 kg (!) 164 kg (!) 164 kg   Body mass index is 41.78 kg/m.   Gen Exam:Alert awake-not in any distress HEENT:atraumatic, normocephalic Chest: B/L clear to auscultation anteriorly CVS:S1S2 regular Abdomen:soft non tender, non distended Extremities:no edema Neurology: Non focal Skin: no rash  I have personally reviewed following labs and imaging studies  LABORATORY DATA: CBC: Recent Labs  Lab 07/29/19 0644  07/30/19 0550 07/31/19 0745 07/31/19 2158 08/01/19 0547 08/02/19 0430 08/03/19 0711 08/04/19 0813  WBC 6.4  --  6.5 5.5  --  9.5 6.1 6.0 5.4  NEUTROABS 4.0  --  4.4 3.4  --  7.0  --   --   --   HGB 6.7*   < > 7.4* 6.9* 7.4* 7.7* 7.6* 8.0* 7.4*  HCT 20.3*   < > 23.2* 21.5* 22.8* 23.9* 24.7* 25.8* 24.4*  MCV 93.5  --  94.3 96.0  --  96.8 99.2 95.9 97.6  PLT 267  --  299 299  --  302 279 294 283   < > = values in this interval not displayed.    Basic Metabolic Panel: Recent Labs  Lab 07/29/19 0644 07/30/19 0550 07/31/19 0745 08/01/19 0547 08/02/19 0430  NA 134* 133* 134* 133* 135  K 3.6 3.6 3.8 3.5 3.4*  CL 91* 93* 94* 95* 98  CO2 31 31 29 29 28   GLUCOSE 109* 133* 183* 92 136*  BUN <5* <5* <5* 5* <5*  CREATININE 0.49* 0.51* 0.55* 0.61 0.65  CALCIUM 8.6* 8.5* 8.6* 8.1* 8.3*    GFR: Estimated Creatinine Clearance: 188.1 mL/min (by C-G formula based on  SCr of 0.65 mg/dL).  Liver Function  Tests: No results for input(s): AST, ALT, ALKPHOS, BILITOT, PROT, ALBUMIN in the last 168 hours. No results for input(s): LIPASE, AMYLASE in the last 168 hours. No results for input(s): AMMONIA in the last 168 hours.  Coagulation Profile: No results for input(s): INR, PROTIME in the last 168 hours.  Cardiac Enzymes: No results for input(s): CKTOTAL, CKMB, CKMBINDEX, TROPONINI in the last 168 hours.  BNP (last 3 results) No results for input(s): PROBNP in the last 8760 hours.  HbA1C: No results for input(s): HGBA1C in the last 72 hours.  CBG: Recent Labs  Lab 08/03/19 0733 08/03/19 1146 08/03/19 1641 08/03/19 2202 08/04/19 0812  GLUCAP 109* 110* 101* 140* 147*    Lipid Profile: No results for input(s): CHOL, HDL, LDLCALC, TRIG, CHOLHDL, LDLDIRECT in the last 72 hours.  Thyroid Function Tests: No results for input(s): TSH, T4TOTAL, FREET4, T3FREE, THYROIDAB in the last 72 hours.  Anemia Panel: No results for input(s): VITAMINB12, FOLATE, FERRITIN, TIBC, IRON, RETICCTPCT in the last 72 hours.  Urine analysis:    Component Value Date/Time   COLORURINE YELLOW 07/06/2019 [place] 07/06/2019 0437   LABSPEC 1.024 07/06/2019 0437   PHURINE 5.0 07/06/2019 0437   GLUCOSEU >=500 (A) 07/06/2019 0437   HGBUR MODERATE (A) 07/06/2019 0437   BILIRUBINUR NEGATIVE 07/06/2019 0437   KETONESUR 80 (A) 07/06/2019 0437   PROTEINUR NEGATIVE 07/06/2019 0437   NITRITE NEGATIVE 07/06/2019 0437   LEUKOCYTESUR NEGATIVE 07/06/2019 0437    Sepsis Labs: Lactic Acid, Venous    Component Value Date/Time   LATICACIDVEN 2.6 ([place]) 07/06/2019 2213    MICROBIOLOGY: Recent Results (from the past 240 hour(s))  Aerobic/Anaerobic Culture (surgical/deep wound)     Status: None   Collection Time: 07/27/19 12:56 PM   Specimen: PATH Other; Tissue  Result Value Ref Range Status   Specimen Description ABSCESS RIGHT THIGH  Final   Special Requests NONE  Final   Gram Stain NO WBC  SEEN NO ORGANISMS SEEN   Final   Culture   Final    FEW STAPHYLOCOCCUS AUREUS NO ANAEROBES ISOLATED Performed at [facility], [place] [address], [place], [place] [address]    Report Status 08/01/2019 FINAL  Final   Organism ID, Bacteria STAPHYLOCOCCUS AUREUS  Final      Susceptibility   Staphylococcus aureus - MIC*    CIPROFLOXACIN <=0.5 SENSITIVE Sensitive     ERYTHROMYCIN >=8 RESISTANT Resistant     GENTAMICIN <=0.5 SENSITIVE Sensitive     OXACILLIN <=0.25 SENSITIVE Sensitive     TETRACYCLINE >=16 RESISTANT Resistant     VANCOMYCIN 1 SENSITIVE Sensitive     TRIMETH/SULFA <=10 SENSITIVE Sensitive     CLINDAMYCIN RESISTANT Resistant     RIFAMPIN <=0.5 SENSITIVE Sensitive     Inducible Clindamycin POSITIVE Resistant     * FEW STAPHYLOCOCCUS AUREUS    RADIOLOGY STUDIES/RESULTS: Dg Abd 1 View  Result Date: 07/15/2019 CLINICAL DATA:  Abdominal pain. EXAM: ABDOMEN - 1 VIEW COMPARISON:  07/12/2019 FINDINGS: Nondistended gas-filled loops of small bowel and colon noted. Nondistended gas-filled stomach is present. No suspicious calcifications are present. Stool in the colon is present. IMPRESSION: Nonspecific nonobstructive bowel gas pattern with nondistended gas-filled stomach, small bowel and colon. Electronically Signed   By: Margarette Canada M.D.   On: 07/15/2019 17:02   Dg Abd 1 View  Result Date: 07/12/2019 CLINICAL DATA:  NG tube placement EXAM: ABDOMEN - 1 VIEW COMPARISON:  None. FINDINGS:  Nasogastric tube tip and side port project over the stomach. Lung bases are clear. No dilated bowel is visible. IMPRESSION: NG tube tip and side port project over the stomach. Electronically Signed   By: Ulyses Jarred M.D.   On: 07/12/2019 00:33   Dg Abd 1 View  Result Date: 07/11/2019 CLINICAL DATA:  Nausea and vomiting with abdominal pain EXAM: ABDOMEN - 1 VIEW COMPARISON:  07/06/2019 FINDINGS: Marked gaseous dilatation of the stomach. Otherwise nonobstructed gas pattern with gas in the small and  large bowel. Findings could be secondary to enteritis or mild ileus. IMPRESSION: 1. Incompletely visualized marked gaseous dilatation of stomach, consider NG tube decompression. Outlet obstruction could be considered. 2. Remainder of the gas pattern shows air-filled small and large bowel in a nonobstructed pattern, question enteritis or mild ileus. Electronically Signed   By: Donavan Foil M.D.   On: 07/11/2019 21:58   Ct Angio Chest Pe W Or Wo Contrast  Result Date: 08/01/2019 CLINICAL DATA:  48 year old male with syncope. EXAM: CT ANGIOGRAPHY CHEST WITH CONTRAST TECHNIQUE: Multidetector CT imaging of the chest was performed using the standard protocol during bolus administration of intravenous contrast. Multiplanar CT image reconstructions and MIPs were obtained to evaluate the vascular anatomy. CONTRAST:  11mL OMNIPAQUE IOHEXOL 350 MG/ML SOLN COMPARISON:  None. FINDINGS: Cardiovascular: Borderline cardiomegaly. No pericardial effusion. Multi vessel coronary vascular calcification. The thoracic aorta is unremarkable. Evaluation of the pulmonary arteries is somewhat limited due to suboptimal opacification and visualization of the peripheral branches and respiratory motion artifact. No large or central pulmonary artery embolus identified. Right sided PICC with tip close to the cavoatrial junction noted. Mediastinum/Nodes: There is no hilar or mediastinal adenopathy. The esophagus and the thyroid gland are grossly unremarkable. No mediastinal fluid collection. Lungs/Pleura: Bibasilar linear atelectasis/scarring. No lobar consolidation, pleural effusion pneumothorax. The central airways are patent. Upper Abdomen: Multiple gallstones. The visualized upper abdomen is otherwise unremarkable. Musculoskeletal: No chest wall abnormality. No acute or significant osseous findings. Review of the MIP images confirms the above findings. IMPRESSION: 1. No acute intrathoracic pathology. No CT evidence of central pulmonary  artery embolus. 2. Cholelithiasis. Aortic Atherosclerosis (ICD10-I70.0). Electronically Signed   By: Anner Crete M.D.   On: 08/01/2019 20:55   Mr Lumbar Spine W Wo Contrast  Result Date: 07/06/2019 CLINICAL DATA:  Infection.  Rule out epidural abscess. EXAM: MRI LUMBAR SPINE WITHOUT AND WITH CONTRAST TECHNIQUE: Multiplanar and multiecho pulse sequences of the lumbar spine were obtained without and with intravenous contrast. CONTRAST:  10 mL Gadovist IV COMPARISON:  CT abdomen pelvis in CT lumbar spine 07/06/2019 FINDINGS: Segmentation:  Normal Alignment:  Mild retrolisthesis L1-2 and L4-5 and L5-S1. Vertebrae: Negative for vertebral fracture or mass. No vertebral edema or abnormal enhancement. No evidence of discitis. Conus medullaris and cauda equina: Conus extends to the L1-2 level. Conus and cauda equina appear normal. Paraspinal and other soft tissues: Large enhancing fluid collection in the right paraspinous muscle at the L4 level measures 5.5 x 6 cm compatible with abscess. Adjacent facet joints show mild degeneration but no definite infection. Multilocular large fluid collection in the right iliopsoas muscle compatible with abscess. This measures at least 5 x 6 cm. Large multilocular fluid collection in the right gluteus muscle measures at least 5 cm. Abscess in the iliopsoas and gluteus muscle appear to communicate through the sciatic notch. Disc levels: L1-2: Disc and facet degeneration. Shallow left-sided disc protrusion with mild subarticular stenosis on the left. L2-3: Negative L3-4: Normal disc space. Small  epidural abscess posteriorly on the left. Bilateral facet degeneration without evidence of facet joint infection. L4-5: Mild retrolisthesis. Disc degeneration and facet degeneration. Small extraforaminal disc protrusion on the left. Epidural abscess posteriorly on the left compressing the thecal sac and contributing to moderate spinal stenosis. No evidence of facet joint infection. L5-S1:  Disc degeneration with left-sided disc protrusion. Posterior epidural abscess is ill-defined but is contributing to compression of thecal sac. There is enhancement and fluid along the right S1 nerve root which may be the route of spread to the epidural space. No evidence of facet infection. IMPRESSION: 1. Multiple areas of infection. Large areas of abscess formation in the right paraspinous muscle at the L4 level. Large abscess in the right iliopsoas muscle and also in the right gluteus muscle. 2. 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 This may have entered the spinal canal via the right S1 nerve root. No evidence of discitis or facet joint infection. Electronically Signed   By: Franchot Gallo M.D.   On: 07/06/2019 13:34   Mr Pelvis W Wo Contrast  Result Date: 07/06/2019 CLINICAL DATA:  Right-sided low back pain for several weeks. No recent injury. Recent antibiotic therapy for urinary tract infection. EXAM: MRI PELVIS WITHOUT AND WITH CONTRAST TECHNIQUE: Multiplanar multisequence MR imaging of the pelvis was performed both before and after administration of intravenous contrast. CONTRAST:  10 cc Gadavist COMPARISON:  Abdominopelvic CT 07/06/2019 and 06/18/2019. FINDINGS: Urinary Tract: The visualized distal ureters and bladder appear unremarkable. Bowel: No bowel wall thickening, distention or surrounding inflammation identified within the pelvis. Vascular/Lymphatic: No enlarged pelvic lymph nodes identified. No significant vascular findings. Reproductive: There are areas T2 hyperintensity and decreased enhancement centrally in the prostate gland bilaterally which may be secondary to benign prosthetic hypertrophy or prostatitis. There is no surrounding inflammatory change. The seminal vesicles appear normal. Other: No ascites. Musculoskeletal: As demonstrated on earlier CT scan, there are multiple complex fluid collections surrounding the right sacroiliac joint. These demonstrate internal air  bubbles, heterogeneity and irregular peripheral enhancement. Dominant component laterally in the gluteus musculature measures up to 9.8 x 6.1 cm on image 18/13. There is a component in the right issue femoral fossa measuring up to 5.2 cm on image 27/3. This demonstrates inferior extension around the right common hamstring tendon. There are intrapelvic components extending through the greater sciatic foramen, involving the right piriformis muscle and measuring up to 6.3 cm on image 13/12. There are also irregular components within the right iliopsoas muscle measuring up to 3.8 cm on image 9/13. There are right posterior paraspinal components within the erector spinae musculature, measuring up to 6.2 cm on image 1/13. There is mild T2 hyperintensity and enhancement within the right sacroiliac joint, with associated marrow edema and enhancement in the adjacent right sacral ala. T2 hyperintensity and enhancement are also present within the left proximal common hamstring muscle. There are no focal fluid collections in the left pelvis. No significant hip joint effusion or abnormal synovial enhancement. IMPRESSION: 1. As seen on earlier CT, there are multiple complex fluid collections in the right pelvis with internal air and peripheral enhancement consistent with multifocal abscesses. 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 2. Inflammation extends into the erector spinae musculature on the right. Lumbar spine findings are dictated separately. 3. Mild T2 hyperintensity within the proximal left common hamstring muscle. 4. Central heterogeneity  within the prostate gland is removed from these other findings and probably due to BPH. Prostatitis could have this appearance. Electronically Signed   By: Richardean Sale M.D.   On: 07/06/2019 13:32   Ct Abdomen Pelvis W  Contrast  Addendum Date: 07/06/2019   ADDENDUM REPORT: 07/06/2019 08:20 ADDENDUM: Original report by Dr. Golden Circle. Addendum by Dr. Jeralyn Ruths for contrast extravasation documentation: Type of contrast:  Isovue 300 Site of extravasation: Right AC Estimated volume of extravasation: 40 ml Area of extravasation scanned with CT? no PATIENT'S SIGNS AND SYMPTOMS Skin blistering/ulceration: no Decrease capillary refill: no Change in skin color: no Decreased motor function or severe tightness: no Decreased pulses distal to site of extravasation: no Altered sensation: no Increasing pain or signs of increased swelling during observation: no TREATMENT An ice pack was applied with instructions given to the patient for limb elevation and warning signs to look for. The patient was returned to the emergency department. Electronically Signed   By: Logan Bores M.D.   On: 07/06/2019 08:20   Result Date: 07/06/2019 CLINICAL DATA:  Right-sided pain for several weeks, initial encounter EXAM: CT ABDOMEN AND PELVIS WITH CONTRAST TECHNIQUE: Multidetector CT imaging of the abdomen and pelvis was performed using the standard protocol following bolus administration of intravenous contrast. CONTRAST:  162mL ISOVUE-300 IOPAMIDOL (ISOVUE-300) INJECTION 61% COMPARISON:  06/18/2019 FINDINGS: Lower chest: Lung bases are well aerated bilaterally. There is a vague nodular density identified on the first image within the right middle lobe. This area was not covered on the prior exam. Hepatobiliary: Liver is diffusely decreased in attenuation consistent with fatty infiltration. The gallbladder is decompressed. Scattered calcifications are noted within consistent with stones. No obstructive changes are noted. Pancreas: Unremarkable. No pancreatic ductal dilatation or surrounding inflammatory changes. Spleen: Normal in size without focal abnormality. Adrenals/Urinary Tract: Adrenal glands are within normal limits. The kidneys are well visualized with renal  calculi stable in appearance from the prior exam. The largest of these lies in the lower pole measuring approximately 9 mm. The left kidney demonstrates no renal calculi. Very mild prominence of the collecting system and ureter on the right are seen similar to that noted on the prior exam without obstructing lesion. The bladder is well distended. Stomach/Bowel: No obstructive or inflammatory changes of the colon are seen. The appendix is not well visualized. No obstructive or inflammatory changes of the small bowel are seen. The stomach is unremarkable. Vascular/Lymphatic: No significant vascular findings are present. No enlarged abdominal or pelvic lymph nodes. Reproductive: Prostate is unremarkable. Other: No abdominal wall hernia or abnormality. No abdominopelvic ascites. Musculoskeletal: There are changes within the right buttock, right iliac fossa and perispinal musculature on the right with fluid collections and air foci identified consistent with localized infection. The fluid collection within the right paraspinal musculature measures 5.6 x 5.5 cm consistent with a focal abscess. Similar extension into the iliac fossa on the right as well as the musculature of the right buttocks is seen. Soft tissue density in the presacral region and along the right pelvic wall are noted consistent with focal localized infection as well. IMPRESSION: Changes consistent with localized abscess involving the right iliac fossa, musculature of the right buttocks and right paraspinal musculature with extension into the lateral aspect of the pelvis on the right. Percutaneous drainage of the paraspinal collection may be helpful. These changes are all new from the prior exam. Stable renal calculi on the right. Stable cholelithiasis and fatty infiltration of the liver. Electronically Signed: By: Elta Guadeloupe  Lukens M.D. On: 07/06/2019 07:55   Ct Hip Right Wo Contrast  Result Date: 07/30/2019 CLINICAL DATA:  Right hip abscess. EXAM: CT OF  THE RIGHT HIP WITHOUT CONTRAST TECHNIQUE: Multidetector CT imaging of the right hip was performed according to the standard protocol. Multiplanar CT image reconstructions were also generated. COMPARISON:  None. FINDINGS: Bones/Joint/Cartilage Widening of the right sacroiliac joint with erosive changes most concerning for septic arthritis. Nondisplaced fracture of the inferior aspect of the right sacrum adjacent to the sacroiliac joint. Subchondral fracture involving the right ilium along the right SI joint. Small amount air along the anterior margin of the right sacroiliac joint within the iliopsoas muscle. No acute fracture or dislocation.  Normal alignment. Ligaments Ligaments are suboptimally evaluated by CT. Soft tissue, Muscles and Tendons Soft tissue wound overlying the right greater trochanter with air within the subcutaneous fat. There has been significant interval improvement in the complex fluid collection involving the right gluteal musculature extending into the pelvis. Small amount air in the left semimembranosus muscle partially visualized. No fluid collection or hematoma.  No soft tissue mass. IMPRESSION: 1. 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 Interval development of a subchondral fracture involving the right ilium and a nondisplaced fracture along the inferior aspect of the right sacrum. 2. Soft tissue wound overlying the right greater trochanter with air within the subcutaneous fat likely postsurgical. Significant interval improvement in the complex fluid collection involving the right gluteal musculature extending into the pelvis. 3. Small amount air in the left semimembranosus muscle partially visualized most concerning for persistent infection in the absence recent instrumentation involving this area. Electronically Signed   By: Kathreen Devoid   On: 07/30/2019 16:56   Mr Tibia Fibula Right W Wo Contrast  Result Date: 07/26/2019 CLINICAL DATA:  drainage posteromedial R  knee, eval for abscess. Contacted by nurse regarding new drainage from posteromedial proximal lower R leg. Started this am Pt notes pain only when touching areaNo other concerns elsewhere Currently on ancef for MSSA from R gluteal/hip abscess EXAM: MRI OF LOWER RIGHT EXTREMITY WITHOUT AND WITH CONTRAST TECHNIQUE: Multiplanar, multisequence MR imaging of the right lower extremity was performed both before and after administration of intravenous contrast. CONTRAST:  64mL GADAVIST GADOBUTROL 1 MMOL/ML IV SOLN COMPARISON:  None. FINDINGS: Soft tissues: Large complex fluid collection extends from the thigh across the posterior knee. It begins above the included field of view and thus is entire extent is not imaged on this exam. The collection terminates in the posterosuperior calf, below the knee. There is peripheral wall enhancement and septal enhancement throughout the collection. Small foci of signal void are noted within the fluid collection consistent with bubbles of air. Transversely, the collection measures a maximum of 11 x 9 cm. The collection is at least 27 cm from superior to inferior, but extends above the included field of view and therefore is longer from superior to inferior. The collection is centered on the hamstrings. The neurovascular structures are displaced anteriorly. There is diffuse surrounding soft tissue edema/cellulitis. Osseous structures: Normal signal. No evidence of edema or osteomyelitis. Right knee joint is normally spaced and aligned. No joint effusion. Visualized tendons are intact, normal in signal. IMPRESSION: 1. Large complex collection consistent with an abscess extending throughout the entire visible posterior thigh, centered on the posterior compartment involving the hamstring musculature. The collection measures 11 x 9 cm transversely. It extends through the visualized thigh across the knee to the upper leg. There is surrounding soft tissue edema/cellulitis.  2. No evidence of  osteomyelitis. Knee joint is unremarkable with no effusion to suggest septic arthritis. Electronically Signed   By: Lajean Manes M.D.   On: 07/26/2019 21:17   Ct Aspiration  Result Date: 07/06/2019 INDICATION: 48 year old with multiple sites of infection including a lumbar paraspinal muscular abscess. Plan for CT-guided aspiration of the paraspinal abscess collection. EXAM: CT-GUIDED ASPIRATION OF PARASPINAL ABSCESS MEDICATIONS: Moderate sedation ANESTHESIA/SEDATION: Fentanyl 100 mcg IV; Versed 2.0 mg IV Moderate Sedation Time:  10 minutes The patient was continuously monitored during the procedure by the interventional radiology nurse under my direct supervision. COMPLICATIONS: None immediate. PROCEDURE: Informed written consent was obtained from the patient after a thorough discussion of the procedural risks, benefits and alternatives. All questions were addressed. A timeout was performed prior to the initiation of the procedure. Patient was placed prone on the CT scanner. CT images through the lower abdomen were obtained. The right paraspinal muscular abscess was identified at the L4-L5 level. Overlying skin was prepped with chlorhexidine. Sterile field was created. Skin was anesthetized with 1% lidocaine. Yueh catheter was directed into the abscess collection with CT guidance. Approximately 15 mL of brown purulent fluid was aspirated. No additional fluid could be aspirated. At this point in the procedure the patient was in severe pain from lying prone and the procedure was stopped. Bandage placed over the puncture site. FINDINGS: Low-density abscess in the right paraspinal musculature at L4-L5. 15 mL of brown purulent fluid was removed from this collection. IMPRESSION: CT-guided aspiration of the right paraspinal abscess at L4-L5. 15 mL of purulent fluid was removed and sent for culture. Electronically Signed   By: Markus Daft M.D.   On: 07/06/2019 19:22   Ct L-spine No Charge  Result Date:  07/06/2019 CLINICAL DATA:  Right-sided back pain and right lower extremity pain for 3 weeks. EXAM: CT LUMBAR SPINE WITHOUT CONTRAST TECHNIQUE: Multidetector CT imaging of the lumbar spine was performed without intravenous contrast administration. Multiplanar CT image reconstructions were also generated. COMPARISON:  None. FINDINGS: Segmentation: 5 lumbar type vertebrae. Alignment: Normal. Vertebrae: No acute fracture or focal pathologic process. Paraspinal and other soft tissues: There is gas in the paraspinal soft tissues with a 5.6 cm fluid collection in the right multifidus muscle at the L4-5 level. There is gas in the right iliacus and psoas muscles and right gluteus minimus and medius muscles with expansion of those muscles with low-density consistent with abscess. There is also fluid and abnormal soft tissue in the presacral space extending through the right sciatic notch. There is a tiny bubble of air in the right hip joint. Cholelithiasis. 5 mm stone in the mid right kidney. Small right inguinal hernia containing only fat. Disc levels: T7-8 through L3-4: No appreciable disc protrusions. The discs at T9-10 and T11-12 are degenerated with vacuum phenomena. L4-5: Small broad-based disc bulge. Slight hypertrophy of the left ligamentum flavum creates narrowing of the spinal canal. The CT thecal sac appears compressed in the transverse dimension. L5-S1: Disc space narrowing asymmetric disc osteophyte complex to the left of midline. There appears to be abnormal soft tissue in the spinal canal at S1. Given the adjacent soft tissue infection, the possibility of infection in the spinal canal should be considered. MRI of the lower abdomen and pelvis and lumbar spine recommended for further evaluation of the findings to determine whether there is epidural extension of infection. IMPRESSION: 1. Extensive soft tissue infection in bulging the right paraspinal muscles, right gluteal muscles, presacral space, and the soft  tissues of the right sciatic notch. There is a definable abscess in the right multifidus muscle. 2. Subtle increased soft tissue in the spinal canal at S1. Asymmetric compression of the thecal sac at L4-5. The possibility of infection within the spinal canal should be considered. 3. MRI of the lumbar spine with and without contrast and MRI of the lower abdomen and pelvis with and without contrast recommended for further evaluation. Critical Value/emergent results were called by telephone at the time of interpretation on 07/06/2019 at 8:17 am to the attending ER physician , who verbally acknowledged these results. Electronically Signed   By: Lorriane Shire M.D.   On: 07/06/2019 08:16   Dg Chest Portable 1 View  Result Date: 07/06/2019 CLINICAL DATA:  Shortness of breath. EXAM: PORTABLE CHEST 1 VIEW COMPARISON:  None. FINDINGS: Mild cardiomegaly. Normal mediastinal contours. No focal airspace disease, pleural effusion, or pneumothorax. No pulmonary edema. No acute osseous abnormalities. IMPRESSION: Mild cardiomegaly. No acute chest findings. Electronically Signed   By: Keith Rake M.D.   On: 07/06/2019 04:10   Vas [place] Lower Extremity Venous (dvt)  Result Date: 08/01/2019  Lower Venous Study Indications: Follow up dvt.  Limitations: Bandages and open wound. Comparison Study: previous study done 9/25 Performing Technologist: Abram Sander RVS  Examination Guidelines: A complete evaluation includes B-mode imaging, spectral Doppler, color Doppler, and power Doppler as needed of all accessible portions of each vessel. Bilateral testing is considered an integral part of a complete examination. Limited examinations for reoccurring indications may be performed as noted.  +---------+---------------+---------+-----------+----------+-----------------+  RIGHT     Compressibility Phasicity Spontaneity Properties Thrombus Aging     +---------+---------------+---------+-----------+----------+-----------------+  CFV        Full            Yes       Yes                                       +---------+---------------+---------+-----------+----------+-----------------+  SFJ       Full                                                                +---------+---------------+---------+-----------+----------+-----------------+  FV Prox   Full                                                                +---------+---------------+---------+-----------+----------+-----------------+  FV Mid    Full                                                                +---------+---------------+---------+-----------+----------+-----------------+  FV Distal Full                                                                +---------+---------------+---------+-----------+----------+-----------------+  POP                                                        Not visualized     +---------+---------------+---------+-----------+----------+-----------------+  PTV       None                                             Age Indeterminate  +---------+---------------+---------+-----------+----------+-----------------+  PERO                                                       Not visualized     +---------+---------------+---------+-----------+----------+-----------------+   +----+---------------+---------+-----------+----------+--------------+  LEFT Compressibility Phasicity Spontaneity Properties Thrombus Aging  +----+---------------+---------+-----------+----------+--------------+  CFV  Full            Yes       Yes                                    +----+---------------+---------+-----------+----------+--------------+     Summary: Right: Findings consistent with age indeterminate deep vein thrombosis involving the right posterior tibial veins. No cystic structure found in the popliteal fossa. Left: No evidence of common femoral vein obstruction.  *See table(s) above for measurements and observations. Electronically signed by Harold Barban MD on  08/01/2019 at 4:08:15 PM.    Final    Vas [place] Lower Extremity Venous (dvt)  Result Date: 07/28/2019  Lower Venous Study Indications: Edema, abcess right proximal calf, and Pain.  Comparison Study: 07/11/19 RLE negative Performing Technologist: June Leap RDMS, RVT  Examination Guidelines: A complete evaluation includes B-mode imaging, spectral Doppler, color Doppler, and power Doppler as needed of all accessible portions of each vessel. Bilateral testing is considered an integral part of a complete examination. Limited examinations for reoccurring indications may be performed as noted.  +---------+---------------+---------+-----------+----------+--------------+  RIGHT     Compressibility Phasicity Spontaneity Properties Thrombus Aging  +---------+---------------+---------+-----------+----------+--------------+  CFV       Full            Yes       Yes                                    +---------+---------------+---------+-----------+----------+--------------+  SFJ       Full                                                             +---------+---------------+---------+-----------+----------+--------------+  FV Prox   Full                                                             +---------+---------------+---------+-----------+----------+--------------+  FV Mid    Full                                                             +---------+---------------+---------+-----------+----------+--------------+  FV Distal Full                                                             +---------+---------------+---------+-----------+----------+--------------+  PFV       Full                                                             +---------+---------------+---------+-----------+----------+--------------+  POP                                                        Not visualized  +---------+---------------+---------+-----------+----------+--------------+  PTV       None                                              Acute           +---------+---------------+---------+-----------+----------+--------------+  PERO      Full                                                             +---------+---------------+---------+-----------+----------+--------------+   +----+---------------+---------+-----------+----------+--------------+  LEFT Compressibility Phasicity Spontaneity Properties                 +----+---------------+---------+-----------+----------+--------------+  CFV                                                   Not visualized  +----+---------------+---------+-----------+----------+--------------+     Summary: Right: Findings consistent with acute deep vein thrombosis involving the right posterior tibial veins.  *See table(s) above for measurements and observations. Electronically signed by Curt Jews MD on 07/28/2019 at 4:04:40 AM.    Final    Vas [place] Lower Extremity Venous (dvt)  Result Date: 07/09/2019  Lower Venous Study Indications: Pain.  Risk Factors: Abscess of spinal canal, right gluteal muscle and iliopsoas muscle. Comparison Study: No prior study on file Performing Technologist: Sharion Dove RVS  Examination Guidelines: A complete evaluation includes B-mode imaging, spectral Doppler, color Doppler, and power Doppler as needed of all accessible portions of each vessel. Bilateral testing is considered an integral part of a  complete examination. Limited examinations for reoccurring indications may be performed as noted.  +---------+---------------+---------+-----------+----------+--------------+  RIGHT     Compressibility Phasicity Spontaneity Properties Thrombus Aging  +---------+---------------+---------+-----------+----------+--------------+  CFV       Full            Yes       Yes                                    +---------+---------------+---------+-----------+----------+--------------+  SFJ       Full                                                              +---------+---------------+---------+-----------+----------+--------------+  FV Prox   Full                                                             +---------+---------------+---------+-----------+----------+--------------+  FV Mid    Full                                                             +---------+---------------+---------+-----------+----------+--------------+  FV Distal Full                                                             +---------+---------------+---------+-----------+----------+--------------+  PFV       Full                                                             +---------+---------------+---------+-----------+----------+--------------+  POP       Full            Yes       Yes                                    +---------+---------------+---------+-----------+----------+--------------+  PTV       Full                                                             +---------+---------------+---------+-----------+----------+--------------+  PERO      Full                                                             +---------+---------------+---------+-----------+----------+--------------+   +----+---------------+---------+-----------+----------+--------------+  LEFT Compressibility Phasicity Spontaneity Properties Thrombus Aging  +----+---------------+---------+-----------+----------+--------------+  CFV  Full            Yes       Yes                                    +----+---------------+---------+-----------+----------+--------------+     Summary: Right: There is no evidence of deep vein thrombosis in the lower extremity. Left: No evidence of common femoral vein obstruction.  *See table(s) above for measurements and observations. Electronically signed by Harold Barban MD on 07/09/2019 at 1:13:14 AM.    Final    [place] Ekg Site Rite  Result Date: 07/14/2019 If Site Rite image not attached, placement could not be confirmed due to current cardiac rhythm.    LOS: 29 days    Oren Binet, MD  [facility]  If 7PM-7AM, please contact night-coverage  Please page via [email or web address]  Go to [email or web address] and use [facility] universal password to access. If you do not have the password, please contact the hospital operator.  Locate the [facility] provider you are looking for under [facility] and page to a number that you can be directly reached. If you still have difficulty reaching the provider, please page the [facility] (Director on Call) for the Hospitalists listed on amion for assistance.  08/04/2019, 11:33 AM

## 2019-08-05 LAB — CBC
HCT: 25.1 % — ABNORMAL LOW (ref 39.0–52.0)
Hemoglobin: 7.5 g/dL — ABNORMAL LOW (ref 13.0–17.0)
MCH: 29.8 pg (ref 26.0–34.0)
MCHC: 29.9 g/dL — ABNORMAL LOW (ref 30.0–36.0)
MCV: 99.6 fL (ref 80.0–100.0)
Platelets: 270 10*3/uL (ref 150–400)
RBC: 2.52 MIL/uL — ABNORMAL LOW (ref 4.22–5.81)
RDW: 15.8 % — ABNORMAL HIGH (ref 11.5–15.5)
WBC: 5.1 10*3/uL (ref 4.0–10.5)
nRBC: 0.4 % — ABNORMAL HIGH (ref 0.0–0.2)

## 2019-08-05 LAB — GLUCOSE, CAPILLARY
Glucose-Capillary: 156 mg/dL — ABNORMAL HIGH (ref 70–99)
Glucose-Capillary: 153 mg/dL — ABNORMAL HIGH (ref 70–99)
Glucose-Capillary: 144 mg/dL — ABNORMAL HIGH (ref 70–99)
Glucose-Capillary: 127 mg/dL — ABNORMAL HIGH (ref 70–99)

## 2019-08-05 NOTE — Progress Notes (Signed)
Orthopedic Progress Note: The patient has continued to have significant output from his surgical VAC.  I discussed the case with his primary orthopedic team.  They recommend that the patient remain inpatient and they will plan to evaluate his surgical site and VAC output again tomorrow morning.  Prudencio Burly III, PA-C 08/05/2019 3:22 PM

## 2019-08-05 NOTE — Plan of Care (Signed)

## 2019-08-05 NOTE — Progress Notes (Signed)
PROGRESS NOTE        PATIENT DETAILS Name: Bradley Cox Age: 48 y.o. Sex: male Date of Birth: February 18, 1971 Admit Date: 07/05/2019 Admitting Physician Norval Morton, MD [id number], Apolonio Schneiders, NP  Brief Narrative: Patient is a 48 y.o. 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  Has undergone serial debridement-evaluated by ID-with recommendations to continue with Ancef-stop date of 10/26.  Subjective: Pain in the operative site is stable-had 600 cc of output from the [facility]  Assessment/Plan: Sepsis secondary to disseminated MSSA infection including bacteremia, L3-S1 paraspinal abscess, right psoas abscess right posterior thigh abscess: Sepsis pathophysiology has resolved-clinically improved-patient has undergone serial debridement of the right thigh abscess by orthopedics.  Patient is s/p CT-guided drainage of the paraspinal abscess by IR.  ID recommending to continue Ancef with stop date of 10/26.  Patient continues to have significant output from the [facility] site-I am not sure if it is safe for patient to be discharged with this much of output-awaiting recommendations from orthopedic team.  Anemia: Multifactorial etiology-some amount of blood loss from repeated I&D's but suspect mostly from anemia related to critical illness.  Hemoglobin stable-continue with Xarelto.  Continue to follow hemoglobin closely.  Patient is s/p PRBC transfusion on 9/27 and 9/29.   DVT right lower extremity: Probably provoked-given hospitalization/repeated surgeries.  Given limited ambulation-we will be at risk for further clots and propagation of this DVT.  Difficult situation-has severe anemia requiring 2 units of PRBC transfusion-however after speaking with vascular surgery (Dr.  Trula Slade on 9/30) and orthopedics (Dr. Doreatha Martin) we elected to start anticoagulation with IV heparin-patient was monitored for a few days-and then subsequently transitioned to [place] on 10/3.    DKA: Resolved  Insulin-dependent DM-2: CBGs currently stable-continue NPH 28 units twice daily, 8 units of NovoLog with meals and SSI  HTN: BP stable-continue hydralazine.  Asthma: Stable without any evidence of bronchospasm.  Obesity: Estimated body mass index is 41.78 kg/m as calculated from the following:   Height as of this encounter: 6\' 6"  (1.981 m).   Weight as of this encounter: 164 kg.   Diet: Diet Order            Diet Carb Modified Fluid consistency: Thin; Room service appropriate? Yes  Diet effective now               DVT Prophylaxis: IV heparin.  Code Status: Full code   Family Communication: None at bedside  Disposition Plan: Remain inpatient-Per CIR-not a candidate for inpatient rehab-we will plan on going home with home health services.  Main barrier for discharge appears to be significant output from [facility] site-more than 600 cc in the past 24 hours.  Antimicrobial agents: Anti-infectives (From admission, onward)   Start     Dose/Rate Route Frequency Ordered Stop   08/01/19 1600  amoxicillin (AMOXIL) capsule 500 mg     500 mg Oral  Once 08/01/19 1509 08/01/19 1607   08/01/19 1315  penicillin G in sodium chloride 10,000 units/mL syringe for skin test - 1st bleb     1,000 Units Intradermal  Once 08/01/19 1042 08/01/19 1535   08/01/19 1315  penicillin G in sodium chloride 10,000 units/mL syringe for skin test -  2nd bleb     1,000 Units Intradermal  Once 08/01/19 1042 08/01/19 1537   08/01/19 1300  penicillin G in sodium chloride 10,000 units/mL syringe for skin test     1,000 Units Topical  Once 08/01/19 1042 08/01/19 1535   07/17/19 1857  gentamicin (GARAMYCIN) injection  Status:  Discontinued       As needed 07/17/19 1857 07/17/19 2009   07/17/19 1857  vancomycin  (VANCOCIN) powder  Status:  Discontinued       As needed 07/17/19 1858 07/17/19 2009   07/10/19 1600  metroNIDAZOLE (FLAGYL) IVPB 500 mg  Status:  Discontinued     500 mg 100 mL/hr over 60 Minutes Intravenous Every 8 hours 07/10/19 1549 07/13/19 0939   07/09/19 0839  vancomycin (VANCOCIN) powder  Status:  Discontinued       As needed 07/09/19 0839 07/09/19 0935   07/09/19 0839  tobramycin (NEBCIN) powder  Status:  Discontinued       As needed 07/09/19 0840 07/09/19 0935   07/07/19 1830  ceFAZolin (ANCEF) IVPB 2g/100 mL premix     2 g 200 mL/hr over 30 Minutes Intravenous Every 8 hours 07/07/19 1333     07/07/19 0900  ceFAZolin (ANCEF) IVPB 2g/100 mL premix  Status:  Discontinued     2 g 200 mL/hr over 30 Minutes Intravenous Every 8 hours 07/07/19 0847 07/07/19 1333   07/06/19 2030  clindamycin (CLEOCIN) IVPB 600 mg  Status:  Discontinued     600 mg 100 mL/hr over 30 Minutes Intravenous Every 8 hours 07/06/19 2010 07/07/19 0847   07/06/19 1800  vancomycin (VANCOCIN) 1,500 mg in sodium chloride 0.9 % 500 mL IVPB  Status:  Discontinued     1,500 mg 250 mL/hr over 120 Minutes Intravenous Every 12 hours 07/06/19 0820 07/07/19 0847   07/06/19 1400  ceFEPIme (MAXIPIME) 2 g in sodium chloride 0.9 % 100 mL IVPB  Status:  Discontinued     2 g 200 mL/hr over 30 Minutes Intravenous Every 8 hours 07/06/19 0504 07/07/19 0847   07/06/19 1400  vancomycin (VANCOCIN) 2,000 mg in sodium chloride 0.9 % 500 mL IVPB  Status:  Discontinued     2,000 mg 250 mL/hr over 120 Minutes Intravenous Every 8 hours 07/06/19 0504 07/06/19 0818   07/06/19 0745  clindamycin (CLEOCIN) IVPB 600 mg  Status:  Discontinued     600 mg 100 mL/hr over 30 Minutes Intravenous  Once 07/06/19 0737 07/06/19 2029   07/06/19 0500  ceFEPIme (MAXIPIME) 2 g in sodium chloride 0.9 % 100 mL IVPB     2 g 200 mL/hr over 30 Minutes Intravenous  Once 07/06/19 0451 07/06/19 1335   07/06/19 0500  vancomycin (VANCOCIN) 2,500 mg in sodium chloride  0.9 % 500 mL IVPB     2,500 mg 250 mL/hr over 120 Minutes Intravenous  Once 07/06/19 0451 07/06/19 1335      Procedures: 9/4>> CT-guided aspiration of right paraspinal abscess at L4-L5 07/09/19>>  incision and drainage of the right hip/pelvic abscess along with neuroplasty of the right sciatic nerve by neurosurgery 9/9>>TEE 07/17/2019>>I&D of right gluteal abscess with drainage of 300 cc purulent fluid.   07/20/19>> Repeat irrigation and debridement right hip and application of wound VAC  07/27/19>> incision and drainage of posterior right thigh abscess -wound VAC placement 9/28>> repeat irrigation and debridement of right thigh abscess, incisional wound VAC placement.   CONSULTS:  ID, orthopedic surgery and Neurosurgery  Time spent: 25- minutes-Greater than 50% of this  time was spent in counseling, explanation of diagnosis, planning of further management, and coordination of care.  MEDICATIONS: Scheduled Meds:  sodium chloride   Intravenous Once   sodium chloride   Intravenous Once   acetaminophen  1,000 mg Oral TID   Chlorhexidine Gluconate Cloth  6 each Topical Daily   gabapentin  300 mg Oral TID   hydrALAZINE  25 mg Oral BID   influenza vac split quadrivalent PF  0.5 mL Intramuscular Tomorrow-1000   insulin aspart  0-15 Units Subcutaneous TID WC   insulin aspart  0-5 Units Subcutaneous QHS   insulin aspart  8 Units Subcutaneous TID WC   insulin NPH Human  28 Units Subcutaneous BID AC & HS   iron polysaccharides  150 mg Oral Daily   methocarbamol  750 mg Oral TID   metoCLOPramide  10 mg Oral TID AC & HS   polyethylene glycol  17 g Oral BID   [START ON 08/25/2019] rivaroxaban  20 mg Oral Q supper   Followed by   rivaroxaban  15 mg Oral BID WC   senna-docusate  1 tablet Oral BID   sodium chloride flush  10-40 mL Intracatheter Q12H   Continuous Infusions:   ceFAZolin (ANCEF) IV 2 g (08/05/19 1051)   PRN Meds:.albuterol, diphenhydrAMINE, hydrALAZINE,  HYDROmorphone, ondansetron **OR** ondansetron (ZOFRAN) IV, oxyCODONE, sodium chloride flush, zolpidem   PHYSICAL EXAM: Vital signs: Vitals:   08/04/19 0431 08/04/19 1435 08/04/19 2037 08/05/19 0509  BP: 127/74 127/70 113/65 110/72  Pulse: 97 91 95 95  Resp: 18 18 18 18   Temp: 98.5 F (36.9 C) 98.6 F (37 C) 98 F (36.7 C) 98.1 F (36.7 C)  TempSrc: Oral Oral Oral Oral  SpO2: 99% 97% 97% 96%  Weight:      Height:       Filed Weights   07/05/19 1807 07/06/19 2116 07/27/19 1113  Weight: (!) 167.4 kg (!) 164 kg (!) 164 kg   Body mass index is 41.78 kg/m.   Gen Exam:Alert awake-not in any distress HEENT:atraumatic, normocephalic Chest: B/L clear to auscultation anteriorly CVS:S1S2 regular Abdomen:soft non tender, non distended Extremities:no edema Neurology: Non focal Skin: no rash  I have personally reviewed following labs and imaging studies  LABORATORY DATA: CBC: Recent Labs  Lab 07/30/19 0550 07/31/19 0745  08/01/19 0547 08/02/19 0430 08/03/19 0711 08/04/19 0813 08/05/19 0337  WBC 6.5 5.5  --  9.5 6.1 6.0 5.4 5.1  NEUTROABS 4.4 3.4  --  7.0  --   --   --   --   HGB 7.4* 6.9*   < > 7.7* 7.6* 8.0* 7.4* 7.5*  HCT 23.2* 21.5*   < > 23.9* 24.7* 25.8* 24.4* 25.1*  MCV 94.3 96.0  --  96.8 99.2 95.9 97.6 99.6  PLT 299 299  --  302 279 294 283 270   < > = values in this interval not displayed.    Basic Metabolic Panel: Recent Labs  Lab 07/30/19 0550 07/31/19 0745 08/01/19 0547 08/02/19 0430  NA 133* 134* 133* 135  K 3.6 3.8 3.5 3.4*  CL 93* 94* 95* 98  CO2 31 29 29 28   GLUCOSE 133* 183* 92 136*  BUN <5* <5* 5* <5*  CREATININE 0.51* 0.55* 0.61 0.65  CALCIUM 8.5* 8.6* 8.1* 8.3*    GFR: Estimated Creatinine Clearance: 188.1 mL/min (by C-G formula based on SCr of 0.65 mg/dL).  Liver Function Tests: No results for input(s): AST, ALT, ALKPHOS, BILITOT, PROT, ALBUMIN in the last  168 hours. No results for input(s): LIPASE, AMYLASE in the last 168  hours. No results for input(s): AMMONIA in the last 168 hours.  Coagulation Profile: No results for input(s): INR, PROTIME in the last 168 hours.  Cardiac Enzymes: No results for input(s): CKTOTAL, CKMB, CKMBINDEX, TROPONINI in the last 168 hours.  BNP (last 3 results) No results for input(s): PROBNP in the last 8760 hours.  HbA1C: No results for input(s): HGBA1C in the last 72 hours.  CBG: Recent Labs  Lab 08/04/19 0812 08/04/19 1220 08/04/19 1644 08/04/19 2058 08/05/19 0818  GLUCAP 147* 92 108* 130* 156*    Lipid Profile: No results for input(s): CHOL, HDL, LDLCALC, TRIG, CHOLHDL, LDLDIRECT in the last 72 hours.  Thyroid Function Tests: No results for input(s): TSH, T4TOTAL, FREET4, T3FREE, THYROIDAB in the last 72 hours.  Anemia Panel: No results for input(s): VITAMINB12, FOLATE, FERRITIN, TIBC, IRON, RETICCTPCT in the last 72 hours.  Urine analysis:    Component Value Date/Time   COLORURINE YELLOW 07/06/2019 [place] 07/06/2019 0437   LABSPEC 1.024 07/06/2019 0437   PHURINE 5.0 07/06/2019 0437   GLUCOSEU >=500 (A) 07/06/2019 0437   HGBUR MODERATE (A) 07/06/2019 0437   BILIRUBINUR NEGATIVE 07/06/2019 0437   KETONESUR 80 (A) 07/06/2019 0437   PROTEINUR NEGATIVE 07/06/2019 0437   NITRITE NEGATIVE 07/06/2019 0437   LEUKOCYTESUR NEGATIVE 07/06/2019 0437    Sepsis Labs: Lactic Acid, Venous    Component Value Date/Time   LATICACIDVEN 2.6 ([place]) 07/06/2019 2213    MICROBIOLOGY: Recent Results (from the past 240 hour(s))  Aerobic/Anaerobic Culture (surgical/deep wound)     Status: None   Collection Time: 07/27/19 12:56 PM   Specimen: PATH Other; Tissue  Result Value Ref Range Status   Specimen Description ABSCESS RIGHT THIGH  Final   Special Requests NONE  Final   Gram Stain NO WBC SEEN NO ORGANISMS SEEN   Final   Culture   Final    FEW STAPHYLOCOCCUS AUREUS NO ANAEROBES ISOLATED Performed at [facility], [place] [address]  [place], [place] [address]    Report Status 08/01/2019 FINAL  Final   Organism ID, Bacteria STAPHYLOCOCCUS AUREUS  Final      Susceptibility   Staphylococcus aureus - MIC*    CIPROFLOXACIN <=0.5 SENSITIVE Sensitive     ERYTHROMYCIN >=8 RESISTANT Resistant     GENTAMICIN <=0.5 SENSITIVE Sensitive     OXACILLIN <=0.25 SENSITIVE Sensitive     TETRACYCLINE >=16 RESISTANT Resistant     VANCOMYCIN 1 SENSITIVE Sensitive     TRIMETH/SULFA <=10 SENSITIVE Sensitive     CLINDAMYCIN RESISTANT Resistant     RIFAMPIN <=0.5 SENSITIVE Sensitive     Inducible Clindamycin POSITIVE Resistant     * FEW STAPHYLOCOCCUS AUREUS    RADIOLOGY STUDIES/RESULTS: Dg Abd 1 View  Result Date: 07/15/2019 CLINICAL DATA:  Abdominal pain. EXAM: ABDOMEN - 1 VIEW COMPARISON:  07/12/2019 FINDINGS: Nondistended gas-filled loops of small bowel and colon noted. Nondistended gas-filled stomach is present. No suspicious calcifications are present. Stool in the colon is present. IMPRESSION: Nonspecific nonobstructive bowel gas pattern with nondistended gas-filled stomach, small bowel and colon. Electronically Signed   By: Margarette Canada M.D.   On: 07/15/2019 17:02   Dg Abd 1 View  Result Date: 07/12/2019 CLINICAL DATA:  NG tube placement EXAM: ABDOMEN - 1 VIEW COMPARISON:  None. FINDINGS: Nasogastric tube tip and side port project over the stomach. Lung bases are clear. No dilated bowel is visible. IMPRESSION: NG  tube tip and side port project over the stomach. Electronically Signed   By: Ulyses Jarred M.D.   On: 07/12/2019 00:33   Dg Abd 1 View  Result Date: 07/11/2019 CLINICAL DATA:  Nausea and vomiting with abdominal pain EXAM: ABDOMEN - 1 VIEW COMPARISON:  07/06/2019 FINDINGS: Marked gaseous dilatation of the stomach. Otherwise nonobstructed gas pattern with gas in the small and large bowel. Findings could be secondary to enteritis or mild ileus. IMPRESSION: 1. Incompletely visualized marked gaseous dilatation of stomach, consider NG  tube decompression. Outlet obstruction could be considered. 2. Remainder of the gas pattern shows air-filled small and large bowel in a nonobstructed pattern, question enteritis or mild ileus. Electronically Signed   By: Donavan Foil M.D.   On: 07/11/2019 21:58   Ct Angio Chest Pe W Or Wo Contrast  Result Date: 08/01/2019 CLINICAL DATA:  48 year old male with syncope. EXAM: CT ANGIOGRAPHY CHEST WITH CONTRAST TECHNIQUE: Multidetector CT imaging of the chest was performed using the standard protocol during bolus administration of intravenous contrast. Multiplanar CT image reconstructions and MIPs were obtained to evaluate the vascular anatomy. CONTRAST:  170mL OMNIPAQUE IOHEXOL 350 MG/ML SOLN COMPARISON:  None. FINDINGS: Cardiovascular: Borderline cardiomegaly. No pericardial effusion. Multi vessel coronary vascular calcification. The thoracic aorta is unremarkable. Evaluation of the pulmonary arteries is somewhat limited due to suboptimal opacification and visualization of the peripheral branches and respiratory motion artifact. No large or central pulmonary artery embolus identified. Right sided PICC with tip close to the cavoatrial junction noted. Mediastinum/Nodes: There is no hilar or mediastinal adenopathy. The esophagus and the thyroid gland are grossly unremarkable. No mediastinal fluid collection. Lungs/Pleura: Bibasilar linear atelectasis/scarring. No lobar consolidation, pleural effusion pneumothorax. The central airways are patent. Upper Abdomen: Multiple gallstones. The visualized upper abdomen is otherwise unremarkable. Musculoskeletal: No chest wall abnormality. No acute or significant osseous findings. Review of the MIP images confirms the above findings. IMPRESSION: 1. No acute intrathoracic pathology. No CT evidence of central pulmonary artery embolus. 2. Cholelithiasis. Aortic Atherosclerosis (ICD10-I70.0). Electronically Signed   By: Anner Crete M.D.   On: 08/01/2019 20:55   Mr Lumbar  Spine W Wo Contrast  Result Date: 07/06/2019 CLINICAL DATA:  Infection.  Rule out epidural abscess. EXAM: MRI LUMBAR SPINE WITHOUT AND WITH CONTRAST TECHNIQUE: Multiplanar and multiecho pulse sequences of the lumbar spine were obtained without and with intravenous contrast. CONTRAST:  10 mL Gadovist IV COMPARISON:  CT abdomen pelvis in CT lumbar spine 07/06/2019 FINDINGS: Segmentation:  Normal Alignment:  Mild retrolisthesis L1-2 and L4-5 and L5-S1. Vertebrae: Negative for vertebral fracture or mass. No vertebral edema or abnormal enhancement. No evidence of discitis. Conus medullaris and cauda equina: Conus extends to the L1-2 level. Conus and cauda equina appear normal. Paraspinal and other soft tissues: Large enhancing fluid collection in the right paraspinous muscle at the L4 level measures 5.5 x 6 cm compatible with abscess. Adjacent facet joints show mild degeneration but no definite infection. Multilocular large fluid collection in the right iliopsoas muscle compatible with abscess. This measures at least 5 x 6 cm. Large multilocular fluid collection in the right gluteus muscle measures at least 5 cm. Abscess in the iliopsoas and gluteus muscle appear to communicate through the sciatic notch. Disc levels: L1-2: Disc and facet degeneration. Shallow left-sided disc protrusion with mild subarticular stenosis on the left. L2-3: Negative L3-4: Normal disc space. Small epidural abscess posteriorly on the left. Bilateral facet degeneration without evidence of facet joint infection. L4-5: Mild retrolisthesis. Disc degeneration and  facet degeneration. Small extraforaminal disc protrusion on the left. Epidural abscess posteriorly on the left compressing the thecal sac and contributing to moderate spinal stenosis. No evidence of facet joint infection. L5-S1: Disc degeneration with left-sided disc protrusion. Posterior epidural abscess is ill-defined but is contributing to compression of thecal sac. There is enhancement  and fluid along the right S1 nerve root which may be the route of spread to the epidural space. No evidence of facet infection. IMPRESSION: 1. Multiple areas of infection. Large areas of abscess formation in the right paraspinous muscle at the L4 level. Large abscess in the right iliopsoas muscle and also in the right gluteus muscle. 2. 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 This may have entered the spinal canal via the right S1 nerve root. No evidence of discitis or facet joint infection. Electronically Signed   By: Franchot Gallo M.D.   On: 07/06/2019 13:34   Mr Pelvis W Wo Contrast  Result Date: 07/06/2019 CLINICAL DATA:  Right-sided low back pain for several weeks. No recent injury. Recent antibiotic therapy for urinary tract infection. EXAM: MRI PELVIS WITHOUT AND WITH CONTRAST TECHNIQUE: Multiplanar multisequence MR imaging of the pelvis was performed both before and after administration of intravenous contrast. CONTRAST:  10 cc Gadavist COMPARISON:  Abdominopelvic CT 07/06/2019 and 06/18/2019. FINDINGS: Urinary Tract: The visualized distal ureters and bladder appear unremarkable. Bowel: No bowel wall thickening, distention or surrounding inflammation identified within the pelvis. Vascular/Lymphatic: No enlarged pelvic lymph nodes identified. No significant vascular findings. Reproductive: There are areas T2 hyperintensity and decreased enhancement centrally in the prostate gland bilaterally which may be secondary to benign prosthetic hypertrophy or prostatitis. There is no surrounding inflammatory change. The seminal vesicles appear normal. Other: No ascites. Musculoskeletal: As demonstrated on earlier CT scan, there are multiple complex fluid collections surrounding the right sacroiliac joint. These demonstrate internal air bubbles, heterogeneity and irregular peripheral enhancement. Dominant component laterally in the gluteus musculature measures up to 9.8 x 6.1 cm on image 18/13. There  is a component in the right issue femoral fossa measuring up to 5.2 cm on image 27/3. This demonstrates inferior extension around the right common hamstring tendon. There are intrapelvic components extending through the greater sciatic foramen, involving the right piriformis muscle and measuring up to 6.3 cm on image 13/12. There are also irregular components within the right iliopsoas muscle measuring up to 3.8 cm on image 9/13. There are right posterior paraspinal components within the erector spinae musculature, measuring up to 6.2 cm on image 1/13. There is mild T2 hyperintensity and enhancement within the right sacroiliac joint, with associated marrow edema and enhancement in the adjacent right sacral ala. T2 hyperintensity and enhancement are also present within the left proximal common hamstring muscle. There are no focal fluid collections in the left pelvis. No significant hip joint effusion or abnormal synovial enhancement. IMPRESSION: 1. As seen on earlier CT, there are multiple complex fluid collections in the right pelvis with internal air and peripheral enhancement consistent with multifocal abscesses. 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 2. Inflammation extends into the erector spinae musculature on the right. Lumbar spine findings are dictated separately. 3. Mild T2 hyperintensity within the proximal left common hamstring muscle. 4. Central heterogeneity within the prostate gland is removed from these other findings and probably due to BPH. Prostatitis could have this appearance. Electronically  Signed   By: Richardean Sale M.D.   On: 07/06/2019 13:32   Ct Hip Right Wo Contrast  Result Date: 07/30/2019 CLINICAL DATA:  Right hip abscess. EXAM: CT OF THE RIGHT HIP WITHOUT CONTRAST TECHNIQUE: Multidetector CT imaging of the right hip was performed  according to the standard protocol. Multiplanar CT image reconstructions were also generated. COMPARISON:  None. FINDINGS: Bones/Joint/Cartilage Widening of the right sacroiliac joint with erosive changes most concerning for septic arthritis. Nondisplaced fracture of the inferior aspect of the right sacrum adjacent to the sacroiliac joint. Subchondral fracture involving the right ilium along the right SI joint. Small amount air along the anterior margin of the right sacroiliac joint within the iliopsoas muscle. No acute fracture or dislocation.  Normal alignment. Ligaments Ligaments are suboptimally evaluated by CT. Soft tissue, Muscles and Tendons Soft tissue wound overlying the right greater trochanter with air within the subcutaneous fat. There has been significant interval improvement in the complex fluid collection involving the right gluteal musculature extending into the pelvis. Small amount air in the left semimembranosus muscle partially visualized. No fluid collection or hematoma.  No soft tissue mass. IMPRESSION: 1. 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 Interval development of a subchondral fracture involving the right ilium and a nondisplaced fracture along the inferior aspect of the right sacrum. 2. Soft tissue wound overlying the right greater trochanter with air within the subcutaneous fat likely postsurgical. Significant interval improvement in the complex fluid collection involving the right gluteal musculature extending into the pelvis. 3. Small amount air in the left semimembranosus muscle partially visualized most concerning for persistent infection in the absence recent instrumentation involving this area. Electronically Signed   By: Kathreen Devoid   On: 07/30/2019 16:56   Mr Tibia Fibula Right W Wo Contrast  Result Date: 07/26/2019 CLINICAL DATA:  drainage posteromedial R knee, eval for abscess. Contacted by nurse regarding new drainage from posteromedial proximal lower R  leg. Started this am Pt notes pain only when touching areaNo other concerns elsewhere Currently on ancef for MSSA from R gluteal/hip abscess EXAM: MRI OF LOWER RIGHT EXTREMITY WITHOUT AND WITH CONTRAST TECHNIQUE: Multiplanar, multisequence MR imaging of the right lower extremity was performed both before and after administration of intravenous contrast. CONTRAST:  55mL GADAVIST GADOBUTROL 1 MMOL/ML IV SOLN COMPARISON:  None. FINDINGS: Soft tissues: Large complex fluid collection extends from the thigh across the posterior knee. It begins above the included field of view and thus is entire extent is not imaged on this exam. The collection terminates in the posterosuperior calf, below the knee. There is peripheral wall enhancement and septal enhancement throughout the collection. Small foci of signal void are noted within the fluid collection consistent with bubbles of air. Transversely, the collection measures a maximum of 11 x 9 cm. The collection is at least 27 cm from superior to inferior, but extends above the included field of view and therefore is longer from superior to inferior. The collection is centered on the hamstrings. The neurovascular structures are displaced anteriorly. There is diffuse surrounding soft tissue edema/cellulitis. Osseous structures: Normal signal. No evidence of edema or osteomyelitis. Right knee joint is normally spaced and aligned. No joint effusion. Visualized tendons are intact, normal in signal. IMPRESSION: 1. Large complex collection consistent with an abscess extending throughout the entire visible posterior thigh, centered on the posterior compartment involving the hamstring musculature. The collection measures 11 x 9 cm transversely. It extends through the visualized thigh across the knee to the  upper leg. There is surrounding soft tissue edema/cellulitis. 2. No evidence of osteomyelitis. Knee joint is unremarkable with no effusion to suggest septic arthritis. Electronically  Signed   By: Lajean Manes M.D.   On: 07/26/2019 21:17   Ct Aspiration  Result Date: 07/06/2019 INDICATION: 49 year old with multiple sites of infection including a lumbar paraspinal muscular abscess. Plan for CT-guided aspiration of the paraspinal abscess collection. EXAM: CT-GUIDED ASPIRATION OF PARASPINAL ABSCESS MEDICATIONS: Moderate sedation ANESTHESIA/SEDATION: Fentanyl 100 mcg IV; Versed 2.0 mg IV Moderate Sedation Time:  10 minutes The patient was continuously monitored during the procedure by the interventional radiology nurse under my direct supervision. COMPLICATIONS: None immediate. PROCEDURE: Informed written consent was obtained from the patient after a thorough discussion of the procedural risks, benefits and alternatives. All questions were addressed. A timeout was performed prior to the initiation of the procedure. Patient was placed prone on the CT scanner. CT images through the lower abdomen were obtained. The right paraspinal muscular abscess was identified at the L4-L5 level. Overlying skin was prepped with chlorhexidine. Sterile field was created. Skin was anesthetized with 1% lidocaine. Yueh catheter was directed into the abscess collection with CT guidance. Approximately 15 mL of brown purulent fluid was aspirated. No additional fluid could be aspirated. At this point in the procedure the patient was in severe pain from lying prone and the procedure was stopped. Bandage placed over the puncture site. FINDINGS: Low-density abscess in the right paraspinal musculature at L4-L5. 15 mL of brown purulent fluid was removed from this collection. IMPRESSION: CT-guided aspiration of the right paraspinal abscess at L4-L5. 15 mL of purulent fluid was removed and sent for culture. Electronically Signed   By: Markus Daft M.D.   On: 07/06/2019 19:22   Vas [place] Lower Extremity Venous (dvt)  Result Date: 08/01/2019  Lower Venous Study Indications: Follow up dvt.  Limitations: Bandages and open wound.  Comparison Study: previous study done 9/25 Performing Technologist: Abram Sander RVS  Examination Guidelines: A complete evaluation includes B-mode imaging, spectral Doppler, color Doppler, and power Doppler as needed of all accessible portions of each vessel. Bilateral testing is considered an integral part of a complete examination. Limited examinations for reoccurring indications may be performed as noted.  +---------+---------------+---------+-----------+----------+-----------------+  RIGHT     Compressibility Phasicity Spontaneity Properties Thrombus Aging     +---------+---------------+---------+-----------+----------+-----------------+  CFV       Full            Yes       Yes                                       +---------+---------------+---------+-----------+----------+-----------------+  SFJ       Full                                                                +---------+---------------+---------+-----------+----------+-----------------+  FV Prox   Full                                                                +---------+---------------+---------+-----------+----------+-----------------+  FV Mid    Full                                                                +---------+---------------+---------+-----------+----------+-----------------+  FV Distal Full                                                                +---------+---------------+---------+-----------+----------+-----------------+  POP                                                        Not visualized     +---------+---------------+---------+-----------+----------+-----------------+  PTV       None                                             Age Indeterminate  +---------+---------------+---------+-----------+----------+-----------------+  PERO                                                       Not visualized     +---------+---------------+---------+-----------+----------+-----------------+    +----+---------------+---------+-----------+----------+--------------+  LEFT Compressibility Phasicity Spontaneity Properties Thrombus Aging  +----+---------------+---------+-----------+----------+--------------+  CFV  Full            Yes       Yes                                    +----+---------------+---------+-----------+----------+--------------+     Summary: Right: Findings consistent with age indeterminate deep vein thrombosis involving the right posterior tibial veins. No cystic structure found in the popliteal fossa. Left: No evidence of common femoral vein obstruction.  *See table(s) above for measurements and observations. Electronically signed by Harold Barban MD on 08/01/2019 at 4:08:15 PM.    Final    Vas [place] Lower Extremity Venous (dvt)  Result Date: 07/28/2019  Lower Venous Study Indications: Edema, abcess right proximal calf, and Pain.  Comparison Study: 07/11/19 RLE negative Performing Technologist: June Leap RDMS, RVT  Examination Guidelines: A complete evaluation includes B-mode imaging, spectral Doppler, color Doppler, and power Doppler as needed of all accessible portions of each vessel. Bilateral testing is considered an integral part of a complete examination. Limited examinations for reoccurring indications may be performed as noted.  +---------+---------------+---------+-----------+----------+--------------+  RIGHT     Compressibility Phasicity Spontaneity Properties Thrombus Aging  +---------+---------------+---------+-----------+----------+--------------+  CFV       Full            Yes       Yes                                    +---------+---------------+---------+-----------+----------+--------------+  SFJ       Full                                                             +---------+---------------+---------+-----------+----------+--------------+  FV Prox   Full                                                              +---------+---------------+---------+-----------+----------+--------------+  FV Mid    Full                                                             +---------+---------------+---------+-----------+----------+--------------+  FV Distal Full                                                             +---------+---------------+---------+-----------+----------+--------------+  PFV       Full                                                             +---------+---------------+---------+-----------+----------+--------------+  POP                                                        Not visualized  +---------+---------------+---------+-----------+----------+--------------+  PTV       None                                             Acute           +---------+---------------+---------+-----------+----------+--------------+  PERO      Full                                                             +---------+---------------+---------+-----------+----------+--------------+   +----+---------------+---------+-----------+----------+--------------+  LEFT Compressibility Phasicity Spontaneity Properties                 +----+---------------+---------+-----------+----------+--------------+  CFV  Not visualized  +----+---------------+---------+-----------+----------+--------------+     Summary: Right: Findings consistent with acute deep vein thrombosis involving the right posterior tibial veins.  *See table(s) above for measurements and observations. Electronically signed by Curt Jews MD on 07/28/2019 at 4:04:40 AM.    Final    Vas [place] Lower Extremity Venous (dvt)  Result Date: 07/09/2019  Lower Venous Study Indications: Pain.  Risk Factors: Abscess of spinal canal, right gluteal muscle and iliopsoas muscle. Comparison Study: No prior study on file Performing Technologist: Sharion Dove RVS  Examination Guidelines: A complete evaluation includes B-mode imaging, spectral  Doppler, color Doppler, and power Doppler as needed of all accessible portions of each vessel. Bilateral testing is considered an integral part of a complete examination. Limited examinations for reoccurring indications may be performed as noted.  +---------+---------------+---------+-----------+----------+--------------+  RIGHT     Compressibility Phasicity Spontaneity Properties Thrombus Aging  +---------+---------------+---------+-----------+----------+--------------+  CFV       Full            Yes       Yes                                    +---------+---------------+---------+-----------+----------+--------------+  SFJ       Full                                                             +---------+---------------+---------+-----------+----------+--------------+  FV Prox   Full                                                             +---------+---------------+---------+-----------+----------+--------------+  FV Mid    Full                                                             +---------+---------------+---------+-----------+----------+--------------+  FV Distal Full                                                             +---------+---------------+---------+-----------+----------+--------------+  PFV       Full                                                             +---------+---------------+---------+-----------+----------+--------------+  POP       Full            Yes       Yes                                    +---------+---------------+---------+-----------+----------+--------------+  PTV       Full                                                             +---------+---------------+---------+-----------+----------+--------------+  PERO      Full                                                             +---------+---------------+---------+-----------+----------+--------------+   +----+---------------+---------+-----------+----------+--------------+   LEFT Compressibility Phasicity Spontaneity Properties Thrombus Aging  +----+---------------+---------+-----------+----------+--------------+  CFV  Full            Yes       Yes                                    +----+---------------+---------+-----------+----------+--------------+     Summary: Right: There is no evidence of deep vein thrombosis in the lower extremity. Left: No evidence of common femoral vein obstruction.  *See table(s) above for measurements and observations. Electronically signed by Harold Barban MD on 07/09/2019 at 1:13:14 AM.    Final    [place] Ekg Site Rite  Result Date: 07/14/2019 If Site Rite image not attached, placement could not be confirmed due to current cardiac rhythm.    LOS: 30 days   Oren Binet, MD  [facility]  If 7PM-7AM, please contact night-coverage  Please page via [email or web address]  Go to [email or web address] and use [facility]'s universal password to access. If you do not have the password, please contact the hospital operator.  Locate the [facility] provider you are looking for under [facility] and page to a number that you can be directly reached. If you still have difficulty reaching the provider, please page the [facility] (Director on Call) for the Hospitalists listed on amion for assistance.  08/05/2019, 11:09 AM

## 2019-08-06 LAB — GLUCOSE, CAPILLARY
Glucose-Capillary: 115 mg/dL — ABNORMAL HIGH (ref 70–99)
Glucose-Capillary: 136 mg/dL — ABNORMAL HIGH (ref 70–99)
Glucose-Capillary: 102 mg/dL — ABNORMAL HIGH (ref 70–99)
Glucose-Capillary: 127 mg/dL — ABNORMAL HIGH (ref 70–99)

## 2019-08-06 NOTE — Progress Notes (Signed)
Transitions of Care Pharmacist Note  Bradley Cox is a 48 y.o. male that has been diagnosed with a DVT and will be prescribed Xarelto (rivaroxaban) at discharge.   Patient Education: I provided the following education on 08/06/19 to the patient: How to take the medication Described what the medication is Signs of bleeding Signs/symptoms of VTE and stroke  Answered their questions  Discharge Medications Plan: The patient wants to have their discharge medications filled by the Transitions of Care pharmacy rather than their usual pharmacy.  The primary doctor for this patient has been contacted to send all discharge medication prescriptions to the [facility] pharmacy, the discharge orders pharmacy has been changed to the [facility] pharmacy, the patient will receive a phone call regarding co-pay, and their medications will be delivered by the [facility] pharmacy.   Insurance information: N/A   Thank you,   Lorel Monaco, PharmD PGY1 Ambulatory Care Resident [employer] # [phone]  August 06, 2019

## 2019-08-06 NOTE — Care Management (Signed)
    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  A cane  will not resolve issue with performing activities of daily living. A wheelchair will allow patient to safely perform daily activities. Patient can safely propel the wheelchair in the home or has a caregiver who can provide assistance. Length of need lifetime  Accessories: elevating leg rests (ELRs), wheel locks, extensions and anti-tippers.  Seat and back cushions   Extra heavy duty   08/06/19 0838   08/06/19 0810  For home use only DME Walker rolling  Once    Question:  Patient needs a walker to treat with the following condition  Answer:  Weakness   08/06/19 0809   08/06/19 0810  For home use only DME 3 n 1  Once     08/06/19 0809

## 2019-08-06 NOTE — Progress Notes (Signed)
Patient's wound vac was alarming and saying that there was a blockage. Attempted multiple times to remove the exudate from the connector port but was unsuccessful. Completed a dressing change with assistance from [place], Nellie.

## 2019-08-06 NOTE — Plan of Care (Signed)

## 2019-08-06 NOTE — Progress Notes (Addendum)
PROGRESS NOTE        PATIENT DETAILS Name: EMPEROR HIRTE Age: 48 y.o. Sex: male Date of Birth: 05/09/1971 Admit Date: 07/05/2019 Admitting Physician Norval Morton, MD [id number], Apolonio Schneiders, NP  Brief Narrative: Patient is a 48 y.o. 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  Has undergone serial debridement-evaluated by ID-with recommendations to continue with Ancef-stop date of 10/26.  Subjective: Pain in the operative site is stable-had around 300 cc output from the [facility] overnight-already around 200 cc in the [facility] at this morning (VAC was changed at 3 AM this morning)  Assessment/Plan: Sepsis secondary to disseminated MSSA infection including bacteremia, L3-S1 paraspinal abscess, right psoas abscess right posterior thigh abscess: Sepsis pathophysiology has resolved-clinically improved-patient has undergone serial debridement of the right thigh abscess by orthopedics.  Patient is s/p CT-guided drainage of the paraspinal abscess by IR.  ID recommending to continue Ancef with stop date of 10/26.  Continues to have significant output from the [facility] site-not safe for discharge at this point-awaiting further recommendations from the orthopedic team-awaiting callback from the orthopedic team.  Addendum/late entry: Spoke with Dr Demetra Shiner I&D scheduled for Wednesday-HOLD Xarelto from 10/6  Anemia: Multifactorial etiology-some amount of blood loss from repeated I&D's but suspect mostly from anemia related to critical illness.  Hemoglobin stable-continue with Xarelto.  Repeat CBC tomorrow.  Patient is s/p PRBC transfusion on 9/27 and 9/29.   DVT right lower extremity: Probably provoked-given hospitalization/repeated surgeries.  Given limited ambulation- will  be at risk for further clots and propagation of this DVT.  Difficult situation-has severe anemia requiring 2 units of PRBC transfusion-however after speaking with vascular surgery (Dr. Trula Slade on 9/30) and orthopedics (Dr. Doreatha Martin) we elected to start anticoagulation with IV heparin-patient was monitored for a few days-and then subsequently transitioned to [place] on 10/3.  Given significant output from the [facility] site-hold Xarelto for now-we will discuss with orthopedics before resuming anticoagulation.  DKA: Resolved  Insulin-dependent DM-2: CBGs currently stable-continue NPH 28 units twice daily, 8 units of NovoLog with meals and SSI  HTN: BP stable-continue hydralazine.  Asthma: Stable without any evidence of bronchospasm.  Obesity: Estimated body mass index is 41.78 kg/m as calculated from the following:   Height as of this encounter: 6\' 6"  (1.981 m).   Weight as of this encounter: 164 kg.   Diet: Diet Order            Diet Carb Modified Fluid consistency: Thin; Room service appropriate? Yes  Diet effective now               DVT Prophylaxis: IV heparin.  Code Status: Full code   Family Communication: None at bedside  Disposition Plan: Remain inpatient-Per CIR-not a candidate for inpatient rehab-we will plan on going home with home health services.  Main barrier for discharge appears to be significant output from [facility] site-more than 600 cc in the past 24 hours.  Antimicrobial agents: Anti-infectives (From admission, onward)   Start     Dose/Rate Route Frequency Ordered Stop   08/01/19 1600  amoxicillin (AMOXIL) capsule 500 mg     500 mg Oral  Once 08/01/19 1509 08/01/19 1607   08/01/19 1315  penicillin G  in sodium chloride 10,000 units/mL syringe for skin test - 1st bleb     1,000 Units Intradermal  Once 08/01/19 1042 08/01/19 1535   08/01/19 1315  penicillin G in sodium chloride 10,000 units/mL syringe for skin test - 2nd bleb     1,000 Units Intradermal  Once 08/01/19  1042 08/01/19 1537   08/01/19 1300  penicillin G in sodium chloride 10,000 units/mL syringe for skin test     1,000 Units Topical  Once 08/01/19 1042 08/01/19 1535   07/17/19 1857  gentamicin (GARAMYCIN) injection  Status:  Discontinued       As needed 07/17/19 1857 07/17/19 2009   07/17/19 1857  vancomycin (VANCOCIN) powder  Status:  Discontinued       As needed 07/17/19 1858 07/17/19 2009   07/10/19 1600  metroNIDAZOLE (FLAGYL) IVPB 500 mg  Status:  Discontinued     500 mg 100 mL/hr over 60 Minutes Intravenous Every 8 hours 07/10/19 1549 07/13/19 0939   07/09/19 0839  vancomycin (VANCOCIN) powder  Status:  Discontinued       As needed 07/09/19 0839 07/09/19 0935   07/09/19 0839  tobramycin (NEBCIN) powder  Status:  Discontinued       As needed 07/09/19 0840 07/09/19 0935   07/07/19 1830  ceFAZolin (ANCEF) IVPB 2g/100 mL premix     2 g 200 mL/hr over 30 Minutes Intravenous Every 8 hours 07/07/19 1333     07/07/19 0900  ceFAZolin (ANCEF) IVPB 2g/100 mL premix  Status:  Discontinued     2 g 200 mL/hr over 30 Minutes Intravenous Every 8 hours 07/07/19 0847 07/07/19 1333   07/06/19 2030  clindamycin (CLEOCIN) IVPB 600 mg  Status:  Discontinued     600 mg 100 mL/hr over 30 Minutes Intravenous Every 8 hours 07/06/19 2010 07/07/19 0847   07/06/19 1800  vancomycin (VANCOCIN) 1,500 mg in sodium chloride 0.9 % 500 mL IVPB  Status:  Discontinued     1,500 mg 250 mL/hr over 120 Minutes Intravenous Every 12 hours 07/06/19 0820 07/07/19 0847   07/06/19 1400  ceFEPIme (MAXIPIME) 2 g in sodium chloride 0.9 % 100 mL IVPB  Status:  Discontinued     2 g 200 mL/hr over 30 Minutes Intravenous Every 8 hours 07/06/19 0504 07/07/19 0847   07/06/19 1400  vancomycin (VANCOCIN) 2,000 mg in sodium chloride 0.9 % 500 mL IVPB  Status:  Discontinued     2,000 mg 250 mL/hr over 120 Minutes Intravenous Every 8 hours 07/06/19 0504 07/06/19 0818   07/06/19 0745  clindamycin (CLEOCIN) IVPB 600 mg  Status:  Discontinued      600 mg 100 mL/hr over 30 Minutes Intravenous  Once 07/06/19 0737 07/06/19 2029   07/06/19 0500  ceFEPIme (MAXIPIME) 2 g in sodium chloride 0.9 % 100 mL IVPB     2 g 200 mL/hr over 30 Minutes Intravenous  Once 07/06/19 0451 07/06/19 1335   07/06/19 0500  vancomycin (VANCOCIN) 2,500 mg in sodium chloride 0.9 % 500 mL IVPB     2,500 mg 250 mL/hr over 120 Minutes Intravenous  Once 07/06/19 0451 07/06/19 1335      Procedures: 9/4>> CT-guided aspiration of right paraspinal abscess at L4-L5 07/09/19>>  incision and drainage of the right hip/pelvic abscess along with neuroplasty of the right sciatic nerve by neurosurgery 9/9>>TEE 07/17/2019>>I&D of right gluteal abscess with drainage of 300 cc purulent fluid.   07/20/19>> Repeat irrigation and debridement right hip and application of wound VAC  07/27/19>>  incision and drainage of posterior right thigh abscess -wound VAC placement 9/28>> repeat irrigation and debridement of right thigh abscess, incisional wound VAC placement.   CONSULTS:  ID, orthopedic surgery and Neurosurgery  Time spent: 25- minutes-Greater than 50% of this time was spent in counseling, explanation of diagnosis, planning of further management, and coordination of care.  MEDICATIONS: Scheduled Meds:  sodium chloride   Intravenous Once   sodium chloride   Intravenous Once   acetaminophen  1,000 mg Oral TID   Chlorhexidine Gluconate Cloth  6 each Topical Daily   gabapentin  300 mg Oral TID   hydrALAZINE  25 mg Oral BID   influenza vac split quadrivalent PF  0.5 mL Intramuscular Tomorrow-1000   insulin aspart  0-15 Units Subcutaneous TID WC   insulin aspart  0-5 Units Subcutaneous QHS   insulin aspart  8 Units Subcutaneous TID WC   insulin NPH Human  28 Units Subcutaneous BID AC & HS   iron polysaccharides  150 mg Oral Daily   methocarbamol  750 mg Oral TID   metoCLOPramide  10 mg Oral TID AC & HS   polyethylene glycol  17 g Oral BID   [START ON  08/25/2019] rivaroxaban  20 mg Oral Q supper   Followed by   rivaroxaban  15 mg Oral BID WC   senna-docusate  1 tablet Oral BID   sodium chloride flush  10-40 mL Intracatheter Q12H   Continuous Infusions:   ceFAZolin (ANCEF) IV 2 g (08/06/19 1007)   PRN Meds:.albuterol, diphenhydrAMINE, hydrALAZINE, HYDROmorphone, ondansetron **OR** ondansetron (ZOFRAN) IV, oxyCODONE, sodium chloride flush, zolpidem   PHYSICAL EXAM: Vital signs: Vitals:   08/05/19 1528 08/05/19 1551 08/05/19 2022 08/06/19 0419  BP:  134/83 120/76 119/74  Pulse: 95 90 92 97  Resp:  16    Temp:  98.3 F (36.8 C) 98.8 F (37.1 C) 98.7 F (37.1 C)  TempSrc:  Oral Oral Oral  SpO2:  98% 99% 97%  Weight:      Height:       Filed Weights   07/05/19 1807 07/06/19 2116 07/27/19 1113  Weight: (!) 167.4 kg (!) 164 kg (!) 164 kg   Body mass index is 41.78 kg/m.   Gen Exam:Alert awake-not in any distress HEENT:atraumatic, normocephalic Chest: B/L clear to auscultation anteriorly CVS:S1S2 regular Abdomen:soft non tender, non distended Extremities:no edema Neurology: Non focal Skin: no rash  I have personally reviewed following labs and imaging studies  LABORATORY DATA: CBC: Recent Labs  Lab 07/31/19 0745  08/01/19 0547 08/02/19 0430 08/03/19 0711 08/04/19 0813 08/05/19 0337  WBC 5.5  --  9.5 6.1 6.0 5.4 5.1  NEUTROABS 3.4  --  7.0  --   --   --   --   HGB 6.9*   < > 7.7* 7.6* 8.0* 7.4* 7.5*  HCT 21.5*   < > 23.9* 24.7* 25.8* 24.4* 25.1*  MCV 96.0  --  96.8 99.2 95.9 97.6 99.6  PLT 299  --  302 279 294 283 270   < > = values in this interval not displayed.    Basic Metabolic Panel: Recent Labs  Lab 07/31/19 0745 08/01/19 0547 08/02/19 0430  NA 134* 133* 135  K 3.8 3.5 3.4*  CL 94* 95* 98  CO2 29 29 28   GLUCOSE 183* 92 136*  BUN <5* 5* <5*  CREATININE 0.55* 0.61 0.65  CALCIUM 8.6* 8.1* 8.3*    GFR: Estimated Creatinine Clearance: 188.1 mL/min (by C-G formula based on  SCr of 0.65  mg/dL).  Liver Function Tests: No results for input(s): AST, ALT, ALKPHOS, BILITOT, PROT, ALBUMIN in the last 168 hours. No results for input(s): LIPASE, AMYLASE in the last 168 hours. No results for input(s): AMMONIA in the last 168 hours.  Coagulation Profile: No results for input(s): INR, PROTIME in the last 168 hours.  Cardiac Enzymes: No results for input(s): CKTOTAL, CKMB, CKMBINDEX, TROPONINI in the last 168 hours.  BNP (last 3 results) No results for input(s): PROBNP in the last 8760 hours.  HbA1C: No results for input(s): HGBA1C in the last 72 hours.  CBG: Recent Labs  Lab 08/05/19 0818 08/05/19 1240 08/05/19 1738 08/05/19 2112 08/06/19 0826  GLUCAP 156* 153* 144* 127* 115*    Lipid Profile: No results for input(s): CHOL, HDL, LDLCALC, TRIG, CHOLHDL, LDLDIRECT in the last 72 hours.  Thyroid Function Tests: No results for input(s): TSH, T4TOTAL, FREET4, T3FREE, THYROIDAB in the last 72 hours.  Anemia Panel: No results for input(s): VITAMINB12, FOLATE, FERRITIN, TIBC, IRON, RETICCTPCT in the last 72 hours.  Urine analysis:    Component Value Date/Time   COLORURINE YELLOW 07/06/2019 [place] 07/06/2019 0437   LABSPEC 1.024 07/06/2019 0437   PHURINE 5.0 07/06/2019 0437   GLUCOSEU >=500 (A) 07/06/2019 0437   HGBUR MODERATE (A) 07/06/2019 0437   BILIRUBINUR NEGATIVE 07/06/2019 0437   KETONESUR 80 (A) 07/06/2019 0437   PROTEINUR NEGATIVE 07/06/2019 0437   NITRITE NEGATIVE 07/06/2019 0437   LEUKOCYTESUR NEGATIVE 07/06/2019 0437    Sepsis Labs: Lactic Acid, Venous    Component Value Date/Time   LATICACIDVEN 2.6 ([place]) 07/06/2019 2213    MICROBIOLOGY: Recent Results (from the past 240 hour(s))  Aerobic/Anaerobic Culture (surgical/deep wound)     Status: None   Collection Time: 07/27/19 12:56 PM   Specimen: PATH Other; Tissue  Result Value Ref Range Status   Specimen Description ABSCESS RIGHT THIGH  Final   Special Requests NONE  Final    Gram Stain NO WBC SEEN NO ORGANISMS SEEN   Final   Culture   Final    FEW STAPHYLOCOCCUS AUREUS NO ANAEROBES ISOLATED Performed at [facility], [place] [address], [place], [place] [address]    Report Status 08/01/2019 FINAL  Final   Organism ID, Bacteria STAPHYLOCOCCUS AUREUS  Final      Susceptibility   Staphylococcus aureus - MIC*    CIPROFLOXACIN <=0.5 SENSITIVE Sensitive     ERYTHROMYCIN >=8 RESISTANT Resistant     GENTAMICIN <=0.5 SENSITIVE Sensitive     OXACILLIN <=0.25 SENSITIVE Sensitive     TETRACYCLINE >=16 RESISTANT Resistant     VANCOMYCIN 1 SENSITIVE Sensitive     TRIMETH/SULFA <=10 SENSITIVE Sensitive     CLINDAMYCIN RESISTANT Resistant     RIFAMPIN <=0.5 SENSITIVE Sensitive     Inducible Clindamycin POSITIVE Resistant     * FEW STAPHYLOCOCCUS AUREUS    RADIOLOGY STUDIES/RESULTS: Dg Abd 1 View  Result Date: 07/15/2019 CLINICAL DATA:  Abdominal pain. EXAM: ABDOMEN - 1 VIEW COMPARISON:  07/12/2019 FINDINGS: Nondistended gas-filled loops of small bowel and colon noted. Nondistended gas-filled stomach is present. No suspicious calcifications are present. Stool in the colon is present. IMPRESSION: Nonspecific nonobstructive bowel gas pattern with nondistended gas-filled stomach, small bowel and colon. Electronically Signed   By: Margarette Canada M.D.   On: 07/15/2019 17:02   Dg Abd 1 View  Result Date: 07/12/2019 CLINICAL DATA:  NG tube placement EXAM: ABDOMEN - 1 VIEW COMPARISON:  None. FINDINGS:  Nasogastric tube tip and side port project over the stomach. Lung bases are clear. No dilated bowel is visible. IMPRESSION: NG tube tip and side port project over the stomach. Electronically Signed   By: Ulyses Jarred M.D.   On: 07/12/2019 00:33   Dg Abd 1 View  Result Date: 07/11/2019 CLINICAL DATA:  Nausea and vomiting with abdominal pain EXAM: ABDOMEN - 1 VIEW COMPARISON:  07/06/2019 FINDINGS: Marked gaseous dilatation of the stomach. Otherwise nonobstructed gas pattern with  gas in the small and large bowel. Findings could be secondary to enteritis or mild ileus. IMPRESSION: 1. Incompletely visualized marked gaseous dilatation of stomach, consider NG tube decompression. Outlet obstruction could be considered. 2. Remainder of the gas pattern shows air-filled small and large bowel in a nonobstructed pattern, question enteritis or mild ileus. Electronically Signed   By: Donavan Foil M.D.   On: 07/11/2019 21:58   Ct Angio Chest Pe W Or Wo Contrast  Result Date: 08/01/2019 CLINICAL DATA:  48 year old male with syncope. EXAM: CT ANGIOGRAPHY CHEST WITH CONTRAST TECHNIQUE: Multidetector CT imaging of the chest was performed using the standard protocol during bolus administration of intravenous contrast. Multiplanar CT image reconstructions and MIPs were obtained to evaluate the vascular anatomy. CONTRAST:  163mL OMNIPAQUE IOHEXOL 350 MG/ML SOLN COMPARISON:  None. FINDINGS: Cardiovascular: Borderline cardiomegaly. No pericardial effusion. Multi vessel coronary vascular calcification. The thoracic aorta is unremarkable. Evaluation of the pulmonary arteries is somewhat limited due to suboptimal opacification and visualization of the peripheral branches and respiratory motion artifact. No large or central pulmonary artery embolus identified. Right sided PICC with tip close to the cavoatrial junction noted. Mediastinum/Nodes: There is no hilar or mediastinal adenopathy. The esophagus and the thyroid gland are grossly unremarkable. No mediastinal fluid collection. Lungs/Pleura: Bibasilar linear atelectasis/scarring. No lobar consolidation, pleural effusion pneumothorax. The central airways are patent. Upper Abdomen: Multiple gallstones. The visualized upper abdomen is otherwise unremarkable. Musculoskeletal: No chest wall abnormality. No acute or significant osseous findings. Review of the MIP images confirms the above findings. IMPRESSION: 1. No acute intrathoracic pathology. No CT evidence of  central pulmonary artery embolus. 2. Cholelithiasis. Aortic Atherosclerosis (ICD10-I70.0). Electronically Signed   By: Anner Crete M.D.   On: 08/01/2019 20:55   Ct Hip Right Wo Contrast  Result Date: 07/30/2019 CLINICAL DATA:  Right hip abscess. EXAM: CT OF THE RIGHT HIP WITHOUT CONTRAST TECHNIQUE: Multidetector CT imaging of the right hip was performed according to the standard protocol. Multiplanar CT image reconstructions were also generated. COMPARISON:  None. FINDINGS: Bones/Joint/Cartilage Widening of the right sacroiliac joint with erosive changes most concerning for septic arthritis. Nondisplaced fracture of the inferior aspect of the right sacrum adjacent to the sacroiliac joint. Subchondral fracture involving the right ilium along the right SI joint. Small amount air along the anterior margin of the right sacroiliac joint within the iliopsoas muscle. No acute fracture or dislocation.  Normal alignment. Ligaments Ligaments are suboptimally evaluated by CT. Soft tissue, Muscles and Tendons Soft tissue wound overlying the right greater trochanter with air within the subcutaneous fat. There has been significant interval improvement in the complex fluid collection involving the right gluteal musculature extending into the pelvis. Small amount air in the left semimembranosus muscle partially visualized. No fluid collection or hematoma.  No soft tissue mass. IMPRESSION: 1. 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 Interval development of a subchondral fracture involving the right ilium and a nondisplaced fracture along the inferior aspect of the right sacrum. 2. Soft tissue wound  overlying the right greater trochanter with air within the subcutaneous fat likely postsurgical. Significant interval improvement in the complex fluid collection involving the right gluteal musculature extending into the pelvis. 3. Small amount air in the left semimembranosus muscle partially visualized most  concerning for persistent infection in the absence recent instrumentation involving this area. Electronically Signed   By: Kathreen Devoid   On: 07/30/2019 16:56   Mr Tibia Fibula Right W Wo Contrast  Result Date: 07/26/2019 CLINICAL DATA:  drainage posteromedial R knee, eval for abscess. Contacted by nurse regarding new drainage from posteromedial proximal lower R leg. Started this am Pt notes pain only when touching areaNo other concerns elsewhere Currently on ancef for MSSA from R gluteal/hip abscess EXAM: MRI OF LOWER RIGHT EXTREMITY WITHOUT AND WITH CONTRAST TECHNIQUE: Multiplanar, multisequence MR imaging of the right lower extremity was performed both before and after administration of intravenous contrast. CONTRAST:  41mL GADAVIST GADOBUTROL 1 MMOL/ML IV SOLN COMPARISON:  None. FINDINGS: Soft tissues: Large complex fluid collection extends from the thigh across the posterior knee. It begins above the included field of view and thus is entire extent is not imaged on this exam. The collection terminates in the posterosuperior calf, below the knee. There is peripheral wall enhancement and septal enhancement throughout the collection. Small foci of signal void are noted within the fluid collection consistent with bubbles of air. Transversely, the collection measures a maximum of 11 x 9 cm. The collection is at least 27 cm from superior to inferior, but extends above the included field of view and therefore is longer from superior to inferior. The collection is centered on the hamstrings. The neurovascular structures are displaced anteriorly. There is diffuse surrounding soft tissue edema/cellulitis. Osseous structures: Normal signal. No evidence of edema or osteomyelitis. Right knee joint is normally spaced and aligned. No joint effusion. Visualized tendons are intact, normal in signal. IMPRESSION: 1. Large complex collection consistent with an abscess extending throughout the entire visible posterior thigh,  centered on the posterior compartment involving the hamstring musculature. The collection measures 11 x 9 cm transversely. It extends through the visualized thigh across the knee to the upper leg. There is surrounding soft tissue edema/cellulitis. 2. No evidence of osteomyelitis. Knee joint is unremarkable with no effusion to suggest septic arthritis. Electronically Signed   By: Lajean Manes M.D.   On: 07/26/2019 21:17   Vas [place] Lower Extremity Venous (dvt)  Result Date: 08/01/2019  Lower Venous Study Indications: Follow up dvt.  Limitations: Bandages and open wound. Comparison Study: previous study done 9/25 Performing Technologist: Abram Sander RVS  Examination Guidelines: A complete evaluation includes B-mode imaging, spectral Doppler, color Doppler, and power Doppler as needed of all accessible portions of each vessel. Bilateral testing is considered an integral part of a complete examination. Limited examinations for reoccurring indications may be performed as noted.  +---------+---------------+---------+-----------+----------+-----------------+  RIGHT     Compressibility Phasicity Spontaneity Properties Thrombus Aging     +---------+---------------+---------+-----------+----------+-----------------+  CFV       Full            Yes       Yes                                       +---------+---------------+---------+-----------+----------+-----------------+  SFJ       Full                                                                +---------+---------------+---------+-----------+----------+-----------------+  FV Prox   Full                                                                +---------+---------------+---------+-----------+----------+-----------------+  FV Mid    Full                                                                +---------+---------------+---------+-----------+----------+-----------------+  FV Distal Full                                                                 +---------+---------------+---------+-----------+----------+-----------------+  POP                                                        Not visualized     +---------+---------------+---------+-----------+----------+-----------------+  PTV       None                                             Age Indeterminate  +---------+---------------+---------+-----------+----------+-----------------+  PERO                                                       Not visualized     +---------+---------------+---------+-----------+----------+-----------------+   +----+---------------+---------+-----------+----------+--------------+  LEFT Compressibility Phasicity Spontaneity Properties Thrombus Aging  +----+---------------+---------+-----------+----------+--------------+  CFV  Full            Yes       Yes                                    +----+---------------+---------+-----------+----------+--------------+     Summary: Right: Findings consistent with age indeterminate deep vein thrombosis involving the right posterior tibial veins. No cystic structure found in the popliteal fossa. Left: No evidence of common femoral vein obstruction.  *See table(s) above for measurements and observations. Electronically signed by Harold Barban MD on 08/01/2019 at 4:08:15 PM.    Final    Vas [place] Lower Extremity Venous (dvt)  Result Date: 07/28/2019  Lower Venous Study Indications: Edema, abcess right proximal calf, and Pain.  Comparison Study: 07/11/19 RLE negative Performing Technologist: June Leap RDMS, RVT  Examination Guidelines: A complete evaluation includes B-mode imaging, spectral Doppler, color Doppler, and power Doppler as needed of all accessible portions of each vessel. Bilateral testing is considered an integral part of a complete examination. Limited examinations for  reoccurring indications may be performed as noted.  +---------+---------------+---------+-----------+----------+--------------+  RIGHT      Compressibility Phasicity Spontaneity Properties Thrombus Aging  +---------+---------------+---------+-----------+----------+--------------+  CFV       Full            Yes       Yes                                    +---------+---------------+---------+-----------+----------+--------------+  SFJ       Full                                                             +---------+---------------+---------+-----------+----------+--------------+  FV Prox   Full                                                             +---------+---------------+---------+-----------+----------+--------------+  FV Mid    Full                                                             +---------+---------------+---------+-----------+----------+--------------+  FV Distal Full                                                             +---------+---------------+---------+-----------+----------+--------------+  PFV       Full                                                             +---------+---------------+---------+-----------+----------+--------------+  POP                                                        Not visualized  +---------+---------------+---------+-----------+----------+--------------+  PTV       None                                             Acute           +---------+---------------+---------+-----------+----------+--------------+  PERO      Full                                                             +---------+---------------+---------+-----------+----------+--------------+   +----+---------------+---------+-----------+----------+--------------+  LEFT Compressibility Phasicity Spontaneity Properties                 +----+---------------+---------+-----------+----------+--------------+  CFV                                                   Not visualized  +----+---------------+---------+-----------+----------+--------------+     Summary: Right: Findings consistent with acute deep vein thrombosis involving  the right posterior tibial veins.  *See table(s) above for measurements and observations. Electronically signed by Curt Jews MD on 07/28/2019 at 4:04:40 AM.    Final    [place] Ekg Site Rite  Result Date: 07/14/2019 If Site Rite image not attached, placement could not be confirmed due to current cardiac rhythm.    LOS: 31 days   Oren Binet, MD  [facility]  If 7PM-7AM, please contact night-coverage  Please page via [email or web address]  Go to [email or web address] and use [place]'s universal password to access. If you do not have the password, please contact the hospital operator.  Locate the [facility] provider you are looking for under [facility] and page to a number that you can be directly reached. If you still have difficulty reaching the provider, please page the [facility] (Director on Call) for the Hospitalists listed on amion for assistance.  08/06/2019, 11:13 AM

## 2019-08-06 NOTE — TOC Initial Note (Addendum)
Transition of Care [facility]) - Initial/Assessment Note    Patient Details  Name: Bradley Cox MRN: [id number] Date of Birth: 1970/12/04  Transition of Care [facility]) CM/SW Contact:    Marilu Favre, RN Phone Number: 08/06/2019, 12:02 PM  Clinical Narrative:                 Spoke to patient at bedside .   PCP is Dr Simona Huh at [facility] , however, patient would like to change PCP's . Patient does not have insurance. Patient plans to go stay with his father Jori Moll [id number] at [place], [place], at discharge. Patient does want to follow up at [facility] at discharge . He states he has transportation. Scheduled appointment for August 31, 2019 at 0930.  Patient wanting to use Transitions of Care pharmacy at discharge . He states he has 3$ co pay .  Patient has been at home before on IV ABX and did not qualify for charity care. 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 He paid for [facility] in past.   Referrals given to [facility] with [facility] and [facility] with [facility].   Patient has a walker at home already. Ordered wheelchair and 3 in 1    Expected Discharge Plan: [place] Barriers to Discharge: Continued Medical Work up   Patient Goals and CMS Choice Patient states their goals for this hospitalization and ongoing recovery are:: to go home CMS Medicare.gov Compare Post Acute Care list provided to:: Patient Choice offered to / list presented to : Patient  Expected Discharge Plan and Services Expected Discharge Plan: [place] In-house Referral: Clinical Social Work Discharge Planning Services: CM Consult Post Acute Care Choice: Home Health, Durable Medical Equipment Living arrangements for the past 2 months: [place]                 DME Arranged: 3-N-1, Walker rolling DME Agency: [employer] Date DME Agency Contacted: 08/06/19 Time DME  Agency Contacted: 17 Representative spoke with at DME Agency: [place]: RN, PT, Social Work [employer] Agency: [facility] ([place]) Date [place]: 08/06/19 Time [place]: 54 Representative spoke with at [place]: [place] Arrangements/Services Living arrangements for the past 2 months: [place] Lives with:: Parents Patient language and need for interpreter reviewed:: Yes Do you feel safe going back to the place where you live?: Yes   patgient states believes he needs the rehab  Need for Family Participation in Patient Care: Yes (Comment) Care giver support system in place?: Yes (comment) Current home services: DME(has a walker) Criminal Activity/Legal Involvement Pertinent to Current Situation/Hospitalization: No - Comment as needed  Activities of Daily Living Home Assistive Devices/Equipment: [place] Rile (specify type) ADL Screening (condition at time of admission) Patient's cognitive ability adequate to safely complete daily activities?: Yes Is the patient deaf or have difficulty hearing?: No Does the patient have difficulty seeing, even when wearing glasses/contacts?: No Does the patient have difficulty concentrating, remembering, or making decisions?: No Patient able to express need for assistance with ADLs?: Yes Does the patient have difficulty dressing or bathing?: No Independently performs ADLs?: Yes (appropriate for developmental age) Does the patient have difficulty walking or climbing stairs?: No Weakness of Legs: Right Weakness of Arms/Hands: None  Permission Sought/Granted Permission sought to share information with : Case Manager Permission granted  to share information with : Yes, Verbal Permission Granted  Share Information with NAME: Debi ( mother) , Jori Moll ( father)  Permission granted to share info w AGENCY: [facility], [place] granted to share info w Relationship:  mother  Permission granted to share info w Contact Information: [phone]  Emotional Assessment Appearance:: Appears older than stated age Attitude/Demeanor/Rapport: Engaged Affect (typically observed): Accepting Orientation: : Oriented to  Time, Oriented to Place, Oriented to Self, Oriented to Situation Alcohol / Substance Use: Not Applicable Psych Involvement: No (comment)  Admission diagnosis:  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 Acute DVT (deep venous thrombosis) ([place]) 07/29/2019  . MSSA bacteremia 07/10/2019  . Abscess of right hip 07/09/2019  . Paraspinal abscess ([place]) 07/09/2019  . 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 Sepsis ([place]) 07/06/2019  . AKI (acute kidney injury) ([place]) 07/06/2019  . Hyperkalemia 07/06/2019  . Obesity, Class III, BMI 40-49.9 (morbid obesity) ([place]) 01/21/2009  . GERD 01/21/2009  . DYSPHAGIA 01/21/2009  . Essential hypertension 01/07/2009  . ASTHMA 01/07/2009   PCP:  Simona Huh, NP Pharmacy:   [facility] [address], [place] - [address] [place] [address] Phone: [phone] Fax: [place], [place] [address] [place] [address] Phone: [phone] Fax: [phone]     Social Determinants of Health (SDOH) Interventions    Readmission Risk Interventions No flowsheet data found.

## 2019-08-06 NOTE — Progress Notes (Signed)
PT Cancellation Note  Patient Details Name: Bradley Cox MRN: [id number] DOB: 04-07-1971   Cancelled Treatment:    Reason Eval/Treat Not Completed: Medical issues which prohibited therapy per chart review, Xarelto on hold due to excessive drainage from vac- will also hold therapy for safety reasons as patient has acute R LE DVT. Will follow.    Deniece Ree PT, DPT, CBIS  Supplemental Physical Therapist [facility]    Pager [phone] Acute Rehab Office [phone]

## 2019-08-06 NOTE — Progress Notes (Signed)
Per chart review of PT notes:   Patient suffers from pain, reduced functional activity tolerance, reduced functional muscle weakness, and significant deconditioning which impairs their ability to perform daily activities like gait and independent transfers in the home.  A walker alone will not resolve the issues with performing activities of daily living. A wheelchair will allow patient to safely perform daily activities.  The patient can self propel in the home or has a caregiver who can provide assistance.     Deniece Ree PT, DPT, CBIS  Supplemental Physical Therapist [facility]    Pager [phone] Acute Rehab Office [phone]

## 2019-08-07 LAB — CBC
HCT: 25.8 % — ABNORMAL LOW (ref 39.0–52.0)
Hemoglobin: 7.6 g/dL — ABNORMAL LOW (ref 13.0–17.0)
MCH: 29.3 pg (ref 26.0–34.0)
MCHC: 29.5 g/dL — ABNORMAL LOW (ref 30.0–36.0)
MCV: 99.6 fL (ref 80.0–100.0)
Platelets: 259 10*3/uL (ref 150–400)
RBC: 2.59 MIL/uL — ABNORMAL LOW (ref 4.22–5.81)
RDW: 15.8 % — ABNORMAL HIGH (ref 11.5–15.5)
WBC: 6.5 10*3/uL (ref 4.0–10.5)
nRBC: 0 % (ref 0.0–0.2)

## 2019-08-07 LAB — BASIC METABOLIC PANEL
Anion gap: 11 (ref 5–15)
BUN: 7 mg/dL (ref 6–20)
CO2: 28 mmol/L (ref 22–32)
Calcium: 8.9 mg/dL (ref 8.9–10.3)
Chloride: 97 mmol/L — ABNORMAL LOW (ref 98–111)
Creatinine, Ser: 0.59 mg/dL — ABNORMAL LOW (ref 0.61–1.24)
GFR calc Af Amer: >60 mL/min (ref >60)
GFR calc non Af Amer: >60 mL/min (ref >60)
Glucose, Bld: 130 mg/dL — ABNORMAL HIGH (ref 70–99)
Potassium: 4.1 mmol/L (ref 3.5–5.1)
Sodium: 136 mmol/L (ref 135–145)

## 2019-08-07 LAB — GLUCOSE, CAPILLARY
Glucose-Capillary: 120 mg/dL — ABNORMAL HIGH (ref 70–99)
Glucose-Capillary: 120 mg/dL — ABNORMAL HIGH (ref 70–99)
Glucose-Capillary: 137 mg/dL — ABNORMAL HIGH (ref 70–99)
Glucose-Capillary: 140 mg/dL — ABNORMAL HIGH (ref 70–99)

## 2019-08-07 LAB — APTT: aPTT: 72 seconds — ABNORMAL HIGH (ref 24–36)

## 2019-08-07 MED ORDER — HEPARIN (PORCINE) 25000 UT/250ML-% IV SOLN
2600.0000 [IU]/h | INTRAVENOUS | Status: DC
  Administered 2019-08-07 – 2019-08-08 (×3): 2600 [IU]/h via INTRAVENOUS
  Filled 2019-08-07 (×4): qty 250

## 2019-08-07 NOTE — Progress Notes (Signed)
Ortho Trauma Note  S: Doing okay. Still with anemia. Vac continuing to have significant output. Minimal pain in back of leg, continues to have soreness and stiffness in right hip. Is ready to proceed with surgery tomorrow and hopeful he will be able to go home soon.   O: General - NAD, AAOx3 RLE - RLE incisional vac with excellent seal and function currently. 400 mL in vac canister currently. Output looks seeosanguinous, no purulence noted. Moves leg much better. Motor and sensory function intact.   Results for orders placed or performed during the hospital encounter of 07/05/19 (from the past 24 hour(s))  Glucose, capillary     Status: Abnormal   Collection Time: 08/06/19  8:26 AM  Result Value Ref Range   Glucose-Capillary 115 (H) 70 - 99 mg/dL  Glucose, capillary     Status: Abnormal   Collection Time: 08/06/19 12:54 PM  Result Value Ref Range   Glucose-Capillary 136 (H) 70 - 99 mg/dL  Glucose, capillary     Status: Abnormal   Collection Time: 08/06/19  4:57 PM  Result Value Ref Range   Glucose-Capillary 102 (H) 70 - 99 mg/dL  Glucose, capillary     Status: Abnormal   Collection Time: 08/06/19 10:05 PM  Result Value Ref Range   Glucose-Capillary 127 (H) 70 - 99 mg/dL  CBC     Status: Abnormal   Collection Time: 08/07/19  4:25 AM  Result Value Ref Range   WBC 6.5 4.0 - 10.5 K/uL   RBC 2.59 (L) 4.22 - 5.81 MIL/uL   Hemoglobin 7.6 (L) 13.0 - 17.0 g/dL   HCT 25.8 (L) 39.0 - 52.0 %   MCV 99.6 80.0 - 100.0 fL   MCH 29.3 26.0 - 34.0 pg   MCHC 29.5 (L) 30.0 - 36.0 g/dL   RDW 15.8 (H) 11.5 - 15.5 %   Platelets 259 150 - 400 K/uL   nRBC 0.0 0.0 - 0.2 %  Basic metabolic panel     Status: Abnormal   Collection Time: 08/07/19  4:25 AM  Result Value Ref Range   Sodium 136 135 - 145 mmol/L   Potassium 4.1 3.5 - 5.1 mmol/L   Chloride 97 (L) 98 - 111 mmol/L   CO2 28 22 - 32 mmol/L   Glucose, Bld 130 (H) 70 - 99 mg/dL   BUN 7 6 - 20 mg/dL   Creatinine, Ser 0.59 (L) 0.61 - 1.24 mg/dL    Calcium 8.9 8.9 - 10.3 mg/dL   GFR calc non Af Amer >60 >60 mL/min   GFR calc Af Amer >60 >60 mL/min   Anion gap 11 5 - 15    Plan: Plan to return to OR tomorrow for repeat I&D and wound closure of right leg. Continue incisional vac until then. If blockage alert would recommend connecting to wall suction. Continue anticoagulation per hospitalist team. NPO after midnight. Hopefully discharge home by end of week if all goes as planned.    Takeia Ciaravino A. [facility] ?[phone]? (phone)

## 2019-08-07 NOTE — TOC Progression Note (Addendum)
Transition of Care [facility]) - Progression Note    Patient Details  Name: Bradley Cox MRN: [id number] Date of Birth: [birth year]  Transition of Care [facility]) CM/SW Contact  Jacalyn Lefevre Edson Snowball, RN Phone Number: 08/07/2019, 1:24 PM  Clinical Narrative:     Patient does not qualify for charity home health ( income is too high ). Last time patient private paid for home health RN through [place] . Spoke with Mateo Flow with [facility] they do not have the staff to accept patient.   Spoke with Tommi Rumps with [facility] , he does not have the staff to accept for private pay.   [facility] may be able to assist if patient can private pay , pending what wound he will need.   Other option may be for patient to come to Short stay for PICC dressing changes and labs.   Pam with [place] will teach patient how to administer IV ABX prior to discharge.   Explained above to patient. Patient voiced understanding.  Expected Discharge Plan: [place] Barriers to Discharge: Continued Medical Work up  Expected Discharge Plan and Services Expected Discharge Plan: [place] In-house Referral: Clinical Social Work Discharge Planning Services: CM Consult Post Acute Care Choice: Home Health, Durable Medical Equipment Living arrangements for the past 2 months: [place]                 DME Arranged: 3-N-1, Walker rolling DME Agency: [employer] Date DME Agency Contacted: 08/06/19 Time DME Agency Contacted: 26 Representative spoke with at DME Agency: [place]: RN, PT, Social Work [employer] Agency: [place] ([place]) Date [place]: 08/06/19 Time [place]: 5 Representative spoke with at [place]: [place] ([place]) Interventions    Readmission Risk Interventions No flowsheet data found.

## 2019-08-07 NOTE — H&P (View-Only) (Signed)
Ortho Trauma Note  S: Doing okay. Still with anemia. Vac continuing to have significant output. Minimal pain in back of leg, continues to have soreness and stiffness in right hip. Is ready to proceed with surgery tomorrow and hopeful he will be able to go home soon.   O: General - NAD, AAOx3 RLE - RLE incisional vac with excellent seal and function currently. 400 mL in vac canister currently. Output looks seeosanguinous, no purulence noted. Moves leg much better. Motor and sensory function intact.   Results for orders placed or performed during the hospital encounter of 07/05/19 (from the past 24 hour(s))  Glucose, capillary     Status: Abnormal   Collection Time: 08/06/19  8:26 AM  Result Value Ref Range   Glucose-Capillary 115 (H) 70 - 99 mg/dL  Glucose, capillary     Status: Abnormal   Collection Time: 08/06/19 12:54 PM  Result Value Ref Range   Glucose-Capillary 136 (H) 70 - 99 mg/dL  Glucose, capillary     Status: Abnormal   Collection Time: 08/06/19  4:57 PM  Result Value Ref Range   Glucose-Capillary 102 (H) 70 - 99 mg/dL  Glucose, capillary     Status: Abnormal   Collection Time: 08/06/19 10:05 PM  Result Value Ref Range   Glucose-Capillary 127 (H) 70 - 99 mg/dL  CBC     Status: Abnormal   Collection Time: 08/07/19  4:25 AM  Result Value Ref Range   WBC 6.5 4.0 - 10.5 K/uL   RBC 2.59 (L) 4.22 - 5.81 MIL/uL   Hemoglobin 7.6 (L) 13.0 - 17.0 g/dL   HCT 25.8 (L) 39.0 - 52.0 %   MCV 99.6 80.0 - 100.0 fL   MCH 29.3 26.0 - 34.0 pg   MCHC 29.5 (L) 30.0 - 36.0 g/dL   RDW 15.8 (H) 11.5 - 15.5 %   Platelets 259 150 - 400 K/uL   nRBC 0.0 0.0 - 0.2 %  Basic metabolic panel     Status: Abnormal   Collection Time: 08/07/19  4:25 AM  Result Value Ref Range   Sodium 136 135 - 145 mmol/L   Potassium 4.1 3.5 - 5.1 mmol/L   Chloride 97 (L) 98 - 111 mmol/L   CO2 28 22 - 32 mmol/L   Glucose, Bld 130 (H) 70 - 99 mg/dL   BUN 7 6 - 20 mg/dL   Creatinine, Ser 0.59 (L) 0.61 - 1.24 mg/dL    Calcium 8.9 8.9 - 10.3 mg/dL   GFR calc non Af Amer >60 >60 mL/min   GFR calc Af Amer >60 >60 mL/min   Anion gap 11 5 - 15    Plan: Plan to return to OR tomorrow for repeat I&D and wound closure of right leg. Continue incisional vac until then. If blockage alert would recommend connecting to wall suction. Continue anticoagulation per hospitalist team. NPO after midnight. Hopefully discharge home by end of week if all goes as planned.    Barth Trella A. [facility] ?[phone] (phone)

## 2019-08-07 NOTE — Progress Notes (Signed)
[place] for Heparin drip Indication: DVT  Allergies  Allergen Reactions  . Tramadol Anaphylaxis    Patient Measurements: Height: 6\' 6"  (198.1 cm) Weight: (!) 361 lb 8.9 oz (164 kg) IBW/kg (Calculated) : 91.4 Heparin Dosing Weight: 128 kg  Vital Signs: Temp: 98.1 F (36.7 C) (10/06 0508) Temp Source: Oral (10/06 0508) BP: 114/73 (10/06 0508) Pulse Rate: 98 (10/06 0508)  Labs: Recent Labs    08/05/19 0337 08/07/19 0425  HGB 7.5* 7.6*  HCT 25.1* 25.8*  PLT 270 259  CREATININE  --  0.59*    Estimated Creatinine Clearance: 188.1 mL/min (A) (by C-G formula based on SCr of 0.59 mg/dL (L)).  Assessment: 48 yo male with Right LE DVT. H/O anemia requiring blood transfusion. Patient receiving rivaroxaban but now needing further surgery on 10/7. MD wishes to bridge with heparin drip pending surgery. Last dose of rivarovaban was 2212 on 10/5. Will start heparin at 1000 today.   Hgb remains low-stable. Platelets are within normal limits and stable. No bleeding noted. Will have to use APTT to guide therapy due to effect of rivaroxaban on HL. Shoot for lower end of therapeutic range per MD for anemia.   Goal of Therapy:  Heparin level 0.3-0.5 APTT 66-85 sec Monitor platelets by anticoagulation protocol: Yes   Plan:  Start heparin drip at 2600 units/hr (previous rate), no bolus APTT in 6 hours Daily HL and APTT Monitor for bleeding.    Jabarri Stefanelli A. Levada Dy, PharmD, BCPS, FNKF Clinical Pharmacist [place] Please utilize Amion for appropriate phone number to reach the unit pharmacist ([place])   08/07/2019, 9:47 AM

## 2019-08-07 NOTE — Progress Notes (Addendum)
PROGRESS NOTE        PATIENT DETAILS Name: Bradley Cox Age: 48 y.o. Sex: male Date of Birth: January 11, 1971 Admit Date: 07/05/2019 Admitting Physician Norval Morton, MD [id number], Apolonio Schneiders, NP  Brief Narrative: Patient is a 48 y.o. 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  Has undergone serial debridement-evaluated by ID-with recommendations to continue with Ancef-stop date of 10/26.  Hospital course complicated by right lower extremity DVT and development of significant output from the incisional VAC site-orthopedics planning on repeat I&D on 10/7.  Subjective: Appears stable-pain is currently well controlled.  Orthopedics planning on repeat I&D on 10/7.  Assessment/Plan: Sepsis secondary to disseminated MSSA infection including bacteremia, L3-S1 paraspinal abscess, right psoas abscess right posterior thigh abscess: Sepsis pathophysiology has resolved-clinically improved-patient has undergone serial debridement of the right thigh abscess by orthopedics.  Patient is s/p CT-guided drainage of the paraspinal abscess by IR.  ID recommending to continue Ancef with stop date of 10/26.  Unfortunately-has developed significant output from incisional VAC-orthopedics planning on repeat I&D and wound closure of the right leg on 10/6.  Anemia: Multifactorial etiology-some amount of blood loss from repeated I&D's but suspect mostly from anemia related to critical illness.  Hemoglobin remains low but stable.  Has been tolerating Xarelto well for the past few days-but since plans are for repeat I&D tomorrow-hold Xarelto-start IV heparin.  Continue to follow CBC.  Patient is s/p PRBC transfusion on 9/27 and 9/29.   DVT right lower extremity: Probably provoked-given  hospitalization/repeated surgeries.  Given limited ambulation- will be at risk for further clots and propagation of this DVT.  Difficult situation-has severe anemia requiring 2 units of PRBC transfusion-however after speaking with vascular surgery (Dr. Trula Slade on 9/30) and orthopedics (Dr. Doreatha Martin) we elected to start anticoagulation with IV heparin-patient was monitored for a few days-and then subsequently transitioned to [place] on 10/3.  Since orthopedics planning on repeat I&D and wound closure-Xarelto has been stopped-patient has been transitioned back to IV heparin.  Once all surgical procedure complete-he will need to be transition back to Xarelto.    DKA: Resolved  Insulin-dependent DM-2: CBGs currently stable-continue NPH 28 units twice daily, 8 units of NovoLog with meals and SSI  HTN: BP stable-continue hydralazine.  Asthma: Stable without any evidence of bronchospasm.  Obesity: Estimated body mass index is 41.78 kg/m as calculated from the following:   Height as of this encounter: 6\' 6"  (1.981 m).   Weight as of this encounter: 164 kg.   Diet: Diet Order            Diet NPO time specified  Diet effective midnight        Diet Carb Modified Fluid consistency: Thin; Room service appropriate? Yes  Diet effective now               DVT Prophylaxis: IV heparin.  Code Status: Full code   Procedures: 9/4>> CT-guided aspiration of right paraspinal abscess at L4-L5 07/09/19>>  incision and drainage of the right hip/pelvic abscess along with neuroplasty of the right sciatic nerve by neurosurgery 9/9>>TEE 07/17/2019>>I&D of right gluteal abscess with drainage of 300 cc purulent fluid.   07/20/19>> Repeat irrigation and debridement right hip  and application of wound VAC  07/27/19>> incision and drainage of posterior right thigh abscess -wound VAC placement 9/28>> repeat irrigation and debridement of right thigh abscess, incisional wound VAC placement.  Family Communication: None  at bedside  Disposition Plan: Remain inpatient-suspect home later this week.  Antimicrobial agents: Anti-infectives (From admission, onward)   Start     Dose/Rate Route Frequency Ordered Stop   08/01/19 1600  amoxicillin (AMOXIL) capsule 500 mg     500 mg Oral  Once 08/01/19 1509 08/01/19 1607   08/01/19 1315  penicillin G in sodium chloride 10,000 units/mL syringe for skin test - 1st bleb     1,000 Units Intradermal  Once 08/01/19 1042 08/01/19 1535   08/01/19 1315  penicillin G in sodium chloride 10,000 units/mL syringe for skin test - 2nd bleb     1,000 Units Intradermal  Once 08/01/19 1042 08/01/19 1537   08/01/19 1300  penicillin G in sodium chloride 10,000 units/mL syringe for skin test     1,000 Units Topical  Once 08/01/19 1042 08/01/19 1535   07/17/19 1857  gentamicin (GARAMYCIN) injection  Status:  Discontinued       As needed 07/17/19 1857 07/17/19 2009   07/17/19 1857  vancomycin (VANCOCIN) powder  Status:  Discontinued       As needed 07/17/19 1858 07/17/19 2009   07/10/19 1600  metroNIDAZOLE (FLAGYL) IVPB 500 mg  Status:  Discontinued     500 mg 100 mL/hr over 60 Minutes Intravenous Every 8 hours 07/10/19 1549 07/13/19 0939   07/09/19 0839  vancomycin (VANCOCIN) powder  Status:  Discontinued       As needed 07/09/19 0839 07/09/19 0935   07/09/19 0839  tobramycin (NEBCIN) powder  Status:  Discontinued       As needed 07/09/19 0840 07/09/19 0935   07/07/19 1830  ceFAZolin (ANCEF) IVPB 2g/100 mL premix     2 g 200 mL/hr over 30 Minutes Intravenous Every 8 hours 07/07/19 1333     07/07/19 0900  ceFAZolin (ANCEF) IVPB 2g/100 mL premix  Status:  Discontinued     2 g 200 mL/hr over 30 Minutes Intravenous Every 8 hours 07/07/19 0847 07/07/19 1333   07/06/19 2030  clindamycin (CLEOCIN) IVPB 600 mg  Status:  Discontinued     600 mg 100 mL/hr over 30 Minutes Intravenous Every 8 hours 07/06/19 2010 07/07/19 0847   07/06/19 1800  vancomycin (VANCOCIN) 1,500 mg in sodium chloride  0.9 % 500 mL IVPB  Status:  Discontinued     1,500 mg 250 mL/hr over 120 Minutes Intravenous Every 12 hours 07/06/19 0820 07/07/19 0847   07/06/19 1400  ceFEPIme (MAXIPIME) 2 g in sodium chloride 0.9 % 100 mL IVPB  Status:  Discontinued     2 g 200 mL/hr over 30 Minutes Intravenous Every 8 hours 07/06/19 0504 07/07/19 0847   07/06/19 1400  vancomycin (VANCOCIN) 2,000 mg in sodium chloride 0.9 % 500 mL IVPB  Status:  Discontinued     2,000 mg 250 mL/hr over 120 Minutes Intravenous Every 8 hours 07/06/19 0504 07/06/19 0818   07/06/19 0745  clindamycin (CLEOCIN) IVPB 600 mg  Status:  Discontinued     600 mg 100 mL/hr over 30 Minutes Intravenous  Once 07/06/19 0737 07/06/19 2029   07/06/19 0500  ceFEPIme (MAXIPIME) 2 g in sodium chloride 0.9 % 100 mL IVPB     2 g 200 mL/hr over 30 Minutes Intravenous  Once 07/06/19 0451 07/06/19 1335   07/06/19 0500  vancomycin (VANCOCIN) 2,500 mg in sodium chloride 0.9 % 500 mL IVPB     2,500 mg 250 mL/hr over 120 Minutes Intravenous  Once 07/06/19 0451 07/06/19 1335      CONSULTS:  ID, orthopedic surgery and Neurosurgery  Time spent: 25- minutes-Greater than 50% of this time was spent in counseling, explanation of diagnosis, planning of further management, and coordination of care.  MEDICATIONS: Scheduled Meds:  sodium chloride   Intravenous Once   sodium chloride   Intravenous Once   acetaminophen  1,000 mg Oral TID   Chlorhexidine Gluconate Cloth  6 each Topical Daily   gabapentin  300 mg Oral TID   hydrALAZINE  25 mg Oral BID   influenza vac split quadrivalent PF  0.5 mL Intramuscular Tomorrow-1000   insulin aspart  0-15 Units Subcutaneous TID WC   insulin aspart  0-5 Units Subcutaneous QHS   insulin aspart  8 Units Subcutaneous TID WC   insulin NPH Human  28 Units Subcutaneous BID AC & HS   iron polysaccharides  150 mg Oral Daily   methocarbamol  750 mg Oral TID   metoCLOPramide  10 mg Oral TID AC & HS   polyethylene glycol   17 g Oral BID   senna-docusate  1 tablet Oral BID   sodium chloride flush  10-40 mL Intracatheter Q12H   Continuous Infusions:   ceFAZolin (ANCEF) IV 2 g (08/07/19 0959)   heparin     PRN Meds:.albuterol, diphenhydrAMINE, hydrALAZINE, HYDROmorphone, ondansetron **OR** ondansetron (ZOFRAN) IV, oxyCODONE, sodium chloride flush, zolpidem   PHYSICAL EXAM: Vital signs: Vitals:   08/06/19 0419 08/06/19 1255 08/06/19 2111 08/07/19 0508  BP: 119/74 125/77 109/61 114/73  Pulse: 97 96 96 98  Resp:  18 16 18   Temp: 98.7 F (37.1 C) 98.6 F (37 C) 98.9 F (37.2 C) 98.1 F (36.7 C)  TempSrc: Oral Oral Oral Oral  SpO2: 97% 97% 96% 99%  Weight:      Height:       Filed Weights   07/05/19 1807 07/06/19 2116 07/27/19 1113  Weight: (!) 167.4 kg (!) 164 kg (!) 164 kg   Body mass index is 41.78 kg/m.   Gen Exam:Alert awake-not in any distress HEENT:atraumatic, normocephalic Chest: B/L clear to auscultation anteriorly CVS:S1S2 regular Abdomen:soft non tender, non distended Extremities:no edema Neurology: Non focal Skin: no rash  I have personally reviewed following labs and imaging studies  LABORATORY DATA: CBC: Recent Labs  Lab 08/01/19 0547 08/02/19 0430 08/03/19 0711 08/04/19 0813 08/05/19 0337 08/07/19 0425  WBC 9.5 6.1 6.0 5.4 5.1 6.5  NEUTROABS 7.0  --   --   --   --   --   HGB 7.7* 7.6* 8.0* 7.4* 7.5* 7.6*  HCT 23.9* 24.7* 25.8* 24.4* 25.1* 25.8*  MCV 96.8 99.2 95.9 97.6 99.6 99.6  PLT 302 279 294 283 270 [id number]    Basic Metabolic Panel: Recent Labs  Lab 08/01/19 0547 08/02/19 0430 08/07/19 0425  NA 133* 135 136  K 3.5 3.4* 4.1  CL 95* 98 97*  CO2 29 28 28   GLUCOSE 92 136* 130*  BUN 5* <5* 7  CREATININE 0.61 0.65 0.59*  CALCIUM 8.1* 8.3* 8.9    GFR: Estimated Creatinine Clearance: 188.1 mL/min (A) (by C-G formula based on SCr of 0.59 mg/dL (L)).  Liver Function Tests: No results for input(s): AST, ALT, ALKPHOS, BILITOT, PROT, ALBUMIN in the last  168 hours. No results for input(s): LIPASE, AMYLASE in the last 168 hours. No  results for input(s): AMMONIA in the last 168 hours.  Coagulation Profile: No results for input(s): INR, PROTIME in the last 168 hours.  Cardiac Enzymes: No results for input(s): CKTOTAL, CKMB, CKMBINDEX, TROPONINI in the last 168 hours.  BNP (last 3 results) No results for input(s): PROBNP in the last 8760 hours.  HbA1C: No results for input(s): HGBA1C in the last 72 hours.  CBG: Recent Labs  Lab 08/06/19 1254 08/06/19 1657 08/06/19 2205 08/07/19 0801 08/07/19 1226  GLUCAP 136* 102* 127* 120* 120*    Lipid Profile: No results for input(s): CHOL, HDL, LDLCALC, TRIG, CHOLHDL, LDLDIRECT in the last 72 hours.  Thyroid Function Tests: No results for input(s): TSH, T4TOTAL, FREET4, T3FREE, THYROIDAB in the last 72 hours.  Anemia Panel: No results for input(s): VITAMINB12, FOLATE, FERRITIN, TIBC, IRON, RETICCTPCT in the last 72 hours.  Urine analysis:    Component Value Date/Time   COLORURINE YELLOW 07/06/2019 [place] 07/06/2019 0437   LABSPEC 1.024 07/06/2019 0437   PHURINE 5.0 07/06/2019 0437   GLUCOSEU >=500 (A) 07/06/2019 0437   HGBUR MODERATE (A) 07/06/2019 0437   BILIRUBINUR NEGATIVE 07/06/2019 0437   KETONESUR 80 (A) 07/06/2019 0437   PROTEINUR NEGATIVE 07/06/2019 0437   NITRITE NEGATIVE 07/06/2019 0437   LEUKOCYTESUR NEGATIVE 07/06/2019 0437    Sepsis Labs: Lactic Acid, Venous    Component Value Date/Time   LATICACIDVEN 2.6 (HH) 07/06/2019 2213    MICROBIOLOGY: No results found for this or any previous visit (from the past 240 hour(s)).  RADIOLOGY STUDIES/RESULTS: Dg Abd 1 View  Result Date: 07/15/2019 CLINICAL DATA:  Abdominal pain. EXAM: ABDOMEN - 1 VIEW COMPARISON:  07/12/2019 FINDINGS: Nondistended gas-filled loops of small bowel and colon noted. Nondistended gas-filled stomach is present. No suspicious calcifications are present. Stool in the colon is  present. IMPRESSION: Nonspecific nonobstructive bowel gas pattern with nondistended gas-filled stomach, small bowel and colon. Electronically Signed   By: Margarette Canada M.D.   On: 07/15/2019 17:02   Dg Abd 1 View  Result Date: 07/12/2019 CLINICAL DATA:  NG tube placement EXAM: ABDOMEN - 1 VIEW COMPARISON:  None. FINDINGS: Nasogastric tube tip and side port project over the stomach. Lung bases are clear. No dilated bowel is visible. IMPRESSION: NG tube tip and side port project over the stomach. Electronically Signed   By: Ulyses Jarred M.D.   On: 07/12/2019 00:33   Dg Abd 1 View  Result Date: 07/11/2019 CLINICAL DATA:  Nausea and vomiting with abdominal pain EXAM: ABDOMEN - 1 VIEW COMPARISON:  07/06/2019 FINDINGS: Marked gaseous dilatation of the stomach. Otherwise nonobstructed gas pattern with gas in the small and large bowel. Findings could be secondary to enteritis or mild ileus. IMPRESSION: 1. Incompletely visualized marked gaseous dilatation of stomach, consider NG tube decompression. Outlet obstruction could be considered. 2. Remainder of the gas pattern shows air-filled small and large bowel in a nonobstructed pattern, question enteritis or mild ileus. Electronically Signed   By: Donavan Foil M.D.   On: 07/11/2019 21:58   Ct Angio Chest Pe W Or Wo Contrast  Result Date: 08/01/2019 CLINICAL DATA:  48 year old male with syncope. EXAM: CT ANGIOGRAPHY CHEST WITH CONTRAST TECHNIQUE: Multidetector CT imaging of the chest was performed using the standard protocol during bolus administration of intravenous contrast. Multiplanar CT image reconstructions and MIPs were obtained to evaluate the vascular anatomy. CONTRAST:  163mL OMNIPAQUE IOHEXOL 350 MG/ML SOLN COMPARISON:  None. FINDINGS: Cardiovascular: Borderline cardiomegaly. No pericardial effusion. Multi vessel coronary vascular calcification.  The thoracic aorta is unremarkable. Evaluation of the pulmonary arteries is somewhat limited due to suboptimal  opacification and visualization of the peripheral branches and respiratory motion artifact. No large or central pulmonary artery embolus identified. Right sided PICC with tip close to the cavoatrial junction noted. Mediastinum/Nodes: There is no hilar or mediastinal adenopathy. The esophagus and the thyroid gland are grossly unremarkable. No mediastinal fluid collection. Lungs/Pleura: Bibasilar linear atelectasis/scarring. No lobar consolidation, pleural effusion pneumothorax. The central airways are patent. Upper Abdomen: Multiple gallstones. The visualized upper abdomen is otherwise unremarkable. Musculoskeletal: No chest wall abnormality. No acute or significant osseous findings. Review of the MIP images confirms the above findings. IMPRESSION: 1. No acute intrathoracic pathology. No CT evidence of central pulmonary artery embolus. 2. Cholelithiasis. Aortic Atherosclerosis (ICD10-I70.0). Electronically Signed   By: Anner Crete M.D.   On: 08/01/2019 20:55   Ct Hip Right Wo Contrast  Result Date: 07/30/2019 CLINICAL DATA:  Right hip abscess. EXAM: CT OF THE RIGHT HIP WITHOUT CONTRAST TECHNIQUE: Multidetector CT imaging of the right hip was performed according to the standard protocol. Multiplanar CT image reconstructions were also generated. COMPARISON:  None. FINDINGS: Bones/Joint/Cartilage Widening of the right sacroiliac joint with erosive changes most concerning for septic arthritis. Nondisplaced fracture of the inferior aspect of the right sacrum adjacent to the sacroiliac joint. Subchondral fracture involving the right ilium along the right SI joint. Small amount air along the anterior margin of the right sacroiliac joint within the iliopsoas muscle. No acute fracture or dislocation.  Normal alignment. Ligaments Ligaments are suboptimally evaluated by CT. Soft tissue, Muscles and Tendons Soft tissue wound overlying the right greater trochanter with air within the subcutaneous fat. There has been  significant interval improvement in the complex fluid collection involving the right gluteal musculature extending into the pelvis. Small amount air in the left semimembranosus muscle partially visualized. No fluid collection or hematoma.  No soft tissue mass. IMPRESSION: 1. 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 Interval development of a subchondral fracture involving the right ilium and a nondisplaced fracture along the inferior aspect of the right sacrum. 2. Soft tissue wound overlying the right greater trochanter with air within the subcutaneous fat likely postsurgical. Significant interval improvement in the complex fluid collection involving the right gluteal musculature extending into the pelvis. 3. Small amount air in the left semimembranosus muscle partially visualized most concerning for persistent infection in the absence recent instrumentation involving this area. Electronically Signed   By: Kathreen Devoid   On: 07/30/2019 16:56   Mr Tibia Fibula Right W Wo Contrast  Result Date: 07/26/2019 CLINICAL DATA:  drainage posteromedial R knee, eval for abscess. Contacted by nurse regarding new drainage from posteromedial proximal lower R leg. Started this am Pt notes pain only when touching areaNo other concerns elsewhere Currently on ancef for MSSA from R gluteal/hip abscess EXAM: MRI OF LOWER RIGHT EXTREMITY WITHOUT AND WITH CONTRAST TECHNIQUE: Multiplanar, multisequence MR imaging of the right lower extremity was performed both before and after administration of intravenous contrast. CONTRAST:  74mL GADAVIST GADOBUTROL 1 MMOL/ML IV SOLN COMPARISON:  None. FINDINGS: Soft tissues: Large complex fluid collection extends from the thigh across the posterior knee. It begins above the included field of view and thus is entire extent is not imaged on this exam. The collection terminates in the posterosuperior calf, below the knee. There is peripheral wall enhancement and septal enhancement  throughout the collection. Small foci of signal void are noted within the fluid collection consistent  with bubbles of air. Transversely, the collection measures a maximum of 11 x 9 cm. The collection is at least 27 cm from superior to inferior, but extends above the included field of view and therefore is longer from superior to inferior. The collection is centered on the hamstrings. The neurovascular structures are displaced anteriorly. There is diffuse surrounding soft tissue edema/cellulitis. Osseous structures: Normal signal. No evidence of edema or osteomyelitis. Right knee joint is normally spaced and aligned. No joint effusion. Visualized tendons are intact, normal in signal. IMPRESSION: 1. Large complex collection consistent with an abscess extending throughout the entire visible posterior thigh, centered on the posterior compartment involving the hamstring musculature. The collection measures 11 x 9 cm transversely. It extends through the visualized thigh across the knee to the upper leg. There is surrounding soft tissue edema/cellulitis. 2. No evidence of osteomyelitis. Knee joint is unremarkable with no effusion to suggest septic arthritis. Electronically Signed   By: Lajean Manes M.D.   On: 07/26/2019 21:17   Vas [place] Lower Extremity Venous (dvt)  Result Date: 08/01/2019  Lower Venous Study Indications: Follow up dvt.  Limitations: Bandages and open wound. Comparison Study: previous study done 9/25 Performing Technologist: Abram Sander RVS  Examination Guidelines: A complete evaluation includes B-mode imaging, spectral Doppler, color Doppler, and power Doppler as needed of all accessible portions of each vessel. Bilateral testing is considered an integral part of a complete examination. Limited examinations for reoccurring indications may be performed as noted.  +---------+---------------+---------+-----------+----------+-----------------+  RIGHT      Compressibility Phasicity Spontaneity Properties Thrombus Aging     +---------+---------------+---------+-----------+----------+-----------------+  CFV       Full            Yes       Yes                                       +---------+---------------+---------+-----------+----------+-----------------+  SFJ       Full                                                                +---------+---------------+---------+-----------+----------+-----------------+  FV Prox   Full                                                                +---------+---------------+---------+-----------+----------+-----------------+  FV Mid    Full                                                                +---------+---------------+---------+-----------+----------+-----------------+  FV Distal Full                                                                +---------+---------------+---------+-----------+----------+-----------------+  POP                                                        Not visualized     +---------+---------------+---------+-----------+----------+-----------------+  PTV       None                                             Age Indeterminate  +---------+---------------+---------+-----------+----------+-----------------+  PERO                                                       Not visualized     +---------+---------------+---------+-----------+----------+-----------------+   +----+---------------+---------+-----------+----------+--------------+  LEFT Compressibility Phasicity Spontaneity Properties Thrombus Aging  +----+---------------+---------+-----------+----------+--------------+  CFV  Full            Yes       Yes                                    +----+---------------+---------+-----------+----------+--------------+     Summary: Right: Findings consistent with age indeterminate deep vein thrombosis involving the right posterior tibial veins. No cystic structure found in the popliteal fossa.  Left: No evidence of common femoral vein obstruction.  *See table(s) above for measurements and observations. Electronically signed by Harold Barban MD on 08/01/2019 at 4:08:15 PM.    Final    Vas [place] Lower Extremity Venous (dvt)  Result Date: 07/28/2019  Lower Venous Study Indications: Edema, abcess right proximal calf, and Pain.  Comparison Study: 07/11/19 RLE negative Performing Technologist: June Leap RDMS, RVT  Examination Guidelines: A complete evaluation includes B-mode imaging, spectral Doppler, color Doppler, and power Doppler as needed of all accessible portions of each vessel. Bilateral testing is considered an integral part of a complete examination. Limited examinations for reoccurring indications may be performed as noted.  +---------+---------------+---------+-----------+----------+--------------+  RIGHT     Compressibility Phasicity Spontaneity Properties Thrombus Aging  +---------+---------------+---------+-----------+----------+--------------+  CFV       Full            Yes       Yes                                    +---------+---------------+---------+-----------+----------+--------------+  SFJ       Full                                                             +---------+---------------+---------+-----------+----------+--------------+  FV Prox   Full                                                             +---------+---------------+---------+-----------+----------+--------------+  FV Mid    Full                                                             +---------+---------------+---------+-----------+----------+--------------+  FV Distal Full                                                             +---------+---------------+---------+-----------+----------+--------------+  PFV       Full                                                             +---------+---------------+---------+-----------+----------+--------------+  POP                                                         Not visualized  +---------+---------------+---------+-----------+----------+--------------+  PTV       None                                             Acute           +---------+---------------+---------+-----------+----------+--------------+  PERO      Full                                                             +---------+---------------+---------+-----------+----------+--------------+   +----+---------------+---------+-----------+----------+--------------+  LEFT Compressibility Phasicity Spontaneity Properties                 +----+---------------+---------+-----------+----------+--------------+  CFV                                                   Not visualized  +----+---------------+---------+-----------+----------+--------------+     Summary: Right: Findings consistent with acute deep vein thrombosis involving the right posterior tibial veins.  *See table(s) above for measurements and observations. Electronically signed by Curt Jews MD on 07/28/2019 at 4:04:40 AM.    Final    [place] Ekg Site Rite  Result Date: 07/14/2019 If Site Rite image not attached, placement could not be confirmed due to current cardiac rhythm.    LOS: 32 days   Oren Binet, MD  [facility]  If 7PM-7AM, please contact night-coverage  Please page via [email or web address]  Go to [email or web address] and use [facility] universal password to access. If you do not have the password, please contact the hospital operator.  Locate  the [facility] provider you are looking for under [facility] and page to a number that you can be directly reached. If you still have difficulty reaching the provider, please page the [facility] (Director on Call) for the Hospitalists listed on amion for assistance.  08/07/2019, 1:14 PM

## 2019-08-07 NOTE — Progress Notes (Signed)
[place] for Heparin  Indication: DVT  Allergies  Allergen Reactions  . Tramadol Anaphylaxis    Patient Measurements: Height: 6\' 6"  (198.1 cm) Weight: (!) 361 lb 8.9 oz (164 kg) IBW/kg (Calculated) : 91.4 Heparin Dosing Weight: 128 kg  Vital Signs: Temp: 98.3 F (36.8 C) (10/06 2016) Temp Source: Oral (10/06 2016) BP: 116/65 (10/06 2016) Pulse Rate: 101 (10/06 2016)  Labs: Recent Labs    08/05/19 0337 08/07/19 0425 08/07/19 2055  HGB 7.5* 7.6*  --   HCT 25.1* 25.8*  --   PLT 270 259  --   APTT  --   --  72*  CREATININE  --  0.59*  --     Estimated Creatinine Clearance: 188.1 mL/min (A) (by C-G formula based on SCr of 0.59 mg/dL (L)).  Assessment: 48 yo male with Right LE DVT. H/O anemia requiring blood transfusion. Patient receiving rivaroxaban but now needing further surgery on 10/7. MD wishes to bridge with heparin drip pending surgery. Last dose of rivarovaban was 2212 on 10/5. Will start heparin at 1000 today.   Hgb remains low-stable. Platelets are within normal limits and stable. No bleeding noted.   Will have to use APTT to guide therapy due to effect of rivaroxaban on HL. Shoot for lower end of therapeutic range per MD for anemia.   Evening aptt at goal (72s), no bleeding noted.  Goal of Therapy:  Heparin level 0.3-0.5 APTT 66-85 sec Monitor platelets by anticoagulation protocol: Yes   Plan:  Continue heparin drip at 2600 units/hr Daily HL and APTT Monitor for bleeding.   Erin Hearing PharmD., BCPS Clinical Pharmacist 08/07/2019 9:47 PM

## 2019-08-07 NOTE — Progress Notes (Signed)
PT Cancellation Note  Patient Details Name: Bradley Cox MRN: [id number] DOB: Jul 29, 1971   Cancelled Treatment:    Reason Eval/Treat Not Completed: Other (comment) per ortho note this morning, patient ok to resume anticoagulants. Per medication history section, patient has not received anticoagulant this morning yet. Will follow and attempt therapy once patient is within therapeutic window of anticoagulant medication (due to R LE DVT) if time/schedule allow.    Deniece Ree PT, DPT, CBIS  Supplemental Physical Therapist [facility]    Pager [phone] Acute Rehab Office [phone]

## 2019-08-08 ENCOUNTER — Inpatient Hospital Stay (HOSPITAL_COMMUNITY): Payer: Self-pay | Admitting: Certified Registered"

## 2019-08-08 ENCOUNTER — Encounter (HOSPITAL_COMMUNITY): Payer: Self-pay | Admitting: *Deleted

## 2019-08-08 ENCOUNTER — Encounter (HOSPITAL_COMMUNITY)
Admission: EM | Disposition: A | Discharge status: Rehabilitation Facility | Payer: Self-pay | Source: Home / Self Care | Attending: Family Medicine

## 2019-08-08 HISTORY — PX: I & D EXTREMITY: SHX5045

## 2019-08-08 LAB — GLUCOSE, CAPILLARY
Glucose-Capillary: 99 mg/dL (ref 70–99)
Glucose-Capillary: 97 mg/dL (ref 70–99)
Glucose-Capillary: 111 mg/dL — ABNORMAL HIGH (ref 70–99)
Glucose-Capillary: 133 mg/dL — ABNORMAL HIGH (ref 70–99)
Glucose-Capillary: 171 mg/dL — ABNORMAL HIGH (ref 70–99)
Glucose-Capillary: 217 mg/dL — ABNORMAL HIGH (ref 70–99)

## 2019-08-08 LAB — SURGICAL PCR SCREEN
MRSA, PCR: NEGATIVE
Staphylococcus aureus: NEGATIVE

## 2019-08-08 LAB — TYPE AND SCREEN
ABO/RH(D): A POS
Antibody Screen: NEGATIVE

## 2019-08-08 LAB — CBC
HCT: 25.1 % — ABNORMAL LOW (ref 39.0–52.0)
Hemoglobin: 7.3 g/dL — ABNORMAL LOW (ref 13.0–17.0)
MCH: 29 pg (ref 26.0–34.0)
MCHC: 29.1 g/dL — ABNORMAL LOW (ref 30.0–36.0)
MCV: 99.6 fL (ref 80.0–100.0)
Platelets: 258 10*3/uL (ref 150–400)
RBC: 2.52 MIL/uL — ABNORMAL LOW (ref 4.22–5.81)
RDW: 15.5 % (ref 11.5–15.5)
WBC: 5.2 10*3/uL (ref 4.0–10.5)
nRBC: 0.4 % — ABNORMAL HIGH (ref 0.0–0.2)

## 2019-08-08 LAB — HEPARIN LEVEL (UNFRACTIONATED): Heparin Unfractionated: 0.37 IU/mL (ref 0.30–0.70)

## 2019-08-08 LAB — APTT: aPTT: 78 seconds — ABNORMAL HIGH (ref 24–36)

## 2019-08-08 SURGERY — IRRIGATION AND DEBRIDEMENT EXTREMITY
Anesthesia: General | Site: Leg Upper | Laterality: Right

## 2019-08-08 MED ORDER — MIDAZOLAM HCL 2 MG/2ML IJ SOLN: 0.5000 mg | Freq: Once | INTRAMUSCULAR | Status: DC | PRN

## 2019-08-08 MED ORDER — HYDROMORPHONE HCL 1 MG/ML IJ SOLN
INTRAMUSCULAR | Status: AC
  Filled 2019-08-08: qty 2

## 2019-08-08 MED ORDER — MEPERIDINE HCL 25 MG/ML IJ SOLN: 6.2500 mg | INTRAMUSCULAR | Status: DC | PRN

## 2019-08-08 MED ORDER — FENTANYL CITRATE (PF) 250 MCG/5ML IJ SOLN
INTRAMUSCULAR | Status: AC
  Filled 2019-08-08: qty 5

## 2019-08-08 MED ORDER — PHENYLEPHRINE 40 MCG/ML (10ML) SYRINGE FOR IV PUSH (FOR BLOOD PRESSURE SUPPORT)
PREFILLED_SYRINGE | INTRAVENOUS | Status: AC
  Filled 2019-08-08: qty 10

## 2019-08-08 MED ORDER — ENSURE MAX PROTEIN PO LIQD
11.0000 [oz_av] | Freq: Three times a day (TID) | ORAL | Status: DC
  Administered 2019-08-09 – 2019-08-16 (×23): 11 [oz_av] via ORAL
  Filled 2019-08-08 (×24): qty 330

## 2019-08-08 MED ORDER — GLYCOPYRROLATE PF 0.2 MG/ML IJ SOSY
PREFILLED_SYRINGE | INTRAMUSCULAR | Status: AC
  Filled 2019-08-08: qty 1

## 2019-08-08 MED ORDER — FENTANYL CITRATE (PF) 100 MCG/2ML IJ SOLN
INTRAMUSCULAR | Status: DC | PRN
  Administered 2019-08-08: 100 ug via INTRAVENOUS
  Administered 2019-08-08 (×2): 50 ug via INTRAVENOUS
  Administered 2019-08-08: 100 ug via INTRAVENOUS
  Administered 2019-08-08 (×2): 50 ug via INTRAVENOUS

## 2019-08-08 MED ORDER — ROCURONIUM BROMIDE 10 MG/ML (PF) SYRINGE
PREFILLED_SYRINGE | INTRAVENOUS | Status: AC
  Filled 2019-08-08: qty 10

## 2019-08-08 MED ORDER — 0.9 % SODIUM CHLORIDE (POUR BTL) OPTIME
TOPICAL | Status: DC | PRN
  Administered 2019-08-08: 1000 mL

## 2019-08-08 MED ORDER — RIVAROXABAN 15 MG PO TABS
15.0000 mg | ORAL_TABLET | Freq: Two times a day (BID) | ORAL | Status: DC
  Administered 2019-08-08 – 2019-08-16 (×16): 15 mg via ORAL
  Filled 2019-08-08 (×16): qty 1

## 2019-08-08 MED ORDER — SODIUM CHLORIDE 0.9 % IR SOLN
Status: DC | PRN
  Administered 2019-08-08: 3000 mL

## 2019-08-08 MED ORDER — BACITRACIN ZINC 500 UNIT/GM EX OINT
TOPICAL_OINTMENT | CUTANEOUS | Status: AC
  Filled 2019-08-08: qty 28.35

## 2019-08-08 MED ORDER — TOBRAMYCIN SULFATE 1.2 G IJ SOLR
INTRAMUSCULAR | Status: AC
  Filled 2019-08-08: qty 1.2

## 2019-08-08 MED ORDER — MIDAZOLAM HCL 2 MG/2ML IJ SOLN
INTRAMUSCULAR | Status: AC
  Filled 2019-08-08: qty 2

## 2019-08-08 MED ORDER — GLYCOPYRROLATE PF 0.2 MG/ML IJ SOSY
PREFILLED_SYRINGE | INTRAMUSCULAR | Status: DC | PRN
  Administered 2019-08-08: .1 mg via INTRAVENOUS

## 2019-08-08 MED ORDER — LIDOCAINE 2% (20 MG/ML) 5 ML SYRINGE
INTRAMUSCULAR | Status: AC
  Filled 2019-08-08: qty 5

## 2019-08-08 MED ORDER — RIVAROXABAN 20 MG PO TABS: 20.0000 mg | ORAL_TABLET | Freq: Every day | ORAL | Status: DC

## 2019-08-08 MED ORDER — ROCURONIUM BROMIDE 50 MG/5ML IV SOSY
PREFILLED_SYRINGE | INTRAVENOUS | Status: DC | PRN
  Administered 2019-08-08: 60 mg via INTRAVENOUS

## 2019-08-08 MED ORDER — PROPOFOL 10 MG/ML IV BOLUS
INTRAVENOUS | Status: DC | PRN
  Administered 2019-08-08: 200 mg via INTRAVENOUS

## 2019-08-08 MED ORDER — VANCOMYCIN HCL 1000 MG IV SOLR
INTRAVENOUS | Status: AC
  Filled 2019-08-08: qty 1000

## 2019-08-08 MED ORDER — DEXAMETHASONE SODIUM PHOSPHATE 10 MG/ML IJ SOLN
INTRAMUSCULAR | Status: DC | PRN
  Administered 2019-08-08: 4 mg via INTRAVENOUS

## 2019-08-08 MED ORDER — ONDANSETRON HCL 4 MG/2ML IJ SOLN
INTRAMUSCULAR | Status: DC | PRN
  Administered 2019-08-08: 4 mg via INTRAVENOUS

## 2019-08-08 MED ORDER — MIDAZOLAM HCL 5 MG/5ML IJ SOLN
INTRAMUSCULAR | Status: DC | PRN
  Administered 2019-08-08: 2 mg via INTRAVENOUS

## 2019-08-08 MED ORDER — SUCCINYLCHOLINE CHLORIDE 200 MG/10ML IV SOSY
PREFILLED_SYRINGE | INTRAVENOUS | Status: AC
  Filled 2019-08-08: qty 10

## 2019-08-08 MED ORDER — SUGAMMADEX SODIUM 200 MG/2ML IV SOLN
INTRAVENOUS | Status: DC | PRN
  Administered 2019-08-08: 400 mg via INTRAVENOUS

## 2019-08-08 MED ORDER — ONDANSETRON HCL 4 MG/2ML IJ SOLN
INTRAMUSCULAR | Status: AC
  Filled 2019-08-08: qty 2

## 2019-08-08 MED ORDER — LACTATED RINGERS IV SOLN: INTRAVENOUS | Status: DC

## 2019-08-08 MED ORDER — PROMETHAZINE HCL 25 MG/ML IJ SOLN: 6.2500 mg | INTRAMUSCULAR | Status: DC | PRN

## 2019-08-08 MED ORDER — SODIUM CHLORIDE 0.9% IV SOLUTION: Freq: Once | INTRAVENOUS | Status: DC

## 2019-08-08 MED ORDER — HYDROMORPHONE HCL 1 MG/ML IJ SOLN
0.2500 mg | INTRAMUSCULAR | Status: DC | PRN
  Administered 2019-08-08 (×3): 0.5 mg via INTRAVENOUS

## 2019-08-08 MED ORDER — DEXAMETHASONE SODIUM PHOSPHATE 10 MG/ML IJ SOLN
INTRAMUSCULAR | Status: AC
  Filled 2019-08-08: qty 1

## 2019-08-08 MED ORDER — LACTATED RINGERS IV SOLN
INTRAVENOUS | Status: DC | PRN
  Administered 2019-08-08: 11:00:00 via INTRAVENOUS

## 2019-08-08 MED ORDER — PROPOFOL 10 MG/ML IV BOLUS
INTRAVENOUS | Status: AC
  Filled 2019-08-08: qty 20

## 2019-08-08 MED ORDER — ADULT MULTIVITAMIN W/MINERALS CH
1.0000 | ORAL_TABLET | Freq: Every day | ORAL | Status: DC
  Administered 2019-08-09 – 2019-08-16 (×8): 1 via ORAL
  Filled 2019-08-08 (×8): qty 1

## 2019-08-08 SURGICAL SUPPLY — 49 items
BNDG COHESIVE 4X5 TAN STRL (GAUZE/BANDAGES/DRESSINGS) ×2 IMPLANT
BNDG GAUZE ELAST 4 BULKY (GAUZE/BANDAGES/DRESSINGS) ×4 IMPLANT
BRUSH SCRUB EZ PLAIN DRY (MISCELLANEOUS) ×4 IMPLANT
CHLORAPREP W/TINT 26 (MISCELLANEOUS) ×2 IMPLANT
COVER MAYO STAND STRL (DRAPES) ×2 IMPLANT
COVER SURGICAL LIGHT HANDLE (MISCELLANEOUS) ×3 IMPLANT
COVER WAND RF STERILE (DRAPES) ×1 IMPLANT
DRAPE LAPAROTOMY T 98X78 PEDS (DRAPES) ×1 IMPLANT
DRAPE ORTHO SPLIT 77X108 STRL (DRAPES) ×1
DRAPE SURG 17X23 STRL (DRAPES) ×2 IMPLANT
DRAPE SURG ORHT 6 SPLT 77X108 (DRAPES) ×1 IMPLANT
DRAPE U-SHAPE 47X51 STRL (DRAPES) ×2 IMPLANT
DRSG ADAPTIC 3X8 NADH LF (GAUZE/BANDAGES/DRESSINGS) ×2 IMPLANT
DRSG AQUACEL AG ADV 3.5X14 (GAUZE/BANDAGES/DRESSINGS) ×1 IMPLANT
DRSG TEGADERM 4X4.75 (GAUZE/BANDAGES/DRESSINGS) ×1 IMPLANT
ELECT REM PT RETURN 9FT ADLT (ELECTROSURGICAL)
ELECTRODE REM PT RTRN 9FT ADLT (ELECTROSURGICAL) IMPLANT
EVACUATOR 1/8 PVC DRAIN (DRAIN) ×1 IMPLANT
GAUZE SPONGE 4X4 12PLY STRL (GAUZE/BANDAGES/DRESSINGS) ×2 IMPLANT
GLOVE BIO SURGEON STRL SZ 6.5 (GLOVE) ×6 IMPLANT
GLOVE BIO SURGEON STRL SZ7.5 (GLOVE) ×8 IMPLANT
GLOVE BIOGEL PI IND STRL 6.5 (GLOVE) ×1 IMPLANT
GLOVE BIOGEL PI IND STRL 7.5 (GLOVE) ×1 IMPLANT
GLOVE BIOGEL PI INDICATOR 6.5 (GLOVE) ×1
GLOVE BIOGEL PI INDICATOR 7.5 (GLOVE) ×1
GOWN STRL REUS W/ TWL LRG LVL3 (GOWN DISPOSABLE) ×2 IMPLANT
GOWN STRL REUS W/TWL LRG LVL3 (GOWN DISPOSABLE) ×2
HANDPIECE INTERPULSE COAX TIP (DISPOSABLE)
KIT BASIN OR (CUSTOM PROCEDURE TRAY) ×2 IMPLANT
KIT TURNOVER KIT B (KITS) ×2 IMPLANT
MANIFOLD NEPTUNE II (INSTRUMENTS) ×2 IMPLANT
NS IRRIG 1000ML POUR BTL (IV SOLUTION) ×2 IMPLANT
PACK GENERAL/GYN (CUSTOM PROCEDURE TRAY) ×1 IMPLANT
PACK ORTHO EXTREMITY (CUSTOM PROCEDURE TRAY) ×1 IMPLANT
PAD ARMBOARD 7.5X6 YLW CONV (MISCELLANEOUS) ×4 IMPLANT
PADDING CAST COTTON 6X4 STRL (CAST SUPPLIES) ×2 IMPLANT
SET HNDPC FAN SPRY TIP SCT (DISPOSABLE) IMPLANT
SPONGE LAP 18X18 RF (DISPOSABLE) ×2 IMPLANT
SUT ETHILON 2 0 FS 18 (SUTURE) ×4 IMPLANT
SUT ETHILON 2 0 PSLX (SUTURE) ×2 IMPLANT
SUT ETHILON 3 0 PS 1 (SUTURE) ×4 IMPLANT
SUT MON AB 2-0 CT1 36 (SUTURE) ×2 IMPLANT
SUT PDS AB 0 CT 36 (SUTURE) IMPLANT
SWAB CULTURE ESWAB REG 1ML (MISCELLANEOUS) IMPLANT
TOWEL GREEN STERILE (TOWEL DISPOSABLE) ×4 IMPLANT
TOWEL GREEN STERILE FF (TOWEL DISPOSABLE) ×2 IMPLANT
TUBE CONNECTING 12X1/4 (SUCTIONS) ×2 IMPLANT
WATER STERILE IRR 1000ML POUR (IV SOLUTION) ×2 IMPLANT
YANKAUER SUCT BULB TIP NO VENT (SUCTIONS) ×2 IMPLANT

## 2019-08-08 NOTE — Progress Notes (Addendum)
Paged Dr. Karleen Hampshire regarding Heparin stop time prior to surgery. Awaiting response.  [phone] Paged Bradley Cox for time to stop Heparin.

## 2019-08-08 NOTE — Anesthesia Procedure Notes (Signed)
Procedure Name: Intubation Date/Time: 08/08/2019 11:48 AM Performed by: Orlie Dakin, CRNA Pre-anesthesia Checklist: Patient identified, Emergency Drugs available, Suction available and Patient being monitored Patient Re-evaluated:Patient Re-evaluated prior to induction Oxygen Delivery Method: Circle system utilized Preoxygenation: Pre-oxygenation with 100% oxygen Induction Type: IV induction Ventilation: Mask ventilation without difficulty Laryngoscope Size: Mac and 4 Grade View: Grade I Tube type: Oral Tube size: 7.5 mm Number of attempts: 1 Airway Equipment and Method: Stylet Placement Confirmation: ETT inserted through vocal cords under direct vision,  positive ETCO2 and breath sounds checked- equal and bilateral Secured at: 23 cm Tube secured with: Tape Dental Injury: Teeth and Oropharynx as per pre-operative assessment  Comments: 4x4s bite block used.

## 2019-08-08 NOTE — Progress Notes (Signed)
PROGRESS NOTE    Bradley Cox  [id number] DOB: [birth year] DOA: 07/05/2019 PCP: Simona Huh, NP   Brief Narrative:   48 year old gentleman with history of diabetes, essential hypertension, asthma presents with fever was found to have sepsis from MSSA infection with L3-S1 paraspinal abscess, right iliopsoas and right posterior thigh abscesses.  He underwent serial debridement by orthopedics.  Infectious disease consulted and recommended to continue with Ancef till 08/27/2019.  Hospital course complicated by right lower extremity DVT and he was started on Xarelto.  He also underwent repeat I&D on 10/7.   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  Patient is afebrile and he had undergone serial debridements of the right thigh abscess by orthopedics.  He also underwent CT-guided drainage of the paraspinal abscess by IR. Infectious disease consulted and recommended to continue with IV Ancef till 08/27/2019.  As patient developed significant output from the incisional wound VAC orthopedics did a repeat incision and irrigation and drainage and wound closure on 08/08/2019 Pain control and physical therapy evaluation.   Anemia of chronic disease/anemia acute blood loss from repeated irrigation and drainage  Transfuse to keep hemoglobin greater than 7.  Patient underwent 2 units of PRBC transfusion so far.    DVT of the right lower extremities secondary to limited ambulation.  Patient required 2 units of PRBC  transfusion already and he was started on Xarelto on 08/04/2019.    DKA Resolved    Essential hypertension blood pressure parameters better Continue with hydralazine.   Asthma No wheezing heard    Obesity Outpatient follow-up with PCP for diet and exercise.  Hypokalemia replaced Diabetes mellitus  CBG (last 3)  Recent Labs    08/08/19 1306 08/08/19 1527 08/08/19 1659  GLUCAP 111* 133* 171*   Resume sliding scale insulin/     DVT prophylaxis: Xarelto code Status: Full code Family Communication: Family at bedside Disposition Plan: Pending further evaluation and management. Consultants:   Orthopedics  Procedures: Irrigation and debridement of right thigh abscess with closure on 08/08/2019 by Dr. Doreatha Martin Antimicrobials:cefazolin  Subjective: Pain is not well controlled requiring IV Dilaudid postop  patient denied nausea or vomiting or abdominal pain  Objective: Vitals:   08/08/19 1404 08/08/19 1415 08/08/19 1429 08/08/19 1514  BP: 119/77  131/81 124/77  Pulse: 98  97 96  Resp: 15  14 14   Temp:  98.7 F (37.1 C) 98.8 F (37.1 C) 98.9 F (37.2 C)  TempSrc:   Oral Oral  SpO2: (!) 88%  93% 96%  Weight:      Height:        Intake/Output Summary (Last 24 hours) at 08/08/2019 1733 Last data filed at 08/08/2019 1649 Gross per 24 hour  Intake 2031.03 ml  Output 2925 ml  Net -893.97 ml   Filed Weights   07/05/19 1807 07/06/19 2116 07/27/19 1113  Weight: (!) 167.4 kg (!) 164 kg (!) 164 kg    Examination:  General exam: Appears calm and comfortable  Respiratory system: Clear to  auscultation. Respiratory effort normal. Cardiovascular system: S1 & S2 heard, RRR. No JVD, murmurs, rubs, gallops or clicks. No pedal edema. Gastrointestinal system: Abdomen is nondistended, soft and nontender. No organomegaly or masses felt. Normal bowel sounds heard. Central nervous system: Alert and oriented. No focal neurological deficits. Extremities: right lower  extremity  area bandaged and connected to the drain.  Skin: No rashes, lesions or ulcers Psychiatry: Judgement and insight appear normal. Mood & affect appropriate.     Data Reviewed: I have personally reviewed following labs and imaging studies  CBC: Recent Labs  Lab 08/03/19 0711 08/04/19 0813 08/05/19 0337 08/07/19 0425 08/08/19 0407  WBC 6.0 5.4 5.1 6.5 5.2  HGB 8.0* 7.4* 7.5* 7.6* 7.3*  HCT 25.8* 24.4* 25.1* 25.8* 25.1*  MCV 95.9 97.6 99.6 99.6 99.6  PLT 294 283 270 259 0000000   Basic Metabolic Panel: Recent Labs  Lab 08/02/19 0430 08/07/19 0425  NA 135 136  K 3.4* 4.1  CL 98 97*  CO2 28 28  GLUCOSE 136* 130*  BUN <5* 7  CREATININE 0.65 0.59*  CALCIUM 8.3* 8.9   GFR: Estimated Creatinine Clearance: 188.1 mL/min (A) (by C-G formula based on SCr of 0.59 mg/dL (L)). Liver Function Tests: No results for input(s): AST, ALT, ALKPHOS, BILITOT, PROT, ALBUMIN in the last 168 hours. No results for input(s): LIPASE, AMYLASE in the last 168 hours. No results for input(s): AMMONIA in the last 168 hours. Coagulation Profile: No results for input(s): INR, PROTIME in the last 168 hours. Cardiac Enzymes: No results for input(s): CKTOTAL, CKMB, CKMBINDEX, TROPONINI in the last 168 hours. BNP (last 3 results) No results for input(s): PROBNP in the last 8760 hours. HbA1C: No results for input(s): HGBA1C in the last 72 hours. CBG: Recent Labs  Lab 08/08/19 0812 08/08/19 0958 08/08/19 1306 08/08/19 1527 08/08/19 1659  GLUCAP 99 97 111* 133* 171*   Lipid Profile: No results for input(s): CHOL, HDL, LDLCALC, TRIG, CHOLHDL, LDLDIRECT in the last 72 hours. Thyroid Function Tests: No results for input(s): TSH, T4TOTAL, FREET4, T3FREE, THYROIDAB in the last 72 hours. Anemia Panel: No results for input(s): VITAMINB12, FOLATE, FERRITIN, TIBC, IRON, RETICCTPCT in the last 72 hours. Sepsis Labs: No results for input(s): PROCALCITON, LATICACIDVEN in the last 168 hours.  Recent  Results (from the past 240 hour(s))  Surgical pcr screen     Status: None   Collection Time: 08/08/19  9:22 AM   Specimen: Nasal Mucosa; Nasal Swab  Result Value Ref Range Status   MRSA, PCR NEGATIVE NEGATIVE Final   Staphylococcus aureus NEGATIVE NEGATIVE Final    Comment: (NOTE) The Xpert SA Assay (FDA approved for NASAL specimens in patients 22 years of age and older), is one component of a comprehensive surveillance program. It is not intended to diagnose infection nor to guide or monitor treatment. Performed at [facility], [place] [address], [place], [place] [address]          Radiology Studies: No results found.      Scheduled Meds: . sodium chloride   Intravenous Once  . sodium chloride   Intravenous Once  . sodium chloride   Intravenous Once  . acetaminophen  1,000 mg Oral TID  . Chlorhexidine Gluconate Cloth  6 each Topical Daily  . gabapentin  300 mg Oral TID  . hydrALAZINE  25 mg Oral BID  . HYDROmorphone      . influenza vac split quadrivalent PF  0.5 mL Intramuscular Tomorrow-1000  . insulin aspart  0-15 Units Subcutaneous TID WC  . insulin aspart  0-5 Units Subcutaneous QHS  . insulin aspart  8 Units Subcutaneous TID WC  . insulin NPH Human  28 Units Subcutaneous BID AC & HS  . iron polysaccharides  150 mg Oral Daily  . methocarbamol  750 mg Oral TID  . metoCLOPramide  10 mg Oral TID AC & HS  . [START ON 08/09/2019] multivitamin with minerals  1 tablet Oral Daily  . polyethylene glycol  17 g Oral BID  . [START ON 08/09/2019] Ensure Max Protein  11 oz Oral TID BM  . rivaroxaban  15 mg Oral BID   Followed by  . [START ON 08/25/2019] rivaroxaban  20 mg Oral Q supper  . senna-docusate  1 tablet Oral BID  . sodium chloride flush  10-40 mL Intracatheter Q12H   Continuous Infusions: .  ceFAZolin (ANCEF) IV 2 g (08/08/19 1002)     LOS: 33 days       Hosie Poisson, MD [facility] Pager [phone]  If 7PM-7AM, please contact  night-coverage [email or web address] Password [facility] 08/08/2019, 5:33 PM

## 2019-08-08 NOTE — Anesthesia Preprocedure Evaluation (Addendum)
Anesthesia Evaluation  Patient identified by MRN, date of birth, ID band Patient awake    Reviewed: Allergy & Precautions, NPO status , Patient's Chart, lab work & pertinent test results  History of Anesthesia Complications Negative for: history of anesthetic complications  Airway Mallampati: II  TM Distance: >3 FB Neck ROM: Full    Dental  (+) Dental Advisory Given   Pulmonary sleep apnea (does not use CPAP) ,    breath sounds clear to auscultation       Cardiovascular hypertension, Pt. on medications (-) angina Rhythm:Regular Rate:Normal  07/11/2019 ECHO: EF 60-65%, mod LVH, valves OK   Neuro/Psych negative neurological ROS     GI/Hepatic Neg liver ROS, GERD  Controlled,  Endo/Other  diabetes (on insulin since hospitalized)Morbid obesityGlu 97  Renal/GU Renal InsufficiencyRenal disease     Musculoskeletal  (+) Arthritis ,   Abdominal (+) + obese,   Peds  Hematology  (+) Blood dyscrasia (Hb 7.3), anemia ,   Anesthesia Other Findings   Reproductive/Obstetrics                            Anesthesia Physical Anesthesia Plan  ASA: III  Anesthesia Plan: General   Post-op Pain Management:    Induction: Intravenous  PONV Risk Score and Plan: 2 and Ondansetron and Dexamethasone  Airway Management Planned: Oral ETT  Additional Equipment:   Intra-op Plan:   Post-operative Plan: Extubation in OR  Informed Consent: I have reviewed the patients History and Physical, chart, labs and discussed the procedure including the risks, benefits and alternatives for the proposed anesthesia with the patient or authorized representative who has indicated his/her understanding and acceptance.     Dental advisory given  Plan Discussed with: CRNA and Surgeon  Anesthesia Plan Comments:        Anesthesia Quick Evaluation

## 2019-08-08 NOTE — Progress Notes (Addendum)
Initial Nutrition Assessment  RD working remotely.  DOCUMENTATION CODES:   Morbid obesity  INTERVENTION:   Once diet is advanced, add:  -MVI with minerals daily -Ensure Max po TID, each supplement provides 150 kcal and 30 grams of protein -Double protein portions with meals  -Provided "Carbohydrate Counting for People with Diabetes" handout from [facility] Nutrition Care Manual; attached to AVS/ discharge summary  NUTRITION DIAGNOSIS:   Increased nutrient needs related to post-op healing as evidenced by estimated needs.  GOAL:   Patient will meet greater than or equal to 90% of their needs  MONITOR:   PO intake, Supplement acceptance, Diet advancement, Labs, Weight trends, Skin, I & O's  REASON FOR ASSESSMENT:   LOS    ASSESSMENT:   Bradley Cox is a 48 y.o. male with medical history significant of hypertension, diabetes mellitus type 2, asthma, and arthritis; who presents with complaints of right flank pain.  History is obtained from the patient and his mother who is present at bedside.  Patient reports symptoms started approximately 3 weeks ago after initially being seen in urgent care for kidney stones.  He was given a shot of Toradol in his right buttock on a steroid Dosepak.  However, instead of symptoms getting better and they got worse.  He reports swelling and pain in right flank radiating down his leg.  He has been unable to walk due to symptoms.  Reports that his diabetes is currently managed with metformin and he has not been on insulin.  He does not know what his last hemoglobin A1c was.  Pt admitted with sepsis secondary to rt hip abscess.   9/4- s/p CT aspiration of right paraspinal abscess (15 ml brown fluid removed) 9/9- s/p Procedures: Incision and drainage of right hip/pelvis abscess and Neuroplasty of right sciatic nerve  9/9- s/p TEE- low suspicion for endocarditis 9/9- NGT placed due to nausea and vomiting 9/10- NGT d/c 9/12- PICC placed 9/15- s/p  Procedure(s):INCISION AND DRAINAGE OF RIGHT DEEP ABSCESS HIP (Right); INSERTION OF ANTIBIOTIC BEADS VANC AND GENT; Application Of Wound Vac (Right) 9/18- s/p Procedure(s): Irrigation And Debridement Hip (Right) with sharp excision of subcutaneous fat and deep fascia; Application Of Wound Vac (Right) 9/25- s/p Procedures: IIncision and drainage of right posterior thigh abscess and Wound vac placement  9/28- s/p Procedures: Irrigation and debridement of right thigh abscess and Incisional wound vac placement  Reviewed I/O's: -3.5 L x 24 hours and -45.2 3 L since 07/25/19  UOP: 4.6 L x 24 hours  Drain output: 300 ml x 24 hours  Pt down in OR for repeat I&D and wound closure of rt leg. Per orthopedics notes, he has had significant wound vac output.   Pt with good appetite; noted meal completion 100%. Pt with increased nutritional needs related to wound healing and post-operative healing. He would benefit from addition of nutritional supplements.   Wt has been stable since admission.  Pt is uninsured and prefers to return home with home health and IV antibiotics at discharge.   Lab Results  Component Value Date   HGBA1C 12.6 (H) 07/07/2019   PTA DM medications are 500 mg metformin BID. Pt with uncontrolled DM and was unaware of diagnosis PTA.   Labs reviewed: CBGS: 97-137 (inpatient orders for glycemic control are 0-15 units insulin aspart TID with meals, 0-5 units insulin aspart q HS, 8 units insulin aspart TID with meals, and 28 units insulin NPH BID).   Diet Order:   Diet Order  Diet NPO time specified  Diet effective midnight              EDUCATION NEEDS:   Not appropriate for education at this time  Skin:  Skin Assessment: Skin Integrity Issues: Skin Integrity Issues:: Wound VAC, Incisions Wound Vac: rt leg, rt lower back Incisions: rt hip  Last BM:  08/05/19  Height:   Ht Readings from Last 1 Encounters:  07/27/19 6\' 6"  (1.981 m)    Weight:   Wt Readings  from Last 1 Encounters:  07/27/19 (!) 164 kg    Ideal Body Weight:  97.3 kg  BMI:  Body mass index is 41.78 kg/m.  Estimated Nutritional Needs:   Kcal:  2400-2700  Protein:  160-190 grams  Fluid:  2.4 L    Berit Raczkowski A. Jimmye Norman, RD, LDN, [place] Registered Dietitian II Certified Diabetes Care and Education Specialist Pager: [phone] After hours Pager: [phone]

## 2019-08-08 NOTE — Progress Notes (Signed)
[place] for Heparin >>Xarelto Indication: DVT  Allergies  Allergen Reactions  . Tramadol Anaphylaxis    Patient Measurements: Height: 6\' 6"  (198.1 cm) Weight: (!) 361 lb 8.9 oz (164 kg) IBW/kg (Calculated) : 91.4 Heparin Dosing Weight: 128 kg  Vital Signs: Temp: 98.8 F (37.1 C) (10/07 1429) Temp Source: Oral (10/07 1429) BP: 131/81 (10/07 1429) Pulse Rate: 97 (10/07 1429)  Labs: Recent Labs    08/07/19 0425 08/07/19 2055 08/08/19 0407  HGB 7.6*  --  7.3*  HCT 25.8*  --  25.1*  PLT 259  --  258  APTT  --  72* 78*  HEPARINUNFRC  --   --  0.37  CREATININE 0.59*  --   --     Estimated Creatinine Clearance: 188.1 mL/min (A) (by C-G formula based on SCr of 0.59 mg/dL (L)).  Assessment: 48 yo male with Right LE DVT. H/O anemia requiring blood transfusion. Patient receiving rivaroxaban but now needing further surgery on 10/7. MD wishes to bridge with heparin drip pending surgery. Last dose of rivarovaban was 2212 on 10/5.   Hgb remains low-stable. Platelets are within normal limits and stable. No bleeding noted.    Patient post-op. D/W Dr. Doreatha Martin and Karleen Hampshire. Will restart xarelto therapy tonight after 2000.    Goal of Therapy:  Monitor platelets by anticoagulation protocol: Yes   Plan:  D/c heparin drip Restart xarelto 15mg  BID until 10/23, then 20mg  daily starting on 10/24 Monitor for bleeding.   Aleria Maheu A. Levada Dy, PharmD, BCPS, FNKF Clinical Pharmacist [place] Please utilize Amion for appropriate phone number to reach the unit pharmacist ([place])   08/08/2019 2:57 PM

## 2019-08-08 NOTE — Interval H&P Note (Signed)
History and Physical Interval Note:  08/08/2019 10:58 AM  Bradley Cox  has presented today for surgery, with the diagnosis of Right thigh abscess.  The various methods of treatment have been discussed with the patient and family. After consideration of risks, benefits and other options for treatment, the patient has consented to  Procedure(s): IRRIGATION AND DEBRIDEMENT EXTREMITY (Right) as a surgical intervention.  The patient's history has been reviewed, patient examined, no change in status, stable for surgery.  I have reviewed the patient's chart and labs.  Questions were answered to the patient's satisfaction.     Lennette Bihari P Sally-Anne Wamble

## 2019-08-08 NOTE — Progress Notes (Signed)
OT Cancellation Note  Patient Details Name: Bradley Cox MRN: [id number] DOB: 07-25-1971   Cancelled Treatment:    Reason Eval/Treat Not Completed: Patient at procedure or test/ unavailable;Other (comment) Will check back as time allows.   [place], [place] [facility] [place] 08/08/2019, 10:10 AM

## 2019-08-08 NOTE — Op Note (Signed)
Orthopaedic Surgery Operative Note (CSN: [id number] ) Date of Surgery: 08/08/2019  Admit Date: 07/05/2019   Diagnoses: Pre-Op Diagnoses: Right thigh abscess with continued drainage   Post-Op Diagnosis: Same  Procedures: CPT 27301-Irrigation and debridement of right thigh abscess with closure  Surgeons : Primary: Shona Needles, MD  Assistant: Patrecia Pace, PA-C  Location: OR 5   Anesthesia:General  Antibiotics: Ancef 2g schedule   Tourniquet time:None    Estimated Blood Loss: Minimal  Complications:* No complications entered in OR log *   Specimens:* No specimens in log *   Implants: * No implants in log *   Indications for Surgery: 48 yo male with large posterior thigh abscess s/p I&D twice. Incisional wound vac has been in place and continues to have significant output. Recommended returning to OR to perform another I&D and closure. Risks and benefits discussed and he agrees to proceed.  Operative Findings: No significant purulence. Repeat irrigation and debridement with closure over medium hemovac drain.  Procedure: The patient was identified in the preoperative holding area. Consent was confirmed with the patient and their family and all questions were answered. The operative extremity was marked after confirmation with the patient. he was then brought back to the operating room by our anesthesia colleagues. Placed under general anesthesia and then placed prone. All bony prominences were padded. The operative extremity was then prepped and draped in usual sterile fashion. A preoperative timeout was performed to verify the patient, the procedure, and the extremity.  Wound was opened. No gross purulence. I performed irrigation with cystotubing. Nearly 3 liters was used. Tissue appeared healthy and viable. Medium hemovac was placed deep to muscle and brought superficial. Brought through the skin. Layered closure was performed with 2-0 monocryl and 2-0 nylon. Drain was sewn  in placed. Sterile aquacel dressing placed. Patient placed supine, awoken from anesthesia and taken to PACU in stable condition.  Post Op Plan/Instructions: Patient will be WBAT RLE. Drain to remain in place. Okay to discharge tomorrow vs Friday from orthopaedic perspective. Okay to restart anticoagulation tonight.  I was present and performed the entire surgery.  Patrecia Pace, PA-C did assist me throughout the case. An assistant was necessary given the difficulty in approach, maintenance of reduction and ability to instrument the fracture.   Katha Hamming, MD [facility]

## 2019-08-08 NOTE — Transfer of Care (Signed)
Immediate Anesthesia Transfer of Care Note  Patient: Bradley Cox  Procedure(s) Performed: IRRIGATION AND DEBRIDEMENT RIGHT EXTREMITY (Right Leg Upper)  Patient Location: PACU  Anesthesia Type:General  Level of Consciousness: awake and patient cooperative  Airway & Oxygen Therapy: Patient Spontanous Breathing and Patient connected to face mask oxygen  Post-op Assessment: Report given to RN and Post -op Vital signs reviewed and stable  Post vital signs: Reviewed and stable  Last Vitals:  Vitals Value Taken Time  BP    Temp    Pulse    Resp    SpO2      Last Pain:  Vitals:   08/08/19 0703  TempSrc:   PainSc: 7       Patients Stated Pain Goal: 3 ([id number])  Complications: No apparent anesthesia complications

## 2019-08-08 NOTE — Discharge Instructions (Addendum)
Glucose Products:  ReliOn glucose products raise low blood sugar fast. Tablets are free of fat, caffeine, sodium and gluten. They are portable and easy to carry, making it easier for people with diabetes to BE PREPARED for lows.  Glucose Tablets Available in 6 flavors  10 ct...................................... $1.00  50 ct...................................... $3.98 Glucose Shot..................................$1.48 Glucose Gel....................................$3.44  Alcohol Swabs Alcohol swabs are used to sterilize your injection site. All of our swabs are individually wrapped for maximum safety, convenience and moisture retention. ReliOn Alcohol Swabs  100 ct Swabs..............................$1.00  400 ct Swabs..............................$3.74  Lancets ReliOn offers three lancet options conveniently designed to work with almost every lancing device. Each features a protective disk, which guarantees sterility before testing. ReliOn Lancets  100 ct Lancets $1.56  200 ct Lancets $2.64 Available in Ultra-Thin, Thin & Micro-Thin ReliOn 2-IN-1 Lancing Device  50 ct Lancets..................................... $3.44 Available in 30 gauge and 25 gauge ReliOn Lancing Device....................$5.84  Blood Glucose Monitors ReliOn offers a full range of blood glucose testing options to provide an accurate, affordable system that meets each person's unique needs and preferences. Prime Meter....................................... $9.00 Prime Test Strips  25 test strips.................................... $5.00  50 test strips.................................... $9.00  100 test strips.................................$17.88 Premier [employer] Meter    $18.98 Premier Voice Meter  .  $14.98 Premier Test Strips  50 test strips.................................... $9.00  100 test strips.................................$17.88 Premier [employer] Kit     $19.44 Kit includes:  50 test strips  10 lancets  Lancing device  Carry case  Ketone Test Strips  50 test strips  ..  $6.64  Human Insulin  Novolin/ReliOn (recombinant DNA origin) is manufactured for [employer] by [employer] Insulin* with Vial..........$24.88 Available in N, R, 70/30 Novolin/ReliOn Insulin Pens*    $42.88 Available in N, R, 70/30  Insulin Delivery ReliOn syringes and pen needles provide precision technology, comfort and accuracy in insulin delivery at affordable prices. ReliOn Pen Needles*  50 ct....................................................$9.00 Available in 69m, 6638m 38m76m 63m43mliOn Insulin Syringes*  100 ct . $12.58 Available in 29G, 30G & 31G (3/10cc, 1/2cc & 1cc units) Information on my medicine - XARELTO (rivaroxaban)  This medication education was reviewed with me or my healthcare representative as part of my discharge preparation.   WHY WAS XARELTO PRESCRIBED FOR YOU? Xarelto was prescribed to treat blood clots that may have been found in the veins of your legs (deep vein thrombosis) or in your lungs (pulmonary embolism) and to reduce the risk of them occurring again.  What do you need to know about Xarelto? The starting dose is one 15 mg tablet taken TWICE daily with food for the FIRST 21 DAYS then on (enter date)  10/24  the dose is changed to one 20 mg tablet taken ONCE A DAY with your evening meal.  DO NOT stop taking Xarelto without talking to the health care provider who prescribed the medication.  Refill your prescription for 20 mg tablets before you run out.  After discharge, you should have regular check-up appointments with your healthcare provider that is prescribing your Xarelto.  In the future your dose may need to be changed if your kidney function changes by a significant amount.  What do you do if you miss a dose? If you are taking Xarelto TWICE  DAILY and you miss a dose, take it as soon as you remember. You may take two 15 mg tablets (total 30 mg) at the same time then resume your regularly scheduled 15 mg twice daily the next day.  If you are taking Xarelto ONCE DAILY and you miss a dose, take it as soon as you remember on the same day then continue your regularly scheduled once daily regimen the next day. Do not take two doses of Xarelto at the same time.   Important Safety Information Xarelto is a blood thinner medicine that can cause bleeding. You should call your healthcare provider right away if you experience any of the following: ? Bleeding from an injury or your nose that does not stop. ? Unusual colored urine (red or dark brown) or unusual colored stools (red or black). ? Unusual bruising for unknown reasons. ? A serious fall or if you hit your head (even if there is no bleeding).  Some medicines may interact with Xarelto and might increase your risk of bleeding while on Xarelto. To help avoid this, consult your healthcare provider or pharmacist prior to using any new prescription or non-prescription medications, including herbals, vitamins, non-steroidal anti-inflammatory drugs (NSAIDs) and supplements.  This website has more information on Xarelto: [email or web address]   Carbohydrate Counting For People With Diabetes  Why Is Carbohydrate Counting Important?  Counting carbohydrate servings may help you control your blood glucose level so that you feel better.   The balance between the carbohydrates you eat and insulin determines what your blood glucose level will be after eating.   Carbohydrate counting can also help you plan your meals. Which Foods Have Carbohydrates? Foods with carbohydrates include:  Breads, crackers, and cereals   Pasta, rice, and grains   Starchy vegetables, such as potatoes, corn, and peas   Beans and legumes   Milk, soy milk, and yogurt   Fruits and fruit juices   Sweets, such as  cakes, cookies, ice cream, jam, and jelly Carbohydrate Servings In diabetes meal planning, 1 serving of a food with carbohydrate has about 15 grams of carbohydrate:  Check serving sizes with measuring cups and spoons or a food scale.   Read the Nutrition Facts on food labels to find out how many grams of carbohydrate are in foods you eat. The food lists in this handout show portions that have about 15 grams of carbohydrate.  Tips Meal Planning Tips  An Eating Plan tells you how many carbohydrate servings to eat at your meals and snacks. For many adults, eating 3 to 5 servings of carbohydrate foods at each meal and 1 or 2 carbohydrate servings for each snack works well.   In a healthy daily Eating Plan, most carbohydrates come from:   At least 6 servings of fruits and nonstarchy vegetables   At least 6 servings of grains, beans, and starchy vegetables, with at least 3 servings from whole grains   At least 2 servings of milk or milk products  Check your blood glucose level regularly. It can tell you if you need to adjust when you eat carbohydrates.   Eating foods that have fiber, such as whole grains, and having very few salty foods is good for your health.   Eat 4 to 6 ounces of meat or other protein foods (such as soybean burgers) each day. Choose low-fat sources of protein, such as lean beef, lean pork, chicken, fish, low-fat cheese, or vegetarian foods such as soy.   Eat some healthy fats, such as olive oil, canola oil, and nuts.   Eat very little saturated fats. These unhealthy fats are found in butter, cream, and high-fat meats, such as bacon and sausage.   Eat very little or no trans fats. These  unhealthy fats are found in all foods that list partially hydrogenated oil as an ingredient.  Label Reading Tips The Nutrition Facts panel on a label lists the grams of total carbohydrate in 1 standard serving. The label's standard serving may be larger or smaller than 1 carbohydrate  serving. To figure out how many carbohydrate servings are in the food:  First, look at the label's standard serving size.   Check the grams of total carbohydrate. This is the amount of carbohydrate in 1 standard serving.   Divide the grams of total carbohydrate by 15. This number equals the number of carbohydrate servings in 1 standard serving. Remember: 1 carbohydrate serving is 15 grams of carbohydrate.   Note: You may ignore the grams of sugars on the Nutrition Facts panel because they are included in the grams of total carbohydrate.  Foods Recommended 1 serving = about 15 grams of carbohydrate Starches  1 slice bread (1 ounce)   1 tortilla (6-inch size)    large bagel (1 ounce)   2 taco shells (5-inch size)    hamburger or hot dog bun ( ounce)    cup ready-to-eat unsweetened cereal    cup cooked cereal   1 cup broth-based soup   4 to 6 small crackers   1/3 cup pasta or rice (cooked)    cup beans, peas, corn, sweet potatoes, winter squash, or mashed or boiled potatoes (cooked)    large baked potato (3 ounces)    ounce pretzels, potato chips, or tortilla chips   3 cups popcorn (popped) Fruit  1 small fresh fruit ( to 1 cup)    cup canned or frozen fruit   2 tablespoons dried fruit (blueberries, cherries, cranberries, mixed fruit, raisins)   17 small grapes (3 ounces)   1 cup melon or berries    cup unsweetened fruit juice Milk  1 cup fat-free or reduced-fat milk   1 cup soy milk   2/3 cup (6 ounces) nonfat yogurt sweetened with sugar-free sweetener Sweets and Desserts  2-inch square cake (unfrosted)   2 small cookies (2/3 ounce)    cup ice cream or frozen yogurt    cup sherbet or sorbet   1 tablespoon syrup, jam, jelly, table sugar, or honey   2 tablespoons light syrup Other Foods  Count 1 cup raw vegetables or  cup cooked nonstarchy vegetables as zero (0) carbohydrate servings or free foods. If you eat 3 or more servings  at one meal, count them as 1 carbohydrate serving.   Foods that have less than 20 calories in each serving also may be counted as zero carbohydrate servings or free foods.   Count 1 cup of casserole or other mixed foods as 2 carbohydrate servings.  Carbohydrate Counting for People with Diabetes Sample 1-Day Menu  Breakfast 1 extra-small banana (1 carbohydrate serving)  1 cup low-fat or fat-free milk (1 carbohydrate serving)  1 slice whole wheat bread (1 carbohydrate serving)  1 teaspoon margarine  Lunch 2 ounces [place] slices  2 slices whole wheat bread (2 carbohydrate servings)  2 lettuce leaves  4 celery sticks  4 carrot sticks  1 medium apple (1 carbohydrate serving)  1 cup low-fat or fat-free milk (1 carbohydrate serving)  Afternoon Snack 2 tablespoons raisins (1 carbohydrate serving)  3/4 ounce unsalted mini pretzels (1 carbohydrate serving)  Evening Meal 3 ounces lean roast beef  1/2 large baked potato (2 carbohydrate servings)  1 tablespoon reduced-fat sour cream  1/2 cup green beans  1 tablespoon light salad dressing  1 whole wheat dinner roll (1 carbohydrate serving)  1 teaspoon margarine  1 cup melon balls (1 carbohydrate serving)  Evening Snack 2 tablespoons unsalted nuts   Carbohydrate Counting for People with Diabetes Vegan Sample 1-Day Menu  Breakfast 1 cup cooked oatmeal (2 carbohydrate servings)   cup blueberries (1 carbohydrate serving)  2 tablespoons flaxseeds  1 cup soymilk fortified with calcium and vitamin D  1 cup coffee  Lunch 2 slices whole wheat bread (2 carbohydrate servings)   cup baked tofu   cup lettuce  2 slices tomato  2 slices avocado   cup baby carrots  1 orange (1 carbohydrate serving)  1 cup soymilk fortified with calcium and vitamin D   Evening Meal Burrito made with: 1 6-inch corn tortilla (1 carbohydrate serving)  1 cup refried vegetarian beans (1 carbohydrate serving)   cup chopped tomatoes   cup lettuce   cup salsa  1/3  cup brown rice (1 carbohydrate serving)  1 tablespoon olive oil for rice   cup zucchini   Evening Snack 6 small whole grain crackers (1 carbohydrate serving)  2 apricots ( carbohydrate serving)   cup unsalted peanuts ( carbohydrate serving)     Carbohydrate Counting for People with Diabetes Vegetarian (Lacto-Ovo) Sample 1-Day Menu  Breakfast 1 cup cooked oatmeal (2 carbohydrate servings)   cup blueberries (1 carbohydrate serving)  2 tablespoons flaxseeds  1 egg  1 cup 1% milk (1 carbohydrate serving)  1 cup coffee  Lunch 2 slices whole wheat bread (2 carbohydrate servings)  2 ounces low-fat cheese   cup lettuce  2 slices tomato  2 slices avocado   cup baby carrots  1 orange (1 carbohydrate serving)  1 cup unsweetened tea  Evening Meal Burrito made with: 1 6-inch corn tortilla (1 carbohydrate serving)   cup refried vegetarian beans (1 carbohydrate serving)   cup tomatoes   cup lettuce   cup salsa  1/3 cup brown rice (1 carbohydrate serving)  1 tablespoon olive oil for rice   cup zucchini  1 cup 1% milk (1 carbohydrate serving)  Evening Snack 6 small whole grain crackers (1 carbohydrate serving)  2 apricots ( carbohydrate serving)   cup unsalted peanuts ( carbohydrate serving)    Copyright 2020  Academy of Nutrition and Dietetics. All rights reserved.  Using Nutrition Labels: Carbohydrate   Serving Size   Look at the serving size. All the information on the label is based on this portion.  Servings Per Container   The number of servings contained in the package.  Guidelines for Carbohydrate   Look at the total grams of carbohydrate in the serving size.   1 carbohydrate choice = 15 grams of carbohydrate. Range of Carbohydrate Grams Per Choice  Carbohydrate Grams/Choice Carbohydrate Choices  6-10   11-20 1  21-25 1  26-35 2  36-40 2  41-50 3  51-55 3  56-65 4  66-70 4  71-80 5    Copyright 2020  Academy of Nutrition and Dietetics.  All rights reserved.     Orthopaedic Trauma Service Discharge Instructions   General Discharge Instructions  WEIGHT BEARING STATUS: Weightbearing as tolerated right leg  RANGE OF MOTION/ACTIVITY: Okay for unrestricted knee and hip range of motion  Wound Care: It is normal for these incisions to continue to drain some clear fluid over the next week or so. Incisions can be left open to air if there is no drainage. If incision continues to  have drainage, follow wound care instructions below. Okay to shower if no drainage from incisions.  DVT/PE prophylaxis: Xarelto   Diet: as you were eating previously.  Can use over the counter stool softeners and bowel preparations, such as Miralax, to help with bowel movements.  Narcotics can be constipating.  Be sure to drink plenty of fluids  PAIN MEDICATION USE AND EXPECTATIONS  You have likely been given narcotic medications to help control your pain.  After a traumatic event that results in an fracture (broken bone) with or without surgery, it is ok to use narcotic pain medications to help control one's pain.  We understand that everyone responds to pain differently and each individual patient will be evaluated on a regular basis for the continued need for narcotic medications. Ideally, narcotic medication use should last no more than 6-8 weeks (coinciding with fracture healing).   As a patient it is your responsibility as well to monitor narcotic medication use and report the amount and frequency you use these medications when you come to your office visit.   We would also advise that if you are using narcotic medications, you should take a dose prior to therapy to maximize you participation.  IF YOU ARE ON NARCOTIC MEDICATIONS IT IS NOT PERMISSIBLE TO OPERATE A MOTOR VEHICLE (MOTORCYCLE/CAR/TRUCK/MOPED) OR HEAVY MACHINERY DO NOT MIX NARCOTICS WITH OTHER CNS (CENTRAL NERVOUS SYSTEM) DEPRESSANTS SUCH AS ALCOHOL   STOP SMOKING OR USING NICOTINE  PRODUCTS!!!!  As discussed nicotine severely impairs your body's ability to heal surgical and traumatic wounds but also impairs bone healing.  Wounds and bone heal by forming microscopic blood vessels (angiogenesis) and nicotine is a vasoconstrictor (essentially, shrinks blood vessels).  Therefore, if vasoconstriction occurs to these microscopic blood vessels they essentially disappear and are unable to deliver necessary nutrients to the healing tissue.  This is one modifiable factor that you can do to dramatically increase your chances of healing your injury.    (This means no smoking, no nicotine gum, patches, etc)     ICE AND ELEVATE INJURED/OPERATIVE EXTREMITY  Using ice and elevating the injured extremity above your heart can help with swelling and pain control.  Icing in a pulsatile fashion, such as 20 minutes on and 20 minutes off, can be followed.    Do not place ice directly on skin. Make sure there is a barrier between to skin and the ice pack.    Using frozen items such as frozen peas works well as the conform nicely to the are that needs to be iced.  USE AN ACE WRAP OR TED HOSE FOR SWELLING CONTROL  In addition to icing and elevation, Ace wraps or TED hose are used to help limit and resolve swelling.  It is recommended to use Ace wraps or TED hose until you are informed to stop.    When using Ace Wraps start the wrapping distally (farthest away from the body) and wrap proximally (closer to the body)   Example: If you had surgery on your leg or thing and you do not have a splint on, start the ace wrap at the toes and work your way up to the thigh        If you had surgery on your upper extremity and do not have a splint on, start the ace wrap at your fingers and work your way up to the upper arm   [place]: [place] ADDITIONAL INFORMATION:  [email or web address]     Discharge Wound Care Instructions  Do NOT apply any  ointments, solutions or lotions to pin sites or surgical wounds.  These prevent needed drainage and even though solutions like hydrogen peroxide kill bacteria, they also damage cells lining the pin sites that help fight infection.  Applying lotions or ointments can keep the wounds moist and can cause them to breakdown and open up as well. This can increase the risk for infection. When in doubt call the office.  Surgical incisions should be dressed daily.  If any drainage is noted, use one layer of adaptic, then gauze, Kerlix, and an ace wrap.  Once the incision is completely dry and without drainage, it may be left open to air out.  Showering may begin 36-48 hours later.  Cleaning gently with soap and water.  Traumatic wounds should be dressed daily as well.    One layer of adaptic, gauze, Kerlix, then ace wrap.  The adaptic can be discontinued once the draining has ceased    If you have a wet to dry dressing: wet the gauze with saline the squeeze as much saline out so the gauze is moist (not soaking wet), place moistened gauze over wound, then place a dry gauze over the moist one, followed by Kerlix wrap, then ace wrap.

## 2019-08-08 NOTE — Progress Notes (Signed)
[place] for Heparin  Indication: DVT  Allergies  Allergen Reactions  . Tramadol Anaphylaxis    Patient Measurements: Height: 6\' 6"  (198.1 cm) Weight: (!) 361 lb 8.9 oz (164 kg) IBW/kg (Calculated) : 91.4 Heparin Dosing Weight: 128 kg  Vital Signs: Temp: 98.7 F (37.1 C) (10/07 0509) Temp Source: Oral (10/07 0509) BP: 113/64 (10/07 0512) Pulse Rate: 93 (10/07 0512)  Labs: Recent Labs    08/07/19 0425 08/07/19 2055 08/08/19 0407  HGB 7.6*  --  7.3*  HCT 25.8*  --  25.1*  PLT 259  --  258  APTT  --  72* 78*  HEPARINUNFRC  --   --  0.37  CREATININE 0.59*  --   --     Estimated Creatinine Clearance: 188.1 mL/min (A) (by C-G formula based on SCr of 0.59 mg/dL (L)).  Assessment: 48 yo male with Right LE DVT. H/O anemia requiring blood transfusion. Patient receiving rivaroxaban but now needing further surgery on 10/7. MD wishes to bridge with heparin drip pending surgery. Last dose of rivarovaban was 2212 on 10/5.   Hgb remains low-stable. Platelets are within normal limits and stable. No bleeding noted.  Shoot for lower end of therapeutic range per MD for anemia.   AM PTT 78 and HL 0.37 which correlate. Will convert to using heparin levels. Patient to go for surgery today at 1030. RN to ask OR when to turn off heparin prior to surgery.   Goal of Therapy:  Heparin level 0.3-0.5 APTT 66-85 sec Monitor platelets by anticoagulation protocol: Yes   Plan:  Continue heparin drip at 2600 units/hr F/u restart post-op Daily HL Monitor for bleeding.   Romaine Neville A. Levada Dy, PharmD, BCPS, FNKF Clinical Pharmacist [place] Please utilize Amion for appropriate phone number to reach the unit pharmacist ([place])   08/08/2019 7:47 AM

## 2019-08-09 ENCOUNTER — Encounter (HOSPITAL_COMMUNITY): Payer: Self-pay | Admitting: Student

## 2019-08-09 ENCOUNTER — Ambulatory Visit: Payer: Self-pay | Admitting: Neurology

## 2019-08-09 LAB — GLUCOSE, CAPILLARY
Glucose-Capillary: 210 mg/dL — ABNORMAL HIGH (ref 70–99)
Glucose-Capillary: 157 mg/dL — ABNORMAL HIGH (ref 70–99)
Glucose-Capillary: 155 mg/dL — ABNORMAL HIGH (ref 70–99)
Glucose-Capillary: 148 mg/dL — ABNORMAL HIGH (ref 70–99)

## 2019-08-09 LAB — BASIC METABOLIC PANEL
Anion gap: 8 (ref 5–15)
BUN: 12 mg/dL (ref 6–20)
CO2: 29 mmol/L (ref 22–32)
Calcium: 8.4 mg/dL — ABNORMAL LOW (ref 8.9–10.3)
Chloride: 96 mmol/L — ABNORMAL LOW (ref 98–111)
Creatinine, Ser: 0.56 mg/dL — ABNORMAL LOW (ref 0.61–1.24)
GFR calc Af Amer: >60 mL/min (ref >60)
GFR calc non Af Amer: >60 mL/min (ref >60)
Glucose, Bld: 162 mg/dL — ABNORMAL HIGH (ref 70–99)
Potassium: 3.6 mmol/L (ref 3.5–5.1)
Sodium: 133 mmol/L — ABNORMAL LOW (ref 135–145)

## 2019-08-09 LAB — HEMOGLOBIN AND HEMATOCRIT, BLOOD
HCT: 24.9 % — ABNORMAL LOW (ref 39.0–52.0)
Hemoglobin: 7.8 g/dL — ABNORMAL LOW (ref 13.0–17.0)

## 2019-08-09 MED ORDER — HYDROMORPHONE HCL 1 MG/ML IJ SOLN
1.0000 mg | INTRAMUSCULAR | Status: DC | PRN
  Administered 2019-08-09 – 2019-08-12 (×16): 1 mg via INTRAVENOUS
  Filled 2019-08-09 (×16): qty 1

## 2019-08-09 NOTE — Progress Notes (Signed)
Inpatient Rehabilitation Admissions Coordinator  Asked by therapy and RN CM, Debbie to reconsider patient for a possible inpatient rehab admission. We have consulted numerous times since 07/11/2019. He is min guard assist level which due to his medical issues , a CIR admit will not change. He has 24/7 assist at home and I recommend Home with [facility] at this time. Not a candidate for an inpt rehab admission at this level.  Danne Baxter, RN, MSN Rehab Admissions Coordinator [phone] 08/09/2019 5:05 PM

## 2019-08-09 NOTE — Anesthesia Postprocedure Evaluation (Signed)
Anesthesia Post Note  Patient: Bradley Cox  Procedure(s) Performed: IRRIGATION AND DEBRIDEMENT RIGHT EXTREMITY (Right Leg Upper)     Patient location during evaluation: Other Anesthesia Type: General Level of consciousness: awake and alert Pain management: pain level controlled Vital Signs Assessment: post-procedure vital signs reviewed and stable Respiratory status: spontaneous breathing, nonlabored ventilation and respiratory function stable Cardiovascular status: blood pressure returned to baseline and stable Postop Assessment: no apparent nausea or vomiting Anesthetic complications: no    Last Vitals:  Vitals:   08/09/19 0140 08/09/19 0506  BP: 104/71 107/67  Pulse: 83 80  Resp: 17 18  Temp: 36.5 C 36.7 C  SpO2: 96% 97%    Last Pain:  Vitals:   08/09/19 0506  TempSrc: Oral  PainSc:                  Madison Albea,W. EDMOND

## 2019-08-09 NOTE — TOC Progression Note (Signed)
Transition of Care [facility]) - Progression Note    Patient Details  Name: Bradley Cox MRN: [id number] Date of Birth: Jul 23, 1971  [facility] of [facility]) CM/SW Contact  Carles Collet, RN Phone Number: 08/09/2019, 11:50 AM  Clinical Narrative:   Spoke to patient and discussed HH vs infusion at outpatient. Since he is going to be staying in [place] (and although his mom could drive him to OP center for infusions) it would be more convenient for [facility] to administer. Pam w [facility] infusions) will coordinate.  Patient to be started on Xaralto for DVT. Provided with 30 day coupon, would benefit from [place] to fill this prior to DC (M-F when they are open). Patient assistance forms for Xaralto on chart, communicated to Dr Karleen Hampshire to finish and sign. Papers will then be finished by patient (needs tax return attached) and faxed in by him. Patient unhappy with current PCP, discussed difficulties getting him to another clinic that accepts patients without insurance and need for [place] to have primary to contact as well as primary to follow for Jennye Moccasin and with that explained he is agreeable to continue in their care for now.     Expected Discharge Plan: [place] Barriers to Discharge: Continued Medical Work up  Expected Discharge Plan and Services Expected Discharge Plan: [place] In-house Referral: Clinical Social Work Discharge Planning Services: CM Consult Post Acute Care Choice: Home Health, Durable Medical Equipment Living arrangements for the past 2 months: [place]                 DME Arranged: 3-N-1, [place] rolling DME Agency: [employer] Date DME Agency Contacted: 08/06/19 Time DME Agency Contacted: 73 Representative spoke with at DME Agency: [place]: RN, PT, Social Work [employer] Agency: [employer] (Regino Ramirez) Date [place]: 08/06/19 Time [place]: 9 Representative spoke with at [place]: [place] ([place]) Interventions    Readmission Risk Interventions No flowsheet data found.

## 2019-08-09 NOTE — Progress Notes (Signed)
PROGRESS NOTE    Bradley Cox  [id number] DOB: [birth year] DOA: 07/05/2019 PCP: Simona Huh, NP   Brief Narrative:   48 year old gentleman with history of diabetes, essential hypertension, asthma presents with fever was found to have sepsis from MSSA infection with L3-S1 paraspinal abscess, right iliopsoas and right posterior thigh abscesses.  He underwent serial debridement by orthopedics.  Infectious disease consulted and recommended to continue with Ancef till 08/27/2019.  Hospital course complicated by right lower extremity DVT and he was started on Xarelto.  He also underwent repeat I&D on 10/7. Pt seen and examined at bedside.    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  Patient is afebrile and he had undergone serial debridements of the right thigh abscess by orthopedics.   He also underwent CT-guided drainage of the paraspinal abscess by IR. Infectious disease consulted and recommended to continue with IV Ancef till 08/27/2019.  As patient developed significant output from the incisional wound VAC orthopedics did a repeat incision and irrigation and drainage and wound closure on 08/08/2019.  Pain control and physical therapy evaluation. Plan for removal of hemo vac tomorrow if no significant increase in the output.  Possible discharge in am.     Anemia of chronic disease/anemia acute blood loss from repeated irrigation and drainage  Transfuse to keep hemoglobin greater than 7.  Patient  underwent 2 units of PRBC transfusion so far. Repeat H&H tonight.      DVT of the right lower extremities secondary to limited ambulation.  Patient required 2 units of PRBC transfusion already and he was started on Xarelto on 08/04/2019.    DKA Resolved    Essential hypertension  Well controlled.  Continue with hydralazine.   Asthma No wheezing heard    Obesity Outpatient follow-up with PCP for diet and exercise.  Hypokalemia  replaced    Diabetes mellitus  CBG (last 3)  Recent Labs    08/08/19 2153 08/09/19 0759 08/09/19 1151  GLUCAP 217* 210* 157*   Increase his NOVOLIN N to 30 units and continue with SSI.      DVT prophylaxis: Xarelto  code Status: Full code Family Communication: none at bed side today.  Disposition Plan: Pending further evaluation and management. Consultants:   Orthopedics  Procedures: Irrigation and debridement of right thigh abscess with closure on 08/08/2019 by Dr. Doreatha Martin Antimicrobials:cefazolin  Subjective: Persistent leg pain after working with PT.    Objective: Vitals:   08/08/19 2207 08/09/19 0140 08/09/19 0506 08/09/19 1625  BP: 115/68 104/71 107/67 113/71  Pulse: 89 83 80 89  Resp: 16 17 18 17   Temp: 98 F (36.7 C) 97.7 F (36.5 C) 98 F (36.7 C) 98.4 F (36.9 C)  TempSrc: Oral Oral Oral Oral  SpO2: 94% 96% 97% 96%  Weight:      Height:        Intake/Output Summary (Last 24 hours) at 08/09/2019 1732 Last data filed at 08/09/2019 1600 Gross per 24 hour  Intake 550 ml  Output 2125  ml  Net -1575 ml   Filed Weights   07/05/19 1807 07/06/19 2116 07/27/19 1113  Weight: (!) 167.4 kg (!) 164 kg (!) 164 kg    Examination:  General exam: Moderate distress from leg pain after working with PT Respiratory system: Clear to auscultation bilaterally no wheezing or rhonchi Cardiovascular system: S1  S2 heard, regular rate rhythm Gastrointestinal system: Abdomen is soft, nontender, nondistended, bowel sounds  are good Central nervous system: Alert and oriented, grossly nonfocal Extremities: Right lower extremity swollen VAC with some serosanguineous drain Skin: No rashes seen Psychiatry: Anxious    Data Reviewed: I have personally reviewed following labs and imaging studies  CBC: Recent Labs  Lab 08/03/19 0711 08/04/19 0813 08/05/19 0337 08/07/19 0425 08/08/19 0407  WBC 6.0 5.4 5.1 6.5 5.2  HGB 8.0* 7.4* 7.5* 7.6* 7.3*  HCT 25.8* 24.4* 25.1* 25.8* 25.1*  MCV 95.9 97.6 99.6 99.6 99.6  PLT 294 283 270 259 0000000   Basic Metabolic Panel: Recent Labs  Lab 08/07/19 0425  NA 136  K 4.1  CL 97*  CO2 28  GLUCOSE 130*  BUN 7  CREATININE 0.59*  CALCIUM 8.9   GFR: Estimated Creatinine Clearance: 188.1 mL/min (A) (by C-G formula based on SCr of 0.59 mg/dL (L)). Liver Function Tests: No results for input(s): AST, ALT, ALKPHOS, BILITOT, PROT, ALBUMIN in the last 168 hours. No results for input(s): LIPASE, AMYLASE in the last 168 hours. No results for input(s): AMMONIA in the last 168 hours. Coagulation Profile: No results for input(s): INR, PROTIME in the last 168 hours. Cardiac Enzymes: No results for input(s): CKTOTAL, CKMB, CKMBINDEX, TROPONINI in the last 168 hours. BNP (last 3 results) No results for input(s): PROBNP in the last 8760 hours. HbA1C: No results for input(s): HGBA1C in the last 72 hours. CBG: Recent Labs  Lab 08/08/19 1527 08/08/19 1659 08/08/19 2153 08/09/19 0759 08/09/19 1151  GLUCAP 133* 171* 217* 210* 157*   Lipid Profile: No results for input(s): CHOL, HDL, LDLCALC, TRIG, CHOLHDL, LDLDIRECT in the last 72 hours. Thyroid Function Tests: No results for input(s): TSH, T4TOTAL, FREET4, T3FREE, THYROIDAB in the last 72 hours. Anemia Panel: No results for input(s): VITAMINB12, FOLATE, FERRITIN, TIBC, IRON, RETICCTPCT in the last 72 hours. Sepsis Labs: No results for input(s): PROCALCITON, LATICACIDVEN in the last 168 hours.  Recent Results (from the  past 240 hour(s))  Surgical pcr screen     Status: None   Collection Time: 08/08/19  9:22 AM   Specimen: Nasal Mucosa; Nasal Swab  Result Value Ref Range Status   MRSA, PCR NEGATIVE NEGATIVE Final   Staphylococcus aureus NEGATIVE NEGATIVE Final    Comment: (NOTE) The Xpert SA Assay (FDA approved for NASAL specimens in patients 31 years of age and older), is one component of a comprehensive surveillance program. It is not intended to diagnose infection nor to guide or monitor treatment. Performed at [facility], [place] [address] [place], [place] [address]          Radiology Studies: No results found.      Scheduled Meds:  sodium chloride   Intravenous Once   sodium chloride   Intravenous Once   sodium chloride   Intravenous Once   acetaminophen  1,000 mg Oral TID   Chlorhexidine Gluconate Cloth  6 each Topical Daily   gabapentin  300 mg Oral TID   hydrALAZINE  25 mg Oral BID   influenza vac split quadrivalent PF  0.5 mL Intramuscular Tomorrow-1000  insulin aspart  0-15 Units Subcutaneous TID WC   insulin aspart  0-5 Units Subcutaneous QHS   insulin aspart  8 Units Subcutaneous TID WC   insulin NPH Human  28 Units Subcutaneous BID AC & HS   iron polysaccharides  150 mg Oral Daily   methocarbamol  750 mg Oral TID   metoCLOPramide  10 mg Oral TID AC & HS   multivitamin with minerals  1 tablet Oral Daily   polyethylene glycol  17 g Oral BID   Ensure Max Protein  11 oz Oral TID BM   rivaroxaban  15 mg Oral BID   Followed by   Derrill Memo ON 08/25/2019] rivaroxaban  20 mg Oral Q supper   senna-docusate  1 tablet Oral BID   sodium chloride flush  10-40 mL Intracatheter Q12H   Continuous Infusions:   ceFAZolin (ANCEF) IV 2 g (08/09/19 0934)     LOS: 34 days       Hosie Poisson, MD [facility] Pager [phone]  If 7PM-7AM, please contact night-coverage [email or web address] Password [facility] 08/09/2019, 5:32 PM

## 2019-08-09 NOTE — Progress Notes (Signed)
Ortho Trauma Note  S: Doing better this morning, significant pain post-operatively but better controlled now. Still with anemia. Hemovac with serosanguinous drainage but not significant amount. Minimal pain in back of leg, continues to have soreness and stiffness in right hip. Moving leg well. Hopeful to be discharged tomorrow.   O: General - NAD, AAOx3 RLE - Dressing clean, dry, intact. Hemovac with some serosanguinous output, no purulence noted. Moves leg much better. Motor and sensory function intact.   Results for orders placed or performed during the hospital encounter of 07/05/19 (from the past 24 hour(s))  Surgical pcr screen     Status: None   Collection Time: 08/08/19  9:22 AM   Specimen: Nasal Mucosa; Nasal Swab  Result Value Ref Range   MRSA, PCR NEGATIVE NEGATIVE   Staphylococcus aureus NEGATIVE NEGATIVE  Glucose, capillary     Status: None   Collection Time: 08/08/19  9:58 AM  Result Value Ref Range   Glucose-Capillary 97 70 - 99 mg/dL  Type and screen     Status: None   Collection Time: 08/08/19 11:28 AM  Result Value Ref Range   ABO/RH(D) A POS    Antibody Screen NEG    Sample Expiration      08/11/2019,2359 Performed at [facility], [place] [address] [place], [place] [address]   Glucose, capillary     Status: Abnormal   Collection Time: 08/08/19  1:06 PM  Result Value Ref Range   Glucose-Capillary 111 (H) 70 - 99 mg/dL  Glucose, capillary     Status: Abnormal   Collection Time: 08/08/19  3:27 PM  Result Value Ref Range   Glucose-Capillary 133 (H) 70 - 99 mg/dL  Glucose, capillary     Status: Abnormal   Collection Time: 08/08/19  4:59 PM  Result Value Ref Range   Glucose-Capillary 171 (H) 70 - 99 mg/dL  Glucose, capillary     Status: Abnormal   Collection Time: 08/08/19  9:53 PM  Result Value Ref Range   Glucose-Capillary 217 (H) 70 - 99 mg/dL  Glucose, capillary     Status: Abnormal   Collection Time: 08/09/19  7:59 AM  Result Value Ref Range   Glucose-Capillary 210 (H) 70 - 99 mg/dL    Plan: Okay to restart and continue anticoagulation per hospitalist team. Plan to pull hemovac tomorrow if no significant increase in output. Hopefully discharge home tomorrow.   Amaziah Ghosh A. [facility] ?[phone]? (phone)

## 2019-08-09 NOTE — Evaluation (Signed)
Physical Therapy Evaluation Patient Details Name: Bradley Cox MRN: [id number] DOB: 16-Nov-1970 Today's Date: 08/09/2019   History of Present Illness  Patient is a 48 y/o male who presents with right flank pain. Admitted with disseminated MSSA infection including bacteremia, Lumbosacral spinal epidural abscess, paraspinal/large gluteal/pelvic abscess with sciatica impingement. s/p I&D large gluteal abscess and neuroplasty of sciatica nerve 9/7. He has received multiple R LE I&Ds and wound vac placements/changes on 9/15, 9/18, 9/25, 9/28, and 10/7. Wound vac removed/hemovac placed 10/7.  s/p CT aspiration right paraspinal abscess. PMH includes DM, HTN, morbid obesity, OSA.  Clinical Impression   Re-eval performed after additional significant surgery on 08/08/19. Patient able to perform bed mobility with min guardx2, functional transfers with RW and min guard x2, and gait approximately 49f in room with min guard and tech assisting with line management. VSS during session, but patient very easily fatigued and required multiple standing rest breaks. Able to return to bed with min guard, while getting back into bed his heel bumped hemovac line and connector mid-line became disconnected (drain still firmly in LE, did not appear to pull from surgical site), re-connected and asked RN to please double check. He was left in bed with all needs met, all questions/concerns addressed today. Continue to recommend CIR and 24/7 assist.   Patient suffers from functional muscle weakness, impaired functional activity tolerance, and difficulty ambulating which impairs their ability to perform daily activities like ambulate safely in the home.  A walker alone will not resolve the issues with performing activities of daily living. A wheelchair will allow patient to safely perform daily activities.  The patient can self propel in the home or has a caregiver who can provide assistance.        Follow Up Recommendations  CIR;Supervision/Assistance - 24 hour(HHPT if CIR doesn't work out)    EClinical biochemistwith 5" wheels;Wheelchair (measurements PT);3in1 (PT);Wheelchair cushion (measurements PT);Other (comment)(elevating leg rests on WC)    Recommendations for Other Services       Precautions / Restrictions Precautions Precautions: Fall Precaution Comments: hemovac to R posterior thigh, check BPs Restrictions Weight Bearing Restrictions: Yes RLE Weight Bearing: Weight bearing as tolerated      Mobility  Bed Mobility Overal bed mobility: Needs Assistance Bed Mobility: Supine to Sit;Sit to Supine     Supine to sit: Min guard;+2 for safety/equipment Sit to supine: Min guard;+2 for safety/equipment   General bed mobility comments: bed mobility with min guard x2, assist for line management  Transfers Overall transfer level: Needs assistance Equipment used: Rolling walker (2 wheeled) Transfers: Sit to/from Stand Sit to Stand: From elevated surface;Min guard;+2 safety/equipment         General transfer comment: min guardx2 for safety today, R LE painful but mobility much improved  Ambulation/Gait Ambulation/Gait assistance: +2 safety/equipment;Min guard Gait Distance (Feet): 30 Feet Assistive device: Rolling walker (2 wheeled) Gait Pattern/deviations: Step-through pattern;Decreased stride length;Trunk flexed;Decreased step length - left;Decreased stance time - right;Decreased weight shift to right;Wide base of support Gait velocity: decreased   General Gait Details: wide BOS, min guard for safety, multiple rest breaks provided due to fatigue  Stairs            Wheelchair Mobility    Modified Rankin (Stroke Patients Only)       Balance Overall balance assessment: Needs assistance Sitting-balance support: No upper extremity supported;Feet supported Sitting balance-Leahy Scale: Good     Standing balance support: Bilateral upper extremity  supported;During functional activity  Standing balance-Leahy Scale: Poor Standing balance comment: heavy reliance on B UEs                             Pertinent Vitals/Pain Pain Assessment: Faces Faces Pain Scale: Hurts even more Pain Location: R LE with movement Pain Descriptors / Indicators: Aching;Grimacing;Guarding;Discomfort Pain Intervention(s): Limited activity within patient's tolerance;Monitored during session    Home Living Family/patient expects to be discharged to:: Private residence Living Arrangements: Parent;Children Available Help at Discharge: Family;Available PRN/intermittently Type of Home: House Home Access: Stairs to enter Entrance Stairs-Rails: Right Entrance Stairs-Number of Steps: 3-4 Home Layout: One level Home Equipment: None      Prior Function Level of Independence: Independent         Comments: Truck driver.     Hand Dominance   Dominant Hand: Right    Extremity/Trunk Assessment   Upper Extremity Assessment Upper Extremity Assessment: Overall WFL for tasks assessed    Lower Extremity Assessment RLE Deficits / Details: limited due to pain, less so that at initial eval; able to move LE along sheet RLE: Unable to fully assess due to pain    Cervical / Trunk Assessment Cervical / Trunk Assessment: Other exceptions Cervical / Trunk Exceptions: Increased body habitus  Communication   Communication: No difficulties  Cognition Arousal/Alertness: Awake/alert Behavior During Therapy: WFL for tasks assessed/performed Overall Cognitive Status: Within Functional Limits for tasks assessed                                 General Comments: happy about transition from wound vac to hemovac, looking forward to possible DC tomorrow      General Comments General comments (skin integrity, edema, etc.): VSS during session    Exercises     Assessment/Plan    PT Assessment Patient needs continued PT services  PT  Problem List Decreased strength;Decreased mobility;Obesity;Decreased range of motion;Decreased activity tolerance;Cardiopulmonary status limiting activity;Decreased skin integrity;Pain;Decreased balance;Decreased knowledge of use of DME       PT Treatment Interventions Therapeutic activities;Gait training;Therapeutic exercise;Patient/family education;Balance training;Neuromuscular re-education;Functional mobility training;Wheelchair mobility training;DME instruction;Stair training    PT Goals (Current goals can be found in the Care Plan section)  Acute Rehab PT Goals Patient Stated Goal: keep getting better PT Goal Formulation: With patient Time For Goal Achievement: 08/08/19 Potential to Achieve Goals: Good    Frequency Min 3X/week   Barriers to discharge Decreased caregiver support      Co-evaluation               AM-PAC PT "6 Clicks" Mobility  Outcome Measure Help needed turning from your back to your side while in a flat bed without using bedrails?: None Help needed moving from lying on your back to sitting on the side of a flat bed without using bedrails?: A Little Help needed moving to and from a bed to a chair (including a wheelchair)?: A Little Help needed standing up from a chair using your arms (e.g., wheelchair or bedside chair)?: A Little Help needed to walk in hospital room?: A Little Help needed climbing 3-5 steps with a railing? : A Lot 6 Click Score: 18    End of Session Equipment Utilized During Treatment: Gait belt Activity Tolerance: Patient tolerated treatment well;Patient limited by fatigue Patient left: in bed;with call bell/phone within reach Nurse Communication: Mobility status;Other (comment)(asked to check hemovac tubing) PT Visit Diagnosis: Other  abnormalities of gait and mobility (R26.89);Muscle weakness (generalized) (M62.81);Pain;Difficulty in walking, not elsewhere classified (R26.2) Pain - Right/Left: Right Pain - part of body: Hip     Time: 1340-1410 PT Time Calculation (min) (ACUTE ONLY): 30 min   Charges:   PT Evaluation $PT Re-evaluation: 1 Re-eval PT Treatments $Gait Training: 8-22 mins        Deniece Ree PT, DPT, CBIS  Supplemental Physical Therapist [place]    Pager [phone] Acute Rehab Office [phone]

## 2019-08-10 LAB — BASIC METABOLIC PANEL
Anion gap: 10 (ref 5–15)
BUN: 9 mg/dL (ref 6–20)
CO2: 27 mmol/L (ref 22–32)
Calcium: 8.6 mg/dL — ABNORMAL LOW (ref 8.9–10.3)
Chloride: 98 mmol/L (ref 98–111)
Creatinine, Ser: 0.53 mg/dL — ABNORMAL LOW (ref 0.61–1.24)
GFR calc Af Amer: >60 mL/min (ref >60)
GFR calc non Af Amer: >60 mL/min (ref >60)
Glucose, Bld: 159 mg/dL — ABNORMAL HIGH (ref 70–99)
Potassium: 3.8 mmol/L (ref 3.5–5.1)
Sodium: 135 mmol/L (ref 135–145)

## 2019-08-10 LAB — CBC
HCT: 25.8 % — ABNORMAL LOW (ref 39.0–52.0)
Hemoglobin: 7.5 g/dL — ABNORMAL LOW (ref 13.0–17.0)
MCH: 29.2 pg (ref 26.0–34.0)
MCHC: 29.1 g/dL — ABNORMAL LOW (ref 30.0–36.0)
MCV: 100.4 fL — ABNORMAL HIGH (ref 80.0–100.0)
Platelets: 252 10*3/uL (ref 150–400)
RBC: 2.57 MIL/uL — ABNORMAL LOW (ref 4.22–5.81)
RDW: 15.5 % (ref 11.5–15.5)
WBC: 10 10*3/uL (ref 4.0–10.5)
nRBC: 0.2 % (ref 0.0–0.2)

## 2019-08-10 LAB — GLUCOSE, CAPILLARY
Glucose-Capillary: 146 mg/dL — ABNORMAL HIGH (ref 70–99)
Glucose-Capillary: 91 mg/dL (ref 70–99)
Glucose-Capillary: 157 mg/dL — ABNORMAL HIGH (ref 70–99)
Glucose-Capillary: 162 mg/dL — ABNORMAL HIGH (ref 70–99)

## 2019-08-10 MED ORDER — PROPOFOL 500 MG/50ML IV EMUL
INTRAVENOUS | Status: AC
  Filled 2019-08-10: qty 100

## 2019-08-10 MED ORDER — PHENYLEPHRINE HCL-NACL 10-0.9 MG/250ML-% IV SOLN
INTRAVENOUS | Status: AC
  Filled 2019-08-10: qty 500

## 2019-08-10 MED ORDER — GABAPENTIN 300 MG PO CAPS: 300.0000 mg | ORAL_CAPSULE | Freq: Three times a day (TID) | ORAL | 0 refills | Status: DC

## 2019-08-10 MED ORDER — RIVAROXABAN 20 MG PO TABS: 20.0000 mg | ORAL_TABLET | Freq: Every day | ORAL | 1 refills | Status: DC

## 2019-08-10 MED ORDER — POLYSACCHARIDE IRON COMPLEX 150 MG PO CAPS: 150.0000 mg | ORAL_CAPSULE | Freq: Every day | ORAL | 0 refills | Status: DC

## 2019-08-10 MED ORDER — PROPOFOL 1000 MG/100ML IV EMUL
INTRAVENOUS | Status: AC
  Filled 2019-08-10: qty 200

## 2019-08-10 MED ORDER — OXYCODONE HCL 10 MG PO TABS: 10.0000 mg | ORAL_TABLET | Freq: Four times a day (QID) | ORAL | 0 refills | Status: AC | PRN

## 2019-08-10 MED ORDER — RIVAROXABAN (XARELTO) VTE STARTER PACK (15 & 20 MG): ORAL_TABLET | ORAL | 0 refills | Status: DC

## 2019-08-10 MED ORDER — SENNOSIDES-DOCUSATE SODIUM 8.6-50 MG PO TABS: 1.0000 | ORAL_TABLET | Freq: Two times a day (BID) | ORAL | Status: DC

## 2019-08-10 MED ORDER — RIVAROXABAN 15 MG PO TABS: 15.0000 mg | ORAL_TABLET | Freq: Two times a day (BID) | ORAL | 0 refills | Status: DC

## 2019-08-10 MED ORDER — POLYETHYLENE GLYCOL 3350 17 G PO PACK: 17.0000 g | PACK | Freq: Every day | ORAL | 0 refills | Status: DC | PRN

## 2019-08-10 MED ORDER — METHOCARBAMOL 750 MG PO TABS: 750.0000 mg | ORAL_TABLET | Freq: Three times a day (TID) | ORAL | 0 refills | Status: DC | PRN

## 2019-08-10 MED ORDER — INSULIN NPH (HUMAN) (ISOPHANE) 100 UNIT/ML ~~LOC~~ SUSP: 28.0000 [IU] | Freq: Two times a day (BID) | SUBCUTANEOUS | 11 refills | Status: DC

## 2019-08-10 MED ORDER — CEFAZOLIN IV (FOR PTA / DISCHARGE USE ONLY): 2.0000 g | Freq: Three times a day (TID) | INTRAVENOUS | 0 refills | Status: DC

## 2019-08-10 MED FILL — NovoLIN N 100 UNIT/ML SUSP: 100 | 18 days supply | Qty: 10 | Fill #0

## 2019-08-10 MED FILL — POLYETHYLENE GLYCOL 3350 PO: 17 | 14 days supply | Qty: 238 | Fill #0

## 2019-08-10 MED FILL — ULTICARE INS 0.5 ML 30GX1/2: 30G X 1/2" | 30 days supply | Qty: 60 | Fill #0

## 2019-08-10 MED FILL — oxyCODONE HCL 10 MG TABS: 10 | 3 days supply | Qty: 12 | Fill #0

## 2019-08-10 MED FILL — GABAPENTIN 300 MG CAPSULE: 300 | 30 days supply | Qty: 90 | Fill #0

## 2019-08-10 MED FILL — XARELTO STARTER PACK: 15 & 20 | 30 days supply | Qty: 51 | Fill #0

## 2019-08-10 MED FILL — METHOCARBAMOL 750 MG TABS: 750 | 7 days supply | Qty: 20 | Fill #0

## 2019-08-10 MED FILL — POLY-IRON 150 MG CAPSULE: 150 | 30 days supply | Qty: 30 | Fill #0

## 2019-08-10 NOTE — TOC Progression Note (Addendum)
Transition of Care [facility]) - Progression Note    Patient Details  Name: CHAPMAN TRETTIN MRN: [id number] Date of Birth: 1971/09/26  Transition of Care [facility]) CM/SW Contact  Jacalyn Lefevre Edson Snowball, RN Phone Number: 08/10/2019, 1:32 PM  Clinical Narrative:     Spoke to patient and his mother at bedside. Patient does not qualify for charity home health . Patient's mother has spoke to [place]. Patient will private pay for [facility] for PICC care. Patient and mother will need to be taught wound care. Patient's mother has a friend who is a Marine scientist.   Patient was not accepted to CIR. For home health PT he would have to private pay. Patient and mother prefers if PT here at [facility] can provide exercise instructions for home. Spoke with Robert Bellow with PT .  TOC will fill prescriptions today and give to the mother to take home. MD sign Jennye Moccasin assistance paperwork, given to patient's mother.  Expected Discharge Plan: [place] Barriers to Discharge: Continued Medical Work up  Expected Discharge Plan and Services Expected Discharge Plan: [place] In-house Referral: Clinical Social Work Discharge Planning Services: CM Consult Post Acute Care Choice: Home Health, Durable Medical Equipment Living arrangements for the past 2 months: [place]                 DME Arranged: 3-N-1, Walker rolling DME Agency: [employer] Date DME Agency Contacted: 08/06/19 Time DME Agency Contacted: 76 Representative spoke with at DME Agency: [place]: RN, PT, Social Work [employer] Agency: [place] ([place]) Date [place]: 08/06/19 Time [place]: 103 Representative spoke with at [place]: [place] ([place]) Interventions    Readmission Risk Interventions No flowsheet data found.

## 2019-08-10 NOTE — Progress Notes (Addendum)
Ortho Trauma Note  S: Doing okay, pain controlled. Still with anemia. Hemovac with serosanguinous drainage but not significant amount. Minimal pain in back of leg, continues to have soreness and stiffness in right hip. Moving leg well. Mother at bedside. Patient noted he had some drainage from hip incision starting last night. Not significant amount, appeared serosanguinous.  O: General - NAD, AAOx3 RLE - Dressing removed, incisions appear clean, dry, intact.  Hemovac with some serosanguinous output, no purulence noted. Hemovac removed. Dressing to hip is clean, dry, intact. Moves leg much better. Motor and sensory function intact.   Results for orders placed or performed during the hospital encounter of 07/05/19 (from the past 24 hour(s))  Glucose, capillary     Status: Abnormal   Collection Time: 08/09/19 11:51 AM  Result Value Ref Range   Glucose-Capillary 157 (H) 70 - 99 mg/dL  Glucose, capillary     Status: Abnormal   Collection Time: 08/09/19  5:40 PM  Result Value Ref Range   Glucose-Capillary 155 (H) 70 - 99 mg/dL  Basic metabolic panel     Status: Abnormal   Collection Time: 08/09/19  6:50 PM  Result Value Ref Range   Sodium 133 (L) 135 - 145 mmol/L   Potassium 3.6 3.5 - 5.1 mmol/L   Chloride 96 (L) 98 - 111 mmol/L   CO2 29 22 - 32 mmol/L   Glucose, Bld 162 (H) 70 - 99 mg/dL   BUN 12 6 - 20 mg/dL   Creatinine, Ser 0.56 (L) 0.61 - 1.24 mg/dL   Calcium 8.4 (L) 8.9 - 10.3 mg/dL   GFR calc non Af Amer >60 >60 mL/min   GFR calc Af Amer >60 >60 mL/min   Anion gap 8 5 - 15  Hemoglobin and hematocrit, blood     Status: Abnormal   Collection Time: 08/09/19  6:50 PM  Result Value Ref Range   Hemoglobin 7.8 (L) 13.0 - 17.0 g/dL   HCT 24.9 (L) 39.0 - 52.0 %  Glucose, capillary     Status: Abnormal   Collection Time: 08/09/19  8:34 PM  Result Value Ref Range   Glucose-Capillary 148 (H) 70 - 99 mg/dL  CBC     Status: Abnormal   Collection Time: 08/10/19  4:40 AM  Result Value Ref  Range   WBC 10.0 4.0 - 10.5 K/uL   RBC 2.57 (L) 4.22 - 5.81 MIL/uL   Hemoglobin 7.5 (L) 13.0 - 17.0 g/dL   HCT 25.8 (L) 39.0 - 52.0 %   MCV 100.4 (H) 80.0 - 100.0 fL   MCH 29.2 26.0 - 34.0 pg   MCHC 29.1 (L) 30.0 - 36.0 g/dL   RDW 15.5 11.5 - 15.5 %   Platelets 252 150 - 400 K/uL   nRBC 0.2 0.0 - 0.2 %  Basic metabolic panel     Status: Abnormal   Collection Time: 08/10/19  4:40 AM  Result Value Ref Range   Sodium 135 135 - 145 mmol/L   Potassium 3.8 3.5 - 5.1 mmol/L   Chloride 98 98 - 111 mmol/L   CO2 27 22 - 32 mmol/L   Glucose, Bld 159 (H) 70 - 99 mg/dL   BUN 9 6 - 20 mg/dL   Creatinine, Ser 0.53 (L) 0.61 - 1.24 mg/dL   Calcium 8.6 (L) 8.9 - 10.3 mg/dL   GFR calc non Af Amer >60 >60 mL/min   GFR calc Af Amer >60 >60 mL/min   Anion gap 10 5 - 15  Glucose, capillary     Status: Abnormal   Collection Time: 08/10/19  7:43 AM  Result Value Ref Range   Glucose-Capillary 146 (H) 70 - 99 mg/dL    Plan: Continue anticoagulation per hospitalist team. Hemovac removed, no significant increase in output, 7mL total. Okay to be discharged from orthopaedic standpoint. Emphasized to patient and his mother that these incisions may continue to drain some over the next several weeks, this is normal.  Patient will follow-up with Dr. Doreatha Martin in 2 weeks for suture removal and wound check   Adaijah Endres A. [facility] ?[phone]? (phone)

## 2019-08-10 NOTE — Progress Notes (Signed)
Physical Therapy Treatment Patient Details Name: Bradley Cox MRN: [id number] DOB: [birth year] Today's Date: 08/10/2019    History of Present Illness Patient is a 48 y/o male who presents with right flank pain. Admitted with disseminated MSSA infection including bacteremia, Lumbosacral spinal epidural abscess, paraspinal/large gluteal/pelvic abscess with sciatica impingement. s/p I&D large gluteal abscess and neuroplasty of sciatica nerve 9/7. He has received multiple R LE I&Ds and wound vac placements/changes on 9/15, 9/18, 9/25, 9/28, and 10/7. Wound vac removed/hemovac placed 10/7.  s/p CT aspiration right paraspinal abscess. PMH includes DM, HTN, morbid obesity, OSA.    PT Comments    Patient reports he has been up today and is in a lot of pain. Declines further OOB activities. He performed B LE exercises in bed. HEP given and reviewed with patient. Patient will continue to benefit from skilled PT to address his weakness and difficulty with mobility.       Follow Up Recommendations  Supervision for mobility/OOB     Equipment Recommendations  3in1 (PT)    Recommendations for Other Services       Precautions / Restrictions Precautions Precautions: Fall Restrictions Weight Bearing Restrictions: Yes RLE Weight Bearing: Weight bearing as tolerated    Mobility  Bed Mobility               General bed mobility comments: patient declined OOB activity due to pain and reports he has walked in the room  Transfers                 General transfer comment: patient declines  Ambulation/Gait                 Stairs             Wheelchair Mobility    Modified Rankin (Stroke Patients Only)       Balance                                            Cognition Arousal/Alertness: Awake/alert Behavior During Therapy: WFL for tasks assessed/performed Overall Cognitive Status: Within Functional Limits for tasks assessed                                         Exercises Other Exercises Other Exercises: patient given HEP and reviewed with patient, he verbalized understanding Other Exercises: AP, heel slides, SLR, hip abd/add x 5-10 reps as tolerated    General Comments        Pertinent Vitals/Pain Pain Assessment: Faces Faces Pain Scale: Hurts even more Pain Location: R LE with movement Pain Descriptors / Indicators: Aching;Sore;Discomfort;Grimacing;Guarding Pain Intervention(s): Limited activity within patient's tolerance;Monitored during session;Patient requesting pain meds-RN notified    Home Living                      Prior Function            PT Goals (current goals can now be found in the care plan section) Acute Rehab PT Goals Patient Stated Goal: keep getting better PT Goal Formulation: With patient Time For Goal Achievement: 08/14/19 Potential to Achieve Goals: Good Progress towards PT goals: Progressing toward goals    Frequency    Min 3X/week      PT Plan Current plan remains appropriate  Co-evaluation              AM-PAC PT "6 Clicks" Mobility   Outcome Measure  Help needed turning from your back to your side while in a flat bed without using bedrails?: None Help needed moving from lying on your back to sitting on the side of a flat bed without using bedrails?: A Little Help needed moving to and from a bed to a chair (including a wheelchair)?: A Little Help needed standing up from a chair using your arms (e.g., wheelchair or bedside chair)?: A Little Help needed to walk in hospital room?: A Little Help needed climbing 3-5 steps with a railing? : A Lot 6 Click Score: 18    End of Session   Activity Tolerance: Patient limited by pain Patient left: in bed;with call bell/phone within reach Nurse Communication: Mobility status PT Visit Diagnosis: Other abnormalities of gait and mobility (R26.89);Muscle weakness (generalized) (M62.81);Pain;Difficulty  in walking, not elsewhere classified (R26.2) Pain - Right/Left: Right Pain - part of body: Leg;Hip     Time: 1350-1413 PT Time Calculation (min) (ACUTE ONLY): 23 min  Charges:  $Therapeutic Exercise: 23-37 mins                     Raheim Beutler, PT, GCS 08/10/19,2:21 PM

## 2019-08-10 NOTE — Discharge Summary (Signed)
Physician Discharge Summary  SENECA HOBACK DXI:[id number] DOB: 1970-11-24 DOA: 07/05/2019  PCP: Simona Huh, NP  Admit date: 07/05/2019 Discharge date: 08/11/2019  Admitted From: home.  Disposition:  Home.   Recommendations for Outpatient Follow-up:  1. Follow up with PCP in 1-2 weeks 2. Please obtain BMP/CBC in one week 3. Please follow up  With ID as recommended.   Home Health:YES Equipment/Devices:PICC LINE.   Discharge Condition:stable.  CODE STATUS:full code.  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  He underwent serial debridement by orthopedics.  Infectious disease consulted and recommended to continue with Ancef till 08/27/2019.  Hospital course complicated by right lower extremity DVT and he was started on Xarelto.  He also underwent repeat I&D on 10/7. Hemo vac removed on 08/10/19.   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  Patient is afebrile and he had undergone serial debridements of the right thigh abscess by orthopedics.   He also underwent CT-guided drainage of the paraspinal abscess by IR. Infectious disease consulted and recommended to continue with IV Ancef till 08/27/2019.  As patient developed significant output from the incisional wound  [facility] did a repeat incision and irrigation and drainage and wound closure on 08/08/2019.  Pain control and physical therapy evaluation. HEMOVAC removed by orthopedics today. But pt reported that he has no means of going home today and his mother would pick him up in the morning.  Possible discharge in am.     Anemia of chronic disease/anemia acute blood loss from repeated irrigation and drainage  Transfuse to keep hemoglobin greater than 7.  Patient underwent 2 units of PRBC transfusion so far.      DVT of the right lower extremities secondary to limited ambulation.  Patient required 2 units of PRBC transfusion already and he was started on Xarelto on 08/04/2019.    DKA Resolved    Essential hypertension  Well controlled.  Continue with hydralazine.   Asthma No wheezing heard    Obesity Outpatient follow-up with PCP for diet and exercise.  Hypokalemia  replaced    Diabetes mellitus Resume NOVOLIN N. 28 units BID and SSI.        Discharge Instructions  Discharge Instructions    Home infusion instructions [facility] May follow [place] Dosing Protocol; May administer Cathflo as needed to maintain patency of vascular access device.; Flushing of vascular access device: per [facility] Protocol: 0.9% NaCl pre/post medica...   Complete by: As directed    Instructions: May follow [place] Dosing Protocol   Instructions: May administer Cathflo as needed to maintain patency of vascular access device.   Instructions: Flushing of vascular access device: per [facility] Protocol: 0.9% NaCl pre/post medication administration and prn patency; Heparin 100 u/ml, 28m for implanted ports and Heparin 10u/ml, 57mfor all other central venous catheters.   Instructions:  May follow [facility] Anaphylaxis Protocol for First Dose Administration in the home: 0.9% NaCl at 25-50 ml/hr to maintain IV access for protocol meds. Epinephrine 0.3 ml IV/IM PRN and  Benadryl 25-50 IV/IM PRN s/s of anaphylaxis.   Instructions: [facility]fusion Coordinator (RN) to assist per patient IV care needs in the home PRN.     Allergies as of 08/10/2019      Reactions   Tramadol Anaphylaxis      Medication List    STOP taking these medications   ibuprofen 200 MG tablet Commonly known as: ADVIL     TAKE these medications   albuterol 108 (90 Base) MCG/ACT inhaler Commonly known as: VENTOLIN HFA Inhale 1 puff into the lungs every 6 (six) hours as needed for wheezing or shortness of breath.   blood glucose meter kit and supplies Kit Dispense based on patient and insurance preference. Use up to four times daily as directed. (FOR ICD-9 250.00, 250.01).   ceFAZolin  IVPB Commonly known as: ANCEF Inject 2 g into the vein every 8 (eight) hours for 27 days. Indication:  MSSA/Epidural Abscess Last Day of Therapy:  08/27/2019 Labs - Once weekly:  CBC/D and BMP, Labs - Every other week:  ESR and CRP   gabapentin 300 MG capsule Commonly known as: NEURONTIN Take 1 capsule (300 mg total) by mouth 3 (three) times daily.   hydrALAZINE 50 MG tablet Commonly known as: APRESOLINE Take 50 mg by mouth 2 (two) times daily.   insulin NPH Human 100 UNIT/ML injection Commonly known as: NOVOLIN N Inject 0.28 mLs (28 Units total) into the skin 2 (two) times daily at 8 am and 10 pm.   iron polysaccharides 150 MG capsule Commonly known as: NIFEREX Take 1 capsule (150 mg total) by mouth daily. Start taking on: August 11, 2019   metFORMIN 500 MG tablet Commonly known as: GLUCOPHAGE Take 1 tablet (500 mg total) by mouth 2 (two) times daily with a meal.   methocarbamol 750 MG tablet Commonly known as: ROBAXIN Take 1 tablet (750 mg total) by mouth every 8 (eight) hours as needed for muscle spasms.   Oxycodone HCl 10 MG Tabs Take 1 tablet (10 mg total) by mouth every 6 (six) hours as needed for up to 3 days for moderate pain.   polyethylene glycol 17 g  packet Commonly known as: MIRALAX / GLYCOLAX Take 17 g by mouth daily as needed.   Rivaroxaban 15 & 20 MG Tbpk Follow package directions: Take one 58m tablet by mouth twice a day. On day 22, switch to one 272mtablet once a day. Take with food.   senna-docusate 8.6-50 MG tablet Commonly known as: Senokot-S Take 1 tablet by mouth 2 (two) times daily.   Thera Tabs Take 2 tablets by mouth daily.            Home Infusion Instuctions  (From admission, onward)         Start     Ordered   08/10/19 0000  Home infusion instructions [facility] May follow [facility] Protocol; May administer Cathflo as needed to maintain patency of vascular access device.; Flushing of vascular access device[facility]: 0.9% NaCl pre/post medica...    Question Answer Comment  Instructions May follow [place] [place]   Instructions May administer Cathflo as needed to maintain patency of vascular access device.   Instructions Flushing of vascular access device: [facility]: 0.9% NaCl pre/post medication administration and prn  patency; Heparin 100 u/ml, 75m for implanted ports and Heparin 10u/ml, 520mfor all other central venous catheters.   Instructions May follow [facility] Anaphylaxis Protocol for First Dose Administration in the home: 0.9% NaCl at 25-50 ml/hr to maintain IV access for protocol meds. Epinephrine 0.3 ml IV/IM PRN and Benadryl 25-50 IV/IM PRN s/s of anaphylaxis.   Instructions [facility] Infusion Coordinator (RN) to assist per patient IV care needs in the home PRN.      08/10/19 1310           Durable Medical Equipment  (From admission, onward)         Start     Ordered   08/06/19 0837  For home use only DME standard manual wheelchair with seat cushion  Once    Comments: Patient suffers from Sepsis secondary to disseminated MSSA infection including bacteremia, L3-S1 paraspinal abscess, right psoas abscess right posterior thigh abscesswhich  impairs their ability to perform daily activities like ambulating  in the home.  A cane  will not resolve issue with performing activities of daily living. A wheelchair will allow patient to safely perform daily activities. Patient can safely propel the wheelchair in the home or has a caregiver who can provide assistance. Length of need lifetime  Accessories: elevating leg rests (ELRs), wheel locks, extensions and anti-tippers.  Seat and back cushions   Extra heavy duty   08/06/19 0838   08/06/19 0810  For home use only DME Walker rolling  Once    Question:  Patient needs a walker to treat with the following condition  Answer:  Weakness   08/06/19 0809   08/06/19 0810  For home use only DME 3 n 1  Once     08/06/19 081610       Follow-up Information    Comer, RoOkey RegalMD Follow up on 08/23/2019.   Specialty: Infectious Diseases Why: 9:30 a.m. appointment. Please arrive 15 minutes prior to so you may complete registration.  Contact information: [id number]   Why: follow up appointment on August 31, 2019 at 0930 am  Contact information: [address] [place] [phone]     HaShona NeedlesMD. Schedule an appointment as soon as possible for a visit in 2 weeks.   Specialty: Orthopedic Surgery Why: wound check, suture removal Contact information: [phone]        McSimona HuhNP. Schedule an appointment as soon as possible for a visit in 1 week(s).   Specialty: Nurse Practitioner Contact information: [phone]        Allergies  Allergen Reactions  . Tramadol Anaphylaxis    Consultations:  Orthopedics.    Procedures/Studies: Dg Abd 1 View  Result Date: 07/15/2019 CLINICAL DATA:  Abdominal pain. EXAM: ABDOMEN - 1 VIEW  COMPARISON:  07/12/2019 FINDINGS: Nondistended gas-filled loops of small bowel and colon noted. Nondistended gas-filled stomach is present. No suspicious calcifications are present. Stool in the colon is present. IMPRESSION: Nonspecific nonobstructive bowel gas pattern with nondistended gas-filled stomach, small bowel and colon. Electronically Signed   By: JeMargarette Canada.D.   On: 07/15/2019 17:02   Dg Abd 1 View  Result Date: 07/12/2019 CLINICAL DATA:  NG tube placement EXAM: ABDOMEN - 1 VIEW COMPARISON:  None. FINDINGS: Nasogastric tube tip and side port project over the stomach. Lung bases are clear. No dilated bowel is visible. IMPRESSION: NG tube tip and side port project over the stomach. Electronically Signed   By: Ulyses Jarred M.D.   On: 07/12/2019 00:33   Dg Abd 1 View  Result Date: 07/11/2019 CLINICAL DATA:  Nausea and vomiting with abdominal pain EXAM: ABDOMEN - 1 VIEW COMPARISON:  07/06/2019 FINDINGS: Marked gaseous dilatation of the stomach. Otherwise nonobstructed gas pattern with gas in the small and large bowel. Findings could be secondary to enteritis or mild ileus. IMPRESSION: 1. Incompletely visualized marked gaseous dilatation of stomach, consider NG tube decompression. Outlet obstruction could be considered. 2. Remainder of the gas pattern shows air-filled small and large bowel in a nonobstructed pattern, question enteritis or mild ileus. Electronically Signed   By: Donavan Foil M.D.   On: 07/11/2019 21:58   Ct Angio Chest Pe W Or Wo Contrast  Result Date: 08/01/2019 CLINICAL DATA:  48 year old male with syncope. EXAM: CT ANGIOGRAPHY CHEST WITH CONTRAST TECHNIQUE: Multidetector CT imaging of the chest was performed using the standard protocol during bolus administration of intravenous contrast. Multiplanar CT image reconstructions and MIPs were obtained to evaluate the vascular anatomy. CONTRAST:  121m OMNIPAQUE IOHEXOL 350 MG/ML SOLN COMPARISON:  None. FINDINGS: Cardiovascular:  Borderline cardiomegaly. No pericardial effusion. Multi vessel coronary vascular calcification. The thoracic aorta is unremarkable. Evaluation of the pulmonary arteries is somewhat limited due to suboptimal opacification and visualization of the peripheral branches and respiratory motion artifact. No large or central pulmonary artery embolus identified. Right sided PICC with tip close to the cavoatrial junction noted. Mediastinum/Nodes: There is no hilar or mediastinal adenopathy. The esophagus and the thyroid gland are grossly unremarkable. No mediastinal fluid collection. Lungs/Pleura: Bibasilar linear atelectasis/scarring. No lobar consolidation, pleural effusion pneumothorax. The central airways are patent. Upper Abdomen: Multiple gallstones. The visualized upper abdomen is otherwise unremarkable. Musculoskeletal: No chest wall abnormality. No acute or significant osseous findings. Review of the MIP images confirms the above findings. IMPRESSION: 1. No acute intrathoracic pathology. No CT evidence of central pulmonary artery embolus. 2. Cholelithiasis. Aortic Atherosclerosis (ICD10-I70.0). Electronically Signed   By: AAnner CreteM.D.   On: 08/01/2019 20:55   Ct Hip Right Wo Contrast  Result Date: 07/30/2019 CLINICAL DATA:  Right hip abscess. EXAM: CT OF THE RIGHT HIP WITHOUT CONTRAST TECHNIQUE: Multidetector CT imaging of the right hip was performed according to the standard protocol. Multiplanar CT image reconstructions were also generated. COMPARISON:  None. FINDINGS: Bones/Joint/Cartilage Widening of the right sacroiliac joint with erosive changes most concerning for septic arthritis. Nondisplaced fracture of the inferior aspect of the right sacrum adjacent to the sacroiliac joint. Subchondral fracture involving the right ilium along the right SI joint. Small amount air along the anterior margin of the right sacroiliac joint within the iliopsoas muscle. No acute fracture or dislocation.  Normal  alignment. Ligaments Ligaments are suboptimally evaluated by CT. Soft tissue, Muscles and Tendons Soft tissue wound overlying the right greater trochanter with air within the subcutaneous fat. There has been significant interval improvement in the complex fluid collection involving the right gluteal musculature extending into the pelvis. Small amount air in the left semimembranosus muscle partially visualized. No fluid collection or hematoma.  No soft tissue mass. IMPRESSION: 1. 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 Interval development of a subchondral fracture involving the right ilium and a nondisplaced fracture along the inferior aspect of the right sacrum. 2. Soft  tissue wound overlying the right greater trochanter with air within the subcutaneous fat likely postsurgical. Significant interval improvement in the complex fluid collection involving the right gluteal musculature extending into the pelvis. 3. Small amount air in the left semimembranosus muscle partially visualized most concerning for persistent infection in the absence recent instrumentation involving this area. Electronically Signed   By: Kathreen Devoid   On: 07/30/2019 16:56   Mr Tibia Fibula Right W Wo Contrast  Result Date: 07/26/2019 CLINICAL DATA:  drainage posteromedial R knee, eval for abscess. Contacted by nurse regarding new drainage from posteromedial proximal lower R leg. Started this am Pt notes pain only when touching areaNo other concerns elsewhere Currently on ancef for MSSA from R gluteal/hip abscess EXAM: MRI OF LOWER RIGHT EXTREMITY WITHOUT AND WITH CONTRAST TECHNIQUE: Multiplanar, multisequence MR imaging of the right lower extremity was performed both before and after administration of intravenous contrast. CONTRAST:  11m GADAVIST GADOBUTROL 1 MMOL/ML IV SOLN COMPARISON:  None. FINDINGS: Soft tissues: Large complex fluid collection extends from the thigh across the posterior knee. It begins above the  included field of view and thus is entire extent is not imaged on this exam. The collection terminates in the posterosuperior calf, below the knee. There is peripheral wall enhancement and septal enhancement throughout the collection. Small foci of signal void are noted within the fluid collection consistent with bubbles of air. Transversely, the collection measures a maximum of 11 x 9 cm. The collection is at least 27 cm from superior to inferior, but extends above the included field of view and therefore is longer from superior to inferior. The collection is centered on the hamstrings. The neurovascular structures are displaced anteriorly. There is diffuse surrounding soft tissue edema/cellulitis. Osseous structures: Normal signal. No evidence of edema or osteomyelitis. Right knee joint is normally spaced and aligned. No joint effusion. Visualized tendons are intact, normal in signal. IMPRESSION: 1. Large complex collection consistent with an abscess extending throughout the entire visible posterior thigh, centered on the posterior compartment involving the hamstring musculature. The collection measures 11 x 9 cm transversely. It extends through the visualized thigh across the knee to the upper leg. There is surrounding soft tissue edema/cellulitis. 2. No evidence of osteomyelitis. Knee joint is unremarkable with no effusion to suggest septic arthritis. Electronically Signed   By: DLajean ManesM.D.   On: 07/26/2019 21:17   Vas [place] Extremity Venous (dvt)  Result Date: 08/01/2019  Lower Venous Study Indications: Follow up dvt.  Limitations: Bandages and open wound. Comparison Study: previous study done 9/25 Performing Technologist: MAbram SanderRVS  Examination Guidelines: A complete evaluation includes B-mode imaging, spectral Doppler, color Doppler, and power Doppler as needed of all accessible portions of each vessel. Bilateral testing is considered an integral part of a complete examination. Limited  examinations for reoccurring indications may be performed as noted.  +---------+---------------+---------+-----------+----------+-----------------+ RIGHT    CompressibilityPhasicitySpontaneityPropertiesThrombus Aging    +---------+---------------+---------+-----------+----------+-----------------+ CFV      Full           Yes      Yes                                    +---------+---------------+---------+-----------+----------+-----------------+ SFJ      Full                                                           +---------+---------------+---------+-----------+----------+-----------------+  FV Prox  Full                                                           +---------+---------------+---------+-----------+----------+-----------------+ FV Mid   Full                                                           +---------+---------------+---------+-----------+----------+-----------------+ FV DistalFull                                                           +---------+---------------+---------+-----------+----------+-----------------+ POP                                                   Not visualized    +---------+---------------+---------+-----------+----------+-----------------+ PTV      None                                         Age Indeterminate +---------+---------------+---------+-----------+----------+-----------------+ PERO                                                  Not visualized    +---------+---------------+---------+-----------+----------+-----------------+   +----+---------------+---------+-----------+----------+--------------+ LEFTCompressibilityPhasicitySpontaneityPropertiesThrombus Aging +----+---------------+---------+-----------+----------+--------------+ CFV Full           Yes      Yes                                 +----+---------------+---------+-----------+----------+--------------+     Summary: Right:  Findings consistent with age indeterminate deep vein thrombosis involving the right posterior tibial veins. No cystic structure found in the popliteal fossa. Left: No evidence of common femoral vein obstruction.  *See table(s) above for measurements and observations. Electronically signed by Harold Barban MD on 08/01/2019 at 4:08:15 PM.    Final    Vas [place] Lower Extremity Venous (dvt)  Result Date: 07/28/2019  Lower Venous Study Indications: Edema, abcess right proximal calf, and Pain.  Comparison Study: 07/11/19 RLE negative Performing Technologist: June Leap RDMS, RVT  Examination Guidelines: A complete evaluation includes B-mode imaging, spectral Doppler, color Doppler, and power Doppler as needed of all accessible portions of each vessel. Bilateral testing is considered an integral part of a complete examination. Limited examinations for reoccurring indications may be performed as noted.  +---------+---------------+---------+-----------+----------+--------------+ RIGHT    CompressibilityPhasicitySpontaneityPropertiesThrombus Aging +---------+---------------+---------+-----------+----------+--------------+ CFV      Full           Yes      Yes                                 +---------+---------------+---------+-----------+----------+--------------+  SFJ      Full                                                        +---------+---------------+---------+-----------+----------+--------------+ FV Prox  Full                                                        +---------+---------------+---------+-----------+----------+--------------+ FV Mid   Full                                                        +---------+---------------+---------+-----------+----------+--------------+ FV DistalFull                                                        +---------+---------------+---------+-----------+----------+--------------+ PFV      Full                                                         +---------+---------------+---------+-----------+----------+--------------+ POP                                                   Not visualized +---------+---------------+---------+-----------+----------+--------------+ PTV      None                                         Acute          +---------+---------------+---------+-----------+----------+--------------+ PERO     Full                                                        +---------+---------------+---------+-----------+----------+--------------+   +----+---------------+---------+-----------+----------+--------------+ LEFTCompressibilityPhasicitySpontaneityProperties               +----+---------------+---------+-----------+----------+--------------+ CFV                                              Not visualized +----+---------------+---------+-----------+----------+--------------+     Summary: Right: Findings consistent with acute deep vein thrombosis involving the right posterior tibial veins.  *See table(s) above for measurements and observations. Electronically signed by Curt Jews MD on 07/28/2019 at 4:04:40 AM.    Final    [place] Ekg Site Rite  Result Date:  07/14/2019 If Site Rite image not attached, placement could not be confirmed due to current cardiac rhythm.   (Echo, Carotid, EGD, Colonoscopy, ERCP)    Subjective:   Discharge Exam: Vitals:   08/10/19 0522 08/10/19 1532  BP: 112/66 110/69  Pulse: 91 97  Resp: 17 18  Temp: 98.7 F (37.1 C) 98.4 F (36.9 C)  SpO2: 95% 96%   Vitals:   08/09/19 1625 08/09/19 2220 08/10/19 0522 08/10/19 1532  BP: 113/71 102/66 112/66 110/69  Pulse: 89 92 91 97  Resp: '17 16 17 18  ' Temp: 98.4 F (36.9 C) 98.5 F (36.9 C) 98.7 F (37.1 C) 98.4 F (36.9 C)  TempSrc: Oral Oral Oral Oral  SpO2: 96% 94% 95% 96%  Weight:      Height:        General: Pt is alert, awake, not in acute distress Cardiovascular: RRR, S1/S2 +, no rubs, no  gallops Respiratory: CTA bilaterally, no wheezing, no rhonchi Abdominal: Soft, NT, ND, bowel sounds + Extremities: no edema, no cyanosis    The results of significant diagnostics from this hospitalization (including imaging, microbiology, ancillary and laboratory) are listed below for reference.     Microbiology: Recent Results (from the past 240 hour(s))  Surgical pcr screen     Status: None   Collection Time: 08/08/19  9:22 AM   Specimen: Nasal Mucosa; Nasal Swab  Result Value Ref Range Status   MRSA, PCR NEGATIVE NEGATIVE Final   Staphylococcus aureus NEGATIVE NEGATIVE Final    Comment: (NOTE) The Xpert SA Assay (FDA approved for NASAL specimens in patients 63 years of age and older), is one component of a comprehensive surveillance program. It is not intended to diagnose infection nor to guide or monitor treatment. Performed at [facility], [place] [address] [place], [place] [address]      Labs: BNP (last 3 results) No results for input(s): BNP in the last 8760 hours. Basic Metabolic Panel: Recent Labs  Lab 08/07/19 0425 08/09/19 1850 08/10/19 0440  NA 136 133* 135  K 4.1 3.6 3.8  CL 97* 96* 98  CO2 '28 29 27  ' GLUCOSE 130* 162* 159*  BUN '7 12 9  ' CREATININE 0.59* 0.56* 0.53*  CALCIUM 8.9 8.4* 8.6*   Liver Function Tests: No results for input(s): AST, ALT, ALKPHOS, BILITOT, PROT, ALBUMIN in the last 168 hours. No results for input(s): LIPASE, AMYLASE in the last 168 hours. No results for input(s): AMMONIA in the last 168 hours. CBC: Recent Labs  Lab 08/04/19 0813 08/05/19 0337 08/07/19 0425 08/08/19 0407 08/09/19 1850 08/10/19 0440  WBC 5.4 5.1 6.5 5.2  --  10.0  HGB 7.4* 7.5* 7.6* 7.3* 7.8* 7.5*  HCT 24.4* 25.1* 25.8* 25.1* 24.9* 25.8*  MCV 97.6 99.6 99.6 99.6  --  100.4*  PLT 283 270 259 258  --  252   Cardiac Enzymes: No results for input(s): CKTOTAL, CKMB, CKMBINDEX, TROPONINI in the last 168 hours. BNP: Invalid input(s):  POCBNP CBG: Recent Labs  Lab 08/09/19 1740 08/09/19 2034 08/10/19 0743 08/10/19 1226 08/10/19 1705  GLUCAP 155* 148* 146* 91 157*   D-Dimer No results for input(s): DDIMER in the last 72 hours. Hgb A1c No results for input(s): HGBA1C in the last 72 hours. Lipid Profile No results for input(s): CHOL, HDL, LDLCALC, TRIG, CHOLHDL, LDLDIRECT in the last 72 hours. Thyroid function studies No results for input(s): TSH, T4TOTAL, T3FREE, THYROIDAB in the last 72 hours.  Invalid input(s): FREET3 Anemia work up No  results for input(s): VITAMINB12, FOLATE, FERRITIN, TIBC, IRON, RETICCTPCT in the last 72 hours. Urinalysis    Component Value Date/Time   COLORURINE YELLOW 07/06/2019 [place] 07/06/2019 0437   LABSPEC 1.024 07/06/2019 0437   PHURINE 5.0 07/06/2019 0437   GLUCOSEU >=500 (A) 07/06/2019 0437   HGBUR MODERATE (A) 07/06/2019 0437   BILIRUBINUR NEGATIVE 07/06/2019 0437   KETONESUR 80 (A) 07/06/2019 0437   PROTEINUR NEGATIVE 07/06/2019 0437   NITRITE NEGATIVE 07/06/2019 0437   LEUKOCYTESUR NEGATIVE 07/06/2019 0437   Sepsis Labs Invalid input(s): PROCALCITONIN,  WBC,  LACTICIDVEN Microbiology Recent Results (from the past 240 hour(s))  Surgical pcr screen     Status: None   Collection Time: 08/08/19  9:22 AM   Specimen: Nasal Mucosa; Nasal Swab  Result Value Ref Range Status   MRSA, PCR NEGATIVE NEGATIVE Final   Staphylococcus aureus NEGATIVE NEGATIVE Final    Comment: (NOTE) The Xpert SA Assay (FDA approved for NASAL specimens in patients 4 years of age and older), is one component of a comprehensive surveillance program. It is not intended to diagnose infection nor to guide or monitor treatment. Performed at [facility], [place] [address] [place], [place] [address]      Time coordinating discharge 32 minutes  SIGNED:   Hosie Poisson, MD  [facility] 08/10/2019, 6:00 PM Pager   If 7PM-7AM, please contact  night-coverage [email or web address] Password [email or web address]

## 2019-08-11 LAB — GLUCOSE, CAPILLARY
Glucose-Capillary: 146 mg/dL — ABNORMAL HIGH (ref 70–99)
Glucose-Capillary: 132 mg/dL — ABNORMAL HIGH (ref 70–99)
Glucose-Capillary: 107 mg/dL — ABNORMAL HIGH (ref 70–99)
Glucose-Capillary: 137 mg/dL — ABNORMAL HIGH (ref 70–99)

## 2019-08-11 LAB — CBC
HCT: 25.2 % — ABNORMAL LOW (ref 39.0–52.0)
Hemoglobin: 7.7 g/dL — ABNORMAL LOW (ref 13.0–17.0)
MCH: 29.7 pg (ref 26.0–34.0)
MCHC: 30.6 g/dL (ref 30.0–36.0)
MCV: 97.3 fL (ref 80.0–100.0)
Platelets: 237 10*3/uL (ref 150–400)
RBC: 2.59 MIL/uL — ABNORMAL LOW (ref 4.22–5.81)
RDW: 15.7 % — ABNORMAL HIGH (ref 11.5–15.5)
WBC: 6.9 10*3/uL (ref 4.0–10.5)
nRBC: 0.3 % — ABNORMAL HIGH (ref 0.0–0.2)

## 2019-08-11 NOTE — Progress Notes (Signed)
PROGRESS NOTE    Bradley Cox  [id number] DOB: 05-Sep-1971 DOA: 07/05/2019 PCP: Simona Huh, NP   Brief Narrative:   48 year old gentleman with history of diabetes, essential hypertension, asthma presents with fever was found to have sepsis from MSSA infection with L3-S1 paraspinal abscess, right iliopsoas and right posterior thigh abscesses.  He underwent serial debridement by orthopedics.  Infectious disease consulted and recommended to continue with Ancef till 08/27/2019.  Hospital course complicated by right lower extremity DVT and he was started on Xarelto.  He also underwent repeat I&D on 10/7.  Patient seen and examined at bedside Hemovac was removed yesterday but overnight patient reports that he had a lot of serosanguineous discharge from the wound and his bandage has been completely soaked and he would like to speak with the orthopedics.   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  Patient is afebrile and he had undergone serial debridements of the right thigh abscess by orthopedics.   He also underwent CT-guided drainage of the paraspinal abscess by IR. Infectious disease consulted and recommended to continue with IV Ancef till 08/27/2019.  As patient developed significant output from the incisional wound [facility] orthopedics did a repeat incision and irrigation and drainage and wound closure on 08/08/2019.  Hemovac was removed by orthopedics on 08/10/2019 but overnight patient reports there was a  lot of serosanguineous discharge from the right lower extremity wound and he reports he does not feel comfortable going home and would like to speak with orthopedics.    Anemia of chronic disease/anemia acute blood loss from repeated irrigation and drainage  Transfuse to keep hemoglobin greater than 7.  Patient underwent 2 units of PRBC transfusion so far. Report hemoglobin has been hovering between 7.5-7.8 in view of his persistent serosanguineous discharge from the right lower extremity wound we will repeat H&H today and transfuse to keep it greater than 7.     DVT of the right lower extremities secondary to limited ambulation.  Patient required 2 units of PRBC transfusion already and he was started on Xarelto on 08/04/2019.    DKA Resolved    Essential hypertension  Well-controlled continue with the same regimen.   Asthma No wheezing heard    Obesity Outpatient follow-up with PCP for diet and exercise.  Hypokalemia Replace.  Repeat level within normal limits    Diabetes mellitus  CBG (last 3)  Recent Labs    08/10/19 2110 08/11/19 0750 08/11/19 1211  GLUCAP 162* 146* 132*   I continue with Novolin and 28 units and continue with sliding scale insulin     DVT prophylaxis: Xarelto  code Status: Full code Family Communication: none at bed side today.  Disposition Plan: Possible discharge after orthopedics clears him.   Consultants:   Orthopedics  Procedures: Irrigation and debridement of right thigh abscess with closure on 08/08/2019 by Dr. Doreatha Martin Antimicrobials:[email or web address]  Subjective:  Hemovac was removed yesterday but overnight patient reports that he had a lot of serosanguineous discharge from the wound and his  bandage has been completely soaked and he would like to speak with the orthopedics.  Objective: Vitals:   08/10/19 1532 08/10/19 2112 08/11/19 0509 08/11/19 1420  BP: 110/69 112/75 125/73 111/75  Pulse: 97 (!) 103 (!) 101 (!) 103   Resp: 18 18 16 17   Temp: 98.4 F (36.9 C) 98.3 F (36.8 C) 98.5 F (36.9 C) 98.5 F (36.9 C)  TempSrc: Oral Oral Oral Oral  SpO2: 96% 98% 97% 98%  Weight:      Height:        Intake/Output Summary (Last 24 hours) at 08/11/2019 1544 Last data filed at 08/11/2019 1300 Gross per 24 hour  Intake 610 ml  Output 3800 ml  Net -3190 ml   Filed Weights   07/05/19 1807 07/06/19 2116 07/27/19 1113  Weight: (!) 167.4 kg (!) 164 kg (!) 164 kg    Examination:  General exam: Alert and uncomfortable Respiratory system: Clear to auscultation bilaterally, no wheezing or rhonchi Cardiovascular system: S1 S2 heard, regular rate rhythm, no JVD Gastrointestinal system: Abdomen is soft, nontender, nondistended, bowel sounds are heard Central nervous system: Alert and oriented, grossly nonfocal Extremities: Right lower extremity bandaged Skin: No ulcers or rashes seen Psychiatry: Patient appears anxious    Data Reviewed: I have personally reviewed following labs and imaging studies  CBC: Recent Labs  Lab 08/05/19 0337 08/07/19 0425 08/08/19 0407 08/09/19 1850 08/10/19 0440  WBC 5.1 6.5 5.2  --  10.0  HGB 7.5* 7.6* 7.3* 7.8* 7.5*  HCT 25.1* 25.8* 25.1* 24.9* 25.8*  MCV 99.6 99.6 99.6  --  100.4*  PLT 270 259 258  --  [id number]   Basic Metabolic Panel: Recent Labs  Lab 08/07/19 0425 08/09/19 1850 08/10/19 0440  NA 136 133* 135  K 4.1 3.6 3.8  CL 97* 96* 98  CO2 28 29 27   GLUCOSE 130* 162* 159*  BUN 7 12 9   CREATININE 0.59* 0.56* 0.53*  CALCIUM 8.9 8.4* 8.6*   GFR: Estimated Creatinine Clearance: 188.1 mL/min (A) (by C-G formula based on SCr of 0.53 mg/dL (L)). Liver Function Tests: No results for input(s): AST, ALT, ALKPHOS, BILITOT, PROT, ALBUMIN in the last 168 hours. No results for input(s): LIPASE, AMYLASE in the last 168 hours. No results for input(s): AMMONIA in the last 168 hours. Coagulation Profile: No results for input(s): INR, PROTIME in the last 168 hours.  Cardiac Enzymes: No results for input(s): CKTOTAL, CKMB, CKMBINDEX, TROPONINI in the last 168 hours. BNP (last 3 results) No results for input(s): PROBNP in the last 8760 hours. HbA1C: No results for input(s): HGBA1C in the last 72 hours. CBG: Recent Labs  Lab 08/10/19 1226 08/10/19 1705 08/10/19 2110 08/11/19 0750 08/11/19 1211  GLUCAP 91 157* 162* 146* 132*   Lipid Profile: No results for input(s): CHOL, HDL, LDLCALC, TRIG, CHOLHDL, LDLDIRECT in the last 72 hours. Thyroid Function Tests: No results for input(s): TSH, T4TOTAL, FREET4, T3FREE, THYROIDAB in the last 72 hours. Anemia Panel: No results for input(s): VITAMINB12, FOLATE, FERRITIN, TIBC, IRON, RETICCTPCT in the last 72 hours. Sepsis Labs: No results for input(s): PROCALCITON, LATICACIDVEN in the last 168 hours.  Recent Results (from the past 240 hour(s))  Surgical pcr screen     Status: None   Collection Time: 08/08/19  9:22 AM   Specimen: Nasal Mucosa; Nasal Swab  Result Value Ref Range Status   MRSA, PCR NEGATIVE NEGATIVE Final   Staphylococcus aureus NEGATIVE NEGATIVE Final    Comment: (NOTE) The Xpert SA Assay (FDA  approved for NASAL specimens in patients 64 years of age and older), is one component of a comprehensive surveillance program. It is not intended to diagnose infection nor to guide or monitor treatment. Performed at [facility], [place] [address] [place], [place] [address]          Radiology Studies: No results found.      Scheduled Meds: . sodium chloride   Intravenous Once  . sodium chloride   Intravenous Once  . sodium chloride   Intravenous Once  . acetaminophen  1,000 mg Oral TID  . Chlorhexidine Gluconate Cloth  6 each Topical Daily  . gabapentin  300 mg Oral TID  . hydrALAZINE  25 mg Oral BID  . influenza vac split quadrivalent PF  0.5 mL Intramuscular Tomorrow-1000  . insulin aspart  0-15 Units Subcutaneous TID WC  . insulin aspart  0-5 Units Subcutaneous QHS  .  insulin aspart  8 Units Subcutaneous TID WC  . insulin NPH Human  28 Units Subcutaneous BID AC & HS  . iron polysaccharides  150 mg Oral Daily  . methocarbamol  750 mg Oral TID  . metoCLOPramide  10 mg Oral TID AC & HS  . multivitamin with minerals  1 tablet Oral Daily  . polyethylene glycol  17 g Oral BID  . Ensure Max Protein  11 oz Oral TID BM  . rivaroxaban  15 mg Oral BID   Followed by  . [START ON 08/25/2019] rivaroxaban  20 mg Oral Q supper  . senna-docusate  1 tablet Oral BID  . sodium chloride flush  10-40 mL Intracatheter Q12H   Continuous Infusions: .  ceFAZolin (ANCEF) IV 2 g (08/11/19 0906)     LOS: 36 days       Hosie Poisson, MD [facility] Pager [phone]  If 7PM-7AM, please contact night-coverage [email or web address] Password [email or web address] 08/11/2019, 3:44 PM

## 2019-08-11 NOTE — Progress Notes (Signed)
Ortho Progress Note  Seen at bedside. Noted drainage from wound. Patient frustrated.  Wrapped leg in compressive bandage. Will monitor overnight to see how much drainage occurs.  Will reeval in AM to decide about D/C.  Shona Needles, MD [facility] [phone] (phone) [phone] (office) [email or web address]

## 2019-08-12 LAB — GLUCOSE, CAPILLARY
Glucose-Capillary: 135 mg/dL — ABNORMAL HIGH (ref 70–99)
Glucose-Capillary: 111 mg/dL — ABNORMAL HIGH (ref 70–99)
Glucose-Capillary: 111 mg/dL — ABNORMAL HIGH (ref 70–99)
Glucose-Capillary: 155 mg/dL — ABNORMAL HIGH (ref 70–99)

## 2019-08-12 MED ORDER — HYDROMORPHONE HCL 1 MG/ML IJ SOLN
1.0000 mg | INTRAMUSCULAR | Status: DC | PRN
  Administered 2019-08-12 – 2019-08-14 (×12): 1.5 mg via INTRAVENOUS
  Administered 2019-08-14 – 2019-08-15 (×4): 1 mg via INTRAVENOUS
  Filled 2019-08-12: qty 1
  Filled 2019-08-12 (×6): qty 2
  Filled 2019-08-12 (×2): qty 1
  Filled 2019-08-12 (×4): qty 2
  Filled 2019-08-12: qty 1
  Filled 2019-08-12 (×2): qty 2

## 2019-08-12 MED ORDER — HYDROMORPHONE HCL 1 MG/ML IJ SOLN
1.0000 mg | Freq: Once | INTRAMUSCULAR | Status: AC
  Administered 2019-08-12: 10:00:00 1 mg via INTRAVENOUS
  Filled 2019-08-12: qty 1

## 2019-08-12 NOTE — Progress Notes (Addendum)
Talked to pt about going home on insulin, pt states that his father is diabetic and on insulin.  Pt states he had a meter to check his blood sugar.  He plans on going home with his parents after DC.  Asked Dr. Karleen Hampshire if we could get a diabetic coordinator consult order for education.  Will begin teaching on insulin administration now.  Did some teaching on counting carbs.  In reviewing all of the progress notes, see that Diabetic Coordinator has done teaching and we need to now instruct on insulin administration, nutrition and carb counting prior to DC.

## 2019-08-12 NOTE — Progress Notes (Signed)
PROGRESS NOTE    Bradley Cox  [id number] DOB: 09-Apr-1971 DOA: 07/05/2019 PCP: Simona Huh, NP   Brief Narrative:   48 year old gentleman with history of diabetes, essential hypertension, asthma presents with fever was found to have sepsis from MSSA infection with L3-S1 paraspinal abscess, right iliopsoas and right posterior thigh abscesses.  He underwent serial debridement by orthopedics.  Infectious disease consulted and recommended to continue with Ancef till 08/27/2019.  Hospital course complicated by right lower extremity DVT and he was started on Xarelto.  He also underwent repeat I&D on 10/7.   Patient seen and examined at bedside Hemovac was removed on 9 October but patient reports he had a lot of serosanguineous discharge from the mood and his bandages were soaked.  He has compressions bandages at this time.  Orthopedics has seen the patient and recommended to follow-up tomorrow.  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  Patient is afebrile and he had undergone serial debridements of the right thigh abscess by orthopedics.   He also underwent CT-guided drainage of the paraspinal abscess by IR. Infectious disease consulted and recommended to continue with IV Ancef till 08/27/2019.  As patient developed significant output from the incisional wound [facility] orth[facility]ics did a repeat incision and irrigation and drainage and wound closure on 08/08/2019.  Hemovac was removed by orthopedics on 08/10/2019  but patient reports there was a lot of serosanguineous discharge from the right lower extremity wound.  Requested orthopedics for follow-up.he has compression stockings at this time.     Anemia of chronic disease/anemia acute blood loss from repeated irrigation and drainage  Transfuse to keep hemoglobin greater than 7.  Patient underwent 2 units of PRBC transfusion so far. Repeat hemoglobin at 7.7 and stable.      DVT of the right lower extremities secondary to limited ambulation.   Patient required 2 units of PRBC transfusion already and he was started on Xarelto on 08/04/2019. Continue with xarelto.     DKA Resolved    Essential hypertension  Well controlled.   Asthma No wheezing.     Obesity Outpatient follow-up with PCP for diet and exercise.  Hypokalemia Replaced. Recheck in am.     Diabetes mellitus  CBG (last 3)  Recent Labs    08/11/19 2139 08/12/19 0751 08/12/19 1118  GLUCAP 137* 135* 111*   continue with Novolin and 28 units and continue with sliding scale insulin No changes in meds.  DM co ordinator has been consulted.     DVT prophylaxis: Xarelto  code Status: Full code Family Communication: none at bed side today.  Disposition Plan: Possible discharge after orthopedics clears him.   Consultants:   Orthopedics  Procedures: Irrigation and debridement of right thigh abscess with closure on 08/08/2019 by Dr. Doreatha Martin Antimicrobials:[email or web address]  Subjective:  Pt still very concerned about the pain and sero sanguinous discharge.  No chest pain or sob.    Objective: Vitals:   08/11/19 1420 08/11/19 2023 08/12/19 0407 08/12/19 1509  BP: 111/75 110/63  126/72 112/68  Pulse: (!) 103 96 93 95  Resp: 17 19 18 18   Temp: 98.5 F (36.9 C) 98.8 F (37.1 C) 98.7 F (37.1 C) 98 F (36.7 C)  TempSrc: Oral Oral Oral Oral  SpO2: 98% 98% 97% 95%  Weight:      Height:        Intake/Output Summary (Last 24 hours) at 08/12/2019 1613 Last data  filed at 08/12/2019 1200 Gross per 24 hour  Intake 2525.11 ml  Output 2700 ml  Net -174.89 ml   Filed Weights   07/05/19 1807 07/06/19 2116 07/27/19 1113  Weight: (!) 167.4 kg (!) 164 kg (!) 164 kg    Examination:  General exam: not in distress. Alert  Respiratory system: clear to auscultation, no wheezing or rhonchi.  Cardiovascular system:  S1s2, RRR, no JVD.  Gastrointestinal system:abd is soft , non tender non distended bowel sounds good.  Central nervous system: alert and oriented. Non focal.  Extremities: right lower extremity has compression bandages.  Skin: no rashes seen Psychiatry:pt anxious.     Data Reviewed: I have personally reviewed following labs and imaging studies  CBC: Recent Labs  Lab 08/07/19 0425 08/08/19 0407 08/09/19 1850 08/10/19 0440 08/11/19 1654  WBC 6.5 5.2  --  10.0 6.9  HGB 7.6* 7.3* 7.8* 7.5* 7.7*  HCT 25.8* 25.1* 24.9* 25.8* 25.2*  MCV 99.6 99.6  --  100.4* 97.3  PLT 259 258  --  252 [id number]   Basic Metabolic Panel: Recent Labs  Lab 08/07/19 0425 08/09/19 1850 08/10/19 0440  NA 136 133* 135  K 4.1 3.6 3.8  CL 97* 96* 98  CO2 28 29 27   GLUCOSE 130* 162* 159*  BUN 7 12 9   CREATININE 0.59* 0.56* 0.53*  CALCIUM 8.9 8.4* 8.6*   GFR: Estimated Creatinine Clearance: 188.1 mL/min (A) (by C-G formula based on SCr of 0.53 mg/dL (L)). Liver Function Tests: No results for input(s): AST, ALT, ALKPHOS, BILITOT, PROT, ALBUMIN in the last 168 hours. No results for input(s): LIPASE, AMYLASE in the last 168 hours. No results for input(s): AMMONIA in the last 168 hours. Coagulation Profile: No results for input(s): INR, PROTIME in the last 168 hours. Cardiac Enzymes: No results for input(s): CKTOTAL, CKMB, CKMBINDEX, TROPONINI in the last 168 hours. BNP (last 3 results) No results for input(s): PROBNP in the last 8760 hours. HbA1C: No results for input(s): HGBA1C in the last 72 hours. CBG: Recent Labs  Lab 08/11/19 1211 08/11/19 1718  08/11/19 2139 08/12/19 0751 08/12/19 1118  GLUCAP 132* 107* 137* 135* 111*   Lipid Profile: No results for input(s): CHOL, HDL, LDLCALC, TRIG, CHOLHDL, LDLDIRECT in the last 72 hours. Thyroid Function Tests: No results for input(s): TSH, T4TOTAL, FREET4, T3FREE, THYROIDAB in the last 72 hours. Anemia Panel: No results for input(s): VITAMINB12, FOLATE, FERRITIN, TIBC, IRON, RETICCTPCT in the last 72 hours. Sepsis Labs: No results for input(s): PROCALCITON, LATICACIDVEN in the last 168 hours.  Recent Results (from the past 240 hour(s))  Surgical pcr screen     Status: None   Collection Time: 08/08/19  9:22 AM   Specimen: Nasal Mucosa; Nasal Swab  Result Value Ref Range Status   MRSA, PCR NEGATIVE NEGATIVE Final   Staphylococcus aureus NEGATIVE NEGATIVE Final    Comment: (NOTE) The Xpert SA Assay (FDA approved for NASAL specimens in patients 9 years of age and older), is one component of a comprehensive surveillance program. It is not intended to diagnose infection nor to guide  or monitor treatment. Performed at [facility], [place] [address], [place], [place] [address]          Radiology Studies: No results found.      Scheduled Meds: . sodium chloride   Intravenous Once  . sodium chloride   Intravenous Once  . sodium chloride   Intravenous Once  . acetaminophen  1,000 mg Oral TID  . Chlorhexidine Gluconate Cloth  6 each Topical Daily  . gabapentin  300 mg Oral TID  . hydrALAZINE  25 mg Oral BID  . influenza vac split quadrivalent PF  0.5 mL Intramuscular Tomorrow-1000  . insulin aspart  0-15 Units Subcutaneous TID WC  . insulin aspart  0-5 Units Subcutaneous QHS  . insulin aspart  8 Units Subcutaneous TID WC  . insulin NPH Human  28 Units Subcutaneous BID AC & HS  . iron polysaccharides  150 mg Oral Daily  . methocarbamol  750 mg Oral TID  . metoCLOPramide  10 mg Oral TID AC & HS  . multivitamin with minerals  1 tablet Oral Daily  . polyethylene glycol   17 g Oral BID  . Ensure Max Protein  11 oz Oral TID BM  . rivaroxaban  15 mg Oral BID   Followed by  . [START ON 08/25/2019] rivaroxaban  20 mg Oral Q supper  . senna-docusate  1 tablet Oral BID  . sodium chloride flush  10-40 mL Intracatheter Q12H   Continuous Infusions: .  ceFAZolin (ANCEF) IV 2 g (08/12/19 1023)     LOS: 52 days       Hosie Poisson, MD [facility] Pager [phone]  If 7PM-7AM, please contact night-coverage [email or web address] Password [email or web address] 08/12/2019, 4:13 PM

## 2019-08-12 NOTE — Progress Notes (Signed)
Ortho Trauma Note  Having increased pain in thigh above compressive ACE wrap. Increased drainage from hip.  ACE wraps taken down and minimal drainage from posterior thigh wound. Knot in lateral thigh/vastus lateralis that is significantly tender to touch. Hip dressing in place.  Rewrapped leg from foot to mid thigh as improved swelling significantly. Will give extra dose of dilaudid. I believe abscess was so large and connected with hip and thigh that dead space is causing signficant drainage. Will continue to eval. Do not feel pain is well enough controlled at this time to DC today.  Shona Needles, MD [facility] [phone] (phone) [phone] (office) [email or web address]

## 2019-08-12 NOTE — Progress Notes (Signed)
Placed bilateral Prevalon boots on pt for BLE to prevent any pressure injury on heels.  Also, obtained a pressure redistribution pad for pt for when he is up in the chair.

## 2019-08-13 LAB — BASIC METABOLIC PANEL
Anion gap: 8 (ref 5–15)
BUN: 7 mg/dL (ref 6–20)
CO2: 29 mmol/L (ref 22–32)
Calcium: 8.5 mg/dL — ABNORMAL LOW (ref 8.9–10.3)
Chloride: 99 mmol/L (ref 98–111)
Creatinine, Ser: 0.67 mg/dL (ref 0.61–1.24)
GFR calc Af Amer: >60 mL/min (ref >60)
GFR calc non Af Amer: >60 mL/min (ref >60)
Glucose, Bld: 152 mg/dL — ABNORMAL HIGH (ref 70–99)
Potassium: 3.8 mmol/L (ref 3.5–5.1)
Sodium: 136 mmol/L (ref 135–145)

## 2019-08-13 LAB — CBC
HCT: 26.1 % — ABNORMAL LOW (ref 39.0–52.0)
Hemoglobin: 7.6 g/dL — ABNORMAL LOW (ref 13.0–17.0)
MCH: 28.6 pg (ref 26.0–34.0)
MCHC: 29.1 g/dL — ABNORMAL LOW (ref 30.0–36.0)
MCV: 98.1 fL (ref 80.0–100.0)
Platelets: 240 10*3/uL (ref 150–400)
RBC: 2.66 MIL/uL — ABNORMAL LOW (ref 4.22–5.81)
RDW: 15 % (ref 11.5–15.5)
WBC: 5.5 10*3/uL (ref 4.0–10.5)
nRBC: 0 % (ref 0.0–0.2)

## 2019-08-13 LAB — GLUCOSE, CAPILLARY
Glucose-Capillary: 138 mg/dL — ABNORMAL HIGH (ref 70–99)
Glucose-Capillary: 126 mg/dL — ABNORMAL HIGH (ref 70–99)
Glucose-Capillary: 104 mg/dL — ABNORMAL HIGH (ref 70–99)
Glucose-Capillary: 131 mg/dL — ABNORMAL HIGH (ref 70–99)

## 2019-08-13 NOTE — Progress Notes (Signed)
PROGRESS NOTE    Bradley Cox  [id number] DOB: 1971-06-06 DOA: 07/05/2019 PCP: Simona Huh, NP   Brief Narrative:   48 year old gentleman with history of diabetes, essential hypertension, asthma presents with fever was found to have sepsis from MSSA infection with L3-S1 paraspinal abscess, right iliopsoas and right posterior thigh abscesses.  He underwent serial debridement by orthopedics.  Infectious disease consulted and recommended to continue with Ancef till 08/27/2019.  Hospital course complicated by right lower extremity DVT and he was started on Xarelto.  He also underwent repeat I&D on 10/7.  Pt seen and examined at bedside, hemo vac was removed on 08/10/2019/  But he had a lot of sero sanguineous discharge from the wound, the bandages were soaked.   Orthopedics were requested to re evaluate the patient and he had compression bandages on the right lower extremity .on exam today the compression bandages are also soaked. Pt reports increased pain and we have increased dilaudid from 1 mg to 1.5 mg every 3 hours prn.    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  Patient is afebrile and he had undergone serial debridements of the right thigh abscess by orthopedics.   He also underwent CT-guided drainage of the paraspinal abscess by IR. Infectious disease consulted and recommended to continue with IV Ancef till 08/27/2019.  As patient developed significant output from the incisional wound VAC  orthopedics did a repeat incision and irrigation and drainage and wound closure on 08/08/2019.  Hemovac was removed by orthopedics on 08/10/2019 but patient reports worsening of lot of serosanguineous discharge from the right lower extremity wound.   Appreciate ortho follow up.  He currently has compression bandages on hir right LL.   Anemia of chronic disease/anemia acute blood loss from repeated irrigation and drainage Transfuse to keep hemoglobin greater than 7.  Patient underwent 2 units of PRBC transfusion so far. Repeat hemoglobin at 7.7 and stable.  Repeat CBC in am.   DVT of the right lower extremities secondary to limited ambulation.   Patient required 2 units of PRBC transfusion already and he was started on Xarelto on 08/04/2019. Continue with xarelto.  No big drop of hemoglobin /     DKA Resolved.   Essential hypertension  Well controlled.   Asthma No wheezing , good sats on RA.    Obesity Outpatient follow-up with PCP for diet and exercise.  Hypokalemia Replaced.     Diabetes mellitus  CBG (last 3)  Recent Labs    08/13/19 0807 08/13/19 1234 08/13/19 1718  GLUCAP 138* 126* 104*   Resume NOVOLIN N. And SSI.    Constipation:  Resolved.  BM this am.    DVT prophylaxis: Xarelto  code Status: Full code Family Communication: none at bed side today.  Disposition Plan: Possible discharge after orthopedics clears him.   Consultants:   Orthopedics  Procedures: Irrigation and debridement of right thigh abscess with closure on 08/08/2019 by Dr. Doreatha Martin Antimicrobials:cefazolin  Subjective: Pt continues to report increase serosanguinous discharge  from the wound despite compression bandages.  No chest pain or sob, no nausea or vomiting.    Objective: Vitals:   08/12/19 0407 08/12/19 1509 08/12/19 2130 08/13/19 0540  BP: 126/72 112/68 104/66 115/75  Pulse: 93 95 92 86  Resp: 18 18 18 18   Temp: 98.7 F (37.1 C) 98 F (36.7 C) 98.7 F (37.1 C) 98.8  F (37.1 C)  TempSrc: Oral Oral Oral Oral  SpO2: 97% 95% 96% 96%  Weight:      Height:        Intake/Output Summary (Last 24 hours) at 08/13/2019 1818 Last data filed at 08/13/2019 1810 Gross per 24 hour  Intake 660 ml  Output 2300 ml  Net -1640 ml   Filed Weights   07/05/19 1807 07/06/19 2116 07/27/19 1113  Weight: (!) 167.4 kg (!) 164 kg (!) 164 kg    Examination:  General exam: alert and not in distress.  Respiratory system: air entry fair, no wheezing or rhonchi.  Cardiovascular system:  s1s2 RRR, no JVD.  Gastrointestinal system: abd is soft, NT ND BS+ Central nervous system:  Alert and oriented. Non focal  Extremities: right lower extremity with compression bandages.   Skin: no rashes seen.  Psychiatry: mood is appropriate.     Data Reviewed: I have personally reviewed following labs and imaging studies  CBC: Recent Labs  Lab 08/07/19 0425 08/08/19 0407 08/09/19 1850 08/10/19 0440 08/11/19 1654  WBC 6.5 5.2  --  10.0 6.9  HGB 7.6* 7.3* 7.8* 7.5* 7.7*  HCT 25.8* 25.1* 24.9* 25.8* 25.2*  MCV 99.6 99.6  --  100.4* 97.3  PLT 259 258  --  252 [id number]   Basic Metabolic Panel: Recent Labs  Lab 08/07/19 0425 08/09/19 1850 08/10/19 0440  NA 136 133* 135  K 4.1 3.6 3.8  CL 97* 96* 98  CO2 28 29 27   GLUCOSE 130* 162* 159*  BUN 7 12 9   CREATININE 0.59* 0.56* 0.53*  CALCIUM 8.9 8.4* 8.6*   GFR: Estimated Creatinine Clearance: 188.1 mL/min (A) (by C-G formula based on SCr of 0.53 mg/dL (L)). Liver Function Tests: No results for input(s): AST, ALT, ALKPHOS, BILITOT, PROT, ALBUMIN in the last 168 hours. No results for input(s): LIPASE, AMYLASE in the last 168 hours. No results for input(s): AMMONIA in the last 168 hours. Coagulation Profile: No results for input(s): INR, PROTIME in the last 168 hours. Cardiac Enzymes: No results for input(s): CKTOTAL, CKMB, CKMBINDEX, TROPONINI in the last 168 hours. BNP (last 3 results) No results for input(s): PROBNP in  the last 8760 hours. HbA1C: No results for input(s): HGBA1C in the last 72 hours. CBG: Recent Labs  Lab 08/12/19 1714 08/12/19 2133 08/13/19 0807 08/13/19 1234 08/13/19 1718  GLUCAP 111* 155* 138* 126* 104*   Lipid Profile: No results for input(s): CHOL, HDL, LDLCALC, TRIG, CHOLHDL, LDLDIRECT in the last 72 hours. Thyroid Function Tests: No results for input(s): TSH, T4TOTAL, FREET4, T3FREE, THYROIDAB in the last 72 hours. Anemia Panel: No results for input(s): VITAMINB12, FOLATE, FERRITIN, TIBC, IRON, RETICCTPCT in the last 72 hours. Sepsis Labs: No results for input(s): PROCALCITON, LATICACIDVEN in the last 168 hours.  Recent Results (from the past 240 hour(s))  Surgical pcr screen     Status: None   Collection Time: 08/08/19  9:22 AM   Specimen: Nasal Mucosa; Nasal Swab  Result Value Ref Range Status   MRSA, PCR NEGATIVE NEGATIVE Final   Staphylococcus aureus NEGATIVE NEGATIVE Final    Comment: (NOTE)  The Xpert SA Assay (FDA approved for NASAL specimens in patients 57 years of age and older), is one component of a comprehensive surveillance program. It is not intended to diagnose infection nor to guide or monitor treatment. Performed at [facility], [place] [address], [place], [place] [address]          Radiology Studies: No results found.      Scheduled Meds: . acetaminophen  1,000 mg Oral TID  . Chlorhexidine Gluconate Cloth  6 each Topical Daily  . gabapentin  300 mg Oral TID  . hydrALAZINE  25 mg Oral BID  . influenza vac split quadrivalent PF  0.5 mL Intramuscular Tomorrow-1000  . insulin aspart  0-15 Units Subcutaneous TID WC  . insulin aspart  0-5 Units Subcutaneous QHS  . insulin aspart  8 Units Subcutaneous TID WC  . insulin NPH Human  28 Units Subcutaneous BID AC & HS  . iron polysaccharides  150 mg Oral Daily  . methocarbamol  750 mg Oral TID  . metoCLOPramide  10 mg Oral TID AC & HS  . multivitamin with minerals  1 tablet Oral Daily   . polyethylene glycol  17 g Oral BID  . Ensure Max Protein  11 oz Oral TID BM  . rivaroxaban  15 mg Oral BID   Followed by  . [START ON 08/25/2019] rivaroxaban  20 mg Oral Q supper  . senna-docusate  1 tablet Oral BID  . sodium chloride flush  10-40 mL Intracatheter Q12H   Continuous Infusions: .  ceFAZolin (ANCEF) IV 2 g (08/13/19 1810)     LOS: 38 days       Hosie Poisson, MD [facility] Pager [phone]  If 7PM-7AM, please contact night-coverage [email or web address] Password [email or web address] 08/13/2019, 6:18 PM

## 2019-08-13 NOTE — Progress Notes (Signed)
Inpatient Diabetes Program Recommendations  AACE/ADA: New Consensus Statement on Inpatient Glycemic Control (2015)  Target Ranges:  Prepandial:   less than 140 mg/dL      Peak postprandial:   less than 180 mg/dL (1-2 hours)      Critically ill patients:  140 - 180 mg/dL   Lab Results  Component Value Date   GLUCAP 126 (H) 08/13/2019   HGBA1C 12.6 (H) 07/07/2019    Review of Glycemic Control  Pt will be discharged on 10/13, going home on insulin. Will need affordable insulin option.  Novolin N 28 units bid (am and hs) Humulin R 8 units tidwc for meal coverage. Humulin R 0-15 units tidwc and 0-5 units QHS.  Will f/u with pt in am prior to discharge.  Secure text to RN to allow pt to give his own insulin injections. RN to also pass info to night shift RN.  Thank you. Lorenda Peck, RD, LDN, CDE Inpatient Diabetes Coordinator [phone]

## 2019-08-13 NOTE — Progress Notes (Signed)
Ortho Trauma Note  Compressive wrap to leg has helped with swelling and edema around knee and leg. His hip continues to weep with serosang fluid. His hip is edematous likely from anticoagulation and previous infection. Will reach out to therapist to discuss possible compressive hip spica wrap for his hip wound to see if we can get to stop draining.  Shona Needles, MD [facility] [phone] (phone) [phone] (office) [email or web address]

## 2019-08-13 NOTE — Progress Notes (Signed)
Occupational Therapy Treatment Patient Details Name: Bradley Cox MRN: [id number] DOB: 09/11/1971 Today's Date: 08/13/2019    History of present illness Patient is a 48 y/o male who presents with right flank pain. Admitted with disseminated MSSA infection including bacteremia, Lumbosacral spinal epidural abscess, paraspinal/large gluteal/pelvic abscess with sciatica impingement. s/p I&D large gluteal abscess and neuroplasty of sciatica nerve 9/7. He has received multiple R LE I&Ds and wound vac placements/changes on 9/15, 9/18, 9/25, 9/28, and 10/7. Wound vac removed/hemovac placed 10/7.  s/p CT aspiration right paraspinal abscess. PMH includes DM, HTN, morbid obesity, OSA.   OT comments  Focus of session on compression wrapping RLE in "hip spica" formation to reduce upper thigh edema. Dressing behind knee saturated/changed - nsg made aware. Pt stood at bedside while therapist completed wrap. Will follow up in am to rewrap and further assess. Will continue to follow.   Follow Up Recommendations  Home health OT;Supervision/Assistance - 24 hour    Equipment Recommendations  3 in 1 bedside commode    Recommendations for Other Services PT consult    Precautions / Restrictions Precautions Precautions: Fall       Mobility Bed Mobility Overal bed mobility: Needs Assistance Bed Mobility: Supine to Sit;Sit to Supine     Supine to sit: Supervision Sit to supine: Min assist(A to lift RLE onto bed)      Transfers Overall transfer level: Needs assistance   Transfers: Sit to/from Stand Sit to Stand: From elevated surface;Min guard              Balance     Sitting balance-Leahy Scale: Good       Standing balance-Leahy Scale: Poor Standing balance comment: reliant on external support.  I almost fell with nursing"                           ADL either performed or assessed with clinical judgement   ADL       Grooming: Set up   Upper Body Bathing: Set  up;Bed level   Lower Body Bathing: Moderate assistance;Bed level   Upper Body Dressing : Set up;Sitting   Lower Body Dressing: Moderate assistance;Bed level   Toilet Transfer: Minimal assistance;RW;Ambulation   Toileting- Clothing Manipulation and Hygiene: Maximal assistance       Functional mobility during ADLs: Minimal assistance;Rolling walker;Cueing for safety       Vision       Perception     Praxis      Cognition Arousal/Alertness: Awake/alert Behavior During Therapy: WFL for tasks assessed/performed Overall Cognitive Status: Within Functional Limits for tasks assessed                                          Exercises     Shoulder Instructions       General Comments RLE compression wrapped over thigh and around back/lower abdomento secure wrap. No reports of discomfort form pt. Dressing behind knee was saturated and changed. Nsg aware.     Pertinent Vitals/ Pain       Pain Assessment: Faces Faces Pain Scale: Hurts little more Pain Location: R LE with movement Pain Descriptors / Indicators: Aching;Sore;Discomfort;Grimacing;Guarding Pain Intervention(s): Limited activity within patient's tolerance  Home Living  Prior Functioning/Environment              Frequency  Min 3X/week        Progress Toward Goals  OT Goals(current goals can now be found in the care plan section)  Progress towards OT goals: Progressing toward goals  Acute Rehab OT Goals Patient Stated Goal: keep getting better OT Goal Formulation: With patient Time For Goal Achievement: 08/27/19 Potential to Achieve Goals: Good ADL Goals Pt Will Perform Grooming: with modified independence;sitting Pt Will Perform Upper Body Dressing: with modified independence;sitting Pt Will Perform Lower Body Dressing: with min guard assist;with adaptive equipment;sit to/from stand;sitting/lateral leans Pt Will  Transfer to Toilet: with min guard assist;ambulating;bedside commode Pt Will Perform Toileting - Clothing Manipulation and hygiene: with min guard assist;sitting/lateral leans;sit to/from stand Additional ADL Goal #1: Pt will perform bed mobility with Min Guard A in preparation for ADLs.  Plan Discharge plan remains appropriate    Co-evaluation                 AM-PAC OT "6 Clicks" Daily Activity     Outcome Measure   Help from another person eating meals?: None Help from another person taking care of personal grooming?: A Little Help from another person toileting, which includes using toliet, bedpan, or urinal?: A Little Help from another person bathing (including washing, rinsing, drying)?: A Lot Help from another person to put on and taking off regular upper body clothing?: A Little Help from another person to put on and taking off regular lower body clothing?: A Lot 6 Click Score: 17    End of Session Equipment Utilized During Treatment: Rolling walker  OT Visit Diagnosis: Unsteadiness on feet (R26.81);Other abnormalities of gait and mobility (R26.89);Muscle weakness (generalized) (M62.81);Other symptoms and signs involving cognitive function;Pain Pain - Right/Left: Right Pain - part of body: Leg   Activity Tolerance Patient tolerated treatment well   Patient Left in bed;with call bell/phone within reach   Nurse Communication Other (comment)(Use of compression wrap)        Time: [id number] OT Time Calculation (min): 43 min  Charges: OT General Charges $OT Visit: 1 Visit OT Treatments $Therapeutic Activity: 38-52 mins  Maurie Boettcher, OT/L   Acute OT Clinical Specialist [place] Pager [phone] Office [phone]    [facility] 08/13/2019, 2:37 PM

## 2019-08-13 NOTE — Progress Notes (Signed)
Physical Therapy Treatment Patient Details Name: Bradley Cox MRN: [id number] DOB: [birth year] Today's Date: 08/13/2019    History of Present Illness Patient is a 48 y/o male who presents with right flank pain. Admitted with disseminated MSSA infection including bacteremia, Lumbosacral spinal epidural abscess, paraspinal/large gluteal/pelvic abscess with sciatica impingement. s/p I&D large gluteal abscess and neuroplasty of sciatica nerve 9/7. He has received multiple R LE I&Ds and wound vac placements/changes on 9/15, 9/18, 9/25, 9/28, and 10/7. Wound vac removed/hemovac placed 10/7.  s/p CT aspiration right paraspinal abscess. PMH includes DM, HTN, morbid obesity, OSA.    PT Comments    Patient received in bed, willing to work on bed exercises but politely declining OOB activities due to being heavily fatigued after OT session. Worked on functional exercises in bed with assist as appropriate due to pain. Reports that he has almost had multiple falls with nursing.  He was left in bed with all needs met this afternoon.     Follow Up Recommendations  CIR;Supervision/Assistance - 24 hour;Other (comment)(HHPT and 24/7 assist if CIR is denied)     Equipment Recommendations  3in1 (PT);Rolling walker with 5" wheels    Recommendations for Other Services       Precautions / Restrictions Precautions Precautions: Fall Precaution Comments: very sensitive/wound present R posterior thigh, check BPs Restrictions Weight Bearing Restrictions: No RLE Weight Bearing: Weight bearing as tolerated    Mobility  Bed Mobility Overal bed mobility: Needs Assistance Bed Mobility: Supine to Sit;Sit to Supine     Supine to sit: Supervision Sit to supine: Min assist(A to lift RLE onto bed)   General bed mobility comments: focus on exercise  Transfers Overall transfer level: Needs assistance   Transfers: Sit to/from Stand Sit to Stand: From elevated surface;Min guard         General transfer  comment: focus on exercise  Ambulation/Gait             General Gait Details: focus on exercise   Stairs             Wheelchair Mobility    Modified Rankin (Stroke Patients Only)       Balance Overall balance assessment: Needs assistance Sitting-balance support: No upper extremity supported;Feet supported Sitting balance-Leahy Scale: Good Sitting balance - Comments: able to sit without UE assist at EOB   Standing balance support: Bilateral upper extremity supported;During functional activity Standing balance-Leahy Scale: Poor Standing balance comment: reliant on external support.  I almost fell with nursing"                            Cognition Arousal/Alertness: Awake/alert Behavior During Therapy: WFL for tasks assessed/performed Overall Cognitive Status: Within Functional Limits for tasks assessed                                 General Comments: frustrated about length of hospital stay and still being admitted      Exercises      General Comments General comments (skin integrity, edema, etc.): RLE compression wrapped over thigh and around back/lower abdomento secure wrap. No reports of discomfort form pt. Dressing behind knee was saturated and changed. Nsg aware.       Pertinent Vitals/Pain Pain Assessment: Faces Faces Pain Scale: Hurts whole lot Pain Location: R LE with movement Pain Descriptors / Indicators: Aching;Sore;Discomfort;Grimacing;Guarding Pain Intervention(s): Limited activity within patient's tolerance;Monitored during  session    Home Living                      Prior Function            PT Goals (current goals can now be found in the care plan section) Acute Rehab PT Goals Patient Stated Goal: keep getting better PT Goal Formulation: With patient Time For Goal Achievement: 08/14/19 Potential to Achieve Goals: Good Progress towards PT goals: Progressing toward goals    Frequency    Min  3X/week      PT Plan Equipment recommendations need to be updated    Co-evaluation              AM-PAC PT "6 Clicks" Mobility   Outcome Measure  Help needed turning from your back to your side while in a flat bed without using bedrails?: None Help needed moving from lying on your back to sitting on the side of a flat bed without using bedrails?: A Little Help needed moving to and from a bed to a chair (including a wheelchair)?: A Little Help needed standing up from a chair using your arms (e.g., wheelchair or bedside chair)?: A Little Help needed to walk in hospital room?: A Little Help needed climbing 3-5 steps with a railing? : A Lot 6 Click Score: 18    End of Session   Activity Tolerance: Patient tolerated treatment well;Patient limited by pain Patient left: in bed;with call bell/phone within reach   PT Visit Diagnosis: Other abnormalities of gait and mobility (R26.89);Muscle weakness (generalized) (M62.81);Pain;Difficulty in walking, not elsewhere classified (R26.2) Pain - Right/Left: Right Pain - part of body: Leg;Hip     Time: 1450-1515 PT Time Calculation (min) (ACUTE ONLY): 25 min  Charges:  $Therapeutic Exercise: 23-37 mins                     Deniece Ree PT, DPT, CBIS  Supplemental Physical Therapist [place]    Pager [phone] Acute Rehab Office [phone]

## 2019-08-14 LAB — CBC
HCT: 25.2 % — ABNORMAL LOW (ref 39.0–52.0)
Hemoglobin: 7.6 g/dL — ABNORMAL LOW (ref 13.0–17.0)
MCH: 29.6 pg (ref 26.0–34.0)
MCHC: 30.2 g/dL (ref 30.0–36.0)
MCV: 98.1 fL (ref 80.0–100.0)
Platelets: 219 10*3/uL (ref 150–400)
RBC: 2.57 MIL/uL — ABNORMAL LOW (ref 4.22–5.81)
RDW: 15.3 % (ref 11.5–15.5)
WBC: 5.2 10*3/uL (ref 4.0–10.5)
nRBC: 0.4 % — ABNORMAL HIGH (ref 0.0–0.2)

## 2019-08-14 LAB — BASIC METABOLIC PANEL
Anion gap: 12 (ref 5–15)
BUN: 8 mg/dL (ref 6–20)
CO2: 28 mmol/L (ref 22–32)
Calcium: 8.8 mg/dL — ABNORMAL LOW (ref 8.9–10.3)
Chloride: 97 mmol/L — ABNORMAL LOW (ref 98–111)
Creatinine, Ser: 0.53 mg/dL — ABNORMAL LOW (ref 0.61–1.24)
GFR calc Af Amer: >60 mL/min (ref >60)
GFR calc non Af Amer: >60 mL/min (ref >60)
Glucose, Bld: 115 mg/dL — ABNORMAL HIGH (ref 70–99)
Potassium: 3.9 mmol/L (ref 3.5–5.1)
Sodium: 137 mmol/L (ref 135–145)

## 2019-08-14 LAB — GLUCOSE, CAPILLARY
Glucose-Capillary: 145 mg/dL — ABNORMAL HIGH (ref 70–99)
Glucose-Capillary: 137 mg/dL — ABNORMAL HIGH (ref 70–99)
Glucose-Capillary: 112 mg/dL — ABNORMAL HIGH (ref 70–99)
Glucose-Capillary: 135 mg/dL — ABNORMAL HIGH (ref 70–99)

## 2019-08-14 NOTE — Progress Notes (Signed)
Nutrition Follow-up  RD working remotely.  DOCUMENTATION CODES:   Morbid obesity  INTERVENTION:   -Continue MVI with minerals daily -Continue Ensure Max po TID, each supplement provides 150 kcal and 30 grams of protein -Continue Double protein portions with meals  NUTRITION DIAGNOSIS:   Increased nutrient needs related to post-op healing as evidenced by estimated needs.  Ongoing  GOAL:   Patient will meet greater than or equal to 90% of their needs  Progressing   MONITOR:   PO intake, Supplement acceptance, Diet advancement, Labs, Weight trends, Skin, I & O's  REASON FOR ASSESSMENT:   LOS    ASSESSMENT:   Bradley Cox is a 48 y.o. male with medical history significant of hypertension, diabetes mellitus type 2, asthma, and arthritis; who presents with complaints of right flank pain.  History is obtained from the patient and his mother who is present at bedside.  Patient reports symptoms started approximately 3 weeks ago after initially being seen in urgent care for kidney stones.  He was given a shot of Toradol in his right buttock on a steroid Dosepak.  However, instead of symptoms getting better and they got worse.  He reports swelling and pain in right flank radiating down his leg.  He has been unable to walk due to symptoms.  Reports that his diabetes is currently managed with metformin and he has not been on insulin.  He does not know what his last hemoglobin A1c was.  9/4- s/p CT aspiration of right paraspinal abscess (15 ml brown fluid removed) 9/9- s/p Procedures: Incision and drainage of right hip/pelvis abscess and Neuroplasty of right sciatic nerve 9/9- s/p TEE- low suspicion for endocarditis 9/9- NGT placed due to nausea and vomiting 9/10- NGT d/c 9/12- PICC placed 9/15- s/p Procedure(s):INCISION AND DRAINAGE OF RIGHT DEEP ABSCESSHIP (Right); INSERTION OF ANTIBIOTIC BEADS VANC AND GENT; Application Of Wound Vac (Right) 9/18- s/p Procedure(s): Irrigation  And Debridement Hip (Right)with sharp excision of subcutaneous fat and deep fascia; Application Of Wound Vac (Right) 9/25- s/p Procedures: IIncision and drainage of right posterior thigh abscess and Wound vac placement 9/28- s/p Procedures: Irrigation and debridement of right thigh abscess and Incisional wound vac placement  10/7- s/p Irrigation and debridement of right thigh abscess with closure  Pt now with compressive bandages and dressing changes, with significant drainage. Pain control is also an issue, per orthopedics.   Pt remains with good appetite; noted meal completion 50-100%. Pt is compliant with Ensure Max supplements.  CIR following for discharge disposition. .   Labs reviewed: CBGS: 131-145 (inpatient orders for glycemic control are 0-15 units insulin aspart TID with meals, 0-5 units insulin aspart q HS, 8 units insulin aspart TID with meals, and 28 units insulin NPH BID).   Diet Order:   Diet Order            Diet Carb Modified Fluid consistency: Thin; Room service appropriate? Yes  Diet effective now              EDUCATION NEEDS:   Not appropriate for education at this time  Skin:  Skin Assessment: Skin Integrity Issues: Skin Integrity Issues:: Incisions Wound Vac: d/c Incisions: rt knee and rt leg incision  Last BM:  08/13/19  Height:   Ht Readings from Last 1 Encounters:  07/27/19 6\' 6"  (1.981 m)    Weight:   Wt Readings from Last 1 Encounters:  07/27/19 (!) 164 kg    Ideal Body Weight:  97.3 kg  BMI:  Body mass index is 41.78 kg/m.  Estimated Nutritional Needs:   Kcal:  2400-2700  Protein:  160-190 grams  Fluid:  2.4 L    Bradley Cox, RD, LDN, [place] Registered Dietitian II Certified Diabetes Care and Education Specialist Pager: [phone] After hours Pager: [phone]

## 2019-08-14 NOTE — Progress Notes (Signed)
Physical Therapy Treatment Patient Details Name: Bradley Cox MRN: [id number] DOB: 1971-05-13 Today's Date: 08/14/2019    History of Present Illness Patient is a 48 y/o male who presents with right flank pain. Admitted with disseminated MSSA infection including bacteremia, Lumbosacral spinal epidural abscess, paraspinal/large gluteal/pelvic abscess with sciatica impingement. s/p I&D large gluteal abscess and neuroplasty of sciatica nerve 9/7. He has received multiple R LE I&Ds and wound vac placements/changes on 9/15, 9/18, 9/25, 9/28, and 10/7. Wound vac removed/hemovac placed 10/7.  s/p CT aspiration right paraspinal abscess. PMH includes DM, HTN, morbid obesity, OSA.    PT Comments    Patient received in bed, very pleasant but remains very frustrated about his hospital stay and discharge options. Able to complete supine to sit with HOB elevated and min guard, sit to stand with RW and min guard/extended time, tolerated gait training in room 1f with RW and extended time- did experience dizziness and R LE weakness/shaking with gait, very fatigued at EOS. Min guard x2 for equipment management and safety throughout session. Discussed attempt to refer to SNF prior to returning home, as well as general safety considerations for return home if direct return is what ends up happening. He was left in bed with all needs met, all questions/concerns addressed.     Follow Up Recommendations  SNF;Supervision/Assistance - 24 hour     Equipment Recommendations  3in1 (PT);Rolling walker with 5" wheels;Wheelchair (measurements PT);Wheelchair cushion (measurements PT)    Recommendations for Other Services       Precautions / Restrictions Precautions Precautions: Fall Precaution Comments: very sensitive/wound present R posterior thigh, check BPs Restrictions Weight Bearing Restrictions: No RLE Weight Bearing: Weight bearing as tolerated    Mobility  Bed Mobility Overal bed mobility: Needs  Assistance Bed Mobility: Supine to Sit;Sit to Supine     Supine to sit: Min guard;HOB elevated Sit to supine: Min assist;HOB elevated(R LE management)   General bed mobility comments: minimal assist for R LE management back to bed  Transfers Overall transfer level: Needs assistance Equipment used: Rolling walker (2 wheeled) Transfers: Sit to/from Stand Sit to Stand: From elevated surface;Min guard         General transfer comment: min guard x2 for safety, rocking and extended time  Ambulation/Gait Ambulation/Gait assistance: Min guard;+2 safety/equipment Gait Distance (Feet): 30 Feet Assistive device: Rolling walker (2 wheeled) Gait Pattern/deviations: Step-through pattern;Decreased stride length;Trunk flexed;Decreased step length - left;Decreased stance time - right;Decreased weight shift to right;Wide base of support;Antalgic Gait velocity: decreased   General Gait Details: pain R LE with movement, min guard x2 for safety and equipment management, fatigued at end of gait distance   Stairs             Wheelchair Mobility    Modified Rankin (Stroke Patients Only)       Balance Overall balance assessment: Needs assistance Sitting-balance support: No upper extremity supported;Feet supported Sitting balance-Leahy Scale: Good Sitting balance - Comments: able to sit without UE assist at EOB   Standing balance support: Bilateral upper extremity supported;During functional activity Standing balance-Leahy Scale: Poor Standing balance comment: reliant on external support.  I almost fell with nursing"                            Cognition Arousal/Alertness: Awake/alert Behavior During Therapy: WFL for tasks assessed/performed Overall Cognitive Status: Within Functional Limits for tasks assessed  General Comments: frustrated about length of hospital stay and still being admitted      Exercises       General Comments General comments (skin integrity, edema, etc.): dizziness with gait, noted increased drainage R LE with walking      Pertinent Vitals/Pain Pain Assessment: Faces Faces Pain Scale: Hurts even more Pain Location: R LE with movement Pain Descriptors / Indicators: Aching;Sore;Discomfort;Grimacing;Guarding Pain Intervention(s): Limited activity within patient's tolerance;Monitored during session;Premedicated before session    Home Living                      Prior Function            PT Goals (current goals can now be found in the care plan section) Acute Rehab PT Goals Patient Stated Goal: keep getting better PT Goal Formulation: With patient Time For Goal Achievement: 08/28/19 Potential to Achieve Goals: Good Progress towards PT goals: Progressing toward goals    Frequency    Min 3X/week      PT Plan Discharge plan needs to be updated;Equipment recommendations need to be updated    Co-evaluation              AM-PAC PT "6 Clicks" Mobility   Outcome Measure  Help needed turning from your back to your side while in a flat bed without using bedrails?: None Help needed moving from lying on your back to sitting on the side of a flat bed without using bedrails?: A Little Help needed moving to and from a bed to a chair (including a wheelchair)?: A Little Help needed standing up from a chair using your arms (e.g., wheelchair or bedside chair)?: A Little Help needed to walk in hospital room?: A Little Help needed climbing 3-5 steps with a railing? : A Lot 6 Click Score: 18    End of Session Equipment Utilized During Treatment: Gait belt Activity Tolerance: Patient tolerated treatment well;Patient limited by fatigue Patient left: in bed;with call bell/phone within reach   PT Visit Diagnosis: Other abnormalities of gait and mobility (R26.89);Muscle weakness (generalized) (M62.81);Pain;Difficulty in walking, not elsewhere classified (R26.2) Pain  - Right/Left: Right Pain - part of body: Leg;Hip     Time: 9038-3338 PT Time Calculation (min) (ACUTE ONLY): 30 min  Charges:  $Gait Training: 8-22 mins $Self Care/Home Management: 8-22                     Deniece Ree PT, DPT, CBIS  Supplemental Physical Therapist [place]    Pager [phone] Acute Rehab Office [phone]

## 2019-08-14 NOTE — Progress Notes (Signed)
Inpatient Rehabilitation Admissions Coordinator  I met with patient at bedside to reassess patient for a possible inpt rehab admit. We have been involved since 9/9 with initial consult. Patient has previously wanted to d/c home with his Dad and felt he was capable with his mobility. He now states he is scared and feels no matter the cost, that he would like to admit to CIR for a week to get extensive therapy before d/c home. I will discuss with RN CM, Heather and contact financial counselor to speak with patient about any discounts he may be eligible for. I will meet with patient and his Mom tomorrow at bedside at 10 am after speaking with Rehab MD and clarify if candidate and bed availability.  Danne Baxter, RN, MSN Rehab Admissions Coordinator [phone] 08/14/2019 12:18 PM

## 2019-08-14 NOTE — Progress Notes (Signed)
Pt right leg dressing changed with compression.

## 2019-08-14 NOTE — TOC Progression Note (Signed)
Transition of Care [facility]) - Progression Note    Patient Details  Name: Bradley Cox MRN: [id number] Date of Birth: 12-Dec-1970  Transition of [facility] CM/SW Contact  Jacalyn Lefevre Edson Snowball, RN Phone Number: 08/14/2019, 10:09 AM  Clinical Narrative:    PT now recommending SNF. Discussed with patient. Patient aware SNF would be private pay. NCM contacted a SNF admissions coordinator for estimated cost of SNF. Provided information to patient. Patient aware this was an estimate and he would have to pay a percentage up front.   Patient wants time to think about information and to discuss with his mother. Patient requested NCM to come back at 4 pm today when his mother will be present. NCM asked if we could call his mother now to discuss. Patient declined.     Expected Discharge Plan: [place] Barriers to Discharge: Continued Medical Work up  Expected Discharge Plan and Services Expected Discharge Plan: [place] In-house Referral: Clinical Social Work Discharge Planning Services: CM Consult Post Acute Care Choice: Home Health, Durable Medical Equipment Living arrangements for the past 2 months: [place]                 DME Arranged: 3-N-1, Walker rolling DME Agency: [employer] Date DME Agency Contacted: 08/06/19 Time DME Agency Contacted: 37 Representative spoke with at DME Agency: [place]: RN, PT, Social Work [employer] Agency: [place] ([place]) Date [place]: 08/06/19 Time [place]: 39 Representative spoke with at [place]: [place] ([place]) Interventions    Readmission Risk Interventions No flowsheet data found.

## 2019-08-14 NOTE — Progress Notes (Signed)
Ortho Trauma Note  Continues to drain serosang fluid. Hip spica wrap in place but patient unable to tolerate. Pain still an issue.  RLE swelling much improved to RLE but still with serosang drainage  A/P continue with compressive wrap and dressing change PRN  Unfortunately he continues to drain. CT scan of hip performed shows no abscess. Likely secondary to size of initial abscess, size of patient and anticoagulation. Will continue to follow  Shona Needles, MD [facility] [phone] (phone) [phone] (office) [email or web address]

## 2019-08-14 NOTE — Progress Notes (Signed)
PROGRESS NOTE    Bradley Cox  [id number] DOB: 15-Mar-1971 DOA: 07/05/2019 PCP: Simona Huh, NP   Brief Narrative:   48 year old gentleman with history of diabetes, essential hypertension, asthma presents with fever was found to have sepsis from MSSA infection with L3-S1 paraspinal abscess, right iliopsoas and right posterior thigh abscesses.  He underwent serial debridement by orthopedics.  Infectious disease consulted and recommended to continue with Ancef till 08/27/2019.  Hospital course complicated by right lower extremity DVT and he was started on Xarelto.  He also underwent repeat I&D on 10/7. Post op  hemo vac was removed on 08/10/2019.But he had a lot of sero sanguineous discharge from the wound and the bandages were soaked.   Orthopedics requested to re evaluate the patient. He was placed on compression wraps/ bandages on the right lower extremity .  Pt seen and examined today. He continues to have significant amount of sero sanguinous discharge from the wounds and orthopedics is following the patient.  Meanwhile PT eval recommending CIR. CIR consult placed and awaiting their final recommendations.   Assessment & Plan:   Principal Problem:   MSSA bacteremia Active Problems:   Obesity, Class III, BMI 40-49.9 (morbid obesity) ([place])   Essential hypertension   Sepsis ([place])   AKI (acute kidney injury) ([place])   Hyperkalemia   Abscess of right hip   Paraspinal abscess ([facility])   Compression of right sciatic nerve   Acute DVT (deep venous thrombosis) ([facility])    Sepsis secondary to disseminated MSSA infection leading to bacteremia, L3-S1 paraspinal abscess, right psoas abscess, right posterior thigh abscess: Sepsis resolved.  Patient is afebrile and he had undergone serial debridements of the right thigh abscess by orthopedics.   He also underwent CT-guided drainage of the paraspinal abscess by IR. Infectious disease consulted and recommended to continue with IV Ancef till  08/27/2019.  As patient developed significant output from the incisional wound [facility] orthopedics did a repeat incision and irrigation and drainage and wound closure on 08/08/2019.  Hemovac was removed by orthopedics on 08/10/2019 but patient reports worsening  of serosanguineous discharge from the right lower extremity wound.    Appreciate ortho follow up.  He currently has compression bandages on his right LL. Unfortunately the wound continues to drain due to the large size of the abscess and residual dead space. Continue with dressing changes and compressive wraps for now.       Anemia of chronic disease/anemia acute blood loss from repeated irrigation and drainage Transfuse to keep hemoglobin greater than 7.  Patient underwent 2 units of PRBC transfusion so far. Repeat hemoglobin at 7.6 and stable.     DVT of the right lower extremity secondary to limited ambulation.   Patient required 2 units of PRBC transfusion already and he was started on Xarelto on 08/04/2019. Continue with xarelto.      DKA Resolved.   Essential hypertension  Well controlled.   Asthma No wheezing , good sats on RA.    Obesity Outpatient follow-up with PCP for diet and exercise.  Hypokalemia Replaced.     Diabetes mellitus  CBG (last 3)  Recent Labs    08/13/19 2134 08/14/19 0753 08/14/19 1204  GLUCAP 131* 145* 137*   Resume NOVOLIN N 28 units BID and novolog 2 units TIDAC  And SSI.    Constipation:  Resolved.       DVT prophylaxis: Xarelto  code Status: Full code Family Communication: none at bed side today.  Disposition Plan: Possible discharge  after orthopedics clears him.   Consultants:   Orthopedics  Procedures: Irrigation and debridement of right thigh abscess with closure on 08/08/2019 by Dr. Doreatha Martin Antimicrobials:cefazolin  Subjective: Pt is frustrated that his wound continues to drain. But he has no other complaints.    Objective: Vitals:   08/12/19 2130  08/13/19 0540 08/13/19 2133 08/14/19 0438  BP: 104/66 115/75 110/70 111/66  Pulse: 92 86 95 91  Resp: 18 18 19 20   Temp: 98.7 F (37.1 C) 98.8 F (37.1 C) 98.1 F (36.7 C) 97.9 F (36.6 C)  TempSrc: Oral Oral Oral Oral  SpO2: 96% 96% 97% 97%  Weight:      Height:        Intake/Output Summary (Last 24 hours) at 08/14/2019 1429 Last data filed at 08/14/2019 0951 Gross per 24 hour  Intake 1110 ml  Output 3650 ml  Net -2540 ml   Filed Weights   07/05/19 1807 07/06/19 2116 07/27/19 1113  Weight: (!) 167.4 kg (!) 164 kg (!) 164 kg    Examination:  General exam: alert and not in any distress.  Respiratory system: good air entry bilateral, no wheezing or rhonchi.  Cardiovascular system:  S1S2, RRR, no JVD. Gastrointestinal system:  abd is soft, NT ND BS+ Central nervous system:  Alert and oriented, non focal  Extremities: right lower extremity with compression wrap soaked with serosanguinous fluid.  Skin: no rashes evident.  Psychiatry: pt appears frustrated.     Data Reviewed: I have personally reviewed following labs and imaging studies  CBC: Recent Labs  Lab 08/08/19 0407 08/09/19 1850 08/10/19 0440 08/11/19 1654 08/13/19 2027 08/14/19 0327  WBC 5.2  --  10.0 6.9 5.5 5.2  HGB 7.3* 7.8* 7.5* 7.7* 7.6* 7.6*  HCT 25.1* 24.9* 25.8* 25.2* 26.1* 25.2*  MCV 99.6  --  100.4* 97.3 98.1 98.1  PLT 258  --  252 237 240 [id number]   Basic Metabolic Panel: Recent Labs  Lab 08/09/19 1850 08/10/19 0440 08/13/19 2027 08/14/19 0327  NA 133* 135 136 137  K 3.6 3.8 3.8 3.9  CL 96* 98 99 97*  CO2 29 27 29 28   GLUCOSE 162* 159* 152* 115*  BUN 12 9 7 8   CREATININE 0.56* 0.53* 0.67 0.53*  CALCIUM 8.4* 8.6* 8.5* 8.8*   GFR: Estimated Creatinine Clearance: 188.1 mL/min (A) (by C-G formula based on SCr of 0.53 mg/dL (L)). Liver Function Tests: No results for input(s): AST, ALT, ALKPHOS, BILITOT, PROT, ALBUMIN in the last 168 hours. No results for input(s): LIPASE, AMYLASE in the  last 168 hours. No results for input(s): AMMONIA in the last 168 hours. Coagulation Profile: No results for input(s): INR, PROTIME in the last 168 hours. Cardiac Enzymes: No results for input(s): CKTOTAL, CKMB, CKMBINDEX, TROPONINI in the last 168 hours. BNP (last 3 results) No results for input(s): PROBNP in the last 8760 hours. HbA1C: No results for input(s): HGBA1C in the last 72 hours. CBG: Recent Labs  Lab 08/13/19 1234 08/13/19 1718 08/13/19 2134 08/14/19 0753 08/14/19 1204  GLUCAP 126* 104* 131* 145* 137*   Lipid Profile: No results for input(s): CHOL, HDL, LDLCALC, TRIG, CHOLHDL, LDLDIRECT in the last 72 hours. Thyroid Function Tests: No results for input(s): TSH, T4TOTAL, FREET4, T3FREE, THYROIDAB in the last 72 hours. Anemia Panel: No results for input(s): VITAMINB12, FOLATE, FERRITIN, TIBC, IRON, RETICCTPCT in the last 72 hours. Sepsis Labs: No results for input(s): PROCALCITON, LATICACIDVEN in the last 168 hours.  Recent Results (from the past 240 hour(s))  Surgical pcr screen     Status: None   Collection Time: 08/08/19  9:22 AM   Specimen: Nasal Mucosa; Nasal Swab  Result Value Ref Range Status   MRSA, PCR NEGATIVE NEGATIVE Final   Staphylococcus aureus NEGATIVE NEGATIVE Final    Comment: (NOTE) The Xpert SA Assay (FDA approved for NASAL specimens in patients 21 years of age and older), is one component of a comprehensive surveillance program. It is not intended to diagnose infection nor to guide or monitor treatment. Performed at [facility], [place] [address], [place], [place] [address]          Radiology Studies: No results found.      Scheduled Meds: . acetaminophen  1,000 mg Oral TID  . Chlorhexidine Gluconate Cloth  6 each Topical Daily  . gabapentin  300 mg Oral TID  . hydrALAZINE  25 mg Oral BID  . influenza vac split quadrivalent PF  0.5 mL Intramuscular Tomorrow-1000  . insulin aspart  0-15 Units Subcutaneous TID WC  .  insulin aspart  0-5 Units Subcutaneous QHS  . insulin aspart  8 Units Subcutaneous TID WC  . insulin NPH Human  28 Units Subcutaneous BID AC & HS  . iron polysaccharides  150 mg Oral Daily  . methocarbamol  750 mg Oral TID  . metoCLOPramide  10 mg Oral TID AC & HS  . multivitamin with minerals  1 tablet Oral Daily  . polyethylene glycol  17 g Oral BID  . Ensure Max Protein  11 oz Oral TID BM  . rivaroxaban  15 mg Oral BID   Followed by  . [START ON 08/25/2019] rivaroxaban  20 mg Oral Q supper  . senna-docusate  1 tablet Oral BID  . sodium chloride flush  10-40 mL Intracatheter Q12H   Continuous Infusions: .  ceFAZolin (ANCEF) IV 2 g (08/14/19 0840)     LOS: 86 days       Hosie Poisson, MD [facility] Pager [phone]  If 7PM-7AM, please contact night-coverage [email or web address] Password [email or web address] 08/14/2019, 2:29 PM

## 2019-08-15 LAB — GLUCOSE, CAPILLARY
Glucose-Capillary: 182 mg/dL — ABNORMAL HIGH (ref 70–99)
Glucose-Capillary: 129 mg/dL — ABNORMAL HIGH (ref 70–99)
Glucose-Capillary: 105 mg/dL — ABNORMAL HIGH (ref 70–99)
Glucose-Capillary: 161 mg/dL — ABNORMAL HIGH (ref 70–99)

## 2019-08-15 MED ORDER — LIVING WELL WITH DIABETES BOOK
Freq: Once | Status: AC
  Administered 2019-08-15: 18:00:00
  Filled 2019-08-15: qty 1

## 2019-08-15 MED ORDER — OXYCODONE HCL ER 20 MG PO T12A
40.0000 mg | EXTENDED_RELEASE_TABLET | Freq: Two times a day (BID) | ORAL | Status: DC
  Administered 2019-08-15 – 2019-08-16 (×3): 40 mg via ORAL
  Filled 2019-08-15 (×3): qty 2

## 2019-08-15 NOTE — H&P (Signed)
Physical Medicine and Rehabilitation Admission H&P    Chief Complaint  Patient presents with   Back Pain  : HPI: Bradley Cox is a 48 year old right-handed male with history of hypertension, diabetes mellitus, morbid obesity, OSA.  Per chart review lives with son .  1 level home 3 steps to entry.  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 Plans to discharge home with family.  Presented 07/06/2019 with complaints of right flank pain x3 weeks and recently seen in urgent care.  Patient did recently receive a shot of Toradol to the right buttocks as well as steroid Dosepak.  Patient reports low back pain radiating to the right gluteus/thigh area with progressive decline in right lower extremity strength and difficulty in ambulation.  Patient noted to have low-grade fever as well as tachycardia, tachypneic.  Blood glucose was elevated with increased anion gap.  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  Placed on broad-spectrum antibiotics.  Neurosurgery consulted did not feel needed surgical intervention and exam was consistent with peripheral nerve and lumbar plexus involvement.  Underwent IR guided paraspinal abscess drainage per Dr. Doreatha Martin and blood culture completed showing MSSA.  Infectious disease consulted advised antibiotic therapy x6 to 8 weeks with Ancef through 08/27/2019.  Echocardiogram and TEE completed showing ejection fraction 29% normal systolic function no vegetation noted.  Patient underwent I&D of large gluteal abscess and neuroplasty of sciatic nerve 07/09/2019 per Dr. Doreatha Martin followed by multiple right lower extremity I&D's and wound VAC placement with changes and later with I&D with closure removal of wound VAC 08/08/2019 and Hemovac drain placed that was later removed 08/10/2019 with persistent serosanguineous drainage from wound compressive wrap and dressing  placed.  Follow-up CT scan of the hip performed showing no abscess felt drainage most likely related to size of initial abscess .  Hospital course complicated by right posterior tibial vein DVT 08/01/2019.  CT angiogram of chest negative for pulmonary emboli and patient was started on Xarelto.  Hospital course anemia patient has been transfused 2 units of packed red blood cells during hospital course latest hemoglobin 7.8.  Therapy evaluations completed and patient was admitted for a comprehensive rehab program.  Review of Systems  Constitutional: Negative for chills and fever.  HENT: Negative for hearing loss.   Eyes: Negative for blurred vision and double vision.  Respiratory: Negative for cough and shortness of breath.   Cardiovascular: Positive for leg swelling. Negative for chest pain and palpitations.  Gastrointestinal: Positive for constipation. Negative for heartburn, nausea and vomiting.  Genitourinary: Negative for dysuria, flank pain and hematuria.  Musculoskeletal: Positive for joint pain and myalgias.  Skin: Negative for rash.  Psychiatric/Behavioral: The patient has insomnia.   All other systems reviewed and are negative.  Past Medical History:  Diagnosis Date   Arthritis    Asthma    Diabetes mellitus, type 2 ([place])    Hypertension    Morbid obesity ([place])    OSA (obstructive sleep apnea)    Past Surgical History:  Procedure Laterality Date   APPLICATION OF WOUND VAC Right 07/17/2019   Procedure: Application Of Wound Vac;  Surgeon: Altamese Marksville, MD;  Location: [place];  Service: Orthopedics;  Laterality: Right;   APPLICATION OF WOUND VAC Right 07/20/2019   Procedure: Application Of Wound Vac;  Surgeon: Altamese , MD;  Location: [place];  Service: Orthopedics;  Laterality: Right;  APPLICATION OF WOUND VAC Right 07/27/2019   Procedure: Application Of Wound Vac;  Surgeon: Shona Needles, MD;  Location: [place];  Service: Orthopedics;  Laterality: Right;   HERNIA  REPAIR     As a baby   I&D EXTREMITY Right 07/27/2019   Procedure: IRRIGATION AND DEBRIDEMENT EXTREMITY Right Leg;  Surgeon: Shona Needles, MD;  Location: [place];  Service: Orthopedics;  Laterality: Right;   I&D EXTREMITY Right 07/30/2019   Procedure: IRRIGATION AND DEBRIDEMENT EXTREMITY WITH WOUND VAC CHANGE;  Surgeon: Shona Needles, MD;  Location: [place];  Service: Orthopedics;  Laterality: Right;  IRRIGATION AND DEBRIDEMENT EXTREMITY WITH WOUND VAC CHANGE   I&D EXTREMITY Right 08/08/2019   Procedure: IRRIGATION AND DEBRIDEMENT RIGHT EXTREMITY;  Surgeon: Shona Needles, MD;  Location: [place];  Service: Orthopedics;  Laterality: Right;   INCISION AND DRAINAGE ABSCESS Right 07/09/2019   Procedure: INCISION AND DRAINAGE ABSCESS RIGHT HIP/PELVIS;  Surgeon: Shona Needles, MD;  Location: [place];  Service: Orthopedics;  Laterality: Right;   INCISION AND DRAINAGE HIP Right 07/17/2019   Procedure: IRRIGATION AND DEBRIDEMENT HIP;  Surgeon: Altamese [place]ace, MD;  Location: [place];  Service: Orthopedics;  Laterality: Right;   INCISION AND DRAINAGE HIP Right 07/20/2019   Procedure: Irrigation And Debridement Hip;  Surgeon: Altamese [place], MD;  Location: [place];  Service: Orthopedics;  Laterality: Right;   TEE WITHOUT CARDIOVERSION N/A 07/11/2019   Procedure: TRANSESOPHAGEAL ECHOCARDIOGRAM (TEE);  Surgeon: Sanda Klein, MD;  [facility];  Service: Cardiovascular;  Laterality: N/A;   Family History  Problem Relation Age of Onset   Diabetes Paternal Grandmother    Social History:  reports that he has never smoked. His smokeless tobacco use includes snuff. He reports current alcohol use. He reports that he does not use drugs. Allergies:  Allergies  Allergen Reactions   Tramadol Anaphylaxis   Medications Prior to Admission  Medication Sig Dispense Refill   albuterol (VENTOLIN HFA) 108 (90 Base) MCG/ACT inhaler Inhale 1 puff into the lungs every 6 (six) hours as needed for wheezing or  shortness of breath.      hydrALAZINE (APRESOLINE) 50 MG tablet Take 50 mg by mouth 2 (two) times daily.     ibuprofen (ADVIL) 200 MG tablet Take 600 mg by mouth every 6 (six) hours as needed for headache or moderate pain.     metFORMIN (GLUCOPHAGE) 500 MG tablet Take 1 tablet (500 mg total) by mouth 2 (two) times daily with a meal. 60 tablet 0   Multiple Vitamin (THERA) TABS Take 2 tablets by mouth daily.     blood glucose meter kit and supplies KIT Dispense based on patient and insurance preference. Use up to four times daily as directed. (FOR ICD-9 250.00, 250.01). 1 each 0    Drug Regimen Review  Drug regimen was reviewed and remains appropriate with no significant issues identified  Home: Home Living Family/patient expects to be discharged to:: Private residence Living Arrangements: Parent, Children Available Help at Discharge: Family, Available PRN/intermittently Type of Home: House Home Access: Stairs to enter [employer] of Steps: 3-4 Entrance Stairs-Rails: Right Home Layout: One level Bathroom Shower/Tub: Multimedia programmer: Standard Home Equipment: None   Functional History: Prior Function Level of Independence: Independent Comments: Truck driver.  Functional Status:  Mobility: Bed Mobility Overal bed mobility: Needs Assistance Bed Mobility: Supine to Sit, Sit to Supine Rolling: Min assist, +2 for physical assistance Sidelying to sit: Min assist, +2 for physical assistance, HOB  elevated Supine to sit: Min guard, HOB elevated Sit to supine: Min assist, HOB elevated(R LE management) Sit to sidelying: Mod assist, +2 for physical assistance General bed mobility comments: minimal assist for R LE management back to bed Transfers Overall transfer level: Needs assistance Equipment used: Rolling walker (2 wheeled) Transfers: Sit to/from Stand Sit to Stand: From elevated surface, Min guard Stand pivot transfers: Mod assist, +2 physical  assistance, +2 safety/equipment General transfer comment: min guard x2 for safety, rocking and extended time Ambulation/Gait Ambulation/Gait assistance: Min guard, +2 safety/equipment Gait Distance (Feet): 30 Feet Assistive device: Rolling walker (2 wheeled) Gait Pattern/deviations: Step-through pattern, Decreased stride length, Trunk flexed, Decreased step length - left, Decreased stance time - right, Decreased weight shift to right, Wide base of support, Antalgic General Gait Details: pain R LE with movement, min guard x2 for safety and equipment management, fatigued at end of gait distance Gait velocity: decreased    ADL: ADL Overall ADL's : Needs assistance/impaired Eating/Feeding: Set up, Bed level Grooming: Set up Upper Body Bathing: Set up, Bed level Lower Body Bathing: Moderate assistance, Bed level Upper Body Dressing : Set up, Sitting Lower Body Dressing: Moderate assistance, Bed level Lower Body Dressing Details (indicate cue type and reason): total A to adjust socks in standing Toilet Transfer: Minimal assistance, RW, Ambulation Toilet Transfer Details (indicate cue type and reason): unable to transfer d/t syncope episode at [place] and Hygiene: Maximal assistance Functional mobility during ADLs: Minimal assistance, Rolling walker, Cueing for safety General ADL Comments: Pt continues to present with decreased functional performance and is limited by pain; pt with syncope episode at EOB reporting he feels "weird" pt briefly unresponsive for ~ 10 seconds; therapists assisted back to bed with BP taken ( 123/76) Pt continued to be frustrated with limited progress  Cognition: Cognition Overall Cognitive Status: Within Functional Limits for tasks assessed Orientation Level: Oriented X4 Cognition Arousal/Alertness: Awake/alert Behavior During Therapy: WFL for tasks assessed/performed Overall Cognitive Status: Within Functional Limits for tasks  assessed General Comments: frustrated about length of hospital stay and still being admitted  Physical Exam: Blood pressure 129/72, pulse 97, temperature 98.5 F (36.9 C), temperature source Oral, resp. rate 19, height '6\' 6"'  (1.981 m), weight (!) 164 kg, SpO2 97 %. Physical Exam  Constitutional: He is oriented to person, place, and time. He appears well-developed and well-nourished. No distress.  obese  HENT:  Head: Normocephalic and atraumatic.  Eyes: Pupils are equal, round, and reactive to light. EOM are normal.  Neck: Normal range of motion. No tracheal deviation present. No thyromegaly present.  Cardiovascular: Normal rate and regular rhythm. Exam reveals no friction rub.  Respiratory: Effort normal. No respiratory distress. He has no wheezes.  GI: Soft. He exhibits no distension. There is no abdominal tenderness.  Musculoskeletal:        General: Edema (RLE) present.  Neurological: He is alert and oriented to person, place, and time.  Patient is alert in no acute distress.  Follows full commands fair awareness of deficits. UE 5/5. RLE 2-/5 HF, KE and 4/5 ADF/PF. LLE: 4/5 prox to 5/5 distally. No focal sensory findings.   Skin:  Dressing in place to right lower extremity there is a small amount of serosanguineous drainage without odor. Right hip with large padded dressing.   Psychiatric: He has a normal mood and affect. His behavior is normal. Thought content normal.    Results for orders placed or performed during the hospital encounter of 07/05/19 (from the  past 48 hour(s))  Glucose, capillary     Status: Abnormal   Collection Time: 08/13/19  8:07 AM  Result Value Ref Range   Glucose-Capillary 138 (H) 70 - 99 mg/dL  Glucose, capillary     Status: Abnormal   Collection Time: 08/13/19 12:34 PM  Result Value Ref Range   Glucose-Capillary 126 (H) 70 - 99 mg/dL  Glucose, capillary     Status: Abnormal   Collection Time: 08/13/19  5:18 PM  Result Value Ref Range    Glucose-Capillary 104 (H) 70 - 99 mg/dL  CBC     Status: Abnormal   Collection Time: 08/13/19  8:27 PM  Result Value Ref Range   WBC 5.5 4.0 - 10.5 K/uL   RBC 2.66 (L) 4.22 - 5.81 MIL/uL   Hemoglobin 7.6 (L) 13.0 - 17.0 g/dL   HCT 26.1 (L) 39.0 - 52.0 %   MCV 98.1 80.0 - 100.0 fL   MCH 28.6 26.0 - 34.0 pg   MCHC 29.1 (L) 30.0 - 36.0 g/dL   RDW 15.0 11.5 - 15.5 %   Platelets 240 150 - 400 K/uL   nRBC 0.0 0.0 - 0.2 %    Comment: Performed at [facility], [place] [address] [place], [place] [address]  Basic metabolic panel     Status: Abnormal   Collection Time: 08/13/19  8:27 PM  Result Value Ref Range   Sodium 136 135 - 145 mmol/L   Potassium 3.8 3.5 - 5.1 mmol/L   Chloride 99 98 - 111 mmol/L   CO2 29 22 - 32 mmol/L   Glucose, Bld 152 (H) 70 - 99 mg/dL   BUN 7 6 - 20 mg/dL   Creatinine, Ser 0.67 0.61 - 1.24 mg/dL   Calcium 8.5 (L) 8.9 - 10.3 mg/dL   GFR calc non Af Amer >60 >60 mL/min   GFR calc Af Amer >60 >60 mL/min   Anion gap 8 5 - 15    Comment: Performed at [facility] [address] [place], [place] [address]  Glucose, capillary     Status: Abnormal   Collection Time: 08/13/19  9:34 PM  Result Value Ref Range   Glucose-Capillary 131 (H) 70 - 99 mg/dL  CBC     Status: Abnormal   Collection Time: 08/14/19  3:27 AM  Result Value Ref Range   WBC 5.2 4.0 - 10.5 K/uL   RBC 2.57 (L) 4.22 - 5.81 MIL/uL   Hemoglobin 7.6 (L) 13.0 - 17.0 g/dL   HCT 25.2 (L) 39.0 - 52.0 %   MCV 98.1 80.0 - 100.0 fL   MCH 29.6 26.0 - 34.0 pg   MCHC 30.2 30.0 - 36.0 g/dL   RDW 15.3 11.5 - 15.5 %   Platelets 219 150 - 400 K/uL   nRBC 0.4 (H) 0.0 - 0.2 %    Comment: Performed at [facility], [place] [address] [place], [place] [address]  Basic metabolic panel     Status: Abnormal   Collection Time: 08/14/19  3:27 AM  Result Value Ref Range   Sodium 137 135 - 145 mmol/L   Potassium 3.9 3.5 - 5.1 mmol/L   Chloride 97 (L) 98 - 111 mmol/L   CO2 28 22 - 32 mmol/L   Glucose,  Bld 115 (H) 70 - 99 mg/dL   BUN 8 6 - 20 mg/dL   Creatinine, Ser 0.53 (L) 0.61 - 1.24 mg/dL   Calcium 8.8 (L) 8.9 - 10.3 mg/dL  GFR calc non Af Amer >60 >60 mL/min   GFR calc Af Amer >60 >60 mL/min   Anion gap 12 5 - 15    Comment: Performed at [facility] [address], [place], [place] [address]  Glucose, capillary     Status: Abnormal   Collection Time: 08/14/19  7:53 AM  Result Value Ref Range   Glucose-Capillary 145 (H) 70 - 99 mg/dL  Glucose, capillary     Status: Abnormal   Collection Time: 08/14/19 12:04 PM  Result Value Ref Range   Glucose-Capillary 137 (H) 70 - 99 mg/dL  Glucose, capillary     Status: Abnormal   Collection Time: 08/14/19  5:11 PM  Result Value Ref Range   Glucose-Capillary 112 (H) 70 - 99 mg/dL  Glucose, capillary     Status: Abnormal   Collection Time: 08/14/19  9:04 PM  Result Value Ref Range   Glucose-Capillary 135 (H) 70 - 99 mg/dL   Comment 1 Notify RN    Comment 2 Document in Chart    No results found.     Medical Problem List and Plan: 1.  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  Status post multiple I&D's as well as wound VAC changes.  Hemovac drain removed 08/10/2019  -admit to inpatient rehab 2.  Antithrombotics: -DVT/anticoagulation: Xarelto for right posterior tibial vein DVT identified on Dopplers 08/01/2019.  -antiplatelet therapy: N/A 3. Pain Management: OxyContin 40 mg every 12 hours, Neurontin 300 mg 3 times daily, Robaxin 750 mg 3 times daily, oxycodone as needed 4. Mood: Provide emotional support  -antipsychotic agents: N/A 5. Neuropsych: This patient is capable of making decisions on his own behalf. 6. Skin/Wound Care: Compressive wrap and dressing changes as needed to thigh/hip wounds.  7. Fluids/Electrolytes/Nutrition: Routine in and outs with follow-up chemistries 8.  ID/MSSA  bacteremia/disseminated infection.  Continue IV Ancef through 08/27/2019 9.  Acute blood loss anemia.  Follow-up CBC.  Continue iron supplement 10.  Diabetes mellitus.  Latest hemoglobin A1c 12.6.  NovoLog 8 units 3 times daily with meals, insulin NPH 28 units twice daily.    -check CBG's tid ac/hs  -adjust regimen as needed.  11.  Hypertension.  Hydralazine 25 mg twice daily.  Monitor with increased mobility 12.  Morbid obesity.  BMI 40-49.9.  Dietary follow-up 13.  Constipation.  Laxative assistance     Cathlyn Parsons, PA-C 08/15/2019

## 2019-08-15 NOTE — Progress Notes (Signed)
Inpatient Rehabilitation Admissions Coordinator  I will meet with patient and his Mom at 10 am to continue to discuss venue options. Pt continues to receive IV Dilaudid about every 4 hrs during the day which is a barrier to admit to CIR. I discussed this with him twice yesterday and will discuss further at our 10 am meeting.  Danne Baxter, RN, MSN Rehab Admissions Coordinator [phone] 08/15/2019 8:31 AM

## 2019-08-15 NOTE — Plan of Care (Signed)

## 2019-08-15 NOTE — Progress Notes (Signed)
Inpatient Rehabilitation Admissions Coordinator  I met with patient and his Mom at bedside. I discussed that I asked Financial counselor to contact patient to discuss any discounts or options he had for his medical bills, I discussed with Dr. Naaman Plummer and pt felt patient to benefit from a 5 to 7 day CIR stay at his current Min guard assist level with supervision goals, and that he would have to transition off IV Dilaudid to po pain management before I would offer a CIR bed. Patient currently taking IV Dilaudid about every 4 hrs and Oxy. Since his pain is not manageable with po pian meds, he is not at a level to be able to tolerate the intensity of an inpt rehab admission today. Patient states he would like to talk to Dr. Marylyn Ishihara about his pain regimen and I notified Dr. Marylyn Ishihara of this request. Bed availability is a day by day issue for CIR and patient is aware that I can not guarantee a bed over the next few days. He is aware that I was prepared to offer a CIR bed today, but due to his pain issues requiring IV Dilaudid , he was not ready to d/c to CIR.  I will follow up tomorrow.  Danne Baxter, RN, MSN Rehab Admissions Coordinator [phone] 08/15/2019 10:46 AM

## 2019-08-15 NOTE — Progress Notes (Signed)
Occupational Therapy Treatment Patient Details Name: Bradley Cox MRN: [id number] DOB: April 14, 1971 Today's Date: 08/15/2019    History of present illness Patient is a 48 y/o male who presents with right flank pain. Admitted with disseminated MSSA infection including bacteremia, Lumbosacral spinal epidural abscess, paraspinal/large gluteal/pelvic abscess with sciatica impingement. s/p I&D large gluteal abscess and neuroplasty of sciatica nerve 9/7. He has received multiple R LE I&Ds and wound vac placements/changes on 9/15, 9/18, 9/25, 9/28, and 10/7. Wound vac removed/hemovac placed 10/7.  s/p CT aspiration right paraspinal abscess. PMH includes DM, HTN, morbid obesity, OSA.   OT comments  Pt educated in use of sock aide and reacher for LB dressing. Stood at sink x 5 minutes to brush teeth. Ambulated to bathroom with min guard assist and RW. One LOB standing at EOB, but did not require assist to correct.    Follow Up Recommendations  Home health OT;Supervision/Assistance - 24 hour    Equipment Recommendations  3 in 1 bedside commode    Recommendations for Other Services      Precautions / Restrictions Precautions Precautions: Fall Precaution Comments: "falls" backward Restrictions RLE Weight Bearing: Weight bearing as tolerated       Mobility Bed Mobility Overal bed mobility: Needs Assistance Bed Mobility: Supine to Sit;Sit to Supine     Supine to sit: Modified independent (Device/Increase time) Sit to supine: Min assist   General bed mobility comments: min assist for R LE back into bed  Transfers Overall transfer level: Needs assistance Equipment used: Rolling walker (2 wheeled) Transfers: Sit to/from Stand Sit to Stand: From elevated surface;Min guard         General transfer comment: uses momentum     Balance Overall balance assessment: Needs assistance   Sitting balance-Leahy Scale: Good       Standing balance-Leahy Scale: Poor Standing balance comment:  can release walker in static standing momentarily to take meds, props on one hand at sink during toothbrushing                           ADL either performed or assessed with clinical judgement   ADL Overall ADL's : Needs assistance/impaired     Grooming: Oral care;Standing;Min guard               Lower Body Dressing: Minimal assistance;Sitting/lateral leans;With adaptive equipment Lower Body Dressing Details (indicate cue type and reason): instructed in use of wide sock aide and reacher             Functional mobility during ADLs: Min guard;Rolling walker General ADL Comments: pt with one loss of balance while standing at EOB, did not require assistance to regain balance     Vision       Perception     Praxis      Cognition Arousal/Alertness: Awake/alert Behavior During Therapy: WFL for tasks assessed/performed Overall Cognitive Status: Within Functional Limits for tasks assessed                                 General Comments: pt frustrated with pain meds not working         Exercises     Shoulder Instructions       General Comments      Pertinent Vitals/ Pain       Pain Assessment: Faces Faces Pain Scale: Hurts whole lot Pain Location: R LE with movement  Pain Descriptors / Indicators: Grimacing;Guarding Pain Intervention(s): Monitored during session;Repositioned;Patient requesting pain meds-RN notified;Premedicated before session  Home Living                                          Prior Functioning/Environment              Frequency  Min 3X/week        Progress Toward Goals  OT Goals(current goals can now be found in the care plan section)  Progress towards OT goals: Progressing toward goals  Acute Rehab OT Goals Patient Stated Goal: keep getting better OT Goal Formulation: With patient Time For Goal Achievement: 08/27/19 Potential to Achieve Goals: Good  Plan Discharge plan remains  appropriate    Co-evaluation                 AM-PAC OT "6 Clicks" Daily Activity     Outcome Measure   Help from another person eating meals?: None Help from another person taking care of personal grooming?: A Little Help from another person toileting, which includes using toliet, bedpan, or urinal?: A Little Help from another person bathing (including washing, rinsing, drying)?: A Lot Help from another person to put on and taking off regular upper body clothing?: None Help from another person to put on and taking off regular lower body clothing?: A Little 6 Click Score: 19    End of Session Equipment Utilized During Treatment: Rolling walker  OT Visit Diagnosis: Unsteadiness on feet (R26.81);Other abnormalities of gait and mobility (R26.89);Muscle weakness (generalized) (M62.81);Other symptoms and signs involving cognitive function;Pain   Activity Tolerance Patient tolerated treatment well   Patient Left in bed;with call bell/phone within reach;with family/visitor present   Nurse Communication Patient requests pain meds        Time: [id number] OT Time Calculation (min): 42 min  Charges: OT General Charges $OT Visit: 1 Visit OT Treatments $Self Care/Home Management : 38-52 mins  Nestor Lewandowsky, OTR/L Acute Rehabilitation Services Pager: [phone] Office: [phone]  Malka So 08/15/2019, 12:09 PM

## 2019-08-15 NOTE — Progress Notes (Signed)
Ortho Trauma Note  Continues to drain serosang fluid. Hip spica wrap not tolerated by patient. Discontinued IV Dilaudid today. Switched to po OxyContin, seems to be working well, was able to get about 2 hours of sleep this afternoon.   RLE swelling much improved but still with serosang drainage  A/P continue with compressive wrap and dressing change PRN  Unfortunately he continues to drain. CT scan of hip performed shows no abscess. Drainage likely secondary to size of initial abscess, size of patient and anticoagulation. Will continue to follow  [facility] ?[phone]? (phone)

## 2019-08-15 NOTE — Progress Notes (Signed)
[name]  PROGRESS NOTE    Bradley Cox  [id number] DOB: [birth year] DOA: 07/05/2019 PCP: Simona Huh, NP   Brief Narrative:   48 year old gentleman with history of diabetes, essential hypertension, asthma presents with fever was found to have sepsis from MSSA infection with L3-S1 paraspinal abscess, right iliopsoas and right posterior thigh abscesses.  He underwent serial debridement by orthopedics.  Infectious disease consulted and recommended to continue with Ancef till 08/27/2019.  Hospital course complicated by right lower extremity DVT and he was started on Xarelto.  He also underwent repeat I&D on 10/7. Post op hemo vac was removed on 08/10/2019.But he had a lot of sero sanguineous discharge from the wound and the bandages were soaked.  Orthopedics requested to re evaluate the patient. He was placed on compression wraps/ bandages on the right lower extremity .  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     - undergone serial debridements of the right thigh abscess by orthopedics.       - He also underwent CT-guided drainage of the paraspinal abscess by IR.     - Infectious disease consulted and recommended to continue with IV Ancef till 08/27/2019.  As patient developed significant output from the incisional wound [facility] orth[facility]ics did a repeat incision and irrigation and drainage and wound closure on 08/08/2019.      - Hemovac was removed by orthopedics on 08/10/2019 but  patient reports worsening  of serosanguineous discharge from the right lower extremity wound.       - He currently has compression bandages on his right LL; improved this morning  Anemia of chronic disease/anemia acute blood loss from repeated irrigation and drainage     - Transfuse to keep hemoglobin greater than 7.  DVT of the right lower extremity secondary to limited ambulation.     - Patient required 2 units of PRBC transfusion already and he was started on Xarelto on 08/04/2019.   DKA DM     - DKA resolved, continue novolin, novolog SSI   Essential hypertension      - hydralazine  Asthma     - asymptomatic, sats ok on RA   Obesity     - Outpatient follow-up with PCP for diet and exercise.  Hypokalemia     - Replaced; follow   Constipation:     - on reglan; ok at this point  Unable to go to CIR right now d/t need for IV pain meds. D/w pt/family at bedside. Will transition to MS contin and d/c diludid.   DVT prophylaxis: xarelto Code Status: FULL Family Communication: With mom at bedside   Disposition Plan: To CIR when stable  Consultants:   Ortho  Antimicrobials:  . Cefazolin   ROS:  Reports leg pain. Denies CP, dyspnea, N, V . Remainder 10-pt ROS is negative for all not previously mentioned.  Subjective: "I would be fine if we could bring it down to a 4 or 5"  Objective: Vitals:   08/14/19 1700 08/14/19 2105 08/15/19 0439 08/15/19 1257  BP: 112/77 123/74 129/72 124/72  Pulse: 95 (!) 101 97 98  Resp: 20 18 19 20   Temp: 98.8 F (37.1 C) 98.6 F (37 C) 98.5 F (36.9 C) 98.3 F (36.8 C)  TempSrc: Oral Oral Oral Oral  SpO2: 96% 95% 97% 97%  Weight:      Height:        Intake/Output Summary (Last 24 hours) at 08/15/2019 1740 Last data filed at 08/15/2019 1211 Gross per 24 hour  Intake 200 ml  Output 4050 ml  Net -3850 ml   Filed Weights   07/05/19 1807 07/06/19 2116 07/27/19 1113  Weight: (!) 167.4 kg (!) 164 kg (!) 164 kg     Examination:  General: 48 y.o. male resting in bed in NAD Cardiovascular: RRR, +S1, S2, no m/g/r, equal pulses throughout Respiratory: CTABL, no w/r/r, normal WOB GI: BS+, NDNT, no masses noted, no organomegaly noted MSK: No e/c/c; RLE bandaging CDI Skin: No rashes, bruises, ulcerations noted Neuro: Alert to name, follows commands Psyc: Appropriate interaction and affect, calm/cooperative   Data Reviewed: I have personally reviewed following labs and imaging studies.  CBC: Recent Labs  Lab 08/09/19 1850 08/10/19 0440 08/11/19 1654 08/13/19 2027 08/14/19 0327  WBC  --  10.0 6.9 5.5 5.2  HGB 7.8* 7.5* 7.7* 7.6* 7.6*  HCT 24.9* 25.8* 25.2* 26.1* 25.2*  MCV  --  100.4* 97.3 98.1 98.1  PLT  --  252 237 240 [id number]   Basic Metabolic Panel: Recent Labs  Lab 08/09/19 1850 08/10/19 0440 08/13/19 2027 08/14/19 0327  NA 133* 135 136 137  K 3.6 3.8 3.8 3.9  CL 96* 98 99 97*  CO2 29 27 29 28   GLUCOSE 162* 159* 152* 115*  BUN 12 9 7 8   CREATININE 0.56* 0.53* 0.67 0.53*  CALCIUM 8.4* 8.6* 8.5* 8.8*   GFR: Estimated Creatinine Clearance: 188.1 mL/min (A) (by C-G formula based on SCr of 0.53 mg/dL (L)). Liver Function Tests: No results for input(s): AST, ALT, ALKPHOS, BILITOT, PROT, ALBUMIN in the last 168 hours. No results for input(s): LIPASE, AMYLASE in the last 168 hours. No results for input(s): AMMONIA in the last 168 hours. Coagulation Profile: No results for input(s): INR, PROTIME in the last 168 hours. Cardiac Enzymes: No results for input(s): CKTOTAL, CKMB, CKMBINDEX, TROPONINI in the last 168 hours. BNP (last 3 results) No results for input(s): PROBNP in the last 8760 hours. HbA1C: No results for input(s): HGBA1C in the last 72 hours. CBG: Recent Labs  Lab 08/14/19 1204 08/14/19 1711 08/14/19 2104 08/15/19 0818 08/15/19 1209  GLUCAP 137* 112* 135* 182* 129*   Lipid Profile: No results for input(s): CHOL, HDL, LDLCALC, TRIG, CHOLHDL, LDLDIRECT in the last 72  hours. Thyroid Function Tests: No results for input(s): TSH, T4TOTAL, FREET4, T3FREE, THYROIDAB in the last 72 hours. Anemia Panel: No results for input(s): VITAMINB12, FOLATE, FERRITIN, TIBC, IRON, RETICCTPCT in the last 72 hours. Sepsis Labs: No results for input(s): PROCALCITON, LATICACIDVEN in the last 168 hours.  Recent Results (from the past 240 hour(s))  Surgical pcr screen     Status: None   Collection Time: 08/08/19  9:22 AM   Specimen: Nasal Mucosa; Nasal Swab  Result Value Ref Range Status   MRSA, PCR NEGATIVE NEGATIVE Final   Staphylococcus aureus NEGATIVE NEGATIVE Final    Comment: (NOTE) The Xpert SA Assay (FDA approved for NASAL specimens in patients 18 years of age  and older), is one component of a comprehensive surveillance program. It is not intended to diagnose infection nor to guide or monitor treatment. Performed at [facility], [place] [address], [place], [place] [address]       Radiology Studies: No results found.   Scheduled Meds: . acetaminophen  1,000 mg Oral TID  . Chlorhexidine Gluconate Cloth  6 each Topical Daily  . gabapentin  300 mg Oral TID  . hydrALAZINE  25 mg Oral BID  . influenza vac split quadrivalent PF  0.5 mL Intramuscular Tomorrow-1000  . insulin aspart  0-15 Units Subcutaneous TID WC  . insulin aspart  0-5 Units Subcutaneous QHS  . insulin aspart  8 Units Subcutaneous TID WC  . insulin NPH Human  28 Units Subcutaneous BID AC & HS  . iron polysaccharides  150 mg Oral Daily  . living well with diabetes book   Does not apply Once  . methocarbamol  750 mg Oral TID  . metoCLOPramide  10 mg Oral TID AC & HS  . multivitamin with minerals  1 tablet Oral Daily  . oxyCODONE  40 mg Oral Q12H  . polyethylene glycol  17 g Oral BID  . Ensure Max Protein  11 oz Oral TID BM  . rivaroxaban  15 mg Oral BID   Followed by  . [START ON 08/25/2019] rivaroxaban  20 mg Oral Q supper  . senna-docusate  1 tablet Oral BID  . sodium chloride  flush  10-40 mL Intracatheter Q12H   Continuous Infusions: .  ceFAZolin (ANCEF) IV 2 g (08/15/19 0949)     LOS: 40 days    Time spent: 25 minutes spent in the coordination of care today.   Jonnie Finner, DO [facility] Pager [phone]  If 7PM-7AM, please contact night-coverage [email or web address] Password [email or web address] 08/15/2019, 5:40 PM

## 2019-08-15 NOTE — Plan of Care (Signed)
  Problem: Nutrition: Goal: Adequate nutrition will be maintained Outcome: Progressing   Problem: Coping: Goal: Level of anxiety will decrease Outcome: Progressing   Problem: Pain Managment: Goal: General experience of comfort will improve Outcome: Progressing   Problem: Safety: Goal: Ability to remain free from injury will improve Outcome: Progressing   

## 2019-08-15 NOTE — Progress Notes (Signed)
Dressing to R hip and RLE changed by Patrecia Pace, PA and Katha Hamming, MD. RN at bedside. Pt tolerated well. Will continue to monitor.

## 2019-08-15 NOTE — Progress Notes (Signed)
Physical Therapy Treatment Patient Details Name: Bradley Cox MRN: [id number] DOB: 08-03-1971 Today's Date: 08/15/2019    History of Present Illness Patient is a 48 y/o male who presents with right flank pain. Admitted with disseminated MSSA infection including bacteremia, Lumbosacral spinal epidural abscess, paraspinal/large gluteal/pelvic abscess with sciatica impingement. s/p I&D large gluteal abscess and neuroplasty of sciatica nerve 9/7. He has received multiple R LE I&Ds and wound vac placements/changes on 9/15, 9/18, 9/25, 9/28, and 10/7. Wound vac removed/hemovac placed 10/7.  s/p CT aspiration right paraspinal abscess. PMH includes DM, HTN, morbid obesity, OSA.    PT Comments    Pt cooperative and eager to participate in PT today, however session limited by onset of symptomatic orthostatic hypotension (see vitals below for detail). Pt and mother were educated on relevance of vital measures and rationale for holding further gait training at this time. Communicated with RN Cristy regarding BP values following session. Pt demos improvement in bed/transfer ability with growing independence, will continue to benefit from skilled PT to address functional mobility and independence.   VITALS:  - sitting EOB - BP: 121/101 - standing - BP: 104/78 (87); HR: 120bpm - standing after 3 min - BP: 110/87 (96); HR: 123bpm - sitting EOB - 128/94 (104); HR: 109bpm  - standing - 135/120 (127) - sitting EOB - 135/90 - supine - 134/93   Follow Up Recommendations  CIR;Supervision/Assistance - 24 hour     Equipment Recommendations  Rolling walker with 5" wheels;3in1 (PT);Wheelchair (measurements PT);Wheelchair cushion (measurements PT)    Recommendations for Other Services       Precautions / Restrictions Precautions Precautions: Fall Precaution Comments: very sensitive/wound present R posterior thigh, check BPs Restrictions Weight Bearing Restrictions: No RLE Weight Bearing: Weight  bearing as tolerated    Mobility  Bed Mobility Overal bed mobility: Independent Bed Mobility: Rolling;Supine to Sit;Sit to Supine Rolling: Modified independent (Device/Increase time)(pt uses bed rails and elevated HOB)   Supine to sit: Modified independent (Device/Increase time);Supervision Sit to supine: Min assist(minA for management of foley and minimal physical assist for RLE)   General bed mobility comments: Pt able to complete transfers without assist, benefits from supervision for line management  Transfers Overall transfer level: Needs assistance Equipment used: Rolling walker (2 wheeled) Transfers: Sit to/from Stand Sit to Stand: From elevated surface;Min guard Stand pivot transfers: Min guard       General transfer comment: Pt demos improved ability to complete transfers without assist, guarding for safety.  Ambulation/Gait                 Stairs             Wheelchair Mobility    Modified Rankin (Stroke Patients Only)       Balance Overall balance assessment: Needs assistance   Sitting balance-Leahy Scale: Normal     Standing balance support: Bilateral upper extremity supported Standing balance-Leahy Scale: Fair Standing balance comment: Pt able to maintain standing with BUE support without LOB, however, after 3 min of prolonged standing pt reports onset of shakiness, weakness, and generally became unstable requiring seated rest break                            Cognition Arousal/Alertness: Awake/alert Behavior During Therapy: WFL for tasks assessed/performed Overall Cognitive Status: Within Functional Limits for tasks assessed  General Comments: Pt cognition within normal limits, pt is cooperative and pleasant through session.      Exercises      General Comments        Pertinent Vitals/Pain Pain Assessment: No/denies pain(Pt reports intermittant feelings of weakness and  shakiness but does not mention pain.) Pain Score: 0-No pain Faces Pain Scale: Hurts whole lot Pain Location: R LE with movement Pain Descriptors / Indicators: Grimacing;Guarding Pain Intervention(s): Monitored during session;Repositioned;Patient requesting pain meds-RN notified;Premedicated before session    Home Living                      Prior Function            PT Goals (current goals can now be found in the care plan section) Acute Rehab PT Goals Patient Stated Goal: return home, increase walking endurance and independence PT Goal Formulation: With patient Time For Goal Achievement: 08/28/19 Potential to Achieve Goals: Good Progress towards PT goals: Progressing toward goals    Frequency    Min 3X/week      PT Plan Discharge plan needs to be updated    Co-evaluation     PT goals addressed during session: Mobility/safety with mobility;Strengthening/ROM        AM-PAC PT "6 Clicks" Mobility   Outcome Measure  Help needed turning from your back to your side while in a flat bed without using bedrails?: None Help needed moving from lying on your back to sitting on the side of a flat bed without using bedrails?: A Little Help needed moving to and from a bed to a chair (including a wheelchair)?: A Little Help needed standing up from a chair using your arms (e.g., wheelchair or bedside chair)?: A Little Help needed to walk in hospital room?: A Little Help needed climbing 3-5 steps with a railing? : A Lot 6 Click Score: 18    End of Session Equipment Utilized During Treatment: Gait belt Activity Tolerance: Treatment limited secondary to medical complications (Comment)(Advancement of endurance and gait training limited by changes in BP with changes in position (see vitals section)) Patient left: in bed Nurse Communication: Other (comment)(Communicated with RN Cristy regarding Pt change in BP with change in position.) PT Visit Diagnosis: Other abnormalities  of gait and mobility (R26.89);Pain;Difficulty in walking, not elsewhere classified (R26.2);Muscle weakness (generalized) (M62.81) Pain - Right/Left: Right Pain - part of body: Leg     Time: [id number] PT Time Calculation (min) (ACUTE ONLY): 40 min  Charges:  $Therapeutic Activity: 23-37 mins $Self Care/Home Management: 8-22                     Otho Bellows, PT, DPT

## 2019-08-15 NOTE — Progress Notes (Signed)
Inpatient Diabetes Program Recommendations  AACE/ADA: New Consensus Statement on Inpatient Glycemic Control (2015)  Target Ranges:  Prepandial:   less than 140 mg/dL      Peak postprandial:   less than 180 mg/dL (1-2 hours)      Critically ill patients:  140 - 180 mg/dL   Lab Results  Component Value Date   GLUCAP 129 (H) 08/15/2019   HGBA1C 12.6 (H) 07/07/2019    Review of Glycemic Control  Diabetes history: DM2 Outpatient Diabetes medications: metformin 500 mg bid Current orders for Inpatient glycemic control: Novolin N 28 units bid, Novolog 0-15 units tidwc and 0-5 units QHS + 8 units tidwc  HgbA1C - 12.6% - uncontrolled Will need to go home on insulin. Will need affordable insulin at discharge with no insurance coverage.  Inpatient Diabetes Program Recommendations:     Continue with current orders.  Spoke with pt at length regarding his HgbA1C of 12.6%. Understands he will go home on insulin. Will need prescription for glucose meter and supplies, insulin syringes, Novolin N and Humulin R insulins. Will f/u with PCP and take blood sugar log for MD to review and make adjustments if needed. Reviewed s/s and treatment of hypoglycemia, monitoring blood sugars 3-4x/day, getting a little exercise every day, and eating healthy using the plate method. Needs to be careful with portion control and eliminating simple CHOs. Pt seems motivated to make changes and states his children are his support. Will continue to follow while inpatient.   Ordered OP Diabetes Education consult.  Thank you. Lorenda Peck, RD, LDN, CDE Inpatient Diabetes Coordinator [phone]

## 2019-08-16 ENCOUNTER — Encounter (HOSPITAL_COMMUNITY): Payer: Self-pay | Admitting: *Deleted

## 2019-08-16 ENCOUNTER — Inpatient Hospital Stay (HOSPITAL_COMMUNITY)
Admission: RE | Admit: 2019-08-16 | Discharge: 2019-08-29 | DRG: 095 | Disposition: A | Discharge status: Home Health | Payer: Self-pay | Source: Intra-hospital | Attending: Physical Medicine and Rehabilitation | Admitting: Physical Medicine and Rehabilitation

## 2019-08-16 DIAGNOSIS — Z885 Allergy status to narcotic agent status: Secondary | ICD-10-CM

## 2019-08-16 DIAGNOSIS — Z6841 Body Mass Index (BMI) 40.0 and over, adult: Secondary | ICD-10-CM

## 2019-08-16 DIAGNOSIS — E669 Obesity, unspecified: Secondary | ICD-10-CM

## 2019-08-16 DIAGNOSIS — Z7984 Long term (current) use of oral hypoglycemic drugs: Secondary | ICD-10-CM

## 2019-08-16 DIAGNOSIS — M4620 Osteomyelitis of vertebra, site unspecified: Secondary | ICD-10-CM | POA: Diagnosis present

## 2019-08-16 DIAGNOSIS — E1169 Type 2 diabetes mellitus with other specified complication: Secondary | ICD-10-CM

## 2019-08-16 DIAGNOSIS — L02419 Cutaneous abscess of limb, unspecified: Secondary | ICD-10-CM

## 2019-08-16 DIAGNOSIS — E11649 Type 2 diabetes mellitus with hypoglycemia without coma: Secondary | ICD-10-CM | POA: Diagnosis not present

## 2019-08-16 DIAGNOSIS — Z791 Long term (current) use of non-steroidal anti-inflammatories (NSAID): Secondary | ICD-10-CM

## 2019-08-16 DIAGNOSIS — I82409 Acute embolism and thrombosis of unspecified deep veins of unspecified lower extremity: Secondary | ICD-10-CM | POA: Diagnosis present

## 2019-08-16 DIAGNOSIS — D62 Acute posthemorrhagic anemia: Secondary | ICD-10-CM | POA: Diagnosis present

## 2019-08-16 DIAGNOSIS — B9561 Methicillin susceptible Staphylococcus aureus infection as the cause of diseases classified elsewhere: Secondary | ICD-10-CM | POA: Diagnosis present

## 2019-08-16 DIAGNOSIS — L02415 Cutaneous abscess of right lower limb: Secondary | ICD-10-CM | POA: Diagnosis present

## 2019-08-16 DIAGNOSIS — R7881 Bacteremia: Secondary | ICD-10-CM | POA: Diagnosis present

## 2019-08-16 DIAGNOSIS — Z833 Family history of diabetes mellitus: Secondary | ICD-10-CM

## 2019-08-16 DIAGNOSIS — E1165 Type 2 diabetes mellitus with hyperglycemia: Secondary | ICD-10-CM | POA: Diagnosis present

## 2019-08-16 DIAGNOSIS — Z720 Tobacco use: Secondary | ICD-10-CM

## 2019-08-16 DIAGNOSIS — I10 Essential (primary) hypertension: Secondary | ICD-10-CM | POA: Diagnosis present

## 2019-08-16 DIAGNOSIS — R5381 Other malaise: Secondary | ICD-10-CM

## 2019-08-16 DIAGNOSIS — Z86718 Personal history of other venous thrombosis and embolism: Secondary | ICD-10-CM

## 2019-08-16 DIAGNOSIS — R600 Localized edema: Secondary | ICD-10-CM | POA: Diagnosis present

## 2019-08-16 DIAGNOSIS — E871 Hypo-osmolality and hyponatremia: Secondary | ICD-10-CM | POA: Diagnosis present

## 2019-08-16 DIAGNOSIS — G8918 Other acute postprocedural pain: Secondary | ICD-10-CM

## 2019-08-16 DIAGNOSIS — Z79899 Other long term (current) drug therapy: Secondary | ICD-10-CM

## 2019-08-16 DIAGNOSIS — G4733 Obstructive sleep apnea (adult) (pediatric): Secondary | ICD-10-CM | POA: Diagnosis present

## 2019-08-16 DIAGNOSIS — Z23 Encounter for immunization: Secondary | ICD-10-CM

## 2019-08-16 DIAGNOSIS — G061 Intraspinal abscess and granuloma: Principal | ICD-10-CM | POA: Diagnosis present

## 2019-08-16 DIAGNOSIS — K5903 Drug induced constipation: Secondary | ICD-10-CM | POA: Diagnosis not present

## 2019-08-16 DIAGNOSIS — B379 Candidiasis, unspecified: Secondary | ICD-10-CM | POA: Diagnosis not present

## 2019-08-16 DIAGNOSIS — N179 Acute kidney failure, unspecified: Secondary | ICD-10-CM | POA: Diagnosis present

## 2019-08-16 LAB — CBC WITH DIFFERENTIAL/PLATELET
Abs Immature Granulocytes: 0.02 10*3/uL (ref 0.00–0.07)
Basophils Absolute: 0 10*3/uL (ref 0.0–0.1)
Basophils Relative: 1 %
Eosinophils Absolute: 0.3 10*3/uL (ref 0.0–0.5)
Eosinophils Relative: 5 %
HCT: 26.1 % — ABNORMAL LOW (ref 39.0–52.0)
Hemoglobin: 7.8 g/dL — ABNORMAL LOW (ref 13.0–17.0)
Immature Granulocytes: 0 %
Lymphocytes Relative: 39 %
Lymphs Abs: 2.3 10*3/uL (ref 0.7–4.0)
MCH: 29 pg (ref 26.0–34.0)
MCHC: 29.9 g/dL — ABNORMAL LOW (ref 30.0–36.0)
MCV: 97 fL (ref 80.0–100.0)
Monocytes Absolute: 0.6 10*3/uL (ref 0.1–1.0)
Monocytes Relative: 10 %
Neutro Abs: 2.6 10*3/uL (ref 1.7–7.7)
Neutrophils Relative %: 45 %
Platelets: 222 10*3/uL (ref 150–400)
RBC: 2.69 MIL/uL — ABNORMAL LOW (ref 4.22–5.81)
RDW: 15.2 % (ref 11.5–15.5)
WBC: 5.8 10*3/uL (ref 4.0–10.5)
nRBC: 0 % (ref 0.0–0.2)

## 2019-08-16 LAB — RENAL FUNCTION PANEL
Albumin: 2.2 g/dL — ABNORMAL LOW (ref 3.5–5.0)
Anion gap: 9 (ref 5–15)
BUN: 12 mg/dL (ref 6–20)
CO2: 29 mmol/L (ref 22–32)
Calcium: 8.9 mg/dL (ref 8.9–10.3)
Chloride: 98 mmol/L (ref 98–111)
Creatinine, Ser: 0.58 mg/dL — ABNORMAL LOW (ref 0.61–1.24)
GFR calc Af Amer: >60 mL/min (ref >60)
GFR calc non Af Amer: >60 mL/min (ref >60)
Glucose, Bld: 125 mg/dL — ABNORMAL HIGH (ref 70–99)
Phosphorus: 4.8 mg/dL — ABNORMAL HIGH (ref 2.5–4.6)
Potassium: 3.9 mmol/L (ref 3.5–5.1)
Sodium: 136 mmol/L (ref 135–145)

## 2019-08-16 LAB — GLUCOSE, CAPILLARY
Glucose-Capillary: 150 mg/dL — ABNORMAL HIGH (ref 70–99)
Glucose-Capillary: 141 mg/dL — ABNORMAL HIGH (ref 70–99)
Glucose-Capillary: 114 mg/dL — ABNORMAL HIGH (ref 70–99)
Glucose-Capillary: 138 mg/dL — ABNORMAL HIGH (ref 70–99)

## 2019-08-16 LAB — MAGNESIUM: Magnesium: 1.9 mg/dL (ref 1.7–2.4)

## 2019-08-16 MED ORDER — RIVAROXABAN 20 MG PO TABS
20.0000 mg | ORAL_TABLET | Freq: Every day | ORAL | Status: DC
  Administered 2019-08-25 – 2019-08-28 (×4): 20 mg via ORAL
  Filled 2019-08-16 (×5): qty 1

## 2019-08-16 MED ORDER — ONDANSETRON HCL 4 MG PO TABS: 4.0000 mg | ORAL_TABLET | Freq: Four times a day (QID) | ORAL | Status: DC | PRN

## 2019-08-16 MED ORDER — GABAPENTIN 300 MG PO CAPS
300.0000 mg | ORAL_CAPSULE | Freq: Three times a day (TID) | ORAL | Status: DC
  Administered 2019-08-16 – 2019-08-29 (×38): 300 mg via ORAL
  Filled 2019-08-16 (×39): qty 1

## 2019-08-16 MED ORDER — CEFAZOLIN SODIUM-DEXTROSE 2-4 GM/100ML-% IV SOLN
2.0000 g | Freq: Three times a day (TID) | INTRAVENOUS | Status: DC
  Administered 2019-08-16 – 2019-08-28 (×35): 2 g via INTRAVENOUS
  Filled 2019-08-16 (×36): qty 100

## 2019-08-16 MED ORDER — ACETAMINOPHEN 325 MG PO TABS
650.0000 mg | ORAL_TABLET | Freq: Four times a day (QID) | ORAL | Status: DC | PRN
  Administered 2019-08-22 – 2019-08-27 (×7): 650 mg via ORAL
  Filled 2019-08-16 (×8): qty 2

## 2019-08-16 MED ORDER — ADULT MULTIVITAMIN W/MINERALS CH
1.0000 | ORAL_TABLET | Freq: Every day | ORAL | Status: DC
  Administered 2019-08-17 – 2019-08-29 (×13): 1 via ORAL
  Filled 2019-08-16 (×13): qty 1

## 2019-08-16 MED ORDER — ZOLPIDEM TARTRATE 5 MG PO TABS
5.0000 mg | ORAL_TABLET | Freq: Every evening | ORAL | Status: DC | PRN
  Administered 2019-08-17 – 2019-08-28 (×13): 5 mg via ORAL
  Filled 2019-08-16 (×12): qty 1

## 2019-08-16 MED ORDER — HYDRALAZINE HCL 25 MG PO TABS
25.0000 mg | ORAL_TABLET | Freq: Two times a day (BID) | ORAL | Status: DC
  Administered 2019-08-16 – 2019-08-29 (×25): 25 mg via ORAL
  Filled 2019-08-16 (×26): qty 1

## 2019-08-16 MED ORDER — METHOCARBAMOL 750 MG PO TABS
750.0000 mg | ORAL_TABLET | Freq: Three times a day (TID) | ORAL | Status: DC
  Administered 2019-08-16 – 2019-08-22 (×19): 750 mg via ORAL
  Filled 2019-08-16 (×20): qty 1

## 2019-08-16 MED ORDER — ONDANSETRON HCL 4 MG/2ML IJ SOLN: 4.0000 mg | Freq: Four times a day (QID) | INTRAMUSCULAR | Status: DC | PRN

## 2019-08-16 MED ORDER — INFLUENZA VAC SPLIT QUAD 0.5 ML IM SUSY
0.5000 mL | PREFILLED_SYRINGE | INTRAMUSCULAR | Status: AC
  Administered 2019-08-17: 0.5 mL via INTRAMUSCULAR

## 2019-08-16 MED ORDER — INSULIN NPH (HUMAN) (ISOPHANE) 100 UNIT/ML ~~LOC~~ SUSP
28.0000 [IU] | Freq: Two times a day (BID) | SUBCUTANEOUS | Status: DC
  Administered 2019-08-16 – 2019-08-29 (×25): 28 [IU] via SUBCUTANEOUS
  Filled 2019-08-16 (×3): qty 10

## 2019-08-16 MED ORDER — OXYCODONE HCL ER 40 MG PO T12A
40.0000 mg | EXTENDED_RELEASE_TABLET | Freq: Two times a day (BID) | ORAL | Status: DC
  Administered 2019-08-16 – 2019-08-29 (×26): 40 mg via ORAL
  Filled 2019-08-16: qty 4
  Filled 2019-08-16 (×2): qty 1
  Filled 2019-08-16: qty 4
  Filled 2019-08-16: qty 1
  Filled 2019-08-16 (×2): qty 4
  Filled 2019-08-16: qty 1
  Filled 2019-08-16: qty 4
  Filled 2019-08-16 (×3): qty 1
  Filled 2019-08-16 (×2): qty 4
  Filled 2019-08-16: qty 1
  Filled 2019-08-16: qty 4
  Filled 2019-08-16: qty 1
  Filled 2019-08-16: qty 4
  Filled 2019-08-16 (×3): qty 1
  Filled 2019-08-16: qty 4
  Filled 2019-08-16: qty 1
  Filled 2019-08-16 (×2): qty 4
  Filled 2019-08-16: qty 1
  Filled 2019-08-16: qty 4

## 2019-08-16 MED ORDER — INSULIN ASPART 100 UNIT/ML ~~LOC~~ SOLN
8.0000 [IU] | Freq: Three times a day (TID) | SUBCUTANEOUS | Status: DC
  Administered 2019-08-16 – 2019-08-28 (×23): 8 [IU] via SUBCUTANEOUS

## 2019-08-16 MED ORDER — POLYSACCHARIDE IRON COMPLEX 150 MG PO CAPS
150.0000 mg | ORAL_CAPSULE | Freq: Every day | ORAL | Status: DC
  Administered 2019-08-17 – 2019-08-29 (×14): 150 mg via ORAL
  Filled 2019-08-16 (×14): qty 1

## 2019-08-16 MED ORDER — SENNOSIDES-DOCUSATE SODIUM 8.6-50 MG PO TABS
1.0000 | ORAL_TABLET | Freq: Two times a day (BID) | ORAL | Status: DC
  Administered 2019-08-16 – 2019-08-18 (×5): 1 via ORAL
  Filled 2019-08-16 (×7): qty 1

## 2019-08-16 MED ORDER — FLEET ENEMA 7-19 GM/118ML RE ENEM: 1.0000 | ENEMA | Freq: Every day | RECTAL | Status: DC | PRN

## 2019-08-16 MED ORDER — CHLORHEXIDINE GLUCONATE CLOTH 2 % EX PADS
6.0000 | MEDICATED_PAD | Freq: Every day | CUTANEOUS | Status: DC
  Administered 2019-08-16 – 2019-08-17 (×2): 6 via TOPICAL

## 2019-08-16 MED ORDER — INSULIN ASPART 100 UNIT/ML ~~LOC~~ SOLN
0.0000 [IU] | Freq: Three times a day (TID) | SUBCUTANEOUS | Status: DC
  Administered 2019-08-17 – 2019-08-18 (×4): 2 [IU] via SUBCUTANEOUS
  Administered 2019-08-19: 3 [IU] via SUBCUTANEOUS
  Administered 2019-08-21 – 2019-08-29 (×9): 2 [IU] via SUBCUTANEOUS

## 2019-08-16 MED ORDER — RIVAROXABAN 15 MG PO TABS
15.0000 mg | ORAL_TABLET | Freq: Two times a day (BID) | ORAL | Status: AC
  Administered 2019-08-16 – 2019-08-25 (×18): 15 mg via ORAL
  Filled 2019-08-16 (×18): qty 1

## 2019-08-16 MED ORDER — SORBITOL 70 % SOLN
30.0000 mL | Freq: Every day | Status: DC | PRN
  Administered 2019-08-17 (×2): 30 mL via ORAL
  Filled 2019-08-16 (×2): qty 30

## 2019-08-16 MED ORDER — PNEUMOCOCCAL VAC POLYVALENT 25 MCG/0.5ML IJ INJ
0.5000 mL | INJECTION | INTRAMUSCULAR | Status: AC
  Administered 2019-08-18: 0.5 mL via INTRAMUSCULAR
  Filled 2019-08-16: qty 0.5

## 2019-08-16 MED ORDER — ALBUTEROL SULFATE (2.5 MG/3ML) 0.083% IN NEBU: 2.5000 mg | INHALATION_SOLUTION | Freq: Four times a day (QID) | RESPIRATORY_TRACT | Status: DC | PRN

## 2019-08-16 MED ORDER — OXYCODONE HCL 5 MG PO TABS
10.0000 mg | ORAL_TABLET | ORAL | Status: DC | PRN
  Administered 2019-08-16 – 2019-08-28 (×51): 10 mg via ORAL
  Filled 2019-08-16 (×51): qty 2

## 2019-08-16 MED ORDER — OXYCODONE HCL ER 40 MG PO T12A: 40.0000 mg | EXTENDED_RELEASE_TABLET | Freq: Two times a day (BID) | ORAL | Status: AC

## 2019-08-16 MED ORDER — POLYETHYLENE GLYCOL 3350 17 G PO PACK
17.0000 g | PACK | Freq: Two times a day (BID) | ORAL | Status: DC
  Administered 2019-08-16 – 2019-08-29 (×26): 17 g via ORAL
  Filled 2019-08-16 (×27): qty 1

## 2019-08-16 NOTE — Progress Notes (Signed)
Dressing to R hip soiled. New dressing applied. Pt tolerated well. Will continue to monitor.

## 2019-08-16 NOTE — Progress Notes (Signed)
Cristina Gong, RN  Rehab Admission Coordinator  Physical Medicine and Rehabilitation  PMR Pre-admission  Signed  Date of Service:  08/16/2019 10:51 AM      Related encounter: ED to Hosp-Admission (Discharged) from 07/05/2019 in [facility]         Show:Clear all [x] Manual[x] Template[x] Copied  Added by: [x] Cristina Gong, RN  [] Hover for details PMR Admission Coordinator Pre-Admission Assessment  Patient: Bradley Cox is an 48 y.o., male MRN: [id number] DOB: 1970-12-14 Height: 6\' 6"  (198.1 cm) Weight: (!) 164 kg                                                                                                                                                  Insurance Information HMO:     PPO:      PCP:      IPA:      80/20:     OTHER:  PRIMARY: Uninsured        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 He is aware that [facility] follow up and IV antibiotics are private pay.  Medicaid Application Date:       Case Manager:  Disability Application Date:       Case Worker:   The Data Collection Information Summary for patients in Inpatient Rehabilitation Facilities with attached Privacy Act [place] Records was provided and verbally reviewed with: N/A  Emergency Contact Information         Contact Information    Name Relation Home Work Mobile   SIMMERS,DEBIE Mother   [phone]   Maximillian, Shindle   [phone]   Kaream, Kisiel Father   [phone]     Current Medical History  Patient Admitting Diagnosis: Debility  History of Present Illness: 48 year old right-handed male with history of hypertension, diabetes mellitus, morbid obesity, OSA. Presented 07/06/2019 with complaints of right flank pain x3 weeks and recently seen in urgent care.  Patient did recently receive a shot of Toradol to the right buttocks as well as steroid Dosepak. Patient reports low back pain radiating to the right gluteus/thigh area with progressive decline in right lower extremity strength and difficulty in ambulation. Patient noted to have low-grade fever as well as tachycardia, tachypneic. Blood glucose was elevated with increased anion gap. 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 Placed on broad-spectrum antibiotics. Neurosurgery consulted did not feel needed surgical intervention and exam was consistent with peripheral nerve and lumbar plexus involvement. Underwent IR guided paraspinal abscess drainage per Dr. Doreatha Martin and blood culture completed showing MSSA. Infectious disease consulted advised  antibiotic therapy x6 to 8 weeks with Ancef through 08/27/2019. Echocardiogram and TEE completed showing ejection fraction [id number] normal systolic function no vegetation noted. Patient underwent I&D of large gluteal abscess and neuroplasty of sciatic nerve 07/09/2019 per Dr. Doreatha Martin followed by multiple right lower extremity I&D's and wound VAC placement with changes and later with I&D with closure removal of wound VAC 08/08/2019 and Hemovac drain placed that was later removed 08/10/2019 with persistent serosanguineous drainage from wound compressive wrap and dressing placed. Follow-up CT scan of the hip performed showing no abscess felt drainage most likely related to size of initial abscess . Hospital course complicated by right posterior tibial vein DVT 08/01/2019. CT angiogram of chest negative for pulmonary emboli and patient was started on Xarelto. Hospital course anemia patient has been transfused 2 units of packed red blood cells during hospital course latest hemoglobin 7.8.  Past Medical History      Past Medical History:  Diagnosis Date    Arthritis    Asthma    Diabetes mellitus, type 2 ([place])    Hypertension    Morbid obesity (HCC)    OSA (obstructive sleep apnea)     Family History  family history includes Diabetes in his paternal grandmother.  Prior Rehab/Hospitalizations:  Has the patient had prior rehab or hospitalizations prior to admission? Yes  Has the patient had major surgery during 100 days prior to admission? Yes  Current Medications   Current Facility-Administered Medications:    acetaminophen (TYLENOL) tablet 1,000 mg, 1,000 mg, Oral, TID, Patrecia Pace A, PA-C, 1,000 mg at 08/16/19 1037   albuterol (PROVENTIL) (2.5 MG/3ML) 0.083% nebulizer solution 2.5 mg, 2.5 mg, Nebulization, Q6H PRN, Ricci Barker, Sarah A, PA-C   ceFAZolin (ANCEF) IVPB 2g/100 mL premix, 2 g, Intravenous, Q8H, Delray Alt, PA-C, Last Rate: 200 mL/hr at 08/16/19 1041, 2 g at 08/16/19 1041   Chlorhexidine Gluconate Cloth 2 % PADS 6 each, 6 each, Topical, Daily, Delray Alt, PA-C, 6 each at 08/16/19 1037   diphenhydrAMINE (BENADRYL) injection 25 mg, 25 mg, Intravenous, Once PRN, Delray Alt, PA-C   gabapentin (NEURONTIN) capsule 300 mg, 300 mg, Oral, TID, Patrecia Pace A, PA-C, 300 mg at 08/16/19 1037   hydrALAZINE (APRESOLINE) injection 10 mg, 10 mg, Intravenous, Q6H PRN, Ricci Barker, Sarah A, PA-C   hydrALAZINE (APRESOLINE) tablet 25 mg, 25 mg, Oral, BID, Patrecia Pace A, PA-C, 25 mg at 08/16/19 1037   influenza vac split quadrivalent PF (FLUARIX) injection 0.5 mL, 0.5 mL, Intramuscular, Tomorrow-1000, Yacobi, Sarah A, PA-C   insulin aspart (novoLOG) injection 0-15 Units, 0-15 Units, Subcutaneous, TID WC, Delray Alt, PA-C, 2 Units at 08/16/19 [id number]   insulin aspart (novoLOG) injection 0-5 Units, 0-5 Units, Subcutaneous, QHS, Yacobi, Sarah A, PA-C, 2 Units at 08/08/19 2243   insulin aspart (novoLOG) injection 8 Units, 8 Units, Subcutaneous, TID WC, Delray Alt, PA-C, 8 Units at 08/16/19 0839   insulin  NPH Human (NOVOLIN N) injection 28 Units, 28 Units, Subcutaneous, BID AC & HS, Delray Alt, PA-C, 28 Units at 08/16/19 [id number]   iron polysaccharides (NIFEREX) capsule 150 mg, 150 mg, Oral, Daily, Patrecia Pace A, PA-C, 150 mg at 08/16/19 1037   methocarbamol (ROBAXIN) tablet 750 mg, 750 mg, Oral, TID, Patrecia Pace A, PA-C, 750 mg at 08/16/19 1037   metoCLOPramide (REGLAN) 5 MG/5ML solution 10 mg, 10 mg, Oral, TID AC & HS, Patrecia Pace A, PA-C, 10 mg at 08/16/19 [id number]   multivitamin with minerals tablet 1 tablet, 1  tablet, Oral, Daily, Delray Alt, PA-C, 1 tablet at 08/16/19 1037   ondansetron (ZOFRAN) tablet 4 mg, 4 mg, Oral, Q6H PRN **OR** ondansetron (ZOFRAN) injection 4 mg, 4 mg, Intravenous, Q6H PRN, Delray Alt, PA-C, 4 mg at 07/11/19 2034   oxyCODONE (Oxy IR/ROXICODONE) immediate release tablet 10 mg, 10 mg, Oral, Q4H PRN, Delray Alt, PA-C, 10 mg at 08/16/19 [id number]   oxyCODONE (OXYCONTIN) 12 hr tablet 40 mg, 40 mg, Oral, Q12H, Kyle, Tyrone A, DO, 40 mg at 08/16/19 1037   polyethylene glycol (MIRALAX / GLYCOLAX) packet 17 g, 17 g, Oral, BID, Patrecia Pace A, PA-C, 17 g at 08/16/19 1037   protein supplement (ENSURE MAX) liquid, 11 oz, Oral, TID BM, Yacobi, Sarah A, PA-C, 11 oz at 08/16/19 1037   Rivaroxaban (XARELTO) tablet 15 mg, 15 mg, Oral, BID, 15 mg at 08/16/19 1038 **FOLLOWED BY** [START ON 08/25/2019] rivaroxaban (XARELTO) tablet 20 mg, 20 mg, Oral, Q supper, Pierce, Dwayne A, RPH   senna-docusate (Senokot-S) tablet 1 tablet, 1 tablet, Oral, BID, Delray Alt, PA-C, 1 tablet at 08/16/19 1037   sodium chloride flush (NS) 0.9 % injection 10-40 mL, 10-40 mL, Intracatheter, Q12H, Yacobi, Sarah A, PA-C, 10 mL at 08/15/19 2110   sodium chloride flush (NS) 0.9 % injection 10-40 mL, 10-40 mL, Intracatheter, PRN, Patrecia Pace A, PA-C, 10 mL at 08/08/19 1649   zolpidem (AMBIEN) tablet 5 mg, 5 mg, Oral, QHS PRN, Patrecia Pace A, PA-C, 5 mg at 08/15/19 2313  Patients  Current Diet:     Diet Order                  Diet Carb Modified Fluid consistency: Thin; Room service appropriate? Yes  Diet effective now               Precautions / Restrictions Precautions Precautions: Fall Precaution Comments: very sensitive/wound present R posterior thigh, check BPs Restrictions Weight Bearing Restrictions: Yes RLE Weight Bearing: Weight bearing as tolerated   Has the patient had 2 or more falls or a fall with injury in the past year? 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  Self Care: Did the patient need help bathing, dressing, using the toilet or eating?  Independent  Indoor Mobility: Did the patient need assistance with walking from room to room (with or without device)? Independent  Stairs: Did the patient need assistance with internal or external stairs (with or without device)? Independent  Functional Cognition: Did the patient need help planning regular tasks such as shopping or remembering to take medications? Independent  Home Assistive Devices / Equipment Home Assistive Devices/Equipment: Gilford Rile (specify type) Home Equipment: None  Prior Device Use: Indicate devices/aids used by the patient prior to current illness, exacerbation or injury? None of the above  Current Functional Level Cognition  Overall Cognitive Status: Within Functional Limits for tasks assessed Orientation Level: Oriented X4 General Comments: Pt cognition within normal limits, pt is cooperative and pleasant through session.    Extremity Assessment (includes Sensation/Coordination)  Upper Extremity Assessment: Overall WFL for tasks assessed  Lower Extremity Assessment: Defer to PT evaluation RLE Deficits / Details: limited due to pain, less so that at initial eval; able to move LE along sheet RLE: Unable  to fully assess due to pain    ADLs  Overall ADL's : Needs assistance/impaired Eating/Feeding: Set up, Bed level Grooming: Oral care, Standing, Min  guard Upper Body Bathing: Set up, Bed level Lower Body Bathing: Moderate assistance, Bed level Upper Body Dressing : Set up, Sitting Lower Body Dressing: Minimal assistance, Sitting/lateral leans, With adaptive equipment Lower Body Dressing Details (indicate cue type and reason): instructed in use of wide sock aide and reacher Toilet Transfer: Minimal assistance, RW, Ambulation Toilet Transfer Details (indicate cue type and reason): unable to transfer d/t syncope episode at EOB Toileting- Clothing Manipulation and Hygiene: Maximal assistance Functional mobility during ADLs: Min guard, Rolling walker General ADL Comments: pt with one loss of balance while standing at EOB, did not require assistance to regain balance    Mobility  Overal bed mobility: Independent Bed Mobility: Rolling, Supine to Sit, Sit to Supine Rolling: Modified independent (Device/Increase time)(pt uses bed rails and elevated HOB) Sidelying to sit: Min assist, +2 for physical assistance, HOB elevated Supine to sit: Modified independent (Device/Increase time), Supervision Sit to supine: Min assist(minA for management of foley and minimal physical assist for RLE) Sit to sidelying: Mod assist, +2 for physical assistance General bed mobility comments: Pt able to complete transfers without assist, benefits from supervision for line management    Transfers  Overall transfer level: Needs assistance Equipment used: Rolling walker (2 wheeled) Transfers: Sit to/from Stand Sit to Stand: From elevated surface, Min guard Stand pivot transfers: Min guard General transfer comment: Pt demos improved ability to complete transfers without assist, guarding for safety.    Ambulation / Gait / Stairs / Wheelchair Mobility  Ambulation/Gait Ambulation/Gait assistance: Min guard, +2  safety/equipment Gait Distance (Feet): 30 Feet Assistive device: Rolling walker (2 wheeled) Gait Pattern/deviations: Step-through pattern, Decreased stride length, Trunk flexed, Decreased step length - left, Decreased stance time - right, Decreased weight shift to right, Wide base of support, Antalgic General Gait Details: pain R LE with movement, min guard x2 for safety and equipment management, fatigued at end of gait distance Gait velocity: decreased    Posture / Balance Dynamic Sitting Balance Sitting balance - Comments: able to sit without UE assist at EOB Balance Overall balance assessment: Needs assistance Sitting-balance support: No upper extremity supported, Feet supported Sitting balance-Leahy Scale: Normal Sitting balance - Comments: able to sit without UE assist at EOB Postural control: Left lateral lean Standing balance support: Bilateral upper extremity supported Standing balance-Leahy Scale: Fair Standing balance comment: Pt able to maintain standing with BUE support without LOB, however, after 3 min of prolonged standing pt reports onset of shakiness, weakness, and generally became unstable requiring seated rest break    Special needs/care consideration BiPAP/CPAP n/a CPM n/a Continuous Drip IV PICC placed 0000000 right basilic single lumen. Ancef with LOT 10/26. [facility] with [facility] for med and HHRN for PICC with [facility] Dialysis n/a Life Vest n/a Oxygen n/a Special Bed n/a Trach Size n/a Wound Vac n/a Skin right hip, right leg and right knee with surgical incision with serosanguineous drainage controlled with hip spica compression wraps ; right lower right surgical incision with dressing Bowel mgmt: continent LBM 10/13 continent Bladder mgmt: external catheter Diabetic mgm Hgb A1c 12.6, new to insulin this admission. Diabetes Coordinator has been working with patient on acute hospital. Will need prescription for glucose meter, supplies and syringes at  d/c Behavioral consideration patient anxious Chemo/radiation  N/a Designated visitor Mom, Debbie D/c to Dad's home eat [place]. Jori Moll F/u OP appointment already arranged with [facility] for 10/30 at 0930 by RN CM on Acute hospital [facility] with [facility] for antibiotic and [facility] for IV PICC care per RN CM acute hospital    Previous Home Environment  Living Arrangements: (lives with his 43 year old son)  Lives With: Family Available Help at Discharge: Family, Available 24 hours/day(to fo stay with his Dad in [place]) Type of Home: House Home Layout: One level Home Access: Stairs to enter Entrance Stairs-Rails: Right Entrance Stairs-Number of Steps: 3-4 Bathroom Shower/Tub: Multimedia programmer: Standard Bathroom Accessibility: Yes How Accessible: Accessible via walker [place]: No  Discharge Living Setting Plans for Discharge Living Setting: Lives with (comment)(to saty with his Dad in [place]) Type of Home at Discharge: House Discharge Home Layout: One level Discharge Home Access: Level entry Discharge Bathroom Shower/Tub: Tub/shower unit Discharge Bathroom Toilet: Standard Discharge Bathroom Accessibility: Yes How Accessible: Accessible via walker Does the patient have any problems obtaining your medications?: Yes (Describe)(uninsured, plans to get meds at [facility] at d/c)  Social/Family/Support Systems Patient Roles: 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, trucker self employed) Contact Information: Mom Debbie Anticipated Caregiver: Dad, 24 year old and friend who is retired Restaurant manager, fast food Information: Debbie and [place] Ability/Limitations of Caregiver: Dad 72 years old; friend is retired Therapist, sports to [employer] with IV home meds Caregiver Availability: 24/7 Discharge Plan Discussed with Primary Caregiver: Yes Is Caregiver In Agreement with Plan?: Yes Does Caregiver/Family have  Issues with Lodging/Transportation while Pt is in Rehab?: No  Goals/Additional Needs Patient/Family Goal for Rehab: supervision PT and OT Expected length of stay: ELOS 5 to 7 days Special Service Needs: IV antibiotics until 10/26. Has PICC Pt/Family Agrees to Admission and willing to participate: Yes Program Orientation Provided & Reviewed with Pt/Caregiver Including Roles  & Responsibilities: Yes  Decrease burden of Care through IP rehab admission: n/a  Possible need for SNF placement upon discharge:not anticipated  Patient Condition: This patient's medical and functional status has changed since the consult dated: 07/11/2019 in which the Rehabilitation Physician determined and documented that the patient's condition is appropriate for intensive rehabilitative care in an inpatient rehabilitation facility. See "History of Present Illness" (above) for medical update. Functional changes are: overall min guard assist very short distance. Patient's medical and functional status update has been discussed with the Rehabilitation physician and patient remains appropriate for inpatient rehabilitation. Will admit to inpatient rehab today.  Preadmission Screen Completed By:  Cleatrice Burke, RN, 08/16/2019 11:02 AM ______________________________________________________________________   Discussed status with Dr. Naaman Plummer on 08/16/2019 at 1118 and received approval for admission today.  Admission Coordinator:  Cleatrice Burke, time [id number] date 08/16/2019         Cosigned by: Meredith Staggers, MD at 08/16/2019 11:31 AM  Revision History

## 2019-08-16 NOTE — Plan of Care (Signed)

## 2019-08-16 NOTE — H&P (Signed)
Physical Medicine and Rehabilitation Admission H&P        Chief Complaint  Patient presents with   Back Pain  : HPI: Bradley Cox is a 48 year old right-handed male with history of hypertension, diabetes mellitus, morbid obesity, OSA.  Per chart review lives with son .  1 level home 3 steps to entry.  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 Plans to discharge home with family.  Presented 07/06/2019 with complaints of right flank pain x3 weeks and recently seen in urgent care.  Patient did recently receive a shot of Toradol to the right buttocks as well as steroid Dosepak.  Patient reports low back pain radiating to the right gluteus/thigh area with progressive decline in right lower extremity strength and difficulty in ambulation.  Patient noted to have low-grade fever as well as tachycardia, tachypneic.  Blood glucose was elevated with increased anion gap.  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  Placed on broad-spectrum antibiotics.  Neurosurgery consulted did not feel needed surgical intervention and exam was consistent with peripheral nerve and lumbar plexus involvement.  Underwent IR guided paraspinal abscess drainage per Dr. Doreatha Martin and blood culture completed showing MSSA.  Infectious disease consulted advised antibiotic therapy x6 to 8 weeks with Ancef through 08/27/2019.  Echocardiogram and TEE completed showing ejection fraction 03% normal systolic function no vegetation noted.  Patient underwent I&D of large gluteal abscess and neuroplasty of sciatic nerve 07/09/2019 per Dr. Doreatha Martin followed by multiple right lower extremity I&D's and wound VAC placement with changes and later with I&D with closure removal of wound VAC 08/08/2019 and Hemovac drain placed that was later removed 08/10/2019 with persistent serosanguineous drainage from wound compressive wrap and  dressing placed.  Follow-up CT scan of the hip performed showing no abscess felt drainage most likely related to size of initial abscess .  Hospital course complicated by right posterior tibial vein DVT 08/01/2019.  CT angiogram of chest negative for pulmonary emboli and patient was started on Xarelto.  Hospital course anemia patient has been transfused 2 units of packed red blood cells during hospital course latest hemoglobin 7.8.  Therapy evaluations completed and patient was admitted for a comprehensive rehab program.   Review of Systems  Constitutional: Negative for chills and fever.  HENT: Negative for hearing loss.   Eyes: Negative for blurred vision and double vision.  Respiratory: Negative for cough and shortness of breath.   Cardiovascular: Positive for leg swelling. Negative for chest pain and palpitations.  Gastrointestinal: Positive for constipation. Negative for heartburn, nausea and vomiting.  Genitourinary: Negative for dysuria, flank pain and hematuria.  Musculoskeletal: Positive for joint pain and myalgias.  Skin: Negative for rash.  Psychiatric/Behavioral: The patient has insomnia.   All other systems reviewed and are negative.       Past Medical History:  Diagnosis Date   Arthritis     Asthma     Diabetes mellitus, type 2 ([place])     Hypertension     Morbid obesity ([place])     OSA (obstructive sleep apnea)           Past Surgical History:  Procedure Laterality Date   APPLICATION OF WOUND VAC Right 07/17/2019    Procedure: Application Of Wound Vac;  Surgeon: Altamese Curtice, MD;  Location: [place];  Service: Orthopedics;  Laterality: Right;   APPLICATION OF WOUND VAC Right  07/20/2019    Procedure: Application Of Wound Vac;  Surgeon: Altamese Akeley, MD;  Location: [place];  Service: Orthopedics;  Laterality: Right;   APPLICATION OF WOUND VAC Right 07/27/2019    Procedure: Application Of Wound Vac;  Surgeon: Shona Needles, MD;  Location: [place];  Service: Orthopedics;   Laterality: Right;   HERNIA REPAIR        As a baby   I&D EXTREMITY Right 07/27/2019    Procedure: IRRIGATION AND DEBRIDEMENT EXTREMITY Right Leg;  Surgeon: Shona Needles, MD;  Location: [place];  Service: Orthopedics;  Laterality: Right;   I&D EXTREMITY Right 07/30/2019    Procedure: IRRIGATION AND DEBRIDEMENT EXTREMITY WITH WOUND VAC CHANGE;  Surgeon: Shona Needles, MD;  Location: [facility];  Service: Orthopedics;  Laterality: Right;  IRRIGATION AND DEBRIDEMENT EXTREMITY WITH WOUND VAC CHANGE   I&D EXTREMITY Right 08/08/2019    Procedure: IRRIGATION AND DEBRIDEMENT RIGHT EXTREMITY;  Surgeon: Shona Needles, MD;  Location: [place];  Service: Orthopedics;  Laterality: Right;   INCISION AND DRAINAGE ABSCESS Right 07/09/2019    Procedure: INCISION AND DRAINAGE ABSCESS RIGHT HIP/PELVIS;  Surgeon: Shona Needles, MD;  Location: [place];  Service: Orthopedics;  Laterality: Right;   INCISION AND DRAINAGE HIP Right 07/17/2019    Procedure: IRRIGATION AND DEBRIDEMENT HIP;  Surgeon: Altamese Martinsburg, MD;  Location: [facility] [place];  Service: Orthopedics;  Laterality: Right;   INCISION AND DRAINAGE HIP Right 07/20/2019    Procedure: Irrigation And Debridement Hip;  Surgeon: Altamese Rushville, MD;  Location: [place];  Service: Orthopedics;  Laterality: Right;   TEE WITHOUT CARDIOVERSION N/A 07/11/2019    Procedure: TRANSESOPHAGEAL ECHOCARDIOGRAM (TEE);  Surgeon: Sanda Klein, MD;  [facility] END[facility];  Service: Cardiovascular;  Laterality: N/A;         Family History  Problem Relation Age of Onset   Diabetes Paternal Grandmother      Social History:  reports that he has never smoked. His smokeless tobacco use includes snuff. He reports current alcohol use. He reports that he does not use drugs. Allergies:      Allergies  Allergen Reactions   Tramadol Anaphylaxis          Medications Prior to Admission  Medication Sig Dispense Refill   albuterol (VENTOLIN HFA) 108 (90 Base) MCG/ACT inhaler Inhale 1 puff into  the lungs every 6 (six) hours as needed for wheezing or shortness of breath.        hydrALAZINE (APRESOLINE) 50 MG tablet Take 50 mg by mouth 2 (two) times daily.       ibuprofen (ADVIL) 200 MG tablet Take 600 mg by mouth every 6 (six) hours as needed for headache or moderate pain.       metFORMIN (GLUCOPHAGE) 500 MG tablet Take 1 tablet (500 mg total) by mouth 2 (two) times daily with a meal. 60 tablet 0   Multiple Vitamin (THERA) TABS Take 2 tablets by mouth daily.       blood glucose meter kit and supplies KIT Dispense based on patient and insurance preference. Use up to four times daily as directed. (FOR ICD-9 250.00, 250.01). 1 each 0      Drug Regimen Review  Drug regimen was reviewed and remains appropriate with no significant issues identified   Home: Home Living Family/patient expects to be discharged to:: Private residence Living Arrangements: Parent, Children Available Help at Discharge: Family, Available PRN/intermittently Type of Home: House Home Access: Stairs to enter [employer] of Steps: 3-4  Entrance Stairs-Rails: Right Home Layout: One level Bathroom Shower/Tub: Multimedia programmer: Standard Home Equipment: None   Functional History: Prior Function Level of Independence: Independent Comments: Truck driver.   Functional Status:  Mobility: Bed Mobility Overal bed mobility: Needs Assistance Bed Mobility: Supine to Sit, Sit to Supine Rolling: Min assist, +2 for physical assistance Sidelying to sit: Min assist, +2 for physical assistance, HOB elevated Supine to sit: Min guard, HOB elevated Sit to supine: Min assist, HOB elevated(R LE management) Sit to sidelying: Mod assist, +2 for physical assistance General bed mobility comments: minimal assist for R LE management back to bed Transfers Overall transfer level: Needs assistance Equipment used: Rolling walker (2 wheeled) Transfers: Sit to/from Stand Sit to Stand: From elevated  surface, Min guard Stand pivot transfers: Mod assist, +2 physical assistance, +2 safety/equipment General transfer comment: min guard x2 for safety, rocking and extended time Ambulation/Gait Ambulation/Gait assistance: Min guard, +2 safety/equipment Gait Distance (Feet): 30 Feet Assistive device: Rolling walker (2 wheeled) Gait Pattern/deviations: Step-through pattern, Decreased stride length, Trunk flexed, Decreased step length - left, Decreased stance time - right, Decreased weight shift to right, Wide base of support, Antalgic General Gait Details: pain R LE with movement, min guard x2 for safety and equipment management, fatigued at end of gait distance Gait velocity: decreased   ADL: ADL Overall ADL's : Needs assistance/impaired Eating/Feeding: Set up, Bed level Grooming: Set up Upper Body Bathing: Set up, Bed level Lower Body Bathing: Moderate assistance, Bed level Upper Body Dressing : Set up, Sitting Lower Body Dressing: Moderate assistance, Bed level Lower Body Dressing Details (indicate cue type and reason): total A to adjust socks in standing Toilet Transfer: Minimal assistance, RW, Ambulation Toilet Transfer Details (indicate cue type and reason): unable to transfer d/t syncope episode at [place] and Hygiene: Maximal assistance Functional mobility during ADLs: Minimal assistance, Rolling walker, Cueing for safety General ADL Comments: Pt continues to present with decreased functional performance and is limited by pain; pt with syncope episode at EOB reporting he feels "weird" pt briefly unresponsive for ~ 10 seconds; therapists assisted back to bed with BP taken ( 123/76) Pt continued to be frustrated with limited progress   Cognition: Cognition Overall Cognitive Status: Within Functional Limits for tasks assessed Orientation Level: Oriented X4 Cognition Arousal/Alertness: Awake/alert Behavior During Therapy: WFL for tasks  assessed/performed Overall Cognitive Status: Within Functional Limits for tasks assessed General Comments: frustrated about length of hospital stay and still being admitted   Physical Exam: Blood pressure 129/72, pulse 97, temperature 98.5 F (36.9 C), temperature source Oral, resp. rate 19, height '6\' 6"'  (1.981 m), weight (!) 164 kg, SpO2 97 %. Physical Exam  Constitutional: He is oriented to person, place, and time. He appears well-developed and well-nourished. No distress.  obese  HENT:  Head: Normocephalic and atraumatic.  Eyes: Pupils are equal, round, and reactive to light. EOM are normal.  Neck: Normal range of motion. No tracheal deviation present. No thyromegaly present.  Cardiovascular: Normal rate and regular rhythm. Exam reveals no friction rub.  Respiratory: Effort normal. No respiratory distress. He has no wheezes.  GI: Soft. He exhibits no distension. There is no abdominal tenderness.  Musculoskeletal:        General: Edema (RLE) present.  Neurological: He is alert and oriented to person, place, and time.  Patient is alert in no acute distress.  Follows full commands fair awareness of deficits. UE 5/5. RLE 2-/5 HF, KE and 4/5 ADF/PF. LLE: 4/5  prox to 5/5 distally. No focal sensory findings.   Skin:  Dressing in place to right lower extremity there is a small amount of serosanguineous drainage without odor. Right hip with large padded dressing.   Psychiatric: He has a normal mood and affect. His behavior is normal. Thought content normal.      Lab Results Last 48 Hours        Results for orders placed or performed during the hospital encounter of 07/05/19 (from the past 48 hour(s))  Glucose, capillary     Status: Abnormal    Collection Time: 08/13/19  8:07 AM  Result Value Ref Range    Glucose-Capillary 138 (H) 70 - 99 mg/dL  Glucose, capillary     Status: Abnormal    Collection Time: 08/13/19 12:34 PM  Result Value Ref Range    Glucose-Capillary 126 (H) 70 - 99 mg/dL   Glucose, capillary     Status: Abnormal    Collection Time: 08/13/19  5:18 PM  Result Value Ref Range    Glucose-Capillary 104 (H) 70 - 99 mg/dL  CBC     Status: Abnormal    Collection Time: 08/13/19  8:27 PM  Result Value Ref Range    WBC 5.5 4.0 - 10.5 K/uL    RBC 2.66 (L) 4.22 - 5.81 MIL/uL    Hemoglobin 7.6 (L) 13.0 - 17.0 g/dL    HCT 26.1 (L) 39.0 - 52.0 %    MCV 98.1 80.0 - 100.0 fL    MCH 28.6 26.0 - 34.0 pg    MCHC 29.1 (L) 30.0 - 36.0 g/dL    RDW 15.0 11.5 - 15.5 %    Platelets 240 150 - 400 K/uL    nRBC 0.0 0.0 - 0.2 %      Comment: Performed at [facility], [address], [place], [place] [address]  Basic metabolic panel     Status: Abnormal    Collection Time: 08/13/19  8:27 PM  Result Value Ref Range    Sodium 136 135 - 145 mmol/L    Potassium 3.8 3.5 - 5.1 mmol/L    Chloride 99 98 - 111 mmol/L    CO2 29 22 - 32 mmol/L    Glucose, Bld 152 (H) 70 - 99 mg/dL    BUN 7 6 - 20 mg/dL    Creatinine, Ser 0.67 0.61 - 1.24 mg/dL    Calcium 8.5 (L) 8.9 - 10.3 mg/dL    GFR calc non Af Amer >60 >60 mL/min    GFR calc Af Amer >60 >60 mL/min    Anion gap 8 5 - 15      Comment: Performed at [facility] [address], [place], [place] [address]  Glucose, capillary     Status: Abnormal    Collection Time: 08/13/19  9:34 PM  Result Value Ref Range    Glucose-Capillary 131 (H) 70 - 99 mg/dL  CBC     Status: Abnormal    Collection Time: 08/14/19  3:27 AM  Result Value Ref Range    WBC 5.2 4.0 - 10.5 K/uL    RBC 2.57 (L) 4.22 - 5.81 MIL/uL    Hemoglobin 7.6 (L) 13.0 - 17.0 g/dL    HCT 25.2 (L) 39.0 - 52.0 %    MCV 98.1 80.0 - 100.0 fL    MCH 29.6 26.0 - 34.0 pg    MCHC 30.2 30.0 - 36.0 g/dL    RDW 15.3 11.5 - 15.5 %  Platelets 219 150 - 400 K/uL    nRBC 0.4 (H) 0.0 - 0.2 %      Comment: Performed at [facility], [place] [address] [place], [place] [address]  Basic metabolic panel     Status: Abnormal    Collection Time: 08/14/19  3:27 AM  Result  Value Ref Range    Sodium 137 135 - 145 mmol/L    Potassium 3.9 3.5 - 5.1 mmol/L    Chloride 97 (L) 98 - 111 mmol/L    CO2 28 22 - 32 mmol/L    Glucose, Bld 115 (H) 70 - 99 mg/dL    BUN 8 6 - 20 mg/dL    Creatinine, Ser 0.53 (L) 0.61 - 1.24 mg/dL    Calcium 8.8 (L) 8.9 - 10.3 mg/dL    GFR calc non Af Amer >60 >60 mL/min    GFR calc Af Amer >60 >60 mL/min    Anion gap 12 5 - 15      Comment: Performed at [facility], [place] [address] [place], [place] [address]  Glucose, capillary     Status: Abnormal    Collection Time: 08/14/19  7:53 AM  Result Value Ref Range    Glucose-Capillary 145 (H) 70 - 99 mg/dL  Glucose, capillary     Status: Abnormal    Collection Time: 08/14/19 12:04 PM  Result Value Ref Range    Glucose-Capillary 137 (H) 70 - 99 mg/dL  Glucose, capillary     Status: Abnormal    Collection Time: 08/14/19  5:11 PM  Result Value Ref Range    Glucose-Capillary 112 (H) 70 - 99 mg/dL  Glucose, capillary     Status: Abnormal    Collection Time: 08/14/19  9:04 PM  Result Value Ref Range    Glucose-Capillary 135 (H) 70 - 99 mg/dL    Comment 1 Notify RN      Comment 2 Document in Chart       Imaging Results (Last 48 hours)  No results found.           Medical Problem List and Plan: 1.  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  Status post multiple I&D's as well as wound VAC changes.  Hemovac drain removed 08/10/2019             -admit to inpatient rehab 2.  Antithrombotics: -DVT/anticoagulation: Xarelto for right posterior tibial vein DVT identified on Dopplers 08/01/2019.             -antiplatelet therapy: N/A 3. Pain Management: OxyContin 40 mg every 12 hours, Neurontin 300 mg 3 times daily, Robaxin 750 mg 3 times daily, oxycodone as needed 4. Mood: Provide emotional support             -antipsychotic agents: N/A 5. Neuropsych: This  patient is capable of making decisions on his own behalf. 6. Skin/Wound Care: Compressive wrap and dressing changes as needed to thigh/hip wounds.  7. Fluids/Electrolytes/Nutrition: Routine in and outs with follow-up chemistries 8.  ID/MSSA bacteremia/disseminated infection.  Continue IV Ancef through 08/27/2019 9.  Acute blood loss anemia.  Follow-up CBC.  Continue iron supplement 10.  Diabetes mellitus.  Latest hemoglobin A1c 12.6.  NovoLog 8 units 3 times daily with meals, insulin NPH 28 units twice daily.               -check CBG's tid ac/hs             -  adjust regimen as needed.  11.  Hypertension.  Hydralazine 25 mg twice daily.  Monitor with increased mobility 12.  Morbid obesity.  BMI 40-49.9.  Dietary follow-up 13.  Constipation.  Laxative assistance      Cathlyn Parsons, PA-C 08/15/2019   I have personally performed a face to face diagnostic evaluation of this patient and formulated the key components of the plan.  Additionally, I have personally reviewed laboratory data, imaging studies, as well as relevant notes and concur with the physician assistant's documentation above.  The patient's status has not changed from the original H&P.  Any changes in documentation from the acute care chart have been noted above.  Meredith Staggers, MD, [facility]

## 2019-08-16 NOTE — TOC Transition Note (Signed)
Transition of Care [facility]) - CM/SW Discharge Note   Patient Details  Name: Bradley Cox MRN: [id number] Date of Birth: 1971-02-27  [facility] CM/SW Contact:  Alexander Mt, [place] Phone Number: 08/16/2019, 12:25 PM   Clinical Narrative:    Pt accepted at [facility]; per attending, ortho MD and PA pt is appropriate for dc today. CSW/RNCM signing off.    Final next level of care: IP Rehab Facility Barriers to Discharge: Barriers Resolved   Patient Goals and CMS Choice Patient states their goals for this hospitalization and ongoing recovery are:: to go home CMS Medicare.gov Compare Post Acute Care list provided to:: Patient Choice offered to / list presented to : Patient  Discharge Placement  CIR  Discharge Plan and Services In-house Referral: Clinical Social Work Discharge Planning Services: CM Consult Post Acute Care Choice: Home Health, Durable Medical Equipment          DME Arranged: 3-N-1, Walker rolling DME Agency: [employer] Date DME Agency Contacted: 08/06/19 Time DME Agency Contacted: 25 Representative spoke with at DME Agency: [place]: RN, PT, Social Work [employer] Agency: [place] ([place]) Date [place]: 08/06/19 Time [place]: [id number] Representative spoke with at [place]: [place] ([place]) Interventions     Readmission Risk Interventions No flowsheet data found.

## 2019-08-16 NOTE — Discharge Summary (Signed)
. Physician Discharge Summary  Bradley Cox MCN:[id number] DOB: Jul 25, 1971 DOA: 07/05/2019  PCP: Simona Huh, NP  Admit date: 07/05/2019 Discharge date: 08/16/2019  Admitted From: Home Disposition:  Discharged to inpatient rehab  Recommendations for Outpatient Follow-up:  1. Follow up with PCP in 1-2 weeks 2. Please obtain BMP/CBC in one week  Discharge Condition: Stable  CODE STATUS: FULL   Brief/Interim Summary: 48 year old gentleman with history of diabetes, essential hypertension, asthma presents with fever was found to have sepsis from MSSA infection with L3-S1 paraspinal abscess, right iliopsoas and right posterior thigh abscesses.  He underwent serial debridement by orthopedics.  Infectious disease consulted and recommended to continue with Ancef till 08/27/2019.  Hospital course complicated by right lower extremity DVT and he was started on Xarelto.  He also underwent repeat I&D on 10/7. Post op hemo vac was removed on 08/10/2019.But he had a lot of sero sanguineous discharge from the wound and the bandages were soaked.  Orthopedics requested to re evaluate the patient. He was placed on compression wraps/ bandages on the right lower extremity .  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     - undergone serial debridements of the right thigh abscess by orthopedics.       - He also underwent CT-guided  drainage of the paraspinal abscess by IR.     - Infectious disease consulted and recommended to continue with IV Ancef till 08/27/2019.  As patient developed significant output from the incisional wound [facility] orth[facility]ics did a repeat incision and irrigation and drainage and wound closure on 08/08/2019.      - Hemovac was removed by orthopedics on 08/10/2019 but patient reports worsening  of serosanguineous discharge from the right lower extremity wound.       - He currently has compression bandages on his right LL; improved this morning     - ok for d/c to CIR; CIR is willing to accept, we will transfer.  Anemia of chronic disease/anemia acute blood loss from repeated irrigation and drainage     - Transfuse to keep hemoglobin greater than 7.     - Hgb stable at 7.8  DVT of the right lower extremity secondary to limited ambulation.     - Patient required 2 units of PRBC transfusion already and he was started on Xarelto on 08/04/2019.   DKA DM     - DKA resolved, continue novolin, novolog SSI   Essential hypertension      - hydralazine  Asthma     - asymptomatic, sats ok on RA   Obesity     - Outpatient follow-up with PCP for diet and exercise.  Hypokalemia     - Replaced; follow   Constipation:     - on reglan, miralax, senakot     - should also improve as we wean narcotics and get him more mobile    Discharge Instructions  Discharge  Instructions    Ambulatory referral to Nutrition and Diabetic Education   Complete by: As directed    Home infusion instructions [facility] May follow [facility] Dosing Protocol; May administer Cathflo as needed to maintain patency of vascular access device.; Flushing of vascular access device: per [facility] Protocol: 0.9% NaCl pre/post medica...   Complete by: As directed    Instructions: May follow [place] Dosing Protocol   Instructions: May administer Cathflo as needed to maintain patency of vascular access device.    Instructions: Flushing of vascular access device: per [facility] Protocol: 0.9% NaCl pre/post medication administration and prn patency; Heparin 100 u/ml, 99m for implanted ports and Heparin 10u/ml, 527mfor all other central venous catheters.   Instructions: May follow [facility] Anaphylaxis Protocol for First Dose Administration in the home: 0.9% NaCl at 25-50 ml/hr to maintain IV access for protocol meds. Epinephrine 0.3 ml IV/IM PRN and Benadryl 25-50 IV/IM PRN s/s of anaphylaxis.   Instructions: AdPiedra Aguzanfusion Coordinator (RN) to assist per patient IV care needs in the home PRN.     Allergies as of 08/16/2019      Reactions   Toradol [ketorolac Tromethamine]    SOB, sweating. Tolerates Ibuprofen. Wife reports reaction may have been due to injection.      Medication List    STOP taking these medications   ibuprofen 200 MG tablet Commonly known as: ADVIL     TAKE these medications   albuterol 108 (90 Base) MCG/ACT inhaler Commonly known as: VENTOLIN HFA Inhale 1 puff into the lungs every 6 (six) hours as needed for wheezing or shortness of breath.   blood glucose meter kit and supplies Kit Dispense based on patient and insurance preference. Use up to four times daily as directed. (FOR ICD-9 250.00, 250.01).   ceFAZolin  IVPB Commonly known as: ANCEF Inject 2 g into the vein every 8 (eight) hours for 27 days. Indication:  MSSA/Epidural Abscess Last Day of Therapy:  08/27/2019 Labs - Once weekly:  CBC/D and BMP, Labs - Every other week:  ESR and CRP   gabapentin 300 MG capsule Commonly known as: NEURONTIN Take 1 capsule (300 mg total) by mouth 3 (three) times daily.   hydrALAZINE 50 MG tablet Commonly known as: APRESOLINE Take 50 mg by mouth 2 (two) times daily.   insulin NPH Human 100 UNIT/ML injection Commonly known as: NOVOLIN N Inject 0.28 mLs (28 Units total) into the skin 2 (two) times daily at 8 am and 10 pm.   iron polysaccharides 150 MG capsule Commonly known as:  NIFEREX Take 1 capsule (150 mg total) by mouth daily.   metFORMIN 500 MG tablet Commonly known as: GLUCOPHAGE Take 1 tablet (500 mg total) by mouth 2 (two) times daily with a meal.   methocarbamol 750 MG tablet Commonly known as: ROBAXIN Take 1 tablet (750 mg total) by mouth every 8 (eight) hours as needed for muscle spasms.   oxyCODONE 40 mg 12 hr tablet Commonly known as: OXYCONTIN Take 1 tablet (40 mg total) by mouth every 12 (twelve) hours for 5 days.   polyethylene glycol 17 g packet Commonly known as: MIRALAX / GLYCOLAX Take 17 g by mouth daily as needed.   Rivaroxaban 15 & 20 MG Tbpk Follow package directions: Take one 1561mablet by mouth twice a day. On day 22, switch to one 57m27mblet once a day. Take with food.   senna-docusate 8.6-50 MG tablet Commonly known as: Senokot-S Take 1 tablet by  mouth 2 (two) times daily.   Thera Tabs Take 2 tablets by mouth daily.     ASK your doctor about these medications   Oxycodone HCl 10 MG Tabs Take 1 tablet (10 mg total) by mouth every 6 (six) hours as needed for up to 3 days for moderate pain. Ask about: Should I take this medication?            Home Infusion Instuctions  (From admission, onward)         Start     Ordered   08/10/19 0000  Home infusion instructions [facility] May follow [facility] Dosing Protocol; May administer Cathflo as needed to maintain patency of vascular access device.; Flushing of vascular access device: per [facility] Protocol: 0.9% NaCl pre/post medica...    Question Answer Comment  Instructions May follow [place] Dosing Protocol   Instructions May administer Cathflo as needed to maintain patency of vascular access device.   Instructions Flushing of vascular access device: per [facility] Protocol: 0.9% NaCl pre/post medication administration and prn patency; Heparin 100 u/ml, 51m for implanted ports and Heparin 10u/ml, 562mfor all other central venous catheters.   Instructions May follow  [facility] Anaphylaxis Protocol for First Dose Administration in the home: 0.9% NaCl at 25-50 ml/hr to maintain IV access for protocol meds. Epinephrine 0.3 ml IV/IM PRN and Benadryl 25-50 IV/IM PRN s/s of anaphylaxis.   Instructions [facility] Infusion Coordinator (RN) to assist per patient IV care needs in the home PRN.      08/10/19 1310           Durable Medical Equipment  (From admission, onward)         Start     Ordered   08/06/19 0837  For home use only DME standard manual wheelchair with seat cushion  Once    Comments: Patient suffers from Sepsis secondary to disseminated MSSA infection including bacteremia, L3-S1 paraspinal abscess, right psoas abscess right posterior thigh abscesswhich impairs their ability to perform daily activities like ambulating  in the home.  A cane  will not resolve issue with performing activities of daily living. A wheelchair will allow patient to safely perform daily activities. Patient can safely propel the wheelchair in the home or has a caregiver who can provide assistance. Length of need lifetime  Accessories: elevating leg rests (ELRs), wheel locks, extensions and anti-tippers.  Seat and back cushions   Extra heavy duty   08/06/19 0838   08/06/19 0810  For home use only DME Walker rolling  Once    Question:  Patient needs a walker to treat with the following condition  Answer:  Weakness   08/06/19 0809   08/06/19 0810  For home use only DME 3 n 1  Once     08/06/19 [id number]       Follow-up Information    Comer, RoOkey RegalMD Follow up on 08/23/2019.   Specialty: Infectious Diseases Why: 9:30 a.m. appointment. Please arrive 15 minutes prior to so you may complete registration.  Contact information: [id number]   Why: follow up appointment on August 31, 2019 at 0930 am  Contact information: [address] [place]  [place] [phone]     HaShona NeedlesMD. Schedule an appointment as soon as possible for a visit in 2 weeks.  Specialty: Orthopedic Surgery Why: wound check, suture removal Contact information: [place] [address] [phone]        Simona Huh, NP. Schedule an appointment as soon as possible for a visit in 1 week(s).   Specialty: Nurse Practitioner Contact information: [place] [address] [phone]          Allergies  Allergen Reactions  . Toradol [Ketorolac Tromethamine]     SOB, sweating. Tolerates Ibuprofen. Wife reports reaction may have been due to injection.    Consultations:  Orthopedics  ID   Procedures/Studies: Ct Angio Chest Pe W Or Wo Contrast  Result Date: 08/01/2019 CLINICAL DATA:  48 year old male with syncope. EXAM: CT ANGIOGRAPHY CHEST WITH CONTRAST TECHNIQUE: Multidetector CT imaging of the chest was performed using the standard protocol during bolus administration of intravenous contrast. Multiplanar CT image reconstructions and MIPs were obtained to evaluate the vascular anatomy. CONTRAST:  135m OMNIPAQUE IOHEXOL 350 MG/ML SOLN COMPARISON:  None. FINDINGS: Cardiovascular: Borderline cardiomegaly. No pericardial effusion. Multi vessel coronary vascular calcification. The thoracic aorta is unremarkable. Evaluation of the pulmonary arteries is somewhat limited due to suboptimal opacification and visualization of the peripheral branches and respiratory motion artifact. No large or central pulmonary artery embolus identified. Right sided PICC with tip close to the cavoatrial junction noted. Mediastinum/Nodes: There is no hilar or mediastinal adenopathy. The esophagus and the thyroid gland are grossly unremarkable. No mediastinal fluid collection. Lungs/Pleura: Bibasilar linear atelectasis/scarring. No lobar consolidation, pleural effusion pneumothorax. The central airways are patent. Upper  Abdomen: Multiple gallstones. The visualized upper abdomen is otherwise unremarkable. Musculoskeletal: No chest wall abnormality. No acute or significant osseous findings. Review of the MIP images confirms the above findings. IMPRESSION: 1. No acute intrathoracic pathology. No CT evidence of central pulmonary artery embolus. 2. Cholelithiasis. Aortic Atherosclerosis (ICD10-I70.0). Electronically Signed   By: AAnner CreteM.D.   On: 08/01/2019 20:55   Ct Hip Right Wo Contrast  Result Date: 07/30/2019 CLINICAL DATA:  Right hip abscess. EXAM: CT OF THE RIGHT HIP WITHOUT CONTRAST TECHNIQUE: Multidetector CT imaging of the right hip was performed according to the standard protocol. Multiplanar CT image reconstructions were also generated. COMPARISON:  None. FINDINGS: Bones/Joint/Cartilage Widening of the right sacroiliac joint with erosive changes most concerning for septic arthritis. Nondisplaced fracture of the inferior aspect of the right sacrum adjacent to the sacroiliac joint. Subchondral fracture involving the right ilium along the right SI joint. Small amount air along the anterior margin of the right sacroiliac joint within the iliopsoas muscle. No acute fracture or dislocation.  Normal alignment. Ligaments Ligaments are suboptimally evaluated by CT. Soft tissue, Muscles and Tendons Soft tissue wound overlying the right greater trochanter with air within the subcutaneous fat. There has been significant interval improvement in the complex fluid collection involving the right gluteal musculature extending into the pelvis. Small amount air in the left semimembranosus muscle partially visualized. No fluid collection or hematoma.  No soft tissue mass. IMPRESSION: 1. 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 Interval development of a subchondral fracture involving the right ilium and a nondisplaced fracture along the inferior aspect of the right sacrum. 2. Soft tissue wound overlying the right  greater trochanter with air within the subcutaneous fat likely postsurgical. Significant interval improvement in the complex fluid collection involving the right gluteal musculature extending into the pelvis. 3. Small amount air in the left semimembranosus muscle partially visualized most concerning for persistent infection in the absence recent instrumentation involving  this area. Electronically Signed   By: Kathreen Devoid   On: 07/30/2019 16:56   Mr Tibia Fibula Right W Wo Contrast  Result Date: 07/26/2019 CLINICAL DATA:  drainage posteromedial R knee, eval for abscess. Contacted by nurse regarding new drainage from posteromedial proximal lower R leg. Started this am Pt notes pain only when touching areaNo other concerns elsewhere Currently on ancef for MSSA from R gluteal/hip abscess EXAM: MRI OF LOWER RIGHT EXTREMITY WITHOUT AND WITH CONTRAST TECHNIQUE: Multiplanar, multisequence MR imaging of the right lower extremity was performed both before and after administration of intravenous contrast. CONTRAST:  24m GADAVIST GADOBUTROL 1 MMOL/ML IV SOLN COMPARISON:  None. FINDINGS: Soft tissues: Large complex fluid collection extends from the thigh across the posterior knee. It begins above the included field of view and thus is entire extent is not imaged on this exam. The collection terminates in the posterosuperior calf, below the knee. There is peripheral wall enhancement and septal enhancement throughout the collection. Small foci of signal void are noted within the fluid collection consistent with bubbles of air. Transversely, the collection measures a maximum of 11 x 9 cm. The collection is at least 27 cm from superior to inferior, but extends above the included field of view and therefore is longer from superior to inferior. The collection is centered on the hamstrings. The neurovascular structures are displaced anteriorly. There is diffuse surrounding soft tissue edema/cellulitis. Osseous structures: Normal  signal. No evidence of edema or osteomyelitis. Right knee joint is normally spaced and aligned. No joint effusion. Visualized tendons are intact, normal in signal. IMPRESSION: 1. Large complex collection consistent with an abscess extending throughout the entire visible posterior thigh, centered on the posterior compartment involving the hamstring musculature. The collection measures 11 x 9 cm transversely. It extends through the visualized thigh across the knee to the upper leg. There is surrounding soft tissue edema/cellulitis. 2. No evidence of osteomyelitis. Knee joint is unremarkable with no effusion to suggest septic arthritis. Electronically Signed   By: DLajean ManesM.D.   On: 07/26/2019 21:17   Vas [place] Extremity Venous (dvt)  Result Date: 08/01/2019  Lower Venous Study Indications: Follow up dvt.  Limitations: Bandages and open wound. Comparison Study: previous study done 9/25 Performing Technologist: MAbram SanderRVS  Examination Guidelines: A complete evaluation includes B-mode imaging, spectral Doppler, color Doppler, and power Doppler as needed of all accessible portions of each vessel. Bilateral testing is considered an integral part of a complete examination. Limited examinations for reoccurring indications may be performed as noted.  +---------+---------------+---------+-----------+----------+-----------------+ RIGHT    CompressibilityPhasicitySpontaneityPropertiesThrombus Aging    +---------+---------------+---------+-----------+----------+-----------------+ CFV      Full           Yes      Yes                                    +---------+---------------+---------+-----------+----------+-----------------+ SFJ      Full                                                           +---------+---------------+---------+-----------+----------+-----------------+ FV Prox  Full                                                            +---------+---------------+---------+-----------+----------+-----------------+  FV Mid   Full                                                           +---------+---------------+---------+-----------+----------+-----------------+ FV DistalFull                                                           +---------+---------------+---------+-----------+----------+-----------------+ POP                                                   Not visualized    +---------+---------------+---------+-----------+----------+-----------------+ PTV      None                                         Age Indeterminate +---------+---------------+---------+-----------+----------+-----------------+ PERO                                                  Not visualized    +---------+---------------+---------+-----------+----------+-----------------+   +----+---------------+---------+-----------+----------+--------------+ LEFTCompressibilityPhasicitySpontaneityPropertiesThrombus Aging +----+---------------+---------+-----------+----------+--------------+ CFV Full           Yes      Yes                                 +----+---------------+---------+-----------+----------+--------------+     Summary: Right: Findings consistent with age indeterminate deep vein thrombosis involving the right posterior tibial veins. No cystic structure found in the popliteal fossa. Left: No evidence of common femoral vein obstruction.  *See table(s) above for measurements and observations. Electronically signed by Harold Barban MD on 08/01/2019 at 4:08:15 PM.    Final    Vas [place] Lower Extremity Venous (dvt)  Result Date: 07/28/2019  Lower Venous Study Indications: Edema, abcess right proximal calf, and Pain.  Comparison Study: 07/11/19 RLE negative Performing Technologist: June Leap RDMS, RVT  Examination Guidelines: A complete evaluation includes B-mode imaging, spectral Doppler, color Doppler, and power Doppler  as needed of all accessible portions of each vessel. Bilateral testing is considered an integral part of a complete examination. Limited examinations for reoccurring indications may be performed as noted.  +---------+---------------+---------+-----------+----------+--------------+ RIGHT    CompressibilityPhasicitySpontaneityPropertiesThrombus Aging +---------+---------------+---------+-----------+----------+--------------+ CFV      Full           Yes      Yes                                 +---------+---------------+---------+-----------+----------+--------------+ SFJ      Full                                                        +---------+---------------+---------+-----------+----------+--------------+  FV Prox  Full                                                        +---------+---------------+---------+-----------+----------+--------------+ FV Mid   Full                                                        +---------+---------------+---------+-----------+----------+--------------+ FV DistalFull                                                        +---------+---------------+---------+-----------+----------+--------------+ PFV      Full                                                        +---------+---------------+---------+-----------+----------+--------------+ POP                                                   Not visualized +---------+---------------+---------+-----------+----------+--------------+ PTV      None                                         Acute          +---------+---------------+---------+-----------+----------+--------------+ PERO     Full                                                        +---------+---------------+---------+-----------+----------+--------------+   +----+---------------+---------+-----------+----------+--------------+ LEFTCompressibilityPhasicitySpontaneityProperties                +----+---------------+---------+-----------+----------+--------------+ CFV                                              Not visualized +----+---------------+---------+-----------+----------+--------------+     Summary: Right: Findings consistent with acute deep vein thrombosis involving the right posterior tibial veins.  *See table(s) above for measurements and observations. Electronically signed by Curt Jews MD on 07/28/2019 at 4:04:40 AM.    Final       Subjective: "It's better today. Much better."  Discharge Exam: Vitals:   08/15/19 2119 08/16/19 0507  BP: 117/72 122/73  Pulse: 96 93  Resp: 16 17  Temp: 98.8 F (37.1 C) 98.3 F (36.8 C)  SpO2: 92% 97%   Vitals:   08/15/19 0439 08/15/19 1257 08/15/19 2119 08/16/19 0507  BP: 129/72 124/72 117/72 122/73  Pulse: 97 98 96 93  Resp: _0 Temp: 98.5 F (36.9 C) 98.3 F (36.8 C) 98.8 F (37.1 C) 98.3 F (36.8 C)  TempSrc: Oral Oral Oral Oral  SpO2: 97% 97% 92% 97%  Weight:      Height:        General: 48 y.o. male resting in bed in NAD, in good spirits Cardiovascular: RRR, +S1, S2, no m/g/r, equal pulses throughout Respiratory: CTABL, no w/r/r, normal WOB GI: BS+, NDNT, no masses noted, no organomegaly noted MSK: No e/c/c; RLE bandaging CDI Skin: No rashes, bruises, ulcerations noted Neuro: Alert to name, follows commands Psyc: Appropriate interaction and affect, calm/cooperative    The results of significant diagnostics from this hospitalization (including imaging, microbiology, ancillary and laboratory) are listed below for reference.     Microbiology: Recent Results (from the past 240 hour(s))  Surgical pcr screen     Status: None   Collection Time: 08/08/19  9:22 AM   Specimen: Nasal Mucosa; Nasal Swab  Result Value Ref Range Status   MRSA, PCR NEGATIVE NEGATIVE Final   Staphylococcus aureus NEGATIVE NEGATIVE Final    Comment: (NOTE) The Xpert SA Assay (FDA approved for NASAL specimens in  patients 81 years of age and older), is one component of a comprehensive surveillance program. It is not intended to diagnose infection nor to guide or monitor treatment. Performed at [facility], [place] [address] [place], [place] [address]      Labs: BNP (last 3 results) No results for input(s): BNP in the last 8760 hours. Basic Metabolic Panel: Recent Labs  Lab 08/09/19 1850 08/10/19 0440 08/13/19 2027 08/14/19 0327 08/16/19 0441  NA 133* 135 136 137 136  K 3.6 3.8 3.8 3.9 3.9  CL 96* 98 99 97* 98  CO2 _1 GLUCOSE 162* 159* 152* 115* 125*  BUN _2 CREATININE 0.56* 0.53* 0.67 0.53* 0.58*  CALCIUM 8.4* 8.6* 8.5* 8.8* 8.9  MG  --   --   --   --  1.9  PHOS  --   --   --   --  4.8*   Liver Function Tests: Recent Labs  Lab 08/16/19 0441  ALBUMIN 2.2*   No results for input(s): LIPASE, AMYLASE in the last 168 hours. No results for input(s): AMMONIA in the last 168 hours. CBC: Recent Labs  Lab 08/10/19 0440 08/11/19 1654 08/13/19 2027 08/14/19 0327 08/16/19 0441  WBC 10.0 6.9 5.5 5.2 5.8  NEUTROABS  --   --   --   --  2.6  HGB 7.5* 7.7* 7.6* 7.6* 7.8*  HCT 25.8* 25.2* 26.1* 25.2* 26.1*  MCV 100.4* 97.3 98.1 98.1 97.0  PLT 252 237 240 219 222   Cardiac Enzymes: No results for input(s): CKTOTAL, CKMB, CKMBINDEX, TROPONINI in the last 168 hours. BNP: Invalid input(s): POCBNP CBG: Recent Labs  Lab 08/15/19 1209 08/15/19 1714 08/15/19 2110 08/16/19 0732 08/16/19 1142  GLUCAP 129* 105* 161* 150* 141*   D-Dimer No results for input(s): DDIMER in the last 72 hours. Hgb A1c No results for input(s): HGBA1C in the last 72 hours. Lipid Profile No results for input(s): CHOL, HDL, LDLCALC, TRIG, CHOLHDL, LDLDIRECT in the last 72 hours. Thyroid function studies No results for input(s): TSH, T4TOTAL, T3FREE, THYROIDAB in the last 72 hours.  Invalid input(s): FREET3 Anemia work up No results for input(s): VITAMINB12, FOLATE, FERRITIN,  TIBC, IRON, RETICCTPCT in the last 72 hours. Urinalysis    Component  Value Date/Time   COLORURINE YELLOW 07/06/2019 [place] 07/06/2019 0437   LABSPEC 1.024 07/06/2019 0437   PHURINE 5.0 07/06/2019 0437   GLUCOSEU >=500 (A) 07/06/2019 0437   HGBUR MODERATE (A) 07/06/2019 0437   BILIRUBINUR NEGATIVE 07/06/2019 0437   KETONESUR 80 (A) 07/06/2019 0437   PROTEINUR NEGATIVE 07/06/2019 0437   NITRITE NEGATIVE 07/06/2019 0437   LEUKOCYTESUR NEGATIVE 07/06/2019 0437   Sepsis Labs Invalid input(s): PROCALCITONIN,  WBC,  LACTICIDVEN Microbiology Recent Results (from the past 240 hour(s))  Surgical pcr screen     Status: None   Collection Time: 08/08/19  9:22 AM   Specimen: Nasal Mucosa; Nasal Swab  Result Value Ref Range Status   MRSA, PCR NEGATIVE NEGATIVE Final   Staphylococcus aureus NEGATIVE NEGATIVE Final    Comment: (NOTE) The Xpert SA Assay (FDA approved for NASAL specimens in patients 8 years of age and older), is one component of a comprehensive surveillance program. It is not intended to diagnose infection nor to guide or monitor treatment. Performed at [facility], [place] [address] [place], [place] [address]      Time coordinating discharge: 35 minutes  SIGNED:   Jonnie Finner, DO  [facility] 08/16/2019, 12:57 PM Pager   If 7PM-7AM, please contact night-coverage [email or web address] Password [email or web address]

## 2019-08-16 NOTE — PMR Pre-admission (Signed)
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 He is aware that [facility] follow up and IV antibiotics are private pay.  Medicaid Application Date:       Case Manager:  Disability Application Date:       Case Worker:   The "Data Collection Information Summary" for patients in Inpatient Rehabilitation Facilities with attached "Privacy Act [place] Records" was provided and verbally reviewed with: N/A  Emergency Contact Information Contact Information    Name Relation Home Work Mobile   SIMMERS,DEBIE Mother   [phone]   Grace, Ambrogio   [phone]   Gabe, Gensemer Father   [phone]     Current Medical History  Patient Admitting Diagnosis: Debility  History of Present Illness: 48 year old right-handed male with history of hypertension, diabetes mellitus, morbid obesity, OSA.   Presented 07/06/2019 with complaints of right flank pain x3 weeks and recently seen in urgent care.  Patient did recently receive a shot of Toradol to the right buttocks as well as steroid Dosepak.  Patient reports low back pain radiating to the right gluteus/thigh area with progressive decline in right lower extremity strength and difficulty in ambulation.  Patient noted to have low-grade fever as well as tachycardia, tachypneic.  Blood glucose was elevated with increased anion gap.  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  Placed on broad-spectrum antibiotics.   Neurosurgery consulted did not feel needed surgical intervention and exam was consistent with peripheral nerve and lumbar plexus involvement.  Underwent IR guided paraspinal abscess drainage per Dr. Doreatha Martin and blood culture completed showing MSSA.  Infectious disease consulted advised antibiotic therapy x6 to 8 weeks with Ancef through 08/27/2019.  Echocardiogram and TEE completed showing ejection fraction [id number] normal systolic function no vegetation noted.  Patient underwent I&D of large gluteal abscess and neuroplasty of sciatic nerve 07/09/2019 per Dr. Doreatha Martin followed by multiple right lower extremity I&D's and wound VAC placement with changes and later with I&D with closure removal of wound VAC 08/08/2019 and Hemovac drain placed that was later removed 08/10/2019 with persistent serosanguineous drainage from wound compressive wrap and dressing placed.  Follow-up CT scan of the hip performed showing no abscess felt drainage most likely related to size of initial abscess .  Hospital course complicated by right posterior tibial vein DVT 08/01/2019.  CT angiogram of chest negative for pulmonary emboli and patient was started on Xarelto.  Hospital course anemia patient has been transfused 2 units of packed red blood cells during hospital course latest hemoglobin 7.8.    Past Medical History  Past Medical History:  Diagnosis Date  . Arthritis   . Asthma   . Diabetes mellitus, type 2 ([place])   . Hypertension   . Morbid obesity ([place])   . OSA (obstructive sleep apnea)  Family History  family history includes Diabetes in his paternal grandmother.  Prior Rehab/Hospitalizations:  Has the patient had prior rehab or hospitalizations prior to admission? Yes  Has the patient had major surgery during 100 days prior to admission? Yes  Current Medications   Current Facility-Administered Medications:  .  acetaminophen (TYLENOL) tablet 1,000 mg, 1,000 mg, Oral, TID, Patrecia Pace A, PA-C, 1,000 mg at 08/16/19  1037 .  albuterol (PROVENTIL) (2.5 MG/3ML) 0.083% nebulizer solution 2.5 mg, 2.5 mg, Nebulization, Q6H PRN, Ricci Barker, Sarah A, PA-C .  ceFAZolin (ANCEF) IVPB 2g/100 mL premix, 2 g, Intravenous, Q8H, Yacobi, Sarah A, PA-C, Last Rate: 200 mL/hr at 08/16/19 1041, 2 g at 08/16/19 1041 .  Chlorhexidine Gluconate Cloth 2 % PADS 6 each, 6 each, Topical, Daily, Delray Alt, PA-C, 6 each at 08/16/19 1037 .  diphenhydrAMINE (BENADRYL) injection 25 mg, 25 mg, Intravenous, Once PRN, Delray Alt, PA-C .  gabapentin (NEURONTIN) capsule 300 mg, 300 mg, Oral, TID, Patrecia Pace A, PA-C, 300 mg at 08/16/19 1037 .  hydrALAZINE (APRESOLINE) injection 10 mg, 10 mg, Intravenous, Q6H PRN, Patrecia Pace A, PA-C .  hydrALAZINE (APRESOLINE) tablet 25 mg, 25 mg, Oral, BID, Patrecia Pace A, PA-C, 25 mg at 08/16/19 1037 .  influenza vac split quadrivalent PF (FLUARIX) injection 0.5 mL, 0.5 mL, Intramuscular, Tomorrow-1000, Yacobi, Sarah A, PA-C .  insulin aspart (novoLOG) injection 0-15 Units, 0-15 Units, Subcutaneous, TID WC, Delray Alt, PA-C, 2 Units at 08/16/19 [phone] .  insulin aspart (novoLOG) injection 0-5 Units, 0-5 Units, Subcutaneous, QHS, Delray Alt, PA-C, 2 Units at 08/08/19 2243 .  insulin aspart (novoLOG) injection 8 Units, 8 Units, Subcutaneous, TID WC, Delray Alt, PA-C, 8 Units at 08/16/19 [id number] .  insulin NPH Human (NOVOLIN N) injection 28 Units, 28 Units, Subcutaneous, BID AC & HS, Delray Alt, PA-C, 28 Units at 08/16/19 [id number] .  iron polysaccharides (NIFEREX) capsule 150 mg, 150 mg, Oral, Daily, Patrecia Pace A, PA-C, 150 mg at 08/16/19 1037 .  methocarbamol (ROBAXIN) tablet 750 mg, 750 mg, Oral, TID, Patrecia Pace A, PA-C, 750 mg at 08/16/19 1037 .  metoCLOPramide (REGLAN) 5 MG/5ML solution 10 mg, 10 mg, Oral, TID AC & HS, Delray Alt, PA-C, 10 mg at 08/16/19 [id number] .  multivitamin with minerals tablet 1 tablet, 1 tablet, Oral, Daily, Delray Alt, PA-C, 1 tablet at 08/16/19 1037 .   ondansetron (ZOFRAN) tablet 4 mg, 4 mg, Oral, Q6H PRN **OR** ondansetron (ZOFRAN) injection 4 mg, 4 mg, Intravenous, Q6H PRN, Delray Alt, PA-C, 4 mg at 07/11/19 2034 .  oxyCODONE (Oxy IR/ROXICODONE) immediate release tablet 10 mg, 10 mg, Oral, Q4H PRN, Delray Alt, PA-C, 10 mg at 08/16/19 0514 .  oxyCODONE (OXYCONTIN) 12 hr tablet 40 mg, 40 mg, Oral, Q12H, Kyle, Tyrone A, DO, 40 mg at 08/16/19 1037 .  polyethylene glycol (MIRALAX / GLYCOLAX) packet 17 g, 17 g, Oral, BID, Patrecia Pace A, PA-C, 17 g at 08/16/19 1037 .  protein supplement (ENSURE MAX) liquid, 11 oz, Oral, TID BM, Patrecia Pace A, PA-C, 11 oz at 08/16/19 1037 .  Rivaroxaban (XARELTO) tablet 15 mg, 15 mg, Oral, BID, 15 mg at 08/16/19 1038 **FOLLOWED BY** [START ON 08/25/2019] rivaroxaban (XARELTO) tablet 20 mg, 20 mg, Oral, Q supper, Joselyn Glassman A, RPH .  senna-docusate (Senokot-S) tablet 1 tablet, 1 tablet, Oral, BID, Delray Alt, PA-C, 1 tablet at 08/16/19 1037 .  sodium chloride flush (NS) 0.9 % injection 10-40 mL,  10-40 mL, Intracatheter, Q12H, Delray Alt, PA-C, 10 mL at 08/15/19 2110 .  sodium chloride flush (NS) 0.9 % injection 10-40 mL, 10-40 mL, Intracatheter, PRN, Patrecia Pace A, PA-C, 10 mL at 08/08/19 1649 .  zolpidem (AMBIEN) tablet 5 mg, 5 mg, Oral, QHS PRN, Delray Alt, PA-C, 5 mg at 08/15/19 2313  Patients Current Diet:  Diet Order            Diet Carb Modified Fluid consistency: Thin; Room service appropriate? Yes  Diet effective now              Precautions / Restrictions Precautions Precautions: Fall Precaution Comments: very sensitive/wound present R posterior thigh, check BPs Restrictions Weight Bearing Restrictions: Yes RLE Weight Bearing: Weight bearing as tolerated   Has the patient had 2 or more falls or a fall with injury in the past year? 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  Self Care: Did the patient need help bathing, dressing, using the toilet or eating?  Independent  Indoor Mobility: Did the patient need assistance with walking from room to room (with or without device)? Independent  Stairs: Did the patient need assistance with internal or external stairs (with or without device)? Independent  Functional Cognition: Did the patient need help planning regular tasks such as shopping or remembering to take medications? Independent  Home Assistive Devices / Equipment Home Assistive Devices/Equipment: [place] Rile (specify type) Home Equipment: None  Prior Device Use: Indicate devices/aids used by the patient prior to current illness, exacerbation or injury? None of the above  Current Functional Level Cognition  Overall Cognitive Status: Within Functional Limits for tasks assessed Orientation Level: Oriented X4 General Comments: Pt cognition within normal limits, pt is cooperative and pleasant through session.    Extremity Assessment (includes Sensation/Coordination)  Upper Extremity Assessment: Overall WFL for tasks assessed  Lower Extremity Assessment: Defer to PT evaluation RLE Deficits / Details: limited due to pain, less so that at initial eval; able to move LE along sheet RLE: Unable to fully assess due to pain    ADLs  Overall ADL's : Needs assistance/impaired Eating/Feeding: Set up, Bed level Grooming: Oral care, Standing, Min guard Upper Body Bathing: Set up, Bed level Lower Body Bathing: Moderate assistance, Bed level Upper Body Dressing : Set up, Sitting Lower Body Dressing: Minimal assistance, Sitting/lateral leans, With adaptive equipment Lower Body Dressing Details (indicate cue type and reason): instructed in use of wide sock aide and reacher Toilet Transfer: Minimal assistance, RW, Ambulation Toilet Transfer Details (indicate cue type and reason): unable to transfer d/t syncope episode at  EOB Toileting- Clothing Manipulation and Hygiene: Maximal assistance Functional mobility during ADLs: Min guard, Rolling walker General ADL Comments: pt with one loss of balance while standing at EOB, did not require assistance to regain balance    Mobility  Overal bed mobility: Independent Bed Mobility: Rolling, Supine to Sit, Sit to Supine Rolling: Modified independent (Device/Increase time)(pt uses bed rails and elevated HOB) Sidelying to sit: Min assist, +2 for physical assistance, HOB elevated Supine to sit: Modified independent (Device/Increase time), Supervision Sit to supine: Min assist(minA for management of foley and minimal physical assist for RLE) Sit to sidelying: Mod assist, +2 for physical assistance General bed mobility comments: Pt able to complete transfers without assist, benefits from supervision for line management    Transfers  Overall transfer level: Needs assistance Equipment used: Rolling walker (  2 wheeled) Transfers: Sit to/from Stand Sit to Stand: From elevated surface, Min guard Stand pivot transfers: Min guard General transfer comment: Pt demos improved ability to complete transfers without assist, guarding for safety.    Ambulation / Gait / Stairs / Wheelchair Mobility  Ambulation/Gait Ambulation/Gait assistance: Min guard, +2 safety/equipment Gait Distance (Feet): 30 Feet Assistive device: Rolling walker (2 wheeled) Gait Pattern/deviations: Step-through pattern, Decreased stride length, Trunk flexed, Decreased step length - left, Decreased stance time - right, Decreased weight shift to right, Wide base of support, Antalgic General Gait Details: pain R LE with movement, min guard x2 for safety and equipment management, fatigued at end of gait distance Gait velocity: decreased    Posture / Balance Dynamic Sitting Balance Sitting balance - Comments: able to sit without UE assist at EOB Balance Overall balance assessment: Needs assistance Sitting-balance  support: No upper extremity supported, Feet supported Sitting balance-Leahy Scale: Normal Sitting balance - Comments: able to sit without UE assist at EOB Postural control: Left lateral lean Standing balance support: Bilateral upper extremity supported Standing balance-Leahy Scale: Fair Standing balance comment: Pt able to maintain standing with BUE support without LOB, however, after 3 min of prolonged standing pt reports onset of shakiness, weakness, and generally became unstable requiring seated rest break    Special needs/care consideration BiPAP/CPAP n/a CPM n/a Continuous Drip IV PICC placed 0000000 right basilic single lumen. Ancef with LOT 10/26. [facility] with [facility] for med and HHRN for PICC with [facility] n/a Life Vest n/a Oxygen n/a Special Bed n/a Trach Size n/a Wound Vac n/a Skin right hip, right leg and right knee with surgical incision with serosanguineous drainage controlled with hip spica compression wraps ; right lower right surgical incision with dressing Bowel mgmt: continent LBM 10/13 continent Bladder mgmt: external catheter Diabetic mgm Hgb A1c 12.6, new to insulin this admission. Diabetes Coordinator has been working with patient on acute hospital. Will need prescription for glucose meter, supplies and syringes at d/c Behavioral consideration patient anxious Chemo/radiation  N/a Designated visitor Mom, Debbie D/c to Dad's home eat [place]. Jori Moll F/u OP appointment already arranged with [facility] for 10/30 at 0930 by RN CM on Acute hospital [facility] with [facility] for antibiotic and [facility] for IV PICC care per RN CM acute hospital    Previous Home Environment  Living Arrangements: (lives with his 64 year old son)  Lives With: Family Available Help at Discharge: Family, Available 24 hours/day(to fo stay with his Dad in [place]) Type of Home: [place]: One level Home  Access: Stairs to enter Entrance Stairs-Rails: Right Entrance Stairs-Number of Steps: 3-4 Bathroom Shower/Tub: Multimedia programmer: Standard Bathroom Accessibility: Yes How Accessible: Accessible via walker [place]: No  Discharge Living Setting Plans for Discharge Living Setting: Lives with (comment)(to saty with his Dad in [place]) Type of Home at Discharge: House Discharge Home Layout: One level Discharge Home Access: Level entry Discharge Bathroom Shower/Tub: Tub/shower unit Discharge Bathroom Toilet: Standard Discharge Bathroom Accessibility: Yes How Accessible: Accessible via walker Does the patient have any problems obtaining your medications?: Yes (Describe)(uninsured, plans to get meds at [facility] at d/c)  Social/Family/Support Systems Patient Roles: 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, trucker self employed) Contact Information: Mom Debbie Anticipated Caregiver: Dad, 15 year old and friend who is retired Restaurant manager, fast food Information: Debbie and Nye Ability/Limitations of Caregiver: Dad 12 years old; friend is retired Therapist, sports  to asisst with IV home meds Caregiver Availability: 24/7 Discharge Plan Discussed with Primary Caregiver: Yes Is Caregiver In Agreement with Plan?: Yes Does Caregiver/Family have Issues with Lodging/Transportation while Pt is in Rehab?: No  Goals/Additional Needs Patient/Family Goal for Rehab: supervision PT and OT Expected length of stay: ELOS 5 to 7 days Special Service Needs: IV antibiotics until 10/26. Has PICC Pt/Family Agrees to Admission and willing to participate: Yes Program Orientation Provided & Reviewed with Pt/Caregiver Including Roles  & Responsibilities: Yes  Decrease burden of Care through IP rehab admission: n/a  Possible need for SNF placement upon discharge:not anticipated  Patient Condition: This patient's medical and functional status has changed since the consult dated: 07/11/2019 in  which the Rehabilitation Physician determined and documented that the patient's condition is appropriate for intensive rehabilitative care in an inpatient rehabilitation facility. See "History of Present Illness" (above) for medical update. Functional changes are: overall min guard assist very short distance. Patient's medical and functional status update has been discussed with the Rehabilitation physician and patient remains appropriate for inpatient rehabilitation. Will admit to inpatient rehab today.  Preadmission Screen Completed By:  Cleatrice Burke, RN, 08/16/2019 11:02 AM ______________________________________________________________________   Discussed status with Dr. Naaman Plummer on 08/16/2019 at 1118 and received approval for admission today.  Admission Coordinator:  Cleatrice Burke, time [id number] date 08/16/2019

## 2019-08-16 NOTE — Progress Notes (Signed)
Ortho Trauma Note  Continues to drain serosang fluid from hip and leg wound. pain better controlled. Patient notes amount of drainage decreases each day which is encouraging. States he would like drainage to slow down some more before he feels comfortable going to inpatient rehab, he "doesn't want to have to be dealing with drainage while he is trying to focus on regaining his strength." Spoke with CIR coordinator this morning, she plan to offer patient a bed today. If he declines, she plan to sign off with no plan for re-consult.   RLE swelling much improved but still with serosang drainage. Motor and sensory function intact. Neurovascularly intact.   A/P continue with compressive wrap and dressing change PRN  Unfortunately he continues to drain. CT scan of hip performed shows no abscess. Drainage likely secondary to size of initial abscess, size of patient and anticoagulation. Possible discharge to CIR today, will continue to follow.  [facility] ?([phone]? (phone)

## 2019-08-16 NOTE — Progress Notes (Signed)
To rehab unit per bed alert and oriented patient. Oriented to unit set up. Fall precaution discussed and signed by patient.

## 2019-08-16 NOTE — Progress Notes (Signed)
Inpatient Rehabilitation Admissions Coordinator  I met with patient at bedside to offer a CIR bed to admit to today. Patient states [facility] PA just said to wait until Monday. I let him know that I had discussed with PA as she just left his bedside, as well as Dr. Doreatha Martin that they confirm patient is ready from surgical point to admit to CIR today. I contacted Dr. Marylyn Ishihara and he states pt ready for d/c today. I will make the arrangements to admit to CIR today.  Danne Baxter, RN, MSN Rehab Admissions Coordinator [phone] 08/16/2019 10:36 AM

## 2019-08-16 NOTE — Progress Notes (Signed)
Report called and given to Marjorie Smolder, RN in [place]. All questions answered to satisfaction. Pt to be transported via bed by RN to [id number].

## 2019-08-16 NOTE — Plan of Care (Signed)
  Problem: RH BLADDER ELIMINATION Goal: RH STG MANAGE BLADDER WITH ASSISTANCE Description: STG Manage Bladder With Assistance Outcome: Not Progressing; requesting condom cath for now

## 2019-08-17 ENCOUNTER — Other Ambulatory Visit: Payer: Self-pay

## 2019-08-17 ENCOUNTER — Inpatient Hospital Stay (HOSPITAL_COMMUNITY): Payer: Self-pay | Admitting: Occupational Therapy

## 2019-08-17 ENCOUNTER — Inpatient Hospital Stay (HOSPITAL_COMMUNITY): Payer: Self-pay | Admitting: Physical Therapy

## 2019-08-17 LAB — CBC WITH DIFFERENTIAL/PLATELET
Abs Immature Granulocytes: 0.04 10*3/uL (ref 0.00–0.07)
Basophils Absolute: 0 10*3/uL (ref 0.0–0.1)
Basophils Relative: 1 %
Eosinophils Absolute: 0.3 10*3/uL (ref 0.0–0.5)
Eosinophils Relative: 3 %
HCT: 30.6 % — ABNORMAL LOW (ref 39.0–52.0)
Hemoglobin: 9.1 g/dL — ABNORMAL LOW (ref 13.0–17.0)
Immature Granulocytes: 1 %
Lymphocytes Relative: 33 %
Lymphs Abs: 2.9 10*3/uL (ref 0.7–4.0)
MCH: 28.7 pg (ref 26.0–34.0)
MCHC: 29.7 g/dL — ABNORMAL LOW (ref 30.0–36.0)
MCV: 96.5 fL (ref 80.0–100.0)
Monocytes Absolute: 0.8 10*3/uL (ref 0.1–1.0)
Monocytes Relative: 9 %
Neutro Abs: 4.6 10*3/uL (ref 1.7–7.7)
Neutrophils Relative %: 53 %
Platelets: 277 10*3/uL (ref 150–400)
RBC: 3.17 MIL/uL — ABNORMAL LOW (ref 4.22–5.81)
RDW: 15 % (ref 11.5–15.5)
WBC: 8.6 10*3/uL (ref 4.0–10.5)
nRBC: 0.2 % (ref 0.0–0.2)

## 2019-08-17 LAB — COMPREHENSIVE METABOLIC PANEL
ALT: 11 U/L (ref 0–44)
AST: 15 U/L (ref 15–41)
Albumin: 2.7 g/dL — ABNORMAL LOW (ref 3.5–5.0)
Alkaline Phosphatase: 74 U/L (ref 38–126)
Anion gap: 11 (ref 5–15)
BUN: 9 mg/dL (ref 6–20)
CO2: 27 mmol/L (ref 22–32)
Calcium: 9.1 mg/dL (ref 8.9–10.3)
Chloride: 94 mmol/L — ABNORMAL LOW (ref 98–111)
Creatinine, Ser: 0.6 mg/dL — ABNORMAL LOW (ref 0.61–1.24)
GFR calc Af Amer: >60 mL/min (ref >60)
GFR calc non Af Amer: >60 mL/min (ref >60)
Glucose, Bld: 109 mg/dL — ABNORMAL HIGH (ref 70–99)
Potassium: 3.9 mmol/L (ref 3.5–5.1)
Sodium: 132 mmol/L — ABNORMAL LOW (ref 135–145)
Total Bilirubin: 0.3 mg/dL (ref 0.3–1.2)
Total Protein: 7.1 g/dL (ref 6.5–8.1)

## 2019-08-17 LAB — GLUCOSE, CAPILLARY
Glucose-Capillary: 118 mg/dL — ABNORMAL HIGH (ref 70–99)
Glucose-Capillary: 105 mg/dL — ABNORMAL HIGH (ref 70–99)
Glucose-Capillary: 122 mg/dL — ABNORMAL HIGH (ref 70–99)
Glucose-Capillary: 135 mg/dL — ABNORMAL HIGH (ref 70–99)

## 2019-08-17 MED ORDER — SODIUM CHLORIDE 0.9% FLUSH
10.0000 mL | INTRAVENOUS | Status: DC | PRN
  Administered 2019-08-21 – 2019-08-26 (×4): 10 mL
  Filled 2019-08-17 (×4): qty 40

## 2019-08-17 MED ORDER — CHLORHEXIDINE GLUCONATE CLOTH 2 % EX PADS
6.0000 | MEDICATED_PAD | Freq: Two times a day (BID) | CUTANEOUS | Status: DC
  Administered 2019-08-17 – 2019-08-28 (×20): 6 via TOPICAL

## 2019-08-17 NOTE — Progress Notes (Signed)
Social Work Assessment and Plan   Patient Details  Name: Bradley Cox MRN: [id number] Date of Birth: 10-Mar-1971  Today's Date: 08/17/2019  Problem List:  Patient Active Problem List   Diagnosis Date Noted  . Acute DVT (deep venous thrombosis) ([place]) 07/29/2019  . MSSA bacteremia 07/10/2019  . Abscess of right hip 07/09/2019  . Paraspinal abscess ([place]) 07/09/2019  . 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 Sepsis ([place]) 07/06/2019  . AKI (acute kidney injury) ([place]) 07/06/2019  . Hyperkalemia 07/06/2019  . Obesity, Class III, BMI 40-49.9 (morbid obesity) ([place]) 01/21/2009  . GERD 01/21/2009  . DYSPHAGIA 01/21/2009  . Essential hypertension 01/07/2009  . ASTHMA 01/07/2009   Past Medical History:  Past Medical History:  Diagnosis Date  . Arthritis   . Asthma   . Diabetes mellitus, type 2 ([place])   . Hypertension   . Morbid obesity ([place])   . OSA (obstructive sleep apnea)    Past Surgical History:  Past Surgical History:  Procedure Laterality Date  . APPLICATION OF WOUND VAC Right 07/17/2019   Procedure: Application Of Wound Vac;  Surgeon: Altamese Archuleta, MD;  Location: [facility];  Service: Orthopedics;  Laterality: Right;  . APPLICATION OF WOUND VAC Right 07/20/2019   Procedure: Application Of Wound Vac;  Surgeon: Altamese Vidalia, MD;  Location: [facility];  Service: Orthopedics;  Laterality: Right;  . APPLICATION OF WOUND VAC Right 07/27/2019   Procedure: Application Of Wound Vac;  Surgeon: Shona Needles, MD;  Location: [facility];  Service: Orthopedics;  Laterality: Right;  . HERNIA REPAIR     As a baby  . I&D EXTREMITY Right 07/27/2019   Procedure: IRRIGATION AND DEBRIDEMENT EXTREMITY Right Leg;  Surgeon: Shona Needles, MD;  Location: [facility];  Service: Orthopedics;  Laterality: Right;  . I&D EXTREMITY Right 07/30/2019   Procedure: IRRIGATION AND DEBRIDEMENT EXTREMITY WITH WOUND VAC CHANGE;  Surgeon: Shona Needles, MD;  Location: [facility];  Service: Orthopedics;  Laterality: Right;   IRRIGATION AND DEBRIDEMENT EXTREMITY WITH WOUND VAC CHANGE  . I&D EXTREMITY Right 08/08/2019   Procedure: IRRIGATION AND DEBRIDEMENT RIGHT EXTREMITY;  Surgeon: Shona Needles, MD;  Location: [facility];  Service: Orthopedics;  Laterality: Right;  . INCISION AND DRAINAGE ABSCESS Right 07/09/2019   Procedure: INCISION AND DRAINAGE ABSCESS RIGHT HIP/PELVIS;  Surgeon: Shona Needles, MD;  Location: [facility];  Service: Orthopedics;  Laterality: Right;  . INCISION AND DRAINAGE HIP Right 07/17/2019   Procedure: IRRIGATION AND DEBRIDEMENT HIP;  Surgeon: Altamese [place], MD;  Location: [facility] [place] [facility];  Service: Orthopedics;  Laterality: Right;  . INCISION AND DRAINAGE HIP Right 07/20/2019   Procedure: Irrigation And Debridement Hip;  Surgeon: Altamese , MD;  Location: [facility];  Service: Orthopedics;  Laterality: Right;  . TEE WITHOUT CARDIOVERSION N/A 07/11/2019   Procedure: TRANSESOPHAGEAL ECHOCARDIOGRAM (TEE);  Surgeon: Sanda Klein, MD;  [facility] ENDOSCOPY;  Service: Cardiovascular;  Laterality: N/A;   Social History:  reports that he has never smoked. His smokeless tobacco use includes snuff. He reports current alcohol use. He reports that he does not use drugs.  Family / Support Systems Marital Status: Divorced How Long?: 11 yrs Patient Roles: Parent Children: son, Jaycek, Zubair [id number]) @ [phone] (living with pt);  daughter, Thea Silversmith [facility]) Other Supports: father, Samrudh Borgo @ [phone] and mother, Jearl Klinefelter @ [phone] - both parents are livingin [place] Anticipated Caregiver: Dad, 16 year old and friend who is retired Therapist, sports Ability/Limitations of Caregiver: Dad 61 years old;  friend is retired Therapist, sports to [employer] with IV home meds Caregiver Availability: 24/7 Family Dynamics: Pt notes that each parent and his adult children are all supportive.  He is planning to stay with father simply because he has 4 large dogs in his own home.  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   Abuse/Neglect Abuse/Neglect Assessment Can Be Completed: Yes Physical Abuse: Denies Verbal Abuse: Denies Sexual Abuse: Denies Exploitation of patient/patient's resources: Denies Self-Neglect: Denies  Emotional Status Pt's affect, behavior and adjustment status: Pt very pleasant, talkative and completes assessment interview without any difficulty.  Denies any significant emotional distress, however, very concerned about his financial picture moving forward and feeling job insecurity.  Will monitor mood and refer for neuropsychology if indicated. Recent Psychosocial Issues: Pt notes he has been dealing with these medical issues since August and has been unable to work. Psychiatric History: None Substance Abuse History: None  Patient / Family Perceptions, Expectations & Goals Pt/Family understanding of illness & functional limitations: Pt able to provide detailed report of his medical course since August.  Has a good understanding of the medical issues and of his current functional limitations/ reason for CIR Premorbid pt/family roles/activities: Pt has been able to remain independent overall but very limited in mobility by pain. Anticipated changes in roles/activities/participation: Father to assume primary support role, however, pt does not wish to place any "burden" on him other than providing pt with easier accessed place to stay. Pt/family expectations/goals: "I just want to be able to walk some and take care of my basic daily needs."  [employer]: None Premorbid Home Care/DME Agencies: None Transportation available at  discharge: yes  Discharge Planning Living Arrangements: Children Support Systems: Parent, Children, Friends/neighbors Type of Residence: Private residence Insurance Resources: Teacher, adult education Resources: Employment, Secondary school teacher Screen Referred: Previously completed Living Expenses: Education officer, community Management: Patient Does the patient have any problems obtaining your medications?: Yes (Describe)(uninsured) Home Management: Pt and son share most responsibilities in the home. Patient/Family Preliminary Plans: As noted, pt to go to father's home initially until he feels safe to be moving around 4 large dogs in his own home. Social Work Anticipated Follow Up Needs: HH/OP Expected length of stay: 12-14 days  Clinical Impression Pleasant gentleman here following extensive procedures and surgeries to treat multiple areas of infection.  Very motivated for therapies and hopeful for short LOS.  Denies any significant emotional distress, however, does have financial concerns as he is uninsured.  Will follow for support and d/c planning needs.  Satya Buttram 08/17/2019, 4:11 PM

## 2019-08-17 NOTE — Progress Notes (Signed)
[place] PHYSICAL MEDICINE & REHABILITATION PROGRESS NOTE   Subjective/Complaints:  Bradley Cox reports he's anxious because concerned/nervous how therapy will go- needs to have a BM- it's the 3rd day since no BM and was given Sorbitol- also concerned will have accident.  Abd is a little tender due to constipation.  Had drainage from [place] yesterday but hasn't seen any today.  Objective:   No results found. Recent Labs    08/16/19 0441 08/17/19 1038  WBC 5.8 8.6  HGB 7.8* 9.1*  HCT 26.1* 30.6*  PLT 222 277   Recent Labs    08/16/19 0441 08/17/19 1038  NA 136 132*  K 3.9 3.9  CL 98 94*  CO2 29 27  GLUCOSE 125* 109*  BUN 12 9  CREATININE 0.58* 0.60*  CALCIUM 8.9 9.1    Intake/Output Summary (Last 24 hours) at 08/17/2019 1334 Last data filed at 08/17/2019 1318 Gross per 24 hour  Intake 480 ml  Output 1000 ml  Net -520 ml     Physical Exam: Vital Signs Blood pressure 126/77, pulse 100, temperature 99.3 F (37.4 C), temperature source Oral, resp. rate 18, SpO2 95 %. Physical Exam Constitutional: awake, alert, appropriate, lying in bed; long beard, appears a little anxious, NAD HENT:  Head:Normocephalicand atraumatic.  Eyes:conjugate gaze  Neck:normal ROM Cardiovascular:Normal rateand regular rhythm.  Respiratory:CTA b/L  [id number]. Slightly distended; a little TTP diffusely; hypoactive BS Musculoskeletal:  General: Edema(RLE)present.  Neurological: He isalertand oriented to person, place, and time. Patient is alert in no acute distress. Follows full commands fair awareness of deficits. UE 5/5. RLE 2-/5 HF, KE and 4/5 ADF/PF. LLE: 4/5 prox to 5/5 distally. No focal sensory findings.  Skin: Dressing on Lateral R hip- C/D/i ACE wrap on RLE C/D/I- no drainage/seepage through Psychiatric:full affect   Assessment/Plan: 1. Functional deficits secondary to debility which is due to sepsis secondary to disseminated MSSA infection leading to  bacteremia, L3-S1 paraspinal abscess, right psoas abscess, right posterior thigh abscess  which require 3+ hours per day of interdisciplinary therapy in a comprehensive inpatient rehab setting.  Physiatrist is providing close team supervision and 24 hour management of active medical problems listed below.  Physiatrist and rehab team continue to assess barriers to discharge/monitor patient progress toward functional and medical goals  Care Tool:  Bathing              Bathing assist       Upper Body Dressing/Undressing Upper body dressing   What is the patient wearing?: Hospital gown only    Upper body assist Assist Level: Moderate Assistance - Patient 50 - 74%    Lower Body Dressing/Undressing Lower body dressing      What is the patient wearing?: Pants     Lower body assist Assist for lower body dressing: Minimal Assistance - Patient > 75%     Toileting Toileting    Toileting assist Assist for toileting: Independent with assistive device Assistive Device Comment: (condom cath )   Transfers Chair/bed transfer  Transfers assist     Chair/bed transfer assist level: Moderate Assistance - Patient 50 - 74%(RW)     Locomotion Ambulation   Ambulation assist      Assist level: Moderate Assistance - Patient 50 - 74% Assistive device: Walker-rolling Max distance: 3ft   Walk 10 feet activity   Assist     Assist level: Moderate Assistance - Patient - 50 - 74% Assistive device: Walker-rolling   Walk 50 feet activity   Assist Walk 50 feet  with 2 turns activity did not occur: Safety/medical concerns(Fatigue, high pain levels)         Walk 150 feet activity   Assist Walk 150 feet activity did not occur: Safety/medical concerns(fatigue, high pain levels)         Walk 10 feet on uneven surface  activity   Assist Walk 10 feet on uneven surfaces activity did not occur: Safety/medical concerns(fatigue, high pain levels)          Wheelchair     Assist Will patient use wheelchair at discharge?: No             Wheelchair 50 feet with 2 turns activity    Assist            Wheelchair 150 feet activity     Assist          Blood pressure 126/77, pulse 100, temperature 99.3 F (37.4 C), temperature source Oral, resp. rate 18, SpO2 95 %.  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 Status post multiple I&D's as well as wound VAC changes. Hemovac drain removed 08/10/2019 -admit to inpatient rehab 2. Antithrombotics: -DVT/anticoagulation:Xarelto for right posterior tibial vein DVT identified on Dopplers 08/01/2019. -antiplatelet therapy: N/A 3. Pain Management:OxyContin 40 mg every 12 hours, Neurontin 300 mg 3 times daily, Robaxin 750 mg 3 times daily, oxycodone as needed 4. Mood:Provide emotional support -antipsychotic agents: N/A 5. Neuropsych: This patientiscapable of making decisions on hisown behalf. 6. Skin/Wound Care:Compressive wrap and dressing changesas needed to thigh/hip wounds. 7. Fluids/Electrolytes/Nutrition:Routine in and outs with follow-up chemistries 8. ID/MSSA bacteremia/disseminated infection. Continue IV Ancef through 08/27/2019  10/16- T of 99.3- WBC normal- monitor- needs labs 2x/week 9. Acute blood loss anemia. Follow-up CBC. Continue iron supplement 10. Diabetes mellitus. Latest hemoglobin A1c 12.6. NovoLog 8 units 3 times daily with meals, insulin NPH 28 units twice daily.  -check CBG's tid ac/hs -adjust regimen as needed.  CBG (last 3)  Recent Labs    08/16/19 2054 08/17/19 0621 08/17/19 1139  GLUCAP 138* 118* 105*   10/16- good control  11. Hypertension. Hydralazine 25 mg twice daily. Monitor with increased  mobility  10/16- well controlled 12. Morbid obesity. BMI 40-49.9. Dietary follow-up 13. Constipation. Laxative assistance  10/16- was given Sorbitol- if doesn't work, needs Mg citrate/enema tomorrow     LOS: 1 days A FACE TO FACE EVALUATION WAS PERFORMED  Tyleah Loh 08/17/2019, 1:34 PM

## 2019-08-17 NOTE — Evaluation (Addendum)
Physical Therapy Assessment and Plan  Patient Details  Name: LAITH ANTONELLI MRN: [id number] Date of Birth: [birth year]  PT Diagnosis: Abnormal posture, Abnormality of gait, Difficulty walking, Edema, Impaired sensation and Muscle weakness Rehab Potential:  Good ELOS:   14 days   Today's Date: 08/17/2019 PT Individual Time: 1000-1100 PT Individual Time Calculation (min): 60 min    Problem List:      Patient Active Problem List   Diagnosis Date Noted  . Acute DVT (deep venous thrombosis) ([place]) 07/29/2019  . MSSA bacteremia 07/10/2019  . Abscess of right hip 07/09/2019  . Paraspinal abscess ([place]) 07/09/2019  . 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 Sepsis ([place]) 07/06/2019  . AKI (acute kidney injury) ([place]) 07/06/2019  . Hyperkalemia 07/06/2019  . Obesity, Class III, BMI 40-49.9 (morbid obesity) ([place]) 01/21/2009  . GERD 01/21/2009  . DYSPHAGIA 01/21/2009  . Essential hypertension 01/07/2009  . ASTHMA 01/07/2009    Past Medical History:      Past Medical History:  Diagnosis Date  . Arthritis   . Asthma   . Diabetes mellitus, type 2 ([place])   . Hypertension   . Morbid obesity ([place])   . OSA (obstructive sleep apnea)    Past Surgical History:       Past Surgical History:  Procedure Laterality Date  . APPLICATION OF WOUND VAC Right 07/17/2019   Procedure: Application Of Wound Vac;  Surgeon: Altamese [place], MD;  Location: [place];  Service: Orthopedics;  Laterality: Right;  . APPLICATION OF WOUND VAC Right 07/20/2019   Procedure: Application Of Wound Vac;  Surgeon: Altamese [place], MD;  Location: [place];  Service: Orthopedics;  Laterality: Right;  . APPLICATION OF WOUND VAC Right 07/27/2019   Procedure: Application Of Wound Vac;  Surgeon: Shona Needles, MD;  Location: [place];  Service: Orthopedics;  Laterality: Right;  . HERNIA REPAIR     As a baby  . I&D EXTREMITY Right 07/27/2019   Procedure: IRRIGATION AND DEBRIDEMENT EXTREMITY Right Leg;   Surgeon: Shona Needles, MD;  Location: [place];  Service: Orthopedics;  Laterality: Right;  . I&D EXTREMITY Right 07/30/2019   Procedure: IRRIGATION AND DEBRIDEMENT EXTREMITY WITH WOUND VAC CHANGE;  Surgeon: Shona Needles, MD;  Location: [facility];  Service: Orthopedics;  Laterality: Right;  IRRIGATION AND DEBRIDEMENT EXTREMITY WITH WOUND VAC CHANGE  . I&D EXTREMITY Right 08/08/2019   Procedure: IRRIGATION AND DEBRIDEMENT RIGHT EXTREMITY;  Surgeon: Shona Needles, MD;  Location: [place];  Service: Orthopedics;  Laterality: Right;  . INCISION AND DRAINAGE ABSCESS Right 07/09/2019   Procedure: INCISION AND DRAINAGE ABSCESS RIGHT HIP/PELVIS;  Surgeon: Shona Needles, MD;  Location: [facility];  Service: Orthopedics;  Laterality: Right;  . INCISION AND DRAINAGE HIP Right 07/17/2019   Procedure: IRRIGATION AND DEBRIDEMENT HIP;  Surgeon: Altamese [place], MD;  Location: [facility];  Service: Orthopedics;  Laterality: Right;  . INCISION AND DRAINAGE HIP Right 07/20/2019   Procedure: Irrigation And Debridement Hip;  Surgeon: Altamese [place], MD;  Location: [place];  Service: Orthopedics;  Laterality: Right;  . TEE WITHOUT CARDIOVERSION N/A 07/11/2019   Procedure: TRANSESOPHAGEAL ECHOCARDIOGRAM (TEE);  Surgeon: Sanda Klein, MD;  Location: [facility] ENDOSCOPY;  Service: Cardiovascular;  Laterality: N/A;    Assessment & Plan Clinical Impression: Patient is a 48 y.o. year old male with history of hypertension, diabetes mellitus, morbid obesity, OSA. Presented 07/06/2019 with complaints of right flank pain x3 weeks and recently seen in urgent care. Patient did recently receive a  shot of Toradol to the right buttocks as well as steroid Dosepak. Patient reports low back pain radiating to the right gluteus/thigh area with progressive decline in right lower extremity strength and difficulty in ambulation. Patient noted to have low-grade fever as well as tachycardia, tachypneic. Blood glucose was elevated with increased anion gap.  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 Placed on broad-spectrum antibiotics. Neurosurgery consulted did not feel needed surgical intervention and exam was consistent with peripheral nerve and lumbar plexus involvement. Underwent IR guided paraspinal abscess drainage per Dr. Doreatha Martin and blood culture completed showing MSSA. Infectious disease consulted advised antibiotic therapy x6 to 8 weeks with Ancef through 08/27/2019. Echocardiogram and TEE completed showing ejection fraction 38% normal systolic function no vegetation noted. Patient underwent I&D of large gluteal abscess and neuroplasty of sciatic nerve 07/09/2019 per Dr. Doreatha Martin followed by multiple right lower extremity I&D's and wound VAC placement with changes and later with I&D with closure removal of wound VAC 08/08/2019 and Hemovac drain placed that was later removed 08/10/2019 with persistent serosanguineous drainage from wound compressive wrap and dressing placed. Follow-up CT scan of the hip performed showing no abscess felt drainage most likely related to size of initial abscess . Hospital course complicated by right posterior tibial vein DVT 08/01/2019. CT angiogram of chest negative for pulmonary emboli and patient was started on Xarelto. Hospital course anemia patient has been transfused 2 units of packed red blood cells during hospital course latest hemoglobin 7.8.   Patient currently requires mod with mobility secondary to muscle weakness and decreased sitting balance, decreased standing balance, decreased postural control and decreased balance strategies.  Prior to hospitalization, patient was independent  with mobility and lived with   in a   home.  Home access is   .  Patient will benefit from skilled PT intervention to maximize safe functional mobility, minimize fall risk and decrease caregiver burden for planned  discharge home with 24 hour supervision.  Anticipate patient will benefit from follow up [place] at discharge.  PT - End of Session Activity Tolerance: Tolerates 30+ min activity with multiple rests Endurance Deficit: Yes Endurance Deficit Description: Fatigues easily, bilateral LE shakiness with increased activity PT Assessment Rehab Potential (ACUTE/IP ONLY): Good PT Barriers to Discharge: Wound Care PT Plan PT Intensity: Minimum of 1-2 x/day ,45 to 90 minutes PT Frequency: 5 out of 7 days PT Duration Estimated Length of Stay: 14 days PT Treatment/Interventions: Ambulation/gait training;Discharge planning;Functional mobility training;Therapeutic Activities;Balance/vestibular training;Disease management/prevention;Neuromuscular re-education;Skin care/wound management;Therapeutic Exercise;Wheelchair propulsion/positioning;DME/adaptive equipment instruction;Pain management;UE/LE Strength taining/ROM;Community reintegration;Patient/family education;Stair training;UE/LE Coordination activities PT Transfers Anticipated Outcome(s): supervision PT Locomotion Anticipated Outcome(s): supervision w/ LRAD PT Recommendation Follow Up Recommendations: Home health PT Patient destination: Home Equipment Recommended: To be determined Equipment Details: Has been using his father's RW  Skilled Therapeutic Intervention Evaluation completed (see details above and below) with education on PT POC and goals and individual treatment initiated with focus on functional mobility, transfers, ambulation, stair navigation, balance/coordination, LE strength, and improved tolerance to activity. Received pt supine in bed, pt agreeable to PT and reported pain 8/10 in R posterior thigh. Pt donned pants supine with min A for catheter and RLE management. Pt rolled to R with supervision with HOB elevated and bedrails and transferred supine<>sit EOB CGA using redrails and momentum to get up. Pt transferred sit<>stand mod A with RW  using momentum to stand upright. Pt transferred bed<>WC with  RW mod A and reported slight dizziness upon standing and demonstrated minor R LOB but was able to self correct. Pt performed car transfer with RW mod A for safety and LE management. PT wrapped RLE with gauze and ace wrap due to leakage from wound. Pt ambulated 5f with RW mod A with WC follow. Pt demonstrated decreased R weight shift, decreased R stance time, decreased LLE foot clearance, and decreased trunk rotation. Concluded session with pt sitting upright in WC, needs within reach, seatbelt alarm on, and safety plan updated.    PT Evaluation Precautions/Restrictions Precautions Precautions: Fall Precaution Comments: Wound on R posterior thigh, sensitive to touch/movement of RLE Restrictions Weight Bearing Restrictions: Yes RLE Weight Bearing: Weight bearing as tolerated Home Living/Prior Functioning Home Living Available Help at Discharge: Family;Available 24 hours/day;Friend(s)(Mother's friend is RTherapist, sportsand will be available 8am-8pm) Type of Home: House Home Access: Stairs to enter ECenter[employer] Energyof Steps: 3 Entrance Stairs-Rails: None Home Layout: One level Bathroom Shower/Tub: WMultimedia programmer Standard Bathroom Accessibility: Yes Additional Comments: Using dad's RW at home  Lives With: Family(lives with son) Prior Function Level of Independence: Independent with basic ADLs;Independent with homemaking with ambulation;Independent with gait;Independent with transfers  Able to Take Stairs?: Yes Driving: Yes Vocation: Full time employment Leisure: Hobbies-yes (Comment)(paintball) 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 Cognition Overall Cognitive Status: Within Functional Limits for tasks assessed Arousal/Alertness: Awake/alert Awareness: Appears intact Problem Solving: Appears intact Safety/Judgment: Impaired (this appears to be his baseline) Comments: Pt relies heavily on momentum to  initaite movement, occasionally unable to judge force of momentum being used Sensation Sensation Light Touch: Appears Intact Proprioception: Appears Intact Additional Comments: Decreased sensation along RLE Coordination Gross Motor Movements are Fluid and Coordinated: No Fine Motor Movements are Fluid and Coordinated: No Coordination and Movement Description: Grossly uncoordinated, relies on momenturm Heel Shin Test: WFL LLE, RLE unable to test due to strength and pain Motor  Motor Motor: Abnormal postural alignment and control Motor - Skilled Clinical Observations: Grossly uncoordinated relies on momentum  Mobility Bed Mobility Bed Mobility: Rolling Right;Rolling Left;Supine to Sit Rolling Right: Supervision/verbal cueing(HOB elevated) Rolling Left: Supervision/Verbal cueing(HOB elevated) Supine to Sit: Contact Guard/Touching assist(HOB elevated and use of bedrails) Transfers Transfers: Sit to SBank of AmericaTransfers Sit to Stand: Moderate Assistance - Patient 50-74%(RW) Stand Pivot Transfers: Moderate Assistance - Patient 50 - 74%(RW) Stand Pivot Transfer Details: Verbal cues for technique;Verbal cues for precautions/safety;Verbal cues for safe use of DME/AE;Verbal cues for sequencing Stand Pivot Transfer Details (indicate cue type and reason): Pt relies on momentum to get up, requires verbal cues for hand placement, RW safety, and foot placement Transfer (Assistive device): Rolling walker Locomotion  Gait Ambulation: Yes Gait Assistance: Moderate Assistance - Patient 50-74% Gait Distance (Feet): 25 Feet Assistive device: Rolling walker Gait Assistance Details: Verbal cues for technique;Verbal cues for precautions/safety;Verbal cues for gait pattern;Verbal cues for safe use of DME/AE;Verbal cues for sequencing Gait Assistance Details: Decreased R weight shift, decreased stance time on RLE, decreased step length on LLE Gait Gait: Yes Gait Pattern: Impaired Gait Pattern:  Decreased weight shift to right;Decreased stride length;Antalgic;Poor foot clearance - left;Decreased step length - left;Decreased stance time - right;Decreased trunk rotation Gait velocity: decreased Stairs / Additional Locomotion Stairs: No(not tested due to safety, fatigue, and high pain levels) Wheelchair Mobility Wheelchair Mobility: Yes Wheelchair Assistance: SChartered loss adjuster Both upper extremities Wheelchair Parts Management: Needs assistance Distance: 183f Trunk/Postural Assessment  Cervical Assessment Cervical Assessment: Within Functional Limits Thoracic Assessment  Thoracic Assessment: Within Functional Limits Lumbar Assessment Lumbar Assessment: Within Functional Limits Postural Control Postural Control: Deficits on evaluation(Minor LOB to L upon standing)  Balance Balance Balance Assessed: Yes Static Sitting Balance Static Sitting - Balance Support: No upper extremity supported Static Sitting - Level of Assistance: 6: Modified independent (Device/Increase time) Dynamic Sitting Balance Dynamic Sitting - Balance Support: No upper extremity supported Dynamic Sitting - Level of Assistance: 5: Stand by assistance Static Standing Balance Static Standing - Balance Support: Bilateral upper extremity supported(RW) Static Standing - Level of Assistance: 5: Stand by assistance (CGA) Dynamic Standing Balance Dynamic Standing - Balance Support: Bilateral upper extremity supported Dynamic Standing - Level of Assistance: 3: Mod assist Extremity Assessment  RLE Assessment RLE Assessment: Exceptions to [facility] General Strength Comments: Not formally tested due to high pain levels. R knee extension 3-/5 LLE Assessment LLE Assessment: Within Functional Limits General Strength Comments: Grossly 4/5   Refer to Care Plan for Long Term Goals  Recommendations for other services: None   Discharge Criteria: Patient will be discharged from PT if  patient refuses treatment 3 consecutive times without medical reason, if treatment goals not met, if there is a change in medical status, if patient makes no progress towards goals or if patient is discharged from hospital.  The above assessment, treatment plan, treatment alternatives and goals were discussed and mutually agreed upon: by patient  Alfonse Alpers PT, DPT   Burnard Bunting, PT, DPT  08/17/2019, 9:35 AM

## 2019-08-17 NOTE — Progress Notes (Signed)
Inpatient Rehabilitation  Patient information reviewed and entered into eRehab system by Antwon Rochin M. Raniya Golembeski, M.A., CCC/SLP, PPS Coordinator.  Information including medical coding, functional ability and quality indicators will be reviewed and updated through discharge.    

## 2019-08-17 NOTE — Evaluation (Signed)
Occupational Therapy Assessment and Plan  Patient Details  Name: JEYDEN COFFELT MRN: [id number] Date of Birth: [birth year]  OT Diagnosis: acute pain, muscular wasting and disuse atrophy and muscle weakness (generalized) Rehab Potential: Rehab Potential (ACUTE ONLY): Good ELOS: 2 weeks   Today's Date: 08/17/2019 OT Individual Time: 4270-6237 & 1300-1415 OT Individual Time Calculation (min): 80 min   & 75 min  Problem List:  Patient Active Problem List   Diagnosis Date Noted  . Acute DVT (deep venous thrombosis) ([place]) 07/29/2019  . MSSA bacteremia 07/10/2019  . Abscess of right hip 07/09/2019  . Paraspinal abscess ([place]) 07/09/2019  . 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 Sepsis ([place]) 07/06/2019  . AKI (acute kidney injury) ([place]) 07/06/2019  . Hyperkalemia 07/06/2019  . Obesity, Class III, BMI 40-49.9 (morbid obesity) ([place]) 01/21/2009  . GERD 01/21/2009  . DYSPHAGIA 01/21/2009  . Essential hypertension 01/07/2009  . ASTHMA 01/07/2009    Past Medical History:  Past Medical History:  Diagnosis Date  . Arthritis   . Asthma   . Diabetes mellitus, type 2 ([place])   . Hypertension   . Morbid obesity ([place])   . OSA (obstructive sleep apnea)    Past Surgical History:  Past Surgical History:  Procedure Laterality Date  . APPLICATION OF WOUND VAC Right 07/17/2019   Procedure: Application Of Wound Vac;  Surgeon: Altamese Kenai, MD;  Location: [place];  Service: Orthopedics;  Laterality: Right;  . APPLICATION OF WOUND VAC Right 07/20/2019   Procedure: Application Of Wound Vac;  Surgeon: Altamese [place], MD;  Location: [place];  Service: Orthopedics;  Laterality: Right;  . APPLICATION OF WOUND VAC Right 07/27/2019   Procedure: Application Of Wound Vac;  Surgeon: Shona Needles, MD;  Location: [place];  Service: Orthopedics;  Laterality: Right;  . HERNIA REPAIR     As a baby  . I&D EXTREMITY Right 07/27/2019   Procedure: IRRIGATION AND DEBRIDEMENT EXTREMITY Right Leg;  Surgeon:  Shona Needles, MD;  Location: [place];  Service: Orthopedics;  Laterality: Right;  . I&D EXTREMITY Right 07/30/2019   Procedure: IRRIGATION AND DEBRIDEMENT EXTREMITY WITH WOUND VAC CHANGE;  Surgeon: Shona Needles, MD;  Location: [place];  Service: Orthopedics;  Laterality: Right;  IRRIGATION AND DEBRIDEMENT EXTREMITY WITH WOUND VAC CHANGE  . I&D EXTREMITY Right 08/08/2019   Procedure: IRRIGATION AND DEBRIDEMENT RIGHT EXTREMITY;  Surgeon: Shona Needles, MD;  Location: [place];  Service: Orthopedics;  Laterality: Right;  . INCISION AND DRAINAGE ABSCESS Right 07/09/2019   Procedure: INCISION AND DRAINAGE ABSCESS RIGHT HIP/PELVIS;  Surgeon: Shona Needles, MD;  Location: [place];  Service: Orthopedics;  Laterality: Right;  . INCISION AND DRAINAGE HIP Right 07/17/2019   Procedure: IRRIGATION AND DEBRIDEMENT HIP;  Surgeon: Altamese [place] Forge, MD;  Location: [place];  Service: Orthopedics;  Laterality: Right;  . INCISION AND DRAINAGE HIP Right 07/20/2019   Procedure: Irrigation And Debridement Hip;  Surgeon: Altamese Adelanto, MD;  Location: [place];  Service: Orthopedics;  Laterality: Right;  . TEE WITHOUT CARDIOVERSION N/A 07/11/2019   Procedure: TRANSESOPHAGEAL ECHOCARDIOGRAM (TEE);  Surgeon: Sanda Klein, MD;  Location: [facility];  Service: Cardiovascular;  Laterality: N/A;    Assessment & Plan Clinical Impression: Patient is a 48 y.o. year old male with history of hypertension, diabetes mellitus, morbid obesity, OSA. Per chart review lives with son . 1 level home 3 steps to entry. 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 Plans to discharge home withfamily.Presented 07/06/2019 with  complaints of right flank pain x3 weeks and recently seen in urgent care. Patient did recently receive a shot of Toradol to the right buttocks as well as steroid Dosepak. Patient reports low back pain radiating to the right gluteus/thigh area with progressive decline in right lower extremity strength and difficulty  in ambulation. Patient noted to have low-grade fever as well as tachycardia, tachypneic. Blood glucose was elevated with increased anion gap. 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 Placed on broad-spectrum antibiotics. Neurosurgery consulted did not feel needed surgical intervention and exam was consistent with peripheral nerve and lumbar plexus involvement. Underwent IR guided paraspinal abscess drainage per Dr. Doreatha Martin and blood culture completed showing MSSA. Infectious disease consulted advised antibiotic therapy x6 to 8 weeks with Ancef through 08/27/2019. Echocardiogram and TEE completed showing ejection fraction 22% normal systolic function no vegetation noted. Patient underwent I&D of large gluteal abscess and neuroplasty of sciatic nerve 07/09/2019 per Dr. Doreatha Martin followed by multiple right lower extremity I&D's and wound VAC placement with changes and later with I&D with closure removal of wound VAC 08/08/2019 and Hemovac drain placed that was later removed 08/10/2019 with persistent serosanguineous drainage from wound compressive wrap and dressing placed. Follow-up CT scan of the hip performed showing no abscess felt drainage most likely related to size of initial abscess . Hospital course complicated by right posterior tibial vein DVT 08/01/2019. CT angiogram of chest negative for pulmonary emboli and patient was started on Xarelto. Hospital course anemia patient has been transfused 2 units of packed red blood cells during hospital course latest hemoglobin 7.8.   Patient transferred to CIR on 08/16/2019 .    Patient currently requires mod with basic self-care skills secondary to muscle weakness, decreased cardiorespiratoy endurance and decreased standing balance and decreased postural control.  Prior to hospitalization, patient could complete adl with independent .  Patient will  benefit from skilled intervention to decrease level of assist with basic self-care skills and increase independence with basic self-care skills prior to discharge home with care partner.  Anticipate patient will require minimal physical assistance and follow up home health.  OT - End of Session Activity Tolerance: Tolerates 10 - 20 min activity with multiple rests Endurance Deficit: Yes Endurance Deficit Description: fatigue with adl tasks and drowsy t/o pm session OT Assessment Rehab Potential (ACUTE ONLY): Good OT Barriers to Discharge: Wound Care OT Patient demonstrates impairments in the following area(s): Balance;Edema;Endurance;Motor;Pain;Skin Integrity OT Basic ADL's Functional Problem(s): Grooming;Bathing;Dressing;Toileting OT Advanced ADL's Functional Problem(s): Simple Meal Preparation OT Transfers Functional Problem(s): Toilet OT Plan OT Intensity: Minimum of 1-2 x/day, 45 to 90 minutes OT Frequency: 5 out of 7 days OT Duration/Estimated Length of Stay: 2 weeks OT Treatment/Interventions: Balance/vestibular training;Self Care/advanced ADL retraining;Therapeutic Exercise;Wheelchair propulsion/positioning;DME/adaptive equipment instruction;Pain management;Skin care/wound managment;UE/LE Strength taining/ROM;Community reintegration;Patient/family education;Discharge planning;Functional mobility training;Therapeutic Activities OT Self Feeding Anticipated Outcome(s): independent OT Basic Self-Care Anticipated Outcome(s): mod I, incidental assist OT Toileting Anticipated Outcome(s): mod I OT Bathroom Transfers Anticipated Outcome(s): mod I OT Recommendation Patient destination: Home Follow Up Recommendations: Home health OT Equipment Details: per patient commode, RW and w/c already ordered - to be confirmed   Skilled Therapeutic Intervention AM session:  Patient in bed, alert and ready for therapy.  Evaluation completed as documented below.  Patient demonstrates intact cognition and  UB strength but is significantly limited by LB weakness/pain, wound management needs and generalized weakness.  He is an  excellent rehab candidate.  Reviewed role of OT, evaluation process, schedule for therapy, plan of care, goals for therapy, DME options, and discharge planning.  He demonstrates good understanding and is motivated to participate in rehab process.  Provided appropriate commode, w/c, cushion and RW for patient height and weight.  ADL completed as documented below - due to wound drainage and limited right LE mobility he requires significant assistance with LB care at this time.  Mobility:  Supine to/from SSP at edge of bed with min A to manage right LE.  Sit to stand from elevated surface with min A.  Short distance ambulation with RW CGA/min a with cues for technique.  Due to need for wound dressing change, he returned to bed at close of session - bed alarm set and call bell in reach.    PM session:  Patient in bed, alert but drowsy.  He states that he is eager to complete therapy session but feeling fatigued.  Completed unsupported sitting activity edge of bed with CS.  He is able to ambulate to/from bed, toilet/commode with RW CGA/min A with rest break but c/o dizziness/exhausion so limited activity for the remainder of session.  Supine UB conditioning activities with theraband and dowel, AAROM of right LE, core/trunk activities - excellent effort with all exercises but rest breaks needed between tasks.  Patient remained in bed at close of session, bed alarm set, call bell and tray table in reach.    OT Evaluation Precautions/Restrictions  Precautions Precautions: Fall Precaution Comments: Wound on R posterior thigh, sensitive to touch/movement of RLE Restrictions Weight Bearing Restrictions: Yes RLE Weight Bearing: Weight bearing as tolerated General Chart Reviewed: Yes Vital Signs Therapy Vitals Temp: 99.3 F (37.4 C) Temp Source: Oral Pulse Rate: 100 Resp: 18 BP:  126/77 Patient Position (if appropriate): Lying Oxygen Therapy SpO2: 95 % O2 Device: Room Air Pain Pain Assessment Pain Scale: 0-10 Pain Score: 5  Pain Type: Surgical pain Pain Location: Leg Pain Orientation: Right Pain Descriptors / Indicators: Tender;Pressure Pain Intervention(s): Repositioned Home Living/Prior Functioning Home Living Family/patient expects to be discharged to:: Private residence Living Arrangements: Parent Available Help at Discharge: Family, Available 24 hours/day, Friend(s) Type of Home: House Home Access: Stairs to enter Technical brewer of Steps: 3 Entrance Stairs-Rails: None Home Layout: One level Bathroom Shower/Tub: Multimedia programmer: Standard Bathroom Accessibility: Yes Additional Comments: per patient commode, RW and w/c already ordered  Lives With: Family Prior Function Level of Independence: Independent with basic ADLs, Independent with homemaking with ambulation, Independent with gait, Independent with transfers  Able to Take Stairs?: Yes Driving: Yes Vocation: Full time employment Leisure: Hobbies-yes (Comment)(paintball) 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 ADL Eating: Set up Where Assessed-Eating: Bed level Grooming: Contact guard Where Assessed-Grooming: Standing at sink Upper Body Bathing: Setup Where Assessed-Upper Body Bathing: Sitting at sink Lower Body Bathing: Maximal assistance Where Assessed-Lower Body Bathing: Bed level Upper Body Dressing: Setup Where Assessed-Upper Body Dressing: Wheelchair Lower Body Dressing: Maximal assistance Where Assessed-Lower Body Dressing: Wheelchair Toileting: Maximal assistance Where Assessed-Toileting: Glass blower/designer: Psychiatric nurse Method: Arts development officer: Bedside commode, Grab bars ADL Comments: not cleared to shower due to wounds Vision Baseline Vision/History: Wears glasses Wears Glasses:  (PRN) Patient Visual Report: No change from baseline Vision Assessment?: No apparent visual deficits Perception  Perception: Within Functional Limits Praxis Praxis: Intact Cognition Overall Cognitive Status: Within Functional Limits for tasks assessed Arousal/Alertness: Awake/alert Orientation Level: Person;Place;Situation Person: Oriented Place: Oriented  Situation: Oriented Year: 2020 Month: October Day of Week: Correct Memory: Appears intact Immediate Memory Recall: Sock;Blue;Bed Memory Recall Sock: Without Cue Memory Recall Blue: Without Cue Memory Recall Bed: Without Cue Attention: Focused Focused Attention: Appears intact Awareness: Appears intact Problem Solving: Appears intact Safety/Judgment: Impaired(appears to be pt's baseline) Comments: Pt relies heavily on momentum to initaite movement, occasionally unable to judge force of momentum being used Sensation Sensation Proprioception: Appears Intact Coordination Finger Nose Finger Test: UB coordination WFL bilaterally Motor  Motor Motor: Abnormal postural alignment and control Motor - Skilled Clinical Observations: R LE pain and weakness limit mobility/balance Mobility  Bed Mobility Bed Mobility: Sit to Supine Rolling Right: Supervision/verbal cueing Rolling Left: Supervision/Verbal cueing Supine to Sit: Contact Guard/Touching assist Sit to Supine: Minimal Assistance - Patient > 75% Transfers Sit to Stand: Minimal Assistance - Patient > 75%  Trunk/Postural Assessment  Cervical Assessment Cervical Assessment: Within Functional Limits Thoracic Assessment Thoracic Assessment: Within Functional Limits Lumbar Assessment Lumbar Assessment: Within Functional Limits Postural Control Postural Control: Deficits on evaluation(Minor LOB to L upon standing)  Balance Balance Balance Assessed: Yes Static Sitting Balance Static Sitting - Balance Support: No upper extremity supported Static Sitting - Level of  Assistance: 5: Stand by assistance Dynamic Sitting Balance Dynamic Sitting - Balance Support: No upper extremity supported Dynamic Sitting - Level of Assistance: 5: Stand by assistance Static Standing Balance Static Standing - Balance Support: Bilateral upper extremity supported(RW) Static Standing - Level of Assistance: 5: Stand by assistance Dynamic Standing Balance Dynamic Standing - Balance Support: Bilateral upper extremity supported Dynamic Standing - Level of Assistance: 4: Min assist Extremity/Trunk Assessment RUE Assessment RUE Assessment: Within Functional Limits General Strength Comments: 5/5, history of rotator cuff and elbow injury not currently limiting function LUE Assessment LUE Assessment: Within Functional Limits General Strength Comments: 5/5  recent biceps tendon tear per patient - not currently limiting function - please monitor     Refer to Care Plan for Long Term Goals  Recommendations for other services: None    Discharge Criteria: Patient will be discharged from OT if patient refuses treatment 3 consecutive times without medical reason, if treatment goals not met, if there is a change in medical status, if patient makes no progress towards goals or if patient is discharged from hospital.  The above assessment, treatment plan, treatment alternatives and goals were discussed and mutually agreed upon: by patient  Carlos Levering 08/17/2019, 2:54 PM

## 2019-08-18 ENCOUNTER — Inpatient Hospital Stay (HOSPITAL_COMMUNITY): Payer: Self-pay | Admitting: Occupational Therapy

## 2019-08-18 ENCOUNTER — Inpatient Hospital Stay (HOSPITAL_COMMUNITY): Payer: Self-pay | Admitting: Physical Therapy

## 2019-08-18 DIAGNOSIS — K5903 Drug induced constipation: Secondary | ICD-10-CM

## 2019-08-18 DIAGNOSIS — E871 Hypo-osmolality and hyponatremia: Secondary | ICD-10-CM

## 2019-08-18 DIAGNOSIS — I10 Essential (primary) hypertension: Secondary | ICD-10-CM

## 2019-08-18 DIAGNOSIS — D62 Acute posthemorrhagic anemia: Secondary | ICD-10-CM

## 2019-08-18 DIAGNOSIS — I82441 Acute embolism and thrombosis of right tibial vein: Secondary | ICD-10-CM

## 2019-08-18 DIAGNOSIS — G8918 Other acute postprocedural pain: Secondary | ICD-10-CM

## 2019-08-18 DIAGNOSIS — M4620 Osteomyelitis of vertebra, site unspecified: Secondary | ICD-10-CM

## 2019-08-18 DIAGNOSIS — E1165 Type 2 diabetes mellitus with hyperglycemia: Secondary | ICD-10-CM

## 2019-08-18 LAB — GLUCOSE, CAPILLARY
Glucose-Capillary: 146 mg/dL — ABNORMAL HIGH (ref 70–99)
Glucose-Capillary: 150 mg/dL — ABNORMAL HIGH (ref 70–99)
Glucose-Capillary: 125 mg/dL — ABNORMAL HIGH (ref 70–99)
Glucose-Capillary: 133 mg/dL — ABNORMAL HIGH (ref 70–99)

## 2019-08-18 NOTE — Progress Notes (Signed)
Occupational Therapy Session Note  Patient Details  Name: Bradley Cox MRN: [id number] Date of Birth: 1971-08-05  Today's Date: 08/18/2019 OT Individual Time: [id number] OT Individual Time Calculation (min): 62 min    Short Term Goals: Week 1:  OT Short Term Goal 1 (Week 1): patient will complete functional transfers and short distance ambulation with CS OT Short Term Goal 2 (Week 1): patient will complete LB dressing with mod A using ADs as needed  Skilled Therapeutic Interventions/Progress Updates:   As patient started session, this clincian noticed his posterior right leg bleeding through bandage and ace wraps, and patient noted his right thigh/hip bandage was wet.  To change bandages, patient required cues to roll into place to ease caregivers burdens and to max assist to help patient hold up left leg for RN to bandage both sites and ace wrap around knee.  Otherwise focus of session was as follows: Supine to edge of bed= exgra time and close S.     Edge of bed to w/c via walker = extra time and close S and this clincian assist to manage IV pole.     Patient participated in functional w/c mobility in kitchen due to fatigue as this clincian managed IV pole.   He was able to access refrigerator and simulate getting items in and out similar to when he discharges home.... required cues for w/c setup in relation to the refrigerator.  toileting to stand to void=close S walker level;   tlieting transfers= close S and asssit to mange his IV pole.  Patient participated in endurance activities to increase standing sitting, sit to stand and functional transfers.  Cointinue OT POC     Therapy Documentation Precautions:  Precautions Precautions: Fall Precaution Comments: Wound on R posterior thigh, sensitive to touch/movement of RLE Restrictions Weight Bearing Restrictions: Yes RLE Weight Bearing: Weight bearing as tolerated  Pain:rn gave meds Pain Assessment Pain Scale: 0-10 Pain  Score: 8  Pain Type: Surgical pain Pain Location: Leg Pain Orientation: Right Pain Descriptors / Indicators: Aching Pain Frequency: Constant Pain Onset: On-going Pain Intervention(s): Medication (See eMAR)  Therapy/Group: Individual Therapy  Alfredia Ferguson [facility] 08/18/2019, 5:11 PM

## 2019-08-18 NOTE — Progress Notes (Signed)
Physical Therapy Session Note  Patient Details  Name: Bradley Cox MRN: [id number] Date of Birth: September 29, 1971  Today's Date: 08/18/2019 PT Individual Time: [id number] PT Individual Time Calculation (min): 63 min   Short Term Goals: Week 1:  PT Short Term Goal 1 (Week 1): Pt will perfrom bed mobility with supervision PT Short Term Goal 2 (Week 1): Pt will perfrom car transfer with RW min A PT Short Term Goal 3 (Week 1): Pt will ambulate 46ft with RW min A  Skilled Therapeutic Interventions/Progress Updates:   Pt received sitting in w/c and pt agreeable to therapy session. RN reports pt cannot be unhooked from IV. Performed B UE w/c propulsion ~139ft to main therapy with supervision and therapist performing line management. Sit<>stands using RW with min assist for steadying and pt using momentum to get up into standing throughout session. Ambulated 22ft, 72ft (seated break between) using RW with min assist for balance and +2 assist for w/c follow in event of fatigue and for line management - demonstrates increased reliance on BUE support, decreased B LE step length, and decreased B LE step length - with increased distance pt demonstrated B LE muscle fatigue with shaking. During 2nd walk pt reported lightheadedness, assessed vitals once in sitting: BP 129/80 (MAP 96), HR 105bpm, SpO2 99% and reassessed in standing to check for orthostatic hypotension: BP 111/81 (MAP 90), HR 122bpm with pt denying lightheadedness at this time. Performed ~127ft B UE w/c propulsion back to main gym with supervision. Stand pivot w/c>EOM using RW with min assist for steadying/balance and min cuing for sequencing. Sit>supine with CGA for R LE management and pt using compensatory patterns to place R leg onto mat. Supine R LE heel slides x15 reps with assist for lifting/lowering LE and cuing for decreased speed of movement. Supine>sit with close supervision and pt continuing to demonstrate successful compensatory patterns for R  LE off mat due to pain and weakness. Stand pivot back to w/c using RW with min assist. Transported pt back to room in w/c and pt requesting to return to bed due to fatigue. Stand pivot w/c>EOB using RW with min assist for balance. Sit>supine with CGA for R LE management. Pt left supine in bed with needs in reach and bed alarm on.  Therapy Documentation Precautions:  Precautions Precautions: Fall Precaution Comments: Wound on R posterior thigh, sensitive to touch/movement of RLE Restrictions Weight Bearing Restrictions: Yes RLE Weight Bearing: Weight bearing as tolerated  Pain: Pt reporting R LE pain during session, unrated, that increases with movement; however, reports medication administration ~33minutes prior to PT arrival that helped with pain management. Therapist also provided rest breaks and repositioning as needed throughout session.   Therapy/Group: Individual Therapy  Tawana Scale, PT, DPT 08/18/2019, 4:11 PM

## 2019-08-18 NOTE — IPOC Note (Signed)
Individualized overall Plan of Care (IPOC) Patient Details Name: Bradley Cox MRN: [id number] DOB: 08-22-1971  Admitting Diagnosis: MSSA bacteremia  Hospital Problems: Principal Problem:   MSSA bacteremia Active Problems:   Obesity, Class III, BMI 40-49.9 (morbid obesity) ([place])   Essential hypertension   AKI (acute kidney injury) ([place])   Abscess of right hip   Paraspinal abscess ([facility])   Acute DVT (deep venous thrombosis) (HCC)   Hyponatremia   Drug induced constipation   Uncontrolled type 2 diabetes mellitus with hyperglycemia (H[facility])   Acute blood loss anemia   Postoperative pain     Functional Problem List: Nursing Bladder, Edema, Endurance, Medication Management, Motor, Pain, Perception, Safety, Skin Integrity  PT Balance, Edema, Endurance, Motor, Safety  OT Balance, Edema, Endurance, Motor, Pain, Skin Integrity  SLP    TR         Basic ADL's: OT Grooming, Bathing, Dressing, Toileting     Advanced  ADL's: OT Simple Meal Preparation     Transfers: PT Bed Mobility, Bed to Chair, Car  OT Toilet     Locomotion: PT Ambulation, Wheelchair Mobility, Stairs     Additional Impairments: OT    SLP        TR      Anticipated Outcomes Item Anticipated Outcome  Self Feeding independent  Swallowing      Basic self-care  mod I, incidental assist  Toileting  mod I   Bathroom Transfers mod I  Bowel/Bladder  mod assist  Transfers  supervision  Locomotion  supervision w/ LRAD  Communication     Cognition     Pain  pain less than 2  Safety/Judgment  mod assist   Therapy Plan: PT Intensity: Minimum of 1-2 x/day ,45 to 90 minutes PT Frequency: 5 out of 7 days PT Duration Estimated Length of Stay: 14 days OT Intensity: Minimum of 1-2 x/day, 45 to 90 minutes OT Frequency: 5 out of 7 days OT Duration/Estimated Length of Stay: 2 weeks      Team Interventions: Nursing Interventions Pain Management, Bladder Management, Disease Management/Prevention, Skin  Care/Wound Management, Medication Management, Discharge Planning  PT interventions Ambulation/gait training, Discharge planning, Functional mobility training, Therapeutic Activities, Balance/vestibular training, Disease management/prevention, Neuromuscular re-education, Skin care/wound management, Therapeutic Exercise, Wheelchair propulsion/positioning, DME/adaptive equipment instruction, Pain management, UE/LE Strength taining/ROM, Academic librarian, Barrister's clerk education, IT trainer, UE/LE Coordination activities  OT Interventions Training and development officer, Self Care/advanced ADL retraining, Therapeutic Exercise, Wheelchair propulsion/positioning, DME/adaptive equipment instruction, Pain management, Skin care/wound managment, UE/LE Strength taining/ROM, Academic librarian, Barrister's clerk education, Discharge planning, Functional mobility training, Therapeutic Activities  SLP Interventions    TR Interventions    SW/CM Interventions Discharge Planning, Psychosocial Support, Patient/Family Education   Barriers to Discharge MD  Medical stability, Wound care and Weight  Nursing Other (comments)    PT Wound Care    OT Wound Care    SLP      SW       Team Discharge Planning: Destination: PT-Home ,OT- Home , SLP-  Projected Follow-up: PT-Home health PT, OT-  Home health OT, SLP-  Projected Equipment Needs: PT-To be determined, OT-  , SLP-  Equipment Details: PT-Has been using his father's RW, OT-per patient commode, RW and w/c already ordered - to be confirmed Patient/family involved in discharge planning: PT- Patient,  OT-Patient, SLP-   MD ELOS: 11-14 days. Medical Rehab Prognosis: Good Assessment: 48 year old right-handed male with history of hypertension, diabetes mellitus, morbid obesity, OSA. Presented 07/06/2019 with complaints of right flank pain x3 weeks  and recently seen in urgent care. Patient did recently receive a shot of Toradol to the right buttocks as well as  steroid Dosepak. Patient reports low back pain radiating to the right gluteus/thigh area with progressive decline in right lower extremity strength and difficulty in ambulation. Patient noted to have low-grade fever as well as tachycardia, tachypneic. Blood glucose was elevated with increased anion gap. 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 Placed on broad-spectrum antibiotics. Neurosurgery consulted did not feel needed surgical intervention and exam was consistent with peripheral nerve and lumbar plexus involvement. Underwent IR guided paraspinal abscess drainage per Dr. Doreatha Martin and blood culture completed showing MSSA. Infectious disease consulted advised antibiotic therapy x6 to 8 weeks with Ancef through 08/27/2019. Echocardiogram and TEE completed showing ejection fraction [id number] normal systolic function no vegetation noted. Patient underwent I&D of large gluteal abscess and neuroplasty of sciatic nerve 07/09/2019 per Dr. Doreatha Martin followed by multiple right lower extremity I&D's and wound VAC placement with changes and later with I&D with closure removal of wound VAC 08/08/2019 and Hemovac drain placed that was later removed 08/10/2019 with persistent serosanguineous drainage from wound compressive wrap and dressing placed. Follow-up CT scan of the hip performed showing no abscess felt drainage most likely related to size of initial abscess . Hospital course complicated by right posterior tibial vein DVT 08/01/2019. CT angiogram of chest negative for pulmonary emboli and patient was started on Xarelto. Hospital course further complicated by anemia and patient has been transfused 2 units of packed red blood cells. Patient with resulting functional deficits with mobility, transfers, endurance, self-care.  We will set goals for mod I/supervision with PT/OT.  Due to the current state of  emergency, patients may not be receiving their 3-hours of Medicare-mandated therapy.  See Team Conference Notes for weekly updates to the plan of care

## 2019-08-18 NOTE — Progress Notes (Signed)
Bradley Cox PHYSICAL MEDICINE & REHABILITATION PROGRESS NOTE   Subjective/Complaints: Patient seen sitting up in bed this morning.  He states he slept well overnight.  He notes he had a good, but tiring first day of therapies yesterday.  Denies CP, SOB, N/V/D  Objective:   No results found. Recent Labs    08/16/19 0441 08/17/19 1038  WBC 5.8 8.6  HGB 7.8* 9.1*  HCT 26.1* 30.6*  PLT 222 277   Recent Labs    08/16/19 0441 08/17/19 1038  NA 136 132*  K 3.9 3.9  CL 98 94*  CO2 29 27  GLUCOSE 125* 109*  BUN 12 9  CREATININE 0.58* 0.60*  CALCIUM 8.9 9.1    Intake/Output Summary (Last 24 hours) at 08/18/2019 0851 Last data filed at 08/18/2019 0347 Gross per 24 hour  Intake 240 ml  Output 2450 ml  Net -2210 ml     Physical Exam: Vital Signs Blood pressure 120/82, pulse 97, temperature 98.2 F (36.8 C), temperature source Oral, resp. rate 16, weight (!) 156.6 kg, SpO2 97 %. Constitutional: No distress . Vital signs reviewed.  Morbidly obese. HENT: Normocephalic.  Atraumatic. Eyes: EOMI. No discharge. Cardiovascular: No JVD. Respiratory: Normal effort.  No stridor. GI: Non-distended. Skin: Warm and dry.  Intact. Psych: Normal mood.  Normal behavior. Musc: Right lower extremity edema and tenderness  Neurological: Alert Follows full commands Awareness of deficits.  Motor: Bilateral UE 5/5.  RLE 2/5 HF, 3/5 KE and 4/5 ADF/PF.  LLE: 4/5 prox to 5/5 distally.  Skin: Dressing on Lateral R hip-C/D/I Right lower extremity with significant serosanguineous drainage.  Incision clean and intact.   Assessment/Plan: 1. 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  Physiatrist is providing close team  supervision and 24 hour management of active medical problems listed below.  Physiatrist and rehab team continue to assess barriers to discharge/monitor patient progress toward functional and medical goals  Care Tool:  Bathing    Body parts bathed by patient: Right arm, Left arm, Chest, Abdomen, Front perineal area, Face   Body parts bathed by helper: Buttocks, Right upper leg, Left upper leg, Right lower leg, Left lower leg     Bathing assist Assist Level: Moderate Assistance - Patient 50 - 74%     Upper Body Dressing/Undressing Upper body dressing Upper body dressing/undressing activity did not occur (including orthotics): N/A(patient already have shirt on) What is the patient wearing?: Pull over shirt    Upper body assist Assist Level: Supervision/Verbal cueing    Lower Body Dressing/Undressing Lower body dressing    Lower body dressing activity did not occur: N/A(patient already have short on) What is the patient wearing?: Pants     Lower body assist Assist for lower body dressing: Maximal Assistance - Patient 25 - 49%     Toileting Toileting    Toileting assist Assist for toileting: Maximal Assistance - Patient 25 - 49% Assistive Device Comment: (condom cath )   Transfers Chair/bed transfer  Transfers assist     Chair/bed transfer assist level: Moderate Assistance - Patient 50 - 74%(RW)     Locomotion Ambulation   Ambulation assist      Assist level: Moderate Assistance - Patient 50 - 74% Assistive device: Walker-rolling Max distance: 39ft   Walk 10 feet activity  Assist     Assist level: Moderate Assistance - Patient - 50 - 74% Assistive device: Walker-rolling   Walk 50 feet activity   Assist Walk 50 feet with 2 turns activity did not occur: Safety/medical concerns(Fatigue, high pain levels)         Walk 150 feet activity   Assist Walk 150 feet activity did not occur: Safety/medical concerns(fatigue, high pain levels)          Walk 10 feet on uneven surface  activity   Assist Walk 10 feet on uneven surfaces activity did not occur: Safety/medical concerns(fatigue, high pain levels)         Wheelchair     Assist Will patient use wheelchair at discharge?: No             Wheelchair 50 feet with 2 turns activity    Assist            Wheelchair 150 feet activity     Assist          Blood pressure 120/82, pulse 97, temperature 98.2 F (36.8 C), temperature source Oral, resp. rate 16, weight (!) 156.6 kg, SpO2 97 %.  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 Status post multiple I&D's as well as wound VAC changes. Hemovac drain removed 08/10/2019  Continue CIR 2. Antithrombotics: -DVT/anticoagulation:Xarelto for right posterior tibial vein DVT identified on Dopplers 08/01/2019.  Monitor for increasing bleeding. -antiplatelet therapy: N/A 3. Pain Management:OxyContin 40 mg every 12 hours, Neurontin 300 mg 3 times daily, Robaxin 750 mg 3 times daily, oxycodone as needed  Relatively controlled on 10/17 4. Mood:Provide emotional support -antipsychotic agents: N/A 5. Neuropsych: This patientiscapable of making decisions on hisown behalf. 6. Skin/Wound Care:Compressive wrap and dressing changesat least daily-discussed with nursing. 7. Fluids/Electrolytes/Nutrition:Routine in and outs 8. ID/MSSA bacteremia/disseminated infection. Continue IV Ancef through 08/27/2019 9. Acute blood loss anemia. Continue iron supplement  Hemoglobin 9.1 on 10/16, labs ordered for Monday 10. Diabetes mellitus with hyperglycemia. Latest hemoglobin A1c 12.6. NovoLog 8 units 3 times daily with meals, insulin NPH 28 units twice daily.  -check CBG's ac/hs -adjust regimen as needed.   CBG (last 3)  Recent Labs    08/17/19 1635 08/17/19 2050 08/18/19 0618  GLUCAP 122* 135* 146*   Slightly labile on 10/7 11. Hypertension. Hydralazine 25 mg twice daily. Monitor with increased mobility  Controlled on 10/17 12. Morbid obesity. BMI 40-49.9. Dietary follow-up 13. Drug-induced constipation. Laxative assistance 14.  Hyponatremia  Sodium 132 on 10/16, labs ordered for Monday  LOS: 2 days A FACE TO FACE EVALUATION WAS PERFORMED   Bradley Cox 08/18/2019, 8:51 AM

## 2019-08-18 NOTE — Progress Notes (Signed)
Occupational Therapy Session Note  Patient Details  Name: Bradley Cox MRN: [id number] Date of Birth: 01-11-1971  Today's Date: 08/18/2019 OT Individual Time: 0900-1015 OT Individual Time Calculation (min): 75 min    Short Term Goals: Week 1:  OT Short Term Goal 1 (Week 1): patient will complete functional transfers and short distance ambulation with CS OT Short Term Goal 2 (Week 1): patient will complete LB dressing with mod A using ADs as needed  Skilled Therapeutic Interventions/Progress Updates: upon approach for OT patient requested to toilet and completed session as follows:  Supine to edge of bed = extra time and Close S.   Patient guarding right leg to help decrease pain.    Patient required several rest breaks for all activities, including after transferring supine to edge of bed.  Edge of bed sit to stand= close S (utilized walker)  Edge of bed to/fr w/c transfer= close S via [employer] transfer (patient ambulated with RW to/fr 3:1 over toilet)= close S  Toileting= Min A to cleanse as patient unable to reach buttocks. Patient asked for exgtra time to complete toileting.   Successful with very large volume, reported to nursing staff patient introduced to toilet aide to help reach buttocks...[name] KitchenMarland Kitchen he says he will consider purchasing  Oral care standing at sink (good dynamic standing balance and mod I for oral care)  Dynamic Standing actitivities = close S for 1 minutes per try.   X 4  Washing buttocks sit to stand with moderate assistance due to fatigue and difficulty maintaining balance without pain to reach to wash middle of rear perineum  Donning of shorts in seated position= cues to use Long handled reacher as paitent unable to leans forward in chair safely and unable to complete bilateral hip flexion and external rotation.     At end of session, patient assisted back to bed as he requested with moderate assistance to get right leg onto bed (was educated on putting  stronger left leg under right to help lift his own leg onto the bed  Patient lleft lying in bed with call belll and phone within reach  Continue OT POC      Therapy Documentation Precautions:  Precautions Precautions: Fall Precaution Comments: Wound on R posterior thigh, sensitive to touch/movement of RLE Restrictions Weight Bearing Restrictions: Yes RLE Weight Bearing: Weight bearing as tolerated  Pain:RN gave pain meds Pain Assessment Pain Scale: 0-10 Pain Score: 8  Pain Type: Surgical pain Pain Location: Leg Pain Orientation: Right Pain Descriptors / Indicators: Aching Pain Frequency: Constant Pain Onset: On-going Pain Intervention(s): Medication (See eMAR)   Therapy/Group: Individual Therapy  Alfredia Ferguson [facility] 08/18/2019, 3:58 PM

## 2019-08-19 ENCOUNTER — Inpatient Hospital Stay (HOSPITAL_COMMUNITY): Payer: Self-pay

## 2019-08-19 DIAGNOSIS — L02415 Cutaneous abscess of right lower limb: Secondary | ICD-10-CM

## 2019-08-19 LAB — GLUCOSE, CAPILLARY
Glucose-Capillary: 115 mg/dL — ABNORMAL HIGH (ref 70–99)
Glucose-Capillary: 153 mg/dL — ABNORMAL HIGH (ref 70–99)
Glucose-Capillary: 84 mg/dL (ref 70–99)
Glucose-Capillary: 156 mg/dL — ABNORMAL HIGH (ref 70–99)

## 2019-08-19 MED ORDER — SENNOSIDES-DOCUSATE SODIUM 8.6-50 MG PO TABS
2.0000 | ORAL_TABLET | Freq: Two times a day (BID) | ORAL | Status: DC
  Administered 2019-08-19 – 2019-08-29 (×20): 2 via ORAL
  Filled 2019-08-19 (×20): qty 2

## 2019-08-19 NOTE — Progress Notes (Signed)
[facility] PHYSICAL MEDICINE & REHABILITATION PROGRESS NOTE   Subjective/Complaints: Patient seen sitting up in bed this morning.  He states he slept well overnight.  He states he is awaiting dressing change.  ROS: Denies CP, SOB, N/V/D  Objective:   No results found. Recent Labs    08/17/19 1038  WBC 8.6  HGB 9.1*  HCT 30.6*  PLT 277   Recent Labs    08/17/19 1038  NA 132*  K 3.9  CL 94*  CO2 27  GLUCOSE 109*  BUN 9  CREATININE 0.60*  CALCIUM 9.1    Intake/Output Summary (Last 24 hours) at 08/19/2019 0808 Last data filed at 08/19/2019 0800 Gross per 24 hour  Intake 956 ml  Output 2600 ml  Net -1644 ml     Physical Exam: Vital Signs Blood pressure 113/68, pulse 95, temperature 98.7 F (37.1 C), temperature source Oral, resp. rate 16, weight (!) 156.6 kg, SpO2 95 %. Constitutional: No distress . Vital signs reviewed.  Morbidly obese. HENT: Normocephalic.  Atraumatic. Eyes: EOMI. No discharge. Cardiovascular: No JVD. Respiratory: Normal effort.  No stridor. GI: Non-distended. Psych: Normal mood.  Normal behavior. Musc: Right lower extremity edema and tenderness, improving Neurological: Alert Follows full commands Awareness of deficits.  Motor: Bilateral UE 5/5.  RLE 2/5 HF, 3/5 KE and 4/5 ADF/PF.  LLE: 4/5 prox to 5/5 distally.  Skin: Lateral R hip with significant serosanguineous drainage.  Incision intact Right lower extremity with significant serosanguineous drainage, but improving.   Assessment/Plan: 1. 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  Physiatrist is providing close team supervision and 24 hour management of active medical problems listed below.  Physiatrist and rehab  team continue to assess barriers to discharge/monitor patient progress toward functional and medical goals  Care Tool:  Bathing    Body parts bathed by patient: Front perineal area   Body parts bathed by helper: Buttocks     Bathing assist Assist Level: Moderate Assistance - Patient 50 - 74%     Upper Body Dressing/Undressing Upper body dressing Upper body dressing/undressing activity did not occur (including orthotics): N/A(patient already have shirt on) What is the patient wearing?: Pull over shirt    Upper body assist Assist Level: Supervision/Verbal cueing    Lower Body Dressing/Undressing Lower body dressing    Lower body dressing activity did not occur: N/A(patient already have short on) What is the patient wearing?: Pants     Lower body assist Assist for lower body dressing: Moderate Assistance - Patient 50 - 74%     Toileting Toileting    Toileting assist Assist for toileting: Minimal Assistance - Patient > 75% Assistive Device Comment: (pt used urinal)   Transfers Chair/bed transfer  Transfers assist     Chair/bed transfer assist level: Minimal Assistance - Patient > 75%     Locomotion Ambulation   Ambulation assist      Assist level: Minimal Assistance - Patient > 75% Assistive device: Walker-rolling Max distance: 75ft   Walk 10 feet activity   Assist     Assist level: Minimal Assistance - Patient > 75% Assistive device: Walker-rolling   Walk 50 feet activity   Assist Walk 50 feet with 2 turns activity did not occur: Safety/medical concerns(Fatigue, high pain levels)  Assist level: Minimal Assistance -  Patient > 75% Assistive device: Walker-rolling    Walk 150 feet activity   Assist Walk 150 feet activity did not occur: Safety/medical concerns(fatigue, high pain levels)         Walk 10 feet on uneven surface  activity   Assist Walk 10 feet on uneven surfaces activity did not occur: Safety/medical concerns(fatigue, high  pain levels)         Wheelchair     Assist Will patient use wheelchair at discharge?: No             Wheelchair 50 feet with 2 turns activity    Assist            Wheelchair 150 feet activity     Assist          Blood pressure 113/68, pulse 95, temperature 98.7 F (37.1 C), temperature source Oral, resp. rate 16, weight (!) 156.6 kg, SpO2 95 %.  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 Status post multiple I&D's as well as wound VAC changes. Hemovac drain removed 08/10/2019  Continue CIR 2. Antithrombotics: -DVT/anticoagulation:Xarelto for right posterior tibial vein DVT identified on Dopplers 08/01/2019.  Monitor for increasing bleeding. -antiplatelet therapy: N/A 3. Pain Management:OxyContin 40 mg every 12 hours, Neurontin 300 mg 3 times daily, Robaxin 750 mg 3 times daily, oxycodone as needed  Relatively controlled on 10/18 4. Mood:Provide emotional support -antipsychotic agents: N/A 5. Neuropsych: This patientiscapable of making decisions on hisown behalf. 6. Skin/Wound Care:Compressive wrap and dressing changesat least daily-discussed with nursing. 7. Fluids/Electrolytes/Nutrition:Routine in and outs 8. ID/MSSA bacteremia/disseminated infection. Continue IV Ancef through 08/27/2019 9. Acute blood loss anemia. Continue iron supplement  Hemoglobin 9.1 on 10/16, labs ordered for tomorrow 10. Diabetes mellitus with hyperglycemia. Latest hemoglobin A1c 12.6. NovoLog 8 units 3 times daily with meals, insulin NPH 28 units twice daily.  -check CBG's ac/hs -adjust regimen as needed.  CBG (last 3)  Recent Labs    08/18/19 1709 08/18/19 2326 08/19/19 0618  GLUCAP 125* 133* 115*   Slightly labile, but relatively controlled on  10/18 11. Hypertension. Hydralazine 25 mg twice daily. Monitor with increased mobility  Controlled on 10/18 12. Morbid obesity. BMI 40-49.9. Dietary follow-up 13. Drug-induced constipation. Laxative assistance  Bowel meds increased on 10/18 14.  Hyponatremia  Sodium 132 on 10/16, labs ordered for tomorrow  LOS: 3 days A FACE TO FACE EVALUATION WAS PERFORMED  Ranjit Ashurst Lorie Phenix 08/19/2019, 8:08 AM

## 2019-08-19 NOTE — Progress Notes (Signed)
Occupational Therapy Session Note  Patient Details  Name: Bradley Cox MRN: [id number] Date of Birth: 11-22-1970  Today's Date: 08/19/2019 OT Individual Time: 1530-1630 OT Individual Time Calculation (min): 60 min    Short Term Goals: Week 1:  OT Short Term Goal 1 (Week 1): patient will complete functional transfers and short distance ambulation with CS OT Short Term Goal 2 (Week 1): patient will complete LB dressing with mod A using ADs as needed  Skilled Therapeutic Interventions/Progress Updates:    Pt received supine with c/o pain 5/10 in R LE, stating previously administered medication. Pt with questions re BSC use and potential solutions for urinating while sitting on BSC. A funnel was obtained to place in commode to ensure urine makes it into the toilet below- pt pleased with this. Pt completed bed mobility with (S). Pt used RW to complete ambulatory transfer into bathroom with CGA. Pt transferred onto [facility] over toilet and voided BM. Pt required min A to change shorts sit <> stand from [facility]. Pt able to complete peri hygiene following toileting with CGA! Pt completed ambulatory transfer to the sink where he stood for oral hygiene. Pt reporting pain is now rising to a 9/10 and encouraged to take a seated rest break with deep breathing as pain relief (pt has already taken all medication available). Pt was taken to the therapy gym where he completed activity with mini squat and functional activity tolerance component, as well as functional reaching in standing. Pt required 2 seated rest breaks and c/o ongoing fatigue. Pt was returned to his room and left sitting up with all needs met, his mother present.   Therapy Documentation Precautions:  Precautions Precautions: Fall Precaution Comments: Wound on R posterior thigh, sensitive to touch/movement of RLE Restrictions Weight Bearing Restrictions: Yes RLE Weight Bearing: Weight bearing as tolerated   Therapy/Group: Individual  Therapy  Curtis Sites 08/19/2019, 3:59 PM

## 2019-08-20 ENCOUNTER — Inpatient Hospital Stay (HOSPITAL_COMMUNITY): Payer: Self-pay | Admitting: Physical Therapy

## 2019-08-20 ENCOUNTER — Inpatient Hospital Stay (HOSPITAL_COMMUNITY): Payer: Self-pay | Admitting: Occupational Therapy

## 2019-08-20 LAB — GLUCOSE, CAPILLARY
Glucose-Capillary: 108 mg/dL — ABNORMAL HIGH (ref 70–99)
Glucose-Capillary: 106 mg/dL — ABNORMAL HIGH (ref 70–99)
Glucose-Capillary: 104 mg/dL — ABNORMAL HIGH (ref 70–99)
Glucose-Capillary: 124 mg/dL — ABNORMAL HIGH (ref 70–99)

## 2019-08-20 LAB — CBC WITH DIFFERENTIAL/PLATELET
Abs Immature Granulocytes: 0.02 10*3/uL (ref 0.00–0.07)
Basophils Absolute: 0 10*3/uL (ref 0.0–0.1)
Basophils Relative: 1 %
Eosinophils Absolute: 0.2 10*3/uL (ref 0.0–0.5)
Eosinophils Relative: 4 %
HCT: 25.7 % — ABNORMAL LOW (ref 39.0–52.0)
Hemoglobin: 7.7 g/dL — ABNORMAL LOW (ref 13.0–17.0)
Immature Granulocytes: 0 %
Lymphocytes Relative: 37 %
Lymphs Abs: 2.2 10*3/uL (ref 0.7–4.0)
MCH: 28.4 pg (ref 26.0–34.0)
MCHC: 30 g/dL (ref 30.0–36.0)
MCV: 94.8 fL (ref 80.0–100.0)
Monocytes Absolute: 0.6 10*3/uL (ref 0.1–1.0)
Monocytes Relative: 10 %
Neutro Abs: 2.9 10*3/uL (ref 1.7–7.7)
Neutrophils Relative %: 48 %
Platelets: 236 10*3/uL (ref 150–400)
RBC: 2.71 MIL/uL — ABNORMAL LOW (ref 4.22–5.81)
RDW: 14.8 % (ref 11.5–15.5)
WBC: 6 10*3/uL (ref 4.0–10.5)
nRBC: 0 % (ref 0.0–0.2)

## 2019-08-20 LAB — COMPREHENSIVE METABOLIC PANEL
ALT: 10 U/L (ref 0–44)
AST: 12 U/L — ABNORMAL LOW (ref 15–41)
Albumin: 2.3 g/dL — ABNORMAL LOW (ref 3.5–5.0)
Alkaline Phosphatase: 63 U/L (ref 38–126)
Anion gap: 7 (ref 5–15)
BUN: 5 mg/dL — ABNORMAL LOW (ref 6–20)
CO2: 30 mmol/L (ref 22–32)
Calcium: 8.7 mg/dL — ABNORMAL LOW (ref 8.9–10.3)
Chloride: 99 mmol/L (ref 98–111)
Creatinine, Ser: 0.54 mg/dL — ABNORMAL LOW (ref 0.61–1.24)
GFR calc Af Amer: >60 mL/min (ref >60)
GFR calc non Af Amer: >60 mL/min (ref >60)
Glucose, Bld: 118 mg/dL — ABNORMAL HIGH (ref 70–99)
Potassium: 3.7 mmol/L (ref 3.5–5.1)
Sodium: 136 mmol/L (ref 135–145)
Total Bilirubin: 0.2 mg/dL — ABNORMAL LOW (ref 0.3–1.2)
Total Protein: 6.5 g/dL (ref 6.5–8.1)

## 2019-08-20 LAB — C-REACTIVE PROTEIN: CRP: 9.2 mg/dL — ABNORMAL HIGH (ref <1.0)

## 2019-08-20 LAB — SEDIMENTATION RATE: Sed Rate: 140 mm/hr — ABNORMAL HIGH (ref 0–16)

## 2019-08-20 NOTE — Progress Notes (Signed)
[facility] PHYSICAL MEDICINE & REHABILITATION PROGRESS NOTE   Subjective/Complaints:  BG dropped yesterday had Sx;s of low BG at 85 since it's been so high for awhile.  Also c/o dressing still draining sanguinous drainage- soaks through dressing esp with PT-  Hb 9.1 down to 7.7 over weekend-   ROS: Denies CP, SOB, N/V/D  Objective:   No results found. Recent Labs    08/17/19 1038 08/20/19 0408  WBC 8.6 6.0  HGB 9.1* 7.7*  HCT 30.6* 25.7*  PLT 277 236   Recent Labs    08/17/19 1038 08/20/19 0408  NA 132* 136  K 3.9 3.7  CL 94* 99  CO2 27 30  GLUCOSE 109* 118*  BUN 9 5*  CREATININE 0.60* 0.54*  CALCIUM 9.1 8.7*    Intake/Output Summary (Last 24 hours) at 08/20/2019 1018 Last data filed at 08/20/2019 0700 Gross per 24 hour  Intake 360 ml  Output 400 ml  Net -40 ml     Physical Exam: Vital Signs Blood pressure 114/69, pulse 93, temperature 98.6 F (37 C), temperature source Oral, resp. rate 16, weight (!) 156.6 kg, SpO2 96 %. Constitutional: No distress . Vital signs reviewed.  Morbidly obese. HENT: Normocephalic.  Atraumatic. Eyes: EOMI.conjugate gaze Cardiovascular: RRR but pulse is borderline tachycardia Respiratory: CTA B/L GI: Non-distended. Psych: full affect Musc: Right lower extremity edema and tenderness, improving- dressed in ACE wrap- no bleeding seen this AM Neurological: Alert Follows full commands Awareness of deficits.  Motor: Bilateral UE 5/5.  RLE 2/5 HF, 3/5 KE and 4/5 ADF/PF.  LLE: 4/5 prox to 5/5 distally.  Skin: Lateral R hip with significant serosanguineous drainage.  Incision intact Right lower extremity with significant serosanguineous drainage, but improving. (10/18)   Assessment/Plan: 1. 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  Physiatrist is providing close team supervision and 24 hour management of active medical problems listed below.  Physiatrist and rehab team continue to assess barriers to discharge/monitor patient progress toward functional and medical goals  Care Tool:  Bathing    Body parts bathed by patient: Front perineal area   Body parts bathed by helper: Buttocks     Bathing assist Assist Level: Moderate Assistance - Patient 50 - 74%     Upper Body Dressing/Undressing Upper body dressing Upper body dressing/undressing activity did not occur (including orthotics): N/A(patient already have shirt on) What is the patient wearing?: Pull over shirt    Upper body assist Assist Level: Supervision/Verbal cueing    Lower Body Dressing/Undressing Lower body dressing    Lower body dressing activity did not occur: N/A(patient already have short on) What is the patient wearing?: Pants     Lower body assist Assist for lower body dressing: Moderate Assistance - Patient 50 - 74%     Toileting Toileting    Toileting assist Assist for toileting: Minimal Assistance - Patient > 75% Assistive Device Comment: (pt used urinal)   Transfers Chair/bed transfer  Transfers assist     Chair/bed transfer assist level: Minimal Assistance - Patient > 75%     Locomotion Ambulation   Ambulation assist      Assist level: Minimal Assistance - Patient > 75% Assistive device: Walker-rolling Max distance: 77ft   Walk 10 feet activity   Assist  Assist level: Minimal Assistance - Patient > 75% Assistive device: Walker-rolling   Walk 50 feet activity   Assist Walk 50 feet with 2 turns activity did not occur: Safety/medical concerns(Fatigue, high pain levels)  Assist level: Minimal Assistance - Patient > 75% Assistive device: Walker-rolling    Walk 150 feet activity   Assist Walk 150 feet activity did not occur:  Safety/medical concerns(fatigue, high pain levels)         Walk 10 feet on uneven surface  activity   Assist Walk 10 feet on uneven surfaces activity did not occur: Safety/medical concerns(fatigue, high pain levels)         Wheelchair     Assist Will patient use wheelchair at discharge?: No             Wheelchair 50 feet with 2 turns activity    Assist            Wheelchair 150 feet activity     Assist          Blood pressure 114/69, pulse 93, temperature 98.6 F (37 C), temperature source Oral, resp. rate 16, weight (!) 156.6 kg, SpO2 96 %.  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 Status post multiple I&D's as well as wound VAC changes. Hemovac drain removed 08/10/2019  10/19- still having significant bleeding/Hb drop from 9.1 to 7.7 over weekend  Continue CIR 2. Antithrombotics: -DVT/anticoagulation:Xarelto for right posterior tibial vein DVT identified on Dopplers 08/01/2019.  Monitor for increasing bleeding. -antiplatelet therapy: N/A 3. Pain Management:OxyContin 40 mg every 12 hours, Neurontin 300 mg 3 times daily, Robaxin 750 mg 3 times daily, oxycodone as needed  Relatively controlled on 10/19 4. Mood:Provide emotional support -antipsychotic agents: N/A 5. Neuropsych: This patientiscapable of making decisions on hisown behalf. 6. Skin/Wound Care:Compressive wrap and dressing changesat least daily-discussed with nursing. 7. Fluids/Electrolytes/Nutrition:Routine in and outs 8. ID/MSSA bacteremia/disseminated infection. Continue IV Ancef through 08/27/2019 9. Acute blood loss anemia. Continue iron supplement  Hemoglobin 9.1 on 10/16, labs ordered for tomorrow  10/19- Hb down to 7.7!!! Will d/w surgeon- per PA, surgeon will see pt  today/tomorrow.  10. Diabetes mellitus with hyperglycemia. Latest hemoglobin A1c 12.6. NovoLog 8 units 3 times daily with meals, insulin NPH 28 units twice daily.  -check CBG's ac/hs -adjust regimen as needed.  CBG (last 3)  Recent Labs    08/19/19 1656 08/19/19 2052 08/20/19 0636  GLUCAP 84 156* 108*   Slightly labile, but relatively controlled on 10/19  10/19- had hypoglycemic at 39 yesterday-  11. Hypertension. Hydralazine 25 mg twice daily. Monitor with increased mobility  Controlled on 10/18 12. Morbid obesity. BMI 40-49.9. Dietary follow-up 13. Drug-induced constipation. Laxative assistance  Bowel meds increased on 10/18 14.  Hyponatremia  Sodium 132 on 10/16, labs ordered for tomorrow  10/19- Na 136  LOS: 4 days A FACE TO FACE EVALUATION WAS PERFORMED  Nickole Adamek 08/20/2019, 10:18 AM

## 2019-08-20 NOTE — Progress Notes (Signed)
Physical Therapy Session Note  Patient Details  Name: Bradley Cox MRN: [id number] Date of Birth: 01-19-1971  Today's Date: 08/20/2019 PT Individual Time: 1020-1100 PT Individual Time Calculation (min): 40 min   Short Term Goals: Week 1:  PT Short Term Goal 1 (Week 1): Pt will perfrom bed mobility with supervision PT Short Term Goal 2 (Week 1): Pt will perfrom car transfer with RW min A PT Short Term Goal 3 (Week 1): Pt will ambulate 69ft with RW min A  Skilled Therapeutic Interventions/Progress Updates:    Pt received supine in bed stating he didn't realize he had therapy at this time and was hoping to rest but still wanted to participate - pt reports he has had anxiety ever since being in the hospital - therapist provided emotional support and verified pt's schedule for remainder of the day to assist with decreased anxiety. Pt requesting for pain medication - RN notified and present for medication administration. Supine>sit using bedrails with supervision and increased time for R LE management. Sit>stand from elevated EOB without UE support and close supervision. Ambulated ~63ft EOB>BSC over toilet using RW with CGA for steadying. Standing LB clothing management and peri-care with close supervision and set-up assist - pt continent of bowel and bladder. Sit<>stand on/off BSC over toilet using grab bars with close supervision. Upon standing up from [facility] pt reported sudden onset of lightheadedness requiring standing break prior to ambulating forward. Ambulated ~78ft to sink using RW with CGA for steadying - pt demonstrated proper AD management at sink without cuing. Standing hand hygiene and oral care with CGA for steadying. Ambulated ~92ft back to bed using RW with CGA. Sit>supine with supervision and pt requiring increased effort to place R LE in bed but no assist needed. L sidelying R LE clamshell exercise 2x15 reps with pt demonstrating compensation via lower trunk rotation due to severe hip  external rotation weakness - max cuing and manual facilitation for correct technique. Pt left supine in bed with OT arriving to assume care of patient.  Therapy Documentation Precautions:  Precautions Precautions: Fall Precaution Comments: Wound on R posterior thigh, sensitive to touch/movement of RLE Restrictions Weight Bearing Restrictions: Yes RLE Weight Bearing: Weight bearing as tolerated  Pain: Reports pain level of 6-7/10 in R LE - RN notified and present for medication administration - provided rest breaks and repositioning for pain management.   Therapy/Group: Individual Therapy  Tawana Scale, PT, DPT 08/20/2019, 7:53 AM

## 2019-08-20 NOTE — Progress Notes (Addendum)
Occupational Therapy Session Note  Patient Details  Name: Bradley Cox MRN: [id number] Date of Birth: Apr 21, 1971  Today's Date: 08/20/2019 OT Individual Time: 1100-1200 OT Individual Time Calculation (min): 60 min    Short Term Goals: Week 1:  OT Short Term Goal 1 (Week 1): patient will complete functional transfers and short distance ambulation with CS OT Short Term Goal 2 (Week 1): patient will complete LB dressing with mod A using ADs as needed  Skilled Therapeutic Interventions/Progress Updates:    Pt seen for OT ADL bathing/dressing session. Pt in supine upon arrival with hand off from PT. Pt voicing increased fatigue from AM filled of therapies, willing to cont as able.  He declined getting into w/c to complete bathing/dressing routine, opting to complete sitting EOB. UB bathing/dressing completed with set-up/supervision.  Pt provided with LH sponge and reacher to assist with LB bathing/dressing. Used reacher to doff B socks and to thread on pants. He was able to cross R LE into figure four position in order to wash LE and don socks, LH sponge to wash L LE with supervision. He stood with steadying assist in order to complete pericare/buttock hygiene and LB clothing management.  Pt requesting to stay in bed at end of session, returned to supine and left with all needs in reach, bed alarm on  Noted a wound on bottom of pt's L foot. Pt reports stepping on an item PTA. RN made aware.   Therapy Documentation Precautions:  Precautions Precautions: Fall Precaution Comments: Wound on R posterior thigh, sensitive to touch/movement of RLE Restrictions Weight Bearing Restrictions: Yes RLE Weight Bearing: Weight bearing as tolerated   Therapy/Group: Individual Therapy  Henslee Lottman L 08/20/2019, 7:05 AM

## 2019-08-20 NOTE — Progress Notes (Signed)
Physical Therapy Session Note  Patient Details  Name: Bradley Cox MRN: [id number] Date of Birth: [birth year]  Today's Date: 08/20/2019 PT Individual Time: 0800-0900; 1415-1530 PT Individual Time Calculation (min): 60 min and 75 min  Short Term Goals: Week 1:  PT Short Term Goal 1 (Week 1): Pt will perfrom bed mobility with supervision PT Short Term Goal 2 (Week 1): Pt will perfrom car transfer with RW min A PT Short Term Goal 3 (Week 1): Pt will ambulate 98ft with RW min A  Skilled Therapeutic Interventions/Progress Updates:    Session 1: Pt received supine in bed, agreeable to PT session. Pt reports pain in R hip during session, not rated and receives pain medication from RN during session. Supine to sit with Supervision with HOB elevated and use of bedrail. Sit to stand with CGA to RW. Ambulation x 200 ft with RW and CGA, antalgic gait pattern with reliance on BUE for support with gait. Pt exhibits improved tolerance for gait training this date with increased ambulation distance from previous sessions. Standing BLE strengthening therex x 10 reps each with RW for support: marches, hip abd, HS curls. Pt reports tightness in R lateral hip and in hamstrings, education with patient about edema and tightness in RLE following injury due to location of incision. Manual w/c propulsion x 200 ft with use of BUE and Supervision. Pt agreeable to stay seated in w/c in room, needs in reach at end of session.  Session 2: Pt received semi-reclined in bed, agreeable to PT session. Pt reports 5/10 pain in RLE at rest, able to receive pain medication at end of session. Offered pt heat or ice for pain management and explained benefits of each, pt declines use of either modality. Bed mobility Supervision. Stand pivot transfer bed to w/c with CGA and RW. Ascend/descend 8 x 3" stairs with 2 handrails, 4 x 6" stairs with 2 handrails and min A, step-to gait pattern. Pt exhibits good ability to perform stairs this  date, no instances of LE buckling. Nustep level 1 x 5 min with use of LE only for global endurance training and ROM to R knee to tolerance. Pt has increase in pain with use of Nustep while performing activity that decreases at rest. Manual w/c propulsion x 200 ft with use of BUE and Supervision for global endurance training. Stand pivot transfer back to bed with RW and CGA. Sit to supine Supervision. Pt left semi-reclined in bed with needs in reach at end of session.  Therapy Documentation Precautions:  Precautions Precautions: Fall Precaution Comments: Wound on R posterior thigh, sensitive to touch/movement of RLE Restrictions Weight Bearing Restrictions: Yes RLE Weight Bearing: Weight bearing as tolerated    Therapy/Group: Individual Therapy   Excell Seltzer, PT, DPT  08/20/2019, 2:10 PM

## 2019-08-20 NOTE — Care Management (Signed)
[facility] Individual Statement of Services  Patient Name:  Bradley Cox  Date:  08/20/2019  Welcome to the [facility].  Our goal is to provide you with an individualized program based on your diagnosis and situation, designed to meet your specific needs.  With this comprehensive rehabilitation program, you will be expected to participate in at least 3 hours of rehabilitation therapies Monday-Friday, with modified therapy programming on the weekends.  Your rehabilitation program will include the following services:  Physical Therapy (PT), Occupational Therapy (OT), 24 hour per day rehabilitation nursing, Case Management (Social Worker), Rehabilitation Medicine, Nutrition Services and Pharmacy Services  Weekly team conferences will be held on Wednesdays to discuss your progress.  Your Social Worker will talk with you frequently to get your input and to update you on team discussions.  Team conferences with you and your family in attendance may also be held.  Expected length of stay: 2 weeks   Overall anticipated outcome: supervision  Depending on your progress and recovery, your program may change. Your Social Worker will coordinate services and will keep you informed of any changes. Your Social Worker's name and contact numbers are listed  below.  The following services may also be recommended but are not provided by the [facility] will be made to provide these services after discharge if needed.  Arrangements include referral to agencies that provide these services.  Your insurance has been verified to be:  None Your primary doctor is:  [facility]  Pertinent information will be shared with your doctor and your insurance company.  Social Worker:  [place], [place] or ([phone]   Information discussed with and copy given to patient by: Lennart Pall, 08/20/2019, 1:58 PM

## 2019-08-20 NOTE — Progress Notes (Signed)
Ortho Trauma Note  Still draining. Feels like it is slowing down. Pain getting better. Working hard with therapy.  Continue dressing changes. Will continue to follow remotely.  Shona Needles, MD [facility] [phone] (phone) [phone] (office) [email or web address]

## 2019-08-21 ENCOUNTER — Inpatient Hospital Stay (HOSPITAL_COMMUNITY): Payer: Self-pay | Admitting: Physical Therapy

## 2019-08-21 ENCOUNTER — Inpatient Hospital Stay (HOSPITAL_COMMUNITY): Payer: Self-pay | Admitting: Occupational Therapy

## 2019-08-21 ENCOUNTER — Inpatient Hospital Stay (HOSPITAL_COMMUNITY): Payer: Self-pay

## 2019-08-21 LAB — GLUCOSE, CAPILLARY
Glucose-Capillary: 130 mg/dL — ABNORMAL HIGH (ref 70–99)
Glucose-Capillary: 110 mg/dL — ABNORMAL HIGH (ref 70–99)
Glucose-Capillary: 133 mg/dL — ABNORMAL HIGH (ref 70–99)
Glucose-Capillary: 162 mg/dL — ABNORMAL HIGH (ref 70–99)

## 2019-08-21 NOTE — Progress Notes (Signed)
Occupational Therapy Session Note  Patient Details  Name: Bradley Cox MRN: [id number] Date of Birth: [birth year]  Today's Date: 08/21/2019 OT Individual Time: 1100-1200 and 1430-1500 OT Individual Time Calculation (min): 60 min and 30 min   Short Term Goals: Week 1:  OT Short Term Goal 1 (Week 1): patient will complete functional transfers and short distance ambulation with CS OT Short Term Goal 2 (Week 1): patient will complete LB dressing with mod A using ADs as needed  Skilled Therapeutic Interventions/Progress Updates:    Session One: PT seen for OT session focusing on UE strengthening and endurance. Pt in supine upon arrival. Complaints of cont R LE pain as he had with PT. Reports being up to date on pain meds and requesting to stay at bed level. RN and PA already aware of pt's pain.  Pt completed UE strengthening exercises as noted below using #5 dowel rod, x2 sets of 10. Chest press Shoulder flexion Shoulder ABduction Instructed in theraband execises for pt to complete outside of therapy sessions, completed x1set of 10 using level III (green) theraband with multi-modal cuing for proper form and techniques.  Completed chest Abduction, diagonal up, and diagonal down.  Pt left in supine to rest at end of session, all needs in reach and bed alarm on.  Education/discussion throughout session regarding OT/PT goals, activity progression, pt goals, and d/c planning.  Pt tearful and frustrated by inability to tolerate OOB mobility during session, wants to try again during PM session.   Session Two: Pt seen for OT session focusing on functional mobility, education and transfers. Pt sitting up in supine upon arrival, voiced R LE feeling better and ready for mobility. He transferred to sitting EOB with supervision using hospital bed functions. He ambulated throughout unit to ADL apartment with RW and guarding assist. Completed sit<>stand from low soft recliner. Mod A for controlled descent  and required +2 for sit>stand from recliner to assist in stabilizing all equipment.  While pt seated in recliner, extensive education/discussion regarding IADL re-training, specifically in regards to cooking/kitchen activities, problem solving energy conservation methods, etc.  Pt returned to room at end of session, returned to supine with supervision. Pt left seated in supine with all needs in reach and RN present.   Therapy Documentation Precautions:  Precautions Precautions: Fall Precaution Comments: Wound on R posterior thigh, sensitive to touch/movement of RLE Restrictions Weight Bearing Restrictions: Yes RLE Weight Bearing: Weight bearing as tolerated   Therapy/Group: Individual Therapy  Debbrah Sampedro L 08/21/2019, 6:56 AM

## 2019-08-21 NOTE — Progress Notes (Signed)
Physical Therapy Session Note  Patient Details  Name: Bradley Cox MRN: [id number] Date of Birth: Mar 27, 1971  Today's Date: 08/21/2019 PT Individual Time: 1300-1330 PT Individual Time Calculation (min): 30 min   Short Term Goals: Week 1:  PT Short Term Goal 1 (Week 1): Pt will perfrom bed mobility with supervision PT Short Term Goal 2 (Week 1): Pt will perfrom car transfer with RW min A PT Short Term Goal 3 (Week 1): Pt will ambulate 31ft with RW min A  Skilled Therapeutic Interventions/Progress Updates:    Session focused on functional transfers, weigthbearing tolerance and upright mobility due to RLE pain today limiting patient, and gait training with RW. Pt eager to attempt OOB this afternoon but does still have pain in RLE with gait towards end of session. Pt performs basic transfers with close supervision to CGA with cues for hand placement and technique with RW. Pt able to gait x 100' x2 with close supervision to CGA needed as fatiguing for balance. 1 episode of mild LOB with CGA to recover and pt uses hand on wall for support. End of session returned back to bed with supervision and all needs in reach.   Therapy Documentation Precautions:  Precautions Precautions: Fall Precaution Comments: Wound on R posterior thigh, sensitive to touch/movement of RLE Restrictions Weight Bearing Restrictions: Yes RLE Weight Bearing: Weight bearing as tolerated Pain:  Reports pain in RLE is still there but not as bad as earlier. RN aware.   Therapy/Group: Individual Therapy  [name], PT, DPT, CBIS  08/21/2019, 3:25 PM

## 2019-08-21 NOTE — Progress Notes (Signed)
Physical Therapy Session Note  Patient Details  Name: Bradley Cox MRN: [id number] Date of Birth: 08-17-1971  Today's Date: 08/21/2019 PT Individual Time: 0900-1005 PT Individual Time Calculation (min): 65 min  PT Missed Time: 10 min Missed Time Reason: pain  Short Term Goals: Week 1:  PT Short Term Goal 1 (Week 1): Pt will perfrom bed mobility with supervision PT Short Term Goal 2 (Week 1): Pt will perfrom car transfer with RW min A PT Short Term Goal 3 (Week 1): Pt will ambulate 50ft with RW min A  Skilled Therapeutic Interventions/Progress Updates:    Pt received supine in bed, agreeable to PT session. Pt reports 7/10 pain at rest in R hip, increases with WBing. Pt is premedicated prior to start of therapy session. Supine to sit with Supervision with HOB slightly elevated and use of bedrail. Sit to stand with min A to RW, increased time needed 2/2 pain. Ambulation to bathroom with RW and CGA. Toilet transfer with CGA, see Flowsheet for details. Pt is setup A to perform pericare and Supervision for clothing management in standing. Standing balance with close Supervision while washing hands at sink. Ambulation x 100 ft with RW and CGA. Pt has sudden onset of R calf pain while in standing, returned to sitting in w/c. Ongoing pain pt describes as "sharp and shooting". Pt exhibits edema in RLE and skin is tender to the touch. Pt requests to stretch his calves. Sit to stand with CGA to RW on red therapy wedge. Pt is able to tolerate standing calf stretch x 30 sec before needing to sit with increase in calf pain from [place]. RN and PA notified of R calf pain as pt is a risk for DVTs as he already has a DVT in RLE. Assisted pt with returning to bed, Supervision for bed mobility. Provided hot pack to RLE for pain management. Pt becomes emotional and discouraged as he is motivated to be mobile and d/c from hospital. Provided emotional support to pt as well as encouragement. Pt left semi-reclined in bed  with needs in reach at end of session. Pt missed 10 min of scheduled therapy session 2/2 pain in R calf region.  Therapy Documentation Precautions:  Precautions Precautions: Fall Precaution Comments: Wound on R posterior thigh, sensitive to touch/movement of RLE Restrictions Weight Bearing Restrictions: Yes RLE Weight Bearing: Weight bearing as tolerated    Therapy/Group: Individual Therapy   Excell Seltzer, PT, DPT  08/21/2019, 12:39 PM

## 2019-08-21 NOTE — Progress Notes (Signed)
[place] PHYSICAL MEDICINE & REHABILITATION PROGRESS NOTE   Subjective/Complaints:  No new complaints. Still some drainage from wounds. Feels that he's making progress  ROS: Patient denies fever, rash, sore throat, blurred vision, nausea, vomiting, diarrhea, cough, shortness of breath or chest pain,   headache, or mood change.    Objective:   No results found. Recent Labs    08/20/19 0408  WBC 6.0  HGB 7.7*  HCT 25.7*  PLT 236   Recent Labs    08/20/19 0408  NA 136  K 3.7  CL 99  CO2 30  GLUCOSE 118*  BUN 5*  CREATININE 0.54*  CALCIUM 8.7*    Intake/Output Summary (Last 24 hours) at 08/21/2019 0852 Last data filed at 08/21/2019 0838 Gross per 24 hour  Intake 756 ml  Output 400 ml  Net 356 ml     Physical Exam: Vital Signs Blood pressure 120/70, pulse 90, temperature 99 F (37.2 C), temperature source Oral, resp. rate 17, weight (!) 156.6 kg, SpO2 96 %. Constitutional: No distress . Vital signs reviewed. obese HEENT: EOMI, oral membranes moist Neck: supple Cardiovascular: RRR without murmur. No JVD    Respiratory: CTA Bilaterally without wheezes or rales. Normal effort    GI: BS +, non-tender, non-distended  Psych: full affect Musc: Right lower extremity edema and tenderness, improving- dressed in ACE wrap Neurological: Alert Follows full commands Awareness of deficits.  Motor: Bilateral UE 5/5.  RLE 2/5 HF, 3/5 KE and 4/5 ADF/PF.  LLE: 4/5 prox to 5/5 distally.  Skin: Lateral R hip with mild serosanguineous drainage.  Incision intact Right lower extremity with decreased serosanguineous drainage. Sutures intact.   Assessment/Plan: 1. 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  Physiatrist is providing close team supervision and 24 hour management of active medical problems listed below.  Physiatrist and rehab team continue to assess barriers to discharge/monitor patient progress toward functional and medical goals  Care Tool:  Bathing    Body parts bathed by patient: Right arm, Left arm, Chest, Abdomen, Front perineal area, Buttocks, Right upper leg, Left upper leg, Right lower leg, Left lower leg, Face   Body parts bathed by helper: Buttocks     Bathing assist Assist Level: Minimal Assistance - Patient > 75%(LH Sponge)     Upper Body Dressing/Undressing Upper body dressing Upper body dressing/undressing activity did not occur (including orthotics): N/A(patient already have shirt on) What is the patient wearing?: Pull over shirt    Upper body assist Assist Level: Set up assist    Lower Body Dressing/Undressing Lower body dressing    Lower body dressing activity did not occur: N/A(patient already have short on) What is the patient wearing?: Pants     Lower body assist Assist for lower body dressing: Minimal Assistance - Patient > 75%     Toileting Toileting    Toileting assist Assist for toileting: Minimal Assistance - Patient > 75% Assistive Device Comment: (pt used urinal)   Transfers Chair/bed transfer  Transfers assist     Chair/bed transfer assist level: Contact Guard/Touching assist     Locomotion Ambulation   Ambulation assist      Assist level: Contact Guard/Touching assist Assistive device: Walker-rolling Max distance: 200'   Walk 10 feet activity   Assist  Assist level: Contact Guard/Touching assist Assistive device: Walker-rolling   Walk 50 feet activity   Assist Walk 50 feet with 2 turns activity did not occur: Safety/medical concerns(Fatigue, high pain levels)  Assist level: Contact Guard/Touching assist Assistive device: Walker-rolling    Walk 150 feet activity   Assist Walk 150  feet activity did not occur: Safety/medical concerns(fatigue, high pain levels)  Assist level: Contact Guard/Touching assist Assistive device: Walker-rolling    Walk 10 feet on uneven surface  activity   Assist Walk 10 feet on uneven surfaces activity did not occur: Safety/medical concerns(fatigue, high pain levels)         Wheelchair     Assist Will patient use wheelchair at discharge?: No Type of Wheelchair: Manual    Wheelchair assist level: Supervision/Verbal cueing Max wheelchair distance: 200'    Wheelchair 50 feet with 2 turns activity    Assist        Assist Level: Supervision/Verbal cueing   Wheelchair 150 feet activity     Assist      Assist Level: Supervision/Verbal cueing   Blood pressure 120/70, pulse 90, temperature 99 F (37.2 C), temperature source Oral, resp. rate 17, weight (!) 156.6 kg, SpO2 96 %.  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 Status post multiple I&D's as well as wound VAC changes. Hemovac drain removed 08/10/2019   Continue CIR therapies 2. Antithrombotics: -DVT/anticoagulation:Xarelto for right posterior tibial vein DVT identified on Dopplers 08/01/2019.  Monitor for increasing bleeding. -antiplatelet therapy: N/A 3. Pain Management:OxyContin 40 mg every 12 hours, Neurontin 300 mg 3 times daily, Robaxin 750 mg 3 times daily, oxycodone as needed  Relatively controlled on 10/20 4. Mood:Provide emotional support -antipsychotic agents: N/A 5. Neuropsych: This patientiscapable of making decisions on hisown behalf. 6. Skin/Wound Care:Compressive wrap and dressing changesat least daily-discussed with nursing. 7. Fluids/Electrolytes/Nutrition:Routine in and outs 8. ID/MSSA bacteremia/disseminated infection. Continue IV Ancef  through 08/27/2019 9. Acute blood loss anemia. Continue iron supplement  Hemoglobin 9.1 on 10/16, labs ordered for tomorrow  10/19- Hb down to 7.7  10/20-no changes in wounds. Look a little better even   -recheck cbc tomorrow 10. Diabetes mellitus with hyperglycemia. Latest hemoglobin A1c 12.6. NovoLog 8 units 3 times daily with meals, insulin NPH 28 units twice daily.  -check CBG's ac/hs    CBG (last 3)  Recent Labs    08/20/19 1658 08/20/19 2034 08/21/19 0621  GLUCAP 104* 124* 130*   Slightly labile, but relatively controlled on 10/19  10/19- had hypoglycemic at 85 yesterday  -10/20 sugars more consistent today---no changes 11. Hypertension. Hydralazine 25 mg twice daily. Monitor with increased mobility  Controlled on 10/18 12. Morbid obesity. BMI 40-49.9. Dietary follow-up 13. Drug-induced constipation. Laxative assistance  Bowel meds increased on 10/18 14.  Hyponatremia  Sodium 132 on 10/16   10/19- Na 136  LOS: 5 days A FACE TO FACE EVALUATION WAS PERFORMED  Meredith Staggers 08/21/2019, 8:52 AM

## 2019-08-21 NOTE — Plan of Care (Signed)
  Problem: Consults Goal: RH GENERAL PATIENT EDUCATION Description: See Patient Education module for education specifics. Outcome: Progressing Goal: Skin Care Protocol Initiated - if Braden Score 18 or less Description: If consults are not indicated, leave blank or document N/A Outcome: Progressing Goal: Nutrition Consult-if indicated Outcome: Progressing Goal: Diabetes Guidelines if Diabetic/Glucose > 140 Description: If diabetic or lab glucose is > 140 mg/dl - Initiate Diabetes/Hyperglycemia Guidelines & Document Interventions  Outcome: Progressing   Problem: RH BOWEL ELIMINATION Goal: RH STG MANAGE BOWEL WITH ASSISTANCE Description: STG Manage Bowel with Assistance. Outcome: Progressing Goal: RH STG MANAGE BOWEL W/MEDICATION W/ASSISTANCE Description: STG Manage Bowel with Medication with Assistance. Outcome: Progressing   Problem: RH BLADDER ELIMINATION Goal: RH STG MANAGE BLADDER WITH ASSISTANCE Description: STG Manage Bladder With Assistance Outcome: Progressing Goal: RH STG MANAGE BLADDER WITH EQUIPMENT WITH ASSISTANCE Description: STG Manage Bladder With Equipment With Assistance Outcome: Progressing   Problem: RH SKIN INTEGRITY Goal: RH STG SKIN FREE OF INFECTION/BREAKDOWN Outcome: Progressing Goal: RH STG MAINTAIN SKIN INTEGRITY WITH ASSISTANCE Description: STG Maintain Skin Integrity With Assistance. Outcome: Progressing   Problem: RH SAFETY Goal: RH STG ADHERE TO SAFETY PRECAUTIONS W/ASSISTANCE/DEVICE Description: STG Adhere to Safety Precautions With Assistance/Device. Outcome: Progressing   Problem: RH PAIN MANAGEMENT Goal: RH STG PAIN MANAGED AT OR BELOW PT'S PAIN GOAL Outcome: Progressing   Problem: RH KNOWLEDGE DEFICIT GENERAL Goal: RH STG INCREASE KNOWLEDGE OF SELF CARE AFTER HOSPITALIZATION Outcome: Progressing

## 2019-08-22 ENCOUNTER — Inpatient Hospital Stay (HOSPITAL_COMMUNITY): Payer: Self-pay

## 2019-08-22 ENCOUNTER — Inpatient Hospital Stay (HOSPITAL_COMMUNITY): Payer: Self-pay | Admitting: Occupational Therapy

## 2019-08-22 LAB — CBC
HCT: 24.5 % — ABNORMAL LOW (ref 39.0–52.0)
Hemoglobin: 7.4 g/dL — ABNORMAL LOW (ref 13.0–17.0)
MCH: 28.7 pg (ref 26.0–34.0)
MCHC: 30.2 g/dL (ref 30.0–36.0)
MCV: 95 fL (ref 80.0–100.0)
Platelets: 224 10*3/uL (ref 150–400)
RBC: 2.58 MIL/uL — ABNORMAL LOW (ref 4.22–5.81)
RDW: 14.6 % (ref 11.5–15.5)
WBC: 5.3 10*3/uL (ref 4.0–10.5)
nRBC: 0 % (ref 0.0–0.2)

## 2019-08-22 LAB — GLUCOSE, CAPILLARY
Glucose-Capillary: 114 mg/dL — ABNORMAL HIGH (ref 70–99)
Glucose-Capillary: 93 mg/dL (ref 70–99)
Glucose-Capillary: 60 mg/dL — ABNORMAL LOW (ref 70–99)
Glucose-Capillary: 55 mg/dL — ABNORMAL LOW (ref 70–99)
Glucose-Capillary: 86 mg/dL (ref 70–99)
Glucose-Capillary: 135 mg/dL — ABNORMAL HIGH (ref 70–99)

## 2019-08-22 NOTE — Progress Notes (Signed)
Physical Therapy Session Note  Patient Details  Name: Bradley Cox MRN: [id number] Date of Birth: Jan 14, 1971  Today's Date: 08/22/2019 PT Individual Time: 1030-1130, 1415-1530 PT Individual Time Calculation (min): 60 min , 75 min  Short Term Goals: Week 1:  PT Short Term Goal 1 (Week 1): Pt will perfrom bed mobility with supervision PT Short Term Goal 2 (Week 1): Pt will perfrom car transfer with RW min A PT Short Term Goal 3 (Week 1): Pt will ambulate 46ft with RW min A      Skilled Therapeutic Interventions/Progress Updates:  tx 1:  Pt resting in bed.  He rated pain 7/10 R hip , premedicated.  His mother observed tx in room.  R foot noted to be moderatley/severly edematous; PTwrapped foot up to ACEs already in place over stitches R calf.  Amy, OT reported that pt's R hip external rotation is resulting in foot bumping RW in standing and ambulation.  Therapeutic exercises performed with LEs and trunk to increase strength for functional mobility: supine-10 x 1 L straight leg raises, R short arc quad knee extension;  15 x 1 each: bil hip internal rotation, L unilateral bridging: 5 x 3  active assistive R heel slides, 2 x 15 cervical flexion.  Sit>< supine with supervision in flat bed, no rails.  PT instructed pt in paced breathing for pain mgt during exercises, and relaxation imagery/diaphragmatc breathing for pain mgt/anxiety PRN. Pt return-demonstrated good carry over with min cues for technique.  Gait training with RW over level tile, x 100' with several turns, CGA.  Pt did not bump R foot against RW due to hip external rotation; he stated he was consciously avoiding it.  At end of session, pt resting in flat bed, needs at hand and alarm set.  tx 2:  Pt resting in bed.  Pain R hip rated 6/10; premedicated.  Pt would like to try lying on L side.   Pt able to tolerate lying on R side, with flexed bil hips and knees, pillow between knees. Also, PT educated pt on use of various-  sized towel rolls for positioning R hip in neutral rotation when in supine and sin sitting in w/c.    Reviewed pain -meds timing for optimal participation in therapy.   Therapeutic exercises performed with LEs to increase strength for functional mobility: L side lying: 5 x 2 each R hip abduction (clam shells), R isolated hip extension with flexed knee, weight supported by PT; seated EOB: 10 x 1 bil hip adduction, bil heel raises, bil toe raises.  Pt reported that his father does not have rails at entrance to his home, and has 3 STE.  Discussed managing steps backwards with RW. Gait training with RW up/down curb/step approx 5 in high, CGA, x 2 with RW, backwards.  Up/down 1 step bil rails, backwards iwht CGA.  Cues for technique.    At end of session, pt seated in w/c with needs at hand, and bil towel rolls to help position hips in neutral rotation. Mother present.  PT informed Mikayla , NT that pt would probably ask to get back to bed in 30 min.       Therapy Documentation Precautions:  Precautions Precautions: Fall Precaution Comments: Wound on R posterior thigh, sensitive to touch/movement of RLE Restrictions Weight Bearing Restrictions: Yes RLE Weight Bearing: Weight bearing as tolerated       Therapy/Group: Individual Therapy  Antonina Deziel 08/22/2019, 12:23 PM

## 2019-08-22 NOTE — Progress Notes (Signed)
Hypoglycemic Event  CBG: 60  Treatment: 8oz of orange juice given and dinner eaten.  Symptoms: Patient was asymptomatic.   Follow-up CBG: Time:1742 CBG Result:86  Possible Reasons for Event: Novolog for meal coverage not given due to hypoglycemic episode, charge nurse notified. Policy protocol followed.   Comments/MD notified:    Audie Clear

## 2019-08-22 NOTE — Progress Notes (Signed)
[place] PHYSICAL MEDICINE & REHABILITATION PROGRESS NOTE   Subjective/Complaints:  Had calf pain yesterday with weight bearing a little better in afternoon. I   ROS: Patient denies fever, rash, sore throat, blurred vision, nausea, vomiting, diarrhea, cough, shortness of breath or chest pain, joint or back pain, headache, or mood change.     Objective:   No results found. Recent Labs    08/20/19 0408 08/22/19 0437  WBC 6.0 5.3  HGB 7.7* 7.4*  HCT 25.7* 24.5*  PLT 236 224   Recent Labs    08/20/19 0408  NA 136  K 3.7  CL 99  CO2 30  GLUCOSE 118*  BUN 5*  CREATININE 0.54*  CALCIUM 8.7*    Intake/Output Summary (Last 24 hours) at 08/22/2019 [id number] Last data filed at 08/22/2019 0551 Gross per 24 hour  Intake 636 ml  Output 800 ml  Net -164 ml     Physical Exam: Vital Signs Blood pressure 108/73, pulse 86, temperature 98.3 F (36.8 C), temperature source Oral, resp. rate 17, weight (!) 156.6 kg, SpO2 96 %. Constitutional: No distress . Vital signs reviewed. HEENT: EOMI, oral membranes moist Neck: supple Cardiovascular: RRR without murmur. No JVD    Respiratory: CTA Bilaterally without wheezes or rales. Normal effort    GI: BS +, non-tender, non-distended  Psych: full affect Musc: Right lower extremity edema and tenderness, improving-has ACE wrap Neurological: Alert Follows full commands Awareness of deficits.  Motor: Bilateral UE 5/5.  RLE 2/5 HF, 3/5 KE and 4/5 ADF/PF.  LLE: 4/5 prox to 5/5 distally.  Skin: Wounds not visualized today   Assessment/Plan: 1. 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  Physiatrist is providing close team supervision and 24 hour management of active medical  problems listed below.  Physiatrist and rehab team continue to assess barriers to discharge/monitor patient progress toward functional and medical goals  Care Tool:  Bathing    Body parts bathed by patient: Right arm, Left arm, Chest, Abdomen, Front perineal area, Buttocks, Right upper leg, Left upper leg, Right lower leg, Left lower leg, Face   Body parts bathed by helper: Buttocks     Bathing assist Assist Level: Minimal Assistance - Patient > 75%(LH Sponge)     Upper Body Dressing/Undressing Upper body dressing Upper body dressing/undressing activity did not occur (including orthotics): N/A(patient already have shirt on) What is the patient wearing?: Pull over shirt    Upper body assist Assist Level: Set up assist    Lower Body Dressing/Undressing Lower body dressing    Lower body dressing activity did not occur: N/A(patient already have short on) What is the patient wearing?: Pants     Lower body assist Assist for lower body dressing: Minimal Assistance - Patient > 75%     Toileting Toileting    Toileting assist Assist for toileting: Minimal Assistance - Patient > 75% Assistive Device Comment: (pt used urinal)   Transfers Chair/bed transfer  Transfers assist     Chair/bed transfer assist level: Contact Guard/Touching assist     Locomotion Ambulation   Ambulation assist      Assist level: Contact Guard/Touching assist Assistive device: Walker-rolling Max distance: 100'   Walk 10 feet activity   Assist     Assist level: Supervision/Verbal cueing Assistive device: Walker-rolling  Walk 50 feet activity   Assist Walk 50 feet with 2 turns activity did not occur: Safety/medical concerns(Fatigue, high pain levels)  Assist level: Contact Guard/Touching assist Assistive device: Walker-rolling    Walk 150 feet activity   Assist Walk 150 feet activity did not occur: Safety/medical concerns(fatigue, high pain levels)  Assist level: Contact  Guard/Touching assist Assistive device: Walker-rolling    Walk 10 feet on uneven surface  activity   Assist Walk 10 feet on uneven surfaces activity did not occur: Safety/medical concerns(fatigue, high pain levels)         Wheelchair     Assist Will patient use wheelchair at discharge?: No Type of Wheelchair: Manual    Wheelchair assist level: Supervision/Verbal cueing Max wheelchair distance: 200'    Wheelchair 50 feet with 2 turns activity    Assist        Assist Level: Supervision/Verbal cueing   Wheelchair 150 feet activity     Assist      Assist Level: Supervision/Verbal cueing   Blood pressure 108/73, pulse 86, temperature 98.3 F (36.8 C), temperature source Oral, resp. rate 17, weight (!) 156.6 kg, SpO2 96 %.  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 Status post multiple I&D's as well as wound VAC changes. Hemovac drain removed 08/10/2019   Continue CIR therapies 2. Antithrombotics: -DVT/anticoagulation:Xarelto for right posterior tibial vein DVT identified on Dopplers 08/01/2019.  Monitor for increasing bleeding. -antiplatelet therapy: N/A 3. Pain Management:OxyContin 40 mg every 12 hours, Neurontin 300 mg 3 times daily, Robaxin 750 mg 3 times daily, oxycodone as needed  Calf pain likely from ambulation, irritation from wound. Encouraged stretching prior to gait/threapy 4. Mood:Provide emotional support -antipsychotic agents: N/A 5. Neuropsych: This patientiscapable of making decisions on hisown behalf. 6. Skin/Wound Care:Compressive wrap and dressing changesat least daily-discussed with nursing. 7. Fluids/Electrolytes/Nutrition:Routine in and outs 8. ID/MSSA bacteremia/disseminated infection. Continue IV Ancef through 08/27/2019 9. Acute  blood loss anemia. Continue iron supplement  Hemoglobin 9.1 on 10/16    Hb 7.7---->7.4 today---continue serial checks  Wounds improving 10. Diabetes mellitus with hyperglycemia. Latest hemoglobin A1c 12.6. NovoLog 8 units 3 times daily with meals, insulin NPH 28 units twice daily.  -check CBG's ac/hs    CBG (last 3)  Recent Labs    08/21/19 1633 08/21/19 2105 08/22/19 0551  GLUCAP 133* 162* 114*   -10/21 sugars improving---no changes 11. Hypertension. Hydralazine 25 mg twice daily. Monitor with increased mobility  Controlled on 10/21 12. Morbid obesity. BMI 40-49.9. Dietary follow-up 13. Drug-induced constipation. Laxative assistance  Liquid by 10/20 14.  Hyponatremia  Sodium 132 on 10/16   10/19- Na 136  LOS: 6 days A FACE TO FACE EVALUATION WAS PERFORMED  Meredith Staggers 08/22/2019, 8:12 AM

## 2019-08-22 NOTE — Progress Notes (Signed)
Occupational Therapy Session Note  Patient Details  Name: Bradley Cox MRN: [id number] Date of Birth: July 15, 1971  Today's Date: 08/22/2019 OT Individual Time: [id number] OT Individual Time Calculation (min): 60 min    Short Term Goals: Week 1:  OT Short Term Goal 1 (Week 1): patient will complete functional transfers and short distance ambulation with CS OT Short Term Goal 2 (Week 1): patient will complete LB dressing with mod A using ADs as needed  Skilled Therapeutic Interventions/Progress Updates:    Pt seen for OT session focusing on functional transfers and mobility. Pt sitting up in supine upon arrival, denied pain at rest and agreable to tx session.  He transferred to sitting EOB with supervision. Completed sit<>stands throughout session with RW and close supervision. He ambulated to sink and completed oral care standing at sink with hygiene.  He ambulated to ADL apartment using RW with close supervision. Completed simulated tub/shower transfer with CGA following demonstration and VCs for technique. Pt able to manage B LEs over tub wall with increased time/effort.  Completed kitchen mobility at ambulatory level using RW. Practiced retrieving items from higher cabinet, alternating UE support on RW with supervision. Discussed kitchen set-up/layout and modified methods for meal prep tasks including energy conservation methods.  Pt ambulated back to room and completed toileting task with close supervision, using RW and grab bars. Hand hygiene completed standing at sink with close supervision. Pt returned to bed at end of session, left in supine with all needs in reach and bed alarm on.   Therapy Documentation Precautions:  Precautions Precautions: Fall Precaution Comments: Wound on R posterior thigh, sensitive to touch/movement of RLE Restrictions Weight Bearing Restrictions: Yes RLE Weight Bearing: Weight bearing as tolerated   Therapy/Group: Individual Therapy  Ebelyn Bohnet  L 08/22/2019, 6:57 AM

## 2019-08-23 ENCOUNTER — Inpatient Hospital Stay (HOSPITAL_COMMUNITY): Payer: Self-pay

## 2019-08-23 ENCOUNTER — Inpatient Hospital Stay (HOSPITAL_COMMUNITY): Payer: Self-pay | Admitting: Occupational Therapy

## 2019-08-23 ENCOUNTER — Inpatient Hospital Stay: Payer: Self-pay | Admitting: Internal Medicine

## 2019-08-23 LAB — CBC WITH DIFFERENTIAL/PLATELET
Abs Immature Granulocytes: 0.01 10*3/uL (ref 0.00–0.07)
Basophils Absolute: 0 10*3/uL (ref 0.0–0.1)
Basophils Relative: 1 %
Eosinophils Absolute: 0.3 10*3/uL (ref 0.0–0.5)
Eosinophils Relative: 5 %
HCT: 25.7 % — ABNORMAL LOW (ref 39.0–52.0)
Hemoglobin: 7.4 g/dL — ABNORMAL LOW (ref 13.0–17.0)
Immature Granulocytes: 0 %
Lymphocytes Relative: 35 %
Lymphs Abs: 1.9 10*3/uL (ref 0.7–4.0)
MCH: 27.7 pg (ref 26.0–34.0)
MCHC: 28.8 g/dL — ABNORMAL LOW (ref 30.0–36.0)
MCV: 96.3 fL (ref 80.0–100.0)
Monocytes Absolute: 0.5 10*3/uL (ref 0.1–1.0)
Monocytes Relative: 10 %
Neutro Abs: 2.7 10*3/uL (ref 1.7–7.7)
Neutrophils Relative %: 49 %
Platelets: 251 10*3/uL (ref 150–400)
RBC: 2.67 MIL/uL — ABNORMAL LOW (ref 4.22–5.81)
RDW: 14.7 % (ref 11.5–15.5)
WBC: 5.4 10*3/uL (ref 4.0–10.5)
nRBC: 0 % (ref 0.0–0.2)

## 2019-08-23 LAB — COMPREHENSIVE METABOLIC PANEL
ALT: 9 U/L (ref 0–44)
AST: 13 U/L — ABNORMAL LOW (ref 15–41)
Albumin: 2.2 g/dL — ABNORMAL LOW (ref 3.5–5.0)
Alkaline Phosphatase: 54 U/L (ref 38–126)
Anion gap: 10 (ref 5–15)
BUN: 5 mg/dL — ABNORMAL LOW (ref 6–20)
CO2: 27 mmol/L (ref 22–32)
Calcium: 8.6 mg/dL — ABNORMAL LOW (ref 8.9–10.3)
Chloride: 98 mmol/L (ref 98–111)
Creatinine, Ser: 0.44 mg/dL — ABNORMAL LOW (ref 0.61–1.24)
GFR calc Af Amer: >60 mL/min (ref >60)
GFR calc non Af Amer: >60 mL/min (ref >60)
Glucose, Bld: 120 mg/dL — ABNORMAL HIGH (ref 70–99)
Potassium: 3.8 mmol/L (ref 3.5–5.1)
Sodium: 135 mmol/L (ref 135–145)
Total Bilirubin: 0.2 mg/dL — ABNORMAL LOW (ref 0.3–1.2)
Total Protein: 6.2 g/dL — ABNORMAL LOW (ref 6.5–8.1)

## 2019-08-23 LAB — GLUCOSE, CAPILLARY
Glucose-Capillary: 99 mg/dL (ref 70–99)
Glucose-Capillary: 140 mg/dL — ABNORMAL HIGH (ref 70–99)
Glucose-Capillary: 142 mg/dL — ABNORMAL HIGH (ref 70–99)
Glucose-Capillary: 90 mg/dL (ref 70–99)
Glucose-Capillary: 114 mg/dL — ABNORMAL HIGH (ref 70–99)
Glucose-Capillary: 154 mg/dL — ABNORMAL HIGH (ref 70–99)

## 2019-08-23 MED ORDER — METHOCARBAMOL 500 MG PO TABS
1000.0000 mg | ORAL_TABLET | Freq: Four times a day (QID) | ORAL | Status: DC
  Administered 2019-08-23 – 2019-08-29 (×24): 1000 mg via ORAL
  Filled 2019-08-23 (×24): qty 2

## 2019-08-23 MED ORDER — NYSTATIN 100000 UNIT/ML MT SUSP
5.0000 mL | Freq: Four times a day (QID) | OROMUCOSAL | Status: AC
  Administered 2019-08-23 – 2019-08-28 (×20): 500000 [IU] via ORAL
  Filled 2019-08-23 (×20): qty 5

## 2019-08-23 NOTE — Progress Notes (Signed)
Occupational Therapy Weekly Progress Note  Patient Details  Name: Bradley Cox MRN: [id number] Date of Birth: 1970-12-04  Beginning of progress report period: August 17, 2019 End of progress report period: August 23, 2019  Today's Date: 08/23/2019 OT Individual Time: 1415-1500 OT Individual Time Calculation (min): 45 min    Patient has met 2 of 2 short term goals.  Pt is making steady progress towards OT goals. He is completing functional transfers and mobility with RW and close supervision-CGA. Addressing ADL/IADL re-training with emphasis on energy conservation and standing balance/endurance. Pt making steady progress towards LTG of overall mod I level for ADLs and IADLs  Patient continues to demonstrate the following deficits:muscle weakness, decreased cardiorespiratoy endurance and decreased standing balance and decreased postural control and therefore will continue to benefit from skilled OT intervention to enhance overall performance with BADL and iADL.  Patient progressing toward long term goals..  Continue plan of care.  OT Short Term Goals Week 1:  OT Short Term Goal 1 (Week 1): patient will complete functional transfers and short distance ambulation with CS OT Short Term Goal 1 - Progress (Week 1): Met OT Short Term Goal 2 (Week 1): patient will complete LB dressing with mod A using ADs as needed OT Short Term Goal 2 - Progress (Week 1): Met Week 2:  OT Short Term Goal 1 (Week 2): STG=LTG due to LOS  Skilled Therapeutic Interventions/Progress Updates:    Pt seen for OT session focusing on functional mobility, transfers and IADL re-training. Pt in supine upon arrival, agreeable to tx session. No complaints of pain.  He ambulated throughout session with supervision using RW. In ADL apartment, completed simulated transfer on standard bed in simulation of home environment, completed at [place] I level EOB<>supine.  In kitchen, completed functional mobility within  kitchen to boil water, obtaining items from low cabinet and transporting along kitchen counter to fill pot with water and boil on stove. Pt tolerating ~10 minutes in standing before requiring seated rest break.  Following seated rest break, he ambulated within family room to make cup of coffee using Keur[employer]. Completed with supervision, able to to stand without UE support in order to make coffee. Pt ambulated from family room back to his room with close supervision and standing rest breaks throughout. Pt left seated EOB to rest at end of session, all needs in reach with plans to return to supine.   Therapy Documentation Precautions:  Precautions Precautions: Fall Precaution Comments: Wound on R posterior thigh, sensitive to touch/movement of RLE Restrictions Weight Bearing Restrictions: Yes RLE Weight Bearing: Weight bearing as tolerated   Therapy/Group: Individual Therapy  Imraan Wendell L 08/23/2019, 12:52 PM

## 2019-08-23 NOTE — Patient Care Conference (Signed)
Inpatient RehabilitationTeam Conference and Plan of Care Update Date: 08/22/2019   Time: 9:40 AM    Patient Name: Bradley Cox      Medical Record Number: [id number]  Date of Birth: [birth year] Sex: Male         Room/Bed: 4M01C/4M01C-01 Payor Info: Payor: MEDICAID POTENTIAL / Plan: MEDICAID POTENTIAL / Product Type: *No Product type* /    Admit Date/Time:  08/16/2019  2:59 PM  Primary Diagnosis:  MSSA bacteremia  Patient Active Problem List   Diagnosis Date Noted  . Hyponatremia   . Drug induced constipation   . Uncontrolled type 2 diabetes mellitus with hyperglycemia ([place])   . Acute blood loss anemia   . Postoperative pain   . Acute DVT (deep venous thrombosis) ([place]) 07/29/2019  . MSSA bacteremia 07/10/2019  . Abscess of right hip 07/09/2019  . Paraspinal abscess ([place]) 07/09/2019  . 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 Sepsis ([place]) 07/06/2019  . AKI (acute kidney injury) ([place]) 07/06/2019  . Hyperkalemia 07/06/2019  . Obesity, Class III, BMI 40-49.9 (morbid obesity) ([place]) 01/21/2009  . GERD 01/21/2009  . DYSPHAGIA 01/21/2009  . Essential hypertension 01/07/2009  . ASTHMA 01/07/2009    Expected Discharge Date: Expected Discharge Date: 08/29/19  Team Members Present: Physician leading conference: Dr. Delice Lesch Social Worker Present: Lennart Pall, LCSW Nurse Present: Judee Clara, LPN PT Present: Excell Seltzer, PT OT Present: Amy Rounds, OT SLP Present: Weston Anna, SLP PPS Coordinator present : Gunnar Fusi, SLP     Current Status/Progress Goal Weekly Team Focus  Bowel/Bladder   Continent of B/B. LBM 10/20  Remains continent  Assist with toileting  as needed   Swallow/Nutrition/ Hydration             ADL's   CGA-close supervision overall using RW  Mod I overall  ADL/IADL re-training, functional transfers, activity tolerance, d/c planning   Mobility   Supervision bed mobility, CGA to min A transfers with RW, amb up to 200' with RW CGA, min A  stairs with 2 handrails  Supervision overall, CGA stairs  endurance with gait, stair training   Communication             Safety/Cognition/ Behavioral Observations            Pain   Scheduled oxycontin and Robaxin PRN oxycodone and Tylenol. Pt is usually 6 - 9 pain level on a pain scale of 0 -10  Pain level of </= 3 on pain scale  Assess for pain and address appropriately   Skin   Right hip surgical scar draining moderate amount of serous drainage and copious amount of serosanguineous drainage to right popliteal surgical site  Free from infection to surgical sites  Assess skin and surgical site for infection, redness and breakdown    Rehab Goals Patient on target to meet rehab goals: Yes *See Care Plan and progress notes for long and short-term goals.     Barriers to Discharge  Current Status/Progress Possible Resolutions Date Resolved   Nursing  IV antibiotics;Medical stability               PT                    OT                  SLP                SW  Discharge Planning/Teaching Needs:  Pt to d/c to father's home where 24/7 support is available.  Teaching to be completed prior to d/c.   Team Discussion:  IV abx to complete soon;  Drainage improving?  Close supervision - min overall with therapies and supervision to mod ind goals.    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, L3-S1 paraspinal abscess, right psoas abscess, right posterior thigh abscess Weekly Focus/Goal: Improve mobility, self-care, wounds, bacteremia, hyponatremia, ABLA  Barriers to Discharge: IV antibiotics;Medical stability;Home enviroment access/layout   Possible Resolutions to Barriers: Therapies, follow labs, dressing changes as necessary, cont abx   Continued Need for Acute Rehabilitation Level of Care: The patient requires daily medical management by a physician  with specialized training in physical medicine and rehabilitation for the following reasons: Direction of a multidisciplinary physical rehabilitation program to maximize functional independence : Yes Medical management of patient stability for increased activity during participation in an intensive rehabilitation regime.: Yes Analysis of laboratory values and/or radiology reports with any subsequent need for medication adjustment and/or medical intervention. : Yes   I attest that I was present, lead the team conference, and concur with the assessment and plan of the team.   Lennart Pall 08/23/2019, 3:12 PM   Team conference was held via web/ teleconference due to [place] - 19

## 2019-08-23 NOTE — Progress Notes (Signed)
Social Work Patient ID: Jill Poling, male   DOB: [birth year], 48 y.o.   MRN: [id number]  Met with pt and his mother yesterday afternoon to review team conference.  Both aware and agreeable with targeted d/c date of 10/28 and supervision/ mod ind goals overall.  Pt concerned that therapies might be "disappointed" in his current progress.  Reassured that I had not heard any report of concern from the team.  Will continue to follow.  Takari Duncombe, LCSW

## 2019-08-23 NOTE — Progress Notes (Signed)
Physical Therapy Session Note  Patient Details  Name: Bradley Cox MRN: [id number] Date of Birth: [birth year]  Today's Date: 08/23/2019 PT Individual Time: 1100-1200 and 758- 9:30   PT Individual Time Calculation (min): 60 min and 92 min  Short Term Goals: Week 1:  PT Short Term Goal 1 (Week 1): Pt will perfrom bed mobility with supervision PT Short Term Goal 2 (Week 1): Pt will perfrom car transfer with RW min A PT Short Term Goal 3 (Week 1): Pt will ambulate 26ft with RW min A Week 2:    Week 3:     Skilled Therapeutic Interventions/Progress Updates:    PAIN 5/10 R ant thigh/hip LE Treatment to tolerance., repositioning for improved comfort.  Pt initially supine w/need for acewrapping to RLE due to current wrap ending at ankle, rsumed below knee, and cylindrical wrapping pattern. Increased edema/ fluid collection noted in unwrapped distal LE.  Removed acewrapping and educated pt re figure 8 wrapping for edema control.  Therapist rewrapped and demonstrated for pt proper technique.  Pt stated limb felt better w/fig 8 acewrap.  Supine to sit on EOB w/supervision.  Pt sat 2-3 min "to get his bearings".  STS from elevated bed to RW w/cga. Gait 23ft in room to bathroom, commode transfer w/RW and cga using BSC over commode. to elevate surface Pt assisted w/removal of shorts from RLE and w/threading feet thru legs clean shorts. Pt w/large BM, nursing notified.  Performs hygiene w/set up and cga for balance in standing.  Able to manage shorts without assist. Gait 10-12 ft to sink where he stood to wash hands w/supervision. Stand turn sit to wc w/rw and cga.  wc propulsion to gym x 100+ft mod I including turns/doorway negotiation. Pt became diaphoretic and stated he felt "weird".  Returned pt to room where vitals and blood sugar were checked and all WNL: Blood sugar 138, BP 105/78, HR 92, temp 99.   Provided pt w/cool cloth and pt complaints passed. Returned to gym/transported for time  efficiency.  STS in parallel bars and performed the following RLE therex w/seated rest between each therex: Hip abd x 15 Hip flexion x 5 Sidestepping length of bars x 4  Returned to room.  WC to bed w/RW and cga.  Sit to supine w/supervision. Pt left supine w/rails up x 3, alarm set, bed in lowest position, and needs in reach.  Second am Session: Pain: 6/10 RLE ant hip/thigh, treatment to tolerance, pt provided w/moist heat applied to R hip at end of session for pain mgmnt.    Supine to sit on edge of bed w/supervision STS w/RW and gait 8ft to BR w/supervision, pt stood mod I w/RW to urinate., gt 10-33ft to sink where pt stood and washed hands w/supervison. Seated rest x 4 min due to fatigue/pain. Gait 41ft, 32ft W/RW and close supervision, slow cadence due to pain. 4-5 min rest between efforts due to pain/fatigue. SEATED therex including RLE LAQ 2x5, knee flexion 2x5 w/towel on floor to decrease friction. SPT wc to be w/RW and supervision. Sit to supine mod I w/rails. Pt provided w/moist heat to R hip due to pain.  Instructed pt to remove after 10-15 min or if it became too warm/uncomfortable.  Pt agreed.    Primary limiting factors today include episode of diaphoresis and significant pain RLE.   Therapy Documentation Precautions:  Precautions Precautions: Fall Precaution Comments: Wound on R posterior thigh, sensitive to touch/movement of RLE Restrictions Weight Bearing Restrictions: Yes RLE Weight  Bearing: Weight bearing as tolerated    Therapy/Group: Individual Therapy  Callie Fielding, PT   Jerrilyn Cairo 08/23/2019, 12:12 PM

## 2019-08-23 NOTE — Progress Notes (Signed)
Ortho Trauma Note  Still draining but feels like it is slowing down. Patient could only tolerate half of sutures being removed, 6 sutures remaining. Pain getting somewhat better. Working hard with therapy.  Continue dressing changes. Will remove remaining sutures tomorrow. Will continue to follow remotely.  Jene Huq A. [facility] ?[phone]? (phone)

## 2019-08-23 NOTE — Progress Notes (Signed)
[facility] PHYSICAL MEDICINE & REHABILITATION PROGRESS NOTE   Subjective/Complaints:  Woke up sore this morning in right leg. Saw him PT at [facility] and was struggling with pain. Sore throat too  ROS: Patient denies fever, rash, sore throat, blurred vision, nausea, vomiting, diarrhea, cough, shortness of breath or chest pain,  headache, or mood change.     Objective:   No results found. Recent Labs    08/22/19 0437 08/23/19 0450  WBC 5.3 5.4  HGB 7.4* 7.4*  HCT 24.5* 25.7*  PLT 224 251   Recent Labs    08/23/19 0450  NA 135  K 3.8  CL 98  CO2 27  GLUCOSE 120*  BUN 5*  CREATININE 0.44*  CALCIUM 8.6*    Intake/Output Summary (Last 24 hours) at 08/23/2019 0933 Last data filed at 08/23/2019 0825 Gross per 24 hour  Intake 1350 ml  Output 500 ml  Net 850 ml     Physical Exam: Vital Signs Blood pressure 116/65, pulse 72, temperature 98.8 F (37.1 C), temperature source Oral, resp. rate 18, weight (!) 156.6 kg, SpO2 97 %. Constitutional: No distress . Vital signs reviewed. HEENT: EOMI, oral membranes moist, + thrush Neck: supple Cardiovascular: RRR without murmur. No JVD    Respiratory: CTA Bilaterally without wheezes or rales. Normal effort    GI: BS +, non-tender, non-distended  Psych: full affect Musc: Right lower extremity edema and tenderness, improving-has ACE wrap Neurological: Alert Follows full commands Awareness of deficits.  Motor: Bilateral UE 5/5.  RLE 2/5 HF, 3/5 KE and 4/5 ADF/PF.  LLE: 4/5 prox to 5/5 distally.  Skin: Wounds dressed with mild s/s drainage   Assessment/Plan: 1. 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  Physiatrist is providing close team supervision and  24 hour management of active medical problems listed below.  Physiatrist and rehab team continue to assess barriers to discharge/monitor patient progress toward functional and medical goals  Care Tool:  Bathing    Body parts bathed by patient: Right arm, Left arm, Chest, Abdomen, Front perineal area, Buttocks, Right upper leg, Left upper leg, Right lower leg, Left lower leg, Face   Body parts bathed by helper: Buttocks     Bathing assist Assist Level: Minimal Assistance - Patient > 75%(LH Sponge)     Upper Body Dressing/Undressing Upper body dressing Upper body dressing/undressing activity did not occur (including orthotics): N/A(patient already have shirt on) What is the patient wearing?: Pull over shirt    Upper body assist Assist Level: Set up assist    Lower Body Dressing/Undressing Lower body dressing    Lower body dressing activity did not occur: N/A(patient already have short on) What is the patient wearing?: Pants     Lower body assist Assist for lower body dressing: Minimal Assistance - Patient > 75%     Toileting Toileting    Toileting assist Assist for toileting: Contact Guard/Touching assist Assistive Device Comment: (pt used urinal)   Transfers Chair/bed transfer  Transfers assist     Chair/bed transfer assist level: Contact Guard/Touching assist     Locomotion Ambulation   Ambulation assist      Assist level: Contact Guard/Touching assist Assistive device: Walker-rolling Max distance: 100   Walk 10 feet activity   Assist     Assist  level: Contact Guard/Touching assist Assistive device: Walker-rolling   Walk 50 feet activity   Assist Walk 50 feet with 2 turns activity did not occur: Safety/medical concerns(Fatigue, high pain levels)  Assist level: Contact Guard/Touching assist Assistive device: Walker-rolling    Walk 150 feet activity   Assist Walk 150 feet activity did not occur: Safety/medical concerns(fatigue, high pain  levels)  Assist level: Contact Guard/Touching assist Assistive device: Walker-rolling    Walk 10 feet on uneven surface  activity   Assist Walk 10 feet on uneven surfaces activity did not occur: Safety/medical concerns(fatigue, high pain levels)         Wheelchair     Assist Will patient use wheelchair at discharge?: No Type of Wheelchair: Manual    Wheelchair assist level: Supervision/Verbal cueing Max wheelchair distance: 200'    Wheelchair 50 feet with 2 turns activity    Assist        Assist Level: Supervision/Verbal cueing   Wheelchair 150 feet activity     Assist      Assist Level: Supervision/Verbal cueing   Blood pressure 116/65, pulse 72, temperature 98.8 F (37.1 C), temperature source Oral, resp. rate 18, weight (!) 156.6 kg, SpO2 97 %.  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 Status post multiple I&D's as well as wound VAC changes. Hemovac drain removed 08/10/2019   Continue CIR therapies 2. Antithrombotics: -DVT/anticoagulation:Xarelto for right posterior tibial vein DVT identified on Dopplers 08/01/2019.  Monitor for increasing bleeding. -antiplatelet therapy: N/A 3. Pain Management:OxyContin 40 mg every 12 hours, Neurontin 300 mg 3 times daily, Robaxin 750 mg 3 times daily, oxycodone as needed  -10/22 increase robaxin to 1000mg  qid 4. Mood:Provide emotional support -antipsychotic agents: N/A 5. Neuropsych: This patientiscapable of making decisions on hisown behalf. 6. Skin/Wound Care:Compressive wrap and dressing changesat least daily-discussed with nursing. 7. Fluids/Electrolytes/Nutrition:Routine in and outs 8. ID/MSSA bacteremia/disseminated infection. Continue IV Ancef through 08/27/2019  -add nystatin for thrush 9. Acute blood  loss anemia. Continue iron supplement  Hemoglobin 9.1 on 10/16    Hb 7.7---->7.4 10/21 and 10/22---recheck again tomorrow  Wound drainage improved 10. Diabetes mellitus with hyperglycemia. Latest hemoglobin A1c 12.6. NovoLog 8 units 3 times daily with meals, insulin NPH 28 units twice daily.  -check CBG's ac/hs    CBG (last 3)  Recent Labs    08/22/19 2011 08/23/19 0624 08/23/19 0854  GLUCAP 135* 99 140*   -10/22 sugars improving---no changes 11. Hypertension. Hydralazine 25 mg twice daily. Monitor with increased mobility  Controlled on 10/21 12. Morbid obesity. BMI 40-49.9. Dietary follow-up 13. Drug-induced constipation. Laxative assistance  Liquid by 10/20 14.  Hyponatremia  Sodium 132 on 10/16   10/19- Na 136  LOS: 7 days A FACE TO FACE EVALUATION WAS PERFORMED  Meredith Staggers 08/23/2019, 9:33 AM

## 2019-08-24 ENCOUNTER — Inpatient Hospital Stay (HOSPITAL_COMMUNITY): Payer: Self-pay

## 2019-08-24 ENCOUNTER — Inpatient Hospital Stay (HOSPITAL_COMMUNITY): Payer: Self-pay | Admitting: Physical Therapy

## 2019-08-24 ENCOUNTER — Inpatient Hospital Stay (HOSPITAL_COMMUNITY): Payer: Self-pay | Admitting: Occupational Therapy

## 2019-08-24 LAB — CBC
HCT: 25.6 % — ABNORMAL LOW (ref 39.0–52.0)
Hemoglobin: 7.6 g/dL — ABNORMAL LOW (ref 13.0–17.0)
MCH: 27.7 pg (ref 26.0–34.0)
MCHC: 29.7 g/dL — ABNORMAL LOW (ref 30.0–36.0)
MCV: 93.4 fL (ref 80.0–100.0)
Platelets: 250 10*3/uL (ref 150–400)
RBC: 2.74 MIL/uL — ABNORMAL LOW (ref 4.22–5.81)
RDW: 14.8 % (ref 11.5–15.5)
WBC: 4.9 10*3/uL (ref 4.0–10.5)
nRBC: 0 % (ref 0.0–0.2)

## 2019-08-24 LAB — GLUCOSE, CAPILLARY
Glucose-Capillary: 131 mg/dL — ABNORMAL HIGH (ref 70–99)
Glucose-Capillary: 96 mg/dL (ref 70–99)
Glucose-Capillary: 101 mg/dL — ABNORMAL HIGH (ref 70–99)
Glucose-Capillary: 107 mg/dL — ABNORMAL HIGH (ref 70–99)

## 2019-08-24 NOTE — Progress Notes (Signed)
Ortho Trauma Note  Still draining but has slowed down. Remaining sutures removed today. Pain getting somewhat better. Moving leg much better. Working hard with therapy.  Continue dressing changes. Will continue to follow remotely.  Bradley Cox A. [facility] ?[phone]? (phone)

## 2019-08-24 NOTE — Progress Notes (Signed)
Occupational Therapy Session Note  Patient Details  Name: Bradley Cox MRN: [id number] Date of Birth: 01-Oct-1971  Today's Date: 08/24/2019 OT Individual Time: 0945-1100 OT Individual Time Calculation (min): 75 min    Short Term Goals: Week 1:  OT Short Term Goal 1 (Week 1): patient will complete functional transfers and short distance ambulation with CS OT Short Term Goal 1 - Progress (Week 1): Met OT Short Term Goal 2 (Week 1): patient will complete LB dressing with mod A using ADs as needed OT Short Term Goal 2 - Progress (Week 1): Met  Skilled Therapeutic Interventions/Progress Updates:    1:1. Pt recevied in bed with 5/6 pain but declining intervention at this time. Pt completes ambulation with RW to sink with S. Pt bathes and dresses UB in standing with S-CGA for threading shirt overhead and challenge standing tolerance. Pt completes ambulation to dayroom with RW and VC for relaxing traps away from ears and looking ahead during functional mobility. Pt completes reporting tightness in cervical muscles d/t pushing on RW. OT provides pt with cervical stretch handout with pictures and provides cuing for techniques to each stretch. Pt able ot perform all 6 stretches to competence and OT returns handout to room. Pt given RW bag and reacher bag and educated on energy conservation techniques as well as adaptive strategies for IADLs. Pt able to load washer, dryer and fold laundry in standing without reacher use except  To retrieve item off floor. Pt reporting need to toilet and returns to room in w/c for urgency. Pt transfers onto toilet with RW and voids bowel on toilet with S overall. Exited session with pt seated EOB with exit alarm on and call light in reach  Therapy Documentation Precautions:  Precautions Precautions: Fall Precaution Comments: Wound on R posterior thigh, sensitive to touch/movement of RLE Restrictions Weight Bearing Restrictions: No RLE Weight Bearing: Weight bearing as  tolerated General:   Vital Signs:   Pain:   ADL: ADL Eating: Set up Where Assessed-Eating: Bed level Grooming: Contact guard Where Assessed-Grooming: Standing at sink Upper Body Bathing: Setup Where Assessed-Upper Body Bathing: Sitting at sink Lower Body Bathing: Maximal assistance Where Assessed-Lower Body Bathing: Bed level Upper Body Dressing: Setup Where Assessed-Upper Body Dressing: Wheelchair Lower Body Dressing: Maximal assistance Where Assessed-Lower Body Dressing: Wheelchair Toileting: Maximal assistance Where Assessed-Toileting: Glass blower/designer: Psychiatric nurse Method: Arts development officer: Bedside commode, Grab bars ADL Comments: not cleared to shower due to wounds Vision   Perception    Praxis   Exercises:   Other Treatments:     Therapy/Group: Individual Therapy  Tonny Branch 08/24/2019, 10:54 AM

## 2019-08-24 NOTE — Progress Notes (Signed)
Occupational Therapy Session Note  Patient Details  Name: Bradley Cox MRN: [id number] Date of Birth: 01-03-1971  Today's Date: 08/24/2019 OT Individual Time: 1400-1500 OT Individual Time Calculation (min): 60 min    Short Term Goals: Week 2:  OT Short Term Goal 1 (Week 2): STG=LTG due to LOS  Skilled Therapeutic Interventions/Progress Updates:    Pt seen for OT session focusing on functional mobility, and LE strengthening and stretching. Pt in supine upon arrival, agreeable to tx session. Reports being pre-medicated prior to tx session and agreeable to session without intervention.  He ambulated throughout session with supervision using RW.  In therapy gym, completed standing table top task while standing on foam wedge for calf stretch, pt tolerating ~3 minutes of prolonged stretch. Pt then instructed in self-stretching of calf using bed sheet, tolerated self-stretching well. Supine on mat, completed x1 set of 10 of the following:   Hip ABduction R/L  Heel Slides R/L  "clam shell" R/L Pt ambulated back to room then completed standing toileting task with distant supervision. Hand hygiene in standing with supervision. Pt left sitting EOB with all needs in reach and mother present, RN entering room.   Therapy Documentation Precautions:  Precautions Precautions: Fall Precaution Comments: Wound on R posterior thigh, sensitive to touch/movement of RLE Restrictions Weight Bearing Restrictions: (P) No RLE Weight Bearing: Weight bearing as tolerated   Therapy/Group: Individual Therapy  Ladaysha Soutar L 08/24/2019, 7:18 AM

## 2019-08-24 NOTE — Progress Notes (Signed)
Physical Therapy Weekly Progress Note  Patient Details  Name: Bradley Cox MRN: [id number] Date of Birth: Dec 04, 1970  Beginning of progress report period: August 17, 2019 End of progress report period: August 24, 2019  Today's Date: 08/24/2019 PT Individual Time: 0800-0900 PT Individual Time Calculation (min): 60 min   Patient has met 3 of 3 short term goals.  Pt is at Supervision for bed mobility, Supervision to CGA for transfer with RW, Supervision for gait with RW 50-200 ft depending on fatigue level and pain level. Pt is CGA for navigation of two stairs with RW. Pt continues to make good progress towards d/c goals.  Patient continues to demonstrate the following deficits muscle weakness and decreased standing balance, decreased postural control and decreased balance strategies and therefore will continue to benefit from skilled PT intervention to increase functional independence with mobility.  Patient progressing toward long term goals..  Continue plan of care.  PT Short Term Goals Week 1:  PT Short Term Goal 1 (Week 1): Pt will perfrom bed mobility with supervision PT Short Term Goal 1 - Progress (Week 1): Met PT Short Term Goal 2 (Week 1): Pt will perfrom car transfer with RW min A PT Short Term Goal 2 - Progress (Week 1): Met PT Short Term Goal 3 (Week 1): Pt will ambulate 36f with RW min A PT Short Term Goal 3 - Progress (Week 1): Met Week 2:  PT Short Term Goal 1 (Week 2): =LTG due to ELOS  Skilled Therapeutic Interventions/Progress Updates:    Pt received supine in bed, agreeable to PT session. Pt reports pain in RLE this AM, not rated. Bed mobility Supervision. Sit to stand with CGA to RW. Ambulation to bathroom with RW and Supervision. Toilet transfer with CGA and RW. Pt is setup A for pericare and clothing management. Manual w/c propulsion x 150 ft with use of BUE and Supervision, needs assist for management of w/c parts. Standing RLE stretches/therex: hip flex, hip  abd, hip ext x 10 reps each; hip pendulums x 10 reps each direction. Ascend/descend 2 stairs with RW and CGA backwards to simulate home environment. Therapy recommending assist from a 2nd person to stabilize RW while pt navigates stairs. Per pt his dad can provide this level of assist. Car transfer with Supervision with cues for transfer technique at simulation height for [facility]. Stand pivot transfer w/c to bed with RW and Supervision. Sit to supine Supervision. Provided hot pack for L hip pain management at end of session. Pt left supine in bed with needs in reach.  Therapy Documentation Precautions:  Precautions Precautions: Fall Precaution Comments: Wound on R posterior thigh, sensitive to touch/movement of RLE Restrictions Weight Bearing Restrictions: No RLE Weight Bearing: Weight bearing as tolerated   Therapy/Group: Individual Therapy   TExcell Seltzer PT, DPT  08/24/2019, 12:09 PM

## 2019-08-24 NOTE — Progress Notes (Signed)
[place] PHYSICAL MEDICINE & REHABILITATION PROGRESS NOTE   Subjective/Complaints: Feeling better today. Able to bear weight more easily on his right leg. Was anxious about sutures coming out yesterday---only half came out  ROS: Patient denies fever, rash, sore throat, blurred vision, nausea, vomiting, diarrhea, cough, shortness of breath or chest pain,   headache, or mood change.    Objective:   No results found. Recent Labs    08/22/19 0437 08/23/19 0450  WBC 5.3 5.4  HGB 7.4* 7.4*  HCT 24.5* 25.7*  PLT 224 251   Recent Labs    08/23/19 0450  NA 135  K 3.8  CL 98  CO2 27  GLUCOSE 120*  BUN 5*  CREATININE 0.44*  CALCIUM 8.6*    Intake/Output Summary (Last 24 hours) at 08/24/2019 0928 Last data filed at 08/24/2019 0853 Gross per 24 hour  Intake 368 ml  Output 700 ml  Net -332 ml     Physical Exam: Vital Signs Blood pressure 117/68, pulse 87, temperature 98.1 F (36.7 C), temperature source Oral, resp. rate 19, weight (!) 156.6 kg, SpO2 94 %. Constitutional: No distress . Vital signs reviewed. HEENT: EOMI, oral membranes moist, decreased thrush Neck: supple Cardiovascular: RRR without murmur. No JVD    Respiratory: CTA Bilaterally without wheezes or rales. Normal effort    GI: BS +, non-tender, non-distended  Psych: full affect Musc: Right lower extremity edema and tenderness, bearing weight nicely today.  Neurological: Alert Follows full commands Awareness of deficits.  Motor: Bilateral UE 5/5.  RLE 2/5 HF, 3/5 KE and 4/5 ADF/PF.  LLE: 4/5 prox to 5/5 distally.  Skin: Dressed, in therapy--did not see   Assessment/Plan: 1. 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  Physiatrist is  providing close team supervision and 24 hour management of active medical problems listed below.  Physiatrist and rehab team continue to assess barriers to discharge/monitor patient progress toward functional and medical goals  Care Tool:  Bathing    Body parts bathed by patient: Right arm, Left arm, Chest, Abdomen, Front perineal area, Buttocks, Right upper leg, Left upper leg, Right lower leg, Left lower leg, Face   Body parts bathed by helper: Buttocks     Bathing assist Assist Level: Minimal Assistance - Patient > 75%(LH Sponge)     Upper Body Dressing/Undressing Upper body dressing Upper body dressing/undressing activity did not occur (including orthotics): N/A(patient already have shirt on) What is the patient wearing?: Pull over shirt    Upper body assist Assist Level: Set up assist    Lower Body Dressing/Undressing Lower body dressing    Lower body dressing activity did not occur: N/A(patient already have short on) What is the patient wearing?: Pants     Lower body assist Assist for lower body dressing: Minimal Assistance - Patient > 75%     Toileting Toileting    Toileting assist Assist for toileting: Contact Guard/Touching assist Assistive Device Comment: (pt used urinal)   Transfers Chair/bed transfer  Transfers assist     Chair/bed transfer assist level: Contact Guard/Touching assist     Locomotion Ambulation   Ambulation assist      Assist level: Contact Guard/Touching assist Assistive device: Walker-rolling Max distance: 100   Walk 10 feet activity   Assist  Assist level: Contact Guard/Touching assist Assistive device: Walker-rolling   Walk 50 feet activity   Assist Walk 50 feet with 2 turns activity did not occur: Safety/medical concerns(Fatigue, high pain levels)  Assist level: Contact Guard/Touching assist Assistive device: Walker-rolling    Walk 150 feet activity   Assist Walk 150 feet activity did not occur:  Safety/medical concerns(fatigue, high pain levels)  Assist level: Contact Guard/Touching assist Assistive device: Walker-rolling    Walk 10 feet on uneven surface  activity   Assist Walk 10 feet on uneven surfaces activity did not occur: Safety/medical concerns(fatigue, high pain levels)         Wheelchair     Assist Will patient use wheelchair at discharge?: No Type of Wheelchair: Manual    Wheelchair assist level: Supervision/Verbal cueing Max wheelchair distance: 200'    Wheelchair 50 feet with 2 turns activity    Assist        Assist Level: Supervision/Verbal cueing   Wheelchair 150 feet activity     Assist      Assist Level: Supervision/Verbal cueing   Blood pressure 117/68, pulse 87, temperature 98.1 F (36.7 C), temperature source Oral, resp. rate 19, weight (!) 156.6 kg, SpO2 94 %.  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 Status post multiple I&D's as well as wound VAC changes. Hemovac drain removed 08/10/2019   Continue CIR therapies PT, OT 2. Antithrombotics: -DVT/anticoagulation:Xarelto for right posterior tibial vein DVT identified on Dopplers 08/01/2019.  Monitor for increasing bleeding. -antiplatelet therapy: N/A 3. Pain Management:OxyContin 40 mg every 12 hours, Neurontin 300 mg 3 times daily,  oxycodone as needed  -10/22 increased robaxin to 1000mg  qid  -10/23 pain improved  4. Mood:Provide emotional support -antipsychotic agents: N/A 5. Neuropsych: This patientiscapable of making decisions on hisown behalf. 6. Skin/Wound Care:Compressive wrap and dressing changesat least daily-ortho following  -remaining sutures out today. 7. Fluids/Electrolytes/Nutrition:Routine in and outs 8. ID/MSSA bacteremia/disseminated infection. Continue IV  Ancef through 08/27/2019  -  nystatin for thrush--improving 9. Acute blood loss anemia. Continue iron supplement  Hemoglobin 9.1 on 10/16    Hb 7.7---->7.4 10/21 and 10/22---recheck again today   10. Diabetes mellitus with hyperglycemia. Latest hemoglobin A1c 12.6. NovoLog 8 units 3 times daily with meals, insulin NPH 28 units twice daily.  -check CBG's ac/hs    CBG (last 3)  Recent Labs    08/23/19 1804 08/23/19 2126 08/24/19 0624  GLUCAP 114* 154* 131*   -10/23 sugars improving---no changes today 11. Hypertension. Hydralazine 25 mg twice daily. Monitor with increased mobility  Controlled on 10/23 12. Morbid obesity. BMI 40-49.9. Dietary follow-up 13. Drug-induced constipation. Laxative assistance  bm 10/22 14.  Hyponatremia  Sodium 132 on 10/16   10/19- Na 136  -recheck monday LOS: 8 days A FACE TO FACE EVALUATION WAS PERFORMED  Meredith Staggers 08/24/2019, 9:28 AM

## 2019-08-25 ENCOUNTER — Inpatient Hospital Stay (HOSPITAL_COMMUNITY): Payer: Self-pay

## 2019-08-25 LAB — GLUCOSE, CAPILLARY
Glucose-Capillary: 140 mg/dL — ABNORMAL HIGH (ref 70–99)
Glucose-Capillary: 119 mg/dL — ABNORMAL HIGH (ref 70–99)
Glucose-Capillary: 123 mg/dL — ABNORMAL HIGH (ref 70–99)
Glucose-Capillary: 85 mg/dL (ref 70–99)
Glucose-Capillary: 113 mg/dL — ABNORMAL HIGH (ref 70–99)

## 2019-08-25 NOTE — Progress Notes (Signed)
Physical Therapy Session Note  Patient Details  Name: Bradley Cox MRN: [id number] Date of Birth: 1970-12-15  Today's Date: 08/25/2019 PT Individual Time: 0800-0855 PT Individual Time Calculation (min): 55 min   Short Term Goals: Week 2:  PT Short Term Goal 1 (Week 2): =LTG due to ELOS  Skilled Therapeutic Interventions/Progress Updates:    Pt reports that he adjusted the bed earlier and it made a thud and a bar was felt under his back/hip area. Pt reporting needing to use the bathroom. Functional bed mobility with supervision using bedrail for support. Seated calf stretch prior to OOB to "loosen up". Performed sit <> stand from slightly elevated bed with supervision with RW and functional gait within room with RW at supervision level. Toilet transfers to [facility] over toilet in bathroom with supervision and dynamic standing balance for clothing management and hygiene (+ BM and void). D/c planning and goals discussed during rest breaks and pt denies concerns with upcoming d/c this week stating his main goal is to work on mobility and getting stronger.Functional gait down to therapy gym x 100' with supervision to [place] with cues for posture and decreased reliance on UE's. Pt reporting pain on outside of R hip increasing from the bed incident. Attempted to perform sidelying (on L) stretch with decreased tolerance. Performed bed mobility on flat surface with supervision. Attempted standing balance and RLE strengthening for toe tap to 6" step with decreased clearance x 10 reps and then reports increased pain. Performed CGA transfer to w/c and returned to room total assist. RN notified and PT applied ice pack to R hip area.   Therapy Documentation Precautions:  Precautions Precautions: Fall Precaution Comments: Wound on R posterior thigh, sensitive to touch/movement of RLE Restrictions Weight Bearing Restrictions: No RLE Weight Bearing: Weight bearing as tolerated Pain: Pain Assessment Pain Scale:  0-10 Pain Score: 7  Pain Type: Surgical pain;Acute pain Pain Location: Hip Pain Orientation: Right Pain Descriptors / Indicators: Aching Pain Frequency: Intermittent Patients Stated Pain Goal: 2 Pain Intervention(s): Medication (See eMAR)   Therapy/Group: Individual Therapy  [name], PT, DPT, CBIS  08/25/2019, 8:57 AM

## 2019-08-25 NOTE — Plan of Care (Signed)
  Problem: Consults Goal: RH GENERAL PATIENT EDUCATION Description: See Patient Education module for education specifics. Outcome: Progressing Goal: Skin Care Protocol Initiated - if Braden Score 18 or less Description: If consults are not indicated, leave blank or document N/A Outcome: Progressing Goal: Nutrition Consult-if indicated Outcome: Progressing Goal: Diabetes Guidelines if Diabetic/Glucose > 140 Description: If diabetic or lab glucose is > 140 mg/dl - Initiate Diabetes/Hyperglycemia Guidelines & Document Interventions  Outcome: Progressing   Problem: RH BOWEL ELIMINATION Goal: RH STG MANAGE BOWEL WITH ASSISTANCE Description: STG Manage Bowel with Assistance. Outcome: Progressing Goal: RH STG MANAGE BOWEL W/MEDICATION W/ASSISTANCE Description: STG Manage Bowel with Medication with Assistance. Outcome: Progressing   Problem: RH BLADDER ELIMINATION Goal: RH STG MANAGE BLADDER WITH ASSISTANCE Description: STG Manage Bladder With Assistance Outcome: Progressing Goal: RH STG MANAGE BLADDER WITH EQUIPMENT WITH ASSISTANCE Description: STG Manage Bladder With Equipment With Assistance Outcome: Progressing   Problem: RH SKIN INTEGRITY Goal: RH STG SKIN FREE OF INFECTION/BREAKDOWN Outcome: Progressing Goal: RH STG MAINTAIN SKIN INTEGRITY WITH ASSISTANCE Description: STG Maintain Skin Integrity With Assistance. Outcome: Progressing   Problem: RH SAFETY Goal: RH STG ADHERE TO SAFETY PRECAUTIONS W/ASSISTANCE/DEVICE Description: STG Adhere to Safety Precautions With Assistance/Device. Outcome: Progressing   Problem: RH PAIN MANAGEMENT Goal: RH STG PAIN MANAGED AT OR BELOW PT'S PAIN GOAL Outcome: Progressing   Problem: RH KNOWLEDGE DEFICIT GENERAL Goal: RH STG INCREASE KNOWLEDGE OF SELF CARE AFTER HOSPITALIZATION Outcome: Progressing

## 2019-08-25 NOTE — Progress Notes (Signed)
[place] PHYSICAL MEDICINE & REHABILITATION PROGRESS NOTE   Subjective/Complaints: Pain improving. Tolerated sutures out yesterday.   ROS: Patient denies fever, rash, sore throat, blurred vision, nausea, vomiting, diarrhea, cough, shortness of breath or chest pain,  headache, or mood change.    Objective:   No results found. Recent Labs    08/23/19 0450 08/24/19 2016  WBC 5.4 4.9  HGB 7.4* 7.6*  HCT 25.7* 25.6*  PLT 251 250   Recent Labs    08/23/19 0450  NA 135  K 3.8  CL 98  CO2 27  GLUCOSE 120*  BUN 5*  CREATININE 0.44*  CALCIUM 8.6*    Intake/Output Summary (Last 24 hours) at 08/25/2019 0814 Last data filed at 08/24/2019 1845 Gross per 24 hour  Intake 684 ml  Output -  Net 684 ml     Physical Exam: Vital Signs Blood pressure 127/63, pulse 93, temperature 98.9 F (37.2 C), temperature source Oral, resp. rate 17, weight (!) 156.6 kg, SpO2 97 %. Constitutional: No distress . Vital signs reviewed. HEENT: EOMI, oral membranes moist Neck: supple Cardiovascular: RRR without murmur. No JVD    Respiratory: CTA Bilaterally without wheezes or rales. Normal effort    GI: BS +, non-tender, non-distended  Psych: full affect Musc: Right lower extremity edema and tenderness, improving Neurological: Alert Follows full commands Awareness of deficits.  Motor: Bilateral UE 5/5.  RLE 2/5 HF, 3/5 KE and 4/5 ADF/PF.  LLE: 4/5 prox to 5/5 distally.  Skin: Wounds with sl s/s drainage   Assessment/Plan: 1. 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  Physiatrist is providing close team supervision and 24 hour management of active medical problems listed below.  Physiatrist and rehab team continue to  assess barriers to discharge/monitor patient progress toward functional and medical goals  Care Tool:  Bathing    Body parts bathed by patient: Right arm, Left arm, Chest, Abdomen, Front perineal area, Buttocks, Right upper leg, Left upper leg, Right lower leg, Left lower leg, Face   Body parts bathed by helper: Buttocks     Bathing assist Assist Level: Minimal Assistance - Patient > 75%(LH Sponge)     Upper Body Dressing/Undressing Upper body dressing Upper body dressing/undressing activity did not occur (including orthotics): N/A(patient already have shirt on) What is the patient wearing?: Pull over shirt    Upper body assist Assist Level: Set up assist    Lower Body Dressing/Undressing Lower body dressing    Lower body dressing activity did not occur: N/A(patient already have short on) What is the patient wearing?: Pants     Lower body assist Assist for lower body dressing: Minimal Assistance - Patient > 75%     Toileting Toileting    Toileting assist Assist for toileting: Supervision/Verbal cueing Assistive Device Comment: (pt used urinal)   Transfers Chair/bed transfer  Transfers assist     Chair/bed transfer assist level: Contact Guard/Touching assist     Locomotion Ambulation   Ambulation assist      Assist level: Contact Guard/Touching assist Assistive device: Walker-rolling Max distance: 100   Walk 10 feet activity   Assist     Assist level: Contact Guard/Touching assist Assistive device: Walker-rolling   Walk 50 feet activity   Assist Walk 50 feet with 2 turns activity did  not occur: Safety/medical concerns(Fatigue, high pain levels)  Assist level: Contact Guard/Touching assist Assistive device: Walker-rolling    Walk 150 feet activity   Assist Walk 150 feet activity did not occur: Safety/medical concerns(fatigue, high pain levels)  Assist level: Contact Guard/Touching assist Assistive device: Walker-rolling    Walk 10 feet  on uneven surface  activity   Assist Walk 10 feet on uneven surfaces activity did not occur: Safety/medical concerns(fatigue, high pain levels)         Wheelchair     Assist Will patient use wheelchair at discharge?: No Type of Wheelchair: Manual    Wheelchair assist level: Supervision/Verbal cueing Max wheelchair distance: 150'    Wheelchair 50 feet with 2 turns activity    Assist        Assist Level: Supervision/Verbal cueing   Wheelchair 150 feet activity     Assist      Assist Level: Supervision/Verbal cueing   Blood pressure 127/63, pulse 93, temperature 98.9 F (37.2 C), temperature source Oral, resp. rate 17, weight (!) 156.6 kg, SpO2 97 %.  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 Status post multiple I&D's as well as wound VAC changes. Hemovac drain removed 08/10/2019   Continue CIR therapies PT, OT 2. Antithrombotics: -DVT/anticoagulation:Xarelto for right posterior tibial vein DVT identified on Dopplers 08/01/2019.  Monitor for  bleeding. -antiplatelet therapy: N/A 3. Pain Management:OxyContin 40 mg every 12 hours, Neurontin 300 mg 3 times daily,  oxycodone as needed  -10/22 increased robaxin to 1000mg  qid  -10/23 pain improved  4. Mood:Provide emotional support -antipsychotic agents: N/A 5. Neuropsych: This patientiscapable of making decisions on hisown behalf. 6. Skin/Wound Care:Compressive wrap and dressing changesat least daily-ortho following  -remaining sutures out   7. Fluids/Electrolytes/Nutrition:Routine in and outs 8. ID/MSSA bacteremia/disseminated infection. Continue IV Ancef through 08/27/2019  -  nystatin for thrush--improved---continue for 5 days total then stop 9. Acute blood loss anemia. Continue iron supplement  Hemoglobin  9.1 on 10/16    Hb 7.7---->7.4 10/21 and 10/22---> 7.6 10/23  -recheck monday   10. Diabetes mellitus with hyperglycemia. Latest hemoglobin A1c 12.6. NovoLog 8 units 3 times daily with meals, insulin NPH 28 units twice daily.  -check CBG's ac/hs    CBG (last 3)  Recent Labs    08/24/19 2115 08/25/19 0128 08/25/19 0609  GLUCAP 107* 140* 119*   -10/23 sugars improved---no changes today 11. Hypertension. Hydralazine 25 mg twice daily. Monitor with increased mobility  Controlled on 10/24 12. Morbid obesity. BMI 40-49.9. Dietary follow-up 13. Drug-induced constipation. Laxative assistance  Moving bowels 14.  Hyponatremia  Sodium 132 on 10/16   10/19- Na 136  -recheck monday LOS: 9 days A FACE TO FACE EVALUATION WAS PERFORMED  Meredith Staggers 08/25/2019, 8:14 AM

## 2019-08-26 ENCOUNTER — Inpatient Hospital Stay (HOSPITAL_COMMUNITY): Payer: Self-pay

## 2019-08-26 LAB — GLUCOSE, CAPILLARY
Glucose-Capillary: 132 mg/dL — ABNORMAL HIGH (ref 70–99)
Glucose-Capillary: 104 mg/dL — ABNORMAL HIGH (ref 70–99)
Glucose-Capillary: 108 mg/dL — ABNORMAL HIGH (ref 70–99)
Glucose-Capillary: 134 mg/dL — ABNORMAL HIGH (ref 70–99)

## 2019-08-26 NOTE — Progress Notes (Signed)
Physical Therapy Session Note  Patient Details  Name: Bradley Cox MRN: [id number] Date of Birth: 08/30/1971  Today's Date: 08/26/2019 PT Individual Time: 1102-1200 PT Individual Time Calculation (min): 58 min   Short Term Goals: Week 2:  PT Short Term Goal 1 (Week 2): =LTG due to ELOS  Skilled Therapeutic Interventions/Progress Updates:    Pt supine in bed upon PT arrival, agreeable to therapy tx and reports 7/10 pain in R LE at rest. Pt reports his R leg was not wrapped properly last night, this therapist provided education to pt and his mother on frequency on which LE needs to be re-wrapped for edema control and also educated on wrapping techniques. Therapist doffed ace wraps, RN present to change dressing and then this therapist re-wrapped R LE for edema control and management. Pt performed bed mobility with supervision for dressing change, assist to lift R LE during wrapping. Pt transferred to sitting with supervision. Pt performed sit<>stand with supervision, maintained standing balance with CGA while performing clothing management & using urinal, continent of bladder. Pt ambulated to the gym this session x 110 ft with RW and supervision, cues for decreased reliance on UEs with RW. Pt's mother present during session for family education. Pt ascended/descended 2 steps with RW going up backwards, therapist educating on techniques and how to stabilize RW for the pt, CGA. Pt worked on dynamic standing balance this session without UE support to perform ball toss activity x 1 feet apart, x 1 feet staggered and x 1 feet together, CGA for this. Pt worked on dynamic standing balance to perform x 10 R LE steps in place without UE support, min assist. Pt ambulated back to room x 110 ft with RW and supervision, transferred to bed and to supine with supervision. Heat applied to R hip for pain relief, left in care of his mother with needs in reach.   Therapy Documentation Precautions:   Precautions Precautions: Fall Precaution Comments: Wound on R posterior thigh, sensitive to touch/movement of RLE Restrictions Weight Bearing Restrictions: No RLE Weight Bearing: Weight bearing as tolerated   Pain: Pain Assessment Pain Scale: 0-10 Pain Score: 7  Pain Type: Acute pain;Surgical pain Pain Location: Hip Pain Orientation: Right Pain Descriptors / Indicators: Crushing Pain Frequency: Constant Patients Stated Pain Goal: 3 Pain Intervention(s): Medication (See eMAR);Cold applied;Heat applied;Distraction    Therapy/Group: Individual Therapy  Netta Corrigan, PT, DPT, CSRS 08/26/2019, 11:17 AM

## 2019-08-26 NOTE — Progress Notes (Signed)
[place] PHYSICAL MEDICINE & REHABILITATION PROGRESS NOTE   Subjective/Complaints: No new issues. Pain controlled.   ROS: Patient denies fever, rash, sore throat, blurred vision, nausea, vomiting, diarrhea, cough, shortness of breath or chest pain,  headache, or mood change.     Objective:   No results found. Recent Labs    08/24/19 2016  WBC 4.9  HGB 7.6*  HCT 25.6*  PLT 250   No results for input(s): NA, K, CL, CO2, GLUCOSE, BUN, CREATININE, CALCIUM in the last 72 hours.  Intake/Output Summary (Last 24 hours) at 08/26/2019 0757 Last data filed at 08/26/2019 [id number] Gross per 24 hour  Intake 720 ml  Output 1025 ml  Net -305 ml     Physical Exam: Vital Signs Blood pressure 130/81, pulse 96, temperature 98.8 F (37.1 C), temperature source Oral, resp. rate 18, weight (!) 156.6 kg, SpO2 94 %. Constitutional: No distress . Vital signs reviewed. HEENT: EOMI, oral membranes moist Neck: supple Cardiovascular: RRR without murmur. No JVD    Respiratory: CTA Bilaterally without wheezes or rales. Normal effort    GI: BS +, non-tender, non-distended  Psych: full affect Musc: Right lower extremity edema and tenderness, improving Neurological: Alert Follows full commands Awareness of deficits.  Motor: Bilateral UE 5/5.  RLE 2/5 HF, 3/5 KE and 4/5 ADF/PF.  LLE: 4/5 prox to 5/5 distally.  Skin: Wounds with ongoing drainage. Popliteal wound literally dripping serous>sanguinous fluid when I removed dressing. Incisions intact   Assessment/Plan: 1. 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  Physiatrist is providing close team supervision and 24 hour management of active medical problems listed below.  Physiatrist  and rehab team continue to assess barriers to discharge/monitor patient progress toward functional and medical goals  Care Tool:  Bathing    Body parts bathed by patient: Right arm, Left arm, Chest, Abdomen, Front perineal area, Buttocks, Right upper leg, Left upper leg, Right lower leg, Left lower leg, Face   Body parts bathed by helper: Buttocks     Bathing assist Assist Level: Minimal Assistance - Patient > 75%(LH Sponge)     Upper Body Dressing/Undressing Upper body dressing Upper body dressing/undressing activity did not occur (including orthotics): N/A(patient already have shirt on) What is the patient wearing?: Pull over shirt    Upper body assist Assist Level: Set up assist    Lower Body Dressing/Undressing Lower body dressing    Lower body dressing activity did not occur: N/A(patient already have short on) What is the patient wearing?: Pants     Lower body assist Assist for lower body dressing: Minimal Assistance - Patient > 75%     Toileting Toileting    Toileting assist Assist for toileting: Supervision/Verbal cueing Assistive Device Comment: (pt used urinal)   Transfers Chair/bed transfer  Transfers assist     Chair/bed transfer assist level: Supervision/Verbal cueing     Locomotion Ambulation   Ambulation assist      Assist level: Contact Guard/Touching assist Assistive device: Walker-rolling Max distance: 100   Walk 10 feet activity   Assist     Assist level: Contact Guard/Touching assist Assistive device: Walker-rolling   Walk 50 feet activity   Assist Walk 50 feet with 2 turns activity did not occur: Safety/medical concerns(Fatigue, high pain levels)  Assist level: Contact  Guard/Touching assist Assistive device: Walker-rolling    Walk 150 feet activity   Assist Walk 150 feet activity did not occur: Safety/medical concerns(fatigue, high pain levels)  Assist level: Contact Guard/Touching assist Assistive device:  Walker-rolling    Walk 10 feet on uneven surface  activity   Assist Walk 10 feet on uneven surfaces activity did not occur: Safety/medical concerns(fatigue, high pain levels)         Wheelchair     Assist Will patient use wheelchair at discharge?: No Type of Wheelchair: Manual    Wheelchair assist level: Supervision/Verbal cueing Max wheelchair distance: 150'    Wheelchair 50 feet with 2 turns activity    Assist        Assist Level: Supervision/Verbal cueing   Wheelchair 150 feet activity     Assist      Assist Level: Supervision/Verbal cueing   Blood pressure 130/81, pulse 96, temperature 98.8 F (37.1 C), temperature source Oral, resp. rate 18, weight (!) 156.6 kg, SpO2 94 %.  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 Status post multiple I&D's as well as wound VAC changes. Hemovac drain removed 08/10/2019   Continue CIR therapies PT, OT 2. Antithrombotics: -DVT/anticoagulation:Xarelto for right posterior tibial vein DVT identified on Dopplers 08/01/2019.  Monitor for  bleeding. -antiplatelet therapy: N/A 3. Pain Management:OxyContin 40 mg every 12 hours, Neurontin 300 mg 3 times daily,  oxycodone as needed  -10/22 increased robaxin to 1000mg  qid  -10/25 pain improved  4. Mood:Provide emotional support -antipsychotic agents: N/A 5. Neuropsych: This patientiscapable of making decisions on hisown behalf. 6. Skin/Wound Care:Compressive wrap and dressing changesat least daily-ortho following  -remaining sutures out   7. Fluids/Electrolytes/Nutrition:Routine in and outs 8. ID/MSSA bacteremia/disseminated infection. Continue IV Ancef through 08/27/2019  -  nystatin for thrush--improved---continue for 5 days total (10/22) then stop 9. Acute blood loss  anemia. Continue iron supplement  Hemoglobin 9.1 on 10/16    Hb 7.7---->7.4 10/21 and 10/22---> 7.6 10/23  -recheck monday   10. Diabetes mellitus with hyperglycemia. Latest hemoglobin A1c 12.6. NovoLog 8 units 3 times daily with meals, insulin NPH 28 units twice daily.  -check CBG's ac/hs    CBG (last 3)  Recent Labs    08/25/19 1710 08/25/19 2056 08/26/19 0618  GLUCAP 85 113* 132*   -10/25 sugars improved---no changes today 11. Hypertension. Hydralazine 25 mg twice daily. Monitor with increased mobility  Controlled on 10/25 12. Morbid obesity. BMI 40-49.9. Dietary follow-up 13. Drug-induced constipation. Laxative assistance  Moving bowels 14.  Hyponatremia  Sodium 132 on 10/16   10/19- Na 136  -recheck monday LOS: 10 days A FACE TO FACE EVALUATION WAS PERFORMED  Meredith Staggers 08/26/2019, 7:57 AM

## 2019-08-26 NOTE — Progress Notes (Signed)
Occupational Therapy Session Note  Patient Details  Name: Bradley Cox MRN: [id number] Date of Birth: May 25, 1971  Today's Date: 08/26/2019 OT Individual Time: 1505-1600 OT Individual Time Calculation (min): 55 min    Short Term Goals: Week 1:  OT Short Term Goal 1 (Week 1): patient will complete functional transfers and short distance ambulation with CS OT Short Term Goal 1 - Progress (Week 1): Met OT Short Term Goal 2 (Week 1): patient will complete LB dressing with mod A using ADs as needed OT Short Term Goal 2 - Progress (Week 1): Met  Skilled Therapeutic Interventions/Progress Updates:    1:1. Pt received in bed agreeable to OT with pain in R hip increasing throughout session and RN delivers pain medication. Pt completes ambulatory transfer with S to bathroom to void bowel. Pt completes CM/hygiene in standing with RW and S. Pt stands at sink with close S for ant/post sway while washing body and donning shirt. Pt sits to thread BLE into reacher with set up and stands with S to advance past hips. Pt completes 2x10 therex DB HEP with 5# weights for global strengthening of UEs required for functional mobility and tx. Exited session with pt seated EOB and RN in room.   Therapy Documentation Precautions:  Precautions Precautions: Fall Precaution Comments: Wound on R posterior thigh, sensitive to touch/movement of RLE Restrictions Weight Bearing Restrictions: No RLE Weight Bearing: Weight bearing as tolerated General:   Vital Signs: Therapy Vitals Temp: 97.8 F (36.6 C) Temp Source: Oral Pulse Rate: 87 Resp: 20 BP: 110/67 Patient Position (if appropriate): Lying Oxygen Therapy SpO2: 97 % O2 Device: Room Air Pain: Pain Assessment Pain Scale: 0-10 Pain Score: 7  Pain Type: Acute pain;Surgical pain Pain Location: Hip Pain Orientation: Right Pain Intervention(s): Medication (See eMAR);Cold applied;Heat applied;Distraction Multiple Pain Sites: No ADL: ADL Eating: Set  up Where Assessed-Eating: Bed level Grooming: Contact guard Where Assessed-Grooming: Standing at sink Upper Body Bathing: Setup Where Assessed-Upper Body Bathing: Sitting at sink Lower Body Bathing: Maximal assistance Where Assessed-Lower Body Bathing: Bed level Upper Body Dressing: Setup Where Assessed-Upper Body Dressing: Wheelchair Lower Body Dressing: Maximal assistance Where Assessed-Lower Body Dressing: Wheelchair Toileting: Maximal assistance Where Assessed-Toileting: Glass blower/designer: Psychiatric nurse Method: Arts development officer: Bedside commode, Grab bars ADL Comments: not cleared to shower due to wounds Vision   Perception    Praxis   Exercises:   Other Treatments:     Therapy/Group: Individual Therapy  Tonny Branch 08/26/2019, 4:19 PM

## 2019-08-26 NOTE — Plan of Care (Signed)
  Problem: Consults Goal: RH GENERAL PATIENT EDUCATION Description: See Patient Education module for education specifics. Outcome: Progressing Goal: Skin Care Protocol Initiated - if Braden Score 18 or less Description: If consults are not indicated, leave blank or document N/A Outcome: Progressing Goal: Nutrition Consult-if indicated Outcome: Progressing Goal: Diabetes Guidelines if Diabetic/Glucose > 140 Description: If diabetic or lab glucose is > 140 mg/dl - Initiate Diabetes/Hyperglycemia Guidelines & Document Interventions  Outcome: Progressing   Problem: RH BOWEL ELIMINATION Goal: RH STG MANAGE BOWEL WITH ASSISTANCE Description: STG Manage Bowel with Assistance. Outcome: Progressing Goal: RH STG MANAGE BOWEL W/MEDICATION W/ASSISTANCE Description: STG Manage Bowel with Medication with Assistance. Outcome: Progressing   Problem: RH BLADDER ELIMINATION Goal: RH STG MANAGE BLADDER WITH ASSISTANCE Description: STG Manage Bladder With Assistance Outcome: Progressing Goal: RH STG MANAGE BLADDER WITH EQUIPMENT WITH ASSISTANCE Description: STG Manage Bladder With Equipment With Assistance Outcome: Progressing   Problem: RH SKIN INTEGRITY Goal: RH STG SKIN FREE OF INFECTION/BREAKDOWN Outcome: Progressing Goal: RH STG MAINTAIN SKIN INTEGRITY WITH ASSISTANCE Description: STG Maintain Skin Integrity With Assistance. Outcome: Progressing   Problem: RH SAFETY Goal: RH STG ADHERE TO SAFETY PRECAUTIONS W/ASSISTANCE/DEVICE Description: STG Adhere to Safety Precautions With Assistance/Device. Outcome: Progressing   Problem: RH PAIN MANAGEMENT Goal: RH STG PAIN MANAGED AT OR BELOW PT'S PAIN GOAL Outcome: Progressing   Problem: RH KNOWLEDGE DEFICIT GENERAL Goal: RH STG INCREASE KNOWLEDGE OF SELF CARE AFTER HOSPITALIZATION Outcome: Progressing

## 2019-08-27 ENCOUNTER — Inpatient Hospital Stay (HOSPITAL_COMMUNITY): Payer: Self-pay | Admitting: Physical Therapy

## 2019-08-27 ENCOUNTER — Inpatient Hospital Stay (HOSPITAL_COMMUNITY): Payer: Self-pay | Admitting: Occupational Therapy

## 2019-08-27 LAB — COMPREHENSIVE METABOLIC PANEL
ALT: 31 U/L (ref 0–44)
AST: 31 U/L (ref 15–41)
Albumin: 2.2 g/dL — ABNORMAL LOW (ref 3.5–5.0)
Alkaline Phosphatase: 210 U/L — ABNORMAL HIGH (ref 38–126)
Anion gap: 9 (ref 5–15)
BUN: <5 mg/dL — ABNORMAL LOW (ref 6–20)
CO2: 28 mmol/L (ref 22–32)
Calcium: 8.7 mg/dL — ABNORMAL LOW (ref 8.9–10.3)
Chloride: 100 mmol/L (ref 98–111)
Creatinine, Ser: 0.57 mg/dL — ABNORMAL LOW (ref 0.61–1.24)
GFR calc Af Amer: >60 mL/min (ref >60)
GFR calc non Af Amer: >60 mL/min (ref >60)
Glucose, Bld: 146 mg/dL — ABNORMAL HIGH (ref 70–99)
Potassium: 3.7 mmol/L (ref 3.5–5.1)
Sodium: 137 mmol/L (ref 135–145)
Total Bilirubin: <0.1 mg/dL — ABNORMAL LOW (ref 0.3–1.2)
Total Protein: 6.2 g/dL — ABNORMAL LOW (ref 6.5–8.1)

## 2019-08-27 LAB — CBC WITH DIFFERENTIAL/PLATELET
Abs Immature Granulocytes: 0.01 10*3/uL (ref 0.00–0.07)
Basophils Absolute: 0 10*3/uL (ref 0.0–0.1)
Basophils Relative: 1 %
Eosinophils Absolute: 0.3 10*3/uL (ref 0.0–0.5)
Eosinophils Relative: 7 %
HCT: 25.8 % — ABNORMAL LOW (ref 39.0–52.0)
Hemoglobin: 7.5 g/dL — ABNORMAL LOW (ref 13.0–17.0)
Immature Granulocytes: 0 %
Lymphocytes Relative: 38 %
Lymphs Abs: 1.5 10*3/uL (ref 0.7–4.0)
MCH: 27.3 pg (ref 26.0–34.0)
MCHC: 29.1 g/dL — ABNORMAL LOW (ref 30.0–36.0)
MCV: 93.8 fL (ref 80.0–100.0)
Monocytes Absolute: 0.5 10*3/uL (ref 0.1–1.0)
Monocytes Relative: 12 %
Neutro Abs: 1.7 10*3/uL (ref 1.7–7.7)
Neutrophils Relative %: 42 %
Platelets: 237 10*3/uL (ref 150–400)
RBC: 2.75 MIL/uL — ABNORMAL LOW (ref 4.22–5.81)
RDW: 14.6 % (ref 11.5–15.5)
WBC: 4 10*3/uL (ref 4.0–10.5)
nRBC: 0 % (ref 0.0–0.2)

## 2019-08-27 LAB — GLUCOSE, CAPILLARY
Glucose-Capillary: 121 mg/dL — ABNORMAL HIGH (ref 70–99)
Glucose-Capillary: 125 mg/dL — ABNORMAL HIGH (ref 70–99)
Glucose-Capillary: 93 mg/dL (ref 70–99)
Glucose-Capillary: 133 mg/dL — ABNORMAL HIGH (ref 70–99)

## 2019-08-27 LAB — SEDIMENTATION RATE: Sed Rate: 131 mm/hr — ABNORMAL HIGH (ref 0–16)

## 2019-08-27 LAB — C-REACTIVE PROTEIN: CRP: 5.7 mg/dL — ABNORMAL HIGH (ref <1.0)

## 2019-08-27 NOTE — Progress Notes (Signed)
[place] PHYSICAL MEDICINE & REHABILITATION PROGRESS NOTE   Subjective/Complaints: Pt reports thrush is better- doesn't know if gone- hates the Magic mouthwash.  Had to have R thigh redressed at 3am- leak was higher than normal on R thigh- less bleeding overall- more dilute/serosanguinous by the description.  Today is last day if IV ABX.    ROS: Patient denies fever, rash, sore throat, blurred vision, nausea, vomiting, diarrhea, cough, shortness of breath or chest pain,  headache, or mood change.     Objective:   No results found. Recent Labs    08/24/19 2016 08/27/19 0344  WBC 4.9 4.0  HGB 7.6* 7.5*  HCT 25.6* 25.8*  PLT 250 237   Recent Labs    08/27/19 0344  NA 137  K 3.7  CL 100  CO2 28  GLUCOSE 146*  BUN <5*  CREATININE 0.57*  CALCIUM 8.7*    Intake/Output Summary (Last 24 hours) at 08/27/2019 1026 Last data filed at 08/27/2019 0941 Gross per 24 hour  Intake 700 ml  Output 1775 ml  Net -1075 ml     Physical Exam: Vital Signs Blood pressure 113/74, pulse 87, temperature 98.1 F (36.7 C), temperature source Oral, resp. rate 18, weight (!) 156.6 kg, SpO2 97 %. Constitutional: No distress . Vital signs and labs reviewed. Lying in bed; awake, alert, appropriate, NAD HEENT: EOMI, oral membranes moist Neck: supple Cardiovascular: RRR without murmur.  Respiratory: CTA Bilaterally without wheezes or rales. Normal effort    GI: BS +, non-tender, non-distended  Psych: full affect Musc: Right lower extremity edema and tenderness, improving Neurological: Alert Follows full commands Awareness of deficits.  Motor: Bilateral UE 5/5.  RLE 2/5 HF, 3/5 KE and 4/5 ADF/PF.  LLE: 4/5 prox to 5/5 distally.  Skin: Wounds with ongoing drainage. ACE wrap and R upper thigh wound have dressings C/D/I from 3am.   Assessment/Plan: 1. 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  Physiatrist is providing close team supervision and 24 hour management of active medical problems listed below.  Physiatrist and rehab team continue to assess barriers to discharge/monitor patient progress toward functional and medical goals  Care Tool:  Bathing    Body parts bathed by patient: Right arm, Left arm, Chest, Abdomen, Front perineal area, Buttocks, Right upper leg, Left upper leg, Right lower leg, Left lower leg, Face   Body parts bathed by helper: Buttocks     Bathing assist Assist Level: Supervision/Verbal cueing     Upper Body Dressing/Undressing Upper body dressing Upper body dressing/undressing activity did not occur (including orthotics): N/A(patient already have shirt on) What is the patient wearing?: Pull over shirt    Upper body assist Assist Level: Set up assist    Lower Body Dressing/Undressing Lower body dressing    Lower body dressing activity did not occur: N/A(patient already have short on) What is the patient wearing?: Pants     Lower body assist Assist for lower body dressing: Supervision/Verbal cueing     Toileting Toileting    Toileting assist Assist for toileting: Supervision/Verbal cueing Assistive Device Comment: (pt used urinal)   Transfers Chair/bed transfer  Transfers assist     Chair/bed transfer assist level: Supervision/Verbal cueing     Locomotion Ambulation   Ambulation assist  Assist level: Contact Guard/Touching assist Assistive device: Walker-rolling Max distance: 110 ft   Walk 10 feet activity   Assist     Assist level: Contact Guard/Touching assist Assistive device: Walker-rolling   Walk 50 feet activity   Assist Walk 50 feet with 2 turns activity did not occur: Safety/medical concerns(Fatigue, high pain  levels)  Assist level: Contact Guard/Touching assist Assistive device: Walker-rolling    Walk 150 feet activity   Assist Walk 150 feet activity did not occur: Safety/medical concerns(fatigue, high pain levels)  Assist level: Contact Guard/Touching assist Assistive device: Walker-rolling    Walk 10 feet on uneven surface  activity   Assist Walk 10 feet on uneven surfaces activity did not occur: Safety/medical concerns(fatigue, high pain levels)         Wheelchair     Assist Will patient use wheelchair at discharge?: No Type of Wheelchair: Manual    Wheelchair assist level: Supervision/Verbal cueing Max wheelchair distance: 150'    Wheelchair 50 feet with 2 turns activity    Assist        Assist Level: Supervision/Verbal cueing   Wheelchair 150 feet activity     Assist      Assist Level: Supervision/Verbal cueing   Blood pressure 113/74, pulse 87, temperature 98.1 F (36.7 C), temperature source Oral, resp. rate 18, weight (!) 156.6 kg, SpO2 97 %.  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 Status post multiple I&D's as well as wound VAC changes. Hemovac drain removed 08/10/2019   Continue CIR therapies PT, OT 2. Antithrombotics: -DVT/anticoagulation:Xarelto for right posterior tibial vein DVT identified on Dopplers 08/01/2019.  Monitor for  bleeding. -antiplatelet therapy: N/A 3. Pain Management:OxyContin 40 mg every 12 hours, Neurontin 300 mg 3 times daily,  oxycodone as needed  -10/22 increased robaxin to 1000mg  qid  -10/25 pain improved  4. Mood:Provide emotional support -antipsychotic agents: N/A 5. Neuropsych: This patientiscapable of making decisions on hisown behalf. 6. Skin/Wound Care:Compressive wrap and dressing changesat least daily-ortho  following  -remaining sutures out   7. Fluids/Electrolytes/Nutrition:Routine in and outs 8. ID/MSSA bacteremia/disseminated infection. Continue IV Ancef through 08/27/2019  -  nystatin for thrush--improved---continue for 5 days total (10/22) then stop  10/26- today last day of IV ABX. No more ABX of any kind. 9. Acute blood loss anemia. Continue iron supplement  Hemoglobin 9.1 on 10/16    Hb 7.7---->7.4 10/21 and 10/22---> 7.6 10/23  -recheck Monday  10/26- Hb 7.5- stable currently   10. Diabetes mellitus with hyperglycemia. Latest hemoglobin A1c 12.6. NovoLog 8 units 3 times daily with meals, insulin NPH 28 units twice daily.  -check CBG's ac/hs    CBG (last 3)  Recent Labs    08/26/19 1639 08/26/19 2107 08/27/19 0601  GLUCAP 108* 134* 121*   -10/26 sugars improved---no changes today 11. Hypertension. Hydralazine 25 mg twice daily. Monitor with increased mobility  Controlled on 10/25 12. Morbid obesity. BMI 40-49.9. Dietary follow-up 13. Drug-induced constipation. Laxative assistance  Moving bowels 14.  Hyponatremia  Sodium 132 on 10/16   10/19- Na 136  -recheck Monday  10/26- Na 137   LOS: 11 days A FACE TO FACE EVALUATION WAS PERFORMED  Sherill Wegener 08/27/2019, 10:26 AM

## 2019-08-27 NOTE — Progress Notes (Signed)
Occupational Therapy Session Note  Patient Details  Name: Bradley Cox MRN: [id number] Date of Birth: 11/23/1970  Today's Date: 08/27/2019 OT Individual Time: 1100-1200 OT Individual Time Calculation (min): 60 min    Short Term Goals: Week 2:  OT Short Term Goal 1 (Week 2): STG=LTG due to LOS  Skilled Therapeutic Interventions/Progress Updates:    Pt seen for OT session focusing on functional mobility during IADL task, and LE strengthening/endurance.  Pt in supine upon arrival, agreeable to tx session. Complaints of soreness in R LE, however, pre-medicated prior to tx session and denying need for intervention.  He ambulated throughout session with RW and supervision. Completed grooming tasks standing at sink, did not require use of UE for support and completed with distant supervision. He then ambulated throughout room to therapy family room. He made self cup of coffee using Keurig machine as he has at home. Completed with supervision while pt reached outside BOS and across midline to complete task.  In therapy gym, completed x3 sets of 5 sit<>stand without UE support and supervision for LE strengthening. Seated rest breaks btwn trials.  STanding at side of parallel bars, completed x10 to R/L hip ABduction with guarding assist. Pt returned to room at end of session. Ambulated into bathroom and completed standing toileting task mod I. Hand hygiene completed standing at sink. Pt returned to bed at end of session, left seated sitting EOB with all needs in reach. Education provided throughout session regarding modified ADLs/IADLs, energy conservation, activity progression, return to work, and d/c planning.   Therapy Documentation Precautions:  Precautions Precautions: Fall Precaution Comments: Wound on R posterior thigh, sensitive to touch/movement of RLE Restrictions Weight Bearing Restrictions: No RLE Weight Bearing: Weight bearing as tolerated   Therapy/Group: Individual  Therapy  [name] L 08/27/2019, 7:07 AM

## 2019-08-27 NOTE — Progress Notes (Signed)
Physical Therapy Session Note  Patient Details  Name: Bradley Cox MRN: [id number] Date of Birth: [birth year]  Today's Date: 08/27/2019 PT Individual Time: 0900-1000; 1445-1530 PT Individual Time Calculation (min): 60 min and 45 min  Short Term Goals: Week 2:  PT Short Term Goal 1 (Week 2): =LTG due to ELOS  Skilled Therapeutic Interventions/Progress Updates:   Session 1:  Pt received supine in bed, agreeable to PT session. Pt reports 6/10 pain in R hip at rest, premedicated prior to start of therapy session. Bed mobility mod I. Sit to stand with Supervision to RW. Ambulation to bathroom with RW and Supervision. Pt is Supervision for clothing management, unable to void so no pericare required. Seated RLE gastroc stretch 3 x 60 sec each with sheet due to tightness this AM. Pt becomes emotional discussing his anxiety about upcoming discharge, provided emotional support and encouragement. Discussed pt's current mobility status and progress he will continue to make upon d/c home. Ambulation 2 x 200 ft with RW and Supervision, initially step-to gait pattern progressing to step-through with improved tolerance for WBing on RLE. Static standing balance with no UE support reaching outside BOS and across midline for bean bags then performing bean bag toss with close SBA for balance. Pt requests to lay back down at end of session, mod I for bed mobility. Pt left supine in bed with needs in reach, hot pack to R hip for pain management.  Session 2: Pt received supine in bed, agreeable to PT session. Pt reports pain in R hip at rest, not rated and declines intervention. Pt premedicated for pain prior to start of therapy session. Session focus on reviewing handout of BLE strengthening HEP for supine and standing exercises. Pt is able to perform supine therex x 10 reps each: heel slides, hip abd, IR/ER, SAQ with use of sheet for RLE management with heel slides. Pt is able to perform bed mobility independently.  Sit to stand with Supervision to RW. Pt is able to perform standing hip ext x 10 reps each with RW, performed other standing exercises earlier this date during previous therapy sessions. Pt's R hip has increased drainage noted, RN notified and able to come at end of session for dressing change. Pt left seated in bed in care of RN.  Therapy Documentation Precautions:  Precautions Precautions: Fall Precaution Comments: Wound on R posterior thigh, sensitive to touch/movement of RLE Restrictions Weight Bearing Restrictions: No RLE Weight Bearing: Weight bearing as tolerated    Therapy/Group: Individual Therapy   Excell Seltzer, PT, DPT  08/27/2019, 11:59 AM

## 2019-08-27 NOTE — Progress Notes (Signed)
Physical Therapy Session Note  Patient Details  Name: Bradley Cox MRN: [id number] Date of Birth: 02-Dec-1970  Today's Date: 08/27/2019 PT Individual Time: [id number] PT Individual Time Calculation (min): 41 min   Short Term Goals: Week 2:  PT Short Term Goal 1 (Week 2): =LTG due to ELOS  Skilled Therapeutic Interventions/Progress Updates:  Pt received in bed & agreeable to tx. Pt completes bed mobility mod I with HOB slightly elevated, sit<>stand with supervision and ambulates room<>gym with RW & supervision with increased gait speed & step through pattern. Pt negotiates 6 steps (6") backwards with RW with min assist from therapist just stabilizing RW. Pt engages in trampoline ball toss with wide & narrow BOS with CGA<>Min assist for balance with task focusing on standing balance. At end of session pt left in bed with bed alarm set & all needs in reach.  Therapy Documentation Precautions:  Precautions Precautions: Fall Precaution Comments: Wound on R posterior thigh, sensitive to touch/movement of RLE Restrictions Weight Bearing Restrictions: No RLE Weight Bearing: Weight bearing as tolerated  Pain: Pt c/o 7/10 RLE pain but reports he cannot receive pain meds until 2pm. Rest breaks provided PRN for pain management.   Therapy/Group: Individual Therapy  Waunita Schooner 08/27/2019, 1:48 PM

## 2019-08-27 NOTE — Discharge Summary (Signed)
Physician Discharge Summary  Patient ID: LEJEND DICECCO MRN: [id number] DOB/AGE: 50/23/1972 50 y.o.  Admit date: 08/16/2019 Discharge date: 08/29/2019  Discharge Diagnoses:  Principal Problem:   MSSA bacteremia Active Problems:   Obesity, Class III, BMI 40-49.9 (morbid obesity) ([place])   Essential hypertension   AKI (acute kidney injury) ([place])   Abscess of right hip   Paraspinal abscess ([facility])   Acute DVT (deep venous thrombosis) (H[facility])   Hyponatremia   Drug induced constipation   Uncontrolled type 2 diabetes mellitus with hyperglycemia ([facility])   Acute blood loss anemia   Postoperative pain   Discharged Condition: Stable  Significant Diagnostic Studies: Ct Angio Chest Pe W Or Wo Contrast  Result Date: 08/01/2019 CLINICAL DATA:  48 year old male with syncope. EXAM: CT ANGIOGRAPHY CHEST WITH CONTRAST TECHNIQUE: Multidetector CT imaging of the chest was performed using the standard protocol during bolus administration of intravenous contrast. Multiplanar CT image reconstructions and MIPs were obtained to evaluate the vascular anatomy. CONTRAST:  184mL OMNIPAQUE IOHEXOL 350 MG/ML SOLN COMPARISON:  None. FINDINGS: Cardiovascular: Borderline cardiomegaly. No pericardial effusion. Multi vessel coronary vascular calcification. The thoracic aorta is unremarkable. Evaluation of the pulmonary arteries is somewhat limited due to suboptimal opacification and visualization of the peripheral branches and respiratory motion artifact. No large or central pulmonary artery embolus identified. Right sided PICC with tip close to the cavoatrial junction noted. Mediastinum/Nodes: There is no hilar or mediastinal adenopathy. The esophagus and the thyroid gland are grossly unremarkable. No mediastinal fluid collection. Lungs/Pleura: Bibasilar linear atelectasis/scarring. No lobar consolidation, pleural effusion pneumothorax. The central airways are patent. Upper Abdomen: Multiple gallstones. The visualized upper  abdomen is otherwise unremarkable. Musculoskeletal: No chest wall abnormality. No acute or significant osseous findings. Review of the MIP images confirms the above findings. IMPRESSION: 1. No acute intrathoracic pathology. No CT evidence of central pulmonary artery embolus. 2. Cholelithiasis. Aortic Atherosclerosis (ICD10-I70.0). Electronically Signed   By: Anner Crete M.D.   On: 08/01/2019 20:55   Ct Hip Right Wo Contrast  Result Date: 07/30/2019 CLINICAL DATA:  Right hip abscess. EXAM: CT OF THE RIGHT HIP WITHOUT CONTRAST TECHNIQUE: Multidetector CT imaging of the right hip was performed according to the standard protocol. Multiplanar CT image reconstructions were also generated. COMPARISON:  None. FINDINGS: Bones/Joint/Cartilage Widening of the right sacroiliac joint with erosive changes most concerning for septic arthritis. Nondisplaced fracture of the inferior aspect of the right sacrum adjacent to the sacroiliac joint. Subchondral fracture involving the right ilium along the right SI joint. Small amount air along the anterior margin of the right sacroiliac joint within the iliopsoas muscle. No acute fracture or dislocation.  Normal alignment. Ligaments Ligaments are suboptimally evaluated by CT. Soft tissue, Muscles and Tendons Soft tissue wound overlying the right greater trochanter with air within the subcutaneous fat. There has been significant interval improvement in the complex fluid collection involving the right gluteal musculature extending into the pelvis. Small amount air in the left semimembranosus muscle partially visualized. No fluid collection or hematoma.  No soft tissue mass. IMPRESSION: 1. 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 Interval development of a subchondral fracture involving the right ilium and a nondisplaced fracture along the inferior aspect of the right sacrum. 2. Soft tissue wound overlying the right greater trochanter with air within the subcutaneous  fat likely postsurgical. Significant interval improvement in the complex fluid collection involving the right gluteal musculature extending into the pelvis. 3. Small amount air in the left semimembranosus muscle partially visualized most concerning  for persistent infection in the absence recent instrumentation involving this area. Electronically Signed   By: Kathreen Devoid   On: 07/30/2019 16:56   Vas [place] Lower Extremity Venous (dvt)  Result Date: 08/01/2019  Lower Venous Study Indications: Follow up dvt.  Limitations: Bandages and open wound. Comparison Study: previous study done 9/25 Performing Technologist: Abram Sander RVS  Examination Guidelines: A complete evaluation includes B-mode imaging, spectral Doppler, color Doppler, and power Doppler as needed of all accessible portions of each vessel. Bilateral testing is considered an integral part of a complete examination. Limited examinations for reoccurring indications may be performed as noted.  +---------+---------------+---------+-----------+----------+-----------------+ RIGHT    CompressibilityPhasicitySpontaneityPropertiesThrombus Aging    +---------+---------------+---------+-----------+----------+-----------------+ CFV      Full           Yes      Yes                                    +---------+---------------+---------+-----------+----------+-----------------+ SFJ      Full                                                           +---------+---------------+---------+-----------+----------+-----------------+ FV Prox  Full                                                           +---------+---------------+---------+-----------+----------+-----------------+ FV Mid   Full                                                           +---------+---------------+---------+-----------+----------+-----------------+ FV DistalFull                                                            +---------+---------------+---------+-----------+----------+-----------------+ POP                                                   Not visualized    +---------+---------------+---------+-----------+----------+-----------------+ PTV      None                                         Age Indeterminate +---------+---------------+---------+-----------+----------+-----------------+ PERO                                                  Not visualized    +---------+---------------+---------+-----------+----------+-----------------+   +----+---------------+---------+-----------+----------+--------------+  LEFTCompressibilityPhasicitySpontaneityPropertiesThrombus Aging +----+---------------+---------+-----------+----------+--------------+ CFV Full           Yes      Yes                                 +----+---------------+---------+-----------+----------+--------------+     Summary: Right: Findings consistent with age indeterminate deep vein thrombosis involving the right posterior tibial veins. No cystic structure found in the popliteal fossa. Left: No evidence of common femoral vein obstruction.  *See table(s) above for measurements and observations. Electronically signed by Harold Barban MD on 08/01/2019 at 4:08:15 PM.    Final     Labs:  Basic Metabolic Panel: Recent Labs  Lab 08/23/19 0450 08/27/19 0344  NA 135 137  K 3.8 3.7  CL 98 100  CO2 27 28  GLUCOSE 120* 146*  BUN 5* <5*  CREATININE 0.44* 0.57*  CALCIUM 8.6* 8.7*    CBC: Recent Labs  Lab 08/23/19 0450 08/24/19 2016 08/27/19 0344  WBC 5.4 4.9 4.0  NEUTROABS 2.7  --  1.7  HGB 7.4* 7.6* 7.5*  HCT 25.7* 25.6* 25.8*  MCV 96.3 93.4 93.8  PLT 251 250 237    CBG: Recent Labs  Lab 08/28/19 0633 08/28/19 0917 08/28/19 1205 08/28/19 1651 08/28/19 2104  GLUCAP 78 128* 135* 117* 134*   Family history.  Paternal grandmother with diabetes hypertension hyperlipidemia.  Denies colon cancer  Brief HPI:    Bradley Cox is a 48 y.o. right-handed male with history of hypertension, diabetes mellitus, morbid obesity.  Lives with his son 1 level home independent prior to admission working as a Administrator.  Presented 07/06/2019 with complaints of right flank pain x3 weeks and recently seen in urgent care.  Patient did receive a shot of Toradol to the right buttocks as well as steroid Dosepak.  Patient reports low back pain radiating to the right gluteus thigh area with progressive decline in right lower extremity strength and difficulty with ambulation.  Patient noted to have a low-grade fever as well as tachycardia and tachypnea.  Blood glucose level elevated with increased anion gap.  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  Placed on broad-spectrum antibiotics.  Neurosurgery consulted did not feel patient needed surgical intervention and exam consistent with peripheral nerve and lumbar plexus involvement.  Underwent interventional radiology guided paraspinal abscess drainage per Dr. Doreatha Martin and blood culture showed MSSA.  Infectious disease consulted advised antibiotic therapies x6 to 8 weeks with Ancef through 08/27/2019.  Echocardiogram and TEE completed showing ejection fraction [id number] normal systolic function no vegetation noted.  Patient underwent I&D of large gluteal abscess and neuroplasty of sciatic nerve IX 05/21/2019 per Dr. Doreatha Martin followed by multiple right lower extremity I&D's and wound VAC placement with changes and later with I&D closure and removal of wound VAC 08/08/2019 as well as Hemovac drain that was also later removed 08/10/2019.  Persistent serosanguineous drainage from wound compressive wrap and dressing was applied.  Follow-up CT scan of the hip performed showing no abscess felt drainage most likely related to size of initial abscess.  Hospital course complicated by right posterior  tibial vein DVT 08/01/2019.  CT angiogram of chest negative for pulmonary emboli patient was placed on Xarelto.  Hospital course anemia transfused 2 units packed red blood cells during hospital course latest hemoglobin 7.8.  Patient was admitted for  a comprehensive rehab program.   Hospital Course: QUINTERRIUS MEUTH was admitted to rehab 08/16/2019 for inpatient therapies to consist of PT, ST and OT at least three hours five days a week. Past admission physiatrist, therapy team and rehab RN have worked together to provide customized collaborative inpatient rehab.  Pertaining to patient disseminated MSSA infection leading to bacteremia L3-S1 paraspinal abscess as well as right psoas abscess right posterior thigh abscess.  Multiple I&D's wound VAC later removed patient would follow-up with Dr. Doreatha Martin.  In regards to MSSA bacteremia patient completed course of IV Ancef 08/27/2019 remaining afebrile.  Pain managed with use of OxyContin scheduled as well as Neurontin 3 times daily and Dilaudid for breakthrough pain after no relief with oxycodone immediate release and he was on scheduled Robaxin.  Hospital course complicated by right posterior tibial vein DVT identified on Dopplers 08/01/2019 placed on Xarelto with no bleeding episodes.  Acute blood loss anemia stable latest hemoglobin 7.5.  Blood sugars controlled hemoglobin A1c 12.6 insulin therapy as directed.  Blood pressures controlled on monitor and hydralazine patient would follow-up with primary MD.  Morbid obesity BMI 40-40 9.9 dietary follow-up.  Patient did have some drug-induced constipation resolved with laxative assistance.   Blood pressures were monitored on TID basis and stable  Diabetes has been monitored with ac/hs CBG checks and SSI was use prn for tighter BS control.   He/ is continent of bowel and bladder.  He/ has made gains during rehab stay and is attending therapies  He/ will continue to receive follow up therapies   after  discharge  Rehab course: During patient's stay in rehab weekly team conferences were held to monitor patient's progress, set goals and discuss barriers to discharge. At admission, patient required minimal guard ambulate 30 feet rolling walker, moderate assist stand pivot transfers, moderate assist sit to side-lying, minimal assist sit to supine and supine to sit.  Set up for upper body bathing moderate assist lower body bathing set up upper body dressing moderate assist lower body dressing  Physical exam.  Blood pressure 129/72 pulse 97 temperature 98.5 respirations 19 oxygen saturation 97% room air Constitutional alert and oriented no distress HEENT Head.  Normocephalic and atraumatic Eyes.  Pupils round and reactive to light EOMs normal no nystagmus Neck.  Supple nontender no tracheal deviation no thyromegaly without JVD Cardiovascular normal rate regular rhythm no friction rub or murmur heard Respiratory.  Effort normal no respiratory distress without wheezes GI.  Soft exhibits no distention nontender without rebound Musculoskeletal.  General.  Edema right lower extremity present Neurological.  Alert and oriented follows commands upper extremities 5 out of 5 right lower extremity 2- out of 5 hip flexors, knee extension and 4 out of 5 ankle dorsi plantarflexion.  Left lower extremity 4 out of 5 proximal to 5 out of 5 distally no focal sensory deficits. Skin.  Dressing in place to right lower extremity small amount of serosanguineous drainage without odor right hip with large padded dressing.  He has had improvement in activity tolerance, balance, postural control as well as ability to compensate for deficits. He has had improvement in functional use RUE/LUE  and RLE/LLE as well as improvement in awareness.  Patient completes bed mobility modified independent with head of bed elevated sit to stand with supervision ambulates to his room in gymnasium rolling walker supervision with increased gait  speed and step through pattern.  Negotiates stairs rolling walker minimal assistance.  He ambulates with rolling walker to complete ADLs.  Completed grooming task standing at sink.  He ambulates throughout room to therapy and family room.  Full family teaching completed plan discharge to home       Disposition: Discharge disposition: 01-Home or Self Care     Discharged home   Diet: Carb modified diet  Special Instructions: No driving smoking or alcohol   Dressing changes right hip with ABD pad and hypofix tape and right leg with ABD pad kerlix and ace wrap daily as needed  Medications at discharge 1.  Tylenol as needed 2.  Neurontin 300 mg p.o. 3 times daily 3.  Hydralazine 25 mg p.o. twice daily 4.  Insulin NPH 28 units twice daily 5.  Niferex 150 mg p.o. daily 6.  Robaxin 1000 mg p.o. 4 times daily 7.  Multivitamin daily 8.  Dilaudid 4 mg every 4 hours as needed 9.  OxyContin sustained release 40 mg every 12 hours 10.  MiraLAX twice daily hold for loose stools 11.  Xarelto 20 mg daily 12.  NovoLog 8 units 3 times daily with meals Discharge Instructions    Ambulatory referral to Physical Medicine Rehab   Complete by: As directed    Moderate complexity follow-up 1 to 2 weeks MSSA bacteremia with debility      Follow-up Information    Lovorn, Jinny Blossom, MD Follow up.   Specialty: Physical Medicine and Rehabilitation Why: Only as needed per [facility] Contact information: [id number] [place] [address] [phone]        Shona Needles, MD. Schedule an appointment as soon as possible for a visit in 2 week(s).   Specialty: Orthopedic Surgery Why: wound check Contact information: [place] [address] [phone]        Carlyle Basques, MD Follow up.   Specialty: Infectious Diseases Why: Call for appointment Contact information: [place] [address] [phone]        Kary Kos, MD  Follow up.   Specialty: Neurosurgery Why: Call for appointment Contact information: [address] [place] [address] [phone]           Signed: Lavon Paganini [place] 08/29/2019, 4:44 AM

## 2019-08-28 ENCOUNTER — Inpatient Hospital Stay (HOSPITAL_COMMUNITY): Payer: Self-pay | Admitting: Physical Therapy

## 2019-08-28 ENCOUNTER — Inpatient Hospital Stay (HOSPITAL_COMMUNITY): Payer: Self-pay | Admitting: Occupational Therapy

## 2019-08-28 LAB — GLUCOSE, CAPILLARY
Glucose-Capillary: 78 mg/dL (ref 70–99)
Glucose-Capillary: 128 mg/dL — ABNORMAL HIGH (ref 70–99)
Glucose-Capillary: 135 mg/dL — ABNORMAL HIGH (ref 70–99)
Glucose-Capillary: 117 mg/dL — ABNORMAL HIGH (ref 70–99)
Glucose-Capillary: 134 mg/dL — ABNORMAL HIGH (ref 70–99)

## 2019-08-28 MED ORDER — OXYCODONE HCL ER 40 MG PO T12A: 40.0000 mg | EXTENDED_RELEASE_TABLET | Freq: Two times a day (BID) | ORAL | 0 refills | Status: AC

## 2019-08-28 MED ORDER — METHOCARBAMOL 500 MG PO TABS: 1000.0000 mg | ORAL_TABLET | Freq: Four times a day (QID) | ORAL | 0 refills | Status: DC

## 2019-08-28 MED ORDER — INSULIN ASPART 100 UNIT/ML FLEXPEN: 8.0000 [IU] | PEN_INJECTOR | Freq: Three times a day (TID) | SUBCUTANEOUS | 11 refills | Status: DC

## 2019-08-28 MED ORDER — POLYSACCHARIDE IRON COMPLEX 150 MG PO CAPS: 150.0000 mg | ORAL_CAPSULE | Freq: Every day | ORAL | 0 refills | Status: DC

## 2019-08-28 MED ORDER — GABAPENTIN 300 MG PO CAPS: 300.0000 mg | ORAL_CAPSULE | Freq: Three times a day (TID) | ORAL | 0 refills | Status: DC

## 2019-08-28 MED ORDER — OXYCODONE HCL 10 MG PO TABS: 10.0000 mg | ORAL_TABLET | ORAL | 0 refills | Status: DC | PRN

## 2019-08-28 MED ORDER — POLYETHYLENE GLYCOL 3350 17 G PO PACK: 17.0000 g | PACK | Freq: Two times a day (BID) | ORAL | 0 refills | Status: DC

## 2019-08-28 MED ORDER — ACETAMINOPHEN 325 MG PO TABS: 650.0000 mg | ORAL_TABLET | Freq: Four times a day (QID) | ORAL | Status: AC | PRN

## 2019-08-28 MED ORDER — INSULIN NPH (HUMAN) (ISOPHANE) 100 UNIT/ML ~~LOC~~ SUSP: 28.0000 [IU] | Freq: Two times a day (BID) | SUBCUTANEOUS | 11 refills | Status: DC

## 2019-08-28 MED ORDER — HYDROMORPHONE HCL 2 MG PO TABS
4.0000 mg | ORAL_TABLET | ORAL | Status: DC | PRN
  Administered 2019-08-28 – 2019-08-29 (×3): 4 mg via ORAL
  Filled 2019-08-28 (×3): qty 2

## 2019-08-28 MED ORDER — HYDRALAZINE HCL 25 MG PO TABS: 25.0000 mg | ORAL_TABLET | Freq: Two times a day (BID) | ORAL | 0 refills | Status: DC

## 2019-08-28 MED ORDER — RIVAROXABAN 20 MG PO TABS: 20.0000 mg | ORAL_TABLET | Freq: Every day | ORAL | 1 refills | Status: DC

## 2019-08-28 NOTE — Progress Notes (Signed)
Occupational Therapy Session Note  Patient Details  Name: Bradley Cox MRN: [id number] Date of Birth: 10/28/1971  Today's Date: 08/28/2019 OT Individual Time: [id number] OT Individual Time Calculation (min): 60 min    Short Term Goals: Week 2:  OT Short Term Goal 1 (Week 2): STG=LTG due to LOS  Skilled Therapeutic Interventions/Progress Updates:    Pt seen for OT ADL bathing/dressing session. Pt sitting up in bed upon arrival, agreeable to tx session. Reports being pre-medicated prior to tx session and only complaints of stiffness in R hip. Pt's R LE wound noted to be saturated, RN made aware and changed dressing during session, however, wound cont to weep and needed to be re-dressed at end of session, RN made aware.  Pt ambulated throughout room with RW and distant supervision-mod I level. He ambulated into bathroom and completed toileting task mod I.  Completed UB bathing/dressing routine standing at sink mod I. Pt declined LB bathing/dressing at this time, plans to complete later in the day. He ambulated to therapy gym mod I with RW. Upon arriving to gym, pt becoming diaphoretic sweating profusely. Vitals assessed, 129/81, temp 97.8 and blood sugar 128. Following seated rest break and cold wash cloth provided, pt returned to room. He was left sitting EOB with all needs in reach, RN made aware of events of session and need for cont wound care.   Pt made mod I in room in prep for d/c home at mod I level tomorrow.   Therapy Documentation Precautions:  Precautions Precautions: Fall Precaution Comments: Wound on R posterior thigh, sensitive to touch/movement of RLE Restrictions Weight Bearing Restrictions: No RLE Weight Bearing: Weight bearing as tolerated   Therapy/Group: Individual Therapy  Mikaelyn Arthurs L 08/28/2019, 6:51 AM

## 2019-08-28 NOTE — Progress Notes (Signed)
[facility] PHYSICAL MEDICINE & REHABILITATION PROGRESS NOTE   Subjective/Complaints: Pt reports would like to f/u with Rehab after d/c- also wants a new PCP.  Pt also c/o more drainage from R calf    ROS: Patient denies fever, rash, sore throat, blurred vision, nausea, vomiting, diarrhea, cough, shortness of breath or chest pain,  headache, or mood change.     Objective:   No results found. Recent Labs    08/27/19 0344  WBC 4.0  HGB 7.5*  HCT 25.8*  PLT 237   Recent Labs    08/27/19 0344  NA 137  K 3.7  CL 100  CO2 28  GLUCOSE 146*  BUN <5*  CREATININE 0.57*  CALCIUM 8.7*    Intake/Output Summary (Last 24 hours) at 08/28/2019 0902 Last data filed at 08/28/2019 0745 Gross per 24 hour  Intake 1200 ml  Output 550 ml  Net 650 ml     Physical Exam: Vital Signs Blood pressure 122/79, pulse 87, temperature 98 F (36.7 C), temperature source Oral, resp. rate 18, weight (!) 156.6 kg, SpO2 97 %. Constitutional: No distress . Vital signs reviewed. Lying in bed; awake, alert, appropriate, NAD HEENT: EOMI, oral membranes moist Neck: supple Cardiovascular: RRR without murmur.  Respiratory: CTA Bilaterally without wheezes or rales. Normal effort    GI: BS +, non-tender, non-distended  Psych: full affect Musc: Right lower extremity edema and tenderness, improving Neurological: Alert Follows full commands Awareness of deficits.  Motor: Bilateral UE 5/5.  RLE 2/5 HF, 3/5 KE and 4/5 ADF/PF.  LLE: 4/5 prox to 5/5 distally.  Skin: Wounds with ongoing drainage. ACE calf/lower thigh wrap and R upper thigh wound have dressings -drainage on bed- soaked serous/clear drainage on bed. PICC line on RUE   Assessment/Plan: 1. 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  Physiatrist is providing close team supervision and 24 hour management of active medical problems listed below.  Physiatrist and rehab team continue to assess barriers to discharge/monitor patient progress toward functional and medical goals  Care Tool:  Bathing    Body parts bathed by patient: Right arm, Left arm, Chest, Abdomen, Front perineal area, Buttocks, Right upper leg, Left upper leg, Right lower leg, Left lower leg, Face   Body parts bathed by helper: Buttocks     Bathing assist Assist Level: Independent with assistive device     Upper Body Dressing/Undressing Upper body dressing Upper body dressing/undressing activity did not occur (including orthotics): N/A(patient already have shirt on) What is the patient wearing?: Pull over shirt    Upper body assist Assist Level: Independent    Lower Body Dressing/Undressing Lower body dressing    Lower body dressing activity did not occur: N/A(patient already have short on) What is the patient wearing?: Pants     Lower body assist Assist for lower body dressing: Independent with assitive device     Toileting Toileting    Toileting assist Assist for toileting: Independent with assistive device Assistive Device Comment: (pt used urinal)   Transfers Chair/bed transfer  Transfers assist     Chair/bed transfer assist level: Independent with assistive device     Locomotion Ambulation   Ambulation assist      Assist level: Supervision/Verbal cueing Assistive device: Walker-rolling Max distance: 150 ft  Walk 10 feet activity   Assist     Assist level: Supervision/Verbal cueing Assistive device: Walker-rolling   Walk 50 feet activity   Assist Walk 50 feet with 2 turns activity did not occur: Safety/medical concerns(Fatigue, high pain levels)  Assist level: Supervision/Verbal cueing Assistive device: Walker-rolling    Walk 150  feet activity   Assist Walk 150 feet activity did not occur: Safety/medical concerns(fatigue, high pain levels)  Assist level: Supervision/Verbal cueing Assistive device: Walker-rolling    Walk 10 feet on uneven surface  activity   Assist Walk 10 feet on uneven surfaces activity did not occur: Safety/medical concerns(fatigue, high pain levels)         Wheelchair     Assist Will patient use wheelchair at discharge?: No Type of Wheelchair: Manual    Wheelchair assist level: Supervision/Verbal cueing Max wheelchair distance: 150'    Wheelchair 50 feet with 2 turns activity    Assist        Assist Level: Supervision/Verbal cueing   Wheelchair 150 feet activity     Assist      Assist Level: Supervision/Verbal cueing   Blood pressure 122/79, pulse 87, temperature 98 F (36.7 C), temperature source Oral, resp. rate 18, weight (!) 156.6 kg, SpO2 97 %.  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 Status post multiple I&D's as well as wound VAC changes. Hemovac drain removed 08/10/2019   Continue CIR therapies PT, OT 2. Antithrombotics: -DVT/anticoagulation:Xarelto for right posterior tibial vein DVT identified on Dopplers 08/01/2019.  Monitor for  bleeding. -antiplatelet therapy: N/A 3. Pain Management:OxyContin 40 mg every 12 hours, Neurontin 300 mg 3 times daily,  oxycodone as needed  -10/22 increased robaxin to 1000mg  qid  -10/25 pain improved  4. Mood:Provide emotional support -antipsychotic agents: N/A 5. Neuropsych: This patientiscapable of making decisions on hisown behalf. 6. Skin/Wound Care:Compressive wrap and dressing changesat least daily-ortho following  -remaining sutures out   7. Fluids/Electrolytes/Nutrition:Routine in and outs 8. ID/MSSA  bacteremia/disseminated infection. Continue IV Ancef through 08/27/2019  -  nystatin for thrush--improved---continue for 5 days total (10/22) then stop  10/26- today last day of IV ABX. No more ABX of any kind.  10/27- will d/c PICC today. 9. Acute blood loss anemia. Continue iron supplement  Hemoglobin 9.1 on 10/16    Hb 7.7---->7.4 10/21 and 10/22---> 7.6 10/23  -recheck Monday  10/26- Hb 7.5- stable currently   10. Diabetes mellitus with hyperglycemia. Latest hemoglobin A1c 12.6. NovoLog 8 units 3 times daily with meals, insulin NPH 28 units twice daily.  -check CBG's ac/hs    CBG (last 3)  Recent Labs    08/27/19 1659 08/27/19 2115 08/28/19 0633  GLUCAP 93 133* 78   -10/26 sugars improved---no changes today 11. Hypertension. Hydralazine 25 mg twice daily. Monitor with increased mobility  Controlled on 10/25 12. Morbid obesity. BMI 40-49.9. Dietary follow-up 13. Drug-induced constipation. Laxative assistance  Moving bowels 14.  Hyponatremia  Sodium 132 on 10/16   10/19- Na 136  -recheck Monday  10/26- Na 137   LOS: 12 days A FACE TO FACE EVALUATION WAS PERFORMED  Jan Olano 08/28/2019, 9:02 AM

## 2019-08-28 NOTE — Progress Notes (Signed)
Physical Therapy Discharge Summary  Patient Details  Name: Bradley Cox MRN: [id number] Date of Birth: 1971/10/13  Today's Date: 08/28/2019 PT Individual Time: 7048-8891 and 1306-1430 PT Individual Time Calculation (min): 24 min and 84 min and  Today's Date: 08/28/2019 PT Missed Time: 27 Minutes Missed Time Reason: Pain   Patient has met 9 of 9 long term goals due to improved activity tolerance, improved balance, increased strength, increased range of motion and decreased pain. Patient to discharge at an ambulatory level Modified Independent, except requiring CGA for stair navigation using RW. Patient's care partner is independent to provide the necessary physical assistance at discharge.  All goals met.  Recommendation:  Patient will benefit from ongoing skilled PT services in home health setting to continue to advance safe functional mobility, address ongoing impairments in R LE strength, gait training with LRAD, stair navigation, activity tolerance, dynamic standing balance, and minimize fall risk.  Equipment: wide RW   Reasons for discharge: treatment goals met and discharge from hospital  Patient/family agrees with progress made and goals achieved: Yes   Skilled Therapeutic Interventions/Progress Updates: Session 1: Pt received supine in bed stating he is not having a goody day today reporting "the pain is ungodly" (referring to R LE hip) and that it is significantly higher than the past few days; pt does state he feels that his pain has not been as well managed as he was hoping with current pain reported as 8.5-9/10. Pt reports he believes the pain is worse today due to having performing a total of 30 repetitions of supine/standing R LE hip abduction/adduction exercise - therapist educated pt on limiting that specific exercise to ~10reps/day once at home and then gradually increasing each day by a few reps depending on his pain level. Therapist notified RN of pt's continued  high pain levels but pt not do for any further medication at this time. Therapist provided pt with moist heat pack to R LE and educated to leave it on for ~51mnutes (RN also notified to remove after that time). Deferred performing OOB activity at this time to allow for decreased pain prior to next therapy session. Therapist also adjusted temperature in pt's room and provided sheet for comfort to assist with pain management and relaxation. Pt left supine in bed with needs in reach. Missed 36 minutes of skilled physical therapy due to pain; however, made up 9 of those missed minutes in afternoon therapy session.  Session 2: Pt received sitting EOB reporting he feels better this afternoon and that the heating pad assisted with decreasing his pain - pt appears to feel better overall this session. Sit<>stands using RW mod-I throughout session. Ambulated ~1554fto main therapy gym using RW mod-I. Ascended/descended 3 steps using RW per home set-up going up backwards with step-to pattern and descending forward with step-to pattern - therapist provided CGA for steadying RW and pt able to appropriately cue therapist as he would need to do with his family. Ascended/descended 9 steps using B HRs with reciprocal pattern on ascent and step-to pattern on descent with supervision. Ambulated ~15082fo car room using RW mod-I. Performed ambulatory car transfer using RW with supervision and pt able to problem solve how to perform transfer safely without cuing. Ambulated ~35f60f up/down ramp using RW with supervision and cuing on safe AD management. Pt started discussing his family dynamics and plan for support at discharge and pt has decided to go stay with his father as opposed to returning to the home he  was renting with other family prior to hospitalization as those individuals will not support him very well. Ambulated ~182f to main therapy gym using RW mod-I. Performed the following exercises as review for HEP using BUE  support on RW and 2 sets of 10 reps of each: standing R LE straight leg hip extension and flexion as well as hamstring curls - cuing for proper technique/form. Performed R LE step up/down on 6" step with B UE support on RW and supervision x10reps - pt very excited to be able to perform this today stating ~2days ago he was unable to lift his leg this high. MD present during session with pt reporting he feels that his pain has not been well managed during CIR stay. Ambulated ~1585fback to room using RW mod-I. Sit>supine independently. Therapist provided moist heat pad for pt's R hip and again educated him on removing it after ~2030mtes. Pt left supine in bed with needs in reach and pt cleared to be mod-I ambulatory in room using RW with pt educated on fall risk safety.   PT Discharge Precautions/Restrictions Precautions Precautions: Fall Precaution Comments: Wound on R posterior thigh Restrictions Weight Bearing Restrictions: No RLE Weight Bearing: Weight bearing as tolerated Pain Pain Assessment Pain Scale: 0-10 Pain Score: 9  Faces Pain Scale: Hurts worst Pain Type: Acute pain;Surgical pain Pain Location: Hip Pain Orientation: Right;Lateral Pain Radiating Towards:   Pain Descriptors / Indicators: Aching;Sharp;Sore;Constant Pain Onset: On-going Pain Intervention(s): RN made aware;Rest;Relaxation;Other (Comment)(applied heat) Multiple Pain Sites: No Vision/Perception  Perception Perception: Within Functional Limits Praxis Praxis: Intact  Cognition Overall Cognitive Status: Within Functional Limits for tasks assessed Arousal/Alertness: Awake/alert Orientation Level: Oriented X4 Attention: Focused;Sustained Focused Attention: Appears intact Sustained Attention: Appears intact Memory: Appears intact Awareness: Appears intact Problem Solving: Appears intact Safety/Judgment: Appears intact Sensation Sensation Light Touch: Impaired Detail Peripheral sensation comments: reports  has had some numbness along superior R lateral thigh region since surgery Hot/Cold: Not tested Proprioception: Appears Intact Stereognosis: Not tested Additional Comments: Decreased sensation along RLE Coordination Gross Motor Movements are Fluid and Coordinated: Yes Fine Motor Movements are Fluid and Coordinated: Yes Heel Shin Test: WFL LLE, RLE impaired due to pain Motor  Motor Motor: Within Functional Limits Motor - Skilled Clinical Observations: R LE pain and weakness limit mobility/balance Motor - Discharge Observations: improved; however, continues to have R LE pain and weakness that impacts pt's mobility and balance  Mobility Bed Mobility Bed Mobility: Sit to Supine;Supine to Sit Rolling Right: Independent Rolling Left: Independent Supine to Sit: Independent Sit to Supine: Independent Transfers Transfers: Sit to Stand;Stand to Sit;Stand Pivot Transfers Sit to Stand: Independent with assistive device Stand to Sit: Independent with assistive device Stand Pivot Transfers: Independent with assistive device Transfer (Assistive device): Rolling walker Locomotion  Gait Ambulation: Yes Gait Assistance: Independent with assistive device Gait Distance (Feet): 150 Feet Assistive device: Rolling walker Gait Gait: Yes Gait Pattern: Impaired Gait Pattern: Decreased step length - left;Decreased step length - right;Decreased stance time - right;Poor foot clearance - right Gait velocity: decreased Stairs / Additional Locomotion Stairs: Yes Stairs Assistance: Contact Guard/Touching assist Stair Management Technique: With walker Number of Stairs: 3 Height of Stairs: 6 Ramp: Supervision/Verbal cueing Curb: Supervision/Verbal cueing Wheelchair Mobility Wheelchair Mobility: No  Trunk/Postural Assessment  Cervical Assessment Cervical Assessment: Within Functional Limits Thoracic Assessment Thoracic Assessment: Within Functional Limits Lumbar Assessment Lumbar Assessment: Within  Functional Limits Postural Control Postural Control: Deficits on evaluation Postural Limitations: decreased  Balance Balance Balance Assessed: Yes Static Sitting  Balance Static Sitting - Balance Support: Feet supported Static Sitting - Level of Assistance: 7: Independent Dynamic Sitting Balance Dynamic Sitting - Balance Support: No upper extremity supported;During functional activity Dynamic Sitting - Level of Assistance: 7: Independent Static Standing Balance Static Standing - Balance Support: Bilateral upper extremity supported Static Standing - Level of Assistance: 6: Modified independent (Device/Increase time) Dynamic Standing Balance Dynamic Standing - Balance Support: Bilateral upper extremity supported Dynamic Standing - Level of Assistance: 6: Modified independent (Device/Increase time) Extremity Assessment  RLE Assessment RLE Assessment: Exceptions to [facility] General Strength Comments: Grossly demonstrated the below strength grades during functional mobility and LE exercises RLE Strength Right Hip Flexion: 2+/5 Right Hip Extension: 3-/5 Right Hip ABduction: 2+/5 Right Hip ADduction: 3/5 Right Knee Flexion: 3-/5 Right Knee Extension: 3+/5 Right Ankle Dorsiflexion: 4-/5 Right Ankle Plantar Flexion: 4-/5 LLE Assessment LLE Assessment: Within Functional Limits    Tawana Scale, PT, DPT 08/28/2019, 7:57 AM

## 2019-08-28 NOTE — Progress Notes (Signed)
Occupational Therapy Discharge Summary  Patient Details  Name: Bradley Cox MRN: [id number] Date of Birth: December 19, 1970  Patient has met 107 of 11 long term goals due to improved activity tolerance, improved balance, postural control and improved coordination.  Patient to discharge at overall Modified Independent level.  Patient's care partner is independent to provide the necessary physical assistance at discharge.  Pt to d/c home with father. Mother and father will assist with IADLs PRN as well as assist with dressing changes.  Pt is mod I using RW for functional mobility and transfers. Bathing at sink level 2/2 LE wounds.    Recommendation:  Patient will benefit from ongoing skilled OT services in home health setting to continue to advance functional skills in the area of BADL, iADL and Reduce care partner burden.  Equipment: Family installed raised toilet seat, pt to order TTB once cleared to shower.   Reasons for discharge: treatment goals met and discharge from hospital  Patient/family agrees with progress made and goals achieved: Yes  OT Discharge Precautions/Restrictions  Precautions Precautions: Fall Precaution Comments: Wound on R posterior thigh Restrictions Weight Bearing Restrictions: No RLE Weight Bearing: Weight bearing as tolerated Vision Baseline Vision/History: Wears glasses Wears Glasses: (PRN) Patient Visual Report: No change from baseline Vision Assessment?: No apparent visual deficits Perception  Perception: Within Functional Limits Praxis Praxis: Intact Cognition Overall Cognitive Status: Within Functional Limits for tasks assessed Arousal/Alertness: Awake/alert Orientation Level: Oriented X4 Attention: Focused Focused Attention: Appears intact Memory: Appears intact Awareness: Appears intact Problem Solving: Appears intact Safety/Judgment: Appears intact Sensation Sensation Light Touch: Appears Intact Proprioception: Appears Intact Additional  Comments: Decreased sensation along RLE Coordination Gross Motor Movements are Fluid and Coordinated: Yes Fine Motor Movements are Fluid and Coordinated: Yes Motor  Motor Motor: Within Functional Limits Trunk/Postural Assessment  Cervical Assessment Cervical Assessment: Within Functional Limits Thoracic Assessment Thoracic Assessment: Within Functional Limits Lumbar Assessment Lumbar Assessment: Within Functional Limits Postural Control Postural Control: Within Functional Limits  Balance Balance Balance Assessed: Yes Static Sitting Balance Static Sitting - Balance Support: Feet supported Static Sitting - Level of Assistance: 7: Independent Dynamic Sitting Balance Dynamic Sitting - Balance Support: No upper extremity supported;During functional activity Dynamic Sitting - Level of Assistance: 7: Independent Sitting balance - Comments: Sitting EOB Static Standing Balance Static Standing - Balance Support: During functional activity Static Standing - Level of Assistance: 6: Modified independent (Device/Increase time) Dynamic Standing Balance Dynamic Standing - Balance Support: During functional activity Dynamic Standing - Level of Assistance: 6: Modified independent (Device/Increase time) Extremity/Trunk Assessment RUE Assessment RUE Assessment: Within Functional Limits General Strength Comments: 5/5, history of rotator cuff and elbow injury not currently limiting function LUE Assessment LUE Assessment: Within Functional Limits   Nykerria Macconnell L 08/28/2019, 7:22 AM

## 2019-08-28 NOTE — Plan of Care (Signed)
  Problem: Consults Goal: RH GENERAL PATIENT EDUCATION Description: See Patient Education module for education specifics. Outcome: Progressing Goal: Skin Care Protocol Initiated - if Braden Score 18 or less Description: If consults are not indicated, leave blank or document N/A Outcome: Progressing Goal: Nutrition Consult-if indicated Outcome: Progressing Goal: Diabetes Guidelines if Diabetic/Glucose > 140 Description: If diabetic or lab glucose is > 140 mg/dl - Initiate Diabetes/Hyperglycemia Guidelines & Document Interventions  Outcome: Progressing   Problem: RH BOWEL ELIMINATION Goal: RH STG MANAGE BOWEL WITH ASSISTANCE Description: STG Manage Bowel with Mod i Assistance. Outcome: Progressing Goal: RH STG MANAGE BOWEL W/MEDICATION W/ASSISTANCE Description: STG Manage Bowel with Medication with  Mod I Assistance. Outcome: Progressing   Problem: RH SKIN INTEGRITY Goal: RH STG SKIN FREE OF INFECTION/BREAKDOWN Description: Manage wound/incision with Max assist Outcome: Progressing Goal: RH STG MAINTAIN SKIN INTEGRITY WITH ASSISTANCE Description: STG Maintain Skin Integrity With Max Assistance. Outcome: Progressing   Problem: RH SAFETY Goal: RH STG ADHERE TO SAFETY PRECAUTIONS W/ASSISTANCE/DEVICE Description: STG Adhere to Safety Precautions With Assistance/Device. Independent Outcome: Progressing   Problem: RH PAIN MANAGEMENT Goal: RH STG PAIN MANAGED AT OR BELOW PT'S PAIN GOAL Description: Less than 4 Outcome: Progressing   Problem: RH KNOWLEDGE DEFICIT GENERAL Goal: RH STG INCREASE KNOWLEDGE OF SELF CARE AFTER HOSPITALIZATION Description: Patient will be able to verbalize self care after discharge with cues/reminders Outcome: Progressing

## 2019-08-28 NOTE — Patient Care Conference (Signed)
Inpatient RehabilitationTeam Conference and Plan of Care Update Date: 08/28/2019   Time: 11:10 AM   Patient Name: Bradley Cox      Medical Record Number: [id number]  Date of Birth: [birth year] Sex: Male         Room/Bed: 4M01C/4M01C-01 Payor Info: Payor: MEDICAID POTENTIAL / Plan: MEDICAID POTENTIAL / Product Type: *No Product type* /    Admit Date/Time:  08/16/2019  2:59 PM  Primary Diagnosis:  MSSA bacteremia  Patient Active Problem List   Diagnosis Date Noted  . Hyponatremia   . Drug induced constipation   . Uncontrolled type 2 diabetes mellitus with hyperglycemia ([place])   . Acute blood loss anemia   . Postoperative pain   . Acute DVT (deep venous thrombosis) ([place]) 07/29/2019  . MSSA bacteremia 07/10/2019  . Abscess of right hip 07/09/2019  . Paraspinal abscess ([place]) 07/09/2019  . 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 Sepsis ([place]) 07/06/2019  . AKI (acute kidney injury) ([place]) 07/06/2019  . Hyperkalemia 07/06/2019  . Obesity, Class III, BMI 40-49.9 (morbid obesity) ([place]) 01/21/2009  . GERD 01/21/2009  . DYSPHAGIA 01/21/2009  . Essential hypertension 01/07/2009  . ASTHMA 01/07/2009    Expected Discharge Date: Expected Discharge Date: 08/29/19  Team Members Present: Physician leading conference: Dr. Courtney Heys Social Worker Present: Lennart Pall, LCSW Nurse Present: Ellison Carwin, LPN PT Present: Excell Seltzer, PT OT Present: Amy Rounds, OT SLP Present: Jettie Booze, CF-SLP Other (Discipline and Name): Case manager:  Karene Fry, RN PPS Coordinator present : Ileana Ladd, Burna Mortimer, SLP     Current Status/Progress Goal Weekly Team Focus  Bowel/Bladder   Continent of B/B LBM-08/26/19  Remain continent  Assist with toileting as needed   Swallow/Nutrition/ Hydration             ADL's   Mod I with RW  Mod I overall  ADL/IADL re-training, d/c planning, d/c home tomorrow   Mobility   mod I overall, CGA with stairs with RW  Supervision  overall, CGA stairs  grad day today, d/c planning   Communication             Safety/Cognition/ Behavioral Observations            Pain   Frequent complaints of pain to right hip and leg-pain managed with scheduled oxycontin and robaxin prn oxycodoneand tylenol, pain level usually stays around 6-9 on 0/10 scale  Pain level <=3/10  Assess and address pain every shift and as needed   Skin   right hip surgical incision draining moderate amount of serous drainage and serosanguineous drainage to area behind right knee  Free of s/sx of infection, promote healing, continue dressing changes  Assess skin and surgical areas, and address complications as needed    Rehab Goals Patient on target to meet rehab goals: Yes *See Care Plan and progress notes for long and short-term goals.     Barriers to Discharge  Current Status/Progress Possible Resolutions Date Resolved   Nursing                  PT                    OT                  SLP                SW  Discharge Planning/Teaching Needs:  Pt to d/c to father's home where 24/7 support is available.  Teaching to be completed prior to d/c.   Team Discussion: Drainage from leg, abx completed yesterday, DC PICC, wants to change PCP, will refer to community health and wellness.  Pain 7+, wound care BID, hypoglycemic episodes, on insulin.  Is new Diabetic, A1c 14, needs diabetes education.  OT - having an off day, diaphoretic, overall mod I walker.  PT mod I overall walker, CGA stand.   Revisions to Treatment Plan: N/A     Medical Summary Current Status: pain always 7 and above; wound care 2x/day- some hypoglycemia- no SSI- new diabetic; will need training Weekly Focus/Goal: d/c tomorrow  Barriers to Discharge: Behavior;Home enviroment access/layout;Medical stability;Medication compliance;New diabetic  Barriers to Discharge Comments: d/c tomorrow Possible Resolutions to Barriers: get better DM control   Continued Need  for Acute Rehabilitation Level of Care: The patient requires daily medical management by a physician with specialized training in physical medicine and rehabilitation for the following reasons: Direction of a multidisciplinary physical rehabilitation program to maximize functional independence : Yes Medical management of patient stability for increased activity during participation in an intensive rehabilitation regime.: Yes Analysis of laboratory values and/or radiology reports with any subsequent need for medication adjustment and/or medical intervention. : Yes   I attest that I was present, lead the team conference, and concur with the assessment and plan of the team.   Jodell Cipro M 08/28/2019, 1:55 PM  Team conference was held via web/ teleconference due to [place] - 19

## 2019-08-29 MED ORDER — HYDROMORPHONE HCL 4 MG PO TABS: 4.0000 mg | ORAL_TABLET | ORAL | 0 refills | Status: DC | PRN

## 2019-08-29 NOTE — Progress Notes (Signed)
Social Work Discharge Note   The overall goal for the admission was met for:   Discharge location: Yes  Length of Stay: Yes  Discharge activity level: Yes  Home/community participation: Yes  Services provided included: MD, RD, PT, OT, RN, Pharmacy and SW  Financial Services: uninsured  Follow-up services arranged: Home Health: PT via [facility]**, DME: wide rolling walker via [facility] and Patient/Family has no preference for HH/DME agencies   **lengthy discussion with pt about f/u PT/OT recommendations.  Pt is uninsured.  Reaching out to [facility] who, per financial review, notes pt does not qualify for "charity care" and would have to privately for services (~195$ per visit). Pt has decided that he would like to start with one private pay visit and decide if he wishes to have anything further.  He has been provide with HEP and mother reports she will "make sure he's doing it."    Comments (or additional information):    Contact info:  Pt @ [phone]  Patient/Family verbalized understanding of follow-up arrangements: Yes  Individual responsible for coordination of the follow-up plan: pt  Confirmed correct DME delivered: Adrean Heitz 08/29/2019    [facility]

## 2019-08-29 NOTE — Progress Notes (Signed)
Patient discharged home with family, all belongings sent home with patient. No complications noted at this time.  Audie Clear, LPN

## 2019-08-29 NOTE — Progress Notes (Signed)
[facility] PHYSICAL MEDICINE & REHABILITATION PROGRESS NOTE   Subjective/Complaints: Pt reports barely felt pain after dilaudid was started esp with kpad- likes new regimen much better.    ROS: Patient denies fever, rash, sore throat, blurred vision, nausea, vomiting, diarrhea, cough, shortness of breath or chest pain,  headache, or mood change.     Objective:   No results found. Recent Labs    08/27/19 0344  WBC 4.0  HGB 7.5*  HCT 25.8*  PLT 237   Recent Labs    08/27/19 0344  NA 137  K 3.7  CL 100  CO2 28  GLUCOSE 146*  BUN <5*  CREATININE 0.57*  CALCIUM 8.7*    Intake/Output Summary (Last 24 hours) at 08/29/2019 0913 Last data filed at 08/28/2019 2330 Gross per 24 hour  Intake 462 ml  Output 502 ml  Net -40 ml     Physical Exam: Vital Signs Blood pressure (!) 135/98, pulse 91, temperature 98.1 F (36.7 C), temperature source Oral, resp. rate 18, weight (!) 156.6 kg, SpO2 97 %. Constitutional: No distress . Vital signs reviewed. Lying in bed; awake, alert, appropriate, NAD much brighter, more talkative since pain better controlled (didn't mention this as issue prior) HEENT: EOMI, oral membranes moist Neck: supple Cardiovascular: RRR without murmur.  Respiratory: CTA Bilaterally without wheezes or rales. Normal effort    GI: BS +, non-tender, non-distended  Psych: full affect Musc: Right lower extremity edema and tenderness, improving Neurological: Alert Follows full commands Awareness of deficits.  Motor: Bilateral UE 5/5.  RLE 2/5 HF, 3/5 KE and 4/5 ADF/PF.  LLE: 4/5 prox to 5/5 distally.  Skin: Wounds with ongoing drainage. ACE calf/lower thigh wrap and R upper thigh wound have dressings -drainage on bed- soaked serous/clear drainage on bed. PICC line on RUE   Assessment/Plan: 1. 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  Physiatrist is providing close team supervision and 24 hour management of active medical problems listed below.  Physiatrist and rehab team continue to assess barriers to discharge/monitor patient progress toward functional and medical goals  Care Tool:  Bathing    Body parts bathed by patient: Right arm, Left arm, Chest, Abdomen, Front perineal area, Buttocks, Right upper leg, Left upper leg, Right lower leg, Left lower leg, Face   Body parts bathed by helper: Buttocks     Bathing assist Assist Level: Independent with assistive device     Upper Body Dressing/Undressing Upper body dressing Upper body dressing/undressing activity did not occur (including orthotics): N/A(patient already have shirt on) What is the patient wearing?: Pull over shirt    Upper body assist Assist Level: Independent    Lower Body Dressing/Undressing Lower body dressing    Lower body dressing activity did not occur: N/A(patient already have short on) What is the patient wearing?: Pants     Lower body assist Assist for lower body dressing: Independent with assitive device     Toileting Toileting    Toileting assist Assist for toileting: Independent with assistive device Assistive Device Comment: (pt used urinal)   Transfers Chair/bed transfer  Transfers assist     Chair/bed transfer assist level: Independent with assistive device Chair/bed transfer assistive device: Programmer, multimedia   Ambulation assist      Assist level:  Independent with assistive device Assistive device: Walker-rolling Max distance: 150 ft   Walk 10 feet activity   Assist     Assist level: Independent with assistive device Assistive device: Walker-rolling   Walk 50 feet activity   Assist Walk 50 feet with 2 turns activity did not occur:  Safety/medical concerns(Fatigue, high pain levels)  Assist level: Independent with assistive device Assistive device: Walker-rolling    Walk 150 feet activity   Assist Walk 150 feet activity did not occur: Safety/medical concerns(fatigue, high pain levels)  Assist level: Independent with assistive device Assistive device: Walker-rolling    Walk 10 feet on uneven surface  activity   Assist Walk 10 feet on uneven surfaces activity did not occur: Safety/medical concerns(fatigue, high pain levels)   Assist level: Supervision/Verbal cueing(up/down ramp) Assistive device: Aeronautical engineer Will patient use wheelchair at discharge?: No Type of Wheelchair: Manual    Wheelchair assist level: Supervision/Verbal cueing Max wheelchair distance: 150'    Wheelchair 50 feet with 2 turns activity    Assist        Assist Level: Supervision/Verbal cueing   Wheelchair 150 feet activity     Assist      Assist Level: Supervision/Verbal cueing   Blood pressure (!) 135/98, pulse 91, temperature 98.1 F (36.7 C), temperature source Oral, resp. rate 18, weight (!) 156.6 kg, SpO2 97 %.  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 Status post multiple I&D's as well as wound VAC changes. Hemovac drain removed 08/10/2019   Continue CIR therapies PT, OT 2. Antithrombotics: -DVT/anticoagulation:Xarelto for right posterior tibial vein DVT identified on Dopplers 08/01/2019.  Monitor for  bleeding. -antiplatelet therapy: N/A 3. Pain Management:OxyContin 40 mg every 12 hours, Neurontin 300 mg 3 times daily,  oxycodone as needed  -10/22 increased robaxin to 1000mg  qid  -10/25 pain improved   10/28- changed oxycodone 10 mg to Dilaudid 4 mg q4 hours prn- will send home on Dilaudid since  working so much better.  4. Mood:Provide emotional support -antipsychotic agents: N/A 5. Neuropsych: This patientiscapable of making decisions on hisown behalf. 6. Skin/Wound Care:Compressive wrap and dressing changesat least daily-ortho following  -remaining sutures out   7. Fluids/Electrolytes/Nutrition:Routine in and outs 8. ID/MSSA bacteremia/disseminated infection. Continue IV Ancef through 08/27/2019  -  nystatin for thrush--improved---continue for 5 days total (10/22) then stop  10/26- today last day of IV ABX. No more ABX of any kind.  10/27- will d/c PICC today. 9. Acute blood loss anemia. Continue iron supplement  Hemoglobin 9.1 on 10/16    Hb 7.7---->7.4 10/21 and 10/22---> 7.6 10/23  -recheck Monday  10/26- Hb 7.5- stable currently   10. Diabetes mellitus with hyperglycemia. Latest hemoglobin A1c 12.6. NovoLog 8 units 3 times daily with meals, insulin NPH 28 units twice daily.  -check CBG's ac/hs    CBG (last 3)  Recent Labs    08/28/19 1205 08/28/19 1651 08/28/19 2104  GLUCAP 135* 117* 134*   -10/26 sugars improved---no changes today 11. Hypertension. Hydralazine 25 mg twice daily. Monitor with increased mobility  Controlled on 10/25 12. Morbid obesity. BMI 40-49.9. Dietary follow-up 13. Drug-induced constipation. Laxative assistance  Moving bowels 14.  Hyponatremia  Sodium 132 on 10/16   10/19- Na 136  -recheck Monday  10/26- Na 137 15. Dispo- d/c today- will have pt f/u in clinic per his request.  LOS: 13 days A FACE TO FACE EVALUATION WAS PERFORMED  Zylee Marchiano 08/29/2019, 9:13 AM

## 2019-08-29 NOTE — Discharge Instructions (Signed)
Inpatient Rehab Discharge Instructions  GIORGIO NAIL Discharge date and time: No discharge date for patient encounter.   Activities/Precautions/ Functional Status: Activity: activity as tolerated Diet: diabetic diet Wound Care: keep wound clean and dry Functional status:  ___ No restrictions     ___ Walk up steps independently ___ 24/7 supervision/assistance   ___ Walk up steps with assistance ___ Intermittent supervision/assistance  ___ Bathe/dress independently ___ Walk with walker     _x__ Bathe/dress with assistance ___ Walk Independently    ___ Shower independently ___ Walk with assistance    ___ Shower with assistance ___ No alcohol     ___ Return to work/school ________    COMMUNITY REFERRALS UPON DISCHARGE:    Home Health:   PT                         Agency:  [facility] Phone: [phone]   Medical Equipment/Items Ordered: walker                                                     Agency/Supplier:  [facility] @ [phone]   Special Instructions: No driving smoking or alcohol  Dressing changes right hip with ABD pad and Hypofix tape.  Right leg with ABD pads Kerlix and Ace daily as needed   My questions have been answered and I understand these instructions. I will adhere to these goals and the provided educational materials after my discharge from the hospital.  Patient/Caregiver Signature _______________________________ Date __________  Clinician Signature _______________________________________ Date __________  Please bring this form and your medication list with you to all your follow-up doctor's appointments.

## 2019-08-30 LAB — GLUCOSE, CAPILLARY: Glucose-Capillary: 129 mg/dL — ABNORMAL HIGH (ref 70–99)

## 2019-08-31 ENCOUNTER — Other Ambulatory Visit: Payer: Self-pay

## 2019-08-31 ENCOUNTER — Ambulatory Visit: Payer: Self-pay | Attending: Internal Medicine | Admitting: Internal Medicine

## 2019-08-31 ENCOUNTER — Encounter: Payer: Self-pay | Admitting: Internal Medicine

## 2019-08-31 VITALS — BP 130/74 | HR 101 | Temp 97.9°F | Resp 16 | Ht 78.0 in | Wt 340.8 lb

## 2019-08-31 DIAGNOSIS — M4620 Osteomyelitis of vertebra, site unspecified: Secondary | ICD-10-CM

## 2019-08-31 DIAGNOSIS — I82441 Acute embolism and thrombosis of right tibial vein: Secondary | ICD-10-CM

## 2019-08-31 DIAGNOSIS — G8918 Other acute postprocedural pain: Secondary | ICD-10-CM

## 2019-08-31 DIAGNOSIS — L02415 Cutaneous abscess of right lower limb: Secondary | ICD-10-CM

## 2019-08-31 DIAGNOSIS — I10 Essential (primary) hypertension: Secondary | ICD-10-CM

## 2019-08-31 DIAGNOSIS — D62 Acute posthemorrhagic anemia: Secondary | ICD-10-CM

## 2019-08-31 DIAGNOSIS — E1165 Type 2 diabetes mellitus with hyperglycemia: Secondary | ICD-10-CM

## 2019-08-31 DIAGNOSIS — Z09 Encounter for follow-up examination after completed treatment for conditions other than malignant neoplasm: Secondary | ICD-10-CM

## 2019-08-31 LAB — GLUCOSE, POCT (MANUAL RESULT ENTRY): POC Glucose: 158 mg/dl — AB (ref 70–99)

## 2019-08-31 MED ORDER — RIVAROXABAN 20 MG PO TABS: 20.0000 mg | ORAL_TABLET | Freq: Every day | ORAL | 1 refills | Status: DC

## 2019-08-31 MED ORDER — METHOCARBAMOL 500 MG PO TABS: 1000.0000 mg | ORAL_TABLET | Freq: Four times a day (QID) | ORAL | 1 refills | Status: DC

## 2019-08-31 MED ORDER — GABAPENTIN 300 MG PO CAPS: 300.0000 mg | ORAL_CAPSULE | Freq: Three times a day (TID) | ORAL | 1 refills | Status: DC

## 2019-08-31 MED ORDER — "BD INSULIN SYRINGE ULTRAFINE 31G X 15/64"" 1 ML MISC": 4 refills | Status: DC

## 2019-08-31 MED ORDER — INSULIN ASPART 100 UNIT/ML FLEXPEN: 8.0000 [IU] | PEN_INJECTOR | Freq: Three times a day (TID) | SUBCUTANEOUS | 11 refills | Status: DC

## 2019-08-31 MED ORDER — INSULIN NPH (HUMAN) (ISOPHANE) 100 UNIT/ML ~~LOC~~ SUSP: 28.0000 [IU] | Freq: Two times a day (BID) | SUBCUTANEOUS | 11 refills | Status: DC

## 2019-08-31 MED ORDER — HYDRALAZINE HCL 25 MG PO TABS: 25.0000 mg | ORAL_TABLET | Freq: Two times a day (BID) | ORAL | 6 refills | Status: DC

## 2019-08-31 MED FILL — TRUEPLUS SYR 0.5ML 31GX5/16: 31G X 5/16" | 25 days supply | Qty: 100 | Fill #0

## 2019-08-31 MED FILL — METHOCARBAMOL 500 MG TABS: 500 | 15 days supply | Qty: 120 | Fill #0

## 2019-08-31 MED FILL — hydrALAZINE HCL 25 MG TABS: 25 | 30 days supply | Qty: 60 | Fill #0

## 2019-08-31 MED FILL — !NOVOLOG FLEXPEN SYRINGE 1: 100/ML | 25 days supply | Qty: 6 | Fill #0

## 2019-08-31 MED FILL — ?HUMULIN N 100 UNITS/ML VIA: 100 | 17 days supply | Qty: 10 | Fill #0

## 2019-08-31 NOTE — Patient Instructions (Signed)
Continue current doses of insulin.  Continue to monitor your blood sugars.  Continue iron supplement daily.  This can be purchased over-the-counter as ferrous sulfate 65 mg.  Please call and schedule an appointment with Dr. Doreatha Martin the orthopedic surgeon [phone]), Dr. Karolee Ohs the infectious disease specialist [phone]) and the physical medicine and rehabilitation Dr. Dagoberto Ligas or one of her partners [phone]).

## 2019-08-31 NOTE — Progress Notes (Signed)
Patient ID: Bradley Cox, male    DOB: 09/14/1971  MRN: [id number]  CC: Hospitalization Follow-up   Subjective: Bradley Cox is a 48 y.o. male who presents for new pt visit.  Mother is with him. His concerns today include:  Patient with history of DM type II, obesity, HTN, anemia due to acute blood loss, DVT right leg, asthma. MSSA bacteremia  Previous PCP was at [facility].  He was seen there about 3 times, last seen in August of this year.  He decided to change physicians because he felt he was misdiagnosed and given Toradol shot for pain and later found to have abscesses.  Patient hospitalized 9/3-10/15/2020 with sepsis.  Found to have MSSA infection with L3-S1 paraspinal abscess, right iliopsoas and right posterior thigh abscesses.  He underwent serial debridement by orthopedics.  At one point he had a wound VAC.  Seen by ID who recommended 6 to 8 weeks of Ancef.  This was completed on 08/27/2019.  Patient had a lot of serosanguineous discharge from the wound and patient was placed on compression wraps/bandages to the right lower extremity.  Hospital course was complicated by right lower extremity DVT.  He was started on Xarelto.  Pain was difficult to control.  He was placed on Dilaudid and OxyContin.  He also developed anemia due to acute blood loss.  He was transfused x2. Found to have new onset diabetes.  He was started on NPH insulin and NovoLog. Patient transferred to inpatient rehab on 08/16/2019 and discharged on 08/29/2019.  Today: Paraspinal/right iliopsoas and posterior thigh abscesses: He has completed antibiotics.  He does dressing changes twice a day.  Both he and his mother report that he still has a lot of drainage.  Most of the drainage is from the incision wound that is behind the right knee.  He has not scheduled an appointment as yet with the orthopedics Dr. Verlon Setting.  Plans to call today to schedule.  He is taking OxyContin 40 mg twice a day and Dilaudid as  needed.  He states he does not intend to remain on the medications long-term but that the pain was unbearable during the hospitalization.  Pain is a little better.  He finds the Robaxin most helpful and has to take it 4 times a day.  He will be starting home PT next week.  He ambulates with a rolling walker.  He lives with his father.  His mother comes over to help him change his dressings.  DVT right leg: No previous history.  He is on Xarelto.  No bruising or bleeding.  Last H&H before discharge from the hospital was 7.7/25.  He is taking iron supplement daily.  DM: New diagnosis on hospitalization.  Reports taking Novolin and 28 units twice a day and NovoLog 8 units with meals.  Checks blood sugars 3 times a day.  Lowest was 128.  Highest blood sugar reading 154.  Did receive some diabetic teaching and nutritional teaching while in hospital.  Hypertension.  He is on hydralazine and taking consistently.  Past medical history, surgical history, social history, family history reviewed.  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   Patient Active Problem List   Diagnosis Date Noted  . Hyponatremia   . Drug induced constipation   . Uncontrolled type 2 diabetes mellitus with hyperglycemia ([place])   . Acute blood loss anemia   . Postoperative pain   . Acute DVT (deep venous thrombosis) (HCC)  07/29/2019  . MSSA bacteremia 07/10/2019  . Abscess of right hip 07/09/2019  . Paraspinal abscess ([place]) 07/09/2019  . 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 Sepsis ([place]) 07/06/2019  . AKI (acute kidney injury) ([place]) 07/06/2019  . Hyperkalemia 07/06/2019  . Obesity, Class III, BMI 40-49.9 (morbid obesity) ([place]) 01/21/2009  . GERD 01/21/2009  . DYSPHAGIA 01/21/2009  . Essential hypertension 01/07/2009  . ASTHMA 01/07/2009     Current Outpatient Medications on File Prior to Visit  Medication Sig Dispense Refill  . acetaminophen (TYLENOL) 325 MG tablet Take 2 tablets  (650 mg total) by mouth every 6 (six) hours as needed for mild pain.    [name] Kitchen albuterol (VENTOLIN HFA) 108 (90 Base) MCG/ACT inhaler Inhale 1 puff into the lungs every 6 (six) hours as needed for wheezing or shortness of breath.     . gabapentin (NEURONTIN) 300 MG capsule Take 1 capsule (300 mg total) by mouth 3 (three) times daily. 90 capsule 0  . hydrALAZINE (APRESOLINE) 25 MG tablet Take 1 tablet (25 mg total) by mouth 2 (two) times daily. 60 tablet 0  . HYDROmorphone (DILAUDID) 4 MG tablet Take 1 tablet (4 mg total) by mouth every 4 (four) hours as needed for severe pain. 30 tablet 0  . insulin aspart (NOVOLOG) 100 UNIT/ML FlexPen Inject 8 Units into the skin 3 (three) times daily with meals. 15 mL 11  . insulin NPH Human (NOVOLIN N) 100 UNIT/ML injection Inject 0.28 mLs (28 Units total) into the skin 2 (two) times daily at 8 am and 10 pm. 10 mL 11  . iron polysaccharides (NIFEREX) 150 MG capsule Take 1 capsule (150 mg total) by mouth daily. 30 capsule 0  . methocarbamol (ROBAXIN) 500 MG tablet Take 2 tablets (1,000 mg total) by mouth 4 (four) times daily. 120 tablet 0  . Multiple Vitamin (THERA) TABS Take 2 tablets by mouth daily.    [name] Kitchen oxyCODONE (OXYCONTIN) 40 mg 12 hr tablet Take 1 tablet (40 mg total) by mouth every 12 (twelve) hours for 5 days. 14 tablet 0  . polyethylene glycol (MIRALAX / GLYCOLAX) 17 g packet Take 17 g by mouth 2 (two) times daily. 14 each 0  . rivaroxaban (XARELTO) 20 MG TABS tablet Take 1 tablet (20 mg total) by mouth daily with supper. 30 tablet 1  . senna-docusate (SENOKOT-S) 8.6-50 MG tablet Take 1 tablet by mouth 2 (two) times daily.     No current facility-administered medications on file prior to visit.     Allergies  Allergen Reactions  . Toradol [Ketorolac Tromethamine]     SOB, sweating. Tolerates Ibuprofen. Wife reports reaction may have been due to injection.    Social History   Socioeconomic History  . Marital status: Single    Spouse name: Not on file   . Number of children: Not on file  . Years of education: Not on file  . Highest education level: Not on file  Occupational History  . Not on file  Social Needs  . Financial resource strain: Not on file  . Food insecurity    Worry: Not on file    Inability: Not on file  . Transportation needs    Medical: Not on file    Non-medical: Not on file  Tobacco Use  . Smoking status: Never Smoker  . Smokeless tobacco: Current User    Types: Snuff  Substance and Sexual Activity  . Alcohol use: Yes    Comment: rarely  . Drug use:  Never  . Sexual activity: Not on file  Lifestyle  . Physical activity    Days per week: Not on file    Minutes per session: Not on file  . Stress: Not on file  Relationships  . Social Herbalist on phone: Not on file    Gets together: Not on file    Attends religious service: Not on file    Active member of club or organization: Not on file    Attends meetings of clubs or organizations: Not on file    Relationship status: Not on file  . Intimate partner violence    Fear of current or ex partner: Not on file    Emotionally abused: Not on file    Physically abused: Not on file    Forced sexual activity: Not on file  Other Topics Concern  . Not on file  Social History Narrative  . Not on file    Family History  Problem Relation Age of Onset  . Diabetes Paternal Grandmother     Past Surgical History:  Procedure Laterality Date  . APPLICATION OF WOUND VAC Right 07/17/2019   Procedure: Application Of Wound Vac;  Surgeon: Altamese Hernando [place], MD;  Location: [place];  Service: Orthopedics;  Laterality: Right;  . APPLICATION OF WOUND VAC Right 07/20/2019   Procedure: Application Of Wound Vac;  Surgeon: Altamese Ames, MD;  Location: [place];  Service: Orthopedics;  Laterality: Right;  . APPLICATION OF WOUND VAC Right 07/27/2019   Procedure: Application Of Wound Vac;  Surgeon: Shona Needles, MD;  Location: [place];  Service: Orthopedics;  Laterality:  Right;  . HERNIA REPAIR     As a baby  . I&D EXTREMITY Right 07/27/2019   Procedure: IRRIGATION AND DEBRIDEMENT EXTREMITY Right Leg;  Surgeon: Shona Needles, MD;  Location: [place];  Service: Orthopedics;  Laterality: Right;  . I&D EXTREMITY Right 07/30/2019   Procedure: IRRIGATION AND DEBRIDEMENT EXTREMITY WITH WOUND VAC CHANGE;  Surgeon: Shona Needles, MD;  Location: [place];  Service: Orthopedics;  Laterality: Right;  IRRIGATION AND DEBRIDEMENT EXTREMITY WITH WOUND VAC CHANGE  . I&D EXTREMITY Right 08/08/2019   Procedure: IRRIGATION AND DEBRIDEMENT RIGHT EXTREMITY;  Surgeon: Shona Needles, MD;  Location: [place];  Service: Orthopedics;  Laterality: Right;  . INCISION AND DRAINAGE ABSCESS Right 07/09/2019   Procedure: INCISION AND DRAINAGE ABSCESS RIGHT HIP/PELVIS;  Surgeon: Shona Needles, MD;  Location: [place];  Service: Orthopedics;  Laterality: Right;  . INCISION AND DRAINAGE HIP Right 07/17/2019   Procedure: IRRIGATION AND DEBRIDEMENT HIP;  Surgeon: Altamese Uehling, MD;  Location: [place];  Service: Orthopedics;  Laterality: Right;  . INCISION AND DRAINAGE HIP Right 07/20/2019   Procedure: Irrigation And Debridement Hip;  Surgeon: Altamese [place], MD;  Location: [place];  Service: Orthopedics;  Laterality: Right;  . TEE WITHOUT CARDIOVERSION N/A 07/11/2019   Procedure: TRANSESOPHAGEAL ECHOCARDIOGRAM (TEE);  Surgeon: Sanda Klein, MD;  Location: [facility];  Service: Cardiovascular;  Laterality: N/A;    ROS: Review of Systems Negative except as stated above  PHYSICAL EXAM: BP 130/74   Pulse (!) 101   Temp 97.9 F (36.6 C) (Oral)   Resp 16   Ht 6\' 6"  (1.981 m)   Wt (!) 340 lb 12.8 oz (154.6 kg)   SpO2 95%   BMI 39.38 kg/m   Physical Exam  General appearance - alert, well appearing, middle-age obese Caucasian male and in no distress Mental status - normal mood,  behavior, speech, dress, motor activity, and thought processes Eyes - pupils equal and reactive, extraocular eye movements  intact Nose - normal and patent, no erythema, discharge or polyps Mouth - mucous membranes moist, pharynx normal without lesions Neck - supple, no significant adenopathy Chest - clear to auscultation, no wheezes, rales or rhonchi, symmetric air entry Heart - normal rate, regular rhythm, normal S1, S2, no murmurs, rubs, clicks or gallops Extremities -some swelling noted in the right lower leg.  Some bilateral ankle edema. Skin -incision wound below the right buttock is healed except for two 1 cm fleshy areas that have slight serosanguineous drainage. Dressing/compression wrap removed from right thigh.  He has a long incision from the posterior upper thigh extending to below the knee.  He has 2-3 fleshy areas that are draining serosanguineous fluid.  His dressing was soaked.  No surrounding erythema.  CMP Latest Ref Rng & Units 08/27/2019 08/23/2019 08/20/2019  Glucose 70 - 99 mg/dL 146(H) 120(H) 118(H)  BUN 6 - 20 mg/dL <5(L) 5(L) 5(L)  Creatinine 0.61 - 1.24 mg/dL 0.57(L) 0.44(L) 0.54(L)  Sodium 135 - 145 mmol/L 137 135 136  Potassium 3.5 - 5.1 mmol/L 3.7 3.8 3.7  Chloride 98 - 111 mmol/L 100 98 99  CO2 22 - 32 mmol/L 28 27 30   Calcium 8.9 - 10.3 mg/dL 8.7(L) 8.6(L) 8.7(L)  Total Protein 6.5 - 8.1 g/dL 6.2(L) 6.2(L) 6.5  Total Bilirubin 0.3 - 1.2 mg/dL <0.1(L) 0.2(L) 0.2(L)  Alkaline Phos 38 - 126 U/L 210(H) 54 63  AST 15 - 41 U/L 31 13(L) 12(L)  ALT 0 - 44 U/L 31 9 10    Lipid Panel     Component Value Date/Time   CHOL 108 07/28/2019 0553   TRIG 130 07/28/2019 0553   HDL 18 (L) 07/28/2019 0553   CHOLHDL 6.0 07/28/2019 0553   VLDL 26 07/28/2019 0553   LDLCALC 64 07/28/2019 0553    CBC    Component Value Date/Time   WBC 4.0 08/27/2019 0344   RBC 2.75 (L) 08/27/2019 0344   HGB 7.5 (L) 08/27/2019 0344   HCT 25.8 (L) 08/27/2019 0344   PLT 237 08/27/2019 0344   MCV 93.8 08/27/2019 0344   MCH 27.3 08/27/2019 0344   MCHC 29.1 (L) 08/27/2019 0344   RDW 14.6 08/27/2019 0344    LYMPHSABS 1.5 08/27/2019 0344   MONOABS 0.5 08/27/2019 0344   EOSABS 0.3 08/27/2019 0344   BASOSABS 0.0 08/27/2019 0344   Lab Results  Component Value Date   HGBA1C 12.6 (H) 07/07/2019     ASSESSMENT AND PLAN: 1. Hospital discharge follow-up 2. Abscess of right hip 3. Paraspinal abscess (HCC) Abscess of the right thigh Advised patient to call today and schedule a follow-up appointment with the orthopedic specialist and with infectious disease specialist Dr. Graylon Good. -I also recommend scheduling a follow-up appointment with PMR to assist with pain management since we only prescribe Tylenol 3's at this facility.   Once he is healed more, he would have to be weaned off of OxyContin and Dilaudid Continue dressing changes. Patient given forms to apply for the orange card/cone discount - methocarbamol (ROBAXIN) 500 MG tablet; Take 2 tablets (1,000 mg total) by mouth 4 (four) times daily.  Dispense: 120 tablet; Refill: 1  4. Postoperative pain See discussion under #3 above. - gabapentin (NEURONTIN) 300 MG capsule; Take 1 capsule (300 mg total) by mouth 3 (three) times daily.  Dispense: 90 capsule; Refill: 1  5. Uncontrolled type 2 diabetes mellitus with hyperglycemia (  [place]) Reported blood sugars are good.  Advised to continue current dose of NPH and NovoLog insulin.  Encouraged to continue to monitor blood sugars. Advised to get an eye exam once a year - POCT glucose (manual entry) - Microalbumin / creatinine urine ratio - insulin NPH Human (NOVOLIN N) 100 UNIT/ML injection; Inject 0.28 mLs (28 Units total) into the skin 2 (two) times daily at 8 am and 10 pm.  Dispense: 10 mL; Refill: 11 - insulin aspart (NOVOLOG) 100 UNIT/ML FlexPen; Inject 8 Units into the skin 3 (three) times daily with meals.  Dispense: 15 mL; Refill: 11 - Insulin Syringe-Needle U-100 (BD INSULIN SYRINGE ULTRAFINE) 31G X 15/64" 1 ML MISC; Use as directed  Dispense: 100 each; Refill: 4  6. Essential hypertension At  goal. - hydrALAZINE (APRESOLINE) 25 MG tablet; Take 1 tablet (25 mg total) by mouth 2 (two) times daily.  Dispense: 60 tablet; Refill: 6  7. Acute blood loss anemia Recheck CBC today.  Continue iron supplement.  Mother states she will purchase for him over-the-counter. - CBC  8. Acute deep vein thrombosis (DVT) of tibial vein of right lower extremity ([place]) Patient did have a provoking event.  Plan would be to continue Xarelto for total of 3 months.  Advised to report any bleeding or excessive bruising. - rivaroxaban (XARELTO) 20 MG TABS tablet; Take 1 tablet (20 mg total) by mouth daily with supper.  Dispense: 30 tablet; Refill: 1    Patient was given the opportunity to ask questions.  Patient verbalized understanding of the plan and was able to repeat key elements of the plan.   Orders Placed This Encounter  Procedures  . Microalbumin / creatinine urine ratio  . POCT glucose (manual entry)     Requested Prescriptions    No prescriptions requested or ordered in this encounter    No follow-ups on file.  Karle Plumber, MD, FACP

## 2019-09-01 LAB — MICROALBUMIN / CREATININE URINE RATIO
Creatinine, Urine: 325.7 mg/dL
Microalb/Creat Ratio: 26 mg/g creat (ref 0–29)
Microalbumin, Urine: 84.6 ug/mL

## 2019-09-01 LAB — CBC
Hematocrit: 33.9 % — ABNORMAL LOW (ref 37.5–51.0)
Hemoglobin: 10.7 g/dL — ABNORMAL LOW (ref 13.0–17.7)
MCH: 27 pg (ref 26.6–33.0)
MCHC: 31.6 g/dL (ref 31.5–35.7)
MCV: 85 fL (ref 79–97)
Platelets: 469 10*3/uL — ABNORMAL HIGH (ref 150–450)
RBC: 3.97 x10E6/uL — ABNORMAL LOW (ref 4.14–5.80)
RDW: 14 % (ref 11.6–15.4)
WBC: 10.6 10*3/uL (ref 3.4–10.8)

## 2019-09-04 ENCOUNTER — Telehealth: Payer: Self-pay | Admitting: Internal Medicine

## 2019-09-04 ENCOUNTER — Telehealth: Payer: Self-pay

## 2019-09-04 NOTE — Telephone Encounter (Signed)
Physical therapist Eulas Post from [facility] health called requesting verbal orders for Physical therapy  -once every other week for 8 weeks Please follow up  [phone] p

## 2019-09-04 NOTE — Telephone Encounter (Signed)
Contacted pt to go over lab results pt is aware and doesn't have any questions or concerns 

## 2019-09-04 NOTE — Telephone Encounter (Signed)
Returned [employer] call and gave verbal orders

## 2019-09-06 ENCOUNTER — Telehealth: Payer: Self-pay | Admitting: *Deleted

## 2019-09-06 NOTE — Telephone Encounter (Signed)
Fredderick Erb, PT, [facility] left a message asking for verbal orders for HHPT 1everyotherweek8. Medical record reviewed. Social work note reviewed.  Verbal orders given per office protocol.

## 2019-09-24 MED FILL — METHOCARBAMOL 500 MG TABS: 500 | 15 days supply | Qty: 120 | Fill #1

## 2019-10-09 ENCOUNTER — Telehealth: Payer: Self-pay | Admitting: Internal Medicine

## 2019-10-09 DIAGNOSIS — L02415 Cutaneous abscess of right lower limb: Secondary | ICD-10-CM

## 2019-10-09 MED ORDER — METHOCARBAMOL 500 MG PO TABS: 1000.0000 mg | ORAL_TABLET | Freq: Four times a day (QID) | ORAL | 0 refills | Status: DC

## 2019-10-09 MED FILL — METHOCARBAMOL 500 MG TABS: 500 | 7 days supply | Qty: 60 | Fill #0

## 2019-10-09 NOTE — Telephone Encounter (Signed)
Rx sent to hold until patient sees Dr. Wynetta Emery next week.

## 2019-10-09 NOTE — Telephone Encounter (Signed)
1) Medication(s) Requested (by name): methocarbamol (ROBAXIN) 500 MG tablet [id number]   2) Pharmacy of Choice: [email or web address]   3) Special Requests:   Approved medications will be sent to the pharmacy, we will reach out if there is an issue.  Requests made after 3pm may not be addressed until the following business day!  If a patient is unsure of the name of the medication(s) please note and ask patient to call back when they are able to provide all info, do not send to responsible party until all information is available!

## 2019-10-19 ENCOUNTER — Ambulatory Visit: Payer: Self-pay | Attending: Internal Medicine | Admitting: Internal Medicine

## 2019-10-19 ENCOUNTER — Encounter: Payer: Self-pay | Admitting: Internal Medicine

## 2019-10-19 ENCOUNTER — Other Ambulatory Visit: Payer: Self-pay

## 2019-10-19 VITALS — BP 125/88 | HR 94 | Resp 16 | Wt 328.0 lb

## 2019-10-19 DIAGNOSIS — D649 Anemia, unspecified: Secondary | ICD-10-CM

## 2019-10-19 DIAGNOSIS — F321 Major depressive disorder, single episode, moderate: Secondary | ICD-10-CM

## 2019-10-19 DIAGNOSIS — E1165 Type 2 diabetes mellitus with hyperglycemia: Secondary | ICD-10-CM

## 2019-10-19 DIAGNOSIS — E119 Type 2 diabetes mellitus without complications: Secondary | ICD-10-CM

## 2019-10-19 DIAGNOSIS — Z794 Long term (current) use of insulin: Secondary | ICD-10-CM

## 2019-10-19 DIAGNOSIS — E669 Obesity, unspecified: Secondary | ICD-10-CM

## 2019-10-19 DIAGNOSIS — L02415 Cutaneous abscess of right lower limb: Secondary | ICD-10-CM

## 2019-10-19 LAB — POCT GLYCOSYLATED HEMOGLOBIN (HGB A1C): HbA1c, POC (controlled diabetic range): 6.7 % (ref 0.0–7.0)

## 2019-10-19 LAB — GLUCOSE, POCT (MANUAL RESULT ENTRY): POC Glucose: 108 mg/dl — AB (ref 70–99)

## 2019-10-19 MED ORDER — TRUE METRIX METER W/DEVICE KIT: PACK | 0 refills | Status: DC

## 2019-10-19 MED ORDER — DULOXETINE HCL 20 MG PO CPEP: 20.0000 mg | ORAL_CAPSULE | Freq: Every day | ORAL | 1 refills | Status: DC

## 2019-10-19 MED ORDER — TRUEPLUS LANCETS 28G MISC: 4 refills | Status: DC

## 2019-10-19 MED ORDER — TRUE METRIX BLOOD GLUCOSE TEST VI STRP: ORAL_STRIP | 12 refills | Status: DC

## 2019-10-19 MED FILL — TRUEplus LANCETS 28G MISC: 25 days supply | Qty: 100 | Fill #0

## 2019-10-19 MED FILL — TRUE METRIX TEST STRIP: 25 days supply | Qty: 100 | Fill #0

## 2019-10-19 MED FILL — DULoxetine HCL 20 MG CPEP: 20 | 30 days supply | Qty: 30 | Fill #0

## 2019-10-19 MED FILL — !TRUE METRIX BLOOD GLUCOSE: 1 days supply | Qty: 1 | Fill #0

## 2019-10-19 MED FILL — METHOCARBAMOL 500 MG TABS: 500 | 7 days supply | Qty: 60 | Fill #0

## 2019-10-19 NOTE — Progress Notes (Signed)
Patient ID: Bradley Cox, male    DOB: 04/15/1971  MRN: [id number]  CC: Follow-up   Subjective: Bradley Cox is a 48 y.o. male who presents for chronic ds management.  Mom is with him His concerns today include:  Patient with history of DM type II, obesity, HTN, anemia due to acute blood loss, DVT right leg, asthma. MSSA bacteremia  Since last visit with me, patient has seen the orthopedic specialist Dr. Verlon Setting x2.  He reports that the surgical site on his right posterior thigh is healing.  He has 1 small area that still remains open but it has decreased in size.  It still drains though not as much as before.  He has to change dressing about twice a day.  For pain he takes Dilaudid once a day.  The Xarelto was discontinued earlier this month.  DM: He checks his blood sugars daily.  However he is not sure whether his machine is accurate.  The machine is old.  States that blood sugar ranges before meals have been 180-190.  The lowest has been 146.  He has lost an additional 12 pounds since last visit.  He thinks his eating habits are okay.  Complains of feeling depressed given everything that has happened.  He has been seeing a therapist.  He admits to crying spells, feeling down.  He also admits that he has felt suicidal a few times.  When asked whether he had a plan, patient tells me that he has a 9 mm gun that he keeps in his recliner chair.  However he states that he is not suicidal at this time and would never do anything like that because he knows it would hurt his kids and everybody.  His therapist recommended that he speak with me about getting on an antidepressant.  Some problems sleeping as well Patient Active Problem List   Diagnosis Date Noted  . Hyponatremia   . Drug induced constipation   . Uncontrolled type 2 diabetes mellitus with hyperglycemia ([place])   . Acute blood loss anemia   . Postoperative pain   . Acute DVT (deep venous thrombosis) ([place]) 07/29/2019  . MSSA  bacteremia 07/10/2019  . Abscess of right hip 07/09/2019  . Paraspinal abscess ([place]) 07/09/2019  . 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 Sepsis ([place]) 07/06/2019  . AKI (acute kidney injury) ([place]) 07/06/2019  . Hyperkalemia 07/06/2019  . Obesity, Class III, BMI 40-49.9 (morbid obesity) ([place]) 01/21/2009  . GERD 01/21/2009  . DYSPHAGIA 01/21/2009  . Essential hypertension 01/07/2009  . ASTHMA 01/07/2009     Current Outpatient Medications on File Prior to Visit  Medication Sig Dispense Refill  . acetaminophen (TYLENOL) 325 MG tablet Take 2 tablets (650 mg total) by mouth every 6 (six) hours as needed for mild pain.    [name] Kitchen albuterol (VENTOLIN HFA) 108 (90 Base) MCG/ACT inhaler Inhale 1 puff into the lungs every 6 (six) hours as needed for wheezing or shortness of breath.     . gabapentin (NEURONTIN) 300 MG capsule Take 1 capsule (300 mg total) by mouth 3 (three) times daily. 90 capsule 1  . hydrALAZINE (APRESOLINE) 25 MG tablet Take 1 tablet (25 mg total) by mouth 2 (two) times daily. 60 tablet 6  . HYDROmorphone (DILAUDID) 4 MG tablet Take 1 tablet (4 mg total) by mouth every 4 (four) hours as needed for severe pain. 30 tablet 0  . insulin aspart (NOVOLOG) 100 UNIT/ML FlexPen Inject 8 Units into the  skin 3 (three) times daily with meals. 15 mL 11  . insulin NPH Human (NOVOLIN N) 100 UNIT/ML injection Inject 0.28 mLs (28 Units total) into the skin 2 (two) times daily at 8 am and 10 pm. 10 mL 11  . Insulin Syringe-Needle U-100 (BD INSULIN SYRINGE ULTRAFINE) 31G X 15/64" 1 ML MISC Use as directed 100 each 4  . iron polysaccharides (NIFEREX) 150 MG capsule Take 1 capsule (150 mg total) by mouth daily. 30 capsule 0  . methocarbamol (ROBAXIN) 500 MG tablet Take 2 tablets (1,000 mg total) by mouth 4 (four) times daily. 60 tablet 0  . Multiple Vitamin (THERA) TABS Take 2 tablets by mouth daily.    . polyethylene glycol (MIRALAX / GLYCOLAX) 17 g packet Take 17 g by mouth 2 (two) times daily.  14 each 0  . rivaroxaban (XARELTO) 20 MG TABS tablet Take 1 tablet (20 mg total) by mouth daily with supper. 30 tablet 1  . senna-docusate (SENOKOT-S) 8.6-50 MG tablet Take 1 tablet by mouth 2 (two) times daily.     No current facility-administered medications on file prior to visit.    Allergies  Allergen Reactions  . Toradol [Ketorolac Tromethamine]     SOB, sweating. Tolerates Ibuprofen. Wife reports reaction may have been due to injection.    Social History   Socioeconomic History  . Marital status: Single    Spouse name: Not on file  . Number of children: Not on file  . Years of education: Not on file  . Highest education level: Not on file  Occupational History  . Not on file  Tobacco Use  . Smoking status: Never Smoker  . Smokeless tobacco: Current User    Types: Snuff  Substance and Sexual Activity  . Alcohol use: Yes    Comment: rarely  . Drug use: Never  . Sexual activity: Not on file  Other Topics Concern  . Not on file  Social History Narrative  . Not on file   Social Determinants of Health   Financial Resource Strain:   . Difficulty of Paying Living Expenses: Not on file  Food Insecurity:   . Worried About Charity fundraiser in the Last Year: Not on file  . Ran Out of Food in the Last Year: Not on file  Transportation Needs:   . Lack of Transportation (Medical): Not on file  . Lack of Transportation (Non-Medical): Not on file  Physical Activity:   . Days of Exercise per Week: Not on file  . Minutes of Exercise per Session: Not on file  Stress:   . Feeling of Stress : Not on file  Social Connections:   . Frequency of Communication with Friends and Family: Not on file  . Frequency of Social Gatherings with Friends and Family: Not on file  . Attends Religious Services: Not on file  . Active Member of Clubs or Organizations: Not on file  . Attends Archivist Meetings: Not on file  . Marital Status: Not on file  Intimate Partner Violence:    . Fear of Current or Ex-Partner: Not on file  . Emotionally Abused: Not on file  . Physically Abused: Not on file  . Sexually Abused: Not on file    Family History  Problem Relation Age of Onset  . Diabetes Paternal Grandmother     Past Surgical History:  Procedure Laterality Date  . APPLICATION OF WOUND VAC Right 07/17/2019   Procedure: Application Of Wound Vac;  Surgeon: Marcelino Scot,  Legrand Como, MD;  Location: [facility];  Service: Orthopedics;  Laterality: Right;  . APPLICATION OF WOUND VAC Right 07/20/2019   Procedure: Application Of Wound Vac;  Surgeon: Altamese [place], MD;  Location: [facility];  Service: Orthopedics;  Laterality: Right;  . APPLICATION OF WOUND VAC Right 07/27/2019   Procedure: Application Of Wound Vac;  Surgeon: Shona Needles, MD;  Location: [facility];  Service: Orthopedics;  Laterality: Right;  . HERNIA REPAIR     As a baby  . I & D EXTREMITY Right 07/27/2019   Procedure: IRRIGATION AND DEBRIDEMENT EXTREMITY Right Leg;  Surgeon: Shona Needles, MD;  Location: [facility];  Service: Orthopedics;  Laterality: Right;  . I & D EXTREMITY Right 07/30/2019   Procedure: IRRIGATION AND DEBRIDEMENT EXTREMITY WITH WOUND VAC CHANGE;  Surgeon: Shona Needles, MD;  Location: [facility];  Service: Orthopedics;  Laterality: Right;  IRRIGATION AND DEBRIDEMENT EXTREMITY WITH WOUND VAC CHANGE  . I & D EXTREMITY Right 08/08/2019   Procedure: IRRIGATION AND DEBRIDEMENT RIGHT EXTREMITY;  Surgeon: Shona Needles, MD;  Location: [facility];  Service: Orthopedics;  Laterality: Right;  . INCISION AND DRAINAGE ABSCESS Right 07/09/2019   Procedure: INCISION AND DRAINAGE ABSCESS RIGHT HIP/PELVIS;  Surgeon: Shona Needles, MD;  Location: [facility];  Service: Orthopedics;  Laterality: Right;  . INCISION AND DRAINAGE HIP Right 07/17/2019   Procedure: IRRIGATION AND DEBRIDEMENT HIP;  Surgeon: Altamese [place], MD;  Location: [facility];  Service: Orthopedics;  Laterality: Right;  . INCISION AND DRAINAGE HIP Right 07/20/2019   Procedure:  Irrigation And Debridement Hip;  Surgeon: Altamese , MD;  Location: [facility];  Service: Orthopedics;  Laterality: Right;  . TEE WITHOUT CARDIOVERSION N/A 07/11/2019   Procedure: TRANSESOPHAGEAL ECHOCARDIOGRAM (TEE);  Surgeon: Sanda Klein, MD;  Location: [facility];  Service: Cardiovascular;  Laterality: N/A;    ROS: Review of Systems Negative except as stated above  PHYSICAL EXAM: BP 125/88   Pulse 94   Resp 16   Wt (!) 328 lb (148.8 kg)   SpO2 95%   BMI 37.90 kg/m   Wt Readings from Last 3 Encounters:  10/19/19 (!) 328 lb (148.8 kg)  08/31/19 (!) 340 lb 12.8 oz (154.6 kg)  08/18/19 (!) 345 lb 3.9 oz (156.6 kg)    Physical Exam  General appearance - alert, well appearing, and in no distress Mental status - normal mood, behavior, speech, dress, motor activity, and thought processes Skin -wound on the right posterior thigh is about 97 percent healed except for a small opening that drains serosanguineous fluid when palpated.  Depression screen PHQ 2/9 10/19/2019  Decreased Interest 1  Down, Depressed, Hopeless 3  PHQ - 2 Score 4  Altered sleeping 3  Tired, decreased energy 3  Change in appetite 1  Feeling bad or failure about yourself  3  Trouble concentrating 2  Moving slowly or fidgety/restless 1  Suicidal thoughts 1  PHQ-9 Score 18   GAD 7 : Generalized Anxiety Score 10/19/2019  Nervous, Anxious, on Edge 3  Control/stop worrying 3  Worry too much - different things 3  Trouble relaxing 3  Restless 2  Easily annoyed or irritable 2  Afraid - awful might happen 3  Total GAD 7 Score 19     Results for orders placed or performed in visit on 10/19/19  Glucose (CBG)  Result Value Ref Range   POC Glucose 108 (A) 70 - 99 mg/dl  HgB A1c  Result Value Ref Range   Hemoglobin  A1C     HbA1c POC (<> result, manual entry)     HbA1c, POC (prediabetic range)     HbA1c, POC (controlled diabetic range) 6.7 0.0 - 7.0 %     CMP Latest Ref Rng & Units 08/27/2019  08/23/2019 08/20/2019  Glucose 70 - 99 mg/dL 146(H) 120(H) 118(H)  BUN 6 - 20 mg/dL <5(L) 5(L) 5(L)  Creatinine 0.61 - 1.24 mg/dL 0.57(L) 0.44(L) 0.54(L)  Sodium 135 - 145 mmol/L 137 135 136  Potassium 3.5 - 5.1 mmol/L 3.7 3.8 3.7  Chloride 98 - 111 mmol/L 100 98 99  CO2 22 - 32 mmol/L _0 Calcium 8.9 - 10.3 mg/dL 8.7(L) 8.6(L) 8.7(L)  Total Protein 6.5 - 8.1 g/dL 6.2(L) 6.2(L) 6.5  Total Bilirubin 0.3 - 1.2 mg/dL <0.1(L) 0.2(L) 0.2(L)  Alkaline Phos 38 - 126 U/L 210(H) 54 63  AST 15 - 41 U/L 31 13(L) 12(L)  ALT 0 - 44 U/L _1 Lipid Panel     Component Value Date/Time   CHOL 108 07/28/2019 0553   TRIG 130 07/28/2019 0553   HDL 18 (L) 07/28/2019 0553   CHOLHDL 6.0 07/28/2019 0553   VLDL 26 07/28/2019 0553   LDLCALC 64 07/28/2019 0553    CBC    Component Value Date/Time   WBC 10.6 08/31/2019 1118   WBC 4.0 08/27/2019 0344   RBC 3.97 (L) 08/31/2019 1118   RBC 2.75 (L) 08/27/2019 0344   HGB 10.7 (L) 08/31/2019 1118   HCT 33.9 (L) 08/31/2019 1118   PLT 469 (H) 08/31/2019 1118   MCV 85 08/31/2019 1118   MCH 27.0 08/31/2019 1118   MCH 27.3 08/27/2019 0344   MCHC 31.6 08/31/2019 1118   MCHC 29.1 (L) 08/27/2019 0344   RDW 14.0 08/31/2019 1118   LYMPHSABS 1.5 08/27/2019 0344   MONOABS 0.5 08/27/2019 0344   EOSABS 0.3 08/27/2019 0344   BASOSABS 0.0 08/27/2019 0344    ASSESSMENT AND PLAN: 1. Type 2 diabetes mellitus without complication, with long-term current use of insulin (HCC) A1C significantly improved and now at goal. Will get him new DM testing supplies.  Went over goal for BS before meals.  If with the new meter fasting BS still >130, we can increase NPH insulin.  Commended him on wgh loss and trying to eat better - Glucose (CBG) - HgB A1c - Blood Glucose Monitoring Suppl (TRUE METRIX METER) w/Device KIT; Use as directed  Dispense: 1 kit; Refill: 0 - glucose blood (TRUE METRIX BLOOD GLUCOSE TEST) test strip; Use as instructed  Dispense: 100 each; Refill:  12 - TRUEplus Lancets 28G MISC; Use as directed  Dispense: 100 each; Refill: 4  2. Current moderate episode of major depressive disorder without prior episode ([place]) Discuss dx and management of depression.  He will continue seeing the therapist. He is agreeable to trying an antidepressant.  I recommend Cymbalta to as it will help with pain issues also.  Will start at low dose of 20 mg and recheck him in 4-6 wks.  Pt told that it can take 4 wks before he starts to feel better on antidepressant. I also recommend that he give his gun to his mom to keep for him at least for several months until mood is more stable with therapy and  Medication.  Mother is in agreement with this.  Pt states he will think about doing so. LCSW not available to see pt today but I have sent her a message to f/u  with him. - DULoxetine (CYMBALTA) 20 MG capsule; Take 1 capsule (20 mg total) by mouth at bedtime.  Dispense: 30 capsule; Refill: 1  3. Abscess of right thigh Healing Hopefully ortho can wean him off Dilaudid as he continues to heal  4. Anemia, unspecified type Recheck CBC to see whether he still needs to be on iron - CBC  5 obesity See #1 above    Patient was given the opportunity to ask questions.  Patient verbalized understanding of the plan and was able to repeat key elements of the plan.   Orders Placed This Encounter  Procedures  . Glucose (CBG)  . HgB A1c     Requested Prescriptions    No prescriptions requested or ordered in this encounter    No follow-ups on file.  Karle Plumber, MD, FACP

## 2019-10-19 NOTE — Patient Instructions (Signed)
Start Cymbalta 20 mg daily to help with depression. If you have any increased depression or suicidal thoughts.  Please be seen in the emergency room.  Please consider giving your gun to your mother to keep for you until your depression is better.

## 2019-10-20 ENCOUNTER — Other Ambulatory Visit: Payer: Self-pay | Admitting: Internal Medicine

## 2019-10-20 DIAGNOSIS — E669 Obesity, unspecified: Secondary | ICD-10-CM | POA: Insufficient documentation

## 2019-10-20 DIAGNOSIS — F321 Major depressive disorder, single episode, moderate: Secondary | ICD-10-CM | POA: Insufficient documentation

## 2019-10-20 DIAGNOSIS — D649 Anemia, unspecified: Secondary | ICD-10-CM

## 2019-10-20 LAB — CBC
Hematocrit: 38.8 % (ref 37.5–51.0)
Hemoglobin: 11.9 g/dL — ABNORMAL LOW (ref 13.0–17.7)
MCH: 24.5 pg — ABNORMAL LOW (ref 26.6–33.0)
MCHC: 30.7 g/dL — ABNORMAL LOW (ref 31.5–35.7)
MCV: 80 fL (ref 79–97)
Platelets: 286 10*3/uL (ref 150–450)
RBC: 4.85 x10E6/uL (ref 4.14–5.80)
RDW: 14.2 % (ref 11.6–15.4)
WBC: 7.3 10*3/uL (ref 3.4–10.8)

## 2019-10-22 ENCOUNTER — Telehealth: Payer: Self-pay

## 2019-10-22 ENCOUNTER — Other Ambulatory Visit: Payer: Self-pay | Admitting: Internal Medicine

## 2019-10-22 ENCOUNTER — Telehealth: Payer: Self-pay | Admitting: Licensed Clinical Social Worker

## 2019-10-22 DIAGNOSIS — L02415 Cutaneous abscess of right lower limb: Secondary | ICD-10-CM

## 2019-10-22 MED FILL — METHOCARBAMOL 500 MG TABS: 500 | 7 days supply | Qty: 60 | Fill #0

## 2019-10-22 NOTE — Telephone Encounter (Signed)
Call placed to patient regarding IBH referral from PCP. LCSW received message stating the mailbox is full and can not accept any messages at this time.   LCSW will make additional attempts to follow up with patient.

## 2019-10-22 NOTE — Telephone Encounter (Signed)
Contacted pt to go over lab results pt is aware and doesn't have any questions or concerns 

## 2019-11-05 ENCOUNTER — Other Ambulatory Visit: Payer: Self-pay | Admitting: Internal Medicine

## 2019-11-05 DIAGNOSIS — L02415 Cutaneous abscess of right lower limb: Secondary | ICD-10-CM

## 2019-11-05 MED FILL — hydrALAZINE HCL 25 MG TABS: 25 | 30 days supply | Qty: 60 | Fill #1

## 2019-11-06 MED FILL — METHOCARBAMOL 500 MG TABS: 500 | 7 days supply | Qty: 60 | Fill #0

## 2019-11-12 ENCOUNTER — Other Ambulatory Visit: Payer: Self-pay | Admitting: Internal Medicine

## 2019-11-12 DIAGNOSIS — L02415 Cutaneous abscess of right lower limb: Secondary | ICD-10-CM

## 2019-11-12 MED FILL — DULoxetine HCL 20 MG CPEP: 20 | 30 days supply | Qty: 30 | Fill #1

## 2019-11-13 MED FILL — METHOCARBAMOL 500 MG TABS: 500 | 15 days supply | Qty: 120 | Fill #0

## 2019-11-30 ENCOUNTER — Other Ambulatory Visit: Payer: Self-pay

## 2019-11-30 ENCOUNTER — Ambulatory Visit: Payer: Self-pay | Attending: Internal Medicine | Admitting: Internal Medicine

## 2019-11-30 DIAGNOSIS — Z9181 History of falling: Secondary | ICD-10-CM

## 2019-11-30 DIAGNOSIS — F321 Major depressive disorder, single episode, moderate: Secondary | ICD-10-CM

## 2019-11-30 MED ORDER — DULOXETINE HCL 20 MG PO CPEP: 40.0000 mg | ORAL_CAPSULE | Freq: Every day | ORAL | 2 refills | Status: DC

## 2019-11-30 NOTE — Progress Notes (Signed)
Virtual Visit via Telephone Note Due to current restrictions/limitations of in-office visits due to the COVID-19 pandemic, this scheduled clinical appointment was converted to a telehealth visit  I connected with Bradley Cox on 11/30/19 at 4:22 p.m by telephone and verified that I am speaking with the correct person using two identifiers. I am in my office.  The patient is at home.  Only the patient and myself participated in this encounter.  I discussed the limitations, risks, security and privacy concerns of performing an evaluation and management service by telephone and the availability of in person appointments. I also discussed with the patient that there may be a patient responsible charge related to this service. The patient expressed understanding and agreed to proceed.   History of Present Illness: Patient with history of DM type II, obesity, HTN, anemia due to acute blood loss, DVT right leg,asthma. MSSAbacteremia.  Last seen 1.5 month ago.  Purpose of today's visit is follow-up on depression  Depression: On last visit patient expressed feelings of depression and suicidal ideation.  We had started him on Cymbalta 20 mg daily.  Patient states that he did okay on the medication but this morning was rough for him.  He woke up at 6 AM to let his dog out.  After he got back in bed he states he felt very depressed and "my mind went into overdrive.".  He feel he would benefit from an increased dose of the Cymbalta. -Our LCSW attempted to reach him but there was no answer and she could not leave a message as the mailbox was full.  Patient states that he did not recognize the number.  He is open to speaking with her  Reports that he is now walking without his cane.  If he plans to do a lot of walking or will be walking on concrete he takes his cane just in case as a standby.  He completed home physical therapy on the 24th of last month.  However he fell down the steps going into his father's  garage earlier this week.  The stairs have railings on one side and he was holding onto that but he thinks his knee gave out.  He is having some aching in the hip from this.   Observations/Objective: Depression screen [facility] 2/9 11/30/2019 10/19/2019  Decreased Interest - 1  Down, Depressed, Hopeless 3 3  PHQ - 2 Score 3 4  Altered sleeping 0 3  Tired, decreased energy 3 3  Change in appetite 1 1  Feeling bad or failure about yourself  3 3  Trouble concentrating 3 2  Moving slowly or fidgety/restless 3 1  Suicidal thoughts 0 1  PHQ-9 Score 16 18    Assessment and Plan: 1. Current moderate episode of major depressive disorder without prior episode [facility]) Patient agreeable to increase Cymbalta to 40 mg daily. I will have the LCSW try to reach out to him next week. Follow-up immediately if any increase depression or suicidal thoughts - DULoxetine (CYMBALTA) 20 MG capsule; Take 2 capsules (40 mg total) by mouth at bedtime.  Dispense: 60 capsule; Refill: 2  2. History of recent fall Advised patient to use his cane when coming down the steps to help prevent future fall.  He does not feel that he needs additional therapy at this time   Follow Up Instructions: 3 months   I discussed the assessment and treatment plan with the patient. The patient was provided an opportunity to ask questions and all  were answered. The patient agreed with the plan and demonstrated an understanding of the instructions.   The patient was advised to call back or seek an in-person evaluation if the symptoms worsen or if the condition fails to improve as anticipated.  I provided 11 minutes of non-face-to-face time during this encounter.   Karle Plumber, MD

## 2019-11-30 NOTE — Progress Notes (Signed)
Pt states he fell down the stairs Monday and hurt his knee  Pt states he woke up this morning depressed   Pt states his blood sugar this morning was 154   Pt states he has been walking without his cane for 2 weeks

## 2019-12-03 MED FILL — METHOCARBAMOL 500 MG TABS: 500 | 15 days supply | Qty: 120 | Fill #1

## 2019-12-05 MED FILL — DULoxetine HCL 20 MG CPEP: 20 | 30 days supply | Qty: 60 | Fill #0

## 2019-12-31 ENCOUNTER — Other Ambulatory Visit: Payer: Self-pay | Admitting: Internal Medicine

## 2019-12-31 DIAGNOSIS — L02415 Cutaneous abscess of right lower limb: Secondary | ICD-10-CM

## 2020-01-02 MED FILL — METHOCARBAMOL 500 MG TABS: 500 | 15 days supply | Qty: 120 | Fill #0

## 2020-01-09 ENCOUNTER — Other Ambulatory Visit: Payer: Self-pay

## 2020-01-09 ENCOUNTER — Ambulatory Visit: Payer: Self-pay | Attending: Internal Medicine | Admitting: Licensed Clinical Social Worker

## 2020-01-09 ENCOUNTER — Telehealth: Payer: Self-pay | Admitting: Licensed Clinical Social Worker

## 2020-01-09 DIAGNOSIS — F321 Major depressive disorder, single episode, moderate: Secondary | ICD-10-CM

## 2020-01-09 NOTE — BH Specialist Note (Signed)
[facility] Visit via Telemedicine (Telephone)  01/09/2020 Bradley Cox [id number]   Session Start time: 11:35 AM  Session End time: 12:05 PM Total time: 30  Referring Provider: Dr. Wynetta Emery Type of Visit: Telephonic Patient location: Home [facility] Provider location: Office All persons participating in visit: Pt and LCSW  Confirmed patient's address: Yes  Confirmed patient's phone number: Yes  Any changes to demographics: No   Confirmed patient's insurance: Yes  Any changes to patient's insurance: No   Discussed confidentiality: Yes    The following statements were read to the patient and/or legal guardian that are established with the [facility] Provider.  "The purpose of this phone visit is to provide behavioral health care while limiting exposure to the coronavirus (COVID19).  There is a possibility of technology failure and discussed alternative modes of communication if that failure occurs."  "By engaging in this telephone visit, you consent to the provision of healthcare.  Additionally, you authorize for your insurance to be billed for the services provided during this telephone visit."   Patient and/or legal guardian consented to telephone visit: Yes   PRESENTING CONCERNS: Patient and/or family reports the following symptoms/concerns: Pt reports difficulty managing physical and mental health conditions.  Duration of problem: Ongoing; Severity of problem: moderate  STRENGTHS (Protective Factors/Coping Skills): Pt resides with father and has strong support system Pt participates in medication management Pt was successful in identifying healthy coping skills (baking, playing with dog)   GOALS ADDRESSED: Patient will: 1.  Reduce symptoms of: anxiety, depression and stress  2.  Increase knowledge and/or ability of: coping skills and healthy habits  3.  Demonstrate ability to: Increase healthy adjustment to current life  circumstances  INTERVENTIONS: Interventions utilized:  Solution-Focused Strategies, Supportive Counseling and Psychoeducation and/or Health Education Standardized Assessments completed: Not Needed  ASSESSMENT: Patient currently experiencing difficulty managing mental and physical health. Medication was recently increased, pt shared "it's been helping". He receives strong support and denies SI/HI.   Patient may benefit from continued medication management. He plans to return to work in the near future and is not interested in therapy or resources at this time.   PLAN: 1. Follow up with behavioral health clinician on : Contact LCSW with any behavioral health and/or resource needs 2. Behavioral recommendations: Continue utilizing healthy coping skills and complying with medication management 3. Referral(s): [place] (In Clinic)  Rebekah [place], [place] 02/04/2020 10:14 AM

## 2020-01-09 NOTE — Telephone Encounter (Signed)
Call placed to patient twice regarding scheduled IBH appointment. LCSW was unable to leave a message due to voicemail being full.

## 2020-01-14 MED FILL — DULoxetine HCL 20 MG CPEP: 20 | 30 days supply | Qty: 60 | Fill #1

## 2020-01-30 MED FILL — METHOCARBAMOL 500 MG TABS: 500 | 15 days supply | Qty: 120 | Fill #1

## 2020-02-12 MED FILL — DULoxetine HCL 20 MG CPEP: 20 | 30 days supply | Qty: 60 | Fill #2

## 2020-02-29 ENCOUNTER — Other Ambulatory Visit: Payer: Self-pay

## 2020-02-29 ENCOUNTER — Ambulatory Visit: Payer: Self-pay | Attending: Internal Medicine | Admitting: Internal Medicine

## 2020-02-29 DIAGNOSIS — Z794 Long term (current) use of insulin: Secondary | ICD-10-CM

## 2020-02-29 DIAGNOSIS — E119 Type 2 diabetes mellitus without complications: Secondary | ICD-10-CM

## 2020-02-29 DIAGNOSIS — F5101 Primary insomnia: Secondary | ICD-10-CM | POA: Insufficient documentation

## 2020-02-29 DIAGNOSIS — I10 Essential (primary) hypertension: Secondary | ICD-10-CM

## 2020-02-29 DIAGNOSIS — F324 Major depressive disorder, single episode, in partial remission: Secondary | ICD-10-CM

## 2020-02-29 MED ORDER — TRAZODONE HCL 50 MG PO TABS: 25.0000 mg | ORAL_TABLET | Freq: Every evening | ORAL | 3 refills | Status: DC | PRN

## 2020-02-29 MED FILL — traZODone HCL 50 MG TABS: 50 | 30 days supply | Qty: 30 | Fill #0

## 2020-02-29 NOTE — Progress Notes (Signed)
Pt states his sugar was 129

## 2020-02-29 NOTE — Progress Notes (Signed)
Virtual Visit via Telephone Note Due to current restrictions/limitations of in-office visits due to the COVID-19 pandemic, this scheduled clinical appointment was converted to a telehealth visit  I connected with Jill Poling on 02/29/20 at 1:40 p.m by telephone and verified that I am speaking with the correct person using two identifiers. I am in my office.  The patient is at home.  Only the patient and myself participated in this encounter.  I discussed the limitations, risks, security and privacy concerns of performing an evaluation and management service by telephone and the availability of in person appointments. I also discussed with the patient that there may be a patient responsible charge related to this service. The patient expressed understanding and agreed to proceed.   History of Present Illness: Patient with history of DM type II, obesity, HTN, anemia due to acute blood loss, DVT right leg,asthma. MSSAbacteremia, depression.    Since we last spoke, patient reports that all the wounds on his right leg and hip have healed up and are no longer draining.  The orthopedics Dr. Verlon Setting has turned him loose.  He still gets some pain in the leg.  Most days pain is rated at 1-2/10 but sometimes pain 8/10 at nights.    DIABETES TYPE 2 Last A1C:   Results for orders placed or performed in visit on 10/19/19  CBC  Result Value Ref Range   WBC 7.3 3.4 - 10.8 x10E3/uL   RBC 4.85 4.14 - 5.80 x10E6/uL   Hemoglobin 11.9 (L) 13.0 - 17.7 g/dL   Hematocrit 38.8 37.5 - 51.0 %   MCV 80 79 - 97 fL   MCH 24.5 (L) 26.6 - 33.0 pg   MCHC 30.7 (L) 31.5 - 35.7 g/dL   RDW 14.2 11.6 - 15.4 %   Platelets 286 150 - 450 x10E3/uL  Glucose (CBG)  Result Value Ref Range   POC Glucose 108 (A) 70 - 99 mg/dl  HgB A1c  Result Value Ref Range   Hemoglobin A1C     HbA1c POC (<> result, manual entry)     HbA1c, POC (prediabetic range)     HbA1c, POC (controlled diabetic range) 6.7 0.0 - 7.0 %    Med  Adherence:  '[x]'  Yes.  Adherence with taking Novolin N and NovoLog    '[]'  No Medication side effects:  '[]'  Yes    '[x]'  No Home Monitoring?  '[x]'  Yes daily    '[]'  No Home glucose results range:120-130 Diet Adherence: '[x]'  Yes -except he likes candies.   '[]'  No Exercise: '[]'  Yes    '[]'  No Hypoglycemic episodes?: '[]'  Yes    '[x]'  No Numbness of the feet? '[]'  Yes    '[]'  No Retinopathy hx? '[]'  Yes    '[]'  No Last eye exam: over due.  No insurance Comments:   HYPERTENSION Currently taking: see medication list Med Adherence: '[x]'  Yes.  He is on hydralazine    '[]'  No Medication side effects: '[]'  Yes    '[x]'  No Adherence with salt restriction: '[x]'  Yes    '[]'  No Home Monitoring?: '[x]'  Yes    '[]'  No Monitoring Frequency: once a wk Home BP results range: good range SOB? '[]'  Yes    '[x]'  No Chest Pain?: '[]'  Yes    '[x]'  No Leg swelling?: '[]'  Yes    '[x]'  No Headaches?: '[]'  Yes    '[x]'  No Dizziness? '[]'  Yes    '[x]'  No Comments:   Depression:  doing good on Cymbalta.  States that the  depression is still there but not nearly as bad as before. Wanting RF on Ambien for insomnia.  He tells me that Dr. Doreatha Martin used to prescribe it for him because he has problems sleeping.  He has been out of it for about 2 weeks now. Gets in bed about 11 p.m.  Can take 2-3 hrs to fall asleep.  Tried Tylenol P.M and melatonin but has not found them to be helpful.  Once he gets in bed he turns off all lights and sounds.  He drinks 2 cups of coffee in the mornings none in the evenings.  Does not drink any alcoholic beverages.  HM;  Had J&J vaccine 2 Saturdays ago Outpatient Encounter Medications as of 02/29/2020  Medication Sig  . acetaminophen (TYLENOL) 325 MG tablet Take 2 tablets (650 mg total) by mouth every 6 (six) hours as needed for mild pain.  [name] Kitchen albuterol (VENTOLIN HFA) 108 (90 Base) MCG/ACT inhaler Inhale 1 puff into the lungs every 6 (six) hours as needed for wheezing or shortness of breath.   . Blood Glucose Monitoring Suppl (TRUE METRIX METER)  w/Device KIT Use as directed  . DULoxetine (CYMBALTA) 20 MG capsule Take 2 capsules (40 mg total) by mouth at bedtime.  . gabapentin (NEURONTIN) 300 MG capsule Take 1 capsule (300 mg total) by mouth 3 (three) times daily.  [name] Kitchen glucose blood (TRUE METRIX BLOOD GLUCOSE TEST) test strip Use as instructed  . hydrALAZINE (APRESOLINE) 25 MG tablet Take 1 tablet (25 mg total) by mouth 2 (two) times daily.  . insulin aspart (NOVOLOG) 100 UNIT/ML FlexPen Inject 8 Units into the skin 3 (three) times daily with meals.  . insulin NPH Human (NOVOLIN N) 100 UNIT/ML injection Inject 0.28 mLs (28 Units total) into the skin 2 (two) times daily at 8 am and 10 pm.  . Insulin Syringe-Needle U-100 (BD INSULIN SYRINGE ULTRAFINE) 31G X 15/64" 1 ML MISC Use as directed  . methocarbamol (ROBAXIN) 500 MG tablet TAKE 2 TABLETS (1,000 MG TOTAL) BY MOUTH 4 (FOUR) TIMES DAILY.  . Multiple Vitamin (THERA) TABS Take 2 tablets by mouth daily.  . TRUEplus Lancets 28G MISC Use as directed  . HYDROmorphone (DILAUDID) 4 MG tablet Take 1 tablet (4 mg total) by mouth every 4 (four) hours as needed for severe pain. (Patient not taking: Reported on 02/29/2020)  . iron polysaccharides (NIFEREX) 150 MG capsule Take 1 capsule (150 mg total) by mouth daily. (Patient not taking: Reported on 02/29/2020)  . polyethylene glycol (MIRALAX / GLYCOLAX) 17 g packet Take 17 g by mouth 2 (two) times daily. (Patient not taking: Reported on 02/29/2020)  . senna-docusate (SENOKOT-S) 8.6-50 MG tablet Take 1 tablet by mouth 2 (two) times daily. (Patient not taking: Reported on 02/29/2020)   No facility-administered encounter medications on file as of 02/29/2020.    Observations/Objective: Results for orders placed or performed in visit on 10/19/19  CBC  Result Value Ref Range   WBC 7.3 3.4 - 10.8 x10E3/uL   RBC 4.85 4.14 - 5.80 x10E6/uL   Hemoglobin 11.9 (L) 13.0 - 17.7 g/dL   Hematocrit 38.8 37.5 - 51.0 %   MCV 80 79 - 97 fL   MCH 24.5 (L) 26.6 - 33.0 pg    MCHC 30.7 (L) 31.5 - 35.7 g/dL   RDW 14.2 11.6 - 15.4 %   Platelets 286 150 - 450 x10E3/uL  Glucose (CBG)  Result Value Ref Range   POC Glucose 108 (A) 70 - 99 mg/dl  HgB A1c  Result Value Ref Range   Hemoglobin A1C     HbA1c POC (<> result, manual entry)     HbA1c, POC (prediabetic range)     HbA1c, POC (controlled diabetic range) 6.7 0.0 - 7.0 %     Assessment and Plan: 1. Type 2 diabetes mellitus without complication, with long-term current use of insulin ([place]) Reported home blood readings are at goal.  Continue current insulins.  He will come to the lab next week to have A1c done - Hemoglobin A1c; Future  2. Essential hypertension Patient reports home blood pressure readings have been good.  Continue hydralazine  3. Major depressive disorder with single episode, in partial remission ([place]) Doing well on Cymbalta  4. Primary insomnia Good sleep hygiene discussed.  We will try him on trazodone instead of Ambien. I reviewed the [place] controlled substance reporting system and I do not see prescription in the system for Ambien. - traZODone (DESYREL) 50 MG tablet; Take 0.5-1 tablets (25-50 mg total) by mouth at bedtime as needed for sleep.  Dispense: 30 tablet; Refill: 3   Follow Up Instructions: 4 months or sooner   I discussed the assessment and treatment plan with the patient. The patient was provided an opportunity to ask questions and all were answered. The patient agreed with the plan and demonstrated an understanding of the instructions.   The patient was advised to call back or seek an in-person evaluation if the symptoms worsen or if the condition fails to improve as anticipated.  I provided 15 minutes of non-face-to-face time during this encounter.   Karle Plumber, MD

## 2020-03-11 ENCOUNTER — Other Ambulatory Visit: Payer: Self-pay | Admitting: Internal Medicine

## 2020-03-11 DIAGNOSIS — L02415 Cutaneous abscess of right lower limb: Secondary | ICD-10-CM

## 2020-03-17 ENCOUNTER — Other Ambulatory Visit: Payer: Self-pay | Admitting: Internal Medicine

## 2020-03-17 DIAGNOSIS — F321 Major depressive disorder, single episode, moderate: Secondary | ICD-10-CM

## 2020-03-19 MED FILL — DULoxetine HCL 20 MG CPEP: 20 | 30 days supply | Qty: 60 | Fill #0

## 2020-04-09 IMAGING — CT CT ABD-PELV W/ CM
2 of 5 series · 13 of 46 positions shown, 15 images · IV contrast (iopamidol)
Comparison: 06/18/2019
COMPARISON: 06/18/2019

Addendum:
CLINICAL DATA: Right-sided pain for several weeks, initial
encounter

EXAM:
CT ABDOMEN AND PELVIS WITH CONTRAST
TECHNIQUE: Multidetector CT imaging of the abdomen and pelvis was performed
using the standard protocol following bolus administration of
intravenous contrast.
CONTRAST:  100mL [id number] IOPAMIDOL ([id number]) INJECTION 61%

[Series 3: a/p w/ 5mm · axial · 0.98mm/px · z∈[+959,+1489]mm · 10 of 120 slices shown, 12 images]
[im 7/120  soft-tissue]
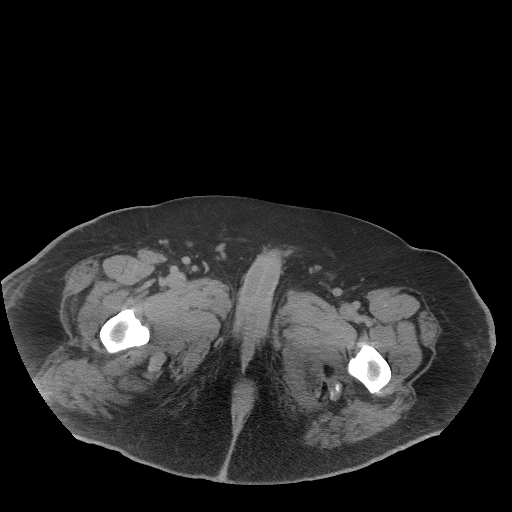
[im 7/120  bone]
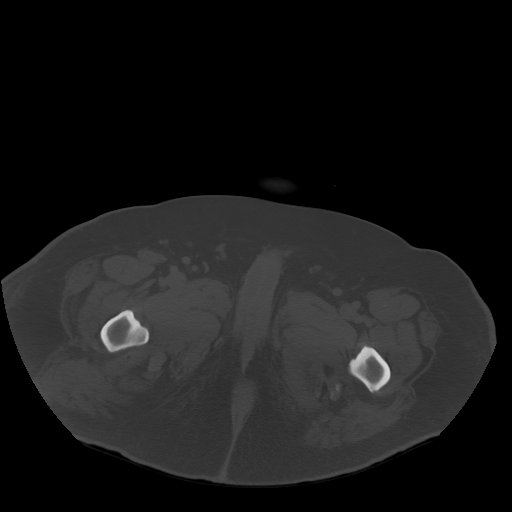
[im 20/120  soft-tissue]
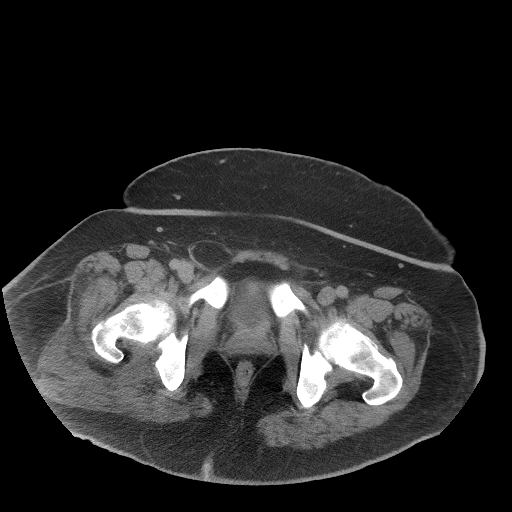
[im 34/120  soft-tissue]
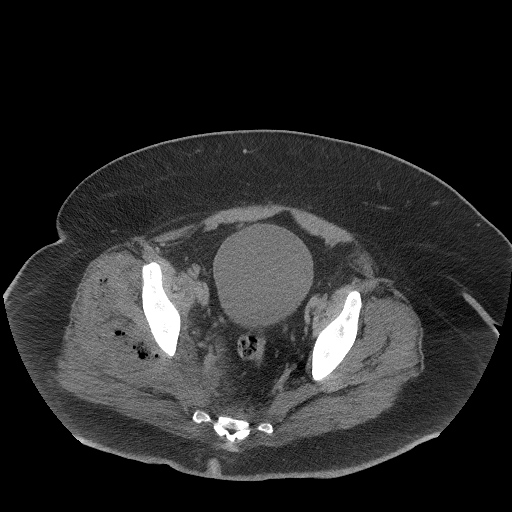
[im 40/120  soft-tissue]
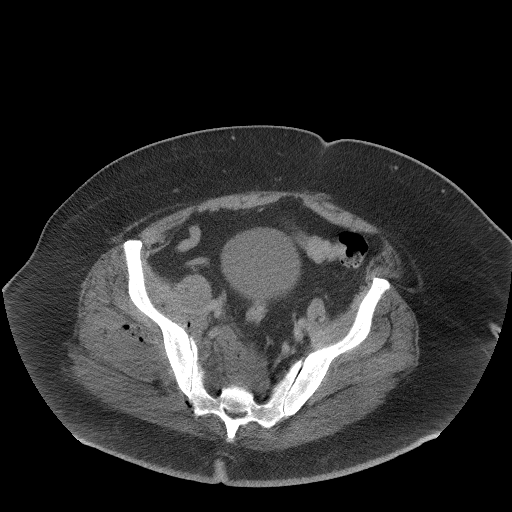
[im 53/120  soft-tissue]
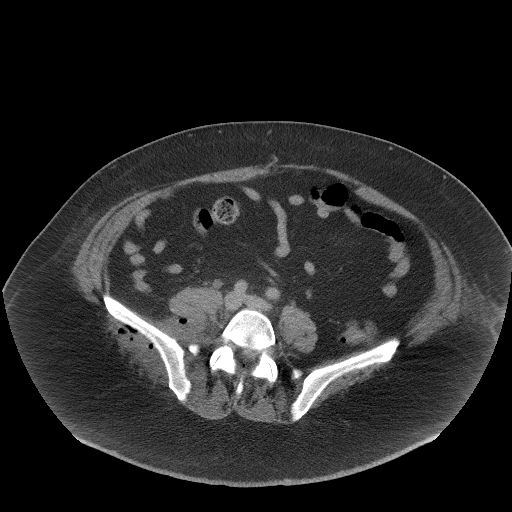
[im 67/120  soft-tissue]
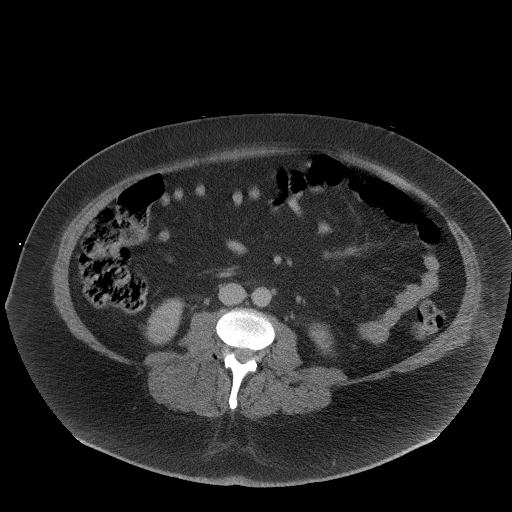
[im 80/120  soft-tissue]
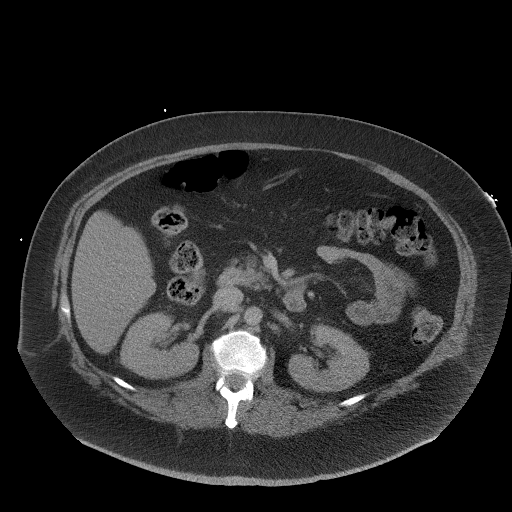
[im 86/120  soft-tissue]
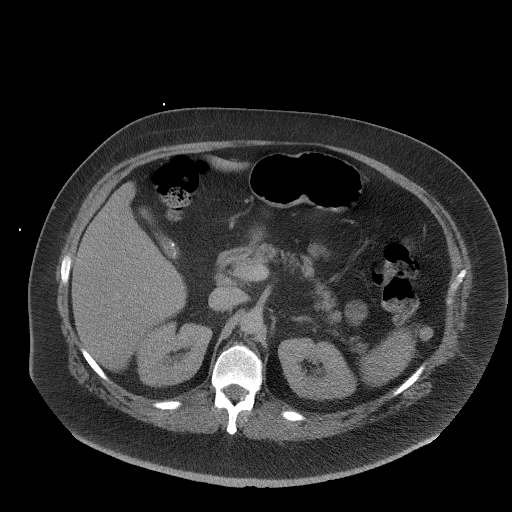
[im 100/120  soft-tissue]
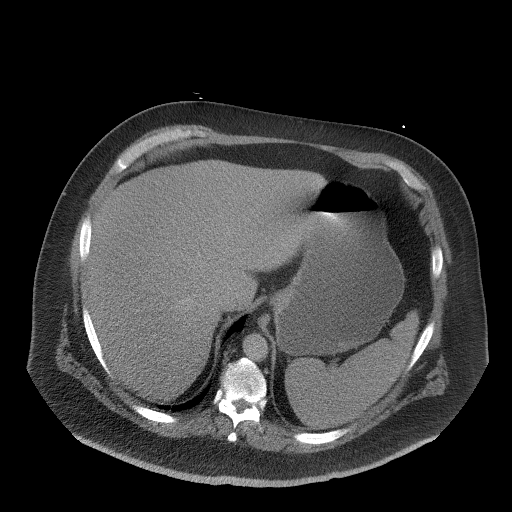
[im 100/120  bone]
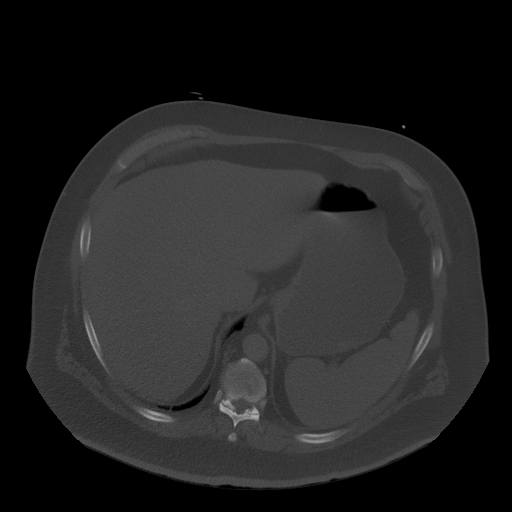
[im 113/120  soft-tissue]
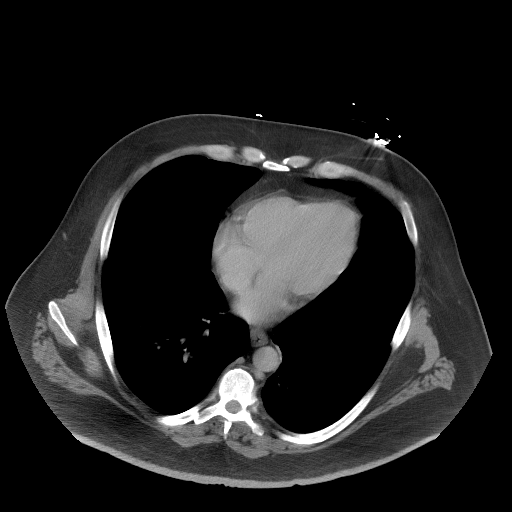

[Series 6: a/p w/ cor · coronal · 1.34mm/px · 3 of 201 slices shown]
[im 67/201  soft-tissue]
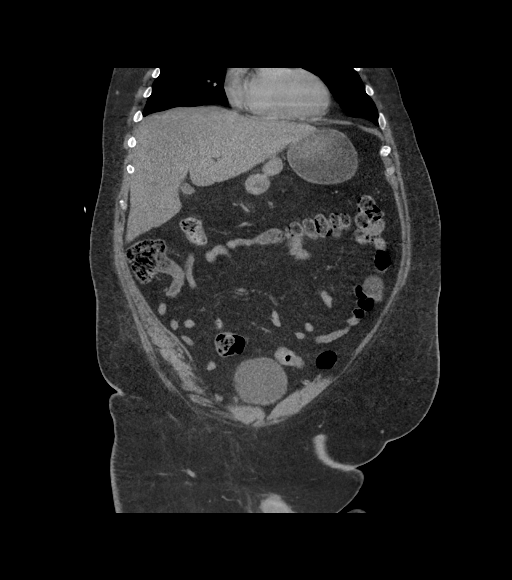
[im 89/201  soft-tissue]
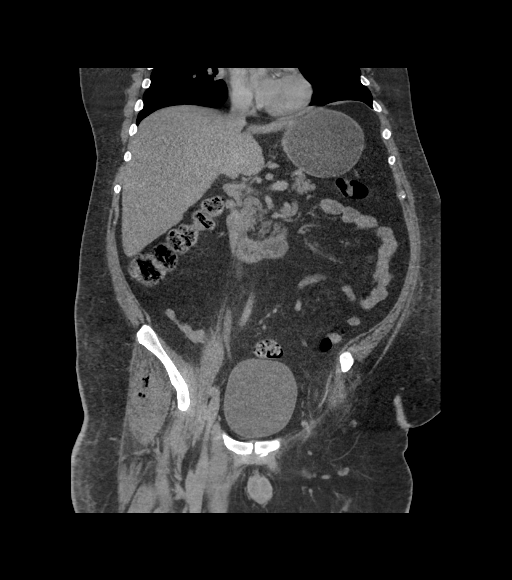
[im 112/201  soft-tissue]
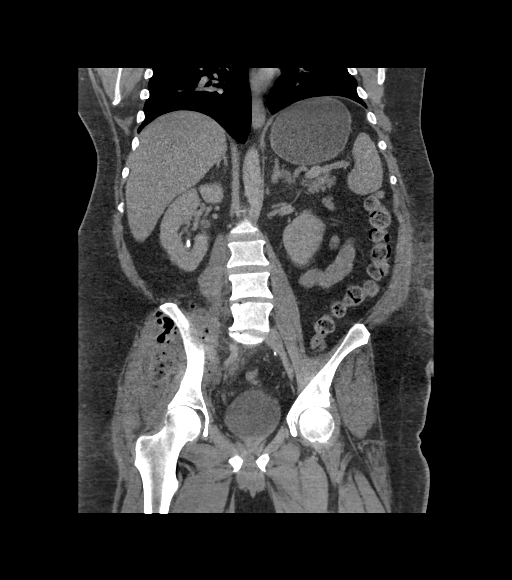

[13 of 46 positions shown; findings below may reference images not displayed]

FINDINGS: Lower chest: Lung bases are well aerated bilaterally. There is a
vague nodular density identified on the first image within the right
middle lobe. This area was not covered on the prior exam.

Hepatobiliary: Liver is diffusely decreased in attenuation
consistent with fatty infiltration. The gallbladder is decompressed.
Scattered calcifications are noted within consistent with stones. No
obstructive changes are noted.

Pancreas: Unremarkable. No pancreatic ductal dilatation or
surrounding inflammatory changes.

Spleen: Normal in size without focal abnormality.

Adrenals/Urinary Tract: Adrenal glands are within normal limits. The
kidneys are well visualized with renal calculi stable in appearance
from the prior exam. The largest of these lies in the lower pole
measuring approximately 9 mm. The left kidney demonstrates no renal
calculi. Very mild prominence of the collecting system and ureter on
the right are seen similar to that noted on the prior exam without
obstructing lesion. The bladder is well distended.

Stomach/Bowel: No obstructive or inflammatory changes of the colon
are seen. The appendix is not well visualized. No obstructive or
inflammatory changes of the small bowel are seen. The stomach is
unremarkable.

Vascular/Lymphatic: No significant vascular findings are present. No
enlarged abdominal or pelvic lymph nodes.

Reproductive: Prostate is unremarkable.

Other: No abdominal wall hernia or abnormality. No abdominopelvic
ascites.

Musculoskeletal: There are changes within the right buttock, right
iliac fossa and perispinal musculature on the right with fluid
collections and air foci identified consistent with localized
infection. The fluid collection within the right paraspinal
musculature measures 5.6 x 5.5 cm consistent with a focal abscess.
Similar extension into the iliac fossa on the right as well as the
musculature of the right buttocks is seen. Soft tissue density in
the presacral region and along the right pelvic wall are noted
consistent with focal localized infection as well.
IMPRESSION: Changes consistent with localized abscess involving the right iliac
fossa, musculature of the right buttocks and right paraspinal
musculature with extension into the lateral aspect of the pelvis on
the right. Percutaneous drainage of the paraspinal collection may be
helpful. These changes are all new from the prior exam.

Stable renal calculi on the right.

Stable cholelithiasis and fatty infiltration of the liver.

ADDENDUM:
Original report by Dr. Vincci. Addendum by Dr. Devito for contrast
extravasation documentation:

Type of contrast:  Isovue 300

Site of extravasation: Right AC

Estimated volume of extravasation: 40 ml

Area of extravasation scanned with CT? no

PATIENT'S SIGNS AND SYMPTOMS

Skin blistering/ulceration: no

Decrease capillary refill: no

Change in skin color: no

Decreased motor function or severe tightness: no

Decreased pulses distal to site of extravasation: no

Altered sensation: no

Increasing pain or signs of increased swelling during observation:
no

TREATMENT

An ice pack was applied with instructions given to the patient for
limb elevation and warning signs to look for. The patient was
returned to the emergency department.

*** End of Addendum ***
FINDINGS: Lower chest: Lung bases are well aerated bilaterally. There is a
vague nodular density identified on the first image within the right
middle lobe. This area was not covered on the prior exam.

Hepatobiliary: Liver is diffusely decreased in attenuation
consistent with fatty infiltration. The gallbladder is decompressed.
Scattered calcifications are noted within consistent with stones. No
obstructive changes are noted.

Pancreas: Unremarkable. No pancreatic ductal dilatation or
surrounding inflammatory changes.

Spleen: Normal in size without focal abnormality.

Adrenals/Urinary Tract: Adrenal glands are within normal limits. The
kidneys are well visualized with renal calculi stable in appearance
from the prior exam. The largest of these lies in the lower pole
measuring approximately 9 mm. The left kidney demonstrates no renal
calculi. Very mild prominence of the collecting system and ureter on
the right are seen similar to that noted on the prior exam without
obstructing lesion. The bladder is well distended.

Stomach/Bowel: No obstructive or inflammatory changes of the colon
are seen. The appendix is not well visualized. No obstructive or
inflammatory changes of the small bowel are seen. The stomach is
unremarkable.

Vascular/Lymphatic: No significant vascular findings are present. No
enlarged abdominal or pelvic lymph nodes.

Reproductive: Prostate is unremarkable.

Other: No abdominal wall hernia or abnormality. No abdominopelvic
ascites.

Musculoskeletal: There are changes within the right buttock, right
iliac fossa and perispinal musculature on the right with fluid
collections and air foci identified consistent with localized
infection. The fluid collection within the right paraspinal
musculature measures 5.6 x 5.5 cm consistent with a focal abscess.
Similar extension into the iliac fossa on the right as well as the
musculature of the right buttocks is seen. Soft tissue density in
the presacral region and along the right pelvic wall are noted
consistent with focal localized infection as well.
IMPRESSION: Changes consistent with localized abscess involving the right iliac
fossa, musculature of the right buttocks and right paraspinal
musculature with extension into the lateral aspect of the pelvis on
the right. Percutaneous drainage of the paraspinal collection may be
helpful. These changes are all new from the prior exam.

Stable renal calculi on the right.

Stable cholelithiasis and fatty infiltration of the liver.

## 2020-04-09 IMAGING — CT CT L SPINE W/O CM
2 of 3 series · 11 of 33 positions shown, 13 images · IV contrast (Omni 300)
Comparison: None.

CLINICAL DATA: Right-sided back pain and right lower extremity pain
for 3 weeks.

EXAM:
CT LUMBAR SPINE WITHOUT CONTRAST
TECHNIQUE: Multidetector CT imaging of the lumbar spine was performed without
intravenous contrast administration. Multiplanar CT image
reconstructions were also generated.

[Series 503: axial · axial · 0.69mm/px · z∈[+972,+1478]mm · 8 of 300 slices shown, 10 images]
[im 24/300  soft-tissue]
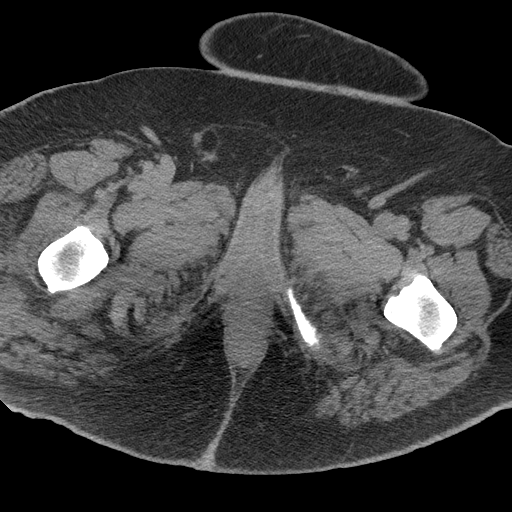
[im 24/300  bone]
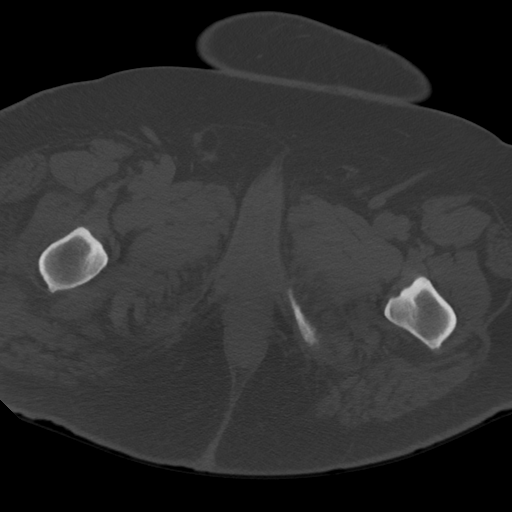
[im 70/300  bone]
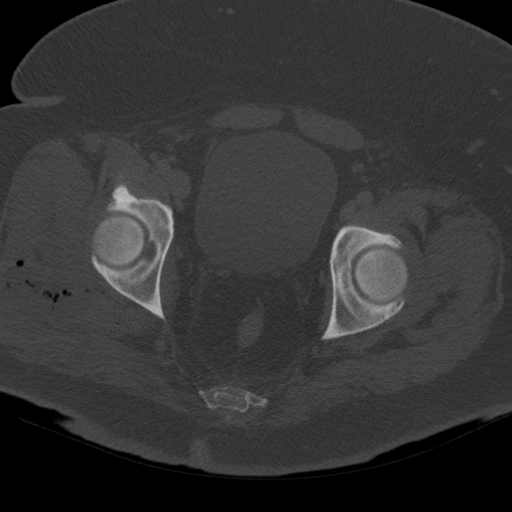
[im 93/300  bone]
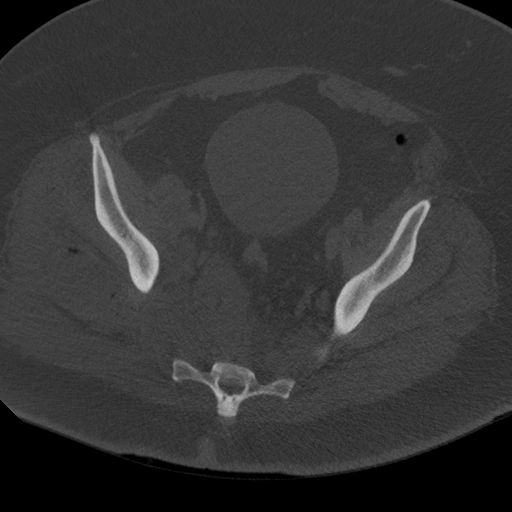
[im 139/300  bone]
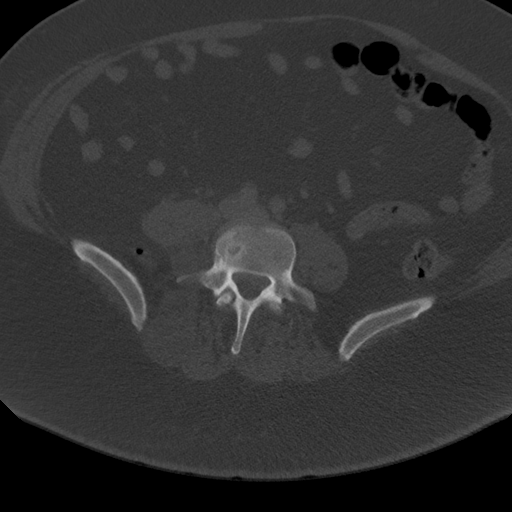
[im 162/300  soft-tissue]
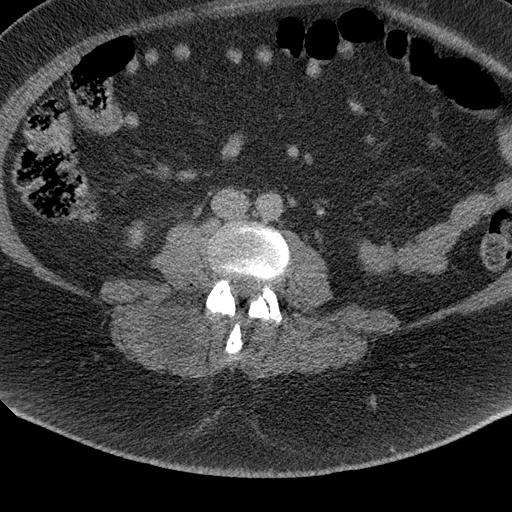
[im 162/300  bone]
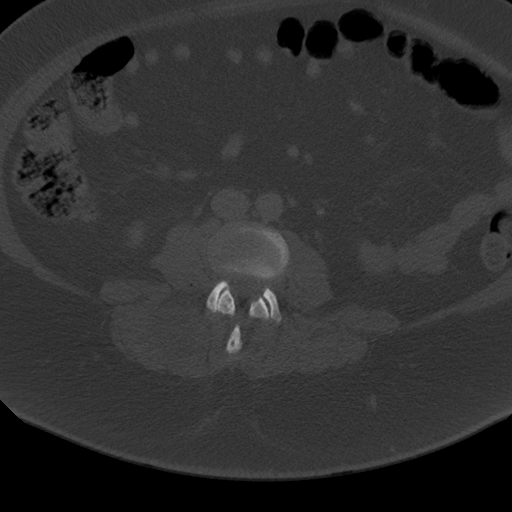
[im 208/300  bone]
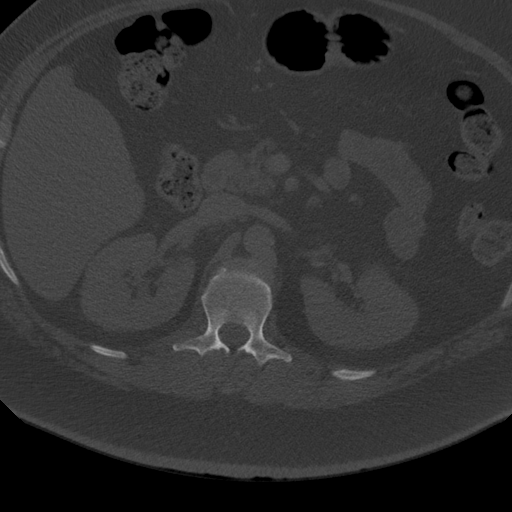
[im 231/300  bone]
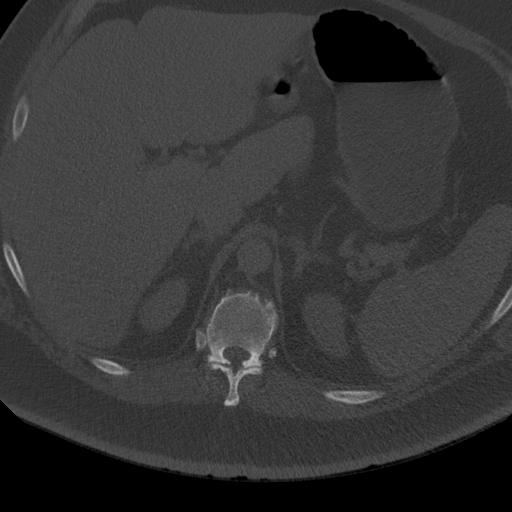
[im 277/300  bone]
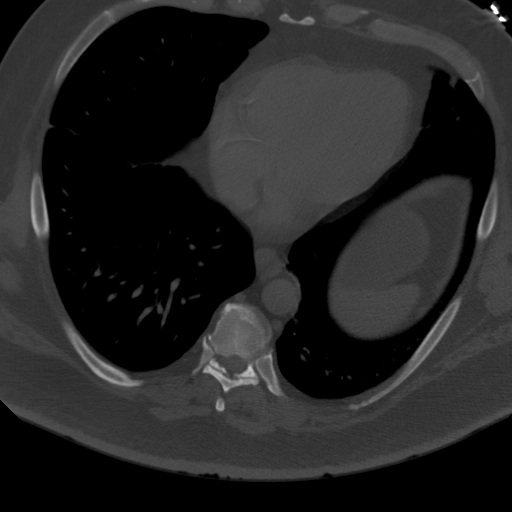

[Series 504: cor · coronal · 0.39mm/px · 3 of 191 slices shown]
[im 39/191  bone]
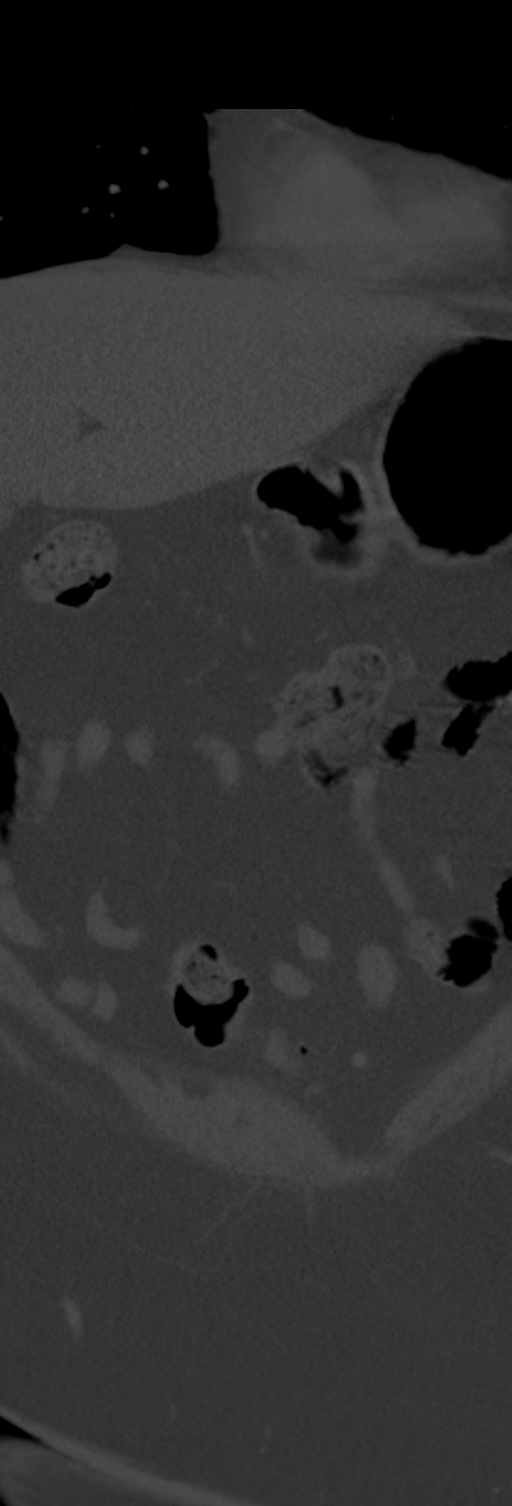
[im 77/191  bone]
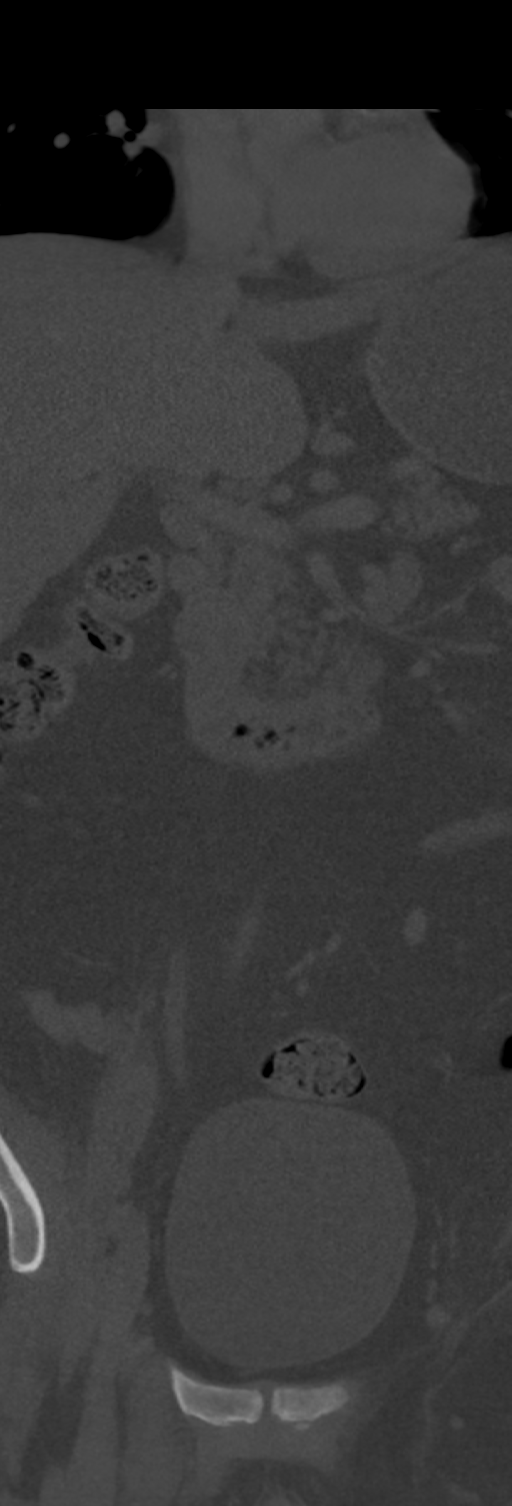
[im 115/191  bone]
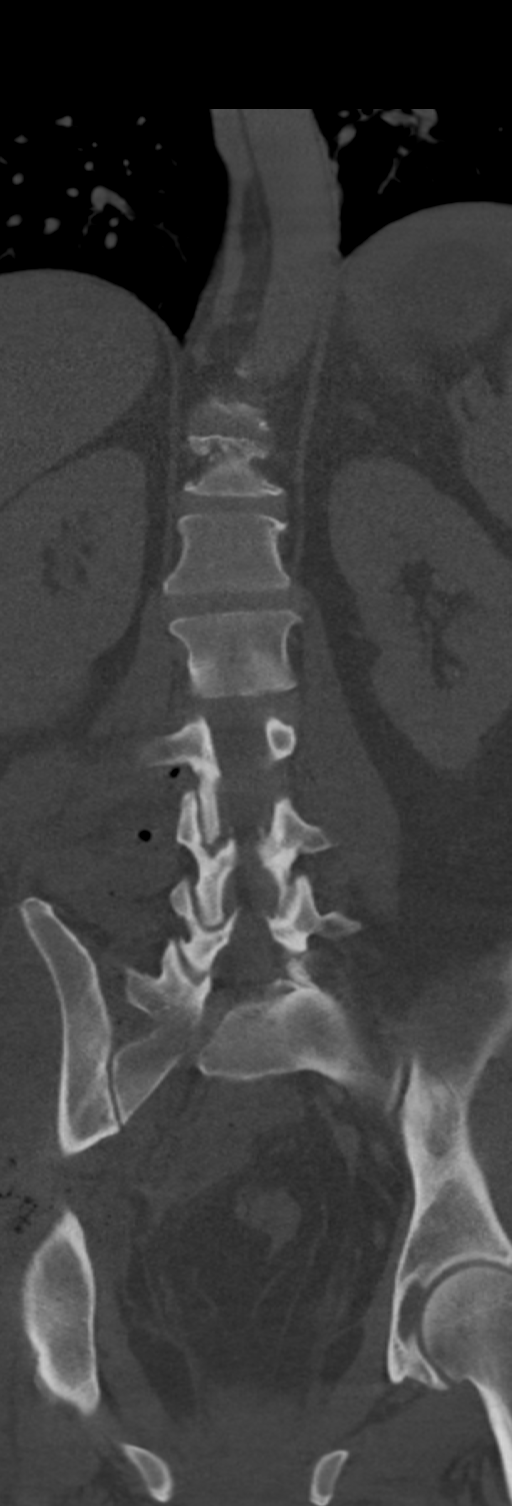

[11 of 33 positions shown; findings below may reference images not displayed]

FINDINGS: Segmentation: 5 lumbar type vertebrae.

Alignment: Normal.

Vertebrae: No acute fracture or focal pathologic process.

Paraspinal and other soft tissues: There is gas in the paraspinal
soft tissues with a 5.6 cm fluid collection in the right multifidus
muscle at the L4-5 level. There is gas in the right iliacus and
psoas muscles and right gluteus minimus and medius muscles with
expansion of those muscles with low-density consistent with abscess.
There is also fluid and abnormal soft tissue in the presacral space
extending through the right sciatic notch.

There is a tiny bubble of air in the right hip joint.

Cholelithiasis. 5 mm stone in the mid right kidney. Small right
inguinal hernia containing only fat.

Disc levels: T7-8 through L3-4: No appreciable disc protrusions. The
discs at T9-10 and T11-12 are degenerated with vacuum phenomena.

L4-5: Small broad-based disc bulge. Slight hypertrophy of the left
ligamentum flavum creates narrowing of the spinal canal. The CT
thecal sac appears compressed in the transverse dimension.

L5-S1: Disc space narrowing asymmetric disc osteophyte complex to
the left of midline. There appears to be abnormal soft tissue in the
spinal canal at S1. Given the adjacent soft tissue infection, the
possibility of infection in the spinal canal should be considered.
MRI of the lower abdomen and pelvis and lumbar spine recommended for
further evaluation of the findings to determine whether there is
epidural extension of infection.
IMPRESSION: 1. Extensive soft tissue infection in bulging the right paraspinal
muscles, right gluteal muscles, presacral space, and the soft
tissues of the right sciatic notch. There is a definable abscess in
the right multifidus muscle.
2. Subtle increased soft tissue in the spinal canal at S1.
Asymmetric compression of the thecal sac at L4-5. The possibility of
infection within the spinal canal should be considered.
3. MRI of the lumbar spine with and without contrast and MRI of the
lower abdomen and pelvis with and without contrast recommended for
further evaluation.

Critical Value/emergent results were called by telephone at the time
of interpretation on 07/06/2019 at [DATE] to the attending ER
physician , who verbally acknowledged these results.

## 2020-04-14 IMAGING — CR DG ABDOMEN 1V
2 series · 2 of 2 positions shown · non-contrast
Comparison: 07/06/2019

CLINICAL DATA: Nausea and vomiting with abdominal pain

EXAM:
ABDOMEN - 1 VIEW

[abdomen kub (1 of 2)]
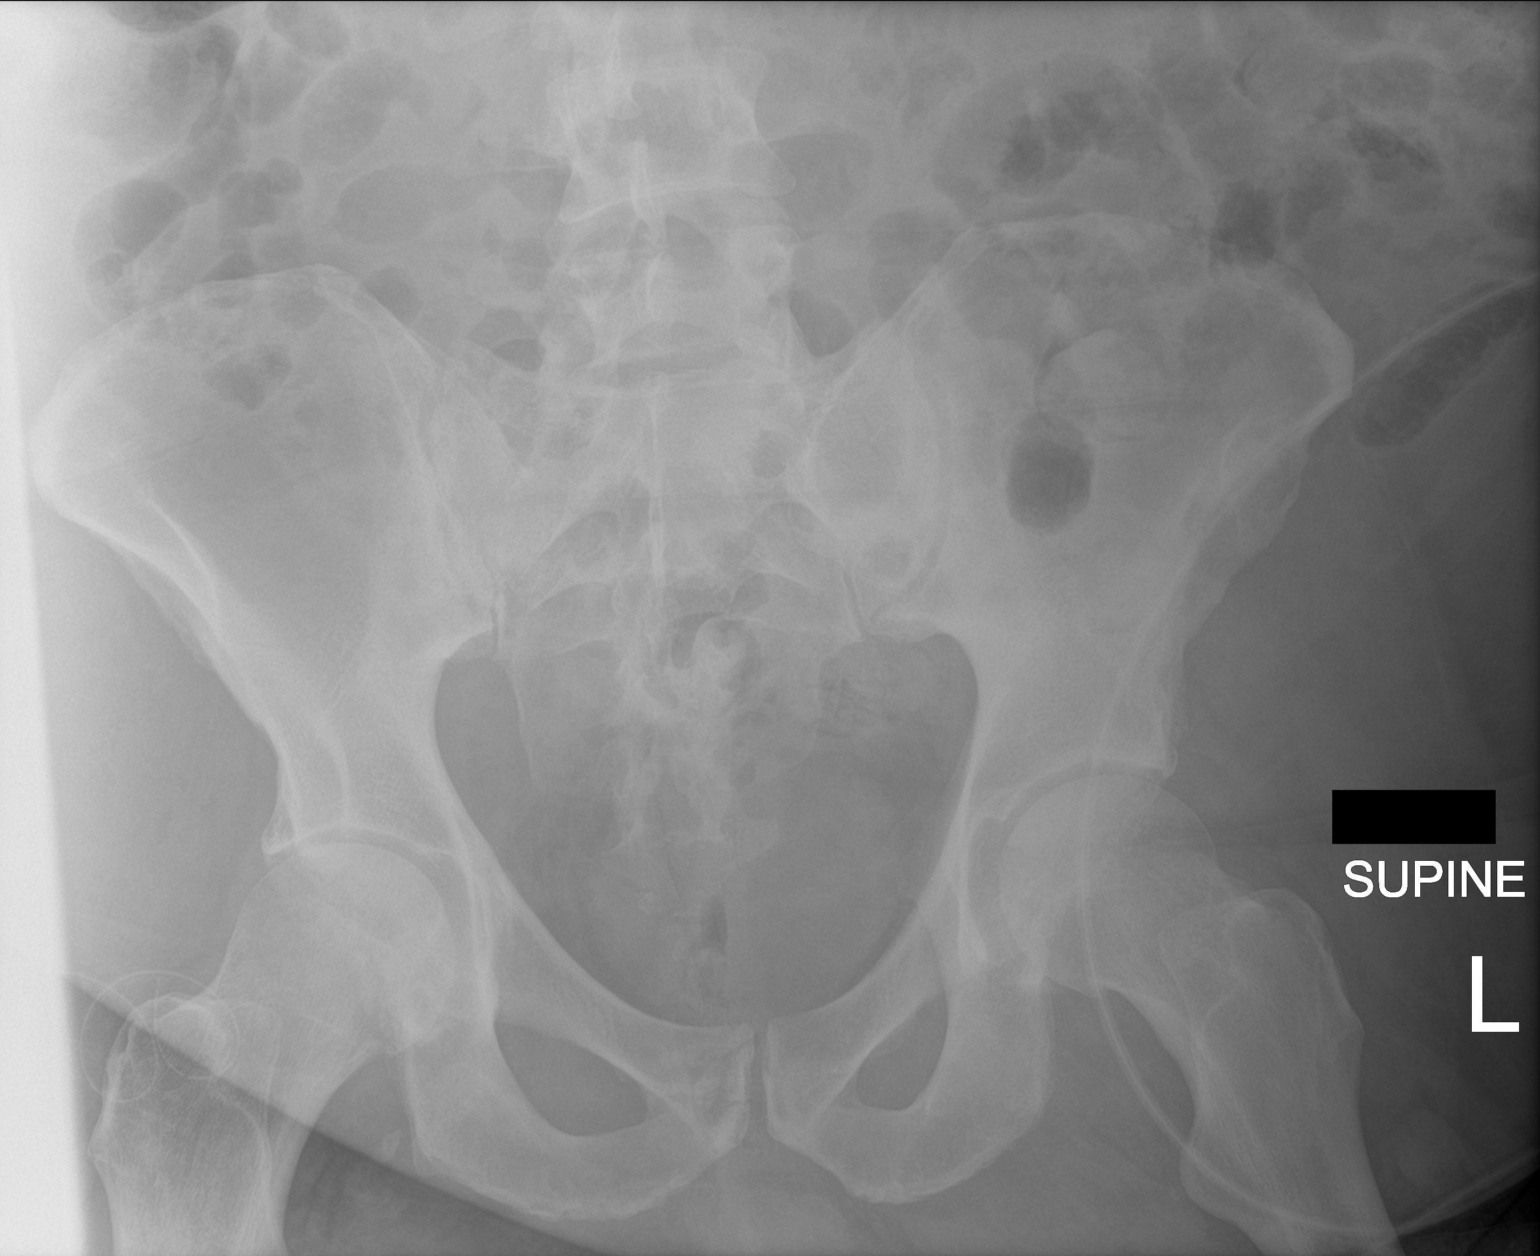

[abdomen kub (2 of 2)]
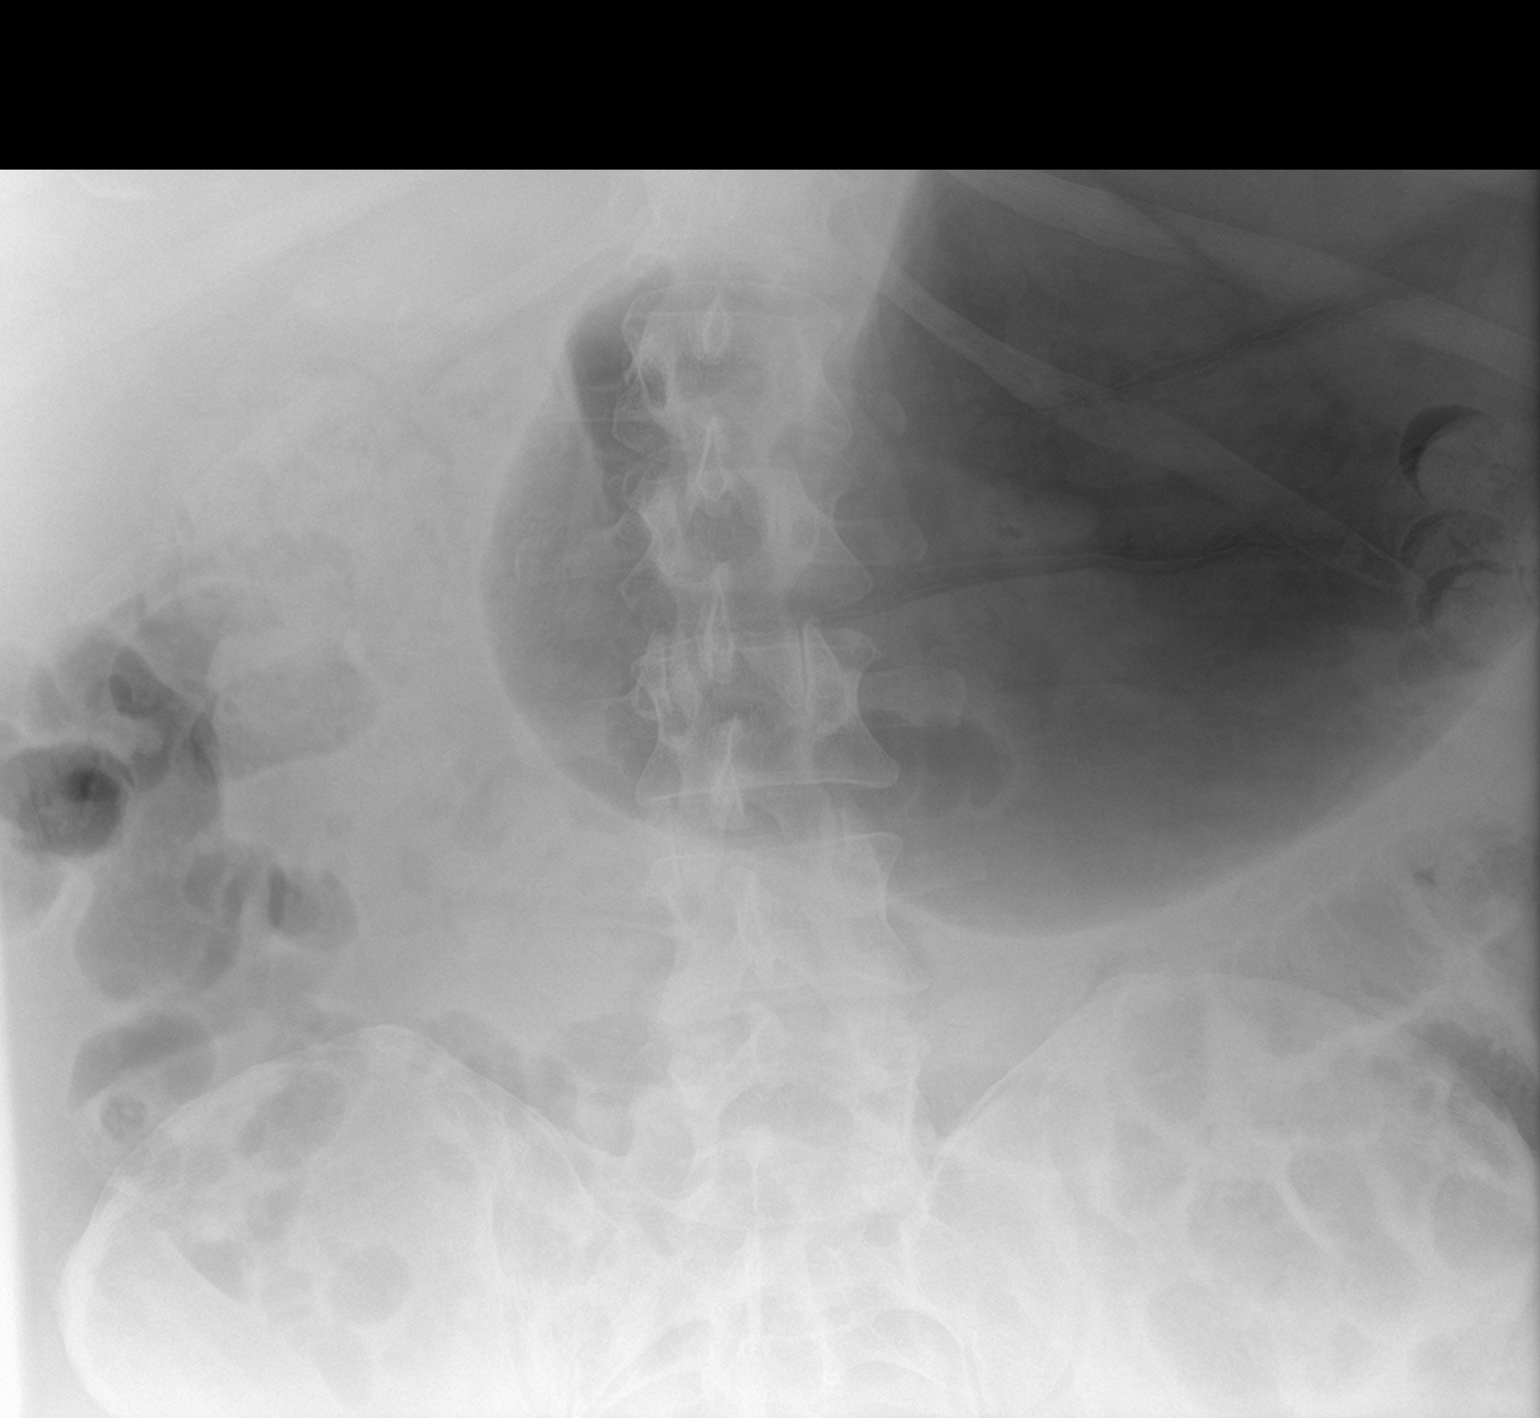

[2 of 2 positions shown; findings below may reference images not displayed]

FINDINGS: Marked gaseous dilatation of the stomach. Otherwise nonobstructed
gas pattern with gas in the small and large bowel. Findings could be
secondary to enteritis or mild ileus.
IMPRESSION: 1. Incompletely visualized marked gaseous dilatation of stomach,
consider NG tube decompression. Outlet obstruction could be
considered.
2. Remainder of the gas pattern shows air-filled small and large
bowel in a nonobstructed pattern, question enteritis or mild ileus.

## 2020-04-18 MED FILL — METHOCARBAMOL 500 MG TABS: 500 | 15 days supply | Qty: 120 | Fill #1

## 2020-04-18 MED FILL — DULoxetine HCL 20 MG CPEP: 20 | 30 days supply | Qty: 60 | Fill #1

## 2020-05-23 MED FILL — DULoxetine HCL 20 MG CPEP: 20 | 30 days supply | Qty: 60 | Fill #2

## 2020-05-30 ENCOUNTER — Other Ambulatory Visit: Payer: Self-pay | Admitting: Internal Medicine

## 2020-05-30 DIAGNOSIS — L02415 Cutaneous abscess of right lower limb: Secondary | ICD-10-CM

## 2020-05-30 MED FILL — METHOCARBAMOL 500 MG TABS: 500 | 15 days supply | Qty: 120 | Fill #0

## 2020-05-30 NOTE — Telephone Encounter (Signed)
Requested medication (s) are due for refill today: no  Requested medication (s) are on the active medication list: yes  Last refill:  04/18/2020  Future visit scheduled: yes  Notes to clinic:  this refill cannot be delegated    Requested Prescriptions  Pending Prescriptions Disp Refills   methocarbamol (ROBAXIN) 500 MG tablet [Pharmacy Med Name: METHOCARBAMOL 500 MG TABS 500 Tablet] 120 tablet 1    Sig: TAKE 2 TABLETS (1,000 MG TOTAL) BY MOUTH 4 (FOUR) TIMES DAILY.      Not Delegated - Analgesics:  Muscle Relaxants Failed - 05/30/2020  1:34 PM      Failed - This refill cannot be delegated      Passed - Valid encounter within last 6 months    Recent Outpatient Visits           3 months ago Type 2 diabetes mellitus without complication, with long-term current use of insulin ([place])   [place], Neoma Laming B, MD   6 months ago Current moderate episode of major depressive disorder without prior episode [facility])   [place] Karle Plumber B, MD   7 months ago Type 2 diabetes mellitus without complication, with long-term current use of insulin [facility])   [place] Lad[place] Pier, MD   9 months ago Hospital discharge follow-up   [place], MD       Future Appointments             In 1 month Wynetta Emery Dalbert Batman, MD [place]

## 2020-06-04 MED FILL — DULoxetine HCL 20 MG CPEP: 20 | 30 days supply | Qty: 60 | Fill #2

## 2020-07-01 ENCOUNTER — Other Ambulatory Visit: Payer: Self-pay

## 2020-07-01 ENCOUNTER — Ambulatory Visit (HOSPITAL_BASED_OUTPATIENT_CLINIC_OR_DEPARTMENT_OTHER): Payer: Self-pay | Admitting: Pharmacist

## 2020-07-01 ENCOUNTER — Other Ambulatory Visit: Payer: Self-pay | Admitting: Internal Medicine

## 2020-07-01 ENCOUNTER — Ambulatory Visit: Payer: Self-pay | Attending: Internal Medicine | Admitting: Internal Medicine

## 2020-07-01 VITALS — BP 152/108 | HR 99 | Temp 97.7°F | Wt 381.0 lb

## 2020-07-01 DIAGNOSIS — F321 Major depressive disorder, single episode, moderate: Secondary | ICD-10-CM

## 2020-07-01 DIAGNOSIS — E119 Type 2 diabetes mellitus without complications: Secondary | ICD-10-CM

## 2020-07-01 DIAGNOSIS — I10 Essential (primary) hypertension: Secondary | ICD-10-CM

## 2020-07-01 DIAGNOSIS — Z23 Encounter for immunization: Secondary | ICD-10-CM

## 2020-07-01 DIAGNOSIS — E1142 Type 2 diabetes mellitus with diabetic polyneuropathy: Secondary | ICD-10-CM

## 2020-07-01 DIAGNOSIS — Z1211 Encounter for screening for malignant neoplasm of colon: Secondary | ICD-10-CM

## 2020-07-01 DIAGNOSIS — Z794 Long term (current) use of insulin: Secondary | ICD-10-CM

## 2020-07-01 DIAGNOSIS — L989 Disorder of the skin and subcutaneous tissue, unspecified: Secondary | ICD-10-CM | POA: Insufficient documentation

## 2020-07-01 DIAGNOSIS — R5383 Other fatigue: Secondary | ICD-10-CM

## 2020-07-01 LAB — POCT GLYCOSYLATED HEMOGLOBIN (HGB A1C): Hemoglobin A1C: 9.6 % — AB (ref 4.0–5.6)

## 2020-07-01 LAB — GLUCOSE, POCT (MANUAL RESULT ENTRY): POC Glucose: 256 mg/dl — AB (ref 70–99)

## 2020-07-01 MED ORDER — GABAPENTIN 300 MG PO CAPS: 300.0000 mg | ORAL_CAPSULE | Freq: Every day | ORAL | 6 refills | Status: DC

## 2020-07-01 MED ORDER — INSULIN ASPART 100 UNIT/ML FLEXPEN
8.0000 [IU] | PEN_INJECTOR | Freq: Three times a day (TID) | SUBCUTANEOUS | 11 refills | Status: DC
  Filled 2021-03-11: qty 6, 25d supply, fill #0

## 2020-07-01 MED ORDER — DULOXETINE HCL 20 MG PO CPEP: 40.0000 mg | ORAL_CAPSULE | Freq: Every day | ORAL | 2 refills | Status: DC

## 2020-07-01 MED ORDER — HYDRALAZINE HCL 50 MG PO TABS: 50.0000 mg | ORAL_TABLET | Freq: Two times a day (BID) | ORAL | 6 refills | Status: DC

## 2020-07-01 MED ORDER — METFORMIN HCL 500 MG PO TABS: 500.0000 mg | ORAL_TABLET | Freq: Two times a day (BID) | ORAL | 5 refills | Status: DC

## 2020-07-01 MED ORDER — INSULIN NPH (HUMAN) (ISOPHANE) 100 UNIT/ML ~~LOC~~ SUSP
28.0000 [IU] | Freq: Two times a day (BID) | SUBCUTANEOUS | 11 refills | Status: DC
  Filled 2021-03-11: qty 10, 18d supply, fill #0
  Filled 2021-05-21: qty 10, 18d supply, fill #1

## 2020-07-01 MED FILL — !NOVOLOG FLEXPEN SYRINGE 1: 100/ML | 25 days supply | Qty: 6 | Fill #0

## 2020-07-01 MED FILL — GABAPENTIN 300 MG CAPSULE: 300 | 30 days supply | Qty: 30 | Fill #0

## 2020-07-01 MED FILL — NovoLIN N 100 UNIT/ML SUSP: 100 | 17 days supply | Qty: 10 | Fill #0

## 2020-07-01 MED FILL — METFORMIN HCL 500 MG TABS: 500 | 30 days supply | Qty: 60 | Fill #0

## 2020-07-01 MED FILL — DULoxetine HCL 20 MG CPEP: 20 | 30 days supply | Qty: 60 | Fill #0

## 2020-07-01 MED FILL — hydrALAZINE HCL 50 MG TABS: 50 | 30 days supply | Qty: 60 | Fill #0

## 2020-07-01 NOTE — Patient Instructions (Addendum)
Your diabetes is not well controlled.  Continue the NPH insulin and NovoLog.  We have added a medication called Metformin which she will take 500 mg twice a day. Please apply for the orange card/cone discount card so we can refer you to the nutritionist as we had discussed today.  You have gained 53 pounds since December.  Consider joining the [facility].  Once you have been approved for the orange card we can also refer you to physical therapy to work with you on strengthening and endurance.  You have neuropathy in your feet most likely due to diabetes.  We have restarted gabapentin 300 mg at bedtime.  Your blood pressure is not controlled.  We have increased the hydralazine to 50 mg twice a day.  We will have you follow-up in 1 week with our clinical pharmacist for recheck of your blood pressure.  Influenza Virus Vaccine injection (Fluarix) What is this medicine? INFLUENZA VIRUS VACCINE (in floo EN zuh VAHY ruhs vak SEEN) helps to reduce the risk of getting influenza also known as the flu. This medicine may be used for other purposes; ask your health care provider or pharmacist if you have questions. COMMON BRAND NAME(S): Fluarix, Fluzone What should I tell my health care provider before I take this medicine? They need to know if you have any of these conditions:  bleeding disorder like hemophilia  fever or infection  Guillain-Barre syndrome or other neurological problems  immune system problems  infection with the human immunodeficiency virus (HIV) or AIDS  low blood platelet counts  multiple sclerosis  an unusual or allergic reaction to influenza virus vaccine, eggs, chicken proteins, latex, gentamicin, other medicines, foods, dyes or preservatives  pregnant or trying to get pregnant  breast-feeding How should I use this medicine? This vaccine is for injection into a muscle. It is given by a health care professional. A copy of Vaccine Information Statements will be given before  each vaccination. Read this sheet carefully each time. The sheet may change frequently. Talk to your pediatrician regarding the use of this medicine in children. Special care may be needed. Overdosage: If you think you have taken too much of this medicine contact a poison control center or emergency room at once. NOTE: This medicine is only for you. Do not share this medicine with others. What if I miss a dose? This does not apply. What may interact with this medicine?  chemotherapy or radiation therapy  medicines that lower your immune system like etanercept, anakinra, infliximab, and adalimumab  medicines that treat or prevent blood clots like warfarin  phenytoin  steroid medicines like prednisone or cortisone  theophylline  vaccines This list may not describe all possible interactions. Give your health care provider a list of all the medicines, herbs, non-prescription drugs, or dietary supplements you use. Also tell them if you smoke, drink alcohol, or use illegal drugs. Some items may interact with your medicine. What should I watch for while using this medicine? Report any side effects that do not go away within 3 days to your doctor or health care professional. Call your health care provider if any unusual symptoms occur within 6 weeks of receiving this vaccine. You may still catch the flu, but the illness is not usually as bad. You cannot get the flu from the vaccine. The vaccine will not protect against colds or other illnesses that may cause fever. The vaccine is needed every year. What side effects may I notice from receiving this medicine? Side effects  that you should report to your doctor or health care professional as soon as possible:  allergic reactions like skin rash, itching or hives, swelling of the face, lips, or tongue Side effects that usually do not require medical attention (report to your doctor or health care professional if they continue or are  bothersome):  fever  headache  muscle aches and pains  pain, tenderness, redness, or swelling at site where injected  weak or tired This list may not describe all possible side effects. Call your doctor for medical advice about side effects. You may report side effects to FDA at 1-800-FDA-1088. Where should I keep my medicine? This vaccine is only given in a clinic, pharmacy, doctor's office, or other health care setting and will not be stored at home. NOTE: This sheet is a summary. It may not cover all possible information. If you have questions about this medicine, talk to your doctor, pharmacist, or health care provider.  2020 Elsevier/Gold Standard (2008-05-15 09:30:40)

## 2020-07-01 NOTE — Progress Notes (Signed)
Patient presents for vaccination against influenza per orders of Dr. Johnson. Consent given. Counseling provided. No contraindications exists. Vaccine administered without incident.  ° °Luke Van Ausdall, PharmD, CPP °Clinical Pharmacist °[facility] °[phone] ° °

## 2020-07-01 NOTE — Progress Notes (Signed)
Patient ID: Bradley Cox, male    DOB: 13-Apr-1971  MRN: [id number]  CC: Follow-up (Pt. is here for 4 months F.U. )   Subjective: Bradley Cox is a 49 y.o. male who presents for chronic ds management His concerns today include:  Patient with history of DM type II, obesity, HTN, anemia due to acute blood loss, DVT right leg,asthma. MSSAbacteremia, depression.  DIABETES TYPE 2 Last A1C:   Results for orders placed or performed in visit on 07/01/20  Glucose (CBG)  Result Value Ref Range   POC Glucose 256 (A) 70 - 99 mg/dl  HgB A1c  Result Value Ref Range   Hemoglobin A1C 9.6 (A) 4.0 - 5.6 %   HbA1c POC (<> result, manual entry)     HbA1c, POC (prediabetic range)     HbA1c, POC (controlled diabetic range)     Med Adherence:  '[x]'  Yes he is on NPH insulin 28 units twice a day and NovoLog 8 units with meals. Medication side effects:  '[]'  Yes    '[x]'  No Home Monitoring?  '[x]'  Yes  Every morning Home glucose results range: 123-150 Diet Adherence: '[]'  Yes    '[x]'  No.  Not eating junk foods, sweets.  Portion sizes decreased.    Exercise: '[]'  Yes    '[x]'  No.  Gained 53 lbs since 10/2019.  He attributes this to inactivity.  Tries to get out and walk but heat makes him dizzy and feels lack of energy.  Energy decreases by late afternoon.  No exercise equipment at home.  There is a gym close to his house.  He has been contemplating joining.  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  States that his energy level is just not there as yet. Hypoglycemic episodes?: '[]'  Yes    '[x]'  No Numbness of the feet? '[x]'  Yes.  Reports numbness in both feet with the right being more so than the left.  The numbness is intermittent but almost daily.  He was on gabapentin in the past for chronic pain associated with the surgery that he had on the right leg.  However he states he has stopped taking gabapentin several months ago.  He finds that washing his feet in [place] his feet. He has a  hard time going up and down stairs and walking up inclines have a sense having surgery on the right leg.  He feels the right leg is still a little weak.  He gets some swelling in the right leg.  He has compression stockings but has not been using them consistently. Retinopathy hx? '[]'  Yes    '[]'  No Last eye exam: Overdue for an eye exam.  He is uninsured. Comments: Reports that he had an episode yesterday of vomiting.  He was just sitting talking when all of a sudden he got a bad feeling in his throat and vomited.  HTN: Blood pressure noted to be elevated today.  He reports compliance with taking hydralazine.  He has no device at home to check blood pressure.  He limits salt in the foods.   He notes that all of the incisions on his right leg have healed up since April of this year.  Every now and again though he noticed that a certain spot would "bubble up" then gets to itching.  He does not note any drainage when it does this.      Complains of having a lesion on the right nostril that started  last year when he was in the hospital.  Every now and again he would try to pop it but then it will return.  It has stayed about the same size.  Depression: Requests refill on Cymbalta. Patient Active Problem List   Diagnosis Date Noted  . Primary insomnia 02/29/2020  . Obesity (BMI 30-39.9) 10/20/2019  . Current moderate episode of major depressive disorder without prior episode ([place]) 10/20/2019  . Drug induced constipation   . Uncontrolled type 2 diabetes mellitus with hyperglycemia ([place])   . Acute blood loss anemia   . Postoperative pain   . MSSA bacteremia 07/10/2019  . Abscess of right hip 07/09/2019  . 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 GERD 01/21/2009  . DYSPHAGIA 01/21/2009  . Essential hypertension 01/07/2009  . ASTHMA 01/07/2009     Current Outpatient Medications on File Prior to Visit  Medication Sig Dispense Refill  . acetaminophen (TYLENOL) 325 MG tablet Take 2 tablets  (650 mg total) by mouth every 6 (six) hours as needed for mild pain.    [name] Kitchen albuterol (VENTOLIN HFA) 108 (90 Base) MCG/ACT inhaler Inhale 1 puff into the lungs every 6 (six) hours as needed for wheezing or shortness of breath.     . Blood Glucose Monitoring Suppl (TRUE METRIX METER) w/Device KIT Use as directed 1 kit 0  . glucose blood (TRUE METRIX BLOOD GLUCOSE TEST) test strip Use as instructed 100 each 12  . Insulin Syringe-Needle U-100 (BD INSULIN SYRINGE ULTRAFINE) 31G X 15/64" 1 ML MISC Use as directed 100 each 4  . methocarbamol (ROBAXIN) 500 MG tablet TAKE 2 TABLETS (1,000 MG TOTAL) BY MOUTH 4 (FOUR) TIMES DAILY. 120 tablet 0  . Multiple Vitamin (THERA) TABS Take 2 tablets by mouth daily.    . TRUEplus Lancets 28G MISC Use as directed 100 each 4   No current facility-administered medications on file prior to visit.    Allergies  Allergen Reactions  . Toradol [Ketorolac Tromethamine]     SOB, sweating. Tolerates Ibuprofen. Wife reports reaction may have been due to injection.    Social History   Socioeconomic History  . Marital status: Single    Spouse name: Not on file  . Number of children: Not on file  . Years of education: Not on file  . Highest education level: Not on file  Occupational History  . Not on file  Tobacco Use  . Smoking status: Never Smoker  . Smokeless tobacco: Current User    Types: Snuff  Substance and Sexual Activity  . Alcohol use: Yes    Comment: rarely  . Drug use: Never  . Sexual activity: Not on file  Other Topics Concern  . Not on file  Social History Narrative  . Not on file   Social Determinants of Health   Financial Resource Strain:   . Difficulty of Paying Living Expenses: Not on file  Food Insecurity:   . Worried About Charity fundraiser in the Last Year: Not on file  . Ran Out of Food in the Last Year: Not on file  Transportation Needs:   . Lack of Transportation (Medical): Not on file  . Lack of Transportation (Non-Medical):  Not on file  Physical Activity:   . Days of Exercise per Week: Not on file  . Minutes of Exercise per Session: Not on file  Stress:   . Feeling of Stress : Not on file  Social Connections:   . Frequency of Communication with Friends and  Family: Not on file  . Frequency of Social Gatherings with Friends and Family: Not on file  . Attends Religious Services: Not on file  . Active Member of Clubs or Organizations: Not on file  . Attends Archivist Meetings: Not on file  . Marital Status: Not on file  Intimate Partner Violence:   . Fear of Current or Ex-Partner: Not on file  . Emotionally Abused: Not on file  . Physically Abused: Not on file  . Sexually Abused: Not on file    Family History  Problem Relation Age of Onset  . Diabetes Paternal Grandmother     Past Surgical History:  Procedure Laterality Date  . APPLICATION OF WOUND VAC Right 07/17/2019   Procedure: Application Of Wound Vac;  Surgeon: Altamese [place], MD;  Location: [place];  Service: Orthopedics;  Laterality: Right;  . APPLICATION OF WOUND VAC Right 07/20/2019   Procedure: Application Of Wound Vac;  Surgeon: Altamese [place], MD;  Location: [place];  Service: Orthopedics;  Laterality: Right;  . APPLICATION OF WOUND VAC Right 07/27/2019   Procedure: Application Of Wound Vac;  Surgeon: Shona Needles, MD;  Location: [place];  Service: Orthopedics;  Laterality: Right;  . HERNIA REPAIR     As a baby  . I & D EXTREMITY Right 07/27/2019   Procedure: IRRIGATION AND DEBRIDEMENT EXTREMITY Right Leg;  Surgeon: Shona Needles, MD;  Location: [place];  Service: Orthopedics;  Laterality: Right;  . I & D EXTREMITY Right 07/30/2019   Procedure: IRRIGATION AND DEBRIDEMENT EXTREMITY WITH WOUND VAC CHANGE;  Surgeon: Shona Needles, MD;  Location: [place];  Service: Orthopedics;  Laterality: Right;  IRRIGATION AND DEBRIDEMENT EXTREMITY WITH WOUND VAC CHANGE  . I & D EXTREMITY Right 08/08/2019   Procedure: IRRIGATION AND DEBRIDEMENT RIGHT  EXTREMITY;  Surgeon: Shona Needles, MD;  Location: [place];  Service: Orthopedics;  Laterality: Right;  . INCISION AND DRAINAGE ABSCESS Right 07/09/2019   Procedure: INCISION AND DRAINAGE ABSCESS RIGHT HIP/PELVIS;  Surgeon: Shona Needles, MD;  Location: [place];  Service: Orthopedics;  Laterality: Right;  . INCISION AND DRAINAGE HIP Right 07/17/2019   Procedure: IRRIGATION AND DEBRIDEMENT HIP;  Surgeon: Altamese [place], MD;  Location: [place];  Service: Orthopedics;  Laterality: Right;  . INCISION AND DRAINAGE HIP Right 07/20/2019   Procedure: Irrigation And Debridement Hip;  Surgeon: Altamese [place], MD;  Location: [place];  Service: Orthopedics;  Laterality: Right;  . TEE WITHOUT CARDIOVERSION N/A 07/11/2019   Procedure: TRANSESOPHAGEAL ECHOCARDIOGRAM (TEE);  Surgeon: Sanda Klein, MD;  [facility]: [facility];  Service: Cardiovascular;  Laterality: N/A;    ROS: Review of Systems Negative except as stated above  PHYSICAL EXAM: BP (!) 152/108 (BP Location: Left Arm, Patient Position: Sitting, Cuff Size: Normal)   Pulse 99   Temp 97.7 F (36.5 C) (Temporal)   Wt (!) 381 lb (172.8 kg)   SpO2 97%   BMI 44.03 kg/m   Wt Readings from Last 3 Encounters:  07/01/20 (!) 381 lb (172.8 kg)  10/19/19 (!) 328 lb (148.8 kg)  08/31/19 (!) 340 lb 12.8 oz (154.6 kg)    Physical Exam  General appearance - alert, well appearing, and in no distress Mental status - normal mood, behavior, speech, dress, motor activity, and thought processes Nose -pearly raised lesion on the right nostril.  It is just below a centimeter in size Neck - supple, no significant adenopathy Chest - clear to auscultation, no wheezes, rales or rhonchi, symmetric  air entry Heart - normal rate, regular rhythm, normal S1, S2, no murmurs, rubs, clicks or gallops Extremities -he has mild nonpitting edema above his socks line on both feet right greater than left. Skin: Incisions on the right leg are old healed and scarred over. Diabetic  Foot Exam - Simple   Simple Foot Form Visual Inspection No deformities, no ulcerations, no other skin breakdown bilaterally: Yes Sensation Testing See comments: Yes Pulse Check See comments: Yes Comments Decreased sensation on leap exam on the plantar surface of both feet.  Dorsalis pedis and posterior tibialis pulses 2+ bilaterally.     Depression screen [facility] 2/9 07/01/2020 11/30/2019 10/19/2019  Decreased Interest 2 - 1  Down, Depressed, Hopeless '1 3 3  ' PHQ - 2 Score '3 3 4  ' Altered sleeping 3 0 3  Tired, decreased energy '3 3 3  ' Change in appetite '1 1 1  ' Feeling bad or failure about yourself  '1 3 3  ' Trouble concentrating '1 3 2  ' Moving slowly or fidgety/restless 0 3 1  Suicidal thoughts 0 0 1  PHQ-9 Score '12 16 18    ' CMP Latest Ref Rng & Units 08/27/2019 08/23/2019 08/20/2019  Glucose 70 - 99 mg/dL 146(H) 120(H) 118(H)  BUN 6 - 20 mg/dL <5(L) 5(L) 5(L)  Creatinine 0.61 - 1.24 mg/dL 0.57(L) 0.44(L) 0.54(L)  Sodium 135 - 145 mmol/L 137 135 136  Potassium 3.5 - 5.1 mmol/L 3.7 3.8 3.7  Chloride 98 - 111 mmol/L 100 98 99  CO2 22 - 32 mmol/L '28 27 30  ' Calcium 8.9 - 10.3 mg/dL 8.7(L) 8.6(L) 8.7(L)  Total Protein 6.5 - 8.1 g/dL 6.2(L) 6.2(L) 6.5  Total Bilirubin 0.3 - 1.2 mg/dL <0.1(L) 0.2(L) 0.2(L)  Alkaline Phos 38 - 126 U/L 210(H) 54 63  AST 15 - 41 U/L 31 13(L) 12(L)  ALT 0 - 44 U/L '31 9 10   ' Lipid Panel     Component Value Date/Time   CHOL 108 07/28/2019 0553   TRIG 130 07/28/2019 0553   HDL 18 (L) 07/28/2019 0553   CHOLHDL 6.0 07/28/2019 0553   VLDL 26 07/28/2019 0553   LDLCALC 64 07/28/2019 0553    CBC    Component Value Date/Time   WBC 7.3 10/19/2019 1708   WBC 4.0 08/27/2019 0344   RBC 4.85 10/19/2019 1708   RBC 2.75 (L) 08/27/2019 0344   HGB 11.9 (L) 10/19/2019 1708   HCT 38.8 10/19/2019 1708   PLT 286 10/19/2019 1708   MCV 80 10/19/2019 1708   MCH 24.5 (L) 10/19/2019 1708   MCH 27.3 08/27/2019 0344   MCHC 30.7 (L) 10/19/2019 1708   MCHC 29.1 (L)  08/27/2019 0344   RDW 14.2 10/19/2019 1708   LYMPHSABS 1.5 08/27/2019 0344   MONOABS 0.5 08/27/2019 0344   EOSABS 0.3 08/27/2019 0344   BASOSABS 0.0 08/27/2019 0344    ASSESSMENT AND PLAN:  1. Type 2 diabetes mellitus with diabetic polyneuropathy, with long-term current use of insulin (HCC) Not at goal. Dietary counseling given.  Discussed having him see a dietitian for counseling.  He feels this would be helpful.  Encouraged him to apply for the orange card/cone discount so that we can refer to a dietitian.  He is agreeable to doing so. Encouraged him to try to move more.  He will [facility] to join the [facility] or the gym that is close to his house if he can afford it. -Initially I had thought about putting him on Trulicity but given his recent episode  of vomiting I think I would hold off on that for now.  Instead he is agreeable to being placed on Metformin which he states he was on before and tolerated okay.  Continue the Novolin and NovoLog insulin. -For his neuropathy symptoms, he is agreeable to restarting a low-dose of gabapentin. -Encouraged him to get his diabetic eye exam done when he is able to afford. - Glucose (CBG) - HgB A1c - CBC - Comprehensive metabolic panel - Lipid panel - insulin NPH Human (NOVOLIN N) 100 UNIT/ML injection; Inject 0.28 mLs (28 Units total) into the skin 2 (two) times daily at 8 am and 10 pm.  Dispense: 10 mL; Refill: 11 - metFORMIN (GLUCOPHAGE) 500 MG tablet; Take 1 tablet (500 mg total) by mouth 2 (two) times daily with a meal.  Dispense: 60 tablet; Refill: 5 - gabapentin (NEURONTIN) 300 MG capsule; Take 1 capsule (300 mg total) by mouth at bedtime.  Dispense: 30 capsule; Refill: 6 - insulin aspart (NOVOLOG) 100 UNIT/ML FlexPen; Inject 8 Units into the skin 3 (three) times daily with meals.  Dispense: 15 mL; Refill: 11  2. Essential hypertension Not at goal.  Increase hydralazine to 50 mg twice a day.  Follow-up with clinical pharmacist in 1 week for repeat blood  pressure check. - hydrALAZINE (APRESOLINE) 50 MG tablet; Take 1 tablet (50 mg total) by mouth 2 (two) times daily.  Dispense: 60 tablet; Refill: 6  3. Lesion of skin of nose Is to see a dermatologist to make sure that this is not a basal cell lesion. - Ambulatory referral to Dermatology  4. Colon cancer screening - Fecal occult blood, imunochemical([facility])  5. Need for influenza vaccination Given today.  6. Morbid obesity ([place]) See #1 above  7. Decreased energy Likely due to deconditioning.  Encouraged him to join the [facility] or the gym if he can afford it.  Also advised him to apply for the orange card/cone discount as we may be able to refer him to physical therapy - TSH  8. Current moderate episode of major depressive disorder without prior episode ([place]) Refill given on Cymbalta.  Depression screen positive but his PHQ score less than previous. - DULoxetine (CYMBALTA) 20 MG capsule; Take 2 capsules (40 mg total) by mouth at bedtime.  Dispense: 60 capsule; Refill: 2    Patient was given the opportunity to ask questions.  Patient verbalized understanding of the plan and was able to repeat key elements of the plan.   Orders Placed This Encounter  Procedures  . Fecal occult blood, imunochemical([facility]/Sun[facility])  . CBC  . Comprehensive metabolic panel  . Lipid panel  . TSH  . Ambulatory referral to Dermatology  . Glucose (CBG)  . HgB A1c     Requested Prescriptions   Signed Prescriptions Disp Refills  . insulin NPH Human (NOVOLIN N) 100 UNIT/ML injection 10 mL 11    Sig: Inject 0.28 mLs (28 Units total) into the skin 2 (two) times daily at 8 am and 10 pm.  . metFORMIN (GLUCOPHAGE) 500 MG tablet 60 tablet 5    Sig: Take 1 tablet (500 mg total) by mouth 2 (two) times daily with a meal.  . hydrALAZINE (APRESOLINE) 50 MG tablet 60 tablet 6    Sig: Take 1 tablet (50 mg total) by mouth 2 (two) times daily.  [name] Kitchen gabapentin (NEURONTIN) 300 MG capsule 30 capsule 6     Sig: Take 1 capsule (300 mg total) by mouth at bedtime.  . DULoxetine (CYMBALTA) 20 MG  capsule 60 capsule 2    Sig: Take 2 capsules (40 mg total) by mouth at bedtime.  . insulin aspart (NOVOLOG) 100 UNIT/ML FlexPen 15 mL 11    Sig: Inject 8 Units into the skin 3 (three) times daily with meals.    Return in about 3 months (around 09/30/2020) for [facility] in 1 week for follow up blood pressure.Karle Plumber, MD, FACP

## 2020-07-02 ENCOUNTER — Other Ambulatory Visit: Payer: Self-pay | Admitting: Internal Medicine

## 2020-07-02 LAB — CBC
Hematocrit: 49.4 % (ref 37.5–51.0)
Hemoglobin: 16.6 g/dL (ref 13.0–17.7)
MCH: 29.9 pg (ref 26.6–33.0)
MCHC: 33.6 g/dL (ref 31.5–35.7)
MCV: 89 fL (ref 79–97)
Platelets: 171 10*3/uL (ref 150–450)
RBC: 5.55 x10E6/uL (ref 4.14–5.80)
RDW: 13 % (ref 11.6–15.4)
WBC: 7.1 10*3/uL (ref 3.4–10.8)

## 2020-07-02 LAB — COMPREHENSIVE METABOLIC PANEL
ALT: 23 IU/L (ref 0–44)
AST: 18 IU/L (ref 0–40)
Albumin/Globulin Ratio: 1.6 (ref 1.2–2.2)
Albumin: 4.8 g/dL (ref 4.0–5.0)
Alkaline Phosphatase: 118 IU/L (ref 48–121)
BUN/Creatinine Ratio: 16 (ref 9–20)
BUN: 12 mg/dL (ref 6–24)
Bilirubin Total: 0.5 mg/dL (ref 0.0–1.2)
CO2: 26 mmol/L (ref 20–29)
Calcium: 10.2 mg/dL (ref 8.7–10.2)
Chloride: 98 mmol/L (ref 96–106)
Creatinine, Ser: 0.73 mg/dL — ABNORMAL LOW (ref 0.76–1.27)
GFR calc Af Amer: 125 mL/min/{1.73_m2} (ref >59)
GFR calc non Af Amer: 108 mL/min/{1.73_m2} (ref >59)
Globulin, Total: 3 g/dL (ref 1.5–4.5)
Glucose: 256 mg/dL — ABNORMAL HIGH (ref 65–99)
Potassium: 4.3 mmol/L (ref 3.5–5.2)
Sodium: 138 mmol/L (ref 134–144)
Total Protein: 7.8 g/dL (ref 6.0–8.5)

## 2020-07-02 LAB — LIPID PANEL
Chol/HDL Ratio: 6 ratio — ABNORMAL HIGH (ref 0.0–5.0)
Cholesterol, Total: 239 mg/dL — ABNORMAL HIGH (ref 100–199)
HDL: 40 mg/dL (ref >39)
LDL Chol Calc (NIH): 143 mg/dL — ABNORMAL HIGH (ref 0–99)
Triglycerides: 305 mg/dL — ABNORMAL HIGH (ref 0–149)
VLDL Cholesterol Cal: 56 mg/dL — ABNORMAL HIGH (ref 5–40)

## 2020-07-02 LAB — TSH: TSH: 0.992 u[IU]/mL (ref 0.450–4.500)

## 2020-07-02 MED ORDER — ATORVASTATIN CALCIUM 10 MG PO TABS: 10.0000 mg | ORAL_TABLET | Freq: Every day | ORAL | 4 refills | Status: DC

## 2020-07-02 MED FILL — ATORVASTATIN 10 MG TABLET: 10 | 30 days supply | Qty: 30 | Fill #0

## 2020-07-02 NOTE — Progress Notes (Signed)
Let patient know that his blood count is normal.  He is no longer anemic.  Kidney and liver function tests are normal.  His total and LDL cholesterol are elevated.  Total cholesterol is 239 with goal being less than 200.  LDL cholesterol is 143 with goal being less than 70.  High cholesterol increases his risk for heart attack and strokes.  I recommend starting a medication called atorvastatin to help lower cholesterol.  The prescription has been sent to our pharmacy.   The rest of this is for my info. The 10-year ASCVD risk score Mikey Bussing DC Brooke Bonito., et al., 2013) is: 16.3%   Values used to calculate the score:     Age: 49 years     Sex: Male     Is Non-Hispanic African American: No     Diabetic: Yes     Tobacco smoker: No     Systolic Blood Pressure: 660 mmHg     Is BP treated: Yes     HDL Cholesterol: 40 mg/dL     Total Cholesterol: 239 mg/dL

## 2020-07-03 ENCOUNTER — Telehealth: Payer: Self-pay

## 2020-07-03 NOTE — Telephone Encounter (Signed)
Contacted pt to go over lab results pt didn't answer and was unable to lvm due to vm being full

## 2020-07-08 ENCOUNTER — Ambulatory Visit: Payer: Self-pay | Admitting: Pharmacist

## 2020-07-09 NOTE — Progress Notes (Signed)
    S:    Patient arrives in good spirits. Presents to the clinic for hypertension evaluation, counseling, and management. Patient was referred and last seen by Primary Care Provider on 07/01/20.   Today, patient reports that he has a lot of stressors going on currently. He denies side effects with any medications but reports that he continues to have swelling in his right leg where he had the infection/surgeries. He states it is not any worse lately. He also reports that he has had a headache ever since he got his flu shot last week. Medication adherence reported as daily.  Current BP Medications include:  Hydralazine 50 mg BID -Denies adverse effects. Denies dizziness, blurred vision.   Antihypertensives tried in the past include: lisinopril - patient reports he had a bad cough with this and stopped it  Dietary habits include:  - Breakfast: yogurt and granola with fruit - Lunch: peanut butter and banana sandwich, doesn't eat usually - Dinner: protein with vegetable with starch - Snacks: none, celery, peppers, cucumber - Drinks: 1-2 cups of coffee, water throughout day Exercise habits include: Some walking, manages paintball teams Family / Social history:  - Tobacco use: never smoker - FHx: diabetes  ASCVD risk factors include:hypertension, hyperlipidemia, diabetes   O:  BP in clinic: 132/94  Home BP readings: does not check BP at home  Last 3 Office BP readings: BP Readings from Last 3 Encounters:  07/10/20 (!) 132/94  07/01/20 (!) 152/108  10/19/19 125/88    BMET    Component Value Date/Time   NA 138 07/01/2020 1633   K 4.3 07/01/2020 1633   CL 98 07/01/2020 1633   CO2 26 07/01/2020 1633   GLUCOSE 256 (H) 07/01/2020 1633   GLUCOSE 146 (H) 08/27/2019 0344   BUN 12 07/01/2020 1633   CREATININE 0.73 (L) 07/01/2020 1633   CALCIUM 10.2 07/01/2020 1633   GFRNONAA 108 07/01/2020 1633   GFRAA 125 07/01/2020 1633    Renal function: CrCl cannot be calculated (Unknown  ideal weight.).  Clinical ASCVD: No  The 10-year ASCVD risk score Mikey Bussing DC Jr., et al., 2013) is: 16.3%   Values used to calculate the score:     Age: 49 years     Sex: Male     Is Non-Hispanic African American: No     Diabetic: Yes     Tobacco smoker: No     Systolic Blood Pressure: 202 mmHg     Is BP treated: Yes     HDL Cholesterol: 40 mg/dL     Total Cholesterol: 239 mg/dL  A/P: Hypertension diagnosed approximately 10 years ago, currently slightly uncontrolled on current medications. BP Goal = < 130/80 mmHg. Reported medication adherence is appropriate. Patient has diabetes with borderline UACR of 26, and renal function and electrolytes are ok. Therefore, patient likely to benefit from addition of RAAS therapy, with goal of reducing hydralazine dose over time if blood pressure control is maintained on losartan.  -Continue hydralazine 50 mg BID -Start losartan 50 mg daily -Will check BMP at next visit -Counseled patient to buy a blood pressure cuff and check his blood pressure daily at home -Counseled on lifestyle modifications for blood pressure control including reduced dietary sodium, increased exercise, adequate sleep. -Reviewed blood pressure goals  Results reviewed and written information provided.   Total time in face-to-face counseling 30 minutes.   F/U Clinic Visit in 1 month.     Rebbeca Paul, PharmD PGY1 Pharmacy Resident 07/10/2020 3:26 PM

## 2020-07-10 ENCOUNTER — Ambulatory Visit: Payer: Self-pay | Attending: Internal Medicine | Admitting: Pharmacist

## 2020-07-10 ENCOUNTER — Other Ambulatory Visit: Payer: Self-pay | Admitting: Internal Medicine

## 2020-07-10 ENCOUNTER — Other Ambulatory Visit: Payer: Self-pay

## 2020-07-10 VITALS — BP 132/94 | HR 96

## 2020-07-10 DIAGNOSIS — I10 Essential (primary) hypertension: Secondary | ICD-10-CM

## 2020-07-10 MED ORDER — LOSARTAN POTASSIUM 50 MG PO TABS: 50.0000 mg | ORAL_TABLET | Freq: Every day | ORAL | 2 refills | Status: DC

## 2020-07-10 MED FILL — LOSARTAN POTASSIUM 50 MG TA: 50 | 30 days supply | Qty: 30 | Fill #0

## 2020-07-11 ENCOUNTER — Encounter: Payer: Self-pay | Admitting: Pharmacist

## 2020-08-07 ENCOUNTER — Ambulatory Visit: Payer: Self-pay | Attending: Internal Medicine | Admitting: Pharmacist

## 2020-08-07 ENCOUNTER — Encounter: Payer: Self-pay | Admitting: Pharmacist

## 2020-08-07 ENCOUNTER — Other Ambulatory Visit: Payer: Self-pay

## 2020-08-07 VITALS — BP 125/82 | HR 86

## 2020-08-07 DIAGNOSIS — I10 Essential (primary) hypertension: Secondary | ICD-10-CM

## 2020-08-07 MED FILL — LOSARTAN POTASSIUM 50 MG TA: 50 | 30 days supply | Qty: 30 | Fill #1

## 2020-08-07 MED FILL — DULoxetine HCL 20 MG CPEP: 20 | 30 days supply | Qty: 60 | Fill #1

## 2020-08-07 MED FILL — hydrALAZINE HCL 50 MG TABS: 50 | 30 days supply | Qty: 60 | Fill #1

## 2020-08-07 MED FILL — METFORMIN HCL 500 MG TABS: 500 | 30 days supply | Qty: 60 | Fill #1

## 2020-08-07 MED FILL — ATORVASTATIN 10 MG TABLET: 10 | 30 days supply | Qty: 30 | Fill #1

## 2020-08-07 NOTE — Progress Notes (Signed)
    S:    Patient arrives in good spirits. Presents to the clinic for hypertension evaluation, counseling, and management. Patient was referred and last seen by Primary Care Provider on 07/01/20. We saw him on 07/10/2020 and started losartan.   Medication adherence reported.   Patient denies medication side effects. No chest pains or dyspnea. No HA, blurred vision.   Current BP Medications include:  Hydralazine 50 mg BID, losartan 50 mg daily  Antihypertensives tried in the past include: lisinopril - patient reports he had a bad cough with this and stopped it  Dietary habits include:  - Breakfast: yogurt and granola with fruit - Lunch: peanut butter and banana sandwich, doesn't eat usually - Dinner: protein with vegetable with starch - Snacks: none, celery, peppers, cucumber - Drinks: 1-2 cups of coffee, water throughout day Exercise habits include: Some walking, manages paintball teams Family / Social history:  - Tobacco use: never smoker - FHx: diabetes  ASCVD risk factors include:hypertension, hyperlipidemia, diabetes   O:  Vitals:   08/07/20 1619  BP: 125/82  Pulse: 86    Home BP readings: does not check BP at home  Last 3 Office BP readings: BP Readings from Last 3 Encounters:  08/07/20 125/82  07/10/20 (!) 132/94  07/01/20 (!) 152/108    BMET    Component Value Date/Time   NA 138 07/01/2020 1633   K 4.3 07/01/2020 1633   CL 98 07/01/2020 1633   CO2 26 07/01/2020 1633   GLUCOSE 256 (H) 07/01/2020 1633   GLUCOSE 146 (H) 08/27/2019 0344   BUN 12 07/01/2020 1633   CREATININE 0.73 (L) 07/01/2020 1633   CALCIUM 10.2 07/01/2020 1633   GFRNONAA 108 07/01/2020 1633   GFRAA 125 07/01/2020 1633    Renal function: CrCl cannot be calculated (Patient's most recent lab result is older than the maximum 21 days allowed.).  Clinical ASCVD: No  The 10-year ASCVD risk score Mikey Bussing DC Jr., et al., 2013) is: 12.8%   Values used to calculate the score:     Age: 57 years      Sex: Male     Is Non-Hispanic African American: No     Diabetic: Yes     Tobacco smoker: No     Systolic Blood Pressure: 517 mmHg     Is BP treated: Yes     HDL Cholesterol: 40 mg/dL     Total Cholesterol: 239 mg/dL  A/P: Hypertension longstanding currently at goal. BP Goal = < 130/80 mmHg. Reported medication adherence is appropriate.  -Continue current regimen. -Recommend to check BMP at f/u with Dr. Wynetta Emery.  -Counseled on lifestyle modifications for blood pressure control including reduced dietary sodium, increased exercise, adequate sleep. -Reviewed blood pressure goals  Results reviewed and written information provided.   Total time in face-to-face counseling 30 minutes.   F/U Clinic Visit in November with Dr. Wynetta Emery.   Benard Halsted, PharmD, [place] [phone]

## 2020-09-02 ENCOUNTER — Other Ambulatory Visit: Payer: Self-pay | Admitting: Internal Medicine

## 2020-09-02 DIAGNOSIS — L02415 Cutaneous abscess of right lower limb: Secondary | ICD-10-CM

## 2020-09-02 MED FILL — DULoxetine HCL 20 MG CPEP: 20 | 30 days supply | Qty: 60 | Fill #2

## 2020-09-02 NOTE — Telephone Encounter (Signed)
Requested medication (s) are due for refill today: yes  Requested medication (s) are on the active medication list: yes  Last refill:  05/30/20  Future visit scheduled: yes  Notes to clinic:  med not delegated to NT to RF   Requested Prescriptions  Pending Prescriptions Disp Refills   methocarbamol (ROBAXIN) 500 MG tablet [Pharmacy Med Name: METHOCARBAMOL 500 MG TABS 500 Tablet] 120 tablet 0    Sig: TAKE 2 TABLETS (1,000 MG TOTAL) BY MOUTH 4 (FOUR) TIMES DAILY.      Not Delegated - Analgesics:  Muscle Relaxants Failed - 09/02/2020  3:42 PM      Failed - This refill cannot be delegated      Passed - Valid encounter within last 6 months    Recent Outpatient Visits           3 weeks ago Essential hypertension   [place], RPH-CPP   1 month ago Essential hypertension   [place], Jarome Matin, RPH-CPP   2 months ago Need for influenza vaccination   [place], Stephen L, RPH-CPP   2 months ago Type 2 diabetes mellitus with diabetic polyneuropathy, with long-term current use of insulin [facility])   [place] Karle Plumber B, MD   6 months ago Type 2 diabetes mellitus without complication, with long-term current use of insulin [facility])   [place], MD       Future Appointments             In 4 weeks Ladell Pier, MD [place]

## 2020-09-03 ENCOUNTER — Other Ambulatory Visit: Payer: Self-pay | Admitting: Internal Medicine

## 2020-09-03 MED FILL — METHOCARBAMOL 500 MG TABS: 500 | 15 days supply | Qty: 120 | Fill #0

## 2020-09-03 MED FILL — $Novolin N 100u/ml Vial: 100 | 35 days supply | Qty: 20 | Fill #1

## 2020-09-23 MED FILL — LOSARTAN POTASSIUM 50 MG TA: 50 | 30 days supply | Qty: 30 | Fill #2

## 2020-09-23 MED FILL — hydrALAZINE HCL 50 MG TABS: 50 | 30 days supply | Qty: 60 | Fill #2

## 2020-09-23 MED FILL — ATORVASTATIN 10 MG TABLET: 10 | 30 days supply | Qty: 30 | Fill #2

## 2020-09-23 MED FILL — METFORMIN HCL 500 MG TABS: 500 | 30 days supply | Qty: 60 | Fill #2

## 2020-09-30 ENCOUNTER — Other Ambulatory Visit: Payer: Self-pay | Admitting: Internal Medicine

## 2020-09-30 ENCOUNTER — Ambulatory Visit: Payer: Self-pay | Attending: Internal Medicine | Admitting: Internal Medicine

## 2020-09-30 ENCOUNTER — Other Ambulatory Visit: Payer: Self-pay

## 2020-09-30 DIAGNOSIS — E785 Hyperlipidemia, unspecified: Secondary | ICD-10-CM

## 2020-09-30 DIAGNOSIS — Z794 Long term (current) use of insulin: Secondary | ICD-10-CM

## 2020-09-30 DIAGNOSIS — F324 Major depressive disorder, single episode, in partial remission: Secondary | ICD-10-CM

## 2020-09-30 DIAGNOSIS — E1169 Type 2 diabetes mellitus with other specified complication: Secondary | ICD-10-CM

## 2020-09-30 DIAGNOSIS — E1142 Type 2 diabetes mellitus with diabetic polyneuropathy: Secondary | ICD-10-CM

## 2020-09-30 DIAGNOSIS — I10 Essential (primary) hypertension: Secondary | ICD-10-CM

## 2020-09-30 MED ORDER — LOSARTAN POTASSIUM 50 MG PO TABS: 50.0000 mg | ORAL_TABLET | Freq: Every day | ORAL | 6 refills | Status: DC

## 2020-09-30 MED ORDER — GABAPENTIN 300 MG PO CAPS: ORAL_CAPSULE | ORAL | 6 refills | Status: DC

## 2020-09-30 NOTE — Progress Notes (Signed)
Virtual Visit via Telephone Note  I connected with Bradley Cox on 09/30/20 at 3:49 p.m by telephone and verified that I am speaking with the correct person using two identifiers.  Location: Patient: home Provider: office Only pt, myself and CMA participated in this encounter I discussed the limitations, risks, security and privacy concerns of performing an evaluation and management service by telephone and the availability of in person appointments. I also discussed with the patient that there may be a patient responsible charge related to this service. The patient expressed understanding and agreed to proceed.   History of Present Illness: Patient with history of DM type II, obesity, HTN,  DVT right leg,asthma. MSSAbacteremia, depression.   Patient reports that about 2 to 3 weeks ago he did notice some pustules that he describes as "bubbles" appeared on the lower edge of healed incisions on his right buttock and right leg.  They itched.  They eventually popped and clear fluid was expressed. Nothing further.   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  DM: reports compliance with NPH 30 units BID and more consistent with taking Novolog 8 units TID with meals.  Out of Metformin x 1 wk -on last visit A1C 9.6, Metformin added -reports he is eating less.  Drinks more water and milk -has not join the [employer].  Can not afford but walking more on wkends.  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  HTN: saw clinical pharmacist since last visit.  cozaar added. Compliant with Cozaar and Hydralazine.  Trying to limit salt.  No chest pains or shortness of breath.  HL: LDL on recent blood test was not at goal.  I recommend adding Lipitor.  He has been taking and tolerating the medication.  Depression: "some days are good.  Some days bad."  Anxious a lot.  Not sleeping well.  On Cymbalta. RT leg does not feel right  most of the times.  Both feet bothers him all day.  Not so bad when walking on gravel but when on flat surfaces, pain/numbness worse.  Can walk about 1-1.5 hrs before he has to give it up. Gabapentin helps but feels higher dose would be more beneficial Outpatient Encounter Medications as of 09/30/2020  Medication Sig  . acetaminophen (TYLENOL) 325 MG tablet Take 2 tablets (650 mg total) by mouth every 6 (six) hours as needed for mild pain.  [name] Kitchen albuterol (VENTOLIN HFA) 108 (90 Base) MCG/ACT inhaler Inhale 1 puff into the lungs every 6 (six) hours as needed for wheezing or shortness of breath.   [name] Kitchen atorvastatin (LIPITOR) 10 MG tablet Take 1 tablet (10 mg total) by mouth daily.  . Blood Glucose Monitoring Suppl (TRUE METRIX METER) w/Device KIT Use as directed  . DULoxetine (CYMBALTA) 20 MG capsule Take 2 capsules (40 mg total) by mouth at bedtime.  . gabapentin (NEURONTIN) 300 MG capsule Take 1 capsule (300 mg total) by mouth at bedtime.  [name] Kitchen glucose blood (TRUE METRIX BLOOD GLUCOSE TEST) test strip Use as instructed  . hydrALAZINE (APRESOLINE) 50 MG tablet Take 1 tablet (50 mg total) by mouth 2 (two) times daily.  . insulin aspart (NOVOLOG) 100 UNIT/ML FlexPen Inject 8 Units into the skin 3 (three) times daily with meals.  . insulin NPH Human (NOVOLIN N) 100 UNIT/ML injection Inject 0.28 mLs (28 Units total) into the skin 2 (two) times daily at 8 am and  10 pm.  . Insulin Syringe-Needle U-100 (BD INSULIN SYRINGE ULTRAFINE) 31G X 15/64" 1 ML MISC Use as directed  . losartan (COZAAR) 50 MG tablet Take 1 tablet (50 mg total) by mouth daily.  . metFORMIN (GLUCOPHAGE) 500 MG tablet Take 1 tablet (500 mg total) by mouth 2 (two) times daily with a meal.  . methocarbamol (ROBAXIN) 500 MG tablet TAKE 2 TABLETS (1,000 MG TOTAL) BY MOUTH 4 (FOUR) TIMES DAILY.  . Multiple Vitamin (THERA) TABS Take 2 tablets by mouth daily.  . TRUEplus Lancets 28G MISC Use as directed   No facility-administered encounter medications  on file as of 09/30/2020.    Observations/Objective: Depression screen [facility] 2/9 09/30/2020 07/01/2020 11/30/2019  Decreased Interest 0 2 -  Down, Depressed, Hopeless _0 PHQ - 2 Score _1 Altered sleeping 1 3 0  Tired, decreased energy _2 Change in appetite _3 Feeling bad or failure about yourself  _4 Trouble concentrating _5 Moving slowly or fidgety/restless 0 0 3  Suicidal thoughts 0 0 0  PHQ-9 Score _6 GAD 7 : Generalized Anxiety Score 09/30/2020 07/01/2020 11/30/2019 10/19/2019  Nervous, Anxious, on Edge _7 Control/stop worrying _8 Worry too much - different things _9 Trouble relaxing _10 Restless _11 Easily annoyed or irritable _12 Afraid - awful might happen _13 Total GAD 7 Score _14 Results for orders placed or performed in visit on 07/01/20  CBC  Result Value Ref Range   WBC 7.1 3.4 - 10.8 x10E3/uL   RBC 5.55 4.14 - 5.80 x10E6/uL   Hemoglobin 16.6 13.0 - 17.7 g/dL   Hematocrit 49.4 37.5 - 51.0 %   MCV 89 79 - 97 fL   MCH 29.9 26.6 - 33.0 pg   MCHC 33.6 31 - 35 g/dL   RDW 13.0 11.6 - 15.4 %   Platelets 171 150 - 450 x10E3/uL  Comprehensive metabolic panel  Result Value Ref Range   Glucose 256 (H) 65 - 99 mg/dL   BUN 12 6 - 24 mg/dL   Creatinine, Ser 0.73 (L) 0.76 - 1.27 mg/dL   GFR calc non Af Amer 108 >59 mL/min/1.73   GFR calc Af Amer 125 >59 mL/min/1.73   BUN/Creatinine Ratio 16 9 - 20   Sodium 138 134 - 144 mmol/L   Potassium 4.3 3.5 - 5.2 mmol/L   Chloride 98 96 - 106 mmol/L   CO2 26 20 - 29 mmol/L   Calcium 10.2 8.7 - 10.2 mg/dL   Total Protein 7.8 6.0 - 8.5 g/dL   Albumin 4.8 4.0 - 5.0 g/dL   Globulin, Total 3.0 1.5 - 4.5 g/dL   Albumin/Globulin Ratio 1.6 1.2 - 2.2   Bilirubin Total 0.5 0.0 - 1.2 mg/dL   Alkaline Phosphatase 118 48 - 121 IU/L   AST 18 0 - 40 IU/L   ALT 23 0 - 44 IU/L  Lipid panel  Result Value Ref Range   Cholesterol, Total 239 (H) 100 - 199 mg/dL    Triglycerides 305 (H) 0 - 149 mg/dL   HDL 40 >39 mg/dL   VLDL Cholesterol Cal 56 (H) 5 - 40 mg/dL   LDL Chol Calc (NIH) 143 (H) 0 - 99 mg/dL  Chol/HDL Ratio 6.0 (H) 0.0 - 5.0 ratio  TSH  Result Value Ref Range   TSH 0.992 0.450 - 4.500 uIU/mL  Glucose (CBG)  Result Value Ref Range   POC Glucose 256 (A) 70 - 99 mg/dl  HgB A1c  Result Value Ref Range   Hemoglobin A1C 9.6 (A) 4.0 - 5.6 %   HbA1c POC (<> result, manual entry)     HbA1c, POC (prediabetic range)     HbA1c, POC (controlled diabetic range)      Assessment and Plan: 1. Type 2 diabetes mellitus with diabetic polyneuropathy, with long-term current use of insulin ([place]) Patient will come to the lab to have A1c checked.  Continue Metformin and both insulins.  Continue to encourage healthy eating habits.  Encouraged him to move more.  Increase gabapentin to 300 mg in the morning and 600 mg at night. - Hemoglobin A1c; Future - gabapentin (NEURONTIN) 300 MG capsule; 1 cap PO Q a.m and 2 cap PO Q p.m  Dispense: 90 capsule; Refill: 6  2. Morbid obesity ([place]) See #1 above  3. Essential hypertension Last blood pressure check by the clinical pharmacist last month was at goal.  Continue Cozaar and hydralazine - losartan (COZAAR) 50 MG tablet; Take 1 tablet (50 mg total) by mouth daily.  Dispense: 30 tablet; Refill: 6  4. Hyperlipidemia associated with type 2 diabetes mellitus (HCC) Continue atorvastatin  5. Major depressive disorder with single episode, in partial remission (HCC) Continue Cymbalta  6.  Advised to keep an eye on the incisions on his legs.  He will apply for the orange card/cone discount.  Once approved, we can refer him to wound clinic for them to take a look at his incisions.  Follow Up Instructions: 4 months   I discussed the assessment and treatment plan with the patient. The patient was provided an opportunity to ask questions and all were answered. The patient agreed with the plan and demonstrated an  understanding of the instructions.   The patient was advised to call back or seek an in-person evaluation if the symptoms worsen or if the condition fails to improve as anticipated.  I provided 22 minutes of non-face-to-face time during this encounter.   Karle Plumber, MD

## 2020-09-30 NOTE — Patient Instructions (Signed)

## 2020-10-01 MED FILL — GABAPENTIN 300 MG CAPSULE: 300 | 30 days supply | Qty: 90 | Fill #0

## 2020-10-01 MED FILL — LOSARTAN POTASSIUM 50 MG TA: 50 | 30 days supply | Qty: 30 | Fill #0

## 2020-10-02 MED FILL — ATORVASTATIN 10 MG TABLET: 10 | 30 days supply | Qty: 30 | Fill #2

## 2020-10-02 MED FILL — hydrALAZINE HCL 50 MG TABS: 50 | 30 days supply | Qty: 60 | Fill #2

## 2020-10-02 MED FILL — METFORMIN HCL 500 MG TABS: 500 | 30 days supply | Qty: 60 | Fill #2

## 2020-10-07 MED FILL — DULoxetine HCL 20 MG CPEP: 20 | 30 days supply | Qty: 60 | Fill #0

## 2020-11-11 MED FILL — hydrALAZINE HCL 50 MG TABS: 50 | 30 days supply | Qty: 60 | Fill #3

## 2020-11-11 MED FILL — $Novolin N 100u/ml Vial: 100 | 35 days supply | Qty: 20 | Fill #2

## 2020-11-11 MED FILL — METFORMIN HCL 500 MG TABS: 500 | 30 days supply | Qty: 60 | Fill #3

## 2020-11-11 MED FILL — LOSARTAN POTASSIUM 50 MG TA: 50 | 30 days supply | Qty: 30 | Fill #1

## 2020-11-11 MED FILL — DULoxetine HCL 20 MG CPEP: 20 | 30 days supply | Qty: 60 | Fill #1

## 2020-11-11 MED FILL — ATORVASTATIN 10 MG TABLET: 10 | 30 days supply | Qty: 30 | Fill #3

## 2020-12-12 MED FILL — DULoxetine HCL 20 MG CPEP: 20 | 30 days supply | Qty: 60 | Fill #2

## 2020-12-12 MED FILL — hydrALAZINE HCL 50 MG TABS: 50 | 30 days supply | Qty: 60 | Fill #4

## 2020-12-12 MED FILL — LOSARTAN POTASSIUM 50 MG TA: 50 | 30 days supply | Qty: 30 | Fill #2

## 2020-12-12 MED FILL — ATORVASTATIN 10 MG TABLET: 10 | 30 days supply | Qty: 30 | Fill #4

## 2020-12-12 MED FILL — METFORMIN HCL 500 MG TABS: 500 | 30 days supply | Qty: 60 | Fill #4

## 2020-12-15 ENCOUNTER — Telehealth: Payer: Self-pay | Admitting: Internal Medicine

## 2020-12-15 NOTE — Telephone Encounter (Signed)
Patient's mother Jackelyn Poling came by. She stated her son is not sleeping much. He is also have a lot of pain shooting down his right leg where the incision for MSSA was. She also the incision is blistering up. She believe the MSSA is coming back but she is not sure. The mother asked if Dr. Wynetta Emery would giver her a call or call her son at [phone].

## 2020-12-15 NOTE — Telephone Encounter (Signed)
Contacted pt to get more information pt didn't answer. Was unable to lvm due to vm being full  Will forward to provider

## 2020-12-16 NOTE — Telephone Encounter (Signed)
Please contact pt and schedule an appt with any available provider

## 2020-12-19 NOTE — Telephone Encounter (Signed)
Called patient but no answer and unable to leave voicemail

## 2021-01-06 ENCOUNTER — Other Ambulatory Visit: Payer: Self-pay | Admitting: Internal Medicine

## 2021-01-06 DIAGNOSIS — L02415 Cutaneous abscess of right lower limb: Secondary | ICD-10-CM

## 2021-01-06 DIAGNOSIS — F321 Major depressive disorder, single episode, moderate: Secondary | ICD-10-CM

## 2021-01-06 MED FILL — $novoLOG FLEXPEN SYRINGE: 100 | 25 days supply | Qty: 6 | Fill #1

## 2021-01-06 MED FILL — $Novolin N 100u/ml Vial: 100 | 35 days supply | Qty: 20 | Fill #3

## 2021-01-06 MED FILL — LOSARTAN POTASSIUM 50 MG TA: 50 | 30 days supply | Qty: 30 | Fill #3

## 2021-01-06 MED FILL — GABAPENTIN 300 MG CAPSULE: 300 | 30 days supply | Qty: 90 | Fill #1

## 2021-01-06 MED FILL — hydrALAZINE HCL 50 MG TABS: 50 | 30 days supply | Qty: 60 | Fill #5

## 2021-01-06 MED FILL — METFORMIN HCL 500 MG TABS: 500 | 30 days supply | Qty: 60 | Fill #5

## 2021-01-06 NOTE — Telephone Encounter (Signed)
Requested Prescriptions  Pending Prescriptions Disp Refills  . atorvastatin (LIPITOR) 10 MG tablet [Pharmacy Med Name: ATORVASTATIN 10 MG TABLET 10 Tablet] 90 tablet 1    Sig: TAKE 1 TABLET (10 MG TOTAL) BY MOUTH DAILY.     Cardiovascular:  Antilipid - Statins Failed - 01/06/2021  4:49 PM      Failed - Total Cholesterol in normal range and within 360 days    Cholesterol, Total  Date Value Ref Range Status  07/01/2020 239 (H) 100 - 199 mg/dL Final         Failed - LDL in normal range and within 360 days    LDL Chol Calc (NIH)  Date Value Ref Range Status  07/01/2020 143 (H) 0 - 99 mg/dL Final         Failed - Triglycerides in normal range and within 360 days    Triglycerides  Date Value Ref Range Status  07/01/2020 305 (H) 0 - 149 mg/dL Final         Passed - HDL in normal range and within 360 days    HDL  Date Value Ref Range Status  07/01/2020 40 >39 mg/dL Final         Passed - Patient is not pregnant      Passed - Valid encounter within last 12 months    Recent Outpatient Visits          3 months ago Type 2 diabetes mellitus with diabetic polyneuropathy, with long-term current use of insulin ([place])   [place], Deborah B, MD   5 months ago Essential hypertension   [place], Jarome Matin, RPH-CPP   6 months ago Essential hypertension   [place], Jarome Matin, RPH-CPP   6 months ago Need for influenza vaccination   [place], Annie Main L, RPH-CPP   6 months ago Type 2 diabetes mellitus with diabetic polyneuropathy, with long-term current use of insulin ([place])   [place], Neoma Laming B, MD             . DULoxetine (CYMBALTA) 20 MG capsule [Pharmacy Med Name: DULoxetine HCL 20 MG CPEP 20 Capsule] 180 capsule 0    Sig: TAKE 2 CAPSULES (40 MG TOTAL) BY MOUTH AT BEDTIME.      Psychiatry: Antidepressants - SNRI Passed - 01/06/2021  4:49 PM      Passed - Completed PHQ-2 or PHQ-9 in the last 360 days      Passed - Last BP in normal range    BP Readings from Last 1 Encounters:  08/07/20 125/82         Passed - Valid encounter within last 6 months    Recent Outpatient Visits          3 months ago Type 2 diabetes mellitus with diabetic polyneuropathy, with long-term current use of insulin ([place])   [place] Ladell Pier, MD   5 months ago Essential hypertension   [place], Stephen L, RPH-CPP   6 months ago Essential hypertension   [place], Jarome Matin, RPH-CPP   6 months ago Need for influenza vaccination   [place], Annie Main L, RPH-CPP   6 months ago Type 2 diabetes mellitus with diabetic polyneuropathy, with  long-term current use of insulin ([place])   [place], Neoma Laming B, MD             . methocarbamol (ROBAXIN) 500 MG tablet [Pharmacy Med Name: METHOCARBAMOL 500 MG TABS 500 Tablet] 120 tablet     Sig: TAKE 2 TABLETS (1,000 MG TOTAL) BY MOUTH 4 (FOUR) TIMES DAILY.     Not Delegated - Analgesics:  Muscle Relaxants Failed - 01/06/2021  4:49 PM      Failed - This refill cannot be delegated      Passed - Valid encounter within last 6 months    Recent Outpatient Visits          3 months ago Type 2 diabetes mellitus with diabetic polyneuropathy, with long-term current use of insulin ([place])   [place] Ladell Pier, MD   5 months ago Essential hypertension   [place], Stephen L, RPH-CPP   6 months ago Essential hypertension   [place], Jarome Matin, RPH-CPP   6 months ago Need for influenza vaccination   [place], Stephen L, RPH-CPP   6 months ago Type 2 diabetes mellitus with diabetic polyneuropathy, with long-term current use of insulin [facility])   [place] [facility] Ladell Pier, MD

## 2021-01-06 NOTE — Telephone Encounter (Signed)
Requested medication (s) are due for refill today: yes  Requested medication (s) are on the active medication list: yes  Last refill:  09/03/20  Future visit scheduled: no  Notes to clinic:  med not delegated to NT to RF   Requested Prescriptions  Pending Prescriptions Disp Refills   methocarbamol (ROBAXIN) 500 MG tablet [Pharmacy Med Name: METHOCARBAMOL 500 MG TABS 500 Tablet] 120 tablet     Sig: TAKE 2 TABLETS (1,000 MG TOTAL) BY MOUTH 4 (FOUR) TIMES DAILY.      Not Delegated - Analgesics:  Muscle Relaxants Failed - 01/06/2021  4:49 PM      Failed - This refill cannot be delegated      Passed - Valid encounter within last 6 months    Recent Outpatient Visits           3 months ago Type 2 diabetes mellitus with diabetic polyneuropathy, with long-term current use of insulin ([place])   [place] Ladell Pier, MD   5 months ago Essential hypertension   [place], Stephen L, RPH-CPP   6 months ago Essential hypertension   [place], Jarome Matin, RPH-CPP   6 months ago Need for influenza vaccination   [place], Stephen L, RPH-CPP   6 months ago Type 2 diabetes mellitus with diabetic polyneuropathy, with long-term current use of insulin [facility])   [place] Ladell Pier, MD                 Signed Prescriptions Disp Refills   atorvastatin (LIPITOR) 10 MG tablet 90 tablet 1    Sig: TAKE 1 TABLET (10 MG TOTAL) BY MOUTH DAILY.      Cardiovascular:  Antilipid - Statins Failed - 01/06/2021  4:49 PM      Failed - Total Cholesterol in normal range and within 360 days    Cholesterol, Total  Date Value Ref Range Status  07/01/2020 239 (H) 100 - 199 mg/dL Final          Failed - LDL in normal range and within 360 days    LDL Chol Calc (NIH)  Date Value Ref Range Status  07/01/2020  143 (H) 0 - 99 mg/dL Final          Failed - Triglycerides in normal range and within 360 days    Triglycerides  Date Value Ref Range Status  07/01/2020 305 (H) 0 - 149 mg/dL Final          Passed - HDL in normal range and within 360 days    HDL  Date Value Ref Range Status  07/01/2020 40 >39 mg/dL Final          Passed - Patient is not pregnant      Passed - Valid encounter within last 12 months    Recent Outpatient Visits           3 months ago Type 2 diabetes mellitus with diabetic polyneuropathy, with long-term current use of insulin ([place])   [place], MD   5 months ago Essential hypertension   [place], Jarome Matin, RPH-CPP   6 months ago Essential hypertension   [place], Annie Main L, RPH-CPP   6 months ago Need for influenza vaccination  [place], RPH-CPP   6 months ago Type 2 diabetes mellitus with diabetic polyneuropathy, with long-term current use of insulin ([place])   [place], Dalbert [place], MD                  DULoxetine (CYMBALTA) 20 MG capsule 180 capsule 0    Sig: TAKE 2 CAPSULES (40 MG TOTAL) BY MOUTH AT BEDTIME.      Psychiatry: Antidepressants - SNRI Passed - 01/06/2021  4:49 PM      Passed - Completed PHQ-2 or PHQ-9 in the last 360 days      Passed - Last BP in normal range    BP Readings from Last 1 Encounters:  08/07/20 125/82          Passed - Valid encounter within last 6 months    Recent Outpatient Visits           3 months ago Type 2 diabetes mellitus with diabetic polyneuropathy, with long-term current use of insulin ([place])   [place] Ladell Pier, MD   5 months ago Essential hypertension   [place], Stephen L, RPH-CPP   6  months ago Essential hypertension   [place], Jarome Matin, RPH-CPP   6 months ago Need for influenza vaccination   [place], Stephen L, RPH-CPP   6 months ago Type 2 diabetes mellitus with diabetic polyneuropathy, with long-term current use of insulin [facility])   [place] [facility] Ladell Pier, MD

## 2021-01-07 MED FILL — DULoxetine HCL 20 MG CPEP: 20 | 30 days supply | Qty: 60 | Fill #0

## 2021-01-07 MED FILL — ATORVASTATIN 10 MG TABLET: 10 | 30 days supply | Qty: 30 | Fill #0

## 2021-03-11 ENCOUNTER — Other Ambulatory Visit: Payer: Self-pay | Admitting: Internal Medicine

## 2021-03-11 ENCOUNTER — Other Ambulatory Visit: Payer: Self-pay

## 2021-03-11 DIAGNOSIS — E1142 Type 2 diabetes mellitus with diabetic polyneuropathy: Secondary | ICD-10-CM

## 2021-03-11 DIAGNOSIS — Z794 Long term (current) use of insulin: Secondary | ICD-10-CM

## 2021-03-11 MED FILL — Atorvastatin Calcium Tab 10 MG (Base Equivalent): ORAL | 30 days supply | Qty: 30 | Fill #0 | Status: AC

## 2021-03-11 MED FILL — Duloxetine HCl Enteric Coated Pellets Cap 20 MG (Base Eq): ORAL | 30 days supply | Qty: 60 | Fill #0 | Status: AC

## 2021-03-11 MED FILL — Gabapentin Cap 300 MG: ORAL | 30 days supply | Qty: 90 | Fill #0 | Status: AC

## 2021-03-11 MED FILL — Hydralazine HCl Tab 50 MG: ORAL | 30 days supply | Qty: 60 | Fill #0 | Status: AC

## 2021-03-12 MED ORDER — METFORMIN HCL 500 MG PO TABS
ORAL_TABLET | Freq: Two times a day (BID) | ORAL | 0 refills | Status: DC
  Filled 2021-03-12: qty 60, 30d supply, fill #0

## 2021-03-13 ENCOUNTER — Other Ambulatory Visit: Payer: Self-pay

## 2021-05-21 ENCOUNTER — Other Ambulatory Visit: Payer: Self-pay | Admitting: Internal Medicine

## 2021-05-21 ENCOUNTER — Other Ambulatory Visit: Payer: Self-pay

## 2021-05-21 DIAGNOSIS — E1142 Type 2 diabetes mellitus with diabetic polyneuropathy: Secondary | ICD-10-CM

## 2021-05-21 DIAGNOSIS — Z794 Long term (current) use of insulin: Secondary | ICD-10-CM

## 2021-05-21 DIAGNOSIS — I10 Essential (primary) hypertension: Secondary | ICD-10-CM

## 2021-05-21 MED ORDER — METFORMIN HCL 500 MG PO TABS
ORAL_TABLET | Freq: Two times a day (BID) | ORAL | 2 refills | Status: DC
  Filled 2021-05-21: qty 60, 30d supply, fill #0
  Filled 2021-07-16: qty 60, 30d supply, fill #1
  Filled 2021-08-26: qty 60, 30d supply, fill #2

## 2021-05-21 MED ORDER — HYDRALAZINE HCL 50 MG PO TABS
ORAL_TABLET | Freq: Two times a day (BID) | ORAL | 2 refills | Status: DC
  Filled 2021-05-21: qty 60, 30d supply, fill #0
  Filled 2021-07-16: qty 60, 30d supply, fill #1

## 2021-05-21 MED FILL — Gabapentin Cap 300 MG: ORAL | 30 days supply | Qty: 90 | Fill #1 | Status: AC

## 2021-05-21 MED FILL — Atorvastatin Calcium Tab 10 MG (Base Equivalent): ORAL | 30 days supply | Qty: 30 | Fill #1 | Status: AC

## 2021-05-21 MED FILL — Duloxetine HCl Enteric Coated Pellets Cap 20 MG (Base Eq): ORAL | 30 days supply | Qty: 60 | Fill #1 | Status: AC

## 2021-05-21 MED FILL — Losartan Potassium Tab 50 MG: ORAL | 30 days supply | Qty: 30 | Fill #0 | Status: AC

## 2021-05-21 NOTE — Telephone Encounter (Signed)
Pt. Has appointment in September.

## 2021-05-22 ENCOUNTER — Other Ambulatory Visit: Payer: Self-pay

## 2021-07-08 ENCOUNTER — Other Ambulatory Visit: Payer: Self-pay

## 2021-07-16 ENCOUNTER — Encounter: Payer: Self-pay | Admitting: Internal Medicine

## 2021-07-16 ENCOUNTER — Other Ambulatory Visit: Payer: Self-pay

## 2021-07-16 ENCOUNTER — Ambulatory Visit: Payer: Self-pay | Attending: Internal Medicine | Admitting: Internal Medicine

## 2021-07-16 ENCOUNTER — Other Ambulatory Visit: Payer: Self-pay | Admitting: Internal Medicine

## 2021-07-16 VITALS — BP 160/111 | HR 92 | Resp 16 | Wt >= 6400 oz

## 2021-07-16 DIAGNOSIS — F320 Major depressive disorder, single episode, mild: Secondary | ICD-10-CM

## 2021-07-16 DIAGNOSIS — F32A Depression, unspecified: Secondary | ICD-10-CM

## 2021-07-16 DIAGNOSIS — Z794 Long term (current) use of insulin: Secondary | ICD-10-CM

## 2021-07-16 DIAGNOSIS — E1169 Type 2 diabetes mellitus with other specified complication: Secondary | ICD-10-CM

## 2021-07-16 DIAGNOSIS — E1142 Type 2 diabetes mellitus with diabetic polyneuropathy: Secondary | ICD-10-CM

## 2021-07-16 DIAGNOSIS — Z23 Encounter for immunization: Secondary | ICD-10-CM

## 2021-07-16 DIAGNOSIS — F321 Major depressive disorder, single episode, moderate: Secondary | ICD-10-CM

## 2021-07-16 DIAGNOSIS — E785 Hyperlipidemia, unspecified: Secondary | ICD-10-CM

## 2021-07-16 DIAGNOSIS — I10 Essential (primary) hypertension: Secondary | ICD-10-CM

## 2021-07-16 LAB — POCT GLYCOSYLATED HEMOGLOBIN (HGB A1C): HbA1c, POC (controlled diabetic range): 11.7 % — AB (ref 0.0–7.0)

## 2021-07-16 LAB — GLUCOSE, POCT (MANUAL RESULT ENTRY): POC Glucose: 256 mg/dl — AB (ref 70–99)

## 2021-07-16 MED ORDER — LOSARTAN POTASSIUM 50 MG PO TABS
ORAL_TABLET | Freq: Every day | ORAL | 2 refills | Status: DC
  Filled 2021-08-26 – 2022-02-12 (×2): qty 30, 30d supply, fill #0
  Filled 2022-05-03: qty 30, 30d supply, fill #1

## 2021-07-16 MED ORDER — TRUEPLUS LANCETS 28G MISC
4 refills | Status: AC
  Filled 2021-07-16: qty 100, 25d supply, fill #0

## 2021-07-16 MED ORDER — DULOXETINE HCL 20 MG PO CPEP
ORAL_CAPSULE | ORAL | 0 refills | Status: DC
  Filled 2021-07-16: qty 60, 30d supply, fill #0

## 2021-07-16 MED ORDER — ATORVASTATIN CALCIUM 10 MG PO TABS
ORAL_TABLET | Freq: Every day | ORAL | 2 refills | Status: DC
  Filled 2021-08-26: qty 30, 30d supply, fill #0
  Filled 2021-10-27: qty 30, 30d supply, fill #1
  Filled 2022-02-12: qty 30, 30d supply, fill #0
  Filled 2022-05-03: qty 30, 30d supply, fill #1

## 2021-07-16 MED ORDER — TRUE METRIX METER W/DEVICE KIT
PACK | 0 refills | Status: AC
  Filled 2021-07-16: qty 1, 1d supply, fill #0

## 2021-07-16 MED ORDER — HYDRALAZINE HCL 50 MG PO TABS
ORAL_TABLET | Freq: Two times a day (BID) | ORAL | 2 refills | Status: DC
  Filled 2021-08-26: qty 60, 30d supply, fill #0
  Filled 2021-10-27: qty 60, 30d supply, fill #1

## 2021-07-16 MED ORDER — INSULIN NPH (HUMAN) (ISOPHANE) 100 UNIT/ML ~~LOC~~ SUSP
34.0000 [IU] | Freq: Two times a day (BID) | SUBCUTANEOUS | 11 refills | Status: DC
  Filled 2021-07-16: qty 20, 29d supply, fill #0
  Filled 2021-08-26: qty 60, 88d supply, fill #1
  Filled 2021-12-04: qty 40, 59d supply, fill #0

## 2021-07-16 MED ORDER — INSULIN ASPART 100 UNIT/ML FLEXPEN
12.0000 [IU] | PEN_INJECTOR | Freq: Three times a day (TID) | SUBCUTANEOUS | 11 refills | Status: DC
  Filled 2021-07-16: qty 12, 36d supply, fill #0
  Filled 2021-08-26: qty 30, 90d supply, fill #1
  Filled 2021-12-04: qty 30, 90d supply, fill #0
  Filled 2022-02-22: qty 9, 27d supply, fill #1
  Filled 2022-04-29: qty 12, 36d supply, fill #1
  Filled 2022-06-29: qty 15, 41d supply, fill #2

## 2021-07-16 MED ORDER — GABAPENTIN 300 MG PO CAPS
ORAL_CAPSULE | ORAL | 6 refills | Status: DC
  Filled 2021-08-26 – 2022-05-03 (×2): qty 90, 30d supply, fill #0

## 2021-07-16 MED FILL — Atorvastatin Calcium Tab 10 MG (Base Equivalent): ORAL | 30 days supply | Qty: 30 | Fill #2 | Status: AC

## 2021-07-16 MED FILL — Losartan Potassium Tab 50 MG: ORAL | 30 days supply | Qty: 30 | Fill #1 | Status: AC

## 2021-07-16 MED FILL — Gabapentin Cap 300 MG: ORAL | 30 days supply | Qty: 90 | Fill #2 | Status: AC

## 2021-07-16 NOTE — Progress Notes (Signed)
Patient has some question about his diabetes.

## 2021-07-16 NOTE — Progress Notes (Signed)
Patient ID: Bradley Cox, male    DOB: Oct 01, 1971  MRN: [id number]  CC: Follow-up and Diabetes   Subjective: Bradley Cox is a 50 y.o. male who presents for chronic ds management His concerns today include:  Patient with history of DM type II with neuropathy, obesity, HTN,  DVT right leg, asthma. MSSA bacteremia, depression.    Out of all meds x 3 days Checks BP once to twice a mth Readings good while he was on his medications.  He is on hydralazine and Cozaar.  He limits salt in his foods.  No chest pains or shortness of breath.  DIABETES TYPE 2 Last A1C:   Results for orders placed or performed in visit on 07/16/21  POCT glucose (manual entry)  Result Value Ref Range   POC Glucose 256 (A) 70 - 99 mg/dl  POCT glycosylated hemoglobin (Hb A1C)  Result Value Ref Range   Hemoglobin A1C     HbA1c POC (<> result, manual entry)     HbA1c, POC (prediabetic range)     HbA1c, POC (controlled diabetic range) 11.7 (A) 0.0 - 7.0 %    Med Adherence:  '[x]'  Yes  Novolin N 28 units BID, Novolog TID 8 units Medication side effects:  '[]'  Yes    '[]'  No Home Monitoring?  '[]'  Yes    '[x]'  No not in a few wks but prior to that he reports that his blood sugars were continuously high.  He is a bit frustrated about it.  Needs new meter Home glucose results range: Diet Adherence: '[x]'  Yes -does not eat out.  No sugary drinks.  Feels he eats very healthy   '[]'  No Exercise: '[]'  Yes    '[]'  No Hypoglycemic episodes?: '[]'  Yes    '[]'  No Numbness of the feet? '[x]'  Yes    '[]'  No Retinopathy hx? '[]'  Yes    '[x]'  No Last eye exam:  had eye exam 03/2021.  Has new glasses.  No changes from DM Comments:   Depression:  feeling a lot better since we last spoke.  Still on Cymbalta Patient Active Problem List   Diagnosis Date Noted   Lesion of skin of nose 07/01/2020   Primary insomnia 02/29/2020   Obesity (BMI 30-39.9) 10/20/2019   Current moderate episode of major depressive disorder without prior episode ([place]) 10/20/2019    Drug induced constipation    Uncontrolled type 2 diabetes mellitus with hyperglycemia ([place])    Acute blood loss anemia    Postoperative pain    MSSA bacteremia 07/10/2019   Abscess of right hip 07/09/2019   Compression of right sciatic nerve 07/09/2019   Morbid obesity ([place]) 01/21/2009   GERD 01/21/2009   DYSPHAGIA 01/21/2009   Essential hypertension 01/07/2009   ASTHMA 01/07/2009     Current Outpatient Medications on File Prior to Visit  Medication Sig Dispense Refill   acetaminophen (TYLENOL) 325 MG tablet Take 2 tablets (650 mg total) by mouth every 6 (six) hours as needed for mild pain.     albuterol (VENTOLIN HFA) 108 (90 Base) MCG/ACT inhaler Inhale 1 puff into the lungs every 6 (six) hours as needed for wheezing or shortness of breath.      glucose blood (TRUE METRIX BLOOD GLUCOSE TEST) test strip Use as instructed 100 each 12   Insulin Syringe-Needle U-100 (BD INSULIN SYRINGE ULTRAFINE) 31G X 15/64" 1 ML MISC Use as directed 100 each 4   metFORMIN (GLUCOPHAGE) 500 MG tablet TAKE 1 TABLET (500 MG TOTAL)  BY MOUTH 2 (TWO) TIMES DAILY WITH A MEAL. 60 tablet 2   Multiple Vitamin (THERA) TABS Take 2 tablets by mouth daily.     methocarbamol (ROBAXIN) 500 MG tablet TAKE 2 TABLETS (1,000 MG TOTAL) BY MOUTH 4 (FOUR) TIMES DAILY. (Patient not taking: Reported on 07/16/2021) 120 tablet 0   No current facility-administered medications on file prior to visit.    Allergies  Allergen Reactions   Lisinopril Cough   Toradol [Ketorolac Tromethamine]     SOB, sweating. Tolerates Ibuprofen. Wife reports reaction may have been due to injection.    Social History   Socioeconomic History   Marital status: Single    Spouse name: Not on file   Number of children: Not on file   Years of education: Not on file   Highest education level: Not on file  Occupational History   Not on file  Tobacco Use   Smoking status: Never   Smokeless tobacco: Current    Types: Snuff  Substance and Sexual  Activity   Alcohol use: Yes    Comment: rarely   Drug use: Never   Sexual activity: Not on file  Other Topics Concern   Not on file  Social History Narrative   Not on file   Social Determinants of Health   Financial Resource Strain: Not on file  Food Insecurity: Not on file  Transportation Needs: Not on file  Physical Activity: Not on file  Stress: Not on file  Social Connections: Not on file  Intimate Partner Violence: Not on file    Family History  Problem Relation Age of Onset   Diabetes Paternal Grandmother     Past Surgical History:  Procedure Laterality Date   APPLICATION OF WOUND VAC Right 07/17/2019   Procedure: Application Of Wound Vac;  Surgeon: Altamese [place], MD;  Location: [place];  Service: Orthopedics;  Laterality: Right;   APPLICATION OF WOUND VAC Right 07/20/2019   Procedure: Application Of Wound Vac;  Surgeon: Altamese Travilah, MD;  Location: [place];  Service: Orthopedics;  Laterality: Right;   APPLICATION OF WOUND VAC Right 07/27/2019   Procedure: Application Of Wound Vac;  Surgeon: Shona Needles, MD;  Location: [place];  Service: Orthopedics;  Laterality: Right;   HERNIA REPAIR     As a baby   I & D EXTREMITY Right 07/27/2019   Procedure: IRRIGATION AND DEBRIDEMENT EXTREMITY Right Leg;  Surgeon: Shona Needles, MD;  Location: [place];  Service: Orthopedics;  Laterality: Right;   I & D EXTREMITY Right 07/30/2019   Procedure: IRRIGATION AND DEBRIDEMENT EXTREMITY WITH WOUND VAC CHANGE;  Surgeon: Shona Needles, MD;  Location: [place];  Service: Orthopedics;  Laterality: Right;  IRRIGATION AND DEBRIDEMENT EXTREMITY WITH WOUND VAC CHANGE   I & D EXTREMITY Right 08/08/2019   Procedure: IRRIGATION AND DEBRIDEMENT RIGHT EXTREMITY;  Surgeon: Shona Needles, MD;  Location: [place];  Service: Orthopedics;  Laterality: Right;   INCISION AND DRAINAGE ABSCESS Right 07/09/2019   Procedure: INCISION AND DRAINAGE ABSCESS RIGHT HIP/PELVIS;  Surgeon: Shona Needles, MD;  Location: [place];   Service: Orthopedics;  Laterality: Right;   INCISION AND DRAINAGE HIP Right 07/17/2019   Procedure: IRRIGATION AND DEBRIDEMENT HIP;  Surgeon: Altamese Bolivar, MD;  Location: [place];  Service: Orthopedics;  Laterality: Right;   INCISION AND DRAINAGE HIP Right 07/20/2019   Procedure: Irrigation And Debridement Hip;  Surgeon: Altamese [place], MD;  Location: [place];  Service: Orthopedics;  Laterality: Right;  TEE WITHOUT CARDIOVERSION N/A 07/11/2019   Procedure: TRANSESOPHAGEAL ECHOCARDIOGRAM (TEE);  Surgeon: Sanda Klein, MD;  Location: [facility];  Service: Cardiovascular;  Laterality: N/A;    ROS: Review of Systems Negative except as stated above  PHYSICAL EXAM: BP (!) 160/111   Pulse 92   Resp 16   Wt (!) 408 lb (185.1 kg)   SpO2 94%   BMI 47.15 kg/m   Physical Exam   General appearance - alert, well appearing, obese middle-age Caucasian male and in no distress Mental status - normal mood, behavior, speech, dress, motor activity, and thought processes Neck - supple, no significant adenopathy Chest - clear to auscultation, no wheezes, rales or rhonchi, symmetric air entry Heart - normal rate, regular rhythm, normal S1, S2, no murmurs, rubs, clicks or gallops Extremities -mild edema in the right lower extremity. Diabetic Foot Exam - Simple   Simple Foot Form Visual Inspection No deformities, no ulcerations, no other skin breakdown bilaterally: Yes Sensation Testing See comments: Yes Pulse Check Posterior Tibialis and Dorsalis pulse intact bilaterally: Yes Comments Some decrease sensation on both the plantar and dorsal surface of the feet on leap exam.     Depression screen [facility] 2/9 07/16/2021 09/30/2020 07/01/2020  Decreased Interest 1 0 2  Down, Depressed, Hopeless '1 3 1  ' PHQ - 2 Score '2 3 3  ' Altered sleeping '3 1 3  ' Tired, decreased energy '2 2 3  ' Change in appetite '2 1 1  ' Feeling bad or failure about yourself  '2 1 1  ' Trouble concentrating '1 1 1  ' Moving slowly or  fidgety/restless 0 0 0  Suicidal thoughts 0 0 0  PHQ-9 Score '12 9 12    ' CMP Latest Ref Rng & Units 07/01/2020 08/27/2019 08/23/2019  Glucose 65 - 99 mg/dL 256(H) 146(H) 120(H)  BUN 6 - 24 mg/dL 12 <5(L) 5(L)  Creatinine 0.76 - 1.27 mg/dL 0.73(L) 0.57(L) 0.44(L)  Sodium 134 - 144 mmol/L 138 137 135  Potassium 3.5 - 5.2 mmol/L 4.3 3.7 3.8  Chloride 96 - 106 mmol/L 98 100 98  CO2 20 - 29 mmol/L '26 28 27  ' Calcium 8.7 - 10.2 mg/dL 10.2 8.7(L) 8.6(L)  Total Protein 6.0 - 8.5 g/dL 7.8 6.2(L) 6.2(L)  Total Bilirubin 0.0 - 1.2 mg/dL 0.5 <0.1(L) 0.2(L)  Alkaline Phos 48 - 121 IU/L 118 210(H) 54  AST 0 - 40 IU/L 18 31 13(L)  ALT 0 - 44 IU/L '23 31 9   ' Lipid Panel     Component Value Date/Time   CHOL 239 (H) 07/01/2020 1633   TRIG 305 (H) 07/01/2020 1633   HDL 40 07/01/2020 1633   CHOLHDL 6.0 (H) 07/01/2020 1633   CHOLHDL 6.0 07/28/2019 0553   VLDL 26 07/28/2019 0553   LDLCALC 143 (H) 07/01/2020 1633    CBC    Component Value Date/Time   WBC 7.1 07/01/2020 1633   WBC 4.0 08/27/2019 0344   RBC 5.55 07/01/2020 1633   RBC 2.75 (L) 08/27/2019 0344   HGB 16.6 07/01/2020 1633   HCT 49.4 07/01/2020 1633   PLT 171 07/01/2020 1633   MCV 89 07/01/2020 1633   MCH 29.9 07/01/2020 1633   MCH 27.3 08/27/2019 0344   MCHC 33.6 07/01/2020 1633   MCHC 29.1 (L) 08/27/2019 0344   RDW 13.0 07/01/2020 1633   LYMPHSABS 1.5 08/27/2019 0344   MONOABS 0.5 08/27/2019 0344   EOSABS 0.3 08/27/2019 0344   BASOSABS 0.0 08/27/2019 0344    ASSESSMENT AND PLAN: 1. Type 2  diabetes mellitus with diabetic polyneuropathy, with long-term current use of insulin (HCC) Not at goal. Increased NPH insulin to 34 units twice a day.  Increase NovoLog insulin to 12 units with meals. Encouraged him to continue healthy eating habits.  Advised to apply for the orange card/cone discount.  Once approved we can refer him to a nutritionist. Diabetic testing supplies prescription sent. Follow-up with clinical pharmacist in 1  month with blood sugar log. - POCT glucose (manual entry) - POCT glycosylated hemoglobin (Hb A1C) - CBC - Comprehensive metabolic panel - Lipid panel - Blood Glucose Monitoring Suppl (TRUE METRIX METER) w/Device KIT; Use as directed  Dispense: 1 kit; Refill: 0 - TRUEplus Lancets 28G MISC; Use as directed  Dispense: 100 each; Refill: 4 - Microalbumin / creatinine urine ratio - gabapentin (NEURONTIN) 300 MG capsule; TAKE 1 CAPSULE BY MOUTH IN THE MORNING, AND 2 CAPSULES IN THE EVENING  Dispense: 90 capsule; Refill: 6 - insulin NPH Human (NOVOLIN N) 100 UNIT/ML injection; Inject 0.34 mLs (34 Units total) into the skin 2 (two) times daily at 8 am and 10 pm.  Dispense: 10 mL; Refill: 11 - insulin aspart (NOVOLOG) 100 UNIT/ML FlexPen; Inject 12 Units into the skin 3 (three) times daily with meals.  Dispense: 15 mL; Refill: 11  2. Essential hypertension Not at goal but he has been out of his medications for 3 days.  Refills sent. - hydrALAZINE (APRESOLINE) 50 MG tablet; TAKE 1 TABLET (50 MG TOTAL) BY MOUTH 2 (TWO) TIMES DAILY.  Dispense: 180 tablet; Refill: 2 - losartan (COZAAR) 50 MG tablet; TAKE 1 TABLET (50 MG TOTAL) BY MOUTH DAILY.  Dispense: 90 tablet; Refill: 2  3. Morbid obesity ([place]) See #1 above.  4. Mild depression ([place]) Reports doing well on Cymbalta. - DULoxetine (CYMBALTA) 20 MG capsule; TAKE 2 CAPSULES (40 MG TOTAL) BY MOUTH AT BEDTIME.  Dispense: 180 capsule; Refill: 0  5. Hyperlipidemia associated with type 2 diabetes mellitus (HCC) - atorvastatin (LIPITOR) 10 MG tablet; TAKE 1 TABLET (10 MG TOTAL) BY MOUTH DAILY.  Dispense: 90 tablet; Refill: 2  6. Need for influenza vaccination Given today.     Patient was given the opportunity to ask questions.  Patient verbalized understanding of the plan and was able to repeat key elements of the plan.   Orders Placed This Encounter  Procedures   CBC   Comprehensive metabolic panel   Lipid panel   Microalbumin / creatinine urine  ratio   POCT glucose (manual entry)   POCT glycosylated hemoglobin (Hb A1C)     Requested Prescriptions   Signed Prescriptions Disp Refills   Blood Glucose Monitoring Suppl (TRUE METRIX METER) w/Device KIT 1 kit 0    Sig: Use as directed   TRUEplus Lancets 28G MISC 100 each 4    Sig: Use as directed   DULoxetine (CYMBALTA) 20 MG capsule 180 capsule 0    Sig: TAKE 2 CAPSULES (40 MG TOTAL) BY MOUTH AT BEDTIME.   gabapentin (NEURONTIN) 300 MG capsule 90 capsule 6    Sig: TAKE 1 CAPSULE BY MOUTH IN THE MORNING, AND 2 CAPSULES IN THE EVENING   hydrALAZINE (APRESOLINE) 50 MG tablet 180 tablet 2    Sig: TAKE 1 TABLET (50 MG TOTAL) BY MOUTH 2 (TWO) TIMES DAILY.   insulin NPH Human (NOVOLIN N) 100 UNIT/ML injection 10 mL 11    Sig: Inject 0.34 mLs (34 Units total) into the skin 2 (two) times daily at 8 am and 10 pm.  insulin aspart (NOVOLOG) 100 UNIT/ML FlexPen 15 mL 11    Sig: Inject 12 Units into the skin 3 (three) times daily with meals.   atorvastatin (LIPITOR) 10 MG tablet 90 tablet 2    Sig: TAKE 1 TABLET (10 MG TOTAL) BY MOUTH DAILY.   losartan (COZAAR) 50 MG tablet 90 tablet 2    Sig: TAKE 1 TABLET (50 MG TOTAL) BY MOUTH DAILY.    Return in about 4 months (around 11/15/2021) for Give appt with Lurena Joiner in 1 mth for recheck BS.  Karle Plumber, MD, FACP

## 2021-07-17 LAB — LIPID PANEL
Chol/HDL Ratio: 5.6 ratio — ABNORMAL HIGH (ref 0.0–5.0)
Cholesterol, Total: 212 mg/dL — ABNORMAL HIGH (ref 100–199)
HDL: 38 mg/dL — ABNORMAL LOW (ref >39)
LDL Chol Calc (NIH): 109 mg/dL — ABNORMAL HIGH (ref 0–99)
Triglycerides: 376 mg/dL — ABNORMAL HIGH (ref 0–149)
VLDL Cholesterol Cal: 65 mg/dL — ABNORMAL HIGH (ref 5–40)

## 2021-07-17 LAB — CBC
Hematocrit: 47.8 % (ref 37.5–51.0)
Hemoglobin: 16.3 g/dL (ref 13.0–17.7)
MCH: 30.4 pg (ref 26.6–33.0)
MCHC: 34.1 g/dL (ref 31.5–35.7)
MCV: 89 fL (ref 79–97)
Platelets: 180 10*3/uL (ref 150–450)
RBC: 5.36 x10E6/uL (ref 4.14–5.80)
RDW: 12.7 % (ref 11.6–15.4)
WBC: 6.6 10*3/uL (ref 3.4–10.8)

## 2021-07-17 LAB — COMPREHENSIVE METABOLIC PANEL
ALT: 50 IU/L — ABNORMAL HIGH (ref 0–44)
AST: 35 IU/L (ref 0–40)
Albumin/Globulin Ratio: 1.5 (ref 1.2–2.2)
Albumin: 4.4 g/dL (ref 3.8–4.9)
Alkaline Phosphatase: 100 IU/L (ref 44–121)
BUN/Creatinine Ratio: 15 (ref 9–20)
BUN: 12 mg/dL (ref 6–24)
Bilirubin Total: 0.4 mg/dL (ref 0.0–1.2)
CO2: 26 mmol/L (ref 20–29)
Calcium: 9.7 mg/dL (ref 8.7–10.2)
Chloride: 97 mmol/L (ref 96–106)
Creatinine, Ser: 0.81 mg/dL (ref 0.76–1.27)
Globulin, Total: 3 g/dL (ref 1.5–4.5)
Glucose: 230 mg/dL — ABNORMAL HIGH (ref 65–99)
Potassium: 4.1 mmol/L (ref 3.5–5.2)
Sodium: 139 mmol/L (ref 134–144)
Total Protein: 7.4 g/dL (ref 6.0–8.5)
eGFR: 106 mL/min/{1.73_m2} (ref >59)

## 2021-07-17 LAB — MICROALBUMIN / CREATININE URINE RATIO
Creatinine, Urine: 186.6 mg/dL
Microalb/Creat Ratio: 11 mg/g creat (ref 0–29)
Microalbumin, Urine: 19.9 ug/mL

## 2021-07-18 ENCOUNTER — Other Ambulatory Visit: Payer: Self-pay | Admitting: Internal Medicine

## 2021-07-18 DIAGNOSIS — R7989 Other specified abnormal findings of blood chemistry: Secondary | ICD-10-CM

## 2021-07-18 DIAGNOSIS — R945 Abnormal results of liver function studies: Secondary | ICD-10-CM

## 2021-07-29 ENCOUNTER — Other Ambulatory Visit: Payer: Self-pay

## 2021-08-20 ENCOUNTER — Ambulatory Visit: Payer: Self-pay | Admitting: Pharmacist

## 2021-08-25 NOTE — Progress Notes (Signed)
   S:    PCP: Dr. Wynetta Emery  Patient presents for diabetes evaluation, education, and management. Patient was referred and last seen by Primary Care Provider on 07/16/21. A1c was elevated and both NPH and Novolog insulins were increased.   Today, patient reports he has not been checking his BG due to running out of test strips >2 weeks ago. He reports adherence to insulin but has been out of metformin for a couple days. He has not yet been able to apply for the orange card.   Family/Social History:  -DM in grandmother -Never smoker  Human resources officer affordability: None  Medication adherence: ran out of metformin 2 days ago.   Current diabetes medications include: NPH insulin 34 units BID, Novolog 12 units TIDWC, metformin 500 mg BID Current hypertension medications include: hydralazine 50 mg BID, losartan 50 mg daily Current hyperlipidemia medications include: atorvastatin 10 mg daily  Patient denies hypoglycemic events.  Patient reported dietary habits: Eats 3 meals/day Ate a sausage croissant at 6:30am, free hot dog at 10:30am, 2 starbursts 1 hour ago  Patient-reported exercise habits: not addressed   O:  POCT BG: 214   Lab Results  Component Value Date   HGBA1C 11.7 (A) 07/16/2021   There were no vitals filed for this visit.  Lipid Panel     Component Value Date/Time   CHOL 212 (H) 07/16/2021 1638   TRIG 376 (H) 07/16/2021 1638   HDL 38 (L) 07/16/2021 1638   CHOLHDL 5.6 (H) 07/16/2021 1638   CHOLHDL 6.0 07/28/2019 0553   VLDL 26 07/28/2019 0553   LDLCALC 109 (H) 07/16/2021 1638    Home fasting blood sugars: not checking  2 hour post-meal/random blood sugars: not checking  Clinical Atherosclerotic Cardiovascular Disease (ASCVD): No  The 10-year ASCVD risk score (Arnett DK, et al., 2019) is: 18.7%   Values used to calculate the score:     Age: 50 years     Sex: Male     Is Non-Hispanic African American: No     Diabetic: Yes     Tobacco smoker: No      Systolic Blood Pressure: 376 mmHg     Is BP treated: Yes     HDL Cholesterol: 38 mg/dL     Total Cholesterol: 212 mg/dL    A/P:  Diabetes longstanding currently uncontrolled with most recent A1c 11.7 (07/16/21) up from 9.6 a year ago. Patient is able to verbalize appropriate hypoglycemia management plan. Medication adherence appears okay at best. Control is suboptimal due to occasional nonadherence and requiring optimization of regimen.  -Increase metformin to 1000 mg BID and switch to XR formulation for better tolerability -Continue current insulin regimen -Refilled test strips and instructed him to start checking BG and bring meter to next visit with PCP -Extensively discussed pathophysiology of diabetes, recommended lifestyle interventions, dietary effects on blood sugar control -Counseled on s/sx of and management of hypoglycemia -Next A1C anticipated December. Could consider starting a GLP1 agonist (can apply for patient assistance) in the future to decrease insulin regimen.   Total time in face to face counseling 20 minutes. Pharmacist clinic will not see this patient back. Upon stepping into the room patient was aggressive and hyperbolizing his clinical situation. Has made multiple inappropriate comments both today and at past CPP visits. Follow up with PCP as scheduled.   Rebbeca Paul, PharmD PGY2 Ambulatory Care Pharmacy Resident 08/26/2021 4:30 PM

## 2021-08-26 ENCOUNTER — Encounter: Payer: Self-pay | Admitting: Pharmacist

## 2021-08-26 ENCOUNTER — Other Ambulatory Visit: Payer: Self-pay

## 2021-08-26 ENCOUNTER — Ambulatory Visit: Payer: Self-pay | Attending: Internal Medicine | Admitting: Pharmacist

## 2021-08-26 ENCOUNTER — Other Ambulatory Visit: Payer: Self-pay | Admitting: Internal Medicine

## 2021-08-26 DIAGNOSIS — Z794 Long term (current) use of insulin: Secondary | ICD-10-CM

## 2021-08-26 DIAGNOSIS — E119 Type 2 diabetes mellitus without complications: Secondary | ICD-10-CM

## 2021-08-26 LAB — GLUCOSE, POCT (MANUAL RESULT ENTRY): POC Glucose: 214 mg/dl — AB (ref 70–99)

## 2021-08-26 MED ORDER — TRUE METRIX BLOOD GLUCOSE TEST VI STRP
ORAL_STRIP | 12 refills | Status: DC
  Filled 2021-08-26 – 2022-05-03 (×2): qty 100, 25d supply, fill #0

## 2021-08-26 MED ORDER — METFORMIN HCL ER 500 MG PO TB24
1000.0000 mg | ORAL_TABLET | Freq: Two times a day (BID) | ORAL | 2 refills | Status: DC
  Filled 2021-08-26: qty 120, 30d supply, fill #0
  Filled 2021-10-27: qty 120, 30d supply, fill #1
  Filled 2021-12-04: qty 120, 30d supply, fill #0

## 2021-08-26 NOTE — Telephone Encounter (Signed)
Requested medication (s) are due for refill today: Yes  Requested medication (s) are on the active medication list: Yes  Last refill:  10/19/19  Future visit scheduled: Yes  Notes to clinic:  Prescription expired.    Requested Prescriptions  Pending Prescriptions Disp Refills   glucose blood (TRUE METRIX BLOOD GLUCOSE TEST) test strip 100 each 12    Sig: Use as instructed     Endocrinology: Diabetes - Testing Supplies Passed - 08/26/2021  9:07 AM      Passed - Valid encounter within last 12 months    Recent Outpatient Visits           1 month ago Type 2 diabetes mellitus with diabetic polyneuropathy, with long-term current use of insulin ([place])   [place], Neoma Laming B, MD   11 months ago Type 2 diabetes mellitus with diabetic polyneuropathy, with long-term current use of insulin ([place])   [place], Deborah B, MD   1 year ago Essential hypertension   [place], Jarome Matin, RPH-CPP   1 year ago Essential hypertension   [place], Jarome Matin, RPH-CPP   1 year ago Need for influenza vaccination   [place], Jarome Matin, RPH-CPP       Future Appointments             Today Daisy Blossom, Jarome Matin, [place]   In 2 months Wynetta Emery, Dalbert Batman, MD [place]

## 2021-08-27 ENCOUNTER — Other Ambulatory Visit: Payer: Self-pay

## 2021-09-11 ENCOUNTER — Other Ambulatory Visit: Payer: Self-pay

## 2021-10-27 ENCOUNTER — Other Ambulatory Visit: Payer: Self-pay

## 2021-11-19 ENCOUNTER — Ambulatory Visit: Payer: Self-pay | Attending: Internal Medicine | Admitting: Internal Medicine

## 2021-11-19 ENCOUNTER — Encounter: Payer: Self-pay | Admitting: Internal Medicine

## 2021-11-19 VITALS — BP 159/97 | HR 83 | Resp 16

## 2021-11-19 DIAGNOSIS — E782 Mixed hyperlipidemia: Secondary | ICD-10-CM

## 2021-11-19 DIAGNOSIS — E1142 Type 2 diabetes mellitus with diabetic polyneuropathy: Secondary | ICD-10-CM

## 2021-11-19 DIAGNOSIS — I10 Essential (primary) hypertension: Secondary | ICD-10-CM

## 2021-11-19 DIAGNOSIS — Z23 Encounter for immunization: Secondary | ICD-10-CM

## 2021-11-19 DIAGNOSIS — S80212A Abrasion, left knee, initial encounter: Secondary | ICD-10-CM

## 2021-11-19 LAB — POCT GLYCOSYLATED HEMOGLOBIN (HGB A1C): HbA1c, POC (controlled diabetic range): 9 % — AB (ref 0.0–7.0)

## 2021-11-19 LAB — GLUCOSE, POCT (MANUAL RESULT ENTRY): POC Glucose: 89 mg/dl (ref 70–99)

## 2021-11-19 MED ORDER — OZEMPIC (0.25 OR 0.5 MG/DOSE) 2 MG/1.5ML ~~LOC~~ SOPN
0.2500 mg | PEN_INJECTOR | SUBCUTANEOUS | 2 refills | Status: DC
  Filled 2021-11-19: qty 1.5, 56d supply, fill #0

## 2021-11-19 MED ORDER — OZEMPIC (0.25 OR 0.5 MG/DOSE) 2 MG/1.5ML ~~LOC~~ SOPN
0.2500 mg | PEN_INJECTOR | SUBCUTANEOUS | 4 refills | Status: DC
  Filled 2021-11-19: qty 1.5, 56d supply, fill #0
  Filled 2021-12-04: qty 1.5, 42d supply, fill #0

## 2021-11-19 MED ORDER — HYDRALAZINE HCL 50 MG PO TABS
50.0000 mg | ORAL_TABLET | Freq: Three times a day (TID) | ORAL | 2 refills | Status: DC
Start: 2021-11-19 — End: 2022-11-26
  Filled 2021-11-19 – 2021-12-04 (×2): qty 90, 30d supply, fill #0
  Filled 2022-01-19: qty 90, 30d supply, fill #1
  Filled 2022-02-12: qty 90, 30d supply, fill #2
  Filled 2022-05-03: qty 90, 30d supply, fill #3
  Filled 2022-06-29: qty 90, 30d supply, fill #4
  Filled 2022-07-24: qty 90, 30d supply, fill #5
  Filled 2022-11-06: qty 90, 30d supply, fill #6

## 2021-11-19 NOTE — Progress Notes (Signed)
Patient ID: Bradley Cox, male    DOB: [birth year]  MRN: [id number]  CC: chronic ds management  Subjective: Bradley Cox is a 51 y.o. male who presents for  chronic ds management His concerns today include:  Patient with history of DM type II with neuropathy, morbid obesity, HTN,  HL, DVT right leg, asthma. MSSA bacteremia, depression.     DM:   BS 89 Lab Results  Component Value Date   HGBA1C 9.0 (A) 11/19/2021  Reports compliance with taking Novolog 12 TID, NPH 35 BID, Metformin XR 1000 mg twice a day.  He admits that he has not been checking blood sugars as much.  Reports that he is eating better.  He cooks for his father who has heart disease.  HTN: Reports compliance with taking hydralazine and Cozaar.  He has taken morning dose of medicines already for today.  He tries to limit salt in the foods.  He has not checked blood pressure at home in a while.  Blood pressure elevated today.  He attributes this to having fallen on a concrete step in his house just before coming out.  Landed on his LT knee scraping it really bad.  HL:  taking and tolerating Lipitor Patient Active Problem List   Diagnosis Date Noted   Lesion of skin of nose 07/01/2020   Primary insomnia 02/29/2020   Obesity (BMI 30-39.9) 10/20/2019   Current moderate episode of major depressive disorder without prior episode ([place]) 10/20/2019   Drug induced constipation    Uncontrolled type 2 diabetes mellitus with hyperglycemia ([place])    Acute blood loss anemia    Postoperative pain    MSSA bacteremia 07/10/2019   Abscess of right hip 07/09/2019   Compression of right sciatic nerve 07/09/2019   Morbid obesity ([place]) 01/21/2009   GERD 01/21/2009   DYSPHAGIA 01/21/2009   Essential hypertension 01/07/2009   ASTHMA 01/07/2009     Current Outpatient Medications on File Prior to Visit  Medication Sig Dispense Refill   acetaminophen (TYLENOL) 325 MG tablet Take 2 tablets (650 mg total) by mouth every 6 (six) hours as  needed for mild pain.     albuterol (VENTOLIN HFA) 108 (90 Base) MCG/ACT inhaler Inhale 1 puff into the lungs every 6 (six) hours as needed for wheezing or shortness of breath.      atorvastatin (LIPITOR) 10 MG tablet TAKE 1 TABLET (10 MG TOTAL) BY MOUTH DAILY. 90 tablet 2   Blood Glucose Monitoring Suppl (TRUE METRIX METER) w/Device KIT Use as directed 1 kit 0   DULoxetine (CYMBALTA) 20 MG capsule TAKE 2 CAPSULES (40 MG TOTAL) BY MOUTH AT BEDTIME. 180 capsule 0   gabapentin (NEURONTIN) 300 MG capsule TAKE 1 CAPSULE BY MOUTH IN THE MORNING AND 2 CAPSULES IN THE EVENING 90 capsule 6   glucose blood (TRUE METRIX BLOOD GLUCOSE TEST) test strip Use as instructed 100 each 12   insulin aspart (NOVOLOG) 100 UNIT/ML FlexPen Inject 12 Units into the skin 3 (three) times daily with meals. 15 mL 11   insulin NPH Human (NOVOLIN N) 100 UNIT/ML injection Inject 0.34 mLs (34 Units total) into the skin 2 (two) times daily at 8 am and 10 pm. 10 mL 11   Insulin Syringe-Needle U-100 (BD INSULIN SYRINGE ULTRAFINE) 31G X 15/64" 1 ML MISC Use as directed 100 each 4   losartan (COZAAR) 50 MG tablet TAKE 1 TABLET (50 MG TOTAL) BY MOUTH DAILY. 90 tablet 2   metFORMIN (GLUCOPHAGE XR)  500 MG 24 hr tablet Take 2 tablets (1,000 mg total) by mouth 2 (two) times daily. 120 tablet 2   Multiple Vitamin (THERA) TABS Take 2 tablets by mouth daily.     TRUEplus Lancets 28G MISC Use as directed 100 each 4   No current facility-administered medications on file prior to visit.    Allergies  Allergen Reactions   Lisinopril Cough   Toradol [Ketorolac Tromethamine]     SOB, sweating. Tolerates Ibuprofen. Wife reports reaction may have been due to injection.    Social History   Socioeconomic History   Marital status: Single    Spouse name: Not on file   Number of children: Not on file   Years of education: Not on file   Highest education level: Not on file  Occupational History   Not on file  Tobacco Use   Smoking status:  Never   Smokeless tobacco: Current    Types: Snuff  Substance and Sexual Activity   Alcohol use: Yes    Comment: rarely   Drug use: Never   Sexual activity: Not on file  Other Topics Concern   Not on file  Social History Narrative   Not on file   Social Determinants of Health   Financial Resource Strain: Not on file  Food Insecurity: Not on file  Transportation Needs: Not on file  Physical Activity: Not on file  Stress: Not on file  Social Connections: Not on file  Intimate Partner Violence: Not on file    Family History  Problem Relation Age of Onset   Diabetes Paternal Grandmother     Past Surgical History:  Procedure Laterality Date   APPLICATION OF WOUND VAC Right 07/17/2019   Procedure: Application Of Wound Vac;  Surgeon: Altamese Kaskaskia, MD;  Location: [facility];  Service: Orthopedics;  Laterality: Right;   APPLICATION OF WOUND VAC Right 07/20/2019   Procedure: Application Of Wound Vac;  Surgeon: Altamese Brandon, MD;  Location: [facility];  Service: Orthopedics;  Laterality: Right;   APPLICATION OF WOUND VAC Right 07/27/2019   Procedure: Application Of Wound Vac;  Surgeon: Shona Needles, MD;  Location: [facility];  Service: Orthopedics;  Laterality: Right;   HERNIA REPAIR     As a baby   I & D EXTREMITY Right 07/27/2019   Procedure: IRRIGATION AND DEBRIDEMENT EXTREMITY Right Leg;  Surgeon: Shona Needles, MD;  Location: [facility];  Service: Orthopedics;  Laterality: Right;   I & D EXTREMITY Right 07/30/2019   Procedure: IRRIGATION AND DEBRIDEMENT EXTREMITY WITH WOUND VAC CHANGE;  Surgeon: Shona Needles, MD;  Location: [facility];  Service: Orthopedics;  Laterality: Right;  IRRIGATION AND DEBRIDEMENT EXTREMITY WITH WOUND VAC CHANGE   I & D EXTREMITY Right 08/08/2019   Procedure: IRRIGATION AND DEBRIDEMENT RIGHT EXTREMITY;  Surgeon: Shona Needles, MD;  Location: [facility];  Service: Orthopedics;  Laterality: Right;   INCISION AND DRAINAGE ABSCESS Right 07/09/2019   Procedure: INCISION AND  DRAINAGE ABSCESS RIGHT HIP/PELVIS;  Surgeon: Shona Needles, MD;  Location: [facility];  Service: Orthopedics;  Laterality: Right;   INCISION AND DRAINAGE HIP Right 07/17/2019   Procedure: IRRIGATION AND DEBRIDEMENT HIP;  Surgeon: Altamese Rincon, MD;  Location: [facility];  Service: Orthopedics;  Laterality: Right;   INCISION AND DRAINAGE HIP Right 07/20/2019   Procedure: Irrigation And Debridement Hip;  Surgeon: Altamese Calumet, MD;  Location: [place] [facility];  Service: Orthopedics;  Laterality: Right;   TEE WITHOUT CARDIOVERSION N/A 07/11/2019   Procedure:  ECHOCARDIOGRAM (TEE);  Surgeon: Croitoru, Mihai, MD;  [facility]: [facility];  Service: Cardiovascular;  Laterality: N/A;  ° ° °ROS: °Review of Systems °Negative except as stated above ° °PHYSICAL EXAM: °BP (!) 159/97    Pulse 83    Resp 16    SpO2 95%   ° °Physical Exam °General appearance - alert, well appearing, morbidly obese middle-age Caucasian male and in no distress °Mental status - normal mood, behavior, speech, dress, motor activity, and thought processes °Neck - supple, no significant adenopathy °Chest - clear to auscultation, no wheezes, rales or rhonchi, symmetric air entry °Heart - normal rate, regular rhythm, normal S1, S2, no murmurs, rubs, clicks or gallops °Extremities - peripheral pulses normal, no pedal edema, no clubbing or cyanosis °Skin - LT knee -oozing abrasion about 3cm across middle of LT knee cap.  No surrounding erythema °Diabetic Foot Exam - Simple   °Simple Foot Form °Visual Inspection °See comments: Yes °Sensation Testing °See comments: Yes °Pulse Check °Posterior Tibialis and Dorsalis pulse intact bilaterally: Yes °Comments °Flat footed.  Decrease sensation on soles on LEAP exam °  ° ° °CMP Latest Ref Rng & Units 07/16/2021 07/01/2020 08/27/2019  °Glucose 65 - 99 mg/dL 230(H) 256(H) 146(H)  °BUN 6 - 24 mg/dL 12 12 <5(L)  °Creatinine 0.76 - 1.27 mg/dL 0.81 0.73(L) 0.57(L)  °Sodium 134 - 144 mmol/L 139 138 137  °Potassium 3.5 - 5.2  mmol/L 4.1 4.3 3.7  °Chloride 96 - 106 mmol/L 97 98 100  °CO2 20 - 29 mmol/L 26 26 28  °Calcium 8.7 - 10.2 mg/dL 9.7 10.2 8.7(L)  °Total Protein 6.0 - 8.5 g/dL 7.4 7.8 6.2(L)  °Total Bilirubin 0.0 - 1.2 mg/dL 0.4 0.5 <0.1(L)  °Alkaline Phos 44 - 121 IU/L 100 118 210(H)  °AST 0 - 40 IU/L 35 18 31  °ALT 0 - 44 IU/L 50(H) 23 31  ° °Lipid Panel  °   °Component Value Date/Time  ° CHOL 212 (H) 07/16/2021 1638  ° TRIG 376 (H) 07/16/2021 1638  ° HDL 38 (L) 07/16/2021 1638  ° CHOLHDL 5.6 (H) 07/16/2021 1638  ° CHOLHDL 6.0 07/28/2019 0553  ° VLDL 26 07/28/2019 0553  ° LDLCALC 109 (H) 07/16/2021 1638  ° ° °CBC °   °Component Value Date/Time  ° WBC 6.6 07/16/2021 1638  ° WBC 4.0 08/27/2019 0344  ° RBC 5.36 07/16/2021 1638  ° RBC 2.75 (L) 08/27/2019 0344  ° HGB 16.3 07/16/2021 1638  ° HCT 47.8 07/16/2021 1638  ° PLT 180 07/16/2021 1638  ° MCV 89 07/16/2021 1638  ° MCH 30.4 07/16/2021 1638  ° MCH 27.3 08/27/2019 0344  ° MCHC 34.1 07/16/2021 1638  ° MCHC 29.1 (L) 08/27/2019 0344  ° RDW 12.7 07/16/2021 1638  ° LYMPHSABS 1.5 08/27/2019 0344  ° MONOABS 0.5 08/27/2019 0344  ° EOSABS 0.3 08/27/2019 0344  ° BASOSABS 0.0 08/27/2019 0344  ° ° °ASSESSMENT AND PLAN: °1. Type 2 diabetes mellitus with peripheral neuropathy (HCC) °Not at goal. °Continue metformin, Novolog and Novolin insulins °We discussed adding Ozempic for the benefit of better controlling blood sugars and also to assist with weight loss.  I went over with the patient how the medication works.  We also discussed possible side effects including vomiting, epigastric abdominal pain associated with pancreatitis.  He does not have any family history of medullary thyroid cancer.  He has no history of thyroid nodules.  Advised patient that he may experience some nausea for the first few weeks.  However if   if he develops any vomiting or epigastric abdominal pain he should stop the medicine and let me know.  He is agreeable to trying the medication.  He will return next week to see our  clinical pharmacist for teaching on use of Ozempic. -Once he starts the Ozempic, I recommend that he checks his blood sugars once to twice a day before meals and bring in the readings in 1 month again to see the clinical pharmacist for further titration of the medication.  We may be able to decrease the dose of his short acting insulin now that we are adding Ozempic.  We will have to see how his blood sugars respond. - POCT glucose (manual entry) - POCT glycosylated hemoglobin (Hb A1C) - Semaglutide,0.25 or 0.5MG/DOS, (OZEMPIC, 0.25 OR 0.5 MG/DOSE,) 2 MG/1.5ML SOPN; Inject 0.25 mg into the skin once a week.  Dispense: 1.5 mL; Refill: 4  2. Essential hypertension Not at goal.  Increase hydralazine from 50 mg twice a day to 50 mg 3 times a day.   - hydrALAZINE (APRESOLINE) 50 MG tablet; Take 1 tablet (50 mg total) by mouth 3 (three) times daily.  Dispense: 270 tablet; Refill: 2  3. Mixed hyperlipidemia Continue atorvastatin.  4. Abrasion of left knee, initial encounter This was cleaned up by my CMA and a clean gauze applied.  Patient given some triple antibiotic ointment, sterile water and some gauze to take home with him.  Advised to do dressing changes once a day by cleaning the area with sterile water, dabbing it dry and applying some triple antibiotic ointment and covering.  5. Need for Tdap vaccination Tdap vaccine given today by CMA.     Patient was given the opportunity to ask questions.  Patient verbalized understanding of the plan and was able to repeat key elements of the plan.   Orders Placed This Encounter  Procedures   POCT glucose (manual entry)   POCT glycosylated hemoglobin (Hb A1C)     Requested Prescriptions   Signed Prescriptions Disp Refills   hydrALAZINE (APRESOLINE) 50 MG tablet 270 tablet 2    Sig: Take 1 tablet (50 mg total) by mouth 3 (three) times daily.    Return in about 4 months (around 03/19/2022) for Give appt with Lurena Joiner next wk for [employer],  then again in 1 mth for DM.  Karle Plumber, MD, FACP

## 2021-11-20 ENCOUNTER — Other Ambulatory Visit: Payer: Self-pay

## 2021-11-20 NOTE — Addendum Note (Signed)
Addended by: Jackelyn Knife on: 11/20/2021 08:48 AM   Modules accepted: Orders

## 2021-11-24 ENCOUNTER — Other Ambulatory Visit: Payer: Self-pay

## 2021-11-27 ENCOUNTER — Other Ambulatory Visit: Payer: Self-pay

## 2021-12-04 ENCOUNTER — Other Ambulatory Visit: Payer: Self-pay

## 2021-12-04 ENCOUNTER — Ambulatory Visit: Payer: Self-pay | Attending: Internal Medicine | Admitting: Pharmacist

## 2021-12-04 DIAGNOSIS — E1142 Type 2 diabetes mellitus with diabetic polyneuropathy: Secondary | ICD-10-CM

## 2021-12-04 NOTE — Progress Notes (Signed)
Patient was educated on the use of the Ozempic pen. Reviewed necessary supplies and operation of the pen. Also reviewed goal blood glucose levels. Patient was able to demonstrate use. All questions and concerns were addressed.  Time spent counseling: 15 minutes Follow-up: 12/28/2021  Benard Halsted, PharmD, [place], [place] [phone]

## 2021-12-21 ENCOUNTER — Other Ambulatory Visit: Payer: Self-pay

## 2021-12-28 ENCOUNTER — Ambulatory Visit: Payer: Self-pay | Attending: Internal Medicine | Admitting: Pharmacist

## 2021-12-28 ENCOUNTER — Other Ambulatory Visit: Payer: Self-pay

## 2021-12-28 DIAGNOSIS — E1142 Type 2 diabetes mellitus with diabetic polyneuropathy: Secondary | ICD-10-CM

## 2021-12-28 MED ORDER — OZEMPIC (0.25 OR 0.5 MG/DOSE) 2 MG/1.5ML ~~LOC~~ SOPN
0.5000 mg | PEN_INJECTOR | SUBCUTANEOUS | 4 refills | Status: DC
  Filled 2021-12-28 – 2022-01-19 (×2): qty 1.5, 28d supply, fill #0
  Filled 2022-02-22 – 2022-02-25 (×2): qty 1.5, 28d supply, fill #1

## 2021-12-28 NOTE — Progress Notes (Signed)
° °  S:    PCP: Dr. Wynetta Emery  Patient presents for diabetes evaluation, education, and management. Patient was referred and last seen by Primary Care Provider on 11/19/2021. I saw him on 12/04/2021 and instructed him on proper Ozempic injection technique.   Today, patient reports that he is happy with the [place]. Denies any vomiting or abdominal pain. He has noticed improvement in CBGs at home. Today, he checked around 12p and saw 124.   Family/Social History:  -Family Hx: DM in grandmother -Tobacco: Never smoker  Human resources officer affordability: None  Medication adherence reported.  Current diabetes medications include: NPH insulin 34 units BID, Novolog 12 units TIDWC, metformin 500 mg XR - takes 1000 mg (2 tablets) BID, Ozempic 0.25 mg weekly   Patient denies hypoglycemic events.  Patient reported dietary habits: Eats 3 meals/day - Denies any changes in his diet since last visit  Patient-reported exercise habits:  -Plays paintball almost daily. Works as a Leisure centre manager, Conservation officer, historic buildings at [employer]:   [employer] Value Date   HGBA1C 9.0 (A) 11/19/2021   There were no vitals filed for this visit.  Lipid Panel     Component Value Date/Time   CHOL 212 (H) 07/16/2021 1638   TRIG 376 (H) 07/16/2021 1638   HDL 38 (L) 07/16/2021 1638   CHOLHDL 5.6 (H) 07/16/2021 1638   CHOLHDL 6.0 07/28/2019 0553   VLDL 26 07/28/2019 0553   LDLCALC 109 (H) 07/16/2021 1638   Home fasting blood sugars:  - 1st and 2nd week of Ozempic: 260s - low 300s - This week so far: 124 - 151 2 hour post-meal/random blood sugars: not checking  Clinical Atherosclerotic Cardiovascular Disease (ASCVD): No  The 10-year ASCVD risk score (Arnett DK, et al., 2019) is: 35.1%   Values used to calculate the score:     Age: 51 years     Sex: Male     Is Non-Hispanic African American: No     Diabetic: Yes     Tobacco smoker: Yes     Systolic Blood Pressure: 366 mmHg     Is BP treated: Yes      HDL Cholesterol: 38 mg/dL     Total Cholesterol: 212 mg/dL   A/P:  Diabetes longstanding currently uncontrolled but improving with Ozempic. Patient is able to verbalize appropriate hypoglycemia management plan. Medication adherence appears appropriate. -Continue current regimen.  -Pt advised to increase dose of Ozempic to 0.5mg  weekly starting next week.  -Extensively discussed pathophysiology of diabetes, recommended lifestyle interventions, dietary effects on blood sugar control -Counseled on s/sx of and management of hypoglycemia -Next A1C anticipated 01/2022.   Total time in face to face counseling 30 minutes.  Benard Halsted, PharmD, [place], [place] [phone]

## 2022-01-19 ENCOUNTER — Other Ambulatory Visit: Payer: Self-pay

## 2022-01-19 ENCOUNTER — Other Ambulatory Visit: Payer: Self-pay | Admitting: Internal Medicine

## 2022-01-19 MED ORDER — METFORMIN HCL ER 500 MG PO TB24
1000.0000 mg | ORAL_TABLET | Freq: Two times a day (BID) | ORAL | 2 refills | Status: DC
Start: 2022-01-19 — End: 2022-05-03
  Filled 2022-01-19: qty 120, 30d supply, fill #0
  Filled 2022-02-12: qty 120, 30d supply, fill #1
  Filled 2022-04-08: qty 120, 30d supply, fill #2

## 2022-02-12 ENCOUNTER — Other Ambulatory Visit: Payer: Self-pay

## 2022-02-12 ENCOUNTER — Other Ambulatory Visit: Payer: Self-pay | Admitting: Internal Medicine

## 2022-02-12 DIAGNOSIS — E1142 Type 2 diabetes mellitus with diabetic polyneuropathy: Secondary | ICD-10-CM

## 2022-02-12 MED ORDER — INSULIN NPH (HUMAN) (ISOPHANE) 100 UNIT/ML ~~LOC~~ SUSP
34.0000 [IU] | Freq: Two times a day (BID) | SUBCUTANEOUS | 0 refills | Status: DC
  Filled 2022-02-12: qty 10, 15d supply, fill #0

## 2022-02-12 NOTE — Telephone Encounter (Signed)
Requested Prescriptions  ?Pending Prescriptions Disp Refills  ?? insulin NPH Human (NOVOLIN N) 100 UNIT/ML injection 10 mL 0  ?  Sig: Inject 0.34 mLs (34 Units total) into the skin 2 (two) times daily at 8 am and 10 pm.  ?  ? Endocrinology:  Diabetes - Insulins Failed - 02/12/2022  2:10 AM  ?  ?  Failed - HBA1C is between 0 and 7.9 and within 180 days  ?  HbA1c, POC (controlled diabetic range)  ?Date Value Ref Range Status  ?11/19/2021 9.0 (A) 0.0 - 7.0 % Final  ?   ?  ?  Passed - Valid encounter within last 6 months  ?  Recent Outpatient Visits   ?      ? 1 month ago Type 2 diabetes mellitus with peripheral neuropathy ([place])  ? [place], RPH-CPP  ? 2 months ago Type 2 diabetes mellitus with peripheral neuropathy ([place])  ? [place], RPH-CPP  ? 2 months ago Type 2 diabetes mellitus with peripheral neuropathy ([place])  ? [place] Karle Plumber B, MD  ? 5 months ago Type 2 diabetes mellitus without complication, with long-term current use of insulin ([place])  ? [place], RPH-CPP  ? 7 months ago Type 2 diabetes mellitus with diabetic polyneuropathy, with long-term current use of insulin ([place])  ? [place] Ladell Pier, MD  ?  ?  ?Future Appointments   ?        ? In 1 month Ladell Pier, MD [place]  ?  ? ?  ?  ?  ? ? ?

## 2022-02-22 ENCOUNTER — Other Ambulatory Visit: Payer: Self-pay | Admitting: Internal Medicine

## 2022-02-22 ENCOUNTER — Other Ambulatory Visit: Payer: Self-pay

## 2022-02-22 DIAGNOSIS — Z794 Long term (current) use of insulin: Secondary | ICD-10-CM

## 2022-02-22 MED ORDER — INSULIN NPH (HUMAN) (ISOPHANE) 100 UNIT/ML ~~LOC~~ SUSP
34.0000 [IU] | Freq: Two times a day (BID) | SUBCUTANEOUS | 0 refills | Status: DC
  Filled 2022-02-22: qty 10, 15d supply, fill #0

## 2022-02-23 ENCOUNTER — Other Ambulatory Visit: Payer: Self-pay

## 2022-02-25 ENCOUNTER — Other Ambulatory Visit: Payer: Self-pay

## 2022-03-03 ENCOUNTER — Other Ambulatory Visit: Payer: Self-pay

## 2022-03-03 ENCOUNTER — Other Ambulatory Visit: Payer: Self-pay | Admitting: Internal Medicine

## 2022-03-03 DIAGNOSIS — Z794 Long term (current) use of insulin: Secondary | ICD-10-CM

## 2022-03-03 MED ORDER — INSULIN NPH (HUMAN) (ISOPHANE) 100 UNIT/ML ~~LOC~~ SUSP
34.0000 [IU] | Freq: Two times a day (BID) | SUBCUTANEOUS | 0 refills | Status: DC
  Filled 2022-03-03: qty 10, 15d supply, fill #0

## 2022-03-08 ENCOUNTER — Other Ambulatory Visit: Payer: Self-pay

## 2022-03-10 ENCOUNTER — Other Ambulatory Visit: Payer: Self-pay

## 2022-03-12 ENCOUNTER — Other Ambulatory Visit: Payer: Self-pay

## 2022-03-12 MED ORDER — OZEMPIC (0.25 OR 0.5 MG/DOSE) 2 MG/3ML ~~LOC~~ SOPN
PEN_INJECTOR | SUBCUTANEOUS | 3 refills | Status: DC
Start: 2021-12-28 — End: 2022-03-25
  Filled 2022-03-12: qty 12, 120d supply, fill #0

## 2022-03-18 ENCOUNTER — Other Ambulatory Visit: Payer: Self-pay

## 2022-03-22 ENCOUNTER — Ambulatory Visit: Payer: Self-pay | Admitting: Internal Medicine

## 2022-03-25 ENCOUNTER — Ambulatory Visit: Payer: Self-pay | Admitting: Internal Medicine

## 2022-03-25 ENCOUNTER — Other Ambulatory Visit: Payer: Self-pay

## 2022-03-25 ENCOUNTER — Ambulatory Visit: Payer: Self-pay | Attending: Internal Medicine | Admitting: Internal Medicine

## 2022-03-25 DIAGNOSIS — E785 Hyperlipidemia, unspecified: Secondary | ICD-10-CM

## 2022-03-25 DIAGNOSIS — I152 Hypertension secondary to endocrine disorders: Secondary | ICD-10-CM

## 2022-03-25 DIAGNOSIS — E1169 Type 2 diabetes mellitus with other specified complication: Secondary | ICD-10-CM

## 2022-03-25 DIAGNOSIS — Z23 Encounter for immunization: Secondary | ICD-10-CM

## 2022-03-25 DIAGNOSIS — E1142 Type 2 diabetes mellitus with diabetic polyneuropathy: Secondary | ICD-10-CM

## 2022-03-25 DIAGNOSIS — E1159 Type 2 diabetes mellitus with other circulatory complications: Secondary | ICD-10-CM

## 2022-03-25 LAB — POCT GLYCOSYLATED HEMOGLOBIN (HGB A1C): HbA1c, POC (controlled diabetic range): 7 % (ref 0.0–7.0)

## 2022-03-25 LAB — GLUCOSE, POCT (MANUAL RESULT ENTRY): POC Glucose: 104 mg/dl — AB (ref 70–99)

## 2022-03-25 MED ORDER — INSULIN NPH (HUMAN) (ISOPHANE) 100 UNIT/ML ~~LOC~~ SUSP
34.0000 [IU] | Freq: Two times a day (BID) | SUBCUTANEOUS | 6 refills | Status: DC
  Filled 2022-03-25: qty 30, 44d supply, fill #0
  Filled 2022-05-03: qty 30, 44d supply, fill #1
  Filled 2022-06-29: qty 20, 29d supply, fill #2
  Filled 2022-07-24: qty 20, 29d supply, fill #3
  Filled 2022-09-06: qty 20, 29d supply, fill #4
  Filled 2022-10-14: qty 20, 29d supply, fill #5
  Filled 2022-11-23: qty 20, 29d supply, fill #6

## 2022-03-25 MED ORDER — SEMAGLUTIDE (1 MG/DOSE) 4 MG/3ML ~~LOC~~ SOPN
1.0000 mg | PEN_INJECTOR | SUBCUTANEOUS | 6 refills | Status: DC
  Filled 2022-03-25: qty 3, 28d supply, fill #0
  Filled 2022-04-20: qty 9, 84d supply, fill #0
  Filled 2022-06-29: qty 3, 28d supply, fill #1
  Filled 2022-11-06: qty 3, 28d supply, fill #2
  Filled 2022-11-08: qty 9, 84d supply, fill #2

## 2022-03-25 MED ORDER — ALBUTEROL SULFATE HFA 108 (90 BASE) MCG/ACT IN AERS
1.0000 | INHALATION_SPRAY | Freq: Four times a day (QID) | RESPIRATORY_TRACT | 6 refills | Status: DC | PRN
  Filled 2022-03-25: qty 8.5, 25d supply, fill #0
  Filled 2022-06-29: qty 8.5, 25d supply, fill #1

## 2022-03-25 NOTE — Progress Notes (Signed)
  Patient ID: Bradley Cox, male    DOB: 07/23/1971  MRN: [id number]  CC: Diabetes   Subjective: Bradley Cox is a 50 y.o. male who presents for chronic ds management His concerns today include:  Patient with history of DM type II with neuropathy, morbid obesity, HTN,  HL, DVT right leg, asthma. MSSA bacteremia, depression.    DM: Results for orders placed or performed in visit on 03/25/22  POCT glucose (manual entry)  Result Value Ref Range   POC Glucose 104 (A) 70 - 99 mg/dl  POCT glycosylated hemoglobin (Hb A1C)  Result Value Ref Range   Hemoglobin A1C     HbA1c POC (<> result, manual entry)     HbA1c, POC (prediabetic range)     HbA1c, POC (controlled diabetic range) 7.0 0.0 - 7.0 %  A1c has improved. On last visit we added Ozempic.  Currently on 0.5 mg weekly.  He is tolerating the medicine well.  Reports occasional diarrhea with it.  Compliant with metformin 1 g twice a day, NovoLog 12 units 3 times a day with meals, NPH insulin 34 units twice a day. -He has not had any significant weight loss with the Ozempic so far.  However he reports that he does not eat a whole lot. Checks blood sugars daily in the mornings.  Range has been 120s to 130s.  HTN: Reports compliance with Cozaar 50 mg daily and hydralazine 50 mg 3 times a day.  He drinks 2 cups of coffee in the mornings.  He limits salt in the foods.  He checks blood pressure about once a week and gives systolic range of between 130s to 140s.  HL: Compliant with Lipitor and tolerating the medicine.  Saw dermatology since last visit with me for lesion on the right nostril.  He was told that it is most likely basal cell cancer.  Biopsy has been scheduled.  HM: Due for Shingrix.  He is agreeable to receiving the first shot today.   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 (HCC) 10/20/2019   Drug induced constipation    Uncontrolled type 2 diabetes mellitus with hyperglycemia (HCC)    Acute blood loss anemia    Postoperative pain    MSSA bacteremia 07/10/2019   Abscess of right hip 07/09/2019   Compression of right sciatic nerve 07/09/2019   Morbid obesity (HCC) 01/21/2009   GERD 01/21/2009   DYSPHAGIA 01/21/2009   Essential hypertension 01/07/2009   ASTHMA 01/07/2009     Current Outpatient Medications on File Prior to Visit  Medication Sig Dispense Refill   acetaminophen (TYLENOL) 325 MG tablet Take 2 tablets (650 mg total) by mouth every 6 (six) hours as needed for mild pain.     atorvastatin (LIPITOR) 10 MG tablet TAKE 1 TABLET (10 MG TOTAL) BY MOUTH DAILY. 90 tablet 2   Blood Glucose Monitoring Suppl (TRUE METRIX METER) w/Device KIT Use as directed 1 kit 0   DULoxetine (CYMBALTA) 20 MG capsule TAKE 2 CAPSULES (40 MG TOTAL) BY MOUTH AT BEDTIME. 180 capsule 0   gabapentin (NEURONTIN) 300 MG capsule TAKE 1 CAPSULE BY MOUTH IN THE MORNING AND 2 CAPSULES IN THE EVENING 90 capsule 6   glucose blood (TRUE METRIX BLOOD GLUCOSE TEST) test strip Use as instructed 100 each 12     hydrALAZINE (APRESOLINE) 50 MG tablet Take 1 tablet (50 mg total) by mouth 3 (three) times daily. 270 tablet 2   insulin aspart (NOVOLOG) 100 UNIT/ML FlexPen Inject 12 Units into the skin 3 (three) times daily with meals. 15 mL 11   Insulin Syringe-Needle U-100 (BD INSULIN SYRINGE ULTRAFINE) 31G X 15/64" 1 ML MISC Use as directed 100 each 4   losartan (COZAAR) 50 MG tablet TAKE 1 TABLET (50 MG TOTAL) BY MOUTH DAILY. 90 tablet 2   metFORMIN (GLUCOPHAGE-XR) 500 MG 24 hr tablet Take 2 tablets (1,000 mg total) by mouth 2 (two) times daily. 120 tablet 2   Multiple Vitamin (THERA) TABS Take 2 tablets by mouth daily.     TRUEplus Lancets 28G MISC Use as directed 100 each 4   No current facility-administered medications on file prior to visit.     Allergies  Allergen Reactions   Lisinopril Cough   Toradol [Ketorolac Tromethamine]     SOB, sweating. Tolerates Ibuprofen. Wife reports reaction may have been due to injection.    Social History   Socioeconomic History   Marital status: Single    Spouse name: Not on file   Number of children: Not on file   Years of education: Not on file   Highest education level: Not on file  Occupational History   Not on file  Tobacco Use   Smoking status: Never   Smokeless tobacco: Current    Types: Snuff  Substance and Sexual Activity   Alcohol use: Yes    Comment: rarely   Drug use: Never   Sexual activity: Not on file  Other Topics Concern   Not on file  Social History Narrative   Not on file   Social Determinants of Health   Financial Resource Strain: Not on file  Food Insecurity: Not on file  Transportation Needs: Not on file  Physical Activity: Not on file  Stress: Not on file  Social Connections: Not on file  Intimate Partner Violence: Not on file    Family History  Problem Relation Age of Onset   Diabetes Paternal Grandmother     Past Surgical History:  Procedure Laterality Date   APPLICATION OF WOUND VAC Right 07/17/2019   Procedure: Application Of Wound Vac;  Surgeon: Altamese Grinnell, MD;  Location: [place];  Service: Orthopedics;  Laterality: Right;   APPLICATION OF WOUND VAC Right 07/20/2019   Procedure: Application Of Wound Vac;  Surgeon: Altamese [place], MD;  Location: [place];  Service: Orthopedics;  Laterality: Right;   APPLICATION OF WOUND VAC Right 07/27/2019   Procedure: Application Of Wound Vac;  Surgeon: Shona Needles, MD;  Location: [place];  Service: Orthopedics;  Laterality: Right;   HERNIA REPAIR     As a baby   I & D EXTREMITY Right 07/27/2019   Procedure: IRRIGATION AND DEBRIDEMENT EXTREMITY Right Leg;  Surgeon: Shona Needles, MD;  Location: [place];  Service: Orthopedics;  Laterality: Right;   I & D EXTREMITY Right 07/30/2019   Procedure:  IRRIGATION AND DEBRIDEMENT EXTREMITY WITH WOUND VAC CHANGE;  Surgeon: Shona Needles, MD;  Location: [place];  Service: Orthopedics;  Laterality: Right;  IRRIGATION AND DEBRIDEMENT EXTREMITY WITH WOUND VAC CHANGE   I & D EXTREMITY Right 08/08/2019   Procedure: IRRIGATION AND DEBRIDEMENT RIGHT EXTREMITY;  Surgeon: Shona Needles, MD;  Location: [place];  Service: Orthopedics;  Laterality: Right;   INCISION AND DRAINAGE ABSCESS Right 07/09/2019   Procedure: INCISION AND DRAINAGE ABSCESS RIGHT  HIP/PELVIS;  Surgeon: Shona Needles, MD;  Location: [place];  Service: Orthopedics;  Laterality: Right;   INCISION AND DRAINAGE HIP Right 07/17/2019   Procedure: IRRIGATION AND DEBRIDEMENT HIP;  Surgeon: Altamese [place], MD;  Location: [place];  Service: Orthopedics;  Laterality: Right;   INCISION AND DRAINAGE HIP Right 07/20/2019   Procedure: Irrigation And Debridement Hip;  Surgeon: Altamese Walker, MD;  Location: [place];  Service: Orthopedics;  Laterality: Right;   TEE WITHOUT CARDIOVERSION N/A 07/11/2019   Procedure: TRANSESOPHAGEAL ECHOCARDIOGRAM (TEE);  Surgeon: Sanda Klein, MD;  [facility];  Service: Cardiovascular;  Laterality: N/A;    ROS: Review of Systems Negative except as stated above  PHYSICAL EXAM: BP 132/84   Pulse 86   Ht 6' 6" (1.981 m)   Wt (!) 432 lb 9.6 oz (196.2 kg)   SpO2 94%   BMI 49.99 kg/m   Wt Readings from Last 3 Encounters:  03/25/22 (!) 432 lb 9.6 oz (196.2 kg)  07/16/21 (!) 408 lb (185.1 kg)  07/01/20 (!) 381 lb (172.8 kg)    Physical Exam   General appearance - alert, well appearing, morbidly obese middle-age Caucasian male and in no distress Mental status - normal mood, behavior, speech, dress, motor activity, and thought processes Neck - supple, no significant adenopathy Chest - clear to auscultation, no wheezes, rales or rhonchi, symmetric air entry Heart - normal rate, regular rhythm, normal S1, S2, no murmurs, rubs, clicks or gallops Extremities -  peripheral pulses normal, no pedal edema, no clubbing or cyanosis Diabetic Foot Exam - Simple   Simple Foot Form Visual Inspection No deformities, no ulcerations, no other skin breakdown bilaterally: Yes Sensation Testing See comments: Yes Pulse Check Posterior Tibialis and Dorsalis pulse intact bilaterally: Yes Comments Decreased sensation on the soles of the feet on leap exam.        Latest Ref Rng & Units 07/16/2021    4:38 PM 07/01/2020    4:33 PM 08/27/2019    3:44 AM  CMP  Glucose 65 - 99 mg/dL 230   256   146    BUN 6 - 24 mg/dL 12   12   <5    Creatinine 0.76 - 1.27 mg/dL 0.81   0.73   0.57    Sodium 134 - 144 mmol/L 139   138   137    Potassium 3.5 - 5.2 mmol/L 4.1   4.3   3.7    Chloride 96 - 106 mmol/L 97   98   100    CO2 20 - 29 mmol/L _0 Calcium 8.7 - 10.2 mg/dL 9.7   10.2   8.7    Total Protein 6.0 - 8.5 g/dL 7.4   7.8   6.2    Total Bilirubin 0.0 - 1.2 mg/dL 0.4   0.5   <0.1    Alkaline Phos 44 - 121 IU/L 100   118   210    AST 0 - 40 IU/L 35   18   31    ALT 0 - 44 IU/L 50   23   31     Lipid Panel     Component Value Date/Time   CHOL 212 (H) 07/16/2021 1638   TRIG 376 (H) 07/16/2021 1638   HDL 38 (L) 07/16/2021 1638   CHOLHDL 5.6 (H) 07/16/2021 1638   CHOLHDL 6.0 07/28/2019 0553   VLDL 26 07/28/2019 0553   LDLCALC 109 (H)  07/16/2021 1638    CBC    Component Value Date/Time   WBC 6.6 07/16/2021 1638   WBC 4.0 08/27/2019 0344   RBC 5.36 07/16/2021 1638   RBC 2.75 (L) 08/27/2019 0344   HGB 16.3 07/16/2021 1638   HCT 47.8 07/16/2021 1638   PLT 180 07/16/2021 1638   MCV 89 07/16/2021 1638   MCH 30.4 07/16/2021 1638   MCH 27.3 08/27/2019 0344   MCHC 34.1 07/16/2021 1638   MCHC 29.1 (L) 08/27/2019 0344   RDW 12.7 07/16/2021 1638   LYMPHSABS 1.5 08/27/2019 0344   MONOABS 0.5 08/27/2019 0344   EOSABS 0.3 08/27/2019 0344   BASOSABS 0.0 08/27/2019 0344    ASSESSMENT AND PLAN: 1. Type 2 diabetes mellitus with morbid obesity  (HCC) Close to goal.  Commended him on decreasing his A1c.  Encouraged him to try to eat healthy but smaller portions.  We discussed increasing the Ozempic to 1 mg/week.  Patient is agreeable to this.  Continue current dose of NPH, NovoLog, metformin as listed above. - POCT glucose (manual entry) - POCT glycosylated hemoglobin (Hb A1C) - insulin NPH Human (HUMULIN N) 100 UNIT/ML injection; Inject 0.34 mLs (34 Units total) into the skin 2 (two) times daily at 8 am and 10 pm.  Dispense: 30 mL; Refill: 6 - Semaglutide, 1 MG/DOSE, 4 MG/3ML SOPN; Inject 1 mg as directed once a week.  Dispense: 3 mL; Refill: 6  2. Hypertension associated with diabetes (HCC) Close to goal.  Continue current dose of Cozaar and hydralazine.  3. Hyperlipidemia associated with type 2 diabetes mellitus (HCC) Continue atorvastatin.  4. Need for shingles vaccine First Shingrix shot given today.  Advised that it can cause some soreness and redness at the injection site.      Patient was given the opportunity to ask questions.  Patient verbalized understanding of the plan and was able to repeat key elements of the plan.   This documentation was completed using Dragon voice recognition technology.  Any transcriptional errors are unintentional.  Orders Placed This Encounter  Procedures   POCT glucose (manual entry)   POCT glycosylated hemoglobin (Hb A1C)     Requested Prescriptions   Signed Prescriptions Disp Refills   insulin NPH Human (HUMULIN N) 100 UNIT/ML injection 30 mL 6    Sig: Inject 0.34 mLs (34 Units total) into the skin 2 (two) times daily at 8 am and 10 pm.   albuterol (VENTOLIN HFA) 108 (90 Base) MCG/ACT inhaler 8.5 g 6    Sig: Inhale 1 puff into the lungs every 6 (six) hours as needed for wheezing or shortness of breath.   Semaglutide, 1 MG/DOSE, 4 MG/3ML SOPN 3 mL 6    Sig: Inject 1 mg as directed once a week.    Return in about 4 months (around 07/26/2022).  Deborah Johnson, MD, FACP 

## 2022-03-26 ENCOUNTER — Other Ambulatory Visit: Payer: Self-pay

## 2022-03-31 ENCOUNTER — Other Ambulatory Visit: Payer: Self-pay

## 2022-04-08 ENCOUNTER — Other Ambulatory Visit: Payer: Self-pay

## 2022-04-20 ENCOUNTER — Other Ambulatory Visit: Payer: Self-pay

## 2022-04-23 ENCOUNTER — Other Ambulatory Visit: Payer: Self-pay

## 2022-04-29 ENCOUNTER — Other Ambulatory Visit: Payer: Self-pay

## 2022-04-30 ENCOUNTER — Other Ambulatory Visit (HOSPITAL_COMMUNITY): Payer: Self-pay

## 2022-05-03 ENCOUNTER — Other Ambulatory Visit: Payer: Self-pay | Admitting: Internal Medicine

## 2022-05-03 ENCOUNTER — Other Ambulatory Visit: Payer: Self-pay

## 2022-05-03 MED ORDER — METFORMIN HCL ER 500 MG PO TB24
1000.0000 mg | ORAL_TABLET | Freq: Two times a day (BID) | ORAL | 2 refills | Status: DC
  Filled 2022-05-03: qty 120, 30d supply, fill #0
  Filled 2022-06-29: qty 120, 30d supply, fill #1
  Filled 2022-07-24: qty 120, 30d supply, fill #2

## 2022-05-05 ENCOUNTER — Other Ambulatory Visit: Payer: Self-pay

## 2022-05-06 ENCOUNTER — Other Ambulatory Visit: Payer: Self-pay

## 2022-06-10 ENCOUNTER — Other Ambulatory Visit: Payer: Self-pay

## 2022-06-14 ENCOUNTER — Other Ambulatory Visit: Payer: Self-pay

## 2022-06-29 ENCOUNTER — Other Ambulatory Visit: Payer: Self-pay | Admitting: Pharmacist

## 2022-06-29 ENCOUNTER — Other Ambulatory Visit: Payer: Self-pay

## 2022-06-29 MED ORDER — ALBUTEROL SULFATE HFA 108 (90 BASE) MCG/ACT IN AERS
1.0000 | INHALATION_SPRAY | Freq: Four times a day (QID) | RESPIRATORY_TRACT | 1 refills | Status: AC | PRN
  Filled 2022-06-29: qty 6.7, 50d supply, fill #0

## 2022-07-24 ENCOUNTER — Other Ambulatory Visit: Payer: Self-pay | Admitting: Internal Medicine

## 2022-07-24 DIAGNOSIS — E1142 Type 2 diabetes mellitus with diabetic polyneuropathy: Secondary | ICD-10-CM

## 2022-07-24 DIAGNOSIS — E1169 Type 2 diabetes mellitus with other specified complication: Secondary | ICD-10-CM

## 2022-07-24 DIAGNOSIS — I10 Essential (primary) hypertension: Secondary | ICD-10-CM

## 2022-07-26 ENCOUNTER — Other Ambulatory Visit: Payer: Self-pay

## 2022-07-26 NOTE — Telephone Encounter (Signed)
Requested medication (s) are due for refill today: yes  Requested medication (s) are on the active medication list: yes  Last refill:  Novologue 07/16/2021 57m wit 11 RF, Lipitor 07/16/2021 #90 with 2, Cozaar 07/16/2021 #90 with 2 RF  Future visit scheduled: tomorrow  Notes to clinic:  labs are slightly out of date, pt has appt tomorrow, please assess.      Requested Prescriptions  Pending Prescriptions Disp Refills   insulin aspart (NOVOLOG FLEXPEN) 100 UNIT/ML FlexPen 15 mL 11    Sig: Inject 12 Units into the skin 3 (three) times daily with meals.     Endocrinology:  Diabetes - Insulins Passed - 07/24/2022 10:14 AM      Passed - HBA1C is between 0 and 7.9 and within 180 days    HbA1c, POC (controlled diabetic range)  Date Value Ref Range Status  03/25/2022 7.0 0.0 - 7.0 % Final         Passed - Valid encounter within last 6 months    Recent Outpatient Visits           4 months ago Type 2 diabetes mellitus with morbid obesity ([place]   [place]   7 months ago Type 2 diabetes mellitus with peripheral neuropathy ([facility]   [place] SAnnie MainL, RPH-CPP   7 months ago Type 2 diabetes mellitus with peripheral neuropathy ([facility]   [place] SAnnie MainL, RPH-CPP   8 months ago Type 2 diabetes mellitus with peripheral neuropathy ([facility]   [place] Plumber[place], MD   11 months ago Type 2 diabetes mellitus without complication, with long-term current use of insulin ([facility]   [place] SJarome Matin RPH-CPP       Future Appointments             Tomorrow JLadell Pier MD [place]            atorvastatin (LIPITOR) 10 MG tablet 90 tablet 2    Sig: TAKE 1 TABLET (10 MG TOTAL) BY MOUTH DAILY.     Cardiovascular:  Antilipid -  Statins Failed - 07/24/2022 10:14 AM      Failed - Lipid Panel in normal range within the last 12 months    Cholesterol, Total  Date Value Ref Range Status  07/16/2021 212 (H) 100 - 199 mg/dL Final   LDL Chol Calc (NIH)  Date Value Ref Range Status  07/16/2021 109 (H) 0 - 99 mg/dL Final   HDL  Date Value Ref Range Status  07/16/2021 38 (L) >39 mg/dL Final   Triglycerides  Date Value Ref Range Status  07/16/2021 376 (H) 0 - 149 mg/dL Final         Passed - Patient is not pregnant      Passed - Valid encounter within last 12 months    Recent Outpatient Visits           4 months ago Type 2 diabetes mellitus with morbid obesity ([place]   [place] D[place]oma LamingB, MD   7 months ago Type 2 diabetes mellitus with peripheral neuropathy ([facility]   [place] SAnnie MainL, RPH-CPP   7 months ago Type 2 diabetes mellitus with peripheral neuropathy ([facility]   [place], Jarome Matin, RPH-CPP   8 months ago Type 2 diabetes mellitus with peripheral neuropathy [facility])   [place] Karle Plumber B, MD   11 months ago Type 2 diabetes mellitus without complication, with long-term current use of insulin [facility])   [place], Jarome Matin, RPH-CPP       Future Appointments             Tomorrow Ladell Pier, MD [place]             losartan (COZAAR) 50 MG tablet 90 tablet 2    Sig: TAKE 1 TABLET (50 MG TOTAL) BY MOUTH DAILY.     Cardiovascular:  Angiotensin Receptor Blockers Failed - 07/24/2022 10:14 AM      Failed - Cr in normal range and within 180 days    Creatinine, Ser  Date Value Ref Range Status  07/16/2021 0.81 0.76 - 1.27 mg/dL Final         Failed - K in normal range and within 180 days    Potassium  Date Value Ref Range Status  07/16/2021 4.1 3.5 -  5.2 mmol/L Final         Passed - Patient is not pregnant      Passed - Last BP in normal range    BP Readings from Last 1 Encounters:  03/25/22 132/84         Passed - Valid encounter within last 6 months    Recent Outpatient Visits           4 months ago Type 2 diabetes mellitus with morbid obesity ([place])   [place], Neoma Laming B, MD   7 months ago Type 2 diabetes mellitus with peripheral neuropathy [facility])   [place], Annie Main L, RPH-CPP   7 months ago Type 2 diabetes mellitus with peripheral neuropathy [facility])   [place], Annie Main L, RPH-CPP   8 months ago Type 2 diabetes mellitus with peripheral neuropathy [facility])   [place] Karle Plumber B, MD   11 months ago Type 2 diabetes mellitus without complication, with long-term current use of insulin [facility])   [place], Jarome Matin, RPH-CPP       Future Appointments             Tomorrow Ladell Pier, MD [place]

## 2022-07-27 ENCOUNTER — Ambulatory Visit: Payer: Self-pay | Attending: Internal Medicine | Admitting: Internal Medicine

## 2022-07-27 ENCOUNTER — Other Ambulatory Visit: Payer: Self-pay

## 2022-07-27 ENCOUNTER — Other Ambulatory Visit: Payer: Self-pay | Admitting: Internal Medicine

## 2022-07-27 ENCOUNTER — Encounter: Payer: Self-pay | Admitting: Internal Medicine

## 2022-07-27 ENCOUNTER — Other Ambulatory Visit: Payer: Self-pay | Admitting: Pharmacist

## 2022-07-27 DIAGNOSIS — L989 Disorder of the skin and subcutaneous tissue, unspecified: Secondary | ICD-10-CM

## 2022-07-27 DIAGNOSIS — F320 Major depressive disorder, single episode, mild: Secondary | ICD-10-CM

## 2022-07-27 DIAGNOSIS — E1142 Type 2 diabetes mellitus with diabetic polyneuropathy: Secondary | ICD-10-CM

## 2022-07-27 DIAGNOSIS — Z23 Encounter for immunization: Secondary | ICD-10-CM

## 2022-07-27 DIAGNOSIS — E1169 Type 2 diabetes mellitus with other specified complication: Secondary | ICD-10-CM

## 2022-07-27 DIAGNOSIS — E1165 Type 2 diabetes mellitus with hyperglycemia: Secondary | ICD-10-CM

## 2022-07-27 DIAGNOSIS — E1159 Type 2 diabetes mellitus with other circulatory complications: Secondary | ICD-10-CM

## 2022-07-27 DIAGNOSIS — I152 Hypertension secondary to endocrine disorders: Secondary | ICD-10-CM

## 2022-07-27 DIAGNOSIS — E785 Hyperlipidemia, unspecified: Secondary | ICD-10-CM

## 2022-07-27 LAB — POCT GLYCOSYLATED HEMOGLOBIN (HGB A1C): HbA1c, POC (controlled diabetic range): 5.9 % (ref 0.0–7.0)

## 2022-07-27 MED ORDER — "INSULIN SYRINGE-NEEDLE U-100 31G X 15/64"" 1 ML MISC"
4 refills | Status: AC
  Filled 2022-07-27: qty 100, fill #0
  Filled 2022-11-06: qty 100, 50d supply, fill #0

## 2022-07-27 MED ORDER — INSULIN ASPART 100 UNIT/ML FLEXPEN
12.0000 [IU] | PEN_INJECTOR | Freq: Three times a day (TID) | SUBCUTANEOUS | 11 refills | Status: DC
  Filled 2022-07-27: qty 15, 41d supply, fill #0
  Filled 2022-09-06: qty 15, 41d supply, fill #1
  Filled 2022-11-06: qty 30, 83d supply, fill #2
  Filled 2022-12-23: qty 30, 83d supply, fill #3

## 2022-07-27 NOTE — Progress Notes (Signed)
No energy Not sleeping

## 2022-07-27 NOTE — Progress Notes (Signed)
Patient ID: Bradley Cox, male    DOB: April 28, 1971  MRN: [id number]  CC: Diabetes   Subjective: Bradley Cox is a 51 y.o. male who presents for chronic disease management His concerns today include:  Patient with history of DM type II with neuropathy, morbid obesity, HTN,  HL, DVT right leg, asthma. MSSA bacteremia, depression.     DM: Results for orders placed or performed in visit on 07/27/22  POCT glycosylated hemoglobin (Hb A1C)  Result Value Ref Range   Hemoglobin A1C     HbA1c POC (<> result, manual entry)     HbA1c, POC (prediabetic range)     HbA1c, POC (controlled diabetic range) 5.9 0.0 - 7.0 %     Currently on Ozempic, NovoLog 12 units 3 times daily with meals, NPH insulin 34 units twice a day, Metformin 1 gram BID. Tolerating Ozempic better.  Initially it was causing a lot of diarrhea but this has improved significantly.  Ozempic is also decreased his appetite.  He has lost 15 pounds since last visit with me 4 months ago.   -checking BS every morning.  Avg 1 mth 115.  Low  a few times in the last few wks.  Now carries glucose tabs.  Lowest was 72 but he had not eaten that day Over due for eye exam.  RT eye swells sometimes during the night and hurts with movement of eye ball.  HTN: Reports compliance with Cozaar 50 mg daily and hydralazine 50 mg 3 times a day.  He limits salt in the foods. -home BP device npo longer works  HL:  taking and tolerating Lipitor.  Dark crusting RT nostril x 1 yr.  Bleeds at time.  This was discussed in the past.  Plan is to get him to dermatology but no insurance.  PHQ9 still positive.  Out Cymbalta for a while.  He feels that he is in a better place mentally compared to a year or 2 ago.  Does not feel that he needs to be back on the Cymbalta at this time.  No suicidal thoughts. Depressed about not having a job.  He plans to apply to the [employer] plant [place].  HM: due for flu shot and 2nd shingles  vaccine Patient Active Problem List   Diagnosis Date Noted   Lesion of skin of nose 07/01/2020   Primary insomnia 02/29/2020   Obesity (BMI 30-39.9) 10/20/2019   Current moderate episode of major depressive disorder without prior episode ([place]) 10/20/2019   Drug induced constipation    Uncontrolled type 2 diabetes mellitus with hyperglycemia ([place])    Acute blood loss anemia    Postoperative pain    MSSA bacteremia 07/10/2019   Abscess of right hip 07/09/2019   Compression of right sciatic nerve 07/09/2019   Morbid obesity ([place]) 01/21/2009   GERD 01/21/2009   DYSPHAGIA 01/21/2009   Essential hypertension 01/07/2009   ASTHMA 01/07/2009     Current Outpatient Medications on File Prior to Visit  Medication Sig Dispense Refill   acetaminophen (TYLENOL) 325 MG tablet Take 2 tablets (650 mg total) by mouth every 6 (six) hours as needed for mild pain.     albuterol (PROVENTIL HFA) 108 (90 Base) MCG/ACT inhaler Inhale 1 puff into the lungs every 6 (six) hours as needed for wheezing or shortness of breath. 6.7 g 1   atorvastatin (LIPITOR) 10 MG tablet TAKE 1 TABLET (10 MG TOTAL) BY MOUTH DAILY. [place]  tablet 2   Blood Glucose Monitoring Suppl (TRUE METRIX METER) w/Device KIT Use as directed 1 kit 0   glucose blood (TRUE METRIX BLOOD GLUCOSE TEST) test strip Use as instructed 100 each 12   hydrALAZINE (APRESOLINE) 50 MG tablet Take 1 tablet (50 mg total) by mouth 3 (three) times daily. 270 tablet 2   insulin NPH Human (HUMULIN N) 100 UNIT/ML injection Inject 0.34 mLs (34 Units total) into the skin 2 (two) times daily at 8 am and 10 pm. 30 mL 6   Insulin Syringe-Needle U-100 (BD INSULIN SYRINGE ULTRAFINE) 31G X 15/64" 1 ML MISC Use as directed 100 each 4   losartan (COZAAR) 50 MG tablet TAKE 1 TABLET (50 MG TOTAL) BY MOUTH DAILY. 90 tablet 2   metFORMIN (GLUCOPHAGE-XR) 500 MG 24 hr tablet Take 2 tablets (1,000 mg total) by mouth 2 (two) times daily. 120 tablet 2   Multiple Vitamin (THERA) TABS Take 2  tablets by mouth daily.     Semaglutide, 1 MG/DOSE, 4 MG/3ML SOPN Inject 1 mg as directed once a week. 3 mL 6   TRUEplus Lancets 28G MISC Use as directed 100 each 4   gabapentin (NEURONTIN) 300 MG capsule TAKE 1 CAPSULE BY MOUTH IN THE MORNING AND 2 CAPSULES IN THE EVENING 90 capsule 6   No current facility-administered medications on file prior to visit.    Allergies  Allergen Reactions   Lisinopril Cough   Toradol [Ketorolac Tromethamine]     SOB, sweating. Tolerates Ibuprofen. Wife reports reaction may have been due to injection.    Social History   Socioeconomic History   Marital status: Single    Spouse name: Not on file   Number of children: Not on file   Years of education: Not on file   Highest education level: Not on file  Occupational History   Not on file  Tobacco Use   Smoking status: Never   Smokeless tobacco: Current    Types: Snuff  Substance and Sexual Activity   Alcohol use: Yes    Comment: rarely   Drug use: Never   Sexual activity: Not on file  Other Topics Concern   Not on file  Social History Narrative   Not on file   Social Determinants of Health   Financial Resource Strain: Not on file  Food Insecurity: Not on file  Transportation Needs: Not on file  Physical Activity: Not on file  Stress: Not on file  Social Connections: Not on file  Intimate Partner Violence: Not on file    Family History  Problem Relation Age of Onset   Diabetes Paternal Grandmother     Past Surgical History:  Procedure Laterality Date   APPLICATION OF WOUND VAC Right 07/17/2019   Procedure: Application Of Wound Vac;  Surgeon: Altamese Chur[place], MD;  Location: [place];  Service: Orthopedics;  Laterality: Right;   APPLICATION OF WOUND VAC Right 07/20/2019   Procedure: Application Of Wound Vac;  Surgeon: Altamese [place], MD;  Location: [place];  Service: Orthopedics;  Laterality: Right;   APPLICATION OF WOUND VAC Right 07/27/2019   Procedure: Application Of Wound Vac;   Surgeon: Shona Needles, MD;  Location: [place];  Service: Orthopedics;  Laterality: Right;   HERNIA REPAIR     As a baby   I & D EXTREMITY Right 07/27/2019   Procedure: IRRIGATION AND DEBRIDEMENT EXTREMITY Right Leg;  Surgeon: Shona Needles, MD;  Location: [place];  Service: Orthopedics;  Laterality: Right;  I & D EXTREMITY Right 07/30/2019   Procedure: IRRIGATION AND DEBRIDEMENT EXTREMITY WITH WOUND VAC CHANGE;  Surgeon: Shona Needles, MD;  Location: [place];  Service: Orthopedics;  Laterality: Right;  IRRIGATION AND DEBRIDEMENT EXTREMITY WITH WOUND VAC CHANGE   I & D EXTREMITY Right 08/08/2019   Procedure: IRRIGATION AND DEBRIDEMENT RIGHT EXTREMITY;  Surgeon: Shona Needles, MD;  Location: [place];  Service: Orthopedics;  Laterality: Right;   INCISION AND DRAINAGE ABSCESS Right 07/09/2019   Procedure: INCISION AND DRAINAGE ABSCESS RIGHT HIP/PELVIS;  Surgeon: Shona Needles, MD;  Location: [facility];  Service: Orthopedics;  Laterality: Right;   INCISION AND DRAINAGE HIP Right 07/17/2019   Procedure: IRRIGATION AND DEBRIDEMENT HIP;  Surgeon: Altamese [place], MD;  Location: [place];  Service: Orthopedics;  Laterality: Right;   INCISION AND DRAINAGE HIP Right 07/20/2019   Procedure: Irrigation And Debridement Hip;  Surgeon: Altamese [place], MD;  Location: [place];  Service: Orthopedics;  Laterality: Right;   TEE WITHOUT CARDIOVERSION N/A 07/11/2019   Procedure: TRANSESOPHAGEAL ECHOCARDIOGRAM (TEE);  Surgeon: Sanda Klein, MD;  Location: [facility];  Service: Cardiovascular;  Laterality: N/A;    ROS: Review of Systems Negative except as stated above  PHYSICAL EXAM: BP 130/84   Pulse 87   Temp 98.3 F (36.8 C) (Oral)   Ht _0  (1.981 m)   Wt (!) 417 lb 9.6 oz (189.4 kg)   SpO2 94%   BMI 48.26 kg/m   Wt Readings from Last 3 Encounters:  07/27/22 (!) 417 lb 9.6 oz (189.4 kg)  03/25/22 (!) 432 lb 9.6 oz (196.2 kg)  07/16/21 (!) 408 lb (185.1 kg)    Physical Exam   General appearance - alert,  well appearing, morbidly obese Caucasian male middle-age and in no distress Mental status - normal mood, behavior, speech, dress, motor activity, and thought processes Neck - supple, no significant adenopathy Chest - clear to auscultation, no wheezes, rales or rhonchi, symmetric air entry Heart - normal rate, regular rhythm, normal S1, S2, no murmurs, rubs, clicks or gallops Extremities - peripheral pulses normal, no pedal edema, no clubbing or cyanosis Skin: Patient has what looks like a hyperpigmented scab on the right nostril.    07/27/2022    4:13 PM 03/25/2022    4:04 PM 11/19/2021    4:44 PM  Depression screen PHQ 2/9  Decreased Interest 0 1 1  Down, Depressed, Hopeless _1 PHQ - 2 Score _2 Altered sleeping _3 Tired, decreased energy _4 Change in appetite _5 Feeling bad or failure about yourself  _6 Trouble concentrating 1 0 1  Moving slowly or fidgety/restless 0 0 0  Suicidal thoughts 0 0 0  PHQ-9 Score _7 Latest Ref Rng & Units 07/16/2021    4:38 PM 07/01/2020    4:33 PM 08/27/2019    3:44 AM  CMP  Glucose 65 - 99 mg/dL 230  256  146   BUN 6 - 24 mg/dL 12  12  <5   Creatinine 0.76 - 1.27 mg/dL 0.81  0.73  0.57   Sodium 134 - 144 mmol/L 139  138  137   Potassium 3.5 - 5.2 mmol/L 4.1  4.3  3.7   Chloride 96 - 106 mmol/L 97  98  100   CO2 20 - 29 mmol/L _8 Calcium 8.7 -  10.2 mg/dL 9.7  10.2  8.7   Total Protein 6.0 - 8.5 g/dL 7.4  7.8  6.2   Total Bilirubin 0.0 - 1.2 mg/dL 0.4  0.5  <0.1   Alkaline Phos 44 - 121 IU/L 100  118  210   AST 0 - 40 IU/L 35  18  31   ALT 0 - 44 IU/L 50  23  31    Lipid Panel     Component Value Date/Time   CHOL 212 (H) 07/16/2021 1638   TRIG 376 (H) 07/16/2021 1638   HDL 38 (L) 07/16/2021 1638   CHOLHDL 5.6 (H) 07/16/2021 1638   CHOLHDL 6.0 07/28/2019 0553   VLDL 26 07/28/2019 0553   LDLCALC 109 (H) 07/16/2021 1638    CBC    Component Value Date/Time   WBC 6.6 07/16/2021 1638   WBC  4.0 08/27/2019 0344   RBC 5.36 07/16/2021 1638   RBC 2.75 (L) 08/27/2019 0344   HGB 16.3 07/16/2021 1638   HCT 47.8 07/16/2021 1638   PLT 180 07/16/2021 1638   MCV 89 07/16/2021 1638   MCH 30.4 07/16/2021 1638   MCH 27.3 08/27/2019 0344   MCHC 34.1 07/16/2021 1638   MCHC 29.1 (L) 08/27/2019 0344   RDW 12.7 07/16/2021 1638   LYMPHSABS 1.5 08/27/2019 0344   MONOABS 0.5 08/27/2019 0344   EOSABS 0.3 08/27/2019 0344   BASOSABS 0.0 08/27/2019 0344    ASSESSMENT AND PLAN: 1. Type 2 diabetes mellitus with morbid obesity ([place]) -At goal.  Commended him on getting his A1c down. Discussed hypoglycemic episodes.  Informed of the importance of still eating at least 3 small meals a day and not skipping meals despite decreased appetite from the [place].  If hypoglycemic episodes start occurring more frequently, advised to decrease NPH insulin to 30 units twice a day and NovoLog to 8 units with meals. - POCT glycosylated hemoglobin (Hb A1C) - CBC - Comprehensive metabolic panel - Lipid panel - Microalbumin / creatinine urine ratio  2. Hypertension associated with diabetes ([place]) Close to goal.  Continue hydralazine 50 mg 3 times a day and Cozaar 50 mg daily.  3. Hyperlipidemia associated with type 2 diabetes mellitus ([facility]) Continue atorvastatin.  Due for lipid profile today.  4. Lesion of skin of nose Advised of how to apply for the [facility] discount.  Once approved he should let me know so that we can refer him to [facility] dermat[facility].   5. Need for shingles vaccine Second Shingrix vaccine given today.  6. Need for immunization against influenza - Flu Vaccine QUAD 55moIM (Fluarix, Fluzone & Alfiuria Quad PF)  7.  Mild depression Patient feels that clinically he is doing much better compared to 1 to 2 years ago.  He declines refill on Cymbalta.  Feels he no longer needs to be on it.   Patient was given the opportunity to ask questions.  Patient verbalized understanding of  the plan and was able to repeat key elements of the plan.   This documentation was completed using [employer] producer  Any transcriptional errors are unintentional.  Orders Placed This Encounter  Procedures   Flu Vaccine QUAD 63moM (Fluarix, Fluzone & Alfiuria Quad PF)   Varicella-zoster vaccine IM   CBC   Comprehensive metabolic panel   Lipid panel   Microalbumin / creatinine urine ratio   POCT glycosylated hemoglobin (Hb A1C)     Requested Prescriptions    No prescriptions requested or ordered in this  encounter    Return in about 4 months (around 11/26/2022).  Karle Plumber, MD, FACP

## 2022-07-28 ENCOUNTER — Other Ambulatory Visit: Payer: Self-pay

## 2022-07-30 ENCOUNTER — Other Ambulatory Visit: Payer: Self-pay

## 2022-09-06 ENCOUNTER — Other Ambulatory Visit: Payer: Self-pay | Admitting: Internal Medicine

## 2022-09-06 DIAGNOSIS — E1142 Type 2 diabetes mellitus with diabetic polyneuropathy: Secondary | ICD-10-CM

## 2022-09-06 DIAGNOSIS — E119 Type 2 diabetes mellitus without complications: Secondary | ICD-10-CM

## 2022-09-07 ENCOUNTER — Other Ambulatory Visit: Payer: Self-pay

## 2022-09-07 MED ORDER — TRUE METRIX BLOOD GLUCOSE TEST VI STRP
ORAL_STRIP | 12 refills | Status: DC
  Filled 2022-09-07: qty 100, 25d supply, fill #0
  Filled 2022-11-06: qty 100, 25d supply, fill #1

## 2022-09-07 MED ORDER — METFORMIN HCL ER 500 MG PO TB24
1000.0000 mg | ORAL_TABLET | Freq: Two times a day (BID) | ORAL | 2 refills | Status: DC
  Filled 2022-09-07: qty 120, 30d supply, fill #0
  Filled 2022-10-14: qty 120, 30d supply, fill #1
  Filled 2022-11-23 – 2022-11-24 (×2): qty 120, 30d supply, fill #2

## 2022-09-08 ENCOUNTER — Other Ambulatory Visit: Payer: Self-pay

## 2022-09-08 MED ORDER — GABAPENTIN 300 MG PO CAPS
ORAL_CAPSULE | ORAL | 6 refills | Status: DC
  Filled 2022-09-08: qty 90, 30d supply, fill #0

## 2022-09-09 ENCOUNTER — Other Ambulatory Visit: Payer: Self-pay

## 2022-09-17 ENCOUNTER — Other Ambulatory Visit: Payer: Self-pay

## 2022-10-15 ENCOUNTER — Other Ambulatory Visit: Payer: Self-pay

## 2022-10-19 ENCOUNTER — Other Ambulatory Visit: Payer: Self-pay

## 2022-11-05 ENCOUNTER — Other Ambulatory Visit: Payer: Self-pay

## 2022-11-08 ENCOUNTER — Other Ambulatory Visit: Payer: Self-pay

## 2022-11-11 ENCOUNTER — Other Ambulatory Visit: Payer: Self-pay

## 2022-11-24 ENCOUNTER — Other Ambulatory Visit: Payer: Self-pay

## 2022-11-26 ENCOUNTER — Encounter: Payer: Self-pay | Admitting: Internal Medicine

## 2022-11-26 ENCOUNTER — Ambulatory Visit: Payer: Self-pay | Attending: Internal Medicine | Admitting: Internal Medicine

## 2022-11-26 ENCOUNTER — Other Ambulatory Visit: Payer: Self-pay

## 2022-11-26 VITALS — BP 148/96 | HR 87 | Temp 98.4°F | Ht 78.0 in | Wt >= 6400 oz

## 2022-11-26 DIAGNOSIS — E785 Hyperlipidemia, unspecified: Secondary | ICD-10-CM

## 2022-11-26 DIAGNOSIS — I152 Hypertension secondary to endocrine disorders: Secondary | ICD-10-CM

## 2022-11-26 DIAGNOSIS — H02536 Eyelid retraction left eye, unspecified eyelid: Secondary | ICD-10-CM

## 2022-11-26 DIAGNOSIS — E1159 Type 2 diabetes mellitus with other circulatory complications: Secondary | ICD-10-CM

## 2022-11-26 DIAGNOSIS — E1169 Type 2 diabetes mellitus with other specified complication: Secondary | ICD-10-CM

## 2022-11-26 DIAGNOSIS — Z794 Long term (current) use of insulin: Secondary | ICD-10-CM

## 2022-11-26 DIAGNOSIS — C44311 Basal cell carcinoma of skin of nose: Secondary | ICD-10-CM

## 2022-11-26 DIAGNOSIS — E1142 Type 2 diabetes mellitus with diabetic polyneuropathy: Secondary | ICD-10-CM

## 2022-11-26 LAB — POCT GLYCOSYLATED HEMOGLOBIN (HGB A1C): HbA1c, POC (controlled diabetic range): 5.8 % (ref 0.0–7.0)

## 2022-11-26 LAB — GLUCOSE, POCT (MANUAL RESULT ENTRY): POC Glucose: 122 mg/dl — AB (ref 70–99)

## 2022-11-26 MED ORDER — INSULIN NPH (HUMAN) (ISOPHANE) 100 UNIT/ML ~~LOC~~ SUSP
30.0000 [IU] | Freq: Two times a day (BID) | SUBCUTANEOUS | 6 refills | Status: DC
  Filled 2022-11-26 – 2022-12-23 (×2): qty 30, 50d supply, fill #0
  Filled 2023-03-07: qty 30, 50d supply, fill #1

## 2022-11-26 MED ORDER — HYDRALAZINE HCL 50 MG PO TABS
75.0000 mg | ORAL_TABLET | Freq: Three times a day (TID) | ORAL | 2 refills | Status: DC
  Filled 2022-11-26: qty 405, 90d supply, fill #0
  Filled 2022-12-23: qty 135, 30d supply, fill #0
  Filled 2023-03-07: qty 135, 30d supply, fill #1
  Filled 2023-05-08 – 2023-05-09 (×2): qty 135, 30d supply, fill #2
  Filled 2023-07-06: qty 135, 30d supply, fill #3
  Filled 2023-09-13 (×2): qty 135, 30d supply, fill #4

## 2022-11-26 NOTE — Patient Instructions (Signed)
Decrease your NPH insulin to 30 units twice a day. Increase hydralazine to 50 mg 1 1/2 tablets 3 times a day.  I have sent an updated prescription to your pharmacy. Try to get in with an ophthalmologist as soon as possible to evaluate the issue you are having with your left eye.

## 2022-11-26 NOTE — Progress Notes (Signed)
Patient ID: Bradley Cox, male    DOB: 08/09/1971  MRN: [id number]  CC: Diabetes (DM f/u. Neoma Laming received flu vax. )   Subjective: Bradley Cox is a 52 y.o. male who presents for chronic ds management His concerns today include:  Patient with history of DM type II with neuropathy, morbid obesity, HTN,  HL, DVT right leg, asthma. MSSA bacteremia, depression.     DM Results for orders placed or performed in visit on 11/26/22  POCT glucose (manual entry)  Result Value Ref Range   POC Glucose 122 (A) 70 - 99 mg/dl  POCT glycosylated hemoglobin (Hb A1C)  Result Value Ref Range   Hemoglobin A1C     HbA1c POC (<> result, manual entry)     HbA1c, POC (prediabetic range)     HbA1c, POC (controlled diabetic range) 5.8 0.0 - 7.0 %  Since last visit, patient reports he is doing well.  Checks blood sugars in the mornings.  Ranges 102-120.  He cooks most of his meals.  Tolerating Ozempic 1 mg once a week with occasional diarrhea.  Compliant with NPH insulin 34 units twice a day metformin 1 g twice a day and NovoLog 12 units with meals. Still gets pain in his feet from neuropathy but not as much.  He discontinued gabapentin.  He feels the pain is less without the gabapentin.  HTN:  Reports compliance with Cozaar 50 mg daily and hydralazine 50 mg 3 times a day.  He limits salt in the foods. -No device to check blood pressure  HL: Taking and tolerating Lipitor  Since last visit he told me that he has seen the dermatologist for the lesion on the right nostril.  He was diagnosed with basal cell cancer.  He needs to have surgical procedure done.  He will be applying for Medicaid.  Complains of intermittent retraction and swelling of the left upper eyelid x 4 months.  No initiating factors.  He just wakes up some mornings with the lead retracted and swollen.  He has to use a warm compress to get the swelling to go down.  Can last for about 3 hours and is painful.  Patient shows me to pictures on  his phone of his eye on last episode.  He has not had an eye exam due to lack of insurance.  Depression: On last visit he told me that he had discontinued the Cymbalta and was in a better place mentally.  Today he states he is still doing well without the medication.  PHQ-9 score is 12 which it was on the last visit.  Patient Active Problem List   Diagnosis Date Noted   Lesion of skin of nose 07/01/2020   Primary insomnia 02/29/2020   Obesity (BMI 30-39.9) 10/20/2019   Drug induced constipation    Uncontrolled type 2 diabetes mellitus with hyperglycemia ([place])    Acute blood loss anemia    Postoperative pain    MSSA bacteremia 07/10/2019   Abscess of right hip 07/09/2019   Compression of right sciatic nerve 07/09/2019   Morbid obesity ([place]) 01/21/2009   GERD 01/21/2009   DYSPHAGIA 01/21/2009   Essential hypertension 01/07/2009   ASTHMA 01/07/2009     Current Outpatient Medications on File Prior to Visit  Medication Sig Dispense Refill   acetaminophen (TYLENOL) 325 MG tablet Take 2 tablets (650 mg total) by mouth every 6 (six) hours as needed for mild pain.     albuterol (PROVENTIL HFA) 108 (90 Base) MCG/ACT  inhaler Inhale 1 puff into the lungs every 6 (six) hours as needed for wheezing or shortness of breath. 6.7 g 1   atorvastatin (LIPITOR) 10 MG tablet TAKE 1 TABLET (10 MG TOTAL) BY MOUTH DAILY. 90 tablet 2   Blood Glucose Monitoring Suppl (TRUE METRIX METER) w/Device KIT Use as directed 1 kit 0   glucose blood (TRUE METRIX BLOOD GLUCOSE TEST) test strip Use as instructed 100 each 12   insulin aspart (NOVOLOG) 100 UNIT/ML FlexPen Inject 12 Units into the skin 3 (three) times daily with meals. 15 mL 11   Insulin Syringe-Needle U-100 31G X 15/64" 1 ML MISC Use as directed 100 each 4   losartan (COZAAR) 50 MG tablet TAKE 1 TABLET (50 MG TOTAL) BY MOUTH DAILY. 90 tablet 2   metFORMIN (GLUCOPHAGE-XR) 500 MG 24 hr tablet Take 2 tablets (1,000 mg total) by mouth 2 (two) times daily. 120  tablet 2   Multiple Vitamin (THERA) TABS Take 2 tablets by mouth daily.     Semaglutide, 1 MG/DOSE, 4 MG/3ML SOPN Inject 1 mg as directed once a week. 3 mL 6   TRUEplus Lancets 28G MISC Use as directed 100 each 4   No current facility-administered medications on file prior to visit.    Allergies  Allergen Reactions   Lisinopril Cough   Toradol [Ketorolac Tromethamine]     SOB, sweating. Tolerates Ibuprofen. Wife reports reaction may have been due to injection.    Social History   Socioeconomic History   Marital status: Single    Spouse name: Not on file   Number of children: Not on file   Years of education: Not on file   Highest education level: Not on file  Occupational History   Not on file  Tobacco Use   Smoking status: Never   Smokeless tobacco: Current    Types: Snuff  Substance and Sexual Activity   Alcohol use: Yes    Comment: rarely   Drug use: Never   Sexual activity: Not on file  Other Topics Concern   Not on file  Social History Narrative   Not on file   Social Determinants of Health   Financial Resource Strain: Not on file  Food Insecurity: Not on file  Transportation Needs: Not on file  Physical Activity: Not on file  Stress: Not on file  Social Connections: Not on file  Intimate Partner Violence: Not on file    Family History  Problem Relation Age of Onset   Diabetes Paternal Grandmother     Past Surgical History:  Procedure Laterality Date   APPLICATION OF WOUND VAC Right 07/17/2019   Procedure: Application Of Wound Vac;  Surgeon: Altamese Metter, MD;  Location: [place];  Service: Orthopedics;  Laterality: Right;   APPLICATION OF WOUND VAC Right 07/20/2019   Procedure: Application Of Wound Vac;  Surgeon: Altamese Emsworth, MD;  Location: [place];  Service: Orthopedics;  Laterality: Right;   APPLICATION OF WOUND VAC Right 07/27/2019   Procedure: Application Of Wound Vac;  Surgeon: Shona Needles, MD;  Location: [place];  Service: Orthopedics;   Laterality: Right;   HERNIA REPAIR     As a baby   I & D EXTREMITY Right 07/27/2019   Procedure: IRRIGATION AND DEBRIDEMENT EXTREMITY Right Leg;  Surgeon: Shona Needles, MD;  Location: [facility];  Service: Orthopedics;  Laterality: Right;   I & D EXTREMITY Right 07/30/2019   Procedure: IRRIGATION AND DEBRIDEMENT EXTREMITY WITH WOUND VAC CHANGE;  Surgeon: Doreatha Martin,  Thomasene Lot, MD;  Location: [place];  Service: Orthopedics;  Laterality: Right;  IRRIGATION AND DEBRIDEMENT EXTREMITY WITH WOUND VAC CHANGE   I & D EXTREMITY Right 08/08/2019   Procedure: IRRIGATION AND DEBRIDEMENT RIGHT EXTREMITY;  Surgeon: Shona Needles, MD;  Location: [facility];  Service: Orthopedics;  Laterality: Right;   INCISION AND DRAINAGE ABSCESS Right 07/09/2019   Procedure: INCISION AND DRAINAGE ABSCESS RIGHT HIP/PELVIS;  Surgeon: Shona Needles, MD;  Location: [facility];  Service: Orthopedics;  Laterality: Right;   INCISION AND DRAINAGE HIP Right 07/17/2019   Procedure: IRRIGATION AND DEBRIDEMENT HIP;  Surgeon: Altamese Nederland, MD;  Location: [facility];  Service: Orthopedics;  Laterality: Right;   INCISION AND DRAINAGE HIP Right 07/20/2019   Procedure: Irrigation And Debridement Hip;  Surgeon: Altamese Belt, MD;  Location: [facility];  Service: Orthopedics;  Laterality: Right;   TEE WITHOUT CARDIOVERSION N/A 07/11/2019   Procedure: TRANSESOPHAGEAL ECHOCARDIOGRAM (TEE);  Surgeon: Sanda Klein, MD;  Location: [facility];  Service: Cardiovascular;  Laterality: N/A;    ROS: Review of Systems Negative except as stated above  PHYSICAL EXAM: BP (!) 148/96 (BP Location: Left Arm, Patient Position: Sitting, Cuff Size: Large)   Pulse 87   Temp 98.4 F (36.9 C) (Oral)   Ht '6\' 6"'$  (1.981 m)   Wt (!) 418 lb (189.6 kg)   SpO2 93%   BMI 48.30 kg/m   Wt Readings from Last 3 Encounters:  11/26/22 (!) 418 lb (189.6 kg)  07/27/22 (!) 417 lb 9.6 oz (189.4 kg)  03/25/22 (!) 432 lb 9.6 oz (196.2 kg)    Physical Exam   General appearance - alert, well  appearing, morbidly obese middle-age Caucasian male and in no distress Mental status - normal mood, behavior, speech, dress, motor activity, and thought processes Neck - supple, no significant adenopathy Chest - clear to auscultation, no wheezes, rales or rhonchi, symmetric air entry Heart - normal rate, regular rhythm, normal S1, S2, no murmurs, rubs, clicks or gallops Extremities - peripheral pulses normal, no pedal edema, no clubbing or cyanosis Diabetic Foot Exam - Simple   Simple Foot Form Diabetic Foot exam was performed with the following findings: Yes 11/26/2022  5:56 PM  Visual Inspection No deformities, no ulcerations, no other skin breakdown bilaterally: Yes Sensation Testing See comments: Yes Pulse Check Posterior Tibialis and Dorsalis pulse intact bilaterally: Yes Comments Decree sensation on the plantar surface of both feet on leap exam.        11/26/2022    4:05 PM 07/27/2022    4:13 PM 03/25/2022    4:04 PM  Depression screen PHQ 2/9  Decreased Interest 1 0 1  Down, Depressed, Hopeless '1 1 1  '$ PHQ - 2 Score '2 1 2  '$ Altered sleeping '3 3 3  '$ Tired, decreased energy '3 3 3  '$ Change in appetite '2 3 1  '$ Feeling bad or failure about yourself  '1 1 1  '$ Trouble concentrating 1 1 0  Moving slowly or fidgety/restless 0 0 0  Suicidal thoughts 0 0 0  PHQ-9 Score '12 12 10       '$ Latest Ref Rng & Units 07/16/2021    4:38 PM 07/01/2020    4:33 PM 08/27/2019    3:44 AM  CMP  Glucose 65 - 99 mg/dL 230  256  146   BUN 6 - 24 mg/dL 12  12  <5   Creatinine 0.76 - 1.27 mg/dL 0.81  0.73  0.57   Sodium 134 - 144 mmol/L  139  138  137   Potassium 3.5 - 5.2 mmol/L 4.1  4.3  3.7   Chloride 96 - 106 mmol/L 97  98  100   CO2 20 - 29 mmol/L '26  26  28   '$ Calcium 8.7 - 10.2 mg/dL 9.7  10.2  8.7   Total Protein 6.0 - 8.5 g/dL 7.4  7.8  6.2   Total Bilirubin 0.0 - 1.2 mg/dL 0.4  0.5  <0.1   Alkaline Phos 44 - 121 IU/L 100  118  210   AST 0 - 40 IU/L 35  18  31   ALT 0 - 44 IU/L 50  23  31     Lipid Panel     Component Value Date/Time   CHOL 212 (H) 07/16/2021 1638   TRIG 376 (H) 07/16/2021 1638   HDL 38 (L) 07/16/2021 1638   CHOLHDL 5.6 (H) 07/16/2021 1638   CHOLHDL 6.0 07/28/2019 0553   VLDL 26 07/28/2019 0553   LDLCALC 109 (H) 07/16/2021 1638    CBC    Component Value Date/Time   WBC 6.6 07/16/2021 1638   WBC 4.0 08/27/2019 0344   RBC 5.36 07/16/2021 1638   RBC 2.75 (L) 08/27/2019 0344   HGB 16.3 07/16/2021 1638   HCT 47.8 07/16/2021 1638   PLT 180 07/16/2021 1638   MCV 89 07/16/2021 1638   MCH 30.4 07/16/2021 1638   MCH 27.3 08/27/2019 0344   MCHC 34.1 07/16/2021 1638   MCHC 29.1 (L) 08/27/2019 0344   RDW 12.7 07/16/2021 1638   LYMPHSABS 1.5 08/27/2019 0344   MONOABS 0.5 08/27/2019 0344   EOSABS 0.3 08/27/2019 0344   BASOSABS 0.0 08/27/2019 0344    ASSESSMENT AND PLAN: 1. Type 2 diabetes mellitus with diabetic polyneuropathy, with long-term current use of insulin (HCC) Well-controlled. Continue Ozempic 1 mg once a week.  Decrease the NPH insulin to 30 units twice a day.  Continue NovoLog 12 units with meals and metformin 1 g twice a day. - POCT glucose (manual entry) - POCT glycosylated hemoglobin (Hb A1C) - CBC - Lipid panel - Comprehensive metabolic panel - Microalbumin / creatinine urine ratio - insulin NPH Human (HUMULIN N) 100 UNIT/ML injection; Inject 0.3 mLs (30 Units total) into the skin 2 (two) times daily at 8 am and 10 pm.  Dispense: 30 mL; Refill: 6  2. Hypertension associated with diabetes ([place]) Not at goal.  Increase hydralazine to 75 mg 3 times a day.  Continue Cozaar 50 mg daily. - hydrALAZINE (APRESOLINE) 50 MG tablet; Take 1.5 tablets (75 mg total) by mouth 3 (three) times daily.  Dispense: 405 tablet; Refill: 2  3. Morbid obesity ([place]) Commended him on cooking his meals.  Discussed on encourage healthy eating habits.  4. Basal cell carcinoma (BCC) of right side of nose Advised to apply for Medicaid ASAP and get back in with  the dermatologist to have this taken care of  5. Hyperlipidemia associated with type 2 diabetes mellitus (HCC) Continue atorvastatin.  6. Lid retraction of left eye Encouraged him to try to get in with an ophthalmologist as soon as possible.     Patient was given the opportunity to ask questions.  Patient verbalized understanding of the plan and was able to repeat key elements of the plan.   This documentation was completed using Radio producer.  Any transcriptional errors are unintentional.  Orders Placed This Encounter  Procedures   CBC   Lipid panel   Comprehensive metabolic  panel   Microalbumin / creatinine urine ratio   POCT glucose (manual entry)   POCT glycosylated hemoglobin (Hb A1C)     Requested Prescriptions   Signed Prescriptions Disp Refills   hydrALAZINE (APRESOLINE) 50 MG tablet 405 tablet 2    Sig: Take 1.5 tablets (75 mg total) by mouth 3 (three) times daily.   insulin NPH Human (HUMULIN N) 100 UNIT/ML injection 30 mL 6    Sig: Inject 0.3 mLs (30 Units total) into the skin 2 (two) times daily at 8 am and 10 pm.    Return in about 4 months (around 03/27/2023).  Karle Plumber, MD, FACP

## 2022-11-27 ENCOUNTER — Other Ambulatory Visit: Payer: Self-pay | Admitting: Internal Medicine

## 2022-11-27 DIAGNOSIS — E1169 Type 2 diabetes mellitus with other specified complication: Secondary | ICD-10-CM

## 2022-11-27 MED ORDER — ATORVASTATIN CALCIUM 20 MG PO TABS
20.0000 mg | ORAL_TABLET | Freq: Every day | ORAL | 1 refills | Status: DC
  Filled 2022-11-27: qty 90, 90d supply, fill #0
  Filled 2022-12-23: qty 30, 30d supply, fill #0
  Filled 2023-07-06: qty 30, 30d supply, fill #1
  Filled 2023-09-13 (×2): qty 30, 30d supply, fill #2

## 2022-11-29 ENCOUNTER — Other Ambulatory Visit: Payer: Self-pay

## 2022-11-29 LAB — COMPREHENSIVE METABOLIC PANEL
ALT: 22 IU/L (ref 0–44)
AST: 18 IU/L (ref 0–40)
Albumin/Globulin Ratio: 1.5 (ref 1.2–2.2)
Albumin: 4.5 g/dL (ref 3.8–4.9)
Alkaline Phosphatase: 91 IU/L (ref 44–121)
BUN/Creatinine Ratio: 13 (ref 9–20)
BUN: 12 mg/dL (ref 6–24)
Bilirubin Total: 0.4 mg/dL (ref 0.0–1.2)
CO2: 25 mmol/L (ref 20–29)
Calcium: 9.8 mg/dL (ref 8.7–10.2)
Chloride: 101 mmol/L (ref 96–106)
Creatinine, Ser: 0.91 mg/dL (ref 0.76–1.27)
Globulin, Total: 3 g/dL (ref 1.5–4.5)
Glucose: 110 mg/dL — ABNORMAL HIGH (ref 70–99)
Potassium: 4.5 mmol/L (ref 3.5–5.2)
Sodium: 141 mmol/L (ref 134–144)
Total Protein: 7.5 g/dL (ref 6.0–8.5)
eGFR: 101 mL/min/{1.73_m2} (ref >59)

## 2022-11-29 LAB — CBC
Hematocrit: 46.5 % (ref 37.5–51.0)
Hemoglobin: 16 g/dL (ref 13.0–17.7)
MCH: 30.4 pg (ref 26.6–33.0)
MCHC: 34.4 g/dL (ref 31.5–35.7)
MCV: 88 fL (ref 79–97)
Platelets: 204 10*3/uL (ref 150–450)
RBC: 5.27 x10E6/uL (ref 4.14–5.80)
RDW: 12.9 % (ref 11.6–15.4)
WBC: 7.7 10*3/uL (ref 3.4–10.8)

## 2022-11-29 LAB — LIPID PANEL
Chol/HDL Ratio: 5 ratio (ref 0.0–5.0)
Cholesterol, Total: 176 mg/dL (ref 100–199)
HDL: 35 mg/dL — ABNORMAL LOW (ref >39)
LDL Chol Calc (NIH): 104 mg/dL — ABNORMAL HIGH (ref 0–99)
Triglycerides: 215 mg/dL — ABNORMAL HIGH (ref 0–149)
VLDL Cholesterol Cal: 37 mg/dL (ref 5–40)

## 2022-11-29 LAB — MICROALBUMIN / CREATININE URINE RATIO
Creatinine, Urine: 149.5 mg/dL
Microalb/Creat Ratio: 4 mg/g creat (ref 0–29)
Microalbumin, Urine: 6 ug/mL

## 2022-12-06 ENCOUNTER — Other Ambulatory Visit: Payer: Self-pay

## 2022-12-10 ENCOUNTER — Other Ambulatory Visit: Payer: Self-pay

## 2022-12-23 ENCOUNTER — Other Ambulatory Visit: Payer: Self-pay

## 2022-12-23 ENCOUNTER — Other Ambulatory Visit: Payer: Self-pay | Admitting: Internal Medicine

## 2022-12-23 MED ORDER — METFORMIN HCL ER 500 MG PO TB24
1000.0000 mg | ORAL_TABLET | Freq: Two times a day (BID) | ORAL | 1 refills | Status: DC
  Filled 2022-12-23: qty 360, 90d supply, fill #0
  Filled 2023-04-05: qty 360, 90d supply, fill #1

## 2022-12-23 NOTE — Telephone Encounter (Signed)
Requested Prescriptions  Pending Prescriptions Disp Refills   metFORMIN (GLUCOPHAGE-XR) 500 MG 24 hr tablet 360 tablet 1    Sig: Take 2 tablets (1,000 mg total) by mouth 2 (two) times daily.     Endocrinology:  Diabetes - Biguanides Failed - 12/23/2022 11:25 AM      Failed - B12 Level in normal range and within 720 days    No results found for: "VITAMINB12"       Failed - CBC within normal limits and completed in the last 12 months    WBC  Date Value Ref Range Status  11/26/2022 7.7 3.4 - 10.8 x10E3/uL Final  08/27/2019 4.0 4.0 - 10.5 K/uL Final   RBC  Date Value Ref Range Status  11/26/2022 5.27 4.14 - 5.80 x10E6/uL Final  08/27/2019 2.75 (L) 4.22 - 5.81 MIL/uL Final   Hemoglobin  Date Value Ref Range Status  11/26/2022 16.0 13.0 - 17.7 g/dL Final   Hematocrit  Date Value Ref Range Status  11/26/2022 46.5 37.5 - 51.0 % Final   MCHC  Date Value Ref Range Status  11/26/2022 34.4 31.5 - 35.7 g/dL Final  08/27/2019 29.1 (L) 30.0 - 36.0 g/dL Final   [facility]  Date Value Ref Range Status  11/26/2022 30.4 26.6 - 33.0 pg Final  08/27/2019 27.3 26.0 - 34.0 pg Final   MCV  Date Value Ref Range Status  11/26/2022 88 79 - 97 fL Final   No results found for: "PLTCOUNTKUC", "LABPLAT", "POCPLA" RDW  Date Value Ref Range Status  11/26/2022 12.9 11.6 - 15.4 % Final         Passed - Cr in normal range and within 360 days    Creatinine, Ser  Date Value Ref Range Status  11/26/2022 0.91 0.76 - 1.27 mg/dL Final         Passed - HBA1C is between 0 and 7.9 and within 180 days    HbA1c, POC (controlled diabetic range)  Date Value Ref Range Status  11/26/2022 5.8 0.0 - 7.0 % Final         Passed - eGFR in normal range and within 360 days    GFR calc Af Amer  Date Value Ref Range Status  07/01/2020 125 >59 mL/min/1.73 Final    Comment:    **[facility] currently reports eGFR in compliance with the current**   recommendations of the [employer]. [facility] will   update  reporting as new guidelines are published from the NKF-ASN   Task force.    GFR calc non Af Amer  Date Value Ref Range Status  07/01/2020 108 >59 mL/min/1.73 Final   eGFR  Date Value Ref Range Status  11/26/2022 101 >59 mL/min/1.73 Final         Passed - Valid encounter within last 6 months    Recent Outpatient Visits           3 weeks ago Type 2 diabetes mellitus with diabetic polyneuropathy, with long-term current use of insulin ([place])   [place] Karle Plumber B, MD   4 months ago Type 2 diabetes mellitus with morbid obesity [facility])   [place] Karle Plumber B, MD   9 months ago Type 2 diabetes mellitus with morbid obesity [facility])   [place] Karle Plumber B, MD   12 months ago Type 2 diabetes mellitus with peripheral neuropathy [facility])   [place]  Tresa Endo, RPH-CPP   1 year ago Type 2 diabetes mellitus with peripheral neuropathy [facility])   [place], RPH-CPP       Future Appointments             In 3 months Wynetta Emery, Dalbert Batman, MD [place]

## 2022-12-24 ENCOUNTER — Other Ambulatory Visit: Payer: Self-pay

## 2023-02-10 ENCOUNTER — Other Ambulatory Visit: Payer: Self-pay

## 2023-02-10 ENCOUNTER — Other Ambulatory Visit: Payer: Self-pay | Admitting: Internal Medicine

## 2023-02-10 DIAGNOSIS — E1169 Type 2 diabetes mellitus with other specified complication: Secondary | ICD-10-CM

## 2023-02-10 MED ORDER — OZEMPIC (1 MG/DOSE) 4 MG/3ML ~~LOC~~ SOPN
1.0000 mg | PEN_INJECTOR | SUBCUTANEOUS | 2 refills | Status: DC
  Filled 2023-02-10: qty 9, 84d supply, fill #0

## 2023-02-14 ENCOUNTER — Other Ambulatory Visit: Payer: Self-pay

## 2023-03-07 ENCOUNTER — Other Ambulatory Visit: Payer: Self-pay

## 2023-04-01 ENCOUNTER — Ambulatory Visit: Payer: Self-pay | Attending: Internal Medicine | Admitting: Internal Medicine

## 2023-04-01 ENCOUNTER — Other Ambulatory Visit: Payer: Self-pay

## 2023-04-01 ENCOUNTER — Encounter: Payer: Self-pay | Admitting: Internal Medicine

## 2023-04-01 DIAGNOSIS — E1169 Type 2 diabetes mellitus with other specified complication: Secondary | ICD-10-CM

## 2023-04-01 DIAGNOSIS — E1159 Type 2 diabetes mellitus with other circulatory complications: Secondary | ICD-10-CM

## 2023-04-01 DIAGNOSIS — E782 Mixed hyperlipidemia: Secondary | ICD-10-CM

## 2023-04-01 DIAGNOSIS — Z7984 Long term (current) use of oral hypoglycemic drugs: Secondary | ICD-10-CM

## 2023-04-01 DIAGNOSIS — F320 Major depressive disorder, single episode, mild: Secondary | ICD-10-CM

## 2023-04-01 DIAGNOSIS — I152 Hypertension secondary to endocrine disorders: Secondary | ICD-10-CM

## 2023-04-01 LAB — POCT GLYCOSYLATED HEMOGLOBIN (HGB A1C): HbA1c, POC (controlled diabetic range): 6 % (ref 0.0–7.0)

## 2023-04-01 LAB — GLUCOSE, POCT (MANUAL RESULT ENTRY): POC Glucose: 90 mg/dl (ref 70–99)

## 2023-04-01 MED ORDER — INSULIN ASPART 100 UNIT/ML FLEXPEN
5.0000 [IU] | PEN_INJECTOR | Freq: Two times a day (BID) | SUBCUTANEOUS | 11 refills | Status: DC
Start: 2023-04-01 — End: 2023-10-11
  Filled 2023-04-01: qty 15, 140d supply, fill #0

## 2023-04-01 MED ORDER — SEMAGLUTIDE (2 MG/DOSE) 8 MG/3ML ~~LOC~~ SOPN
2.0000 mg | PEN_INJECTOR | SUBCUTANEOUS | 6 refills | Status: DC
  Filled 2023-04-01: qty 3, fill #0
  Filled 2023-04-05: qty 3, 28d supply, fill #0
  Filled 2023-05-27 (×2): qty 3, 28d supply, fill #1
  Filled 2023-07-06: qty 3, 28d supply, fill #2
  Filled 2023-08-05: qty 12, 112d supply, fill #3

## 2023-04-01 MED ORDER — INSULIN NPH (HUMAN) (ISOPHANE) 100 UNIT/ML ~~LOC~~ SUSP
25.0000 [IU] | Freq: Two times a day (BID) | SUBCUTANEOUS | 6 refills | Status: DC
Start: 2023-04-01 — End: 2023-10-11
  Filled 2023-04-01: qty 30, 60d supply, fill #0
  Filled 2023-05-08: qty 20, 40d supply, fill #0
  Filled 2023-06-17: qty 20, 40d supply, fill #1
  Filled 2023-07-08: qty 20, 40d supply, fill #2
  Filled 2023-09-13 (×2): qty 20, 40d supply, fill #3

## 2023-04-01 NOTE — Patient Instructions (Signed)
Decrease NovoLog to 5 units with breakfast and dinner. Increase Ozempic to 2 mg once a week.  After 1 to 2 weeks on the higher dose, if you find that your blood sugars are staying good, you can stop the NovoLog.  Please remember to apply for Medicaid which I think you will qualify for.

## 2023-04-01 NOTE — Progress Notes (Signed)
Gaining weight while on ozempic

## 2023-04-01 NOTE — Progress Notes (Signed)
Patient ID: Bradley Cox, male    DOB: [birth year]  MRN: [id number]  CC: Hypertension and Diabetes   Subjective: Bradley Cox is a 52 y.o. male who presents for chronic ds management His concerns today include:  Patient with history of DM type II with neuropathy, morbid obesity, HTN,  HL, DVT right leg, asthma. MSSA bacteremia, depression, basal cell CA.     DM Results for orders placed or performed in visit on 04/01/23  POCT glycosylated hemoglobin (Hb A1C)  Result Value Ref Range   Hemoglobin A1C     HbA1c POC (<> result, manual entry)     HbA1c, POC (prediabetic range)     HbA1c, POC (controlled diabetic range) 6.0 0.0 - 7.0 %  POCT glucose (manual entry)  Result Value Ref Range   POC Glucose 90 70 - 99 mg/dl  Patient currently on Ozempic 1 mg weekly, metformin 1 g twice a day, NPH insulin 25 units twice a day and NovoLog insulin 10 units with breakfast and dinner (supposed to be 3 times a day with meals) Checks blood sugars once a day in the mornings.  Range has been around the 120s.  He has gained some weight despite decreased appetite and smaller portion sizes.  He denies any nausea, vomiting or abdominal pain with the Ozempic.  He plays paintball on the weekends to help get himself moving.   HTN:   Reports compliance with Cozaar 50 mg daily and hydralazine 75 mg 3 times a day.  He limits salt in the foods.  No chest pains.  Shortness of breath with exertion.  Some trace edema in the legs.  HL: Taking and tolerating Lipitor   His PHQ-9 is positive.  He was on Cymbalta in the past but discontinued taking it.  Reports that he is still in a good place mentally.  He has a granddaughter who is 22 months old.  Sometimes worries about his son to his her father but nothing major.  Patient Active Problem List   Diagnosis Date Noted   Lesion of skin of nose 07/01/2020   Primary insomnia 02/29/2020   Obesity (BMI 30-39.9) 10/20/2019   Drug induced constipation    Uncontrolled  type 2 diabetes mellitus with hyperglycemia (HCC)    Acute blood loss anemia    Postoperative pain    Abscess of right hip 07/09/2019   Compression of right sciatic nerve 07/09/2019   Right elbow pain 06/17/2015   Cavitary lesion of lung 06/16/2015   Patent foramen ovale 06/13/2015   Sepsis (HCC) 06/11/2015   Bacteremia due to Staphylococcus aureus 06/11/2015   Hyponatremia 06/11/2015   Acute respiratory failure with hypoxia (HCC) 06/11/2015   Asthma 06/11/2015   Pleuritic chest pain 06/11/2015   Pneumonia due to infectious organism 06/11/2015   Type 2 diabetes mellitus with hyperglycemia (HCC) 06/11/2015   Morbid obesity (HCC) 01/21/2009   GERD 01/21/2009   DYSPHAGIA 01/21/2009   HTN (hypertension) 01/07/2009   ASTHMA 01/07/2009     Current Outpatient Medications on File Prior to Visit  Medication Sig Dispense Refill   acetaminophen (TYLENOL) 325 MG tablet Take 2 tablets (650 mg total) by mouth every 6 (six) hours as needed for mild pain.     albuterol (PROVENTIL HFA) 108 (90 Base) MCG/ACT inhaler Inhale 1 puff into the lungs every 6 (six) hours as needed for wheezing or shortness of breath. 6.7 g 1   atorvastatin (LIPITOR) 20 MG tablet Take 1 tablet (20 mg total) by mouth  daily. 90 tablet 1   Blood Glucose Monitoring Suppl (TRUE METRIX METER) w/Device KIT Use as directed 1 kit 0   glucose blood (TRUE METRIX BLOOD GLUCOSE TEST) test strip Use as instructed 100 each 12   hydrALAZINE (APRESOLINE) 50 MG tablet Take 1.5 tablets (75 mg total) by mouth 3 (three) times daily. 405 tablet 2   insulin aspart (NOVOLOG) 100 UNIT/ML FlexPen Inject 12 Units into the skin 3 (three) times daily with meals. 15 mL 11   insulin NPH Human (HUMULIN N) 100 UNIT/ML injection Inject 0.3 mLs (30 Units total) into the skin 2 (two) times daily at 8 am and 10 pm. 30 mL 6   Insulin Syringe-Needle U-100 31G X 15/64" 1 ML MISC Use as directed 100 each 4   losartan (COZAAR) 50 MG tablet TAKE 1 TABLET (50 MG  TOTAL) BY MOUTH DAILY. 90 tablet 2   metFORMIN (GLUCOPHAGE-XR) 500 MG 24 hr tablet Take 2 tablets (1,000 mg total) by mouth 2 (two) times daily. 360 tablet 1   Multiple Vitamin (THERA) TABS Take 2 tablets by mouth daily.     Semaglutide, 1 MG/DOSE, (OZEMPIC, 1 MG/DOSE,) 4 MG/3ML SOPN Inject 1 mg as directed once a week. 9 mL 2   TRUEplus Lancets 28G MISC Use as directed 100 each 4   No current facility-administered medications on file prior to visit.    Allergies  Allergen Reactions   Lisinopril Cough   Toradol [Ketorolac Tromethamine]     SOB, sweating. Tolerates Ibuprofen. Wife reports reaction may have been due to injection.    Social History   Socioeconomic History   Marital status: Single    Spouse name: Not on file   Number of children: Not on file   Years of education: Not on file   Highest education level: Not on file  Occupational History   Not on file  Tobacco Use   Smoking status: Never   Smokeless tobacco: Current    Types: Snuff  Vaping Use   Vaping Use: Never used  Substance and Sexual Activity   Alcohol use: Yes    Comment: rarely   Drug use: Never   Sexual activity: Not on file  Other Topics Concern   Not on file  Social History Narrative   Not on file   Social Determinants of Health   Financial Resource Strain: Not on file  Food Insecurity: Not on file  Transportation Needs: Not on file  Physical Activity: Not on file  Stress: Not on file  Social Connections: Not on file  Intimate Partner Violence: Not on file    Family History  Problem Relation Age of Onset   Diabetes Paternal Grandmother     Past Surgical History:  Procedure Laterality Date   APPLICATION OF WOUND VAC Right 07/17/2019   Procedure: Application Of Wound Vac;  Surgeon: Myrene Galas, MD;  Location: [facility] OR;  Service: Orthopedics;  Laterality: Right;   APPLICATION OF WOUND VAC Right 07/20/2019   Procedure: Application Of Wound Vac;  Surgeon: Myrene Galas, MD;  Location: [facility]  OR;  Service: Orthopedics;  Laterality: Right;   APPLICATION OF WOUND VAC Right 07/27/2019   Procedure: Application Of Wound Vac;  Surgeon: Roby Lofts, MD;  Location: [facility] OR;  Service: Orthopedics;  Laterality: Right;   HERNIA REPAIR     As a baby   I & D EXTREMITY Right 07/27/2019   Procedure: IRRIGATION AND DEBRIDEMENT EXTREMITY Right Leg;  Surgeon: Roby Lofts, MD;  Location: [facility]  OR;  Service: Orthopedics;  Laterality: Right;   I & D EXTREMITY Right 07/30/2019   Procedure: IRRIGATION AND DEBRIDEMENT EXTREMITY WITH WOUND VAC CHANGE;  Surgeon: Roby Lofts, MD;  Location: [facility] OR;  Service: Orthopedics;  Laterality: Right;  IRRIGATION AND DEBRIDEMENT EXTREMITY WITH WOUND VAC CHANGE   I & D EXTREMITY Right 08/08/2019   Procedure: IRRIGATION AND DEBRIDEMENT RIGHT EXTREMITY;  Surgeon: Roby Lofts, MD;  Location: [facility] OR;  Service: Orthopedics;  Laterality: Right;   INCISION AND DRAINAGE ABSCESS Right 07/09/2019   Procedure: INCISION AND DRAINAGE ABSCESS RIGHT HIP/PELVIS;  Surgeon: Roby Lofts, MD;  Location: [facility] OR;  Service: Orthopedics;  Laterality: Right;   INCISION AND DRAINAGE HIP Right 07/17/2019   Procedure: IRRIGATION AND DEBRIDEMENT HIP;  Surgeon: Myrene Galas, MD;  Location: [facility] OR;  Service: Orthopedics;  Laterality: Right;   INCISION AND DRAINAGE HIP Right 07/20/2019   Procedure: Irrigation And Debridement Hip;  Surgeon: Myrene Galas, MD;  Location: [facility] OR;  Service: Orthopedics;  Laterality: Right;   TEE WITHOUT CARDIOVERSION N/A 07/11/2019   Procedure: TRANSESOPHAGEAL ECHOCARDIOGRAM (TEE);  Surgeon: Thurmon Fair, MD;  Location: [facility] ENDOSCOPY;  Service: Cardiovascular;  Laterality: N/A;    ROS: Review of Systems Negative except as stated above  PHYSICAL EXAM: BP 134/83 (BP Location: Left Arm, Patient Position: Sitting, Cuff Size: Large)   Pulse 86   Ht 6\' 6"  (1.981 m)   Wt (!) 423 lb 6.4 oz (192.1 kg)   SpO2 95%   BMI 48.93 kg/m   Wt Readings from Last 3 Encounters:   04/01/23 (!) 423 lb 6.4 oz (192.1 kg)  11/26/22 (!) 418 lb (189.6 kg)  07/27/22 (!) 417 lb 9.6 oz (189.4 kg)    Physical Exam   General appearance - alert, well appearing, morbidly obese middle-age Caucasian male and in no distress Mental status - normal mood, behavior, speech, dress, motor activity, and thought processes Chest - clear to auscultation, no wheezes, rales or rhonchi, symmetric air entry Heart - normal rate, regular rhythm, normal S1, S2, no murmurs, rubs, clicks or gallops Extremities -trace bilateral lower extremity edema.    04/01/2023    3:45 PM 11/26/2022    4:05 PM 07/27/2022    4:13 PM  Depression screen PHQ 2/9  Decreased Interest 1 1 0  Down, Depressed, Hopeless 1 1 1   PHQ - 2 Score 2 2 1   Altered sleeping 3 3 3   Tired, decreased energy 3 3 3   Change in appetite 1 2 3   Feeling bad or failure about yourself  1 1 1   Trouble concentrating 2 1 1   Moving slowly or fidgety/restless 1 0 0  Suicidal thoughts 0 0 0  PHQ-9 Score 13 12 12        Latest Ref Rng & Units 11/26/2022    4:54 PM 07/16/2021    4:38 PM 07/01/2020    4:33 PM  CMP  Glucose 70 - 99 mg/dL 161  096  045   BUN 6 - 24 mg/dL 12  12  12    Creatinine 0.76 - 1.27 mg/dL 4.09  8.11  9.14   Sodium 134 - 144 mmol/L 141  139  138   Potassium 3.5 - 5.2 mmol/L 4.5  4.1  4.3   Chloride 96 - 106 mmol/L 101  97  98   CO2 20 - 29 mmol/L 25  26  26    Calcium 8.7 - 10.2 mg/dL 9.8  9.7  78.2   Total Protein 6.0 -  8.5 g/dL 7.5  7.4  7.8   Total Bilirubin 0.0 - 1.2 mg/dL 0.4  0.4  0.5   Alkaline Phos 44 - 121 IU/L 91  100  118   AST 0 - 40 IU/L 18  35  18   ALT 0 - 44 IU/L 22  50  23    Lipid Panel     Component Value Date/Time   CHOL 176 11/26/2022 1654   TRIG 215 (H) 11/26/2022 1654   HDL 35 (L) 11/26/2022 1654   CHOLHDL 5.0 11/26/2022 1654   CHOLHDL 6.0 07/28/2019 0553   VLDL 26 07/28/2019 0553   LDLCALC 104 (H) 11/26/2022 1654    CBC    Component Value Date/Time   WBC 7.7 11/26/2022 1654    WBC 4.0 08/27/2019 0344   RBC 5.27 11/26/2022 1654   RBC 2.75 (L) 08/27/2019 0344   HGB 16.0 11/26/2022 1654   HCT 46.5 11/26/2022 1654   PLT 204 11/26/2022 1654   MCV 88 11/26/2022 1654   MCH 30.4 11/26/2022 1654   MCH 27.3 08/27/2019 0344   MCHC 34.4 11/26/2022 1654   MCHC 29.1 (L) 08/27/2019 0344   RDW 12.9 11/26/2022 1654   LYMPHSABS 1.5 08/27/2019 0344   MONOABS 0.5 08/27/2019 0344   EOSABS 0.3 08/27/2019 0344   BASOSABS 0.0 08/27/2019 0344    ASSESSMENT AND PLAN: 1. Type 2 diabetes mellitus with morbid obesity (HCC) A1c is at goal. Encouraged him to continue trying to eat healthy and to move is much as he can. We discussed increasing the dose of the Ozempic from 1 mg once a week to 2 mg once a week.  Decrease NovoLog to 5 units with breakfast and dinner.  Continue metformin and NPH insulin 25 units twice a day. Went over with him again possible side effects of Ozempic including nausea, vomiting, diarrhea, constipation, abdominal pain which could be indicative of pancreatitis or bowel blockage.  He will let me know if he experiences any side effects. We will try to gradually get him off NovoLog insulin - POCT glycosylated hemoglobin (Hb A1C) - POCT glucose (manual entry) - Semaglutide, 2 MG/DOSE, 8 MG/3ML SOPN; Inject 2 mg as directed once a week.  Dispense: 3 mL; Refill: 6 - insulin aspart (NOVOLOG) 100 UNIT/ML FlexPen; Inject 5 Units into the skin 2 (two) times daily with breakfast and dinner.  Dispense: 15 mL; Refill: 11 - insulin NPH Human (HUMULIN N) 100 UNIT/ML injection; Inject 0.25 mLs (25 Units total) into the skin 2 (two) times daily at 8 am and 10 pm.  Dispense: 30 mL; Refill: 6  2. Mixed hyperlipidemia Continue atorvastatin  3. Hypertension associated with diabetes (HCC) Close to goal.  Continue Cozaar 50 mg daily and hydralazine 75 mg 3 times a day.  4. Mild major depression (HCC) Patient reports this is not a major issue for him at this time and he does not  feel that he needs to be back on the Cymbalta.  Patient advised to apply for Medicaid.  Once approved we can refer him for eye exam and to the dermatologist to follow-up on basal cell on the nose.     Patient was given the opportunity to ask questions.  Patient verbalized understanding of the plan and was able to repeat key elements of the plan.   This documentation was completed using Paediatric nurse.  Any transcriptional errors are unintentional.  Orders Placed This Encounter  Procedures   POCT glycosylated hemoglobin (Hb A1C)  POCT glucose (manual entry)     Requested Prescriptions    No prescriptions requested or ordered in this encounter    No follow-ups on file.  Jonah Blue, MD, FACP

## 2023-04-05 ENCOUNTER — Other Ambulatory Visit: Payer: Self-pay

## 2023-04-08 ENCOUNTER — Other Ambulatory Visit: Payer: Self-pay

## 2023-04-14 ENCOUNTER — Other Ambulatory Visit: Payer: Self-pay

## 2023-04-15 ENCOUNTER — Other Ambulatory Visit: Payer: Self-pay

## 2023-04-28 ENCOUNTER — Other Ambulatory Visit: Payer: Self-pay

## 2023-05-09 ENCOUNTER — Other Ambulatory Visit: Payer: Self-pay

## 2023-05-13 ENCOUNTER — Other Ambulatory Visit: Payer: Self-pay

## 2023-05-27 ENCOUNTER — Other Ambulatory Visit: Payer: Self-pay

## 2023-06-01 ENCOUNTER — Other Ambulatory Visit: Payer: Self-pay

## 2023-06-17 ENCOUNTER — Other Ambulatory Visit: Payer: Self-pay

## 2023-06-17 ENCOUNTER — Ambulatory Visit: Payer: Self-pay | Attending: Internal Medicine | Admitting: Internal Medicine

## 2023-06-17 ENCOUNTER — Encounter: Payer: Self-pay | Admitting: Internal Medicine

## 2023-06-17 VITALS — BP 126/85 | HR 94 | Wt >= 6400 oz

## 2023-06-17 DIAGNOSIS — Z6841 Body Mass Index (BMI) 40.0 and over, adult: Secondary | ICD-10-CM

## 2023-06-17 DIAGNOSIS — Z7985 Long-term (current) use of injectable non-insulin antidiabetic drugs: Secondary | ICD-10-CM

## 2023-06-17 DIAGNOSIS — Z794 Long term (current) use of insulin: Secondary | ICD-10-CM

## 2023-06-17 DIAGNOSIS — I152 Hypertension secondary to endocrine disorders: Secondary | ICD-10-CM

## 2023-06-17 DIAGNOSIS — E1159 Type 2 diabetes mellitus with other circulatory complications: Secondary | ICD-10-CM

## 2023-06-17 DIAGNOSIS — H02536 Eyelid retraction left eye, unspecified eyelid: Secondary | ICD-10-CM

## 2023-06-17 DIAGNOSIS — Z7984 Long term (current) use of oral hypoglycemic drugs: Secondary | ICD-10-CM

## 2023-06-17 DIAGNOSIS — L989 Disorder of the skin and subcutaneous tissue, unspecified: Secondary | ICD-10-CM

## 2023-06-17 DIAGNOSIS — E669 Obesity, unspecified: Secondary | ICD-10-CM

## 2023-06-17 NOTE — Progress Notes (Signed)
Patient ID: Bradley Cox, male    DOB: Feb 28, 1971  MRN: [id number]  CC: Referral and Eye Problem   Subjective: Bradley Cox is a 52 y.o. male who presents for UC visit His concerns today include:  Patient with history of DM type II with neuropathy, morbid obesity, HTN,  HL, DVT right leg, asthma. MSSA bacteremia, depression, basal cell CA.    Pt feels the lesion on the RT nostril is getting bigger.  Seen by a dermatologist at a free health care event in [place] last yr and told it is likely basal cell CA.  I advised him to apply for medicaid.  He states he had gone to the [facility] office after last visit but they were busy.  Was not aware of [facility]. Request derm referral.   Visit 11/26/2022:  Complains of intermittent retraction and swelling of the left upper eyelid x 4 months.  No initiating factors.  He just wakes up some mornings with the lead retracted and swollen.  He has to use a warm compress to get the swelling to go down.  Can last for about 3 hours and is painful.  Patient shows me to pictures on his phone of his eye on last episode.  He has not had an eye exam due to lack of insurance.  Has change laundry detergent and shampoo but this still occurs about 2-3 times a wk.  BP elev today.  Took meds already today.  Has not checked BP in last 2 wks.  Previously it was running good Patient Active Problem List   Diagnosis Date Noted   Lesion of skin of nose 07/01/2020   Primary insomnia 02/29/2020   Obesity (BMI 30-39.9) 10/20/2019   Drug induced constipation    Uncontrolled type 2 diabetes mellitus with hyperglycemia (HCC)    Acute blood loss anemia    Postoperative pain    Abscess of right hip 07/09/2019   Compression of right sciatic nerve 07/09/2019   Right elbow pain 06/17/2015   Cavitary lesion of lung 06/16/2015   Patent foramen ovale 06/13/2015   Sepsis (HCC) 06/11/2015   Bacteremia due to Staphylococcus aureus 06/11/2015   Hyponatremia 06/11/2015    Asthma 06/11/2015   Pleuritic chest pain 06/11/2015   Pneumonia due to infectious organism 06/11/2015   Type 2 diabetes mellitus with hyperglycemia (HCC) 06/11/2015   Morbid obesity (HCC) 01/21/2009   GERD 01/21/2009   DYSPHAGIA 01/21/2009   HTN (hypertension) 01/07/2009   ASTHMA 01/07/2009     Current Outpatient Medications on File Prior to Visit  Medication Sig Dispense Refill   acetaminophen (TYLENOL) 325 MG tablet Take 2 tablets (650 mg total) by mouth every 6 (six) hours as needed for mild pain.     albuterol (PROVENTIL HFA) 108 (90 Base) MCG/ACT inhaler Inhale 1 puff into the lungs every 6 (six) hours as needed for wheezing or shortness of breath. 6.7 g 1   atorvastatin (LIPITOR) 20 MG tablet Take 1 tablet (20 mg total) by mouth daily. 90 tablet 1   Blood Glucose Monitoring Suppl (TRUE METRIX METER) w/Device KIT Use as directed 1 kit 0   glucose blood (TRUE METRIX BLOOD GLUCOSE TEST) test strip Use as instructed 100 each 12   hydrALAZINE (APRESOLINE) 50 MG tablet Take 1.5 tablets (75 mg total) by mouth 3 (three) times daily. 405 tablet 2   insulin aspart (NOVOLOG) 100 UNIT/ML FlexPen Inject 5 Units into the skin 2 (two) times daily with breakfast and dinner. 15 mL  11   insulin NPH Human (HUMULIN N) 100 UNIT/ML injection Inject 0.25 mLs (25 Units total) into the skin 2 (two) times daily at 8 am and 10 pm. 30 mL 6   Insulin Syringe-Needle U-100 31G X 15/64" 1 ML MISC Use as directed 100 each 4   losartan (COZAAR) 50 MG tablet TAKE 1 TABLET (50 MG TOTAL) BY MOUTH DAILY. 90 tablet 2   metFORMIN (GLUCOPHAGE-XR) 500 MG 24 hr tablet Take 2 tablets (1,000 mg total) by mouth 2 (two) times daily. 360 tablet 1   Multiple Vitamin (THERA) TABS Take 2 tablets by mouth daily.     Semaglutide, 2 MG/DOSE, 8 MG/3ML SOPN Inject 2 mg as directed once a week. 3 mL 6   TRUEplus Lancets 28G MISC Use as directed 100 each 4   No current facility-administered medications on file prior to visit.     Allergies  Allergen Reactions   Lisinopril Cough   Toradol [Ketorolac Tromethamine]     SOB, sweating. Tolerates Ibuprofen. Wife reports reaction may have been due to injection.    Social History   Socioeconomic History   Marital status: Single    Spouse name: Not on file   Number of children: Not on file   Years of education: Not on file   Highest education level: Not on file  Occupational History   Not on file  Tobacco Use   Smoking status: Never   Smokeless tobacco: Current    Types: Snuff  Vaping Use   Vaping status: Never Used  Substance and Sexual Activity   Alcohol use: Yes    Comment: rarely   Drug use: Never   Sexual activity: Not on file  Other Topics Concern   Not on file  Social History Narrative   Not on file   Social Determinants of Health   Financial Resource Strain: Not on file  Food Insecurity: Not on file  Transportation Needs: Not on file  Physical Activity: Not on file  Stress: Not on file  Social Connections: Not on file  Intimate Partner Violence: Not on file    Family History  Problem Relation Age of Onset   Diabetes Paternal Grandmother     Past Surgical History:  Procedure Laterality Date   APPLICATION OF WOUND VAC Right 07/17/2019   Procedure: Application Of Wound Vac;  Surgeon: Myrene Galas, MD;  Location: [facility] OR;  Service: Orthopedics;  Laterality: Right;   APPLICATION OF WOUND VAC Right 07/20/2019   Procedure: Application Of Wound Vac;  Surgeon: Myrene Galas, MD;  Location: [facility] OR;  Service: Orthopedics;  Laterality: Right;   APPLICATION OF WOUND VAC Right 07/27/2019   Procedure: Application Of Wound Vac;  Surgeon: Roby Lofts, MD;  Location: [facility] OR;  Service: Orthopedics;  Laterality: Right;   HERNIA REPAIR     As a baby   I & D EXTREMITY Right 07/27/2019   Procedure: IRRIGATION AND DEBRIDEMENT EXTREMITY Right Leg;  Surgeon: Roby Lofts, MD;  Location: [facility] OR;  Service: Orthopedics;  Laterality: Right;   I & D  EXTREMITY Right 07/30/2019   Procedure: IRRIGATION AND DEBRIDEMENT EXTREMITY WITH WOUND VAC CHANGE;  Surgeon: Roby Lofts, MD;  Location: [facility] OR;  Service: Orthopedics;  Laterality: Right;  IRRIGATION AND DEBRIDEMENT EXTREMITY WITH WOUND VAC CHANGE   I & D EXTREMITY Right 08/08/2019   Procedure: IRRIGATION AND DEBRIDEMENT RIGHT EXTREMITY;  Surgeon: Roby Lofts, MD;  Location: [facility] OR;  Service: Orthopedics;  Laterality: Right;  INCISION AND DRAINAGE ABSCESS Right 07/09/2019   Procedure: INCISION AND DRAINAGE ABSCESS RIGHT HIP/PELVIS;  Surgeon: Roby Lofts, MD;  Location: [facility] OR;  Service: Orthopedics;  Laterality: Right;   INCISION AND DRAINAGE HIP Right 07/17/2019   Procedure: IRRIGATION AND DEBRIDEMENT HIP;  Surgeon: Myrene Galas, MD;  Location: [facility] OR;  Service: Orthopedics;  Laterality: Right;   INCISION AND DRAINAGE HIP Right 07/20/2019   Procedure: Irrigation And Debridement Hip;  Surgeon: Myrene Galas, MD;  Location: [facility] OR;  Service: Orthopedics;  Laterality: Right;   TEE WITHOUT CARDIOVERSION N/A 07/11/2019   Procedure: TRANSESOPHAGEAL ECHOCARDIOGRAM (TEE);  Surgeon: Thurmon Fair, MD;  Location: [facility] ENDOSCOPY;  Service: Cardiovascular;  Laterality: N/A;    ROS: Review of Systems Negative except as stated above  PHYSICAL EXAM: BP 126/85   Pulse 94   Wt (!) 424 lb 6.4 oz (192.5 kg)   SpO2 93%   BMI 49.04 kg/m   Physical Exam   General appearance - alert, well appearing, and in no distress Mental status - normal mood, behavior, speech, dress, motor activity, and thought processes Skin - Pearly raised 1-1.5 cm lesion on RT nostril     Latest Ref Rng & Units 11/26/2022    4:54 PM 07/16/2021    4:38 PM 07/01/2020    4:33 PM  CMP  Glucose 70 - 99 mg/dL 595  638  756   BUN 6 - 24 mg/dL 12  12  12    Creatinine 0.76 - 1.27 mg/dL 4.33  2.95  1.88   Sodium 134 - 144 mmol/L 141  139  138   Potassium 3.5 - 5.2 mmol/L 4.5  4.1  4.3   Chloride 96 - 106 mmol/L 101  97  98   CO2  20 - 29 mmol/L 25  26  26    Calcium 8.7 - 10.2 mg/dL 9.8  9.7  41.6   Total Protein 6.0 - 8.5 g/dL 7.5  7.4  7.8   Total Bilirubin 0.0 - 1.2 mg/dL 0.4  0.4  0.5   Alkaline Phos 44 - 121 IU/L 91  100  118   AST 0 - 40 IU/L 18  35  18   ALT 0 - 44 IU/L 22  50  23    Lipid Panel     Component Value Date/Time   CHOL 176 11/26/2022 1654   TRIG 215 (H) 11/26/2022 1654   HDL 35 (L) 11/26/2022 1654   CHOLHDL 5.0 11/26/2022 1654   CHOLHDL 6.0 07/28/2019 0553   VLDL 26 07/28/2019 0553   LDLCALC 104 (H) 11/26/2022 1654    CBC    Component Value Date/Time   WBC 7.7 11/26/2022 1654   WBC 4.0 08/27/2019 0344   RBC 5.27 11/26/2022 1654   RBC 2.75 (L) 08/27/2019 0344   HGB 16.0 11/26/2022 1654   HCT 46.5 11/26/2022 1654   PLT 204 11/26/2022 1654   MCV 88 11/26/2022 1654   MCH 30.4 11/26/2022 1654   MCH 27.3 08/27/2019 0344   MCHC 34.4 11/26/2022 1654   MCHC 29.1 (L) 08/27/2019 0344   RDW 12.9 11/26/2022 1654   LYMPHSABS 1.5 08/27/2019 0344   MONOABS 0.5 08/27/2019 0344   EOSABS 0.3 08/27/2019 0344   BASOSABS 0.0 08/27/2019 0344    ASSESSMENT AND PLAN:  1. Lesion of skin of nose This lesion looks like basal cell CA.  Again I strongly advised him to apply for Medicaid and let me know once approved.  I will also have my CMA  give him the forms to apply for the [facility] financial. - Ambulatory referral to Dermatology  2. Lid retraction of left eye Referral submitted to ophthalmology. - Ambulatory referral to Ophthalmology  3. Hypertension associated with diabetes (HCC) Repeat blood pressure today is better.  Continue hydralazine 75 mg 3 times a day and Cozaar 50 mg daily.  Keep a log of blood pressure readings and bring them with him on his next visit.  Patient was given the opportunity to ask questions.  Patient verbalized understanding of the plan and was able to repeat key elements of the plan.   This documentation was completed using Paediatric nurse.  Any  transcriptional errors are unintentional.  Orders Placed This Encounter  Procedures   Ambulatory referral to Dermatology   Ambulatory referral to Ophthalmology     Requested Prescriptions    No prescriptions requested or ordered in this encounter    No follow-ups on file.  Jonah Blue, MD, FACP

## 2023-06-17 NOTE — Patient Instructions (Signed)
Please apply for the [facility] card[facility] card.  Once approved, please let me know.  Please stop at the [facility] office upstairs today to see if you can get approved for Medicaid.  If you are approved, please let me know.

## 2023-06-21 ENCOUNTER — Other Ambulatory Visit: Payer: Self-pay

## 2023-07-06 ENCOUNTER — Other Ambulatory Visit: Payer: Self-pay

## 2023-07-06 ENCOUNTER — Other Ambulatory Visit: Payer: Self-pay | Admitting: Internal Medicine

## 2023-07-06 MED ORDER — METFORMIN HCL ER 500 MG PO TB24
1000.0000 mg | ORAL_TABLET | Freq: Two times a day (BID) | ORAL | 0 refills | Status: DC
  Filled 2023-07-06: qty 360, 90d supply, fill #0

## 2023-07-07 ENCOUNTER — Other Ambulatory Visit: Payer: Self-pay

## 2023-07-08 ENCOUNTER — Other Ambulatory Visit: Payer: Self-pay

## 2023-08-05 ENCOUNTER — Other Ambulatory Visit: Payer: Self-pay

## 2023-08-05 ENCOUNTER — Ambulatory Visit: Payer: Self-pay | Admitting: Internal Medicine

## 2023-08-12 ENCOUNTER — Other Ambulatory Visit: Payer: Self-pay

## 2023-08-16 ENCOUNTER — Other Ambulatory Visit: Payer: Self-pay

## 2023-09-02 ENCOUNTER — Other Ambulatory Visit: Payer: Self-pay

## 2023-09-13 ENCOUNTER — Other Ambulatory Visit: Payer: Self-pay | Admitting: Internal Medicine

## 2023-09-13 ENCOUNTER — Other Ambulatory Visit: Payer: Self-pay

## 2023-09-13 MED ORDER — METFORMIN HCL ER 500 MG PO TB24
1000.0000 mg | ORAL_TABLET | Freq: Two times a day (BID) | ORAL | 0 refills | Status: DC
  Filled 2023-09-13 – 2023-09-15 (×3): qty 360, 90d supply, fill #0

## 2023-09-14 ENCOUNTER — Other Ambulatory Visit: Payer: Self-pay

## 2023-09-15 ENCOUNTER — Other Ambulatory Visit: Payer: Self-pay | Admitting: Internal Medicine

## 2023-09-15 ENCOUNTER — Other Ambulatory Visit: Payer: Self-pay

## 2023-09-15 MED ORDER — OZEMPIC (2 MG/DOSE) 8 MG/3ML ~~LOC~~ SOPN
2.0000 mg | PEN_INJECTOR | SUBCUTANEOUS | 0 refills | Status: DC
Start: 2023-09-15 — End: 2024-02-03
  Filled 2023-09-15 – 2023-11-30 (×2): qty 3, 28d supply, fill #0

## 2023-09-19 ENCOUNTER — Other Ambulatory Visit: Payer: Self-pay

## 2023-10-11 ENCOUNTER — Encounter: Payer: Self-pay | Admitting: Internal Medicine

## 2023-10-11 ENCOUNTER — Ambulatory Visit: Payer: Self-pay | Attending: Internal Medicine | Admitting: Internal Medicine

## 2023-10-11 DIAGNOSIS — F320 Major depressive disorder, single episode, mild: Secondary | ICD-10-CM

## 2023-10-11 DIAGNOSIS — I152 Hypertension secondary to endocrine disorders: Secondary | ICD-10-CM

## 2023-10-11 DIAGNOSIS — E1159 Type 2 diabetes mellitus with other circulatory complications: Secondary | ICD-10-CM

## 2023-10-11 DIAGNOSIS — Z23 Encounter for immunization: Secondary | ICD-10-CM

## 2023-10-11 DIAGNOSIS — E1169 Type 2 diabetes mellitus with other specified complication: Secondary | ICD-10-CM

## 2023-10-11 DIAGNOSIS — Z7984 Long term (current) use of oral hypoglycemic drugs: Secondary | ICD-10-CM

## 2023-10-11 DIAGNOSIS — L989 Disorder of the skin and subcutaneous tissue, unspecified: Secondary | ICD-10-CM

## 2023-10-11 DIAGNOSIS — E785 Hyperlipidemia, unspecified: Secondary | ICD-10-CM

## 2023-10-11 LAB — POCT GLYCOSYLATED HEMOGLOBIN (HGB A1C): HbA1c, POC (controlled diabetic range): 6.7 % (ref 0.0–7.0)

## 2023-10-11 LAB — GLUCOSE, POCT (MANUAL RESULT ENTRY): POC Glucose: 153 mg/dL — AB (ref 70–99)

## 2023-10-11 MED ORDER — METFORMIN HCL ER 500 MG PO TB24
1000.0000 mg | ORAL_TABLET | Freq: Two times a day (BID) | ORAL | 0 refills | Status: DC
Start: 2023-10-11 — End: 2024-02-03
  Filled 2023-10-11: qty 360, 90d supply, fill #0

## 2023-10-11 MED ORDER — LOSARTAN POTASSIUM 50 MG PO TABS
50.0000 mg | ORAL_TABLET | Freq: Every day | ORAL | 2 refills | Status: DC
  Filled 2023-10-11: qty 90, 90d supply, fill #0
  Filled 2024-02-03 (×2): qty 30, 30d supply, fill #1
  Filled 2024-04-09: qty 30, 30d supply, fill #2
  Filled 2024-05-31: qty 30, 30d supply, fill #3
  Filled 2024-09-13: qty 30, 30d supply, fill #4

## 2023-10-11 MED ORDER — INSULIN NPH (HUMAN) (ISOPHANE) 100 UNIT/ML ~~LOC~~ SUSP
25.0000 [IU] | Freq: Two times a day (BID) | SUBCUTANEOUS | 6 refills | Status: DC
Start: 2023-10-11 — End: 2024-02-09
  Filled 2023-10-11: qty 30, 60d supply, fill #0
  Filled 2023-11-30: qty 20, 40d supply, fill #0
  Filled 2024-02-03: qty 20, 40d supply, fill #1

## 2023-10-11 NOTE — Progress Notes (Unsigned)
Patient ID: Bradley Cox, male    DOB: 08/21/1971  MRN: [id number]  CC: Diabetes (DM f/u. Med refill. /Reports unable to to pick-up metformin please send refill. [name] to flu vax.)   Subjective: Bradley Cox is a 52 y.o. male who presents for chronic ds management. His concerns today include:  Patient with history of DM type II with neuropathy, morbid obesity, HTN,  HL, DVT right leg, asthma. MSSA bacteremia, depression, basal cell CA.    Discussed the use of AI scribe software for clinical note transcription with the patient, who gave verbal consent to proceed.  History of Present Illness   DM:  Results for orders placed or performed in visit on 10/11/23  POCT glycosylated hemoglobin (Hb A1C)  Result Value Ref Range   Hemoglobin A1C     HbA1c POC (<> result, manual entry)     HbA1c, POC (prediabetic range)     HbA1c, POC (controlled diabetic range) 6.7 0.0 - 7.0 %  POCT glucose (manual entry)  Result Value Ref Range   POC Glucose 153 (A) 70 - 99 mg/dl  Currently on Humulin and 25 units twice a day, metformin 1 g twice a day and Ozempic 2 mg once a week.  -Reports diarrhea for the first 2 days after administration of Ozempic.  This occurred even with the lower doses.  He takes Imodium when needed.  Forgot to take his last dose of Ozempic. -Reports a decreased appetite with Ozempic but has not had any significant weight loss on it.  Likes the fact that it has had on his blood sugar control. -Started having to eat breakfast because if he does not and takes all of his medications, he vomits mucus within 30 mins of taking oral meds. -Reports neuropathy symptoms in his feet especially the right.  He did not tolerate gabapentin in the past.  Uses diclofenac gel over-the-counter with good results. -Reports having had Eye Exam at [facility] in [place]  HTN: ran out of Losartan for several wks.  Needs RF.  Pliant with hydralazine 75 mg 3 times a day.  He still needs to  get in with dermatology for the lesion on the right nostril.  Patient states he will have insurance as of November 02, 2023 and has already made an appointment with a dermatologist pending the approval of his insurance.    Depression screen still positive with a score of 13 unchanged from previous.  Reports he is still doing okay and not going to a dark place.  No thoughts of wanting to hurt himself.  Does not wish to be restarted on medication.    Patient Active Problem List   Diagnosis Date Noted   Lesion of skin of nose 07/01/2020   Primary insomnia 02/29/2020   Obesity (BMI 30-39.9) 10/20/2019   Drug induced constipation    Uncontrolled type 2 diabetes mellitus with hyperglycemia (HCC)    Acute blood loss anemia    Postoperative pain    Abscess of right hip 07/09/2019   Compression of right sciatic nerve 07/09/2019   Right elbow pain 06/17/2015   Cavitary lesion of lung 06/16/2015   Patent foramen ovale 06/13/2015   Sepsis (HCC) 06/11/2015   Bacteremia due to Staphylococcus aureus 06/11/2015   Hyponatremia 06/11/2015   Asthma 06/11/2015   Pleuritic chest pain 06/11/2015   Pneumonia due to infectious organism 06/11/2015   Type 2 diabetes mellitus with hyperglycemia (HCC) 06/11/2015   Morbid obesity (HCC) 01/21/2009   GERD  01/21/2009   DYSPHAGIA 01/21/2009   HTN (hypertension) 01/07/2009   Asthma 01/07/2009     Current Outpatient Medications on File Prior to Visit  Medication Sig Dispense Refill   acetaminophen (TYLENOL) 325 MG tablet Take 2 tablets (650 mg total) by mouth every 6 (six) hours as needed for mild pain.     albuterol (PROVENTIL HFA) 108 (90 Base) MCG/ACT inhaler Inhale 1 puff into the lungs every 6 (six) hours as needed for wheezing or shortness of breath. 6.7 g 1   atorvastatin (LIPITOR) 20 MG tablet Take 1 tablet (20 mg total) by mouth daily. 90 tablet 1   Blood Glucose Monitoring Suppl (TRUE METRIX METER) w/Device KIT Use as directed 1 kit 0   glucose blood  (TRUE METRIX BLOOD GLUCOSE TEST) test strip Use as instructed 100 each 12   hydrALAZINE (APRESOLINE) 50 MG tablet Take 1.5 tablets (75 mg total) by mouth 3 (three) times daily. 405 tablet 2   Insulin Syringe-Needle U-100 31G X 15/64" 1 ML MISC Use as directed 100 each 4   Multiple Vitamin (THERA) TABS Take 2 tablets by mouth daily.     Semaglutide, 2 MG/DOSE, (OZEMPIC, 2 MG/DOSE,) 8 MG/3ML SOPN Inject 2 mg as directed once a week. 3 mL 0   TRUEplus Lancets 28G MISC Use as directed 100 each 4   No current facility-administered medications on file prior to visit.    Allergies  Allergen Reactions   Lisinopril Cough   Toradol [Ketorolac Tromethamine]     SOB, sweating. Tolerates Ibuprofen. Wife reports reaction may have been due to injection.    Social History   Socioeconomic History   Marital status: Single    Spouse name: Not on file   Number of children: Not on file   Years of education: Not on file   Highest education level: Not on file  Occupational History   Not on file  Tobacco Use   Smoking status: Never   Smokeless tobacco: Current    Types: Snuff  Vaping Use   Vaping status: Never Used  Substance and Sexual Activity   Alcohol use: Yes    Comment: rarely   Drug use: Never   Sexual activity: Not on file  Other Topics Concern   Not on file  Social History Narrative   Not on file   Social Determinants of Health   Financial Resource Strain: Not on file  Food Insecurity: Food Insecurity Present (10/11/2023)   Hunger Vital Sign    Worried About Running Out of Food in the Last Year: Sometimes true    Ran Out of Food in the Last Year: Sometimes true  Transportation Needs: No Transportation Needs (10/11/2023)   PRAPARE - Administrator, Civil Service (Medical): No    Lack of Transportation (Non-Medical): No  Physical Activity: Not on file  Stress: Not on file  Social Connections: Moderately Integrated (10/11/2023)   Social Connection and Isolation Panel  [NHANES]    Frequency of Communication with Friends and Family: More than three times a week    Frequency of Social Gatherings with Friends and Family: Once a week    Attends Religious Services: More than 4 times per year    Active Member of [employer] or Organizations: No    Attends Engineer, structural: More than 4 times per year    Marital Status: Never married  Intimate Partner Violence: Not At Risk (10/11/2023)   Humiliation, Afraid, Rape, and Kick questionnaire    Fear  of Current or Ex-Partner: No    Emotionally Abused: No    Physically Abused: No    Sexually Abused: No    Family History  Problem Relation Age of Onset   Diabetes Paternal Grandmother     Past Surgical History:  Procedure Laterality Date   APPLICATION OF WOUND VAC Right 07/17/2019   Procedure: Application Of Wound Vac;  Surgeon: Myrene Galas, MD;  Location: [facility] OR;  Service: Orthopedics;  Laterality: Right;   APPLICATION OF WOUND VAC Right 07/20/2019   Procedure: Application Of Wound Vac;  Surgeon: Myrene Galas, MD;  Location: [facility] OR;  Service: Orthopedics;  Laterality: Right;   APPLICATION OF WOUND VAC Right 07/27/2019   Procedure: Application Of Wound Vac;  Surgeon: Roby Lofts, MD;  Location: [facility] OR;  Service: Orthopedics;  Laterality: Right;   HERNIA REPAIR     As a baby   I & D EXTREMITY Right 07/27/2019   Procedure: IRRIGATION AND DEBRIDEMENT EXTREMITY Right Leg;  Surgeon: Roby Lofts, MD;  Location: [facility] OR;  Service: Orthopedics;  Laterality: Right;   I & D EXTREMITY Right 07/30/2019   Procedure: IRRIGATION AND DEBRIDEMENT EXTREMITY WITH WOUND VAC CHANGE;  Surgeon: Roby Lofts, MD;  Location: [facility] OR;  Service: Orthopedics;  Laterality: Right;  IRRIGATION AND DEBRIDEMENT EXTREMITY WITH WOUND VAC CHANGE   I & D EXTREMITY Right 08/08/2019   Procedure: IRRIGATION AND DEBRIDEMENT RIGHT EXTREMITY;  Surgeon: Roby Lofts, MD;  Location: [facility] OR;  Service: Orthopedics;  Laterality: Right;   INCISION  AND DRAINAGE ABSCESS Right 07/09/2019   Procedure: INCISION AND DRAINAGE ABSCESS RIGHT HIP/PELVIS;  Surgeon: Roby Lofts, MD;  Location: [facility] OR;  Service: Orthopedics;  Laterality: Right;   INCISION AND DRAINAGE HIP Right 07/17/2019   Procedure: IRRIGATION AND DEBRIDEMENT HIP;  Surgeon: Myrene Galas, MD;  Location: [facility] OR;  Service: Orthopedics;  Laterality: Right;   INCISION AND DRAINAGE HIP Right 07/20/2019   Procedure: Irrigation And Debridement Hip;  Surgeon: Myrene Galas, MD;  Location[facility] OR;  Service: Orthopedics;  Laterality: Right;   TEE WITHOUT CARDIOVERSION N/A 07/11/2019   Procedure: TRANSESOPHAGEAL ECHOCARDIOGRAM (TEE);  Surgeon: Thurmon Fair, MD;  Location: [facility] ENDOSCOPY;  Service: Cardiovascular;  Laterality: N/A;    ROS: Review of Systems Negative except as stated above  PHYSICAL EXAM: BP (!) 157/100 (BP Location: Left Arm, Patient Position: Sitting, Cuff Size: Large)   Pulse 87   Temp 98.5 F (36.9 C) (Oral)   Ht 6\' 6"  (1.981 m)   Wt (!) 429 lb (194.6 kg)   SpO2 93%   BMI 49.58 kg/m   Wt Readings from Last 3 Encounters:  10/11/23 (!) 429 lb (194.6 kg)  06/17/23 (!) 424 lb 6.4 oz (192.5 kg)  04/01/23 (!) 423 lb 6.4 oz (192.1 kg)    Physical Exam  General appearance - alert, well appearing, and in no distress Mental status - normal mood, behavior, speech, dress, motor activity, and thought processes Nose -still has about a 1 cm pearly appearing lesion on the right nares with a black center. Neck - supple, no significant adenopathy Chest - clear to auscultation, no wheezes, rales or rhonchi, symmetric air entry Heart - normal rate, regular rhythm, normal S1, S2, no murmurs, rubs, clicks or gallops Extremities -trace lower extremity edema    06/17/2023    2:22 PM 04/01/2023    3:45 PM 11/26/2022    4:05 PM  Depression screen PHQ 2/9  Decreased Interest 1 1 1   Down,  Depressed, Hopeless 2 1 1   PHQ - 2 Score 3 2 2   Altered sleeping 3 3 3   Tired, decreased  energy 3 3 3   Change in appetite 2 1 2   Feeling bad or failure about yourself  1 1 1   Trouble concentrating 1 2 1   Moving slowly or fidgety/restless 0 1 0  Suicidal thoughts 0 0 0  PHQ-9 Score 13 13 12         Latest Ref Rng & Units 11/26/2022    4:54 PM 07/16/2021    4:38 PM 07/01/2020    4:33 PM  CMP  Glucose 70 - 99 mg/dL 161  096  045   BUN 6 - 24 mg/dL 12  12  12    Creatinine 0.76 - 1.27 mg/dL 4.09  8.11  9.14   Sodium 134 - 144 mmol/L 141  139  138   Potassium 3.5 - 5.2 mmol/L 4.5  4.1  4.3   Chloride 96 - 106 mmol/L 101  97  98   CO2 20 - 29 mmol/L 25  26  26    Calcium 8.7 - 10.2 mg/dL 9.8  9.7  78.2   Total Protein 6.0 - 8.5 g/dL 7.5  7.4  7.8   Total Bilirubin 0.0 - 1.2 mg/dL 0.4  0.4  0.5   Alkaline Phos 44 - 121 IU/L 91  100  118   AST 0 - 40 IU/L 18  35  18   ALT 0 - 44 IU/L 22  50  23    Lipid Panel     Component Value Date/Time   CHOL 176 11/26/2022 1654   TRIG 215 (H) 11/26/2022 1654   HDL 35 (L) 11/26/2022 1654   CHOLHDL 5.0 11/26/2022 1654   CHOLHDL 6.0 07/28/2019 0553   VLDL 26 07/28/2019 0553   LDLCALC 104 (H) 11/26/2022 1654    CBC    Component Value Date/Time   WBC 7.7 11/26/2022 1654   WBC 4.0 08/27/2019 0344   RBC 5.27 11/26/2022 1654   RBC 2.75 (L) 08/27/2019 0344   HGB 16.0 11/26/2022 1654   HCT 46.5 11/26/2022 1654   PLT 204 11/26/2022 1654   MCV 88 11/26/2022 1654   MCH 30.4 11/26/2022 1654   MCH 27.3 08/27/2019 0344   MCHC 34.4 11/26/2022 1654   MCHC 29.1 (L) 08/27/2019 0344   RDW 12.9 11/26/2022 1654   LYMPHSABS 1.5 08/27/2019 0344   MONOABS 0.5 08/27/2019 0344   EOSABS 0.3 08/27/2019 0344   BASOSABS 0.0 08/27/2019 0344    ASSESSMENT AND PLAN: 1. Type 2 diabetes mellitus with morbid obesity (HCC) Patient reports diarrhea for the first 2 days after administering Ozempic.  We discussed stopping the Ozempic but patient wished to continue taking it because of its effect on decreasing his A1c.   Continue Ozempic 2mg  weekly.   Continue current dose of Humulin and and metformin. -Consider switching to [facility] once patient's insurance is active to potentially reduce gastrointestinal side effects and promote weight loss. - POCT glycosylated hemoglobin (Hb A1C) - POCT glucose (manual entry) - metFORMIN (GLUCOPHAGE-XR) 500 MG 24 hr tablet; Take 2 tablets (1,000 mg total) by mouth 2 (two) times daily.  Dispense: 360 tablet; Refill: 0 - insulin NPH Human (HUMULIN N) 100 UNIT/ML injection; Inject 0.25 mLs (25 Units total) into the skin 2 (two) times daily at 8 am and 10 pm.  Dispense: 30 mL; Refill: 6 - CBC; Future - Comprehensive metabolic panel; Future - Lipid panel; Future - Microalbumin /  creatinine urine ratio; Future - Microalbumin / creatinine urine ratio  2. Hypertension associated with diabetes (HCC) Not at goal due to being out of Cozaar.  Refills sent.  Continue hydralazine 75 mg 3 times a day. - losartan (COZAAR) 50 MG tablet; Take 1 tablet (50 mg total) by mouth daily.  Dispense: 90 tablet; Refill: 2  3. Hyperlipidemia associated with type 2 diabetes mellitus (HCC) Continue atorvastatin 20 mg daily  4. Mild major depression (HCC) Patient reports he is doing okay.  Not interested in restarting medication at this time.  5. Lesion of skin of nose He will be insured as of January 1 and states that he has already scheduled an appointment with a dermatologist to have this evaluated.  Looks like basal cell CA  6. Encounter for immunization - Flu vaccine trivalent PF, 6mos and older(Flulaval,Afluria,Fluarix,Fluzone)     Patient was given the opportunity to ask questions.  Patient verbalized understanding of the plan and was able to repeat key elements of the plan.   This documentation was completed using Paediatric nurse.  Any transcriptional errors are unintentional.  Orders Placed This Encounter  Procedures   Flu vaccine trivalent PF, 6mos and older(Flulaval,Afluria,Fluarix,Fluzone)    CBC   Comprehensive metabolic panel   Lipid panel   Microalbumin / creatinine urine ratio   POCT glycosylated hemoglobin (Hb A1C)   POCT glucose (manual entry)     Requested Prescriptions   Signed Prescriptions Disp Refills   metFORMIN (GLUCOPHAGE-XR) 500 MG 24 hr tablet 360 tablet 0    Sig: Take 2 tablets (1,000 mg total) by mouth 2 (two) times daily.   insulin NPH Human (HUMULIN N) 100 UNIT/ML injection 30 mL 6    Sig: Inject 0.25 mLs (25 Units total) into the skin 2 (two) times daily at 8 am and 10 pm.   losartan (COZAAR) 50 MG tablet 90 tablet 2    Sig: Take 1 tablet (50 mg total) by mouth daily.    Return in about 4 months (around 02/09/2024).  Jonah Blue, MD, FACP

## 2023-10-12 ENCOUNTER — Encounter: Payer: Self-pay | Admitting: Internal Medicine

## 2023-10-12 ENCOUNTER — Other Ambulatory Visit: Payer: Self-pay

## 2023-10-14 ENCOUNTER — Other Ambulatory Visit: Payer: Self-pay

## 2023-10-14 ENCOUNTER — Ambulatory Visit: Payer: Self-pay | Attending: Internal Medicine

## 2023-10-14 LAB — MICROALBUMIN / CREATININE URINE RATIO
Creatinine, Urine: 198.4 mg/dL
Microalb/Creat Ratio: 2 mg/g{creat} (ref 0–29)
Microalbumin, Urine: 3.7 ug/mL

## 2023-10-15 LAB — COMPREHENSIVE METABOLIC PANEL
ALT: 44 [IU]/L (ref 0–44)
AST: 30 [IU]/L (ref 0–40)
Albumin: 4.2 g/dL (ref 3.8–4.9)
Alkaline Phosphatase: 72 [IU]/L (ref 44–121)
BUN/Creatinine Ratio: 10 (ref 9–20)
BUN: 9 mg/dL (ref 6–24)
Bilirubin Total: 0.4 mg/dL (ref 0.0–1.2)
CO2: 24 mmol/L (ref 20–29)
Calcium: 9.6 mg/dL (ref 8.7–10.2)
Chloride: 102 mmol/L (ref 96–106)
Creatinine, Ser: 0.89 mg/dL (ref 0.76–1.27)
Globulin, Total: 2.9 g/dL (ref 1.5–4.5)
Glucose: 140 mg/dL — ABNORMAL HIGH (ref 70–99)
Potassium: 4.6 mmol/L (ref 3.5–5.2)
Sodium: 142 mmol/L (ref 134–144)
Total Protein: 7.1 g/dL (ref 6.0–8.5)
eGFR: 102 mL/min/{1.73_m2} (ref >59)

## 2023-10-15 LAB — CBC
Hematocrit: 47.7 % (ref 37.5–51.0)
Hemoglobin: 16 g/dL (ref 13.0–17.7)
MCH: 30.5 pg (ref 26.6–33.0)
MCHC: 33.5 g/dL (ref 31.5–35.7)
MCV: 91 fL (ref 79–97)
Platelets: 193 10*3/uL (ref 150–450)
RBC: 5.24 x10E6/uL (ref 4.14–5.80)
RDW: 12.7 % (ref 11.6–15.4)
WBC: 7.1 10*3/uL (ref 3.4–10.8)

## 2023-10-15 LAB — LIPID PANEL
Chol/HDL Ratio: 3.7 {ratio} (ref 0.0–5.0)
Cholesterol, Total: 139 mg/dL (ref 100–199)
HDL: 38 mg/dL — ABNORMAL LOW (ref >39)
LDL Chol Calc (NIH): 73 mg/dL (ref 0–99)
Triglycerides: 165 mg/dL — ABNORMAL HIGH (ref 0–149)
VLDL Cholesterol Cal: 28 mg/dL (ref 5–40)

## 2023-10-18 ENCOUNTER — Telehealth: Payer: Self-pay

## 2023-10-18 NOTE — Telephone Encounter (Signed)
Pt was called and no vm was left due to mailbox being full. Information has been sent to nurse pool.  

## 2023-10-18 NOTE — Telephone Encounter (Signed)
-----   Message from Bradley Cox sent at 10/15/2023  5:32 PM EST ----- Let patient know that his cholesterol level looks good and improved from 10 months ago.  Continue the atorvastatin.  Kidney and liver function test normal.  Blood cell counts are normal.

## 2023-10-25 ENCOUNTER — Other Ambulatory Visit: Payer: Self-pay

## 2023-11-25 ENCOUNTER — Other Ambulatory Visit: Payer: Self-pay

## 2023-11-30 ENCOUNTER — Other Ambulatory Visit: Payer: Self-pay | Admitting: Internal Medicine

## 2023-11-30 ENCOUNTER — Other Ambulatory Visit: Payer: Self-pay

## 2023-11-30 DIAGNOSIS — I152 Hypertension secondary to endocrine disorders: Secondary | ICD-10-CM

## 2023-11-30 MED ORDER — HYDRALAZINE HCL 50 MG PO TABS
75.0000 mg | ORAL_TABLET | Freq: Three times a day (TID) | ORAL | 0 refills | Status: DC
  Filled 2023-11-30: qty 405, 90d supply, fill #0

## 2023-11-30 NOTE — Telephone Encounter (Signed)
Requested Prescriptions  Pending Prescriptions Disp Refills   hydrALAZINE (APRESOLINE) 50 MG tablet 405 tablet 0    Sig: Take 1.5 tablets (75 mg total) by mouth 3 (three) times daily.     Cardiovascular:  Vasodilators Failed - 11/30/2023 11:09 AM      Failed - ANA Screen, Ifa, Serum in normal range and within 360 days    No results found for: "ANA", "ANATITER", "LABANTI"       Failed - Last BP in normal range    BP Readings from Last 1 Encounters:  10/11/23 (!) 157/100         Passed - HCT in normal range and within 360 days    Hematocrit  Date Value Ref Range Status  10/14/2023 47.7 37.5 - 51.0 % Final         Passed - HGB in normal range and within 360 days    Hemoglobin  Date Value Ref Range Status  10/14/2023 16.0 13.0 - 17.7 g/dL Final         Passed - RBC in normal range and within 360 days    RBC  Date Value Ref Range Status  10/14/2023 5.24 4.14 - 5.80 x10E6/uL Final  08/27/2019 2.75 (L) 4.22 - 5.81 MIL/uL Final         Passed - WBC in normal range and within 360 days    WBC  Date Value Ref Range Status  10/14/2023 7.1 3.4 - 10.8 x10E3/uL Final  08/27/2019 4.0 4.0 - 10.5 K/uL Final         Passed - PLT in normal range and within 360 days    Platelets  Date Value Ref Range Status  10/14/2023 193 150 - 450 x10E3/uL Final         Passed - Valid encounter within last 12 months    Recent Outpatient Visits           1 month ago Type 2 diabetes mellitus with morbid obesity (HCC)   [facility] [place]. [facility] Marcine Matar, MD   5 months ago Lesion of skin of nose   [facility] [place]. [facility] Jonah Blue B, MD   8 months ago Type 2 diabetes mellitus with morbid obesity [facility])   [facility] Of [place]. [facility] Jonah Blue B, MD   1 year ago Type 2 diabetes mellitus with diabetic polyneuropathy, with long-term  current use of insulin (HCC)   [facility] - [facility] Dept Of [place]. [facility] Jonah Blue B, MD   1 year ago Type 2 diabetes mellitus with morbid obesity [facility])    [facility] Dept Of [place]. [facility] Marcine Matar, MD       Future Appointments             In 2 months Laural Benes Binnie Rail, MD [facility] - A Dept Of [place]a Bridegroom. [facility]

## 2023-12-01 ENCOUNTER — Other Ambulatory Visit: Payer: Self-pay

## 2023-12-07 ENCOUNTER — Other Ambulatory Visit: Payer: Self-pay

## 2024-02-03 ENCOUNTER — Other Ambulatory Visit: Payer: Self-pay | Admitting: Internal Medicine

## 2024-02-03 ENCOUNTER — Other Ambulatory Visit: Payer: Self-pay

## 2024-02-03 DIAGNOSIS — E119 Type 2 diabetes mellitus without complications: Secondary | ICD-10-CM

## 2024-02-03 DIAGNOSIS — E1169 Type 2 diabetes mellitus with other specified complication: Secondary | ICD-10-CM

## 2024-02-03 MED ORDER — METFORMIN HCL ER 500 MG PO TB24
1000.0000 mg | ORAL_TABLET | Freq: Two times a day (BID) | ORAL | 0 refills | Status: DC
  Filled 2024-02-03: qty 120, 30d supply, fill #0

## 2024-02-03 MED ORDER — TRUE METRIX BLOOD GLUCOSE TEST VI STRP
ORAL_STRIP | 0 refills | Status: DC
  Filled 2024-02-03: qty 100, 25d supply, fill #0

## 2024-02-03 MED ORDER — ATORVASTATIN CALCIUM 20 MG PO TABS
20.0000 mg | ORAL_TABLET | Freq: Every day | ORAL | 0 refills | Status: DC
  Filled 2024-02-03: qty 30, 30d supply, fill #0

## 2024-02-03 MED ORDER — OZEMPIC (2 MG/DOSE) 8 MG/3ML ~~LOC~~ SOPN
2.0000 mg | PEN_INJECTOR | SUBCUTANEOUS | 0 refills | Status: DC
  Filled 2024-02-03: qty 3, 28d supply, fill #0

## 2024-02-06 ENCOUNTER — Other Ambulatory Visit: Payer: Self-pay

## 2024-02-09 ENCOUNTER — Other Ambulatory Visit: Payer: Self-pay

## 2024-02-09 ENCOUNTER — Encounter: Payer: Self-pay | Admitting: Internal Medicine

## 2024-02-09 ENCOUNTER — Ambulatory Visit: Payer: Self-pay | Attending: Internal Medicine | Admitting: Internal Medicine

## 2024-02-09 DIAGNOSIS — E1169 Type 2 diabetes mellitus with other specified complication: Secondary | ICD-10-CM

## 2024-02-09 DIAGNOSIS — E785 Hyperlipidemia, unspecified: Secondary | ICD-10-CM

## 2024-02-09 DIAGNOSIS — E119 Type 2 diabetes mellitus without complications: Secondary | ICD-10-CM

## 2024-02-09 DIAGNOSIS — Z794 Long term (current) use of insulin: Secondary | ICD-10-CM

## 2024-02-09 DIAGNOSIS — Z7984 Long term (current) use of oral hypoglycemic drugs: Secondary | ICD-10-CM

## 2024-02-09 DIAGNOSIS — Z7985 Long-term (current) use of injectable non-insulin antidiabetic drugs: Secondary | ICD-10-CM

## 2024-02-09 DIAGNOSIS — R5383 Other fatigue: Secondary | ICD-10-CM

## 2024-02-09 DIAGNOSIS — F320 Major depressive disorder, single episode, mild: Secondary | ICD-10-CM

## 2024-02-09 DIAGNOSIS — Z6841 Body Mass Index (BMI) 40.0 and over, adult: Secondary | ICD-10-CM

## 2024-02-09 DIAGNOSIS — Z23 Encounter for immunization: Secondary | ICD-10-CM

## 2024-02-09 DIAGNOSIS — E1159 Type 2 diabetes mellitus with other circulatory complications: Secondary | ICD-10-CM

## 2024-02-09 LAB — POCT GLYCOSYLATED HEMOGLOBIN (HGB A1C): HbA1c, POC (controlled diabetic range): 7.6 % — AB (ref 0.0–7.0)

## 2024-02-09 LAB — GLUCOSE, POCT (MANUAL RESULT ENTRY): POC Glucose: 196 mg/dL — AB (ref 70–99)

## 2024-02-09 MED ORDER — INSULIN NPH (HUMAN) (ISOPHANE) 100 UNIT/ML ~~LOC~~ SUSP
SUBCUTANEOUS | 6 refills | Status: DC
  Filled 2024-02-09: qty 30, fill #0
  Filled 2024-05-15: qty 30, 57d supply, fill #0
  Filled 2024-07-16: qty 30, 57d supply, fill #1
  Filled 2024-09-13: qty 20, 38d supply, fill #2

## 2024-02-09 MED ORDER — HYDRALAZINE HCL 100 MG PO TABS
100.0000 mg | ORAL_TABLET | Freq: Three times a day (TID) | ORAL | 1 refills | Status: DC
  Filled 2024-02-09 – 2024-04-09 (×2): qty 90, 30d supply, fill #0
  Filled 2024-05-31: qty 90, 30d supply, fill #1
  Filled 2024-07-16: qty 90, 30d supply, fill #2
  Filled 2024-09-13: qty 90, 30d supply, fill #3

## 2024-02-09 NOTE — Patient Instructions (Signed)
 Increase insulin to 28 units in a.m and 25 units in p.m. Increase Hydralazine to 100 mg three times a day.

## 2024-02-09 NOTE — Progress Notes (Signed)
 Patient ID: Bradley Cox, male    DOB: [birth year]  MRN: [id number]  CC: Diabetes (DM f/u./Reports Hydralazine causing vomiting - discuss alternative [name] to pneumonia vax)   Subjective: Bradley Cox is a 53 y.o. male who presents for chronic ds management. His concerns today include:  Patient with history of DM type II with neuropathy, morbid obesity, HTN,  HL, DVT right leg, asthma. MSSA bacteremia, depression, basal cell CA.    Discussed the use of AI scribe software for clinical note transcription with the patient, who gave verbal consent to proceed.  History of Present Illness   The patient, with a history of hypertension, diabetes, and depression, presents for a four-month follow-up visit.   Nose lesion: He is not secured insurance as yet.  However he he has seen the dermatologist and reports having had biopsy confirmed that the lesion is basal cell.  Has an upcoming Mohs surgery scheduled which he states he will pay for out-of-pocket...  HTN: the patient reports a change in the manufacturer of his hydralazine medication. Since the change, he has been experiencing nausea and vomiting approximately 30-40 minutes after taking the medication. He has tried to manage these symptoms by eating a small amount of food with the medication, but the symptoms persist inconsistently.  Reports compliance with the Cozaar 50 mg daily.  DM:  Results for orders placed or performed in visit on 02/09/24  POCT glucose (manual entry)   Collection Time: 02/09/24  4:55 PM  Result Value Ref Range   POC Glucose 196 (A) 70 - 99 mg/dl  POCT glycosylated hemoglobin (Hb A1C)   Collection Time: 02/09/24  5:01 PM  Result Value Ref Range   Hemoglobin A1C     HbA1c POC (<> result, manual entry)     HbA1c, POC (prediabetic range)     HbA1c, POC (controlled diabetic range) 7.6 (A) 0.0 - 7.0 %  The patient's diabetes control has worsened, with an increase in HbA1c from 6.7 to 7.6. He reports no major changes  in eating habits, but does admit to consuming 2-3 oranges daily and occasional sweet treats. He is not getting in much activity.  States that he has pain in his feet at times but recently purchased new shoes with good insoles that is made it better.  He plans to join the gym and start going.  He is adherent to his prescribed medications, including Ozempic 2 mg Q wk, metformin 1 gram BID, and NPH insulin 24 units BID.  Checks blood sugars once a day in the mornings and gives range of 120-130.  He confirms taking the atorvastatin consistently for cholesterol.  Lastly, the patient reports feeling sluggish and having low energy, which he attributes to poor sleep.  He endorses that his ex-wife told him he snores if he lays on his back.  He does not wake up in the morning feeling unrefreshed or with morning headaches.  Denies any daytime sleepiness or falling asleep easily.    History of depression: His PHQ-9 is positive again today and increased from previous.  He denies feeling suicidal or needing to restart depression medication.     HM: Agreeable to receiving Prevnar 20 today.  Overdue for eye exam.  No insurance.   Patient Active Problem List   Diagnosis Date Noted   Lesion of skin of nose 07/01/2020   Primary insomnia 02/29/2020   Obesity (BMI 30-39.9) 10/20/2019   Drug induced constipation    Uncontrolled type 2 diabetes mellitus with  hyperglycemia (HCC)    Acute blood loss anemia    Postoperative pain    Abscess of right hip 07/09/2019   Compression of right sciatic nerve 07/09/2019   Right elbow pain 06/17/2015   Cavitary lesion of lung 06/16/2015   Patent foramen ovale 06/13/2015   Sepsis (HCC) 06/11/2015   Bacteremia due to Staphylococcus aureus 06/11/2015   Hyponatremia 06/11/2015   Asthma 06/11/2015   Pleuritic chest pain 06/11/2015   Pneumonia due to infectious organism 06/11/2015   Type 2 diabetes mellitus with hyperglycemia (HCC) 06/11/2015   Morbid obesity (HCC)  01/21/2009   GERD 01/21/2009   DYSPHAGIA 01/21/2009   HTN (hypertension) 01/07/2009   Asthma 01/07/2009     Current Outpatient Medications on File Prior to Visit  Medication Sig Dispense Refill   acetaminophen (TYLENOL) 325 MG tablet Take 2 tablets (650 mg total) by mouth every 6 (six) hours as needed for mild pain.     albuterol (PROVENTIL HFA) 108 (90 Base) MCG/ACT inhaler Inhale 1 puff into the lungs every 6 (six) hours as needed for wheezing or shortness of breath. 6.7 g 1   atorvastatin (LIPITOR) 20 MG tablet Take 1 tablet (20 mg total) by mouth daily.Must have office visit for refills. 30 tablet 0   Blood Glucose Monitoring Suppl (TRUE METRIX METER) w/Device KIT Use as directed 1 kit 0   glucose blood (TRUE METRIX BLOOD GLUCOSE TEST) test strip Use as instructed 100 each 0   hydrALAZINE (APRESOLINE) 50 MG tablet Take 1.5 tablets (75 mg total) by mouth 3 (three) times daily. 405 tablet 0   insulin NPH Human (HUMULIN N) 100 UNIT/ML injection Inject 0.25 mLs (25 Units total) into the skin 2 (two) times daily at 8 am and 10 pm. 30 mL 6   Insulin Syringe-Needle U-100 31G X 15/64" 1 ML MISC Use as directed 100 each 4   losartan (COZAAR) 50 MG tablet Take 1 tablet (50 mg total) by mouth daily. 90 tablet 2   metFORMIN (GLUCOPHAGE-XR) 500 MG 24 hr tablet Take 2 tablets (1,000 mg total) by mouth 2 (two) times daily.Must have office visit for refills. 120 tablet 0   Multiple Vitamin (THERA) TABS Take 2 tablets by mouth daily.     Semaglutide, 2 MG/DOSE, (OZEMPIC, 2 MG/DOSE,) 8 MG/3ML SOPN Inject 2 mg as directed once a week. 3 mL 0   TRUEplus Lancets 28G MISC Use as directed 100 each 4   No current facility-administered medications on file prior to visit.    Allergies  Allergen Reactions   Lisinopril Cough   Toradol [Ketorolac Tromethamine]     SOB, sweating. Tolerates Ibuprofen. Wife reports reaction may have been due to injection.    Social History   Socioeconomic History   Marital  status: Single    Spouse name: Not on file   Number of children: Not on file   Years of education: Not on file   Highest education level: Not on file  Occupational History   Not on file  Tobacco Use   Smoking status: Never   Smokeless tobacco: Current    Types: Snuff  Vaping Use   Vaping status: Never Used  Substance and Sexual Activity   Alcohol use: Yes    Comment: rarely   Drug use: Never   Sexual activity: Not on file  Other Topics Concern   Not on file  Social History Narrative   Not on file   Social Drivers of Health   Financial Resource Strain: Not  on file  Food Insecurity: Food Insecurity Present (10/11/2023)   Hunger Vital Sign    Worried About Running Out of Food in the Last Year: Sometimes true    Ran Out of Food in the Last Year: Sometimes true  Transportation Needs: No Transportation Needs (10/11/2023)   PRAPARE - Administrator, Civil Service (Medical): No    Lack of Transportation (Non-Medical): No  Physical Activity: Not on file  Stress: Not on file  Social Connections: Moderately Integrated (10/11/2023)   Social Connection and Isolation Panel [NHANES]    Frequency of Communication with Friends and Family: More than three times a week    Frequency of Social Gatherings with Friends and Family: Once a week    Attends Religious Services: More than 4 times per year    Active Member of Clubs or Organizations: No    Attends Engineer, structural: More than 4 times per year    Marital Status: Never married  Intimate Partner Violence: Not At Risk (10/11/2023)   Humiliation, Afraid, Rape, and Kick questionnaire    Fear of Current or Ex-Partner: No    Emotionally Abused: No    Physically Abused: No    Sexually Abused: No    Family History  Problem Relation Age of Onset   Diabetes Paternal Grandmother     Past Surgical History:  Procedure Laterality Date   APPLICATION OF WOUND VAC Right 07/17/2019   Procedure: Application Of Wound  Vac;  Surgeon: Myrene Galas, MD;  Location: [facility] OR;  Service: Orthopedics;  Laterality: Right;   APPLICATION OF WOUND VAC Right 07/20/2019   Procedure: Application Of Wound Vac;  Surgeon: Myrene Galas, MD;  Location: [facility] OR;  Service: Orthopedics;  Laterality: Right;   APPLICATION OF WOUND VAC Right 07/27/2019   Procedure: Application Of Wound Vac;  Surgeon: Roby Lofts, MD;  Location: [facility] OR;  Service: Orthopedics;  Laterality: Right;   HERNIA REPAIR     As a baby   I & D EXTREMITY Right 07/27/2019   Procedure: IRRIGATION AND DEBRIDEMENT EXTREMITY Right Leg;  Surgeon: Roby Lofts, MD;  Location: [facility] OR;  Service: Orthopedics;  Laterality: Right;   I & D EXTREMITY Right 07/30/2019   Procedure: IRRIGATION AND DEBRIDEMENT EXTREMITY WITH WOUND VAC CHANGE;  Surgeon: Roby Lofts, MD;  Location: [facility] OR;  Service: Orthopedics;  Laterality: Right;  IRRIGATION AND DEBRIDEMENT EXTREMITY WITH WOUND VAC CHANGE   I & D EXTREMITY Right 08/08/2019   Procedure: IRRIGATION AND DEBRIDEMENT RIGHT EXTREMITY;  Surgeon: Roby Lofts, MD;  Location: [facility] OR;  Service: Orthopedics;  Laterality: Right;   INCISION AND DRAINAGE ABSCESS Right 07/09/2019   Procedure: INCISION AND DRAINAGE ABSCESS RIGHT HIP/PELVIS;  Surgeon: Roby Lofts, MD;  Location: [facility] OR;  Service: Orthopedics;  Laterality: Right;   INCISION AND DRAINAGE HIP Right 07/17/2019   Procedure: IRRIGATION AND DEBRIDEMENT HIP;  Surgeon: Myrene Galas, MD;  Location: [facility] OR;  Service: Orthopedics;  Laterality: Right;   INCISION AND DRAINAGE HIP Right 07/20/2019   Procedure: Irrigation And Debridement Hip;  Surgeon: Myrene Galas, MD;  Location: [facility] OR;  Service: Orthopedics;  Laterality: Right;   TEE WITHOUT CARDIOVERSION N/A 07/11/2019   Procedure: TRANSESOPHAGEAL ECHOCARDIOGRAM (TEE);  Surgeon: Thurmon Fair, MD;  Location: [facility] ENDOSCOPY;  Service: Cardiovascular;  Laterality: N/A;    ROS: Review of Systems Negative except as stated above  PHYSICAL  EXAM: BP 135/83 (BP Location: Left Arm, Patient Position: Sitting, Cuff Size: Large)  Pulse 86   Temp 97.7 F (36.5 C) (Oral)   Ht 6\' 6"  (1.981 m)   Wt (!) 434 lb (196.9 kg)   SpO2 96%   BMI 50.15 kg/m   Physical Exam   General appearance - alert, well appearing, and in no distress Mental status - normal mood, behavior, speech, dress, motor activity, and thought processes Chest - clear to auscultation, no wheezes, rales or rhonchi, symmetric air entry Heart - normal rate, regular rhythm, normal S1, S2, no murmurs, rubs, clicks or gallops Extremities - peripheral pulses normal, no pedal edema, no clubbing or cyanosis     Latest Ref Rng & Units 10/14/2023   11:42 AM 11/26/2022    4:54 PM 07/16/2021    4:38 PM  CMP  Glucose 70 - 99 mg/dL 409  811  914   BUN 6 - 24 mg/dL 9  12  12    Creatinine 0.76 - 1.27 mg/dL 7.82  9.56  2.13   Sodium 134 - 144 mmol/L 142  141  139   Potassium 3.5 - 5.2 mmol/L 4.6  4.5  4.1   Chloride 96 - 106 mmol/L 102  101  97   CO2 20 - 29 mmol/L 24  25  26    Calcium 8.7 - 10.2 mg/dL 9.6  9.8  9.7   Total Protein 6.0 - 8.5 g/dL 7.1  7.5  7.4   Total Bilirubin 0.0 - 1.2 mg/dL 0.4  0.4  0.4   Alkaline Phos 44 - 121 IU/L 72  91  100   AST 0 - 40 IU/L 30  18  35   ALT 0 - 44 IU/L 44  22  50    Lipid Panel     Component Value Date/Time   CHOL 139 10/14/2023 1142   TRIG 165 (H) 10/14/2023 1142   HDL 38 (L) 10/14/2023 1142   CHOLHDL 3.7 10/14/2023 1142   CHOLHDL 6.0 07/28/2019 0553   VLDL 26 07/28/2019 0553   LDLCALC 73 10/14/2023 1142    CBC    Component Value Date/Time   WBC 7.1 10/14/2023 1142   WBC 4.0 08/27/2019 0344   RBC 5.24 10/14/2023 1142   RBC 2.75 (L) 08/27/2019 0344   HGB 16.0 10/14/2023 1142   HCT 47.7 10/14/2023 1142   PLT 193 10/14/2023 1142   MCV 91 10/14/2023 1142   MCH 30.5 10/14/2023 1142   MCH 27.3 08/27/2019 0344   MCHC 33.5 10/14/2023 1142   MCHC 29.1 (L) 08/27/2019 0344   RDW 12.7 10/14/2023 1142   LYMPHSABS 1.5  08/27/2019 0344   MONOABS 0.5 08/27/2019 0344   EOSABS 0.3 08/27/2019 0344   BASOSABS 0.0 08/27/2019 0344    ASSESSMENT AND PLAN: 1. Type 2 diabetes mellitus with morbid obesity (HCC) (Primary) A1c is not at goal. Dietary counseling given.  Advised to cut back on the oranges. Continue metformin 1 g twice a day, Ozempic 2 mg weekly.  Increase morning dose of NPH insulin to 28 units and continue 25 units in the evenings. - POCT glycosylated hemoglobin (Hb A1C) - POCT glucose (manual entry)  2. Hypertension associated with diabetes (HCC) Not at goal.  Increase hydralazine to 100 mg 3 times a day.  We discussed sending the prescription to a different pharmacy since he does not tolerate the pills from the current manufacturer.  He declines.  He will try taking the pills with a tablespoon of yogurt.  Continue Cozaar. - hydrALAZINE (APRESOLINE) 100 MG tablet; Take 1 tablet (100  mg total) by mouth 3 (three) times daily.  Dispense: 270 tablet; Refill: 1  3. Hyperlipidemia associated with type 2 diabetes mellitus (HCC) Continue atorvastatin 20 mg daily  4. Mild major depression (HCC) Patient declines any medication or counseling at this time  5. Other fatigue History not too suggestive of sleep apnea.  However I think we should check a testosterone level and he is agreeable to doing so.  The lab is closed.  He would have to return to the lab to have it done. - Testosterone; Future  6. Need for vaccination against Streptococcus pneumoniae - Pneumococcal conjugate vaccine 20-valent   7. Long-term (current) use of injectable non-insulin antidiabetic drugs 8. Insulin long-term use (HCC) 9. Diabetes mellitus treated with oral medication (HCC) See #1 above.  Patient was given the opportunity to ask questions.  Patient verbalized understanding of the plan and was able to repeat key elements of the plan.   This documentation was completed using Paediatric nurse.  Any  transcriptional errors are unintentional.  Orders Placed This Encounter  Procedures   POCT glycosylated hemoglobin (Hb A1C)   POCT glucose (manual entry)     Requested Prescriptions    No prescriptions requested or ordered in this encounter    No follow-ups on file.  Jonah Blue, MD, FACP

## 2024-02-10 ENCOUNTER — Other Ambulatory Visit: Payer: Self-pay

## 2024-02-17 ENCOUNTER — Other Ambulatory Visit: Payer: Self-pay

## 2024-03-16 ENCOUNTER — Other Ambulatory Visit: Payer: Self-pay

## 2024-03-19 ENCOUNTER — Other Ambulatory Visit: Payer: Self-pay

## 2024-04-09 ENCOUNTER — Other Ambulatory Visit: Payer: Self-pay

## 2024-04-09 ENCOUNTER — Other Ambulatory Visit: Payer: Self-pay | Admitting: Internal Medicine

## 2024-04-09 DIAGNOSIS — Z794 Long term (current) use of insulin: Secondary | ICD-10-CM

## 2024-04-09 DIAGNOSIS — E1169 Type 2 diabetes mellitus with other specified complication: Secondary | ICD-10-CM

## 2024-04-09 MED ORDER — ATORVASTATIN CALCIUM 20 MG PO TABS
20.0000 mg | ORAL_TABLET | Freq: Every day | ORAL | 0 refills | Status: DC
  Filled 2024-04-09: qty 30, 30d supply, fill #0

## 2024-04-09 MED ORDER — METFORMIN HCL ER 500 MG PO TB24
1000.0000 mg | ORAL_TABLET | Freq: Two times a day (BID) | ORAL | 0 refills | Status: DC
  Filled 2024-04-09: qty 120, 30d supply, fill #0

## 2024-04-09 MED ORDER — OZEMPIC (2 MG/DOSE) 8 MG/3ML ~~LOC~~ SOPN
2.0000 mg | PEN_INJECTOR | SUBCUTANEOUS | 0 refills | Status: DC
  Filled 2024-04-09: qty 3, 28d supply, fill #0

## 2024-04-11 ENCOUNTER — Other Ambulatory Visit: Payer: Self-pay

## 2024-04-17 ENCOUNTER — Other Ambulatory Visit: Payer: Self-pay

## 2024-04-18 ENCOUNTER — Other Ambulatory Visit: Payer: Self-pay

## 2024-05-07 ENCOUNTER — Ambulatory Visit: Payer: Self-pay

## 2024-05-07 NOTE — Telephone Encounter (Signed)
 FYI Only or Action Required?: FYI only for provider.  Patient was last seen in primary care on 02/09/2024 by Vicci Barnie NOVAK, MD. Called Nurse Triage reporting Hyperglycemia. Symptoms began several weeks ago. Interventions attempted: Prescription medications: metformin  and weekly ozempic . Symptoms are: gradually worsening.  Triage Disposition: Go to ED Now (Notify PCP)  Patient/caregiver understands and will follow disposition?:   Copied from CRM [id number]. Topic: Clinical - Red Word Triage >> May 07, 2024  9:47 AM Ivette P wrote: [facility] that prompted transfer to Nurse Triage: blood sugar has been up to 300, needs insulin  novalog or novalin Reason for Disposition  [1] Vomiting AND [2] signs of dehydration (e.g., very dry mouth, lightheaded, dark urine)  Answer Assessment - Initial Assessment Questions Tried to get a Rx refill but they did not give him his insulin , only metformin  and osempic for past week.  - pt currently has s/s of hyperglycemia and has for past few days.  - pt sent to ED  1. BLOOD GLUCOSE: What is your blood glucose level?      Almost in 300s.  277-290 for past 2 weeks 2. ONSET: When did you check the blood glucose?     This morning - 277 Past week 3. USUAL RANGE: What is your glucose level usually? (e.g., usual fasting morning value, usual evening value)     Testing at home -  4. KETONES: Do you check for ketones (urine or blood test strips)? If Yes, ask: What does the test show now?      no 5. TYPE 1 or 2:  Do you know what type of diabetes you have?  (e.g., Type 1, Type 2, Gestational; doesn't know)      Type 2 6. INSULIN : Do you take insulin ? What type of insulin (s) do you use? What is the mode of delivery? (syringe, pen; injection or pump)?      Has not been on insulin  for 2 weeks.  7. DIABETES PILLS: Do you take any pills for your diabetes? If Yes, ask: Have you missed taking any pills recently?     Metformin  for past 2 weeks and one  ozempic  injection weekly 2 mg 8. OTHER SYMPTOMS: Do you have any symptoms? (e.g., fever, frequent urination, difficulty breathing, dizziness, weakness, vomiting)     Dizzy, headaches, tired, diff breathing, freq urination, vomiting, cannot eat all wknd,  Protocols used: Diabetes - High Blood Sugar-A-AH

## 2024-05-09 NOTE — Telephone Encounter (Signed)
 This is not a pt of [facility] will send to correct provider

## 2024-05-09 NOTE — Telephone Encounter (Signed)
 Give appt with any available provider.

## 2024-05-15 ENCOUNTER — Other Ambulatory Visit: Payer: Self-pay

## 2024-05-15 ENCOUNTER — Other Ambulatory Visit: Payer: Self-pay | Admitting: Internal Medicine

## 2024-05-15 ENCOUNTER — Ambulatory Visit: Payer: Self-pay | Attending: Internal Medicine | Admitting: Internal Medicine

## 2024-05-15 ENCOUNTER — Encounter: Payer: Self-pay | Admitting: Internal Medicine

## 2024-05-15 VITALS — BP 122/78 | HR 91 | Ht 78.0 in | Wt >= 6400 oz

## 2024-05-15 DIAGNOSIS — E1169 Type 2 diabetes mellitus with other specified complication: Secondary | ICD-10-CM

## 2024-05-15 DIAGNOSIS — E785 Hyperlipidemia, unspecified: Secondary | ICD-10-CM

## 2024-05-15 DIAGNOSIS — Z7985 Long-term (current) use of injectable non-insulin antidiabetic drugs: Secondary | ICD-10-CM

## 2024-05-15 DIAGNOSIS — E1159 Type 2 diabetes mellitus with other circulatory complications: Secondary | ICD-10-CM

## 2024-05-15 DIAGNOSIS — Z794 Long term (current) use of insulin: Secondary | ICD-10-CM

## 2024-05-15 DIAGNOSIS — Z7984 Long term (current) use of oral hypoglycemic drugs: Secondary | ICD-10-CM

## 2024-05-15 DIAGNOSIS — Z9181 History of falling: Secondary | ICD-10-CM

## 2024-05-15 DIAGNOSIS — I10 Essential (primary) hypertension: Secondary | ICD-10-CM

## 2024-05-15 DIAGNOSIS — E119 Type 2 diabetes mellitus without complications: Secondary | ICD-10-CM

## 2024-05-15 DIAGNOSIS — E1165 Type 2 diabetes mellitus with hyperglycemia: Secondary | ICD-10-CM

## 2024-05-15 LAB — POCT GLYCOSYLATED HEMOGLOBIN (HGB A1C): HbA1c, POC (controlled diabetic range): 9.8 % — AB (ref 0.0–7.0)

## 2024-05-15 LAB — GLUCOSE, POCT (MANUAL RESULT ENTRY): POC Glucose: 287 mg/dL — AB (ref 70–99)

## 2024-05-15 MED ORDER — ATORVASTATIN CALCIUM 20 MG PO TABS
20.0000 mg | ORAL_TABLET | Freq: Every day | ORAL | 2 refills | Status: AC
  Filled 2024-05-15: qty 30, 30d supply, fill #0
  Filled 2024-09-13: qty 30, 30d supply, fill #1

## 2024-05-15 MED ORDER — OZEMPIC (2 MG/DOSE) 8 MG/3ML ~~LOC~~ SOPN
2.0000 mg | PEN_INJECTOR | SUBCUTANEOUS | 6 refills | Status: AC
  Filled 2024-05-15: qty 3, 28d supply, fill #0
  Filled 2024-07-16: qty 3, 28d supply, fill #1

## 2024-05-15 MED ORDER — METFORMIN HCL ER 500 MG PO TB24
1000.0000 mg | ORAL_TABLET | Freq: Two times a day (BID) | ORAL | 2 refills | Status: AC
  Filled 2024-05-15: qty 120, 30d supply, fill #0
  Filled 2024-07-16: qty 120, 30d supply, fill #1
  Filled 2024-09-13: qty 120, 30d supply, fill #2
  Filled 2024-11-29: qty 120, 30d supply, fill #3

## 2024-05-15 NOTE — Patient Instructions (Signed)
 VISIT SUMMARY:  Today, we discussed your uncontrolled blood sugars, elevated blood pressure, knee pain, and the healing of your surgical site from basal cell carcinoma removal. We reviewed your current medications and made adjustments to help manage your conditions more effectively.  YOUR PLAN:  -UNCONTROLLED TYPE 2 DIABETES MELLITUS: Your blood sugar levels have been higher than desired, and your A1c has increased to 9.8%. This is likely due to the interruption in your insulin  regimen. We will resolve the prescription refill issue for your NPH insulin  and reinstate your dosage to 28 units in the morning and 25 units in the evening.  -HYPERTENSION: Your blood pressure was slightly elevated at 148/92 mmHg. This could be due to stress and using an improperly fitting blood pressure cuff. We recommend rechecking your blood pressure with a large cuff and obtaining a properly fitting cuff for home monitoring.  -KNEE PAIN: You have been experiencing knee pain following a fall and hyperextension incident. Continue using ice and topical treatments to manage the pain.  -BASAL CELL CARCINOMA (POST-SURGICAL): The surgical site from your basal cell carcinoma removal is healing well, although you still have some numbness. Continue to monitor the site for any changes.  INSTRUCTIONS:  Please ensure you get your prescription for NPH insulin  refilled and resume taking it as directed. Obtain a properly fitting blood pressure cuff for accurate home monitoring. Follow up with us  if your blood sugar levels or blood pressure remain uncontrolled, or if your knee pain worsens. Continue to monitor the surgical site on your nose for any changes.

## 2024-05-15 NOTE — Progress Notes (Signed)
 Patient ID: Bradley Cox, male    DOB: 03/14/1971  MRN: [id number]  CC: Diabetes (DM f/u. Med refill. /Urinary frequency, fatigue /Discuss Novolin refill/Yes to Hep B vax)   Subjective: Bradley Cox is a 53 y.o. male who presents for chronic ds management. His concerns today include:  Patient with history of DM type II with neuropathy, morbid obesity, HTN,  HL, DVT right leg, asthma. MSSA bacteremia, depression, basal cell CA (Mohs procedure 01/2024).    Discussed the use of AI scribe software for clinical note transcription with the patient, who gave verbal consent to proceed.  History of Present Illness Bradley Cox is a 53 year old male with diabetes who presents with uncontrolled blood sugars.  DM: Results for orders placed or performed in visit on 05/15/24  POCT glucose (manual entry)   Collection Time: 05/15/24 10:55 AM  Result Value Ref Range   POC Glucose 287 (A) 70 - 99 mg/dl  POCT glycosylated hemoglobin (Hb A1C)   Collection Time: 05/15/24 11:10 AM  Result Value Ref Range   Hemoglobin A1C     HbA1c POC (<> result, manual entry)     HbA1c, POC (prediabetic range)     HbA1c, POC (controlled diabetic range) 9.8 (A) 0.0 - 7.0 %  His blood sugars have been fluctuating between 270 and 290 mg/dL before and after meals. This morning, his blood sugar was 232 mg/dL before leaving home and 287 mg/dL upon arrival at the clinic. His A1c has increased from 7.6% in April to 9.8% currently.  He has been on Ozempic  2 mg once a week, Metformin  1000 mg twice a day, and NPH insulin  28 units in the morning and 25 units in the evening. However, he has been without NPH insulin  for about a month due to a prescription refill issue, despite having six refills available on last rxn written on last visit 01/2024. During this time, he used two leftover NovoLog  pens, which are now depleted.  He experiences increased thirst, frequent urination, and significant fatigue. He has had falls,  including one yesterday while carrying groceries, and another incident three weeks ago where he hyperextended his knee. He has been using ice, thermal spray, and topical numbing cream for knee pain.  In the fall that occurred yesterday, he missed his step because he was not looking down while carrying groceries in his hand.  His blood pressure today was 148/92 mmHg, despite taking Cozaar  and hydralazine  this morning. He does not have a properly fitting blood pressure cuff at home, which affects his ability to monitor his blood pressure accurately.  HL: He needs refill on the atorvastatin  which he is taking consistently.  He mentions a history of basal cell carcinoma on his nose, which was removed on April 16th via Mohs surgery. The site is healing well, though there is still some numbness.    Patient Active Problem List   Diagnosis Date Noted   Lesion of skin of nose 07/01/2020   Primary insomnia 02/29/2020   Obesity (BMI 30-39.9) 10/20/2019   Drug induced constipation    Uncontrolled type 2 diabetes mellitus with hyperglycemia (HCC)    Acute blood loss anemia    Postoperative pain    Abscess of right hip 07/09/2019   Compression of right sciatic nerve 07/09/2019   Right elbow pain 06/17/2015   Cavitary lesion of lung 06/16/2015   Patent foramen ovale 06/13/2015   Sepsis (HCC) 06/11/2015   Bacteremia due to Staphylococcus aureus 06/11/2015  Hyponatremia 06/11/2015   Asthma 06/11/2015   Pleuritic chest pain 06/11/2015   Pneumonia due to infectious organism 06/11/2015   Type 2 diabetes mellitus with hyperglycemia (HCC) 06/11/2015   Morbid obesity (HCC) 01/21/2009   GERD 01/21/2009   DYSPHAGIA 01/21/2009   HTN (hypertension) 01/07/2009   Asthma 01/07/2009     Current Outpatient Medications on File Prior to Visit  Medication Sig Dispense Refill   acetaminophen  (TYLENOL ) 325 MG tablet Take 2 tablets (650 mg total) by mouth every 6 (six) hours as needed for mild pain.      albuterol  (PROVENTIL  HFA) 108 (90 Base) MCG/ACT inhaler Inhale 1 puff into the lungs every 6 (six) hours as needed for wheezing or shortness of breath. 6.7 g 1   Blood Glucose Monitoring Suppl (TRUE METRIX METER) w/Device KIT Use as directed 1 kit 0   glucose blood (TRUE METRIX BLOOD GLUCOSE TEST) test strip Use as instructed 100 each 0   hydrALAZINE  (APRESOLINE ) 100 MG tablet Take 1 tablet (100 mg total) by mouth 3 (three) times daily. 270 tablet 1   Insulin  Syringe-Needle U-100 31G X 15/64 1 ML MISC Use as directed 100 each 4   losartan  (COZAAR ) 50 MG tablet Take 1 tablet (50 mg total) by mouth daily. 90 tablet 2   Multiple Vitamin (THERA) TABS Take 2 tablets by mouth daily.     TRUEplus Lancets 28G MISC Use as directed 100 each 4   atorvastatin  (LIPITOR) 20 MG tablet Take 1 tablet (20 mg total) by mouth daily.Must have office visit for refills. 90 tablet 0   insulin  NPH Human (HUMULIN  N) 100 UNIT/ML injection Inject 0.28 mLs (28 Units total) into the skin every morning AND 0.25 mLs (25 Units total) daily in the afternoon. 30 mL 6   metFORMIN  (GLUCOPHAGE -XR) 500 MG 24 hr tablet Take 2 tablets (1,000 mg total) by mouth 2 (two) times daily.Must have office visit for refills. 120 tablet 0   Semaglutide , 2 MG/DOSE, (OZEMPIC , 2 MG/DOSE,) 8 MG/3ML SOPN Inject 2 mg as directed once a week. 3 mL 0   No current facility-administered medications on file prior to visit.    Allergies  Allergen Reactions   Lisinopril Cough   Toradol  [Ketorolac  Tromethamine ]     SOB, sweating. Tolerates Ibuprofen. Wife reports reaction may have been due to injection.    Social History   Socioeconomic History   Marital status: Single    Spouse name: Not on file   Number of children: Not on file   Years of education: Not on file   Highest education level: Not on file  Occupational History   Not on file  Tobacco Use   Smoking status: Never   Smokeless tobacco: Current    Types: Snuff  Vaping Use   Vaping  status: Never Used  Substance and Sexual Activity   Alcohol use: Yes    Comment: rarely   Drug use: Never   Sexual activity: Not on file  Other Topics Concern   Not on file  Social History Narrative   Not on file   Social Drivers of Health   Financial Resource Strain: Low Risk  (02/09/2024)   Overall Financial Resource Strain (CARDIA)    Difficulty of Paying Living Expenses: Not very hard  Food Insecurity: No Food Insecurity (02/09/2024)   Hunger Vital Sign    Worried About Running Out of Food in the Last Year: Never true    Ran Out of Food in the Last Year: Never true  Transportation Needs: No Transportation Needs (02/09/2024)   PRAPARE - Administrator, Civil Service (Medical): No    Lack of Transportation (Non-Medical): No  Physical Activity: Inactive (02/09/2024)   Exercise Vital Sign    Days of Exercise per Week: 0 days    Minutes of Exercise per Session: 0 min  Stress: Stress Concern Present (02/09/2024)   [employer] of Occupational Health - Occupational Stress Questionnaire    Feeling of Stress : To some extent  Social Connections: Socially Isolated (02/09/2024)   Social Connection and Isolation Panel    Frequency of Communication with Friends and Family: Once a week    Frequency of Social Gatherings with Friends and Family: Once a week    Attends Religious Services: Never    Database administrator or Organizations: No    Attends Banker Meetings: Never    Marital Status: Divorced  Catering manager Violence: Not At Risk (02/09/2024)   Humiliation, Afraid, Rape, and Kick questionnaire    Fear of Current or Ex-Partner: No    Emotionally Abused: No    Physically Abused: No    Sexually Abused: No    Family History  Problem Relation Age of Onset   Diabetes Paternal Grandmother     Past Surgical History:  Procedure Laterality Date   APPLICATION OF WOUND VAC Right 07/17/2019   Procedure: Application Of Wound Vac;  Surgeon: Celena Sharper,  MD;  Location: [facility] OR;  Service: Orthopedics;  Laterality: Right;   APPLICATION OF WOUND VAC Right 07/20/2019   Procedure: Application Of Wound Vac;  Surgeon: Celena Sharper, MD;  Location: [facility] OR;  Service: Orthopedics;  Laterality: Right;   APPLICATION OF WOUND VAC Right 07/27/2019   Procedure: Application Of Wound Vac;  Surgeon: Kendal Franky SQUIBB, MD;  Location: [facility] OR;  Service: Orthopedics;  Laterality: Right;   HERNIA REPAIR     As a baby   I & D EXTREMITY Right 07/27/2019   Procedure: IRRIGATION AND DEBRIDEMENT EXTREMITY Right Leg;  Surgeon: Kendal Franky SQUIBB, MD;  Location: [facility] OR;  Service: Orthopedics;  Laterality: Right;   I & D EXTREMITY Right 07/30/2019   Procedure: IRRIGATION AND DEBRIDEMENT EXTREMITY WITH WOUND VAC CHANGE;  Surgeon: Kendal Franky SQUIBB, MD;  Location: [facility] OR;  Service: Orthopedics;  Laterality: Right;  IRRIGATION AND DEBRIDEMENT EXTREMITY WITH WOUND VAC CHANGE   I & D EXTREMITY Right 08/08/2019   Procedure: IRRIGATION AND DEBRIDEMENT RIGHT EXTREMITY;  Surgeon: Kendal Franky SQUIBB, MD;  Location: [facility] OR;  Service: Orthopedics;  Laterality: Right;   INCISION AND DRAINAGE ABSCESS Right 07/09/2019   Procedure: INCISION AND DRAINAGE ABSCESS RIGHT HIP/PELVIS;  Surgeon: Kendal Franky SQUIBB, MD;  Location: [facility] OR;  Service: Orthopedics;  Laterality: Right;   INCISION AND DRAINAGE HIP Right 07/17/2019   Procedure: IRRIGATION AND DEBRIDEMENT HIP;  Surgeon: Celena Sharper, MD;  Location: [facility] OR;  Service: Orthopedics;  Laterality: Right;   INCISION AND DRAINAGE HIP Right 07/20/2019   Procedure: Irrigation And Debridement Hip;  Surgeon: Celena Sharper, MD;  Location: [facility] OR;  Service: Orthopedics;  Laterality: Right;   TEE WITHOUT CARDIOVERSION N/A 07/11/2019   Procedure: TRANSESOPHAGEAL ECHOCARDIOGRAM (TEE);  Surgeon: Francyne Headland, MD;  Location: [facility] ENDOSCOPY;  Service: Cardiovascular;  Laterality: N/A;    ROS: Review of Systems Negative except as stated above  PHYSICAL EXAM: BP (!) 148/92 (BP  Location: Left Arm, Patient Position: Sitting, Cuff Size: Large)   Pulse 91   Ht 6' 6 (1.981 m)  Wt (!) 428 lb (194.1 kg)   SpO2 93%   BMI 49.46 kg/m   Physical Exam   General appearance - alert, well appearing, and in no distress Mental status - normal mood, behavior, speech, dress, motor activity, and thought processes Chest - clear to auscultation, no wheezes, rales or rhonchi, symmetric air entry Heart - normal rate, regular rhythm, normal S1, S2, no murmurs, rubs, clicks or gallops     Latest Ref Rng & Units 10/14/2023   11:42 AM 11/26/2022    4:54 PM 07/16/2021    4:38 PM  CMP  Glucose 70 - 99 mg/dL 859  889  769   BUN 6 - 24 mg/dL 9  12  12    Creatinine 0.76 - 1.27 mg/dL 9.10  9.08  9.18   Sodium 134 - 144 mmol/L 142  141  139   Potassium 3.5 - 5.2 mmol/L 4.6  4.5  4.1   Chloride 96 - 106 mmol/L 102  101  97   CO2 20 - 29 mmol/L 24  25  26    Calcium  8.7 - 10.2 mg/dL 9.6  9.8  9.7   Total Protein 6.0 - 8.5 g/dL 7.1  7.5  7.4   Total Bilirubin 0.0 - 1.2 mg/dL 0.4  0.4  0.4   Alkaline Phos 44 - 121 IU/L 72  91  100   AST 0 - 40 IU/L 30  18  35   ALT 0 - 44 IU/L 44  22  50    Lipid Panel     Component Value Date/Time   CHOL 139 10/14/2023 1142   TRIG 165 (H) 10/14/2023 1142   HDL 38 (L) 10/14/2023 1142   CHOLHDL 3.7 10/14/2023 1142   CHOLHDL 6.0 07/28/2019 0553   VLDL 26 07/28/2019 0553   LDLCALC 73 10/14/2023 1142    CBC    Component Value Date/Time   WBC 7.1 10/14/2023 1142   WBC 4.0 08/27/2019 0344   RBC 5.24 10/14/2023 1142   RBC 2.75 (L) 08/27/2019 0344   HGB 16.0 10/14/2023 1142   HCT 47.7 10/14/2023 1142   PLT 193 10/14/2023 1142   MCV 91 10/14/2023 1142   MCH 30.5 10/14/2023 1142   MCH 27.3 08/27/2019 0344   MCHC 33.5 10/14/2023 1142   MCHC 29.1 (L) 08/27/2019 0344   RDW 12.7 10/14/2023 1142   LYMPHSABS 1.5 08/27/2019 0344   MONOABS 0.5 08/27/2019 0344   EOSABS 0.3 08/27/2019 0344   BASOSABS 0.0 08/27/2019 0344    ASSESSMENT AND  PLAN: 1. Type 2 diabetes mellitus with hyperglycemia, with long-term current use of insulin  (HCC) (Primary) Not at goal.  He has been out of NPH insulin  for a month despite still having refills on the last prescription.  I spoke with our clinical pharmacist to correct this issue for him.  He will pick up the NPH today.  Continue metformin  and Ozempic . - POCT glycosylated hemoglobin (Hb A1C) - POCT glucose (manual entry) - metFORMIN  (GLUCOPHAGE -XR) 500 MG 24 hr tablet; Take 2 tablets (1,000 mg total) by mouth 2 (two) times daily.  Dispense: 180 tablet; Refill: 2 - Semaglutide , 2 MG/DOSE, (OZEMPIC , 2 MG/DOSE,) 8 MG/3ML SOPN; Inject 2 mg as directed once a week.  Dispense: 3 mL; Refill: 6  2. Long-term (current) use of injectable non-insulin  antidiabetic drugs 3. Diabetes mellitus treated with oral medication (HCC) See #1 above.  4. Hyperlipidemia associated with type 2 diabetes mellitus (HCC) - atorvastatin  (LIPITOR) 20 MG tablet; Take 1 tablet (20  mg total) by mouth daily.Must have office visit for refills.  Dispense: 90 tablet; Refill: 2  5. Hypertension associated with diabetes (HCC) Repeat blood pressure at goal.  He will continue Cozaar  and hydralazine   6. History of recent fall Advised patient to be careful especially when carrying bags in his hand that may obscure placement of his feet.    Patient was given the opportunity to ask questions.  Patient verbalized understanding of the plan and was able to repeat key elements of the plan.   This documentation was completed using Paediatric nurse.  Any transcriptional errors are unintentional.  Orders Placed This Encounter  Procedures   POCT glycosylated hemoglobin (Hb A1C)   POCT glucose (manual entry)     Requested Prescriptions   Pending Prescriptions Disp Refills   atorvastatin  (LIPITOR) 20 MG tablet 90 tablet 1    Sig: Take 1 tablet (20 mg total) by mouth daily.Must have office visit for refills.    metFORMIN  (GLUCOPHAGE -XR) 500 MG 24 hr tablet 180 tablet 1    Sig: Take 2 tablets (1,000 mg total) by mouth 2 (two) times daily.Must have office visit for refills.   Semaglutide , 2 MG/DOSE, (OZEMPIC , 2 MG/DOSE,) 8 MG/3ML SOPN 3 mL 0    Sig: Inject 2 mg as directed once a week.    No follow-ups on file.  Barnie Louder, MD, FACP

## 2024-05-16 ENCOUNTER — Other Ambulatory Visit: Payer: Self-pay

## 2024-05-17 ENCOUNTER — Other Ambulatory Visit: Payer: Self-pay

## 2024-05-31 ENCOUNTER — Other Ambulatory Visit: Payer: Self-pay

## 2024-06-01 ENCOUNTER — Other Ambulatory Visit: Payer: Self-pay

## 2024-06-15 ENCOUNTER — Ambulatory Visit: Payer: Self-pay | Admitting: Internal Medicine

## 2024-06-25 ENCOUNTER — Other Ambulatory Visit: Payer: Self-pay

## 2024-06-26 ENCOUNTER — Other Ambulatory Visit (HOSPITAL_COMMUNITY): Payer: Self-pay

## 2024-06-26 MED ORDER — TRIAMCINOLONE ACETONIDE 0.1 % EX CREA
TOPICAL_CREAM | CUTANEOUS | 3 refills | Status: AC
  Filled 2024-06-26 – 2024-07-04 (×2): qty 80, 30d supply, fill #0

## 2024-07-04 ENCOUNTER — Other Ambulatory Visit: Payer: Self-pay

## 2024-07-04 ENCOUNTER — Other Ambulatory Visit (HOSPITAL_COMMUNITY): Payer: Self-pay

## 2024-07-16 ENCOUNTER — Other Ambulatory Visit (HOSPITAL_COMMUNITY): Payer: Self-pay

## 2024-07-16 ENCOUNTER — Other Ambulatory Visit: Payer: Self-pay

## 2024-07-17 ENCOUNTER — Other Ambulatory Visit (HOSPITAL_COMMUNITY): Payer: Self-pay

## 2024-07-19 ENCOUNTER — Other Ambulatory Visit: Payer: Self-pay

## 2024-09-13 ENCOUNTER — Other Ambulatory Visit: Payer: Self-pay

## 2024-09-17 ENCOUNTER — Ambulatory Visit: Payer: Self-pay | Admitting: Internal Medicine

## 2024-10-16 ENCOUNTER — Encounter: Payer: Self-pay | Admitting: Internal Medicine

## 2024-10-16 ENCOUNTER — Ambulatory Visit: Payer: Self-pay | Attending: Internal Medicine | Admitting: Internal Medicine

## 2024-10-16 ENCOUNTER — Other Ambulatory Visit: Payer: Self-pay

## 2024-10-16 DIAGNOSIS — I152 Hypertension secondary to endocrine disorders: Secondary | ICD-10-CM

## 2024-10-16 DIAGNOSIS — E119 Type 2 diabetes mellitus without complications: Secondary | ICD-10-CM

## 2024-10-16 DIAGNOSIS — E785 Hyperlipidemia, unspecified: Secondary | ICD-10-CM

## 2024-10-16 DIAGNOSIS — E1169 Type 2 diabetes mellitus with other specified complication: Secondary | ICD-10-CM

## 2024-10-16 DIAGNOSIS — Z7985 Long-term (current) use of injectable non-insulin antidiabetic drugs: Secondary | ICD-10-CM

## 2024-10-16 DIAGNOSIS — E1159 Type 2 diabetes mellitus with other circulatory complications: Secondary | ICD-10-CM

## 2024-10-16 DIAGNOSIS — Z7984 Long term (current) use of oral hypoglycemic drugs: Secondary | ICD-10-CM

## 2024-10-16 DIAGNOSIS — Z23 Encounter for immunization: Secondary | ICD-10-CM

## 2024-10-16 DIAGNOSIS — I10 Essential (primary) hypertension: Secondary | ICD-10-CM

## 2024-10-16 DIAGNOSIS — Z794 Long term (current) use of insulin: Secondary | ICD-10-CM

## 2024-10-16 LAB — POCT GLYCOSYLATED HEMOGLOBIN (HGB A1C): HbA1c, POC (controlled diabetic range): 10.7 % — AB (ref 0.0–7.0)

## 2024-10-16 LAB — GLUCOSE, POCT (MANUAL RESULT ENTRY): POC Glucose: 310 mg/dL — AB (ref 70–99)

## 2024-10-16 MED ORDER — LOSARTAN POTASSIUM 50 MG PO TABS
50.0000 mg | ORAL_TABLET | Freq: Every day | ORAL | 2 refills | Status: AC
  Filled 2024-10-16: qty 90, 90d supply, fill #0

## 2024-10-16 MED ORDER — NOVOLOG FLEXPEN 100 UNIT/ML ~~LOC~~ SOPN
5.0000 [IU] | PEN_INJECTOR | Freq: Two times a day (BID) | SUBCUTANEOUS | 11 refills | Status: DC
  Filled 2024-10-16: qty 3, 28d supply, fill #0

## 2024-10-16 MED ORDER — LANTUS SOLOSTAR 100 UNIT/ML ~~LOC~~ SOPN
PEN_INJECTOR | SUBCUTANEOUS | 11 refills | Status: DC
  Filled 2024-10-16: qty 15, 30d supply, fill #0

## 2024-10-16 MED ORDER — TRUE METRIX BLOOD GLUCOSE TEST VI STRP
ORAL_STRIP | 6 refills | Status: AC
  Filled 2024-10-16: qty 100, 25d supply, fill #0

## 2024-10-16 MED ORDER — HYDRALAZINE HCL 100 MG PO TABS
100.0000 mg | ORAL_TABLET | Freq: Three times a day (TID) | ORAL | 1 refills | Status: DC
  Filled 2024-10-16: qty 90, 30d supply, fill #0

## 2024-10-16 NOTE — Patient Instructions (Signed)
°  VISIT SUMMARY: You had a follow-up appointment to manage your diabetes, hypertension, and hyperlipidemia. We discussed your current challenges with blood sugar control, weight gain, and neuropathy symptoms. Your blood pressure and cholesterol management were also reviewed. You received a flu shot and discussed plans for an upcoming eye exam.  YOUR PLAN: -TYPE 2 DIABETES MELLITUS WITH POOR GLYCEMIC CONTROL: Your blood sugar levels are not well controlled, with an A1c of 10.7 and a current blood sugar of 310 mg/dL. We have switched your insulin  to Lantus  45 units at bedtime and added Novolog  5 units with lunch and dinner. If your morning blood sugars remain over 150 mg/dL after 3 days, increase Lantus  to 50 units. Follow up with the clinical pharmacist in one month.  -MORBID OBESITY: You have gained weight despite making dietary changes. Continue with your dietary modifications and weight loss efforts. Ozempic  may not be effective for you.  -HYPERTENSION: Your blood pressure is 150/95 mmHg, and you have not been taking your afternoon dose of hydralazine . Please ensure you take this dose as prescribed. Follow up with the clinical pharmacist in one month.  -HYPERLIPIDEMIA: You are overdue for cholesterol, kidney, liver function tests, and a urine test for protein. These tests have been ordered to monitor your condition.  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 If these symptoms persist, we may consider medications like Lyrica or Cymbalta .  -HISTORY OF BASAL CELL CARCINOMA OF NOSE: You had a Mohs procedure for basal cell carcinoma on your nose in April 2025. Continue to monitor for any changes or new symptoms.  -GENERAL HEALTH MAINTENANCE: You received a flu shot today. Please ensure you have your eye exam and update your health records with the results.  INSTRUCTIONS: Follow up with the clinical pharmacist in one  month for diabetes and hypertension management. Ensure you complete the ordered tests for cholesterol, kidney, liver function, and urine protein. Attend your scheduled eye exam in January and update your health records with the results.                      Contains text generated by [employer].                                 Contains text generated by [employer].

## 2024-10-16 NOTE — Progress Notes (Unsigned)
 Patient ID: Bradley Cox, male    DOB: 19-Feb-1971  MRN: [id number]  CC: Diabetes (DM f/u. Thompson Ozempic  - reports no chnge in BS readings & weight /Bilateral knee & foot pain - concern about lack of weight loss increasing pain/Flue vax administered n 10/16/24 - C.A.)   Subjective: Bradley Cox is a 53 y.o. male who presents for chronic ds management. His concerns today include:  Patient with history of DM type II with neuropathy, morbid obesity, HTN,  HL, DVT right leg, asthma. MSSA bacteremia, depression, basal cell CA (Mohs procedure 01/2024)   Discussed the use of AI scribe software for clinical note transcription with the patient, who gave verbal consent to proceed.  History of Present Illness Bradley Cox is a 53 year old male with diabetes, hypertension, and hyperlipidemia who presents for follow-up of his chronic conditions.  He is experiencing challenges in managing his diabetes, with his A1c increasing to 10.7 from 9.8 previously. His current blood sugar is 310 mg/dL. He is on Ozempic  2 mg, NPH insulin  28 units in the morning and 25 in the evening, and metformin  1000 mg twice daily. He feels that Ozempic  is not effective and reports weight gain despite dietary changes. He checks his blood sugar once daily, usually in the morning, and it remains over 200 mg/dL.  He has gained 8 pounds despite cutting back on junk food, increasing protein intake, and avoiding soda. He drinks one cup of coffee in the morning and primarily drinks water throughout the day. He Cox his meals at home, focusing on protein and vegetables, and does not eat out.  He describes 'razor blade' sensations in his feet, particularly at night, lasting about 10 seconds. He has a history of gabapentin  use, which was ineffective.  His blood pressure was recorded at 147/89 mmHg, and he is on losartan  50 mg daily and hydralazine  100 mg three times daily. He has not been checking his blood pressure at home due to  an ill-fitting cuff. He limits his salt intake and has been doing so for 20 years.  He is on atorvastatin  for cholesterol management. He received a flu shot during this visit. He plans to have an eye exam in January, as his current glasses are two years old. He had a diabetic eye check earlier this year, which showed no issues.  He underwent a Mohs procedure for basal cell carcinoma on his nose in April 2025, performed by Dr. Rockey Hind at [facility]. He has had three follow-up visits with no further issues.  His father is also diabetic and experiences similar symptoms.  He is currently unemployed and seeking employment, which affects his ability to obtain health insurance. 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    Patient Active Problem List   Diagnosis Date Noted   Lesion of skin of nose 07/01/2020   Primary insomnia 02/29/2020   Obesity (BMI 30-39.9) 10/20/2019   Drug induced constipation    Uncontrolled type 2 diabetes mellitus with hyperglycemia (HCC)    Acute blood loss anemia    Postoperative pain    Abscess of right hip 07/09/2019   Compression of right sciatic nerve 07/09/2019   Right elbow pain 06/17/2015   Cavitary lesion of lung 06/16/2015   Patent foramen ovale 06/13/2015   Sepsis (HCC) 06/11/2015   Bacteremia due to Staphylococcus aureus 06/11/2015   Hyponatremia 06/11/2015   Asthma 06/11/2015   Pleuritic chest  pain 06/11/2015   Pneumonia due to infectious organism 06/11/2015   Type 2 diabetes mellitus with hyperglycemia (HCC) 06/11/2015   Morbid obesity (HCC) 01/21/2009   GERD 01/21/2009   DYSPHAGIA 01/21/2009   HTN (hypertension) 01/07/2009   Asthma 01/07/2009     Medications Ordered Prior to Encounter[1]  Allergies[2]  Social History   Socioeconomic History   Marital status: Single    Spouse name: Not on file   Number of children: Not on file   Years of education: Not on file   Highest  education level: Not on file  Occupational History   Not on file  Tobacco Use   Smoking status: Never   Smokeless tobacco: Current    Types: Snuff  Vaping Use   Vaping status: Never Used  Substance and Sexual Activity   Alcohol use: Yes    Comment: rarely   Drug use: Never   Sexual activity: Not on file  Other Topics Concern   Not on file  Social History Narrative   Not on file   Social Drivers of Health   Tobacco Use: High Risk (05/15/2024)   Patient History    Smoking Tobacco Use: Never    Smokeless Tobacco Use: Current    Passive Exposure: Not on file  Financial Resource Strain: Low Risk (02/09/2024)   Overall Financial Resource Strain (CARDIA)    Difficulty of Paying Living Expenses: Not very hard  Food Insecurity: No Food Insecurity (02/09/2024)   Hunger Vital Sign    Worried About Running Out of Food in the Last Year: Never true    Ran Out of Food in the Last Year: Never true  Transportation Needs: No Transportation Needs (02/09/2024)   PRAPARE - Administrator, Civil Service (Medical): No    Lack of Transportation (Non-Medical): No  Physical Activity: Inactive (02/09/2024)   Exercise Vital Sign    Days of Exercise per Week: 0 days    Minutes of Exercise per Session: 0 min  Stress: Stress Concern Present (02/09/2024)   [employer] of Occupational Health - Occupational Stress Questionnaire    Feeling of Stress : To some extent  Social Connections: Socially Isolated (02/09/2024)   Social Connection and Isolation Panel    Frequency of Communication with Friends and Family: Once a week    Frequency of Social Gatherings with Friends and Family: Once a week    Attends Religious Services: Never    Database Administrator or Organizations: No    Attends Banker Meetings: Never    Marital Status: Divorced  Catering Manager Violence: Not At Risk (02/09/2024)   Humiliation, Afraid, Rape, and Kick questionnaire    Fear of Current or Ex-Partner: No     Emotionally Abused: No    Physically Abused: No    Sexually Abused: No  Depression (PHQ2-9): High Risk (05/15/2024)   Depression (PHQ2-9)    PHQ-2 Score: 14  Alcohol Screen: Low Risk (02/09/2024)   Alcohol Screen    Last Alcohol Screening Score (AUDIT): 2  Housing: Low Risk (02/09/2024)   Housing Stability Vital Sign    Unable to Pay for Housing in the Last Year: No    Number of Times Moved in the Last Year: 0    Homeless in the Last Year: No  Utilities: Not At Risk (02/09/2024)   AHC Utilities    Threatened with loss of utilities: No  Health Literacy: Adequate Health Literacy (10/11/2023)   B1300 Health Literacy    Frequency of  need for help with medical instructions: Never    Family History  Problem Relation Age of Onset   Diabetes Paternal Grandmother     Past Surgical History:  Procedure Laterality Date   APPLICATION OF WOUND VAC Right 07/17/2019   Procedure: Application Of Wound Vac;  Surgeon: Celena Sharper, MD;  Location: [facility] OR;  Service: Orthopedics;  Laterality: Right;   APPLICATION OF WOUND VAC Right 07/20/2019   Procedure: Application Of Wound Vac;  Surgeon: Celena Sharper, MD;  Location: [facility] OR;  Service: Orthopedics;  Laterality: Right;   APPLICATION OF WOUND VAC Right 07/27/2019   Procedure: Application Of Wound Vac;  Surgeon: Kendal Franky SQUIBB, MD;  Location: [facility] OR;  Service: Orthopedics;  Laterality: Right;   HERNIA REPAIR     As a baby   I & D EXTREMITY Right 07/27/2019   Procedure: IRRIGATION AND DEBRIDEMENT EXTREMITY Right Leg;  Surgeon: Kendal Franky SQUIBB, MD;  Location: [facility] OR;  Service: Orthopedics;  Laterality: Right;   I & D EXTREMITY Right 07/30/2019   Procedure: IRRIGATION AND DEBRIDEMENT EXTREMITY WITH WOUND VAC CHANGE;  Surgeon: Kendal Franky SQUIBB, MD;  Location: [facility] OR;  Service: Orthopedics;  Laterality: Right;  IRRIGATION AND DEBRIDEMENT EXTREMITY WITH WOUND VAC CHANGE   I & D EXTREMITY Right 08/08/2019   Procedure: IRRIGATION AND DEBRIDEMENT RIGHT EXTREMITY;   Surgeon: Kendal Franky SQUIBB, MD;  Location: [facility] OR;  Service: Orthopedics;  Laterality: Right;   INCISION AND DRAINAGE ABSCESS Right 07/09/2019   Procedure: INCISION AND DRAINAGE ABSCESS RIGHT HIP/PELVIS;  Surgeon: Kendal Franky SQUIBB, MD;  Location: [facility] OR;  Service: Orthopedics;  Laterality: Right;   INCISION AND DRAINAGE HIP Right 07/17/2019   Procedure: IRRIGATION AND DEBRIDEMENT HIP;  Surgeon: Celena Sharper, MD;  Location: [facility] OR;  Service: Orthopedics;  Laterality: Right;   INCISION AND DRAINAGE HIP Right 07/20/2019   Procedure: Irrigation And Debridement Hip;  Surgeon: Celena Sharper, MD;  Location: [facility];  Service: Orthopedics;  Laterality: Right;   TEE WITHOUT CARDIOVERSION N/A 07/11/2019   Procedure: TRANSESOPHAGEAL ECHOCARDIOGRAM (TEE);  Surgeon: Francyne Headland, MD;  Location: [facility];  Service: Cardiovascular;  Laterality: N/A;    ROS: Review of Systems Negative except as stated above  PHYSICAL EXAM: BP (!) 147/89 (BP Location: Left Arm, Patient Position: Sitting, Cuff Size: Normal)   Pulse 84   Temp 98.1 F (36.7 C) (Oral)   Ht 6' 6 (1.981 m)   Wt (!) 436 lb (197.8 kg)   SpO2 92%   BMI 50.38 kg/m   Wt Readings from Last 3 Encounters:  10/16/24 (!) 436 lb (197.8 kg)  05/15/24 (!) 428 lb (194.1 kg)  02/09/24 (!) 434 lb (196.9 kg)    Physical Exam  {male adult master:310786} {male adult master:310785}     Latest Ref Rng & Units 10/14/2023   11:42 AM 11/26/2022    4:54 PM 07/16/2021    4:38 PM  CMP  Glucose 70 - 99 mg/dL 859  889  769   BUN 6 - 24 mg/dL 9  12  12    Creatinine 0.76 - 1.27 mg/dL 9.10  9.08  9.18   Sodium 134 - 144 mmol/L 142  141  139   Potassium 3.5 - 5.2 mmol/L 4.6  4.5  4.1   Chloride 96 - 106 mmol/L 102  101  97   CO2 20 - 29 mmol/L 24  25  26    Calcium  8.7 - 10.2 mg/dL 9.6  9.8  9.7   Total Protein 6.0 -  8.5 g/dL 7.1  7.5  7.4   Total Bilirubin 0.0 - 1.2 mg/dL 0.4  0.4  0.4   Alkaline Phos 44 - 121 IU/L 72  91  100   AST 0 - 40 IU/L 30  18  35    ALT 0 - 44 IU/L 44  22  50    Lipid Panel     Component Value Date/Time   CHOL 139 10/14/2023 1142   TRIG 165 (H) 10/14/2023 1142   HDL 38 (L) 10/14/2023 1142   CHOLHDL 3.7 10/14/2023 1142   CHOLHDL 6.0 07/28/2019 0553   VLDL 26 07/28/2019 0553   LDLCALC 73 10/14/2023 1142    CBC    Component Value Date/Time   WBC 7.1 10/14/2023 1142   WBC 4.0 08/27/2019 0344   RBC 5.24 10/14/2023 1142   RBC 2.75 (L) 08/27/2019 0344   HGB 16.0 10/14/2023 1142   HCT 47.7 10/14/2023 1142   PLT 193 10/14/2023 1142   MCV 91 10/14/2023 1142   MCH 30.5 10/14/2023 1142   MCH 27.3 08/27/2019 0344   MCHC 33.5 10/14/2023 1142   MCHC 29.1 (L) 08/27/2019 0344   RDW 12.7 10/14/2023 1142   LYMPHSABS 1.5 08/27/2019 0344   MONOABS 0.5 08/27/2019 0344   EOSABS 0.3 08/27/2019 0344   BASOSABS 0.0 08/27/2019 0344    ASSESSMENT AND PLAN:  Assessment and Plan Assessment & Plan Type 2 diabetes mellitus with poor glycemic control A1c increased to 10.7. Blood sugar 310 mg/dL. Current regimen ineffective. Neuropathy symptoms suggest diabetic polyneuropathy. - Switched to Lantus  insulin  45 units at bedtime. - Added Novolog  insulin  5 units with lunch and dinner. - Increase Lantus  to 50 units if morning blood sugars >150 mg/dL after 3 days. - Follow-up with clinical pharmacist in one month.  Morbid obesity Weight gain despite dietary modifications. Ozempic  perceived as ineffective. - Encouraged continued dietary modifications and weight loss efforts.  Hypertension Blood pressure 150/95 mmHg. Non-compliance with hydralazine  regimen. - Instructed to take afternoon dose of hydralazine . - Follow-up with clinical pharmacist in one month.  Hyperlipidemia Overdue for cholesterol, kidney, liver function tests, and urine test for protein. - Ordered cholesterol, kidney, liver function tests, and urine test for protein.  Suspected diabetic polyneuropathy Symptoms suggestive of neuropathy. Previous gabapentin   trial ineffective. - Consider Lyrica or Cymbalta  if symptoms persist.  History of basal cell carcinoma of nose Post-Mohs procedure swelling noted. No other issues. - Continue monitoring for changes or new symptoms.  General health maintenance Received flu shot. Plans for eye exam and glasses replacement. - Ensure eye exam results update health records.     1. Type 2 diabetes mellitus with morbid obesity (HCC) (Primary) *** - POCT glucose (manual entry) - POCT glycosylated hemoglobin (Hb A1C)  2. Need for immunization against influenza *** - Flu vaccine trivalent PF, 6mos and older(Flulaval,Afluria,Fluarix,Fluzone)  3. Type 2 diabetes mellitus without complication, with long-term current use of insulin  (HCC) ***  4. Hypertension associated with diabetes [facility]) ***    Patient was given the opportunity to ask questions.  Patient verbalized understanding of the plan and was able to repeat key elements of the plan.   This documentation was completed using Paediatric nurse.  Any transcriptional errors are unintentional.  Orders Placed This Encounter  Procedures   Flu vaccine trivalent PF, 6mos and older(Flulaval,Afluria,Fluarix,Fluzone)   POCT glucose (manual entry)   POCT glycosylated hemoglobin (Hb A1C)     Requested Prescriptions   Pending Prescriptions Disp Refills  glucose blood (TRUE METRIX BLOOD GLUCOSE TEST) test strip 100 each 0    Sig: Use as instructed   hydrALAZINE  (APRESOLINE ) 100 MG tablet 270 tablet 1    Sig: Take 1 tablet (100 mg total) by mouth 3 (three) times daily.   losartan  (COZAAR ) 50 MG tablet 90 tablet 2    Sig: Take 1 tablet (50 mg total) by mouth daily.    No follow-ups on file.  Barnie Louder, MD, FACP    [1]  Current Outpatient Medications on File Prior to Visit  Medication Sig Dispense Refill   acetaminophen  (TYLENOL ) 325 MG tablet Take 2 tablets (650 mg total) by mouth every 6 (six) hours as needed for mild  pain.     albuterol  (PROVENTIL  HFA) 108 (90 Base) MCG/ACT inhaler Inhale 1 puff into the lungs every 6 (six) hours as needed for wheezing or shortness of breath. 6.7 g 1   atorvastatin  (LIPITOR) 20 MG tablet Take 1 tablet (20 mg total) by mouth daily.Must have office visit for refills. 90 tablet 2   Blood Glucose Monitoring Suppl (TRUE METRIX METER) w/Device KIT Use as directed 1 kit 0   glucose blood (TRUE METRIX BLOOD GLUCOSE TEST) test strip Use as instructed 100 each 0   hydrALAZINE  (APRESOLINE ) 100 MG tablet Take 1 tablet (100 mg total) by mouth 3 (three) times daily. 270 tablet 1   insulin  NPH Human (HUMULIN  N) 100 UNIT/ML injection Inject 0.28 mLs (28 Units total) into the skin every morning AND 0.25 mLs (25 Units total) daily in the afternoon. 30 mL 6   Insulin  Syringe-Needle U-100 31G X 15/64 1 ML MISC Use as directed 100 each 4   losartan  (COZAAR ) 50 MG tablet Take 1 tablet (50 mg total) by mouth daily. 90 tablet 2   metFORMIN  (GLUCOPHAGE -XR) 500 MG 24 hr tablet Take 2 tablets (1,000 mg total) by mouth 2 (two) times daily. 180 tablet 2   Multiple Vitamin (THERA) TABS Take 2 tablets by mouth daily.     Semaglutide , 2 MG/DOSE, (OZEMPIC , 2 MG/DOSE,) 8 MG/3ML SOPN Inject 2 mg as directed once a week. 3 mL 6   triamcinolone  cream (KENALOG ) 0.1 % Use twice a day for up to two weeks for flares, or 3-4 times a week for maintenance. Do not use for more than half the days out of the month. Do not use on sensitive areas like the face/groin 80 g 3   TRUEplus Lancets 28G MISC Use as directed 100 each 4   No current facility-administered medications on file prior to visit.  [2]  Allergies Allergen Reactions   Lisinopril Cough   Toradol  [Ketorolac  Tromethamine ]     SOB, sweating. Tolerates Ibuprofen. Wife reports reaction may have been due to injection.

## 2024-10-17 ENCOUNTER — Encounter: Payer: Self-pay | Admitting: Internal Medicine

## 2024-10-18 ENCOUNTER — Ambulatory Visit: Payer: Self-pay | Admitting: Internal Medicine

## 2024-10-18 LAB — MICROALBUMIN / CREATININE URINE RATIO
Creatinine, Urine: 144.2 mg/dL
Microalb/Creat Ratio: 7 mg/g{creat} (ref 0–29)
Microalbumin, Urine: 10.8 ug/mL

## 2024-10-19 ENCOUNTER — Other Ambulatory Visit: Payer: Self-pay

## 2024-10-23 ENCOUNTER — Ambulatory Visit: Payer: Self-pay | Attending: Internal Medicine

## 2024-10-23 ENCOUNTER — Other Ambulatory Visit: Payer: Self-pay

## 2024-10-24 LAB — CBC
Hematocrit: 49.7 % (ref 37.5–51.0)
Hemoglobin: 16 g/dL (ref 13.0–17.7)
MCH: 30.7 pg (ref 26.6–33.0)
MCHC: 32.2 g/dL (ref 31.5–35.7)
MCV: 95 fL (ref 79–97)
Platelets: 159 x10E3/uL (ref 150–450)
RBC: 5.22 x10E6/uL (ref 4.14–5.80)
RDW: 12.3 % (ref 11.6–15.4)
WBC: 7 x10E3/uL (ref 3.4–10.8)

## 2024-10-24 LAB — LIPID PANEL
Chol/HDL Ratio: 3.9 ratio (ref 0.0–5.0)
Cholesterol, Total: 149 mg/dL (ref 100–199)
HDL: 38 mg/dL — ABNORMAL LOW
LDL Chol Calc (NIH): 81 mg/dL (ref 0–99)
Triglycerides: 174 mg/dL — ABNORMAL HIGH (ref 0–149)
VLDL Cholesterol Cal: 30 mg/dL (ref 5–40)

## 2024-10-24 LAB — COMPREHENSIVE METABOLIC PANEL WITH GFR
ALT: 34 IU/L (ref 0–44)
AST: 21 IU/L (ref 0–40)
Albumin: 3.8 g/dL (ref 3.8–4.9)
Alkaline Phosphatase: 89 IU/L (ref 47–123)
BUN/Creatinine Ratio: 14 (ref 9–20)
BUN: 12 mg/dL (ref 6–24)
Bilirubin Total: 0.6 mg/dL (ref 0.0–1.2)
CO2: 24 mmol/L (ref 20–29)
Calcium: 9.6 mg/dL (ref 8.7–10.2)
Chloride: 94 mmol/L — ABNORMAL LOW (ref 96–106)
Creatinine, Ser: 0.87 mg/dL (ref 0.76–1.27)
Globulin, Total: 3 g/dL (ref 1.5–4.5)
Glucose: 329 mg/dL — ABNORMAL HIGH (ref 70–99)
Potassium: 5 mmol/L (ref 3.5–5.2)
Sodium: 135 mmol/L (ref 134–144)
Total Protein: 6.8 g/dL (ref 6.0–8.5)
eGFR: 102 mL/min/1.73

## 2024-11-13 ENCOUNTER — Other Ambulatory Visit: Payer: Self-pay

## 2024-11-13 ENCOUNTER — Ambulatory Visit: Payer: Self-pay | Attending: Internal Medicine | Admitting: Pharmacist

## 2024-11-13 ENCOUNTER — Encounter: Payer: Self-pay | Admitting: Pharmacist

## 2024-11-13 DIAGNOSIS — E119 Type 2 diabetes mellitus without complications: Secondary | ICD-10-CM

## 2024-11-13 DIAGNOSIS — Z7984 Long term (current) use of oral hypoglycemic drugs: Secondary | ICD-10-CM

## 2024-11-13 DIAGNOSIS — Z7985 Long-term (current) use of injectable non-insulin antidiabetic drugs: Secondary | ICD-10-CM

## 2024-11-13 DIAGNOSIS — E1165 Type 2 diabetes mellitus with hyperglycemia: Secondary | ICD-10-CM

## 2024-11-13 DIAGNOSIS — Z794 Long term (current) use of insulin: Secondary | ICD-10-CM

## 2024-11-13 DIAGNOSIS — E1169 Type 2 diabetes mellitus with other specified complication: Secondary | ICD-10-CM

## 2024-11-13 DIAGNOSIS — I10 Essential (primary) hypertension: Secondary | ICD-10-CM

## 2024-11-13 DIAGNOSIS — I152 Hypertension secondary to endocrine disorders: Secondary | ICD-10-CM

## 2024-11-13 MED ORDER — HYDRALAZINE HCL 100 MG PO TABS
100.0000 mg | ORAL_TABLET | Freq: Two times a day (BID) | ORAL | 1 refills | Status: AC
  Filled 2024-11-13: qty 180, 90d supply, fill #0

## 2024-11-13 MED ORDER — LANTUS SOLOSTAR 100 UNIT/ML ~~LOC~~ SOPN
50.0000 [IU] | PEN_INJECTOR | Freq: Every day | SUBCUTANEOUS | 3 refills | Status: AC
  Filled 2024-11-13: qty 15, 30d supply, fill #0

## 2024-11-13 MED ORDER — AMLODIPINE BESYLATE 5 MG PO TABS
5.0000 mg | ORAL_TABLET | Freq: Every day | ORAL | 0 refills | Status: AC
  Filled 2024-11-13: qty 90, 90d supply, fill #0

## 2024-11-13 MED ORDER — INSULIN LISPRO (1 UNIT DIAL) 100 UNIT/ML (KWIKPEN)
16.0000 [IU] | PEN_INJECTOR | Freq: Two times a day (BID) | SUBCUTANEOUS | 3 refills | Status: AC
  Filled 2024-11-13: qty 12, 38d supply, fill #0

## 2024-11-13 NOTE — Progress Notes (Signed)
 "  S:    PCP: Dr. Vicci  Patient presents for diabetes and HTN evaluation, education, and management. Patient was referred and last seen by Primary Care Provider on 10/16/2024. I have not see him since 2023.   With Dr. Vicci, pt's BP was 150/95 mmHg. His A1c was 10.7, up from 9.8% prior.  At the time of that appt, he was on Ozempic  2 mg weekly, NPH insulin  28u in the morning and 25 units in the evening, and metformin  1000 mg BID. His NPH was changed to Lantus  + Humalog . Regarding his BP, he had not taken his 2nd hydralazine  dose prior to that appt. Dr. Vicci held off on changes to his antihypertensives and instructed him to return for a BP check. .   Today, patient reports that he is doing okay but is frustrated about his home CBG readings. He endorses readings continuing in the 300s despite changing insulins at last visit. He stopped Ozempic  as he attributed it to causing weight gain. He is taking 50 units of Lantus  once daily. Takes Novolog  BID but takes 12 units instead of 5. Endorses polyuria and polydipsia.   Regarding his BP, he did not take his 2nd dose of hydralazine . Is adherent to his hydralazine  TID, just didn't take his second dose today. He is adherent to the losartan  once daily.   Family/Social History:  -Family Hx: DM in grandmother -Tobacco: Never smoker  Merchant navy officer affordability: None  Medication adherence reported.  Current diabetes medications include: Lantus  50 units daily, Humalog  5 units TID before meals (takes 12 units BID) metformin  500 mg XR - takes 1000 mg (2 tablets) BID  Patient denies hypoglycemic events.  Patient reported dietary habits: Eats 3 meals/day - Denies any changes in his diet since last visit  Patient-reported exercise habits:  -Plays paintball almost daily. Works as a psychologist, occupational, financial trader at [employer]:   [place] Value Date   HGBA1C 10.7 (A) 10/16/2024   There were no vitals filed for this  visit.  Lipid Panel     Component Value Date/Time   CHOL 149 10/23/2024 1047   TRIG 174 (H) 10/23/2024 1047   HDL 38 (L) 10/23/2024 1047   CHOLHDL 3.9 10/23/2024 1047   CHOLHDL 6.0 07/28/2019 0553   VLDL 26 07/28/2019 0553   LDLCALC 81 10/23/2024 1047   Clinical Atherosclerotic Cardiovascular Disease (ASCVD): No  The 10-year ASCVD risk score (Arnett DK, et al., 2019) is: 14.5%   Values used to calculate the score:     Age: 54 years     Clinically relevant sex: Male     Is Non-Hispanic African American: No     Diabetic: Yes     Tobacco smoker: No     Systolic Blood Pressure: 150 mmHg     Is BP treated: Yes     HDL Cholesterol: 38 mg/dL     Total Cholesterol: 149 mg/dL   A/P:  Diabetes longstanding currently uncontrolled. He is endorsing symptoms of hyperglycemia. Adherent to insulin  but takes more Novolog  than prescribed. Patient is not currently hypoglycemic but is able to verbalize appropriate hypoglycemia management plan. Medication adherence appears appropriate. -INCREASE Humalog  to 16 units BID. Advised patient to take 15 minutes prior to meals. After 1 week, he may increase to 20 units BID before meals if home sugar readings remain elevated. -Continue Lantus  50 units daily.  -Continue metformin  1000 mg BID.  -Extensively discussed pathophysiology of diabetes, recommended lifestyle interventions, dietary effects on blood  sugar control -Counseled on s/sx of and management of hypoglycemia -Next A1C anticipated 12/2024.  Hypertension diagnosed currently above goal on current medications. BP goal < 130/80 mmHg. Medication adherence appears to be okay. Recommend to add amlodipine . Will continue losartan . I have advised him to decrease hydralazine  to 100 mg BID.  -Started amlodipine  5 mg daily. -Decrease hydralazine  to 100 mg BID.   -Continued losartan  50 mg daily.  -Patient educated on purpose, proper use, and potential adverse effects of amlodipine .  -F/u labs ordered - none  today -Counseled on lifestyle modifications for blood pressure control including reduced dietary sodium, increased exercise, adequate sleep. -Encouraged patient to check BP at home and bring log of readings to next visit. Counseled on proper use of home BP cuff.    Total time in face to face counseling 30 minutes.  Herlene Fleeta Morris, PharmD, JAQUELINE, CPP Clinical Pharmacist [facility] [phone]   "

## 2024-11-19 ENCOUNTER — Other Ambulatory Visit: Payer: Self-pay

## 2024-11-22 ENCOUNTER — Other Ambulatory Visit: Payer: Self-pay

## 2024-11-26 ENCOUNTER — Other Ambulatory Visit: Payer: Self-pay

## 2024-11-29 ENCOUNTER — Other Ambulatory Visit: Payer: Self-pay

## 2024-11-30 ENCOUNTER — Other Ambulatory Visit: Payer: Self-pay

## 2024-12-28 ENCOUNTER — Ambulatory Visit: Payer: Self-pay | Admitting: Pharmacist

## 2025-02-14 ENCOUNTER — Ambulatory Visit: Payer: Self-pay | Admitting: Internal Medicine
